# Patient Record
Sex: Male | Born: 1955 | Race: Black or African American | Hispanic: No | Marital: Single | Smoking: Former smoker
Health system: Southern US, Community
[De-identification: ages and names within clinical notes are randomized; demographics above are authoritative.]

## PROBLEM LIST (undated history)

## (undated) DIAGNOSIS — C801 Malignant (primary) neoplasm, unspecified: Secondary | ICD-10-CM

## (undated) DIAGNOSIS — J449 Chronic obstructive pulmonary disease, unspecified: Secondary | ICD-10-CM

## (undated) DIAGNOSIS — Z789 Other specified health status: Secondary | ICD-10-CM

## (undated) HISTORY — PX: NO PAST SURGERIES: SHX2092

---

## 1999-03-15 ENCOUNTER — Encounter: Payer: Self-pay | Admitting: Emergency Medicine

## 1999-03-15 ENCOUNTER — Emergency Department (HOSPITAL_COMMUNITY): Admission: EM | Admit: 1999-03-15 | Discharge: 1999-03-15 | Payer: Self-pay | Admitting: Emergency Medicine

## 2001-07-26 ENCOUNTER — Inpatient Hospital Stay (HOSPITAL_COMMUNITY): Admission: EM | Admit: 2001-07-26 | Discharge: 2001-08-05 | Payer: Self-pay

## 2008-10-28 ENCOUNTER — Emergency Department (HOSPITAL_COMMUNITY): Admission: EM | Admit: 2008-10-28 | Discharge: 2008-10-28 | Payer: Self-pay | Admitting: Emergency Medicine

## 2010-06-10 LAB — URINALYSIS, MICROSCOPIC ONLY
Bilirubin Urine: NEGATIVE
Glucose, UA: NEGATIVE mg/dL
Hgb urine dipstick: NEGATIVE
Ketones, ur: NEGATIVE mg/dL
Leukocytes, UA: NEGATIVE
Nitrite: NEGATIVE
Protein, ur: NEGATIVE mg/dL
Specific Gravity, Urine: 1.007 (ref 1.005–1.030)
Urobilinogen, UA: 1 mg/dL (ref 0.0–1.0)
pH: 5.5 (ref 5.0–8.0)

## 2010-06-10 LAB — RAPID URINE DRUG SCREEN, HOSP PERFORMED
Amphetamines: NOT DETECTED
Barbiturates: NOT DETECTED
Benzodiazepines: NOT DETECTED
Cocaine: POSITIVE — AB

## 2010-06-10 LAB — ETHANOL: Alcohol, Ethyl (B): 244 mg/dL — ABNORMAL HIGH (ref 0–10)
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
## 2019-04-17 ENCOUNTER — Other Ambulatory Visit: Payer: Self-pay

## 2019-04-17 ENCOUNTER — Emergency Department: Payer: Medicaid Other

## 2019-04-17 ENCOUNTER — Encounter: Payer: Self-pay | Admitting: Emergency Medicine

## 2019-04-17 ENCOUNTER — Inpatient Hospital Stay
Admission: EM | Admit: 2019-04-17 | Discharge: 2019-04-19 | DRG: 871 | Disposition: A | Discharge status: Home Health | Payer: Medicaid Other | Attending: Family Medicine | Admitting: Family Medicine

## 2019-04-17 DIAGNOSIS — A419 Sepsis, unspecified organism: Principal | ICD-10-CM | POA: Diagnosis present

## 2019-04-17 DIAGNOSIS — E43 Unspecified severe protein-calorie malnutrition: Secondary | ICD-10-CM | POA: Diagnosis present

## 2019-04-17 DIAGNOSIS — Z720 Tobacco use: Secondary | ICD-10-CM

## 2019-04-17 DIAGNOSIS — R0902 Hypoxemia: Secondary | ICD-10-CM

## 2019-04-17 DIAGNOSIS — J189 Pneumonia, unspecified organism: Secondary | ICD-10-CM | POA: Diagnosis present

## 2019-04-17 DIAGNOSIS — F1010 Alcohol abuse, uncomplicated: Secondary | ICD-10-CM | POA: Diagnosis present

## 2019-04-17 DIAGNOSIS — R6520 Severe sepsis without septic shock: Secondary | ICD-10-CM | POA: Diagnosis not present

## 2019-04-17 DIAGNOSIS — E872 Acidosis, unspecified: Secondary | ICD-10-CM | POA: Insufficient documentation

## 2019-04-17 DIAGNOSIS — J449 Chronic obstructive pulmonary disease, unspecified: Secondary | ICD-10-CM | POA: Insufficient documentation

## 2019-04-17 DIAGNOSIS — R05 Cough: Secondary | ICD-10-CM | POA: Diagnosis not present

## 2019-04-17 DIAGNOSIS — Z7289 Other problems related to lifestyle: Secondary | ICD-10-CM

## 2019-04-17 DIAGNOSIS — J441 Chronic obstructive pulmonary disease with (acute) exacerbation: Secondary | ICD-10-CM | POA: Diagnosis present

## 2019-04-17 DIAGNOSIS — Z23 Encounter for immunization: Secondary | ICD-10-CM

## 2019-04-17 DIAGNOSIS — F1410 Cocaine abuse, uncomplicated: Secondary | ICD-10-CM | POA: Diagnosis present

## 2019-04-17 DIAGNOSIS — J9601 Acute respiratory failure with hypoxia: Secondary | ICD-10-CM | POA: Diagnosis present

## 2019-04-17 DIAGNOSIS — R069 Unspecified abnormalities of breathing: Secondary | ICD-10-CM | POA: Diagnosis not present

## 2019-04-17 DIAGNOSIS — Z79899 Other long term (current) drug therapy: Secondary | ICD-10-CM

## 2019-04-17 DIAGNOSIS — Z87891 Personal history of nicotine dependence: Secondary | ICD-10-CM

## 2019-04-17 DIAGNOSIS — Z20822 Contact with and (suspected) exposure to covid-19: Secondary | ICD-10-CM | POA: Diagnosis present

## 2019-04-17 DIAGNOSIS — T68XXXA Hypothermia, initial encounter: Secondary | ICD-10-CM

## 2019-04-17 DIAGNOSIS — R0602 Shortness of breath: Secondary | ICD-10-CM | POA: Diagnosis not present

## 2019-04-17 HISTORY — DX: Other specified health status: Z78.9

## 2019-04-17 LAB — LACTIC ACID, PLASMA
Lactic Acid, Venous: 2 mmol/L (ref 0.5–1.9)
Lactic Acid, Venous: 1.4 mmol/L (ref 0.5–1.9)

## 2019-04-17 LAB — COMPREHENSIVE METABOLIC PANEL
ALT: 11 U/L (ref 0–44)
AST: 22 U/L (ref 15–41)
Albumin: 3.8 g/dL (ref 3.5–5.0)
Alkaline Phosphatase: 77 U/L (ref 38–126)
Anion gap: 16 — ABNORMAL HIGH (ref 5–15)
BUN: 10 mg/dL (ref 8–23)
CO2: 24 mmol/L (ref 22–32)
Calcium: 9.3 mg/dL (ref 8.9–10.3)
Chloride: 98 mmol/L (ref 98–111)
Creatinine, Ser: 0.85 mg/dL (ref 0.61–1.24)
GFR calc Af Amer: >60 mL/min (ref >60)
GFR calc non Af Amer: >60 mL/min (ref >60)
Glucose, Bld: 98 mg/dL (ref 70–99)
Potassium: 3.6 mmol/L (ref 3.5–5.1)
Sodium: 138 mmol/L (ref 135–145)
Total Bilirubin: 0.9 mg/dL (ref 0.3–1.2)
Total Protein: 8.1 g/dL (ref 6.5–8.1)

## 2019-04-17 LAB — CBC
HCT: 44.2 % (ref 39.0–52.0)
Hemoglobin: 14.8 g/dL (ref 13.0–17.0)
MCH: 28.9 pg (ref 26.0–34.0)
MCHC: 33.5 g/dL (ref 30.0–36.0)
MCV: 86.3 fL (ref 80.0–100.0)
Platelets: 312 10*3/uL (ref 150–400)
RBC: 5.12 MIL/uL (ref 4.22–5.81)
RDW: 12.7 % (ref 11.5–15.5)
WBC: 6.4 10*3/uL (ref 4.0–10.5)
nRBC: 0 % (ref 0.0–0.2)

## 2019-04-17 LAB — TROPONIN I (HIGH SENSITIVITY)
Troponin I (High Sensitivity): 3 ng/L (ref <18)
Troponin I (High Sensitivity): <2 ng/L (ref <18)

## 2019-04-17 LAB — POC SARS CORONAVIRUS 2 AG: SARS Coronavirus 2 Ag: NEGATIVE

## 2019-04-17 LAB — PHOSPHORUS: Phosphorus: 2.8 mg/dL (ref 2.5–4.6)

## 2019-04-17 LAB — TSH: TSH: 2.711 u[IU]/mL (ref 0.350–4.500)

## 2019-04-17 LAB — MAGNESIUM: Magnesium: 1.6 mg/dL — ABNORMAL LOW (ref 1.7–2.4)

## 2019-04-17 LAB — BRAIN NATRIURETIC PEPTIDE: B Natriuretic Peptide: 49 pg/mL (ref 0.0–100.0)

## 2019-04-17 MED ORDER — NICOTINE 14 MG/24HR TD PT24
14.0000 mg | MEDICATED_PATCH | Freq: Every day | TRANSDERMAL | Status: DC
  Filled 2019-04-17: qty 1

## 2019-04-17 MED ORDER — THIAMINE HCL 100 MG/ML IJ SOLN: 100.0000 mg | Freq: Every day | INTRAMUSCULAR | Status: DC

## 2019-04-17 MED ORDER — LORAZEPAM 1 MG PO TABS: 1.0000 mg | ORAL_TABLET | ORAL | Status: DC | PRN

## 2019-04-17 MED ORDER — ACETAMINOPHEN 650 MG RE SUPP: 650.0000 mg | Freq: Four times a day (QID) | RECTAL | Status: DC | PRN

## 2019-04-17 MED ORDER — AMMONIUM LACTATE 12 % EX LOTN
TOPICAL_LOTION | Freq: Two times a day (BID) | CUTANEOUS | Status: DC
  Administered 2019-04-19: 1 via TOPICAL
  Filled 2019-04-17: qty 400

## 2019-04-17 MED ORDER — ONDANSETRON HCL 4 MG/2ML IJ SOLN
4.0000 mg | Freq: Four times a day (QID) | INTRAMUSCULAR | Status: DC | PRN
  Administered 2019-04-17: 4 mg via INTRAVENOUS
  Filled 2019-04-17: qty 2

## 2019-04-17 MED ORDER — SODIUM CHLORIDE 0.9 % IV BOLUS
1000.0000 mL | Freq: Once | INTRAVENOUS | Status: AC
  Administered 2019-04-17: 1000 mL via INTRAVENOUS

## 2019-04-17 MED ORDER — THIAMINE HCL 100 MG PO TABS: 100.0000 mg | ORAL_TABLET | Freq: Every day | ORAL | Status: DC

## 2019-04-17 MED ORDER — IPRATROPIUM-ALBUTEROL 0.5-2.5 (3) MG/3ML IN SOLN
3.0000 mL | Freq: Four times a day (QID) | RESPIRATORY_TRACT | Status: DC
  Administered 2019-04-17 – 2019-04-19 (×5): 3 mL via RESPIRATORY_TRACT
  Filled 2019-04-17 (×7): qty 3

## 2019-04-17 MED ORDER — SODIUM CHLORIDE 0.9 % IV SOLN
2.0000 g | INTRAVENOUS | Status: DC
  Administered 2019-04-17: 2 g via INTRAVENOUS
  Filled 2019-04-17 (×2): qty 20

## 2019-04-17 MED ORDER — SODIUM CHLORIDE 0.9 % IV SOLN
2.0000 g | Freq: Once | INTRAVENOUS | Status: AC
  Administered 2019-04-17: 14:00:00 2 g via INTRAVENOUS
  Filled 2019-04-17: qty 2

## 2019-04-17 MED ORDER — AZITHROMYCIN 250 MG PO TABS
250.0000 mg | ORAL_TABLET | Freq: Every day | ORAL | Status: DC
  Administered 2019-04-18 – 2019-04-19 (×2): 250 mg via ORAL
  Filled 2019-04-17 (×2): qty 1

## 2019-04-17 MED ORDER — IPRATROPIUM-ALBUTEROL 0.5-2.5 (3) MG/3ML IN SOLN
3.0000 mL | Freq: Once | RESPIRATORY_TRACT | Status: AC
  Administered 2019-04-17: 3 mL via RESPIRATORY_TRACT
  Filled 2019-04-17: qty 3

## 2019-04-17 MED ORDER — VANCOMYCIN HCL 1750 MG/350ML IV SOLN
1750.0000 mg | Freq: Once | INTRAVENOUS | Status: AC
  Administered 2019-04-17: 1750 mg via INTRAVENOUS
  Filled 2019-04-17: qty 350

## 2019-04-17 MED ORDER — LORAZEPAM 2 MG/ML IJ SOLN: 1.0000 mg | INTRAMUSCULAR | Status: DC | PRN

## 2019-04-17 MED ORDER — ENOXAPARIN SODIUM 40 MG/0.4ML ~~LOC~~ SOLN
40.0000 mg | SUBCUTANEOUS | Status: DC
  Administered 2019-04-17 – 2019-04-18 (×2): 40 mg via SUBCUTANEOUS
  Filled 2019-04-17 (×2): qty 0.4

## 2019-04-17 MED ORDER — ADULT MULTIVITAMIN W/MINERALS CH: 1.0000 | ORAL_TABLET | Freq: Every day | ORAL | Status: DC

## 2019-04-17 MED ORDER — ONDANSETRON HCL 4 MG PO TABS: 4.0000 mg | ORAL_TABLET | Freq: Four times a day (QID) | ORAL | Status: DC | PRN

## 2019-04-17 MED ORDER — AZITHROMYCIN 500 MG PO TABS
500.0000 mg | ORAL_TABLET | Freq: Every day | ORAL | Status: AC
  Administered 2019-04-17: 500 mg via ORAL
  Filled 2019-04-17: qty 1

## 2019-04-17 MED ORDER — FOLIC ACID 1 MG PO TABS: 1.0000 mg | ORAL_TABLET | Freq: Every day | ORAL | Status: DC

## 2019-04-17 MED ORDER — SODIUM CHLORIDE 0.9 % IV SOLN: INTRAVENOUS | Status: DC

## 2019-04-17 MED ORDER — METHYLPREDNISOLONE SODIUM SUCC 125 MG IJ SOLR
125.0000 mg | Freq: Once | INTRAMUSCULAR | Status: AC
  Administered 2019-04-17: 125 mg via INTRAVENOUS
  Filled 2019-04-17: qty 2

## 2019-04-17 MED ORDER — ACETAMINOPHEN 325 MG PO TABS: 650.0000 mg | ORAL_TABLET | Freq: Four times a day (QID) | ORAL | Status: DC | PRN

## 2019-04-17 MED ORDER — METHYLPREDNISOLONE SODIUM SUCC 40 MG IJ SOLR
40.0000 mg | Freq: Two times a day (BID) | INTRAMUSCULAR | Status: DC
  Administered 2019-04-18 – 2019-04-19 (×3): 40 mg via INTRAVENOUS
  Filled 2019-04-17 (×3): qty 1

## 2019-04-17 NOTE — ED Notes (Signed)
Pt given meal tray.

## 2019-04-17 NOTE — ED Notes (Signed)
Bear warmer turned down to low. Pt temp 98.3 oral.

## 2019-04-17 NOTE — ED Triage Notes (Signed)
Pt presents to ED via [facility] from home c/o sob and productive of white sputum x1 month. Current smoker, pt denies any other medical hx. States has been using mucinex at home with no symptom relief.   Unable to obtain oral or axillary temp.

## 2019-04-17 NOTE — ED Notes (Signed)
This RN ambulated pt per MD order. Upon standing pt unsteady. This RN provided assistance. Pt stating he does not use an assistive device at home. While ambulating pt oxygen saturation dropped to 85-88%. Pt placed back in bed. Oxygen saturation increased to 95% once pt placed back in bed. MD made aware.

## 2019-04-17 NOTE — ED Notes (Signed)
Pt's mother, Baldo Ash: [phone].

## 2019-04-17 NOTE — ED Provider Notes (Signed)
[facility] Emergency Department Provider Note  ____________________________________________   First MD Initiated Contact with Patient 04/17/19 1217     (approximate)  I have reviewed the triage vital signs and the nursing notes.   HISTORY  Chief Complaint Shortness of Breath and Cough    HPI Devon Peterson is a 64 y.o. male here with shortness of breath and white sputum production.  The patient is a chronic smoker.  He reportedly has had cough and generalized weakness for the last month.  He has been trying over-the-counter medications such as Mucinex without relief.  He denies any known fevers.  He states that shortness of breath is worse with exertion.  He does not regularly use inhalers.  He states that he became more weak over the last 1 to 2 days.  No chest pain.  Unable to obtain oral temperature on arrival.        History reviewed. No pertinent past medical history.  There are no problems to display for this patient.   History reviewed. No pertinent surgical history.  Prior to Admission medications   Not on File    Allergies Patient has no known allergies.  History reviewed. No pertinent family history.  Social History Social History   Tobacco Use  . Smoking status: Current Every Day Smoker    Packs/day: 0.50    Types: Cigarettes  . Smokeless tobacco: Never Used  Substance Use Topics  . Alcohol use: Not on file  . Drug use: Not on file    Review of Systems  Review of Systems  Constitutional: Positive for fatigue. Negative for chills and fever.  HENT: Negative for sore throat.   Respiratory: Positive for cough, shortness of breath and wheezing.   Cardiovascular: Negative for chest pain.  Gastrointestinal: Positive for nausea. Negative for abdominal pain.  Genitourinary: Negative for flank pain.  Musculoskeletal: Negative for neck pain.  Skin: Negative for rash and wound.  Allergic/Immunologic: Negative for immunocompromised  state.  Neurological: Positive for weakness. Negative for numbness.  Hematological: Does not bruise/bleed easily.  All other systems reviewed and are negative.    ____________________________________________  PHYSICAL EXAM:      VITAL SIGNS: ED Triage Vitals  Enc Vitals Group     BP 04/17/19 1159 (!) 125/93     Pulse Rate 04/17/19 1159 (!) 126     Resp 04/17/19 1159 18     Temp 04/17/19 1212 (!) 94.5 F (34.7 C)     Temp Source 04/17/19 1212 Rectal     SpO2 04/17/19 1159 100 %     Weight 04/17/19 1149 156 lb (70.8 kg)     Height 04/17/19 1149 5\' 11"  (1.803 m)     Head Circumference --      Peak Flow --      Pain Score 04/17/19 1149 0     Pain Loc --      Pain Edu? --      Excl. in [place]? --      Physical Exam Vitals and nursing note reviewed.  Constitutional:      General: He is not in acute distress.    Appearance: He is well-developed.  HENT:     Head: Normocephalic and atraumatic.     Comments: Markedly dry mucous membranes Eyes:     Conjunctiva/sclera: Conjunctivae normal.  Cardiovascular:     Rate and Rhythm: Normal rate and regular rhythm.     Heart sounds: Normal heart sounds.  Pulmonary:  Effort: Tachypnea and respiratory distress present.     Breath sounds: Decreased air movement present. Examination of the right-upper field reveals wheezing. Examination of the left-upper field reveals wheezing. Examination of the right-middle field reveals wheezing. Examination of the left-middle field reveals wheezing. Examination of the right-lower field reveals wheezing. Examination of the left-lower field reveals wheezing. Wheezing present.  Abdominal:     General: There is no distension.  Musculoskeletal:     Cervical back: Neck supple.  Skin:    General: Skin is warm.     Capillary Refill: Capillary refill takes less than 2 seconds.     Findings: No rash.  Neurological:     Mental Status: He is alert and oriented to person, place, and time.     Motor: No  abnormal muscle tone.       ____________________________________________   LABS (all labs ordered are listed, but only abnormal results are displayed)  Labs Reviewed  COMPREHENSIVE METABOLIC PANEL - Abnormal; Notable for the following components:      Result Value   Anion gap 16 (*)    All other components within normal limits  LACTIC ACID, PLASMA - Abnormal; Notable for the following components:   Lactic Acid, Venous 2.0 (*)    All other components within normal limits  CULTURE, BLOOD (ROUTINE X 2)  CULTURE, BLOOD (ROUTINE X 2)  CBC  LACTIC ACID, PLASMA  TSH  BRAIN NATRIURETIC PEPTIDE  POC SARS CORONAVIRUS 2 AG -  ED  TROPONIN I (HIGH SENSITIVITY)  TROPONIN I (HIGH SENSITIVITY)    ____________________________________________  EKG: Sinus tachycardia, VR 123. PR 126, QRS 76, QTc 472. No acute St elevations or depressions.  ________________________________________  RADIOLOGY All imaging, including plain films, CT scans, and ultrasounds, independently reviewed by me, and interpretations confirmed via formal radiology reads.  ED MD interpretation:   CXR: COPD, emphysema, no acute abnormality  Official radiology report(s): DG Chest Portable 1 View  Result Date: 04/17/2019 CLINICAL DATA:  Shortness of breath. EXAM: PORTABLE CHEST 1 VIEW COMPARISON:  None. FINDINGS: No pneumothorax. The heart, hila, and mediastinum are normal. No nodules or masses are identified. No focal infiltrates. Hyper inflation of the lungs is noted. IMPRESSION: Hyperinflation of the lungs suggests COPD or emphysema. No acute abnormalities are seen. Electronically Signed   By: Dorise Bullion III M.D   On: 04/17/2019 12:34    ____________________________________________  PROCEDURES   Procedure(s) performed (including Critical Care):  Procedures  ____________________________________________  INITIAL IMPRESSION / MDM / [place] / ED COURSE  As part of my medical decision making, I  reviewed the following data within the [place] notes reviewed and incorporated, Old chart reviewed, Notes from prior ED visits, and [place] Controlled Substance Database       *Lakai Moree was evaluated in Emergency Department on 04/17/2019 for the symptoms described in the history of present illness. He was evaluated in the context of the global COVID-19 pandemic, which necessitated consideration that the patient might be at risk for infection with the SARS-CoV-2 virus that causes COVID-19. Institutional protocols and algorithms that pertain to the evaluation of patients at risk for COVID-19 are in a state of rapid change based on information released by regulatory bodies including the CDC and federal and state organizations. These policies and algorithms were followed during the patient's care in the ED.  Some ED evaluations and interventions may be delayed as a result of limited staffing during the pandemic.*     Medical  Decision Making:  64 yo M with h/o COPD here with cough, generalized weakness. Symptoms ongoing x 1 month but family was finally able to convince him to come today. Pt arrived hypothermic, hypoxic, and tachycardic - sepsis protocol started with broad-spectrum ABX. LA 2.0. IVF given. However, on further lab/assessment no apparent bacterial infection seen. CXR is clear. CMP unremarkable. He is diffusely wheezy and noted to have hypoxia to 85 when ambulating in ED.  Suspect COPD exacerbation with acute on chronic hypoxia, as well as failure to thrive. Steroids, breathing tx given.  ____________________________________________  FINAL CLINICAL IMPRESSION(S) / ED DIAGNOSES  Final diagnoses:  COPD exacerbation ([place])  Hypoxia     MEDICATIONS GIVEN DURING THIS VISIT:  Medications  vancomycin (VANCOREADY) IVPB 1750 mg/350 mL (1,750 mg Intravenous New Bag/Given 04/17/19 1433)  ipratropium-albuterol (DUONEB) 0.5-2.5 (3) MG/3ML nebulizer solution 3 mL (has no  administration in time range)  sodium chloride 0.9 % bolus 1,000 mL (0 mLs Intravenous Stopped 04/17/19 1548)  ceFEPIme (MAXIPIME) 2 g in sodium chloride 0.9 % 100 mL IVPB (0 g Intravenous Stopped 04/17/19 1433)  ipratropium-albuterol (DUONEB) 0.5-2.5 (3) MG/3ML nebulizer solution 3 mL (3 mLs Nebulization Given 04/17/19 1434)  methylPREDNISolone sodium succinate (SOLU-MEDROL) 125 mg/2 mL injection 125 mg (125 mg Intravenous Given 04/17/19 1434)     ED Discharge Orders    None       Note:  This document was prepared using Dragon voice recognition software and may include unintentional dictation errors.   Duffy Bruce, MD 04/17/19 [phone]

## 2019-04-17 NOTE — ED Notes (Signed)
Room and bed cleaned of trash, pt reminded to keep mask on - cough present  Belongings bagged - pt's watch placed into single red glove, pt admits "I only have one [glove]"

## 2019-04-17 NOTE — ED Notes (Signed)
Attempted to call report to the floor, was told they are unable to accept him at this time because they do not have a tele box.
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
## 2019-04-18 ENCOUNTER — Inpatient Hospital Stay: Payer: Medicaid Other

## 2019-04-18 LAB — BASIC METABOLIC PANEL
Anion gap: 12 (ref 5–15)
BUN: 15 mg/dL (ref 8–23)
CO2: 21 mmol/L — ABNORMAL LOW (ref 22–32)
Calcium: 8.4 mg/dL — ABNORMAL LOW (ref 8.9–10.3)
Chloride: 104 mmol/L (ref 98–111)
Creatinine, Ser: 0.89 mg/dL (ref 0.61–1.24)
GFR calc Af Amer: >60 mL/min (ref >60)
GFR calc non Af Amer: >60 mL/min (ref >60)
Glucose, Bld: 226 mg/dL — ABNORMAL HIGH (ref 70–99)
Potassium: 3.9 mmol/L (ref 3.5–5.1)
Sodium: 137 mmol/L (ref 135–145)

## 2019-04-18 LAB — CBC
HCT: 34 % — ABNORMAL LOW (ref 39.0–52.0)
Hemoglobin: 11.6 g/dL — ABNORMAL LOW (ref 13.0–17.0)
MCH: 29.3 pg (ref 26.0–34.0)
MCHC: 34.1 g/dL (ref 30.0–36.0)
MCV: 85.9 fL (ref 80.0–100.0)
Platelets: 254 10*3/uL (ref 150–400)
RBC: 3.96 MIL/uL — ABNORMAL LOW (ref 4.22–5.81)
RDW: 12.8 % (ref 11.5–15.5)
WBC: 7.3 10*3/uL (ref 4.0–10.5)
nRBC: 0 % (ref 0.0–0.2)

## 2019-04-18 LAB — URINE DRUG SCREEN, QUALITATIVE (ARMC ONLY)
Amphetamines, Ur Screen: NOT DETECTED
Barbiturates, Ur Screen: NOT DETECTED
Benzodiazepine, Ur Scrn: NOT DETECTED
Cannabinoid 50 Ng, Ur ~~LOC~~: NOT DETECTED
Cocaine Metabolite,Ur ~~LOC~~: POSITIVE — AB
MDMA (Ecstasy)Ur Screen: NOT DETECTED
Methadone Scn, Ur: NOT DETECTED
Opiate, Ur Screen: NOT DETECTED
Phencyclidine (PCP) Ur S: NOT DETECTED
Tricyclic, Ur Screen: NOT DETECTED

## 2019-04-18 LAB — HIV ANTIBODY (ROUTINE TESTING W REFLEX): HIV Screen 4th Generation wRfx: NONREACTIVE

## 2019-04-18 LAB — PROCALCITONIN: Procalcitonin: <0.1 ng/mL

## 2019-04-18 LAB — TSH: TSH: 2.015 u[IU]/mL (ref 0.350–4.500)

## 2019-04-18 LAB — SARS CORONAVIRUS 2 (TAT 6-24 HRS): SARS Coronavirus 2: NEGATIVE

## 2019-04-18 MED ORDER — SODIUM CHLORIDE 0.9 % IV SOLN
1.0000 g | INTRAVENOUS | Status: DC
  Administered 2019-04-18: 1 g via INTRAVENOUS
  Filled 2019-04-18 (×2): qty 10

## 2019-04-18 MED ORDER — MAGNESIUM OXIDE 400 (241.3 MG) MG PO TABS
400.0000 mg | ORAL_TABLET | Freq: Two times a day (BID) | ORAL | Status: DC
  Administered 2019-04-18 – 2019-04-19 (×3): 400 mg via ORAL
  Filled 2019-04-18 (×3): qty 1

## 2019-04-18 MED ORDER — PNEUMOCOCCAL VAC POLYVALENT 25 MCG/0.5ML IJ INJ
0.5000 mL | INJECTION | INTRAMUSCULAR | Status: AC
  Administered 2019-04-19: 0.5 mL via INTRAMUSCULAR
  Filled 2019-04-18: qty 0.5

## 2019-04-18 MED ORDER — INFLUENZA VAC SPLIT QUAD 0.5 ML IM SUSY
0.5000 mL | PREFILLED_SYRINGE | INTRAMUSCULAR | Status: DC
  Filled 2019-04-18 (×2): qty 0.5

## 2019-04-18 NOTE — ED Notes (Signed)
Meal tray and drinks provided per pt request.

## 2019-04-18 NOTE — Progress Notes (Signed)
PROGRESS NOTE    Devon Peterson  [id number] DOB: 1955-08-24 DOA: 04/17/2019 PCP: Patient, No Pcp Per      Brief Narrative:  Mr. Cleckler is a 64 y.o. M with hx smoking, otherwise no regular medical follow up who presented with URI symptoms for several days, now severe cough, fatigue, chills.  In the ER, COVID negative, CXR clear.  Wheezing on exam.  Admitted and started on empiric antibiotics and steroids.        Assessment & Plan:  Possible sepsis -Continue antibiotics -Check procal   COPD exacerbation -Continue IV steroids -Continue Duo-nebs -Continue azithromycin   Hypomagnesemia -Replete Mag  COVID ruled out PCR negative, no infiltrates, no hypoxia.  Polysubstance abuse Cocaine positive UDS, endorsed alcohol use.  No withdrawal symptoms overnight. -Okay to forego withdrawal protocol  Severe protein calorie malnutrition As evidenced by poor oral intake, reported weight loss, and severe loss of subcutaneous muscle mass and fat         Disposition: The patient was admitted with hypothermia, tachycardia and lactic acid 2 in the setting of suspected pneumonia.    I will discharge when he is mentating at baseline, weaned off oxygen, heart rate and respiratory rate are normal, and his antibiotics have been changed to oral regimen safely. PSI 108, risk class IV has 8-9% in hospital mortality.          MDM: The below labs and imaging reports were reviewed and summarized above.  Medication management as above.     DVT prophylaxis: Lovenox Code Status: FULL Family Communication: Sister's phone number in the chart is not operational    Consultants:     Procedures:     Antimicrobials:   Ceftriaxone  Azithromycin   Culture data:   2/12 blood cultures           Subjective: Patient is feeling somewhat better, still very weak, tired.  His appetite is poor.  He has had no fever overnight.  He is a little wheezy still, still  coughing.  Objective: Vitals:   04/18/19 0230 04/18/19 0300 04/18/19 0404 04/18/19 0746  BP: 121/86 (!) 135/95 140/90   Pulse: 93 (!) 108 93   Resp: (!) 21 (!) 22 20   Temp:   98.6 F (37 C)   TempSrc:   Oral   SpO2: 100% 97% 100% 100%  Weight:   44.5 kg   Height:   5\' 11"  (1.803 m)     Intake/Output Summary (Last 24 hours) at 04/18/2019 1338 Last data filed at 04/18/2019 0500 Gross per 24 hour  Intake 691.4 ml  Output 400 ml  Net 291.4 ml   Filed Weights   04/17/19 1149 04/18/19 0404  Weight: 70.8 kg 44.5 kg    Examination: General appearance: Cachectic adult male, alert and in no acute distress.  Lying in bed HEENT: Anicteric, conjunctiva pink, lids and lashes normal. No nasal deformity, discharge, epistaxis.  Lips dry, oropharynx tacky dry, no oral lesions, edentulous, hearing normal.   Skin: Warm and dry.   No suspicious rashes or lesions. Cardiac: Tachycardic, regular, nl S1-S2, no murmurs appreciated.  Capillary refill is brisk.  JVP not visible.  No LE edema.  Radial pulses 2+ and symmetric. Respiratory: Normal respiratory rate and rhythm.  Diminished bilaterally, some inspiratory rhonchi, no rales. Abdomen: Abdomen soft.  Scaphoid, no TTP or guarding. No ascites, distension, hepatosplenomegaly.   MSK: No large joint effusions.  Diffuse severe loss of subcutaneous muscle mass intact, thenar wasting, temporal wasting.  Left  hand has some old injury/deformity. Neuro: Awake and alert.  EOMI, moves all extremities. Speech fluent.    Psych: Sensorium intact and responding to questions, attention normal. Affect blunted.  Judgment and insight appear normal.    Data Reviewed: I have personally reviewed following labs and imaging studies:  CBC: Recent Labs  Lab 04/17/19 1211 04/18/19 0603  WBC 6.4 7.3  HGB 14.8 11.6*  HCT 44.2 34.0*  MCV 86.3 85.9  PLT 312 604   Basic Metabolic Panel: Recent Labs  Lab 04/17/19 1211 04/18/19 0603  NA 138 137  K 3.6 3.9  CL 98  104  CO2 24 21*  GLUCOSE 98 226*  BUN 10 15  CREATININE 0.85 0.89  CALCIUM 9.3 8.4*  MG 1.6*  --   PHOS 2.8  --    GFR: Estimated Creatinine Clearance: 53.5 mL/min (by C-G formula based on SCr of 0.89 mg/dL). Liver Function Tests: Recent Labs  Lab 04/17/19 1211  AST 22  ALT 11  ALKPHOS 77  BILITOT 0.9  PROT 8.1  ALBUMIN 3.8   No results for input(s): LIPASE, AMYLASE in the last 168 hours. No results for input(s): AMMONIA in the last 168 hours. Coagulation Profile: No results for input(s): INR, PROTIME in the last 168 hours. Cardiac Enzymes: No results for input(s): CKTOTAL, CKMB, CKMBINDEX, TROPONINI in the last 168 hours. BNP (last 3 results) No results for input(s): PROBNP in the last 8760 hours. HbA1C: No results for input(s): HGBA1C in the last 72 hours. CBG: No results for input(s): GLUCAP in the last 168 hours. Lipid Profile: No results for input(s): CHOL, HDL, LDLCALC, TRIG, CHOLHDL, LDLDIRECT in the last 72 hours. Thyroid Function Tests: Recent Labs    04/18/19 0603  TSH 2.015   Anemia Panel: No results for input(s): VITAMINB12, FOLATE, FERRITIN, TIBC, IRON, RETICCTPCT in the last 72 hours. Urine analysis:    Component Value Date/Time   COLORURINE YELLOW 10/28/2008 [place] 10/28/2008 0458   LABSPEC 1.007 10/28/2008 0458   PHURINE 5.5 10/28/2008 0458   GLUCOSEU NEGATIVE 10/28/2008 0458   HGBUR NEGATIVE 10/28/2008 0458   BILIRUBINUR NEGATIVE 10/28/2008 0458   KETONESUR NEGATIVE 10/28/2008 0458   PROTEINUR NEGATIVE 10/28/2008 0458   UROBILINOGEN 1.0 10/28/2008 0458   NITRITE NEGATIVE 10/28/2008 0458   LEUKOCYTESUR NEGATIVE 10/28/2008 0458   Sepsis Labs: @LABRCNTIP (procalcitonin:4,lacticacidven:4)  ) Recent Results (from the past 240 hour(s))  Blood culture (routine x 2)     Status: None (Preliminary result)   Collection Time: 04/17/19 12:11 PM   Specimen: BLOOD  Result Value Ref Range Status   Specimen Description BLOOD BLOOD  LEFT FOREARM  Final   Special Requests   Final    BOTTLES DRAWN AEROBIC AND ANAEROBIC Blood Culture results may not be optimal due to an inadequate volume of blood received in culture bottles   Culture   Final    NO GROWTH < 24 HOURS Performed at [facility], [address] [place], [place] [address]    Report Status PENDING  Incomplete  Blood culture (routine x 2)     Status: None (Preliminary result)   Collection Time: 04/17/19 12:11 PM   Specimen: BLOOD  Result Value Ref Range Status   Specimen Description BLOOD LEFT ANTECUBITAL  Final   Special Requests   Final    BOTTLES DRAWN AEROBIC AND ANAEROBIC Blood Culture adequate volume   Culture   Final    NO GROWTH < 24 HOURS Performed at [facility], [address] [place], [place] [address]    Report Status PENDING  Incomplete  SARS CORONAVIRUS 2 (TAT 6-24 HRS) Nasopharyngeal Nasopharyngeal Swab     Status: None   Collection Time: 04/17/19  6:28 PM   Specimen: Nasopharyngeal Swab  Result Value Ref Range Status   SARS Coronavirus 2 NEGATIVE NEGATIVE Final    Comment: (NOTE) SARS-CoV-2 target nucleic acids are NOT DETECTED. The SARS-CoV-2 RNA is generally detectable in upper and lower respiratory specimens during the acute phase of infection. Negative results do not preclude SARS-CoV-2 infection, do not rule out co-infections with other pathogens, and should not be used as the sole basis for treatment or other patient management decisions. Negative results must be combined with clinical observations, patient history, and epidemiological information. The expected result is Negative. Fact Sheet for Patients: SugarRoll.be Fact Sheet for Healthcare Providers: https://www.woods-mathews.com/ This test is not yet approved or cleared by the [place] FDA and  has been authorized for detection and/or diagnosis of SARS-CoV-2 by FDA under an Emergency Use Authorization  (EUA). This EUA will remain  in effect (meaning this test can be used) for the duration of the COVID-19 declaration under Section 56 4(b)(1) of the Act, 21 U.S.C. section 360bbb-3(b)(1), unless the authorization is terminated or revoked sooner. Performed at [facility], [place] [address], [place], [place] [address]          Radiology Studies: DG Chest Port 1 View  Result Date: 04/18/2019 CLINICAL DATA:  Hypothermia. Shortness of breath and cough. History of smoking. EXAM: PORTABLE CHEST 1 VIEW COMPARISON:  04/17/2019 FINDINGS: The cardiomediastinal silhouette is unchanged with normal heart size. The lungs remain hyperinflated with similar interstitial coarsening. Nipple shadows are noted bilaterally. No acute airspace consolidation, edema, pleural effusion, pneumothorax is identified. IMPRESSION: COPD without evidence of acute cardiopulmonary process. Electronically Signed   By: Logan Bores M.D.   On: 04/18/2019 09:47   DG Chest Portable 1 View  Result Date: 04/17/2019 CLINICAL DATA:  Shortness of breath. EXAM: PORTABLE CHEST 1 VIEW COMPARISON:  None. FINDINGS: No pneumothorax. The heart, hila, and mediastinum are normal. No nodules or masses are identified. No focal infiltrates. Hyper inflation of the lungs is noted. IMPRESSION: Hyperinflation of the lungs suggests COPD or emphysema. No acute abnormalities are seen. Electronically Signed   By: Dorise Bullion III M.D   On: 04/17/2019 12:34        Scheduled Meds: . ammonium lactate   Topical BID  . azithromycin  250 mg Oral Daily  . enoxaparin (LOVENOX) injection  40 mg Subcutaneous Q24H  . [START ON 04/19/2019] influenza vac split quadrivalent PF  0.5 mL Intramuscular Tomorrow-1000  . ipratropium-albuterol  3 mL Nebulization Q6H  . magnesium oxide  400 mg Oral BID  . methylPREDNISolone (SOLU-MEDROL) injection  40 mg Intravenous Q12H  . nicotine  14 mg Transdermal Daily  . [START ON 04/19/2019] pneumococcal 23 valent vaccine   0.5 mL Intramuscular Tomorrow-1000   Continuous Infusions:    LOS: 1 day    Time spent: 25 minutes    Edwin Dada, MD [facility] 04/18/2019, 1:38 PM     Please page though [place] or Epic secure chat:  For [employer], Adult nurse

## 2019-04-18 NOTE — Progress Notes (Deleted)
Patient refused bed alarm. Educated patient on importance of bed alarm for fall prevention but patient is still refusing bed alarm at this time.  04/18/2019 2:04 PM Marry Guan, RN

## 2019-04-19 LAB — CBC
HCT: 33.3 % — ABNORMAL LOW (ref 39.0–52.0)
Hemoglobin: 11.5 g/dL — ABNORMAL LOW (ref 13.0–17.0)
MCH: 29.3 pg (ref 26.0–34.0)
MCHC: 34.5 g/dL (ref 30.0–36.0)
MCV: 84.9 fL (ref 80.0–100.0)
Platelets: 232 10*3/uL (ref 150–400)
RBC: 3.92 MIL/uL — ABNORMAL LOW (ref 4.22–5.81)
RDW: 12.6 % (ref 11.5–15.5)
WBC: 10 10*3/uL (ref 4.0–10.5)
nRBC: 0 % (ref 0.0–0.2)

## 2019-04-19 LAB — BASIC METABOLIC PANEL
Anion gap: 8 (ref 5–15)
BUN: 9 mg/dL (ref 8–23)
CO2: 27 mmol/L (ref 22–32)
Calcium: 8.6 mg/dL — ABNORMAL LOW (ref 8.9–10.3)
Chloride: 103 mmol/L (ref 98–111)
Creatinine, Ser: 0.79 mg/dL (ref 0.61–1.24)
GFR calc Af Amer: >60 mL/min (ref >60)
GFR calc non Af Amer: >60 mL/min (ref >60)
Glucose, Bld: 146 mg/dL — ABNORMAL HIGH (ref 70–99)
Potassium: 3.6 mmol/L (ref 3.5–5.1)
Sodium: 138 mmol/L (ref 135–145)

## 2019-04-19 LAB — PROCALCITONIN: Procalcitonin: <0.1 ng/mL

## 2019-04-19 MED ORDER — CEFDINIR 300 MG PO CAPS: 300.0000 mg | ORAL_CAPSULE | Freq: Two times a day (BID) | ORAL | 0 refills | Status: DC

## 2019-04-19 MED ORDER — AZITHROMYCIN 250 MG PO TABS: 250.0000 mg | ORAL_TABLET | Freq: Every day | ORAL | 0 refills | Status: DC

## 2019-04-19 MED ORDER — SODIUM CHLORIDE 0.9 % IV SOLN
1.0000 g | INTRAVENOUS | Status: DC
  Administered 2019-04-19: 1 g via INTRAVENOUS
  Filled 2019-04-19: qty 1

## 2019-04-19 MED ORDER — SODIUM CHLORIDE 0.9 % IV SOLN
INTRAVENOUS | Status: DC | PRN
  Administered 2019-04-19: 20 mL via INTRAVENOUS

## 2019-04-19 MED ORDER — PREDNISONE 20 MG PO TABS: 40.0000 mg | ORAL_TABLET | Freq: Every day | ORAL | 0 refills | Status: AC

## 2019-04-19 NOTE — Progress Notes (Signed)
Physical Therapy Evaluation Patient Details Name: Devon Peterson MRN: [id number] DOB: [birth year] Today's Date: 04/19/2019   History of Present Illness  er MD note:Monty Deleo  is a 64 y.o. male coming in with cough and not being able to sleep.  He states he had some chills and some weight loss and feeling very fatigued.  Also has a runny nose.  Clinical Impression  Patient agrees to PT evaluation. He requires MI for bed mobiltiy, MI with RW for sit to stand transfer, MI with RW for gait 60 feet . He lives alone and reports not using a RW previously. He has no deficits with seated and standing balance and his BLE is WLF. He will benefit from skilled PT to improve mobility and strength.   Follow Up Recommendations Home health PT    Equipment Recommendations  Rolling walker with 5" wheels    Recommendations for Other Services       Precautions / Restrictions Precautions Precautions: None Restrictions Weight Bearing Restrictions: No      Mobility  Bed Mobility Overal bed mobility: Modified Independent                Transfers Overall transfer level: Modified independent Equipment used: Rolling walker (2 wheeled)             General transfer comment: VC for hand placement  Ambulation/Gait Ambulation/Gait assistance: Modified independent (Device/Increase time) Gait Distance (Feet): 60 Feet Assistive device: Rolling walker (2 wheeled) Gait Pattern/deviations: Step-to pattern        Stairs            Wheelchair Mobility    Modified Rankin (Stroke Patients Only)       Balance Overall balance assessment: Modified Independent                                           Pertinent Vitals/Pain Pain Assessment: No/denies pain    Home Living Family/patient expects to be discharged to:: Private residence Living Arrangements: Alone Available Help at Discharge: Family Type of Home: House Home Access: Stairs to enter Entrance Stairs-Rails:  Right Entrance Stairs-Number of Steps: 3 Home Layout: One level        Prior Function Level of Independence: Independent               Hand Dominance        Extremity/Trunk Assessment        Lower Extremity Assessment Lower Extremity Assessment: Overall WFL for tasks assessed(3/ 5 BLE hip flex, knee ext)       Communication   Communication: No difficulties  Cognition Arousal/Alertness: Awake/alert Behavior During Therapy: WFL for tasks assessed/performed;Flat affect Overall Cognitive Status: Within Functional Limits for tasks assessed                                        General Comments      Exercises     Assessment/Plan    PT Assessment Patient needs continued PT services  PT Problem List Decreased strength;Decreased mobility       PT Treatment Interventions Gait training;Therapeutic activities;Therapeutic exercise    PT Goals (Current goals can be found in the Care Plan section)  Acute Rehab PT Goals Patient Stated Goal: no goals stated PT Goal Formulation: Patient unable to participate in goal setting  Time For Goal Achievement: 05/03/19 Potential to Achieve Goals: Good    Frequency Min 2X/week   Barriers to discharge        Co-evaluation               AM-PAC PT "6 Clicks" Mobility  Outcome Measure Help needed turning from your back to your side while in a flat bed without using bedrails?: A Little Help needed moving from lying on your back to sitting on the side of a flat bed without using bedrails?: A Little Help needed moving to and from a bed to a chair (including a wheelchair)?: A Little Help needed standing up from a chair using your arms (e.g., wheelchair or bedside chair)?: A Little Help needed to walk in hospital room?: A Little Help needed climbing 3-5 steps with a railing? : A Little 6 Click Score: 18    End of Session Equipment Utilized During Treatment: Gait belt Activity Tolerance: Patient  tolerated treatment well Patient left: in bed;with bed alarm set Nurse Communication: Mobility status PT Visit Diagnosis: Muscle weakness (generalized) (M62.81);Difficulty in walking, not elsewhere classified (R26.2)    Time: 2563-8937 PT Time Calculation (min) (ACUTE ONLY): 20 min   Charges:   PT Evaluation $PT Eval Low Complexity: 1 Low PT Treatments $Gait Training: 8-22 mins          Alanson Puls, PT DPT 04/19/2019, 10:55 AM

## 2019-04-19 NOTE — TOC Initial Note (Addendum)
Transition of Care [facility]) - Initial/Assessment Note    Patient Details  Name: Devon Peterson MRN: [id number] Date of Birth: 14-May-1955  Transition of [facility]) CM/SW Contact:    Elliot Gurney [place], [place] Phone Number: 04/19/2019, 12:00 PM  Clinical Narrative:                 Patient is a 64 year old male who admitted to the hospital with a cough and not being able to sleep. Patient states that he lives in a single family home with his mother and brother. [facility] [place]. Patient's nephew Destin will be transporting patient home today. Patient will discharge home with [facility] PT and OT, This social worker currently waiting on a return call from a [facility] agency that will accept patient.  [facility], [facility], [facility], [facility] have declined patient. Phone call from [facility] pending regarding decision. Patient provided with a rolling walker, Brad from [facility] notified.    Patient and his nephew Destin [phone] informed that he will need to contact  his primary care provider to schedule an appointment as soon as possible for a follow up appointment and referral for [facility] services. This social worker made several attempts to call [facility], urgent care (patient's primary care provider confirmed through [facility]) however could not get through form the number provided.   Patient and patient's nephew agreed to schedule follow up with his primary care doctor.  Late Entry-received message that [facility] declined patient for [facility] services as well.  Murel Wigle, LCSW Clinical Social Work [phone]   Expected Discharge Plan: [place] Barriers to Discharge: No Barriers Identified   Patient Goals and CMS Choice Patient states their goals for this hospitalization and ongoing recovery are:: "I want to get sronger" CMS Medicare.gov Compare Post Acute Care list provided to:: Patient Choice offered to / list presented to : Patient  Expected Discharge  Plan and Services Expected Discharge Plan: [place] In-house Referral: Clinical Social Work     Living arrangements for the past 2 months: Single Family Home Expected Discharge Date: 04/19/19                         HH Arranged: PT, OT          Prior Living Arrangements/Services Living arrangements for the past 2 months: Single Family Home Lives with:: Parents, Siblings(per patient, he resides with mother and brother) Patient language and need for interpreter reviewed:: Yes Do you feel safe going back to the place where you live?: Yes      Need for Family Participation in Patient Care: Yes (Comment) Care giver support system in place?: Yes (comment)   Criminal Activity/Legal Involvement Pertinent to Current Situation/Hospitalization: No - Comment as needed  Activities of Daily Living Home Assistive Devices/Equipment: None ADL Screening (condition at time of admission) Patient's cognitive ability adequate to safely complete daily activities?: Yes Is the patient deaf or have difficulty hearing?: No Does the patient have difficulty seeing, even when wearing glasses/contacts?: No Does the patient have difficulty concentrating, remembering, or making decisions?: No Patient able to express need for assistance with ADLs?: Yes Does the patient have difficulty dressing or bathing?: No Independently performs ADLs?: Yes (appropriate for developmental age) Does the patient have difficulty walking or climbing stairs?: Yes Weakness of Legs: Both Weakness of Arms/Hands: Both  Permission Sought/Granted      Share Information with NAME: Destin  Permission granted to share info w Relationship: nephew  Permission granted to share info w Contact Information: [phone]  Emotional Assessment Appearance:: Appears older than stated age Attitude/Demeanor/Rapport: Engaged Affect (typically observed): Accepting Orientation: : Oriented to Self, Oriented to Place,  Oriented to Situation Alcohol / Substance Use: Not Applicable Psych Involvement: No (comment)  Admission diagnosis:  Hypothermia [T68.XXXA] Hypoxia [R09.02] COPD exacerbation ([place]) [J44.1] Sepsis ([place]) [A41.9] Patient Active Problem List   Diagnosis Date Noted  . Sepsis ([place]) 04/17/2019  . COPD exacerbation ([place])   . Lactic acidosis   . Tobacco abuse   . Alcohol abuse    PCP:  Patient, No Pcp Per Pharmacy:   [id number] [place], [address] [place] [address] Phone: [phone] Fax: [phone]     Social Determinants of Health (SDOH) Interventions    Readmission Risk Interventions No flowsheet data found.

## 2019-04-19 NOTE — Discharge Summary (Signed)
Physician Discharge Summary  Devon Peterson [id number] DOB: June 15, 1955 DOA: 04/17/2019  PCP: Patient, No Pcp Per  Admit date: 04/17/2019 Discharge date: 04/19/2019  Admitted From: Home  Disposition:  Home with [facility]   Recommendations for Outpatient Follow-up:  1. Follow up with PCP in 1-2 weeks 2. Please obtain BMP/CBC in one week     Home Health: PT/OT due to patient's fatigue and weakness making it unsafe to leave home alone  Equipment/Devices: Walker  Discharge Condition: Fair  CODE STATUS: FULL Diet recommendation: Regular  Brief/Interim Summary: Devon Peterson is a 64 y.o. M with hx smoking, otherwise no regular medical follow up who presented with URI symptoms for several days, now severe cough, fatigue, chills.  In the ER, COVID negative, CXR clear.  Wheezing on exam.  Admitted and started on empiric antibiotics and steroids.     PRINCIPAL HOSPITAL DIAGNOSIS: Pneumonia with sepsis    Discharge Diagnoses:  Suspected sepsis from pneumonia Patient was admitted and started on empiric antibiotics.  Patient now mentating at baseline, taking orals.  Temp < 100 F, heart rate < 100bpm, RR < 24, SpO2 at baseline.   Stable for discharge.    Discharged to complete 5 days antibiotics with azithromycin and cefdinir.    COPD exacerbation Admitted and treated with prednisone, azithromycin.  Discharged to complete 5 days of prednisone.  Hypomagnesemia Repleted  COVID ruled out  Polysubstance abuse Cocaine positive UDS, endorsed alcohol use.  No withdrawal symptoms overnight.  Severe protein calorie malnutrition As evidenced by poor oral intake, reported weight loss, and severe loss of subcutaneous muscle mass and fat              Discharge Instructions  Discharge Instructions    Discharge instructions   Complete by: As directed    From Dr. Loleta Books: You were admitted with pneumonia  YOu should continue and finish treatment with antibiotics and  prednisone: Take cefdinir/Omnicef 300 mg twice daily for 3 days starting Monday morning Take azithromycin 250 mg once daily in the morning for 3 days starting Monday  Take prednisone 40 mg (2 tabs) once daily in the morning for 2 days starting Monday  Use the green flutter valve a few times per day for the next week  Take Ensure or another over the counter nutrition shake 2 or 3 times daily.  Follow up with your primary care doctor AS SOON AS POSSIBLE, call them Monday morning   Increase activity slowly   Complete by: As directed      Allergies as of 04/19/2019   No Known Allergies     Medication List    TAKE these medications   acetaminophen 325 MG tablet Commonly known as: TYLENOL Take 650 mg by mouth every 6 (six) hours as needed.   azithromycin 250 MG tablet Commonly known as: ZITHROMAX Take 1 tablet (250 mg total) by mouth daily.   cefdinir 300 MG capsule Commonly known as: OMNICEF Take 1 capsule (300 mg total) by mouth 2 (two) times daily.   guaiFENesin 600 MG 12 hr tablet Commonly known as: MUCINEX Take 600 mg by mouth 2 (two) times daily.   predniSONE 20 MG tablet Commonly known as: Deltasone Take 2 tablets (40 mg total) by mouth daily for 2 days.            Durable Medical Equipment  (From admission, onward)         Start     Ordered   04/19/19 1124  DME Walker rolling  (Discharge  Planning)  Once    Question Answer Comment  Walker: With [place]   Patient needs a walker to treat with the following condition Pneumonia      04/19/19 1123         Follow-up Information    Your PCP Follow up.   Why: Use the phone number on the back of your Medicaid card to call your primary care doctor for a follow up asap Contact information: Call your primary care doctor         No Known Allergies  Consultations:     Procedures/Studies: DG Chest Port 1 View  Result Date: 04/18/2019 CLINICAL DATA:  Hypothermia. Shortness of breath and cough.  History of smoking. EXAM: PORTABLE CHEST 1 VIEW COMPARISON:  04/17/2019 FINDINGS: The cardiomediastinal silhouette is unchanged with normal heart size. The lungs remain hyperinflated with similar interstitial coarsening. Nipple shadows are noted bilaterally. No acute airspace consolidation, edema, pleural effusion, pneumothorax is identified. IMPRESSION: COPD without evidence of acute cardiopulmonary process. Electronically Signed   By: Logan Bores M.D.   On: 04/18/2019 09:47   DG Chest Portable 1 View  Result Date: 04/17/2019 CLINICAL DATA:  Shortness of breath. EXAM: PORTABLE CHEST 1 VIEW COMPARISON:  None. FINDINGS: No pneumothorax. The heart, hila, and mediastinum are normal. No nodules or masses are identified. No focal infiltrates. Hyper inflation of the lungs is noted. IMPRESSION: Hyperinflation of the lungs suggests COPD or emphysema. No acute abnormalities are seen. Electronically Signed   By: Dorise Bullion III M.D   On: 04/17/2019 12:34      Subjective: Patient still somewhat weak, but his breathing is better, he has had no confusion, fever, respiratory distress, chest pain.  Discharge Exam: Vitals:   04/19/19 0452 04/19/19 0806  BP: 126/87   Pulse: 82   Resp: 16   Temp: 98.1 F (36.7 C)   SpO2: 99% 99%   Vitals:   04/18/19 1917 04/18/19 2120 04/19/19 0452 04/19/19 0806  BP: (!) 139/98  126/87   Pulse: 88  82   Resp: 16  16   Temp: 98.1 F (36.7 C)  98.1 F (36.7 C)   TempSrc: Oral  Oral   SpO2: 99% 95% 99% 99%  Weight:      Height:        General: Pt is alert, awake, not in acute distress, appears cachectic Cardiovascular: RRR, nl S1-S2, no murmurs appreciated.   No LE edema.   Respiratory: Normal respiratory rate and rhythm.  CTAB without rales or wheezes. Abdominal: Abdomen soft and non-tender.  No distension or HSM.   Neuro/Psych: Strength symmetric in upper and lower extremities.  Judgment and insight appear normal.   The results of significant diagnostics  from this hospitalization (including imaging, microbiology, ancillary and laboratory) are listed below for reference.     Microbiology: Recent Results (from the past 240 hour(s))  Blood culture (routine x 2)     Status: None (Preliminary result)   Collection Time: 04/17/19 12:11 PM   Specimen: BLOOD  Result Value Ref Range Status   Specimen Description BLOOD BLOOD LEFT FOREARM  Final   Special Requests   Final    BOTTLES DRAWN AEROBIC AND ANAEROBIC Blood Culture results may not be optimal due to an inadequate volume of blood received in culture bottles   Culture   Final    NO GROWTH 2 DAYS Performed at [facility], [address], [place], [place] [address]    Report Status PENDING  Incomplete  Blood culture (routine x 2)     Status: None (Preliminary result)   Collection Time: 04/17/19 12:11 PM   Specimen: BLOOD  Result Value Ref Range Status   Specimen Description BLOOD LEFT ANTECUBITAL  Final   Special Requests   Final    BOTTLES DRAWN AEROBIC AND ANAEROBIC Blood Culture adequate volume   Culture   Final    NO GROWTH 2 DAYS Performed at [facility], [address] [place], [place] [address]    Report Status PENDING  Incomplete  SARS CORONAVIRUS 2 (TAT 6-24 HRS) Nasopharyngeal Nasopharyngeal Swab     Status: None   Collection Time: 04/17/19  6:28 PM   Specimen: Nasopharyngeal Swab  Result Value Ref Range Status   SARS Coronavirus 2 NEGATIVE NEGATIVE Final    Comment: (NOTE) SARS-CoV-2 target nucleic acids are NOT DETECTED. The SARS-CoV-2 RNA is generally detectable in upper and lower respiratory specimens during the acute phase of infection. Negative results do not preclude SARS-CoV-2 infection, do not rule out co-infections with other pathogens, and should not be used as the sole basis for treatment or other patient management decisions. Negative results must be combined with clinical observations, patient history, and epidemiological information.  The expected result is Negative. Fact Sheet for Patients: SugarRoll.be Fact Sheet for Healthcare Providers: https://www.woods-mathews.com/ This test is not yet approved or cleared by the [place] FDA and  has been authorized for detection and/or diagnosis of SARS-CoV-2 by FDA under an Emergency Use Authorization (EUA). This EUA will remain  in effect (meaning this test can be used) for the duration of the COVID-19 declaration under Section 56 4(b)(1) of the Act, 21 U.S.C. section 360bbb-3(b)(1), unless the authorization is terminated or revoked sooner. Performed at [facility], [place] [address] [place], [place] [address]      Labs: BNP (last 3 results) Recent Labs    04/17/19 1211  BNP 48.5   Basic Metabolic Panel: Recent Labs  Lab 04/17/19 1211 04/18/19 0603 04/19/19 0524  NA 138 137 138  K 3.6 3.9 3.6  CL 98 104 103  CO2 24 21* 27  GLUCOSE 98 226* 146*  BUN 10 15 9   CREATININE 0.85 0.89 0.79  CALCIUM 9.3 8.4* 8.6*  MG 1.6*  --   --   PHOS 2.8  --   --    Liver Function Tests: Recent Labs  Lab 04/17/19 1211  AST 22  ALT 11  ALKPHOS 77  BILITOT 0.9  PROT 8.1  ALBUMIN 3.8   No results for input(s): LIPASE, AMYLASE in the last 168 hours. No results for input(s): AMMONIA in the last 168 hours. CBC: Recent Labs  Lab 04/17/19 1211 04/18/19 0603 04/19/19 0524  WBC 6.4 7.3 10.0  HGB 14.8 11.6* 11.5*  HCT 44.2 34.0* 33.3*  MCV 86.3 85.9 84.9  PLT 312 254 232   Cardiac Enzymes: No results for input(s): CKTOTAL, CKMB, CKMBINDEX, TROPONINI in the last 168 hours. BNP: Invalid input(s): POCBNP CBG: No results for input(s): GLUCAP in the last 168 hours. D-Dimer No results for input(s): DDIMER in the last 72 hours. Hgb A1c No results for input(s): HGBA1C in the last 72 hours. Lipid Profile No results for input(s): CHOL, HDL, LDLCALC, TRIG, CHOLHDL, LDLDIRECT in the last 72 hours. Thyroid function  studies Recent Labs    04/18/19 0603  TSH 2.015   Anemia work up No results for input(s): VITAMINB12, FOLATE, FERRITIN, TIBC, IRON, RETICCTPCT in the last 72 hours. Urinalysis  Component Value Date/Time   COLORURINE YELLOW 10/28/2008 0458   APPEARANCEUR CLEAR 10/28/2008 0458   LABSPEC 1.007 10/28/2008 0458   PHURINE 5.5 10/28/2008 0458   GLUCOSEU NEGATIVE 10/28/2008 0458   HGBUR NEGATIVE 10/28/2008 0458   BILIRUBINUR NEGATIVE 10/28/2008 0458   KETONESUR NEGATIVE 10/28/2008 0458   PROTEINUR NEGATIVE 10/28/2008 0458   UROBILINOGEN 1.0 10/28/2008 0458   NITRITE NEGATIVE 10/28/2008 0458   LEUKOCYTESUR NEGATIVE 10/28/2008 0458   Sepsis Labs Invalid input(s): PROCALCITONIN,  WBC,  LACTICIDVEN Microbiology Recent Results (from the past 240 hour(s))  Blood culture (routine x 2)     Status: None (Preliminary result)   Collection Time: 04/17/19 12:11 PM   Specimen: BLOOD  Result Value Ref Range Status   Specimen Description BLOOD BLOOD LEFT FOREARM  Final   Special Requests   Final    BOTTLES DRAWN AEROBIC AND ANAEROBIC Blood Culture results may not be optimal due to an inadequate volume of blood received in culture bottles   Culture   Final    NO GROWTH 2 DAYS Performed at [facility], [address], [place], [place] [address]    Report Status PENDING  Incomplete  Blood culture (routine x 2)     Status: None (Preliminary result)   Collection Time: 04/17/19 12:11 PM   Specimen: BLOOD  Result Value Ref Range Status   Specimen Description BLOOD LEFT ANTECUBITAL  Final   Special Requests   Final    BOTTLES DRAWN AEROBIC AND ANAEROBIC Blood Culture adequate volume   Culture   Final    NO GROWTH 2 DAYS Performed at [facility], [address], [place], [place] [address]    Report Status PENDING  Incomplete  SARS CORONAVIRUS 2 (TAT 6-24 HRS) Nasopharyngeal Nasopharyngeal Swab     Status: None   Collection Time: 04/17/19  6:28 PM   Specimen:  Nasopharyngeal Swab  Result Value Ref Range Status   SARS Coronavirus 2 NEGATIVE NEGATIVE Final    Comment: (NOTE) SARS-CoV-2 target nucleic acids are NOT DETECTED. The SARS-CoV-2 RNA is generally detectable in upper and lower respiratory specimens during the acute phase of infection. Negative results do not preclude SARS-CoV-2 infection, do not rule out co-infections with other pathogens, and should not be used as the sole basis for treatment or other patient management decisions. Negative results must be combined with clinical observations, patient history, and epidemiological information. The expected result is Negative. Fact Sheet for Patients: SugarRoll.be Fact Sheet for Healthcare Providers: https://www.woods-mathews.com/ This test is not yet approved or cleared by the [place] FDA and  has been authorized for detection and/or diagnosis of SARS-CoV-2 by FDA under an Emergency Use Authorization (EUA). This EUA will remain  in effect (meaning this test can be used) for the duration of the COVID-19 declaration under Section 56 4(b)(1) of the Act, 21 U.S.C. section 360bbb-3(b)(1), unless the authorization is terminated or revoked sooner. Performed at [facility], [place] [address], [place], [place] [address]      Time coordinating discharge: 25 minutes      SIGNED:   Edwin Dada, MD  [facility] 04/19/2019, 11:24 AM

## 2019-04-19 NOTE — Progress Notes (Signed)
Devon Peterson to be D/C'd Home per MD order.  Discussed prescriptions and follow up appointments with the patient. Prescriptions given to patient, medication list explained in detail. Pt verbalized understanding.  Allergies as of 04/19/2019   No Known Allergies     Medication List    TAKE these medications   acetaminophen 325 MG tablet Commonly known as: TYLENOL Take 650 mg by mouth every 6 (six) hours as needed.   azithromycin 250 MG tablet Commonly known as: ZITHROMAX Take 1 tablet (250 mg total) by mouth daily.   cefdinir 300 MG capsule Commonly known as: OMNICEF Take 1 capsule (300 mg total) by mouth 2 (two) times daily.   guaiFENesin 600 MG 12 hr tablet Commonly known as: MUCINEX Take 600 mg by mouth 2 (two) times daily.   predniSONE 20 MG tablet Commonly known as: Deltasone Take 2 tablets (40 mg total) by mouth daily for 2 days.            Durable Medical Equipment  (From admission, onward)         Start     Ordered   04/19/19 1124  DME Walker rolling  (Discharge Planning)  Once    Question Answer Comment  Walker: With [place]   Patient needs a walker to treat with the following condition Pneumonia      04/19/19 1123          Vitals:   04/19/19 0452 04/19/19 0806  BP: 126/87   Pulse: 82   Resp: 16   Temp: 98.1 F (36.7 C)   SpO2: 99% 99%    Skin clean, dry and intact without evidence of skin break down, no evidence of skin tears noted. IV catheter discontinued intact. Site without signs and symptoms of complications. Dressing and pressure applied. Pt denies pain at this time. No complaints noted.  An After Visit Summary was printed and given to the patient. Patient escorted via [place], and D/C home via private auto.  Marry Guan 04/19/2019 2:41 PM

## 2019-04-22 LAB — CULTURE, BLOOD (ROUTINE X 2)
Culture: NO GROWTH
Culture: NO GROWTH
Special Requests: ADEQUATE

## 2019-07-07 ENCOUNTER — Emergency Department (HOSPITAL_COMMUNITY): Payer: Medicaid Other

## 2019-07-07 ENCOUNTER — Inpatient Hospital Stay (HOSPITAL_COMMUNITY)
Admission: EM | Admit: 2019-07-07 | Discharge: 2019-08-28 | DRG: 003 | Disposition: A | Discharge status: Skilled Nursing Facility | Payer: Medicaid Other | Attending: Family Medicine | Admitting: Family Medicine

## 2019-07-07 ENCOUNTER — Other Ambulatory Visit: Payer: Self-pay

## 2019-07-07 ENCOUNTER — Encounter (HOSPITAL_COMMUNITY): Payer: Self-pay | Admitting: Internal Medicine

## 2019-07-07 DIAGNOSIS — I959 Hypotension, unspecified: Secondary | ICD-10-CM | POA: Diagnosis not present

## 2019-07-07 DIAGNOSIS — R000 Tachycardia, unspecified: Secondary | ICD-10-CM | POA: Diagnosis not present

## 2019-07-07 DIAGNOSIS — D638 Anemia in other chronic diseases classified elsewhere: Secondary | ICD-10-CM | POA: Diagnosis present

## 2019-07-07 DIAGNOSIS — R1310 Dysphagia, unspecified: Secondary | ICD-10-CM

## 2019-07-07 DIAGNOSIS — Z8701 Personal history of pneumonia (recurrent): Secondary | ICD-10-CM

## 2019-07-07 DIAGNOSIS — R627 Adult failure to thrive: Secondary | ICD-10-CM | POA: Diagnosis not present

## 2019-07-07 DIAGNOSIS — C8590 Non-Hodgkin lymphoma, unspecified, unspecified site: Secondary | ICD-10-CM | POA: Diagnosis not present

## 2019-07-07 DIAGNOSIS — R579 Shock, unspecified: Secondary | ICD-10-CM | POA: Diagnosis not present

## 2019-07-07 DIAGNOSIS — R64 Cachexia: Secondary | ICD-10-CM | POA: Diagnosis present

## 2019-07-07 DIAGNOSIS — Z430 Encounter for attention to tracheostomy: Secondary | ICD-10-CM

## 2019-07-07 DIAGNOSIS — D6489 Other specified anemias: Secondary | ICD-10-CM | POA: Diagnosis present

## 2019-07-07 DIAGNOSIS — I4891 Unspecified atrial fibrillation: Secondary | ICD-10-CM | POA: Diagnosis present

## 2019-07-07 DIAGNOSIS — Z681 Body mass index (BMI) 19 or less, adult: Secondary | ICD-10-CM

## 2019-07-07 DIAGNOSIS — J984 Other disorders of lung: Secondary | ICD-10-CM | POA: Diagnosis not present

## 2019-07-07 DIAGNOSIS — J9690 Respiratory failure, unspecified, unspecified whether with hypoxia or hypercapnia: Secondary | ICD-10-CM

## 2019-07-07 DIAGNOSIS — R680 Hypothermia, not associated with low environmental temperature: Secondary | ICD-10-CM | POA: Diagnosis not present

## 2019-07-07 DIAGNOSIS — E43 Unspecified severe protein-calorie malnutrition: Secondary | ICD-10-CM

## 2019-07-07 DIAGNOSIS — C139 Malignant neoplasm of hypopharynx, unspecified: Secondary | ICD-10-CM | POA: Diagnosis present

## 2019-07-07 DIAGNOSIS — I4892 Unspecified atrial flutter: Secondary | ICD-10-CM | POA: Diagnosis present

## 2019-07-07 DIAGNOSIS — R0602 Shortness of breath: Secondary | ICD-10-CM

## 2019-07-07 DIAGNOSIS — E871 Hypo-osmolality and hyponatremia: Secondary | ICD-10-CM | POA: Diagnosis not present

## 2019-07-07 DIAGNOSIS — C131 Malignant neoplasm of aryepiglottic fold, hypopharyngeal aspect: Secondary | ICD-10-CM | POA: Diagnosis not present

## 2019-07-07 DIAGNOSIS — J690 Pneumonitis due to inhalation of food and vomit: Secondary | ICD-10-CM | POA: Diagnosis present

## 2019-07-07 DIAGNOSIS — F1010 Alcohol abuse, uncomplicated: Secondary | ICD-10-CM | POA: Diagnosis present

## 2019-07-07 DIAGNOSIS — R05 Cough: Secondary | ICD-10-CM | POA: Diagnosis not present

## 2019-07-07 DIAGNOSIS — J9601 Acute respiratory failure with hypoxia: Secondary | ICD-10-CM | POA: Diagnosis not present

## 2019-07-07 DIAGNOSIS — A419 Sepsis, unspecified organism: Secondary | ICD-10-CM | POA: Diagnosis not present

## 2019-07-07 DIAGNOSIS — J432 Centrilobular emphysema: Secondary | ICD-10-CM | POA: Diagnosis present

## 2019-07-07 DIAGNOSIS — J449 Chronic obstructive pulmonary disease, unspecified: Secondary | ICD-10-CM | POA: Diagnosis not present

## 2019-07-07 DIAGNOSIS — F17210 Nicotine dependence, cigarettes, uncomplicated: Secondary | ICD-10-CM | POA: Diagnosis present

## 2019-07-07 DIAGNOSIS — R6520 Severe sepsis without septic shock: Secondary | ICD-10-CM | POA: Diagnosis not present

## 2019-07-07 DIAGNOSIS — E876 Hypokalemia: Secondary | ICD-10-CM | POA: Diagnosis present

## 2019-07-07 DIAGNOSIS — Z7189 Other specified counseling: Secondary | ICD-10-CM | POA: Diagnosis not present

## 2019-07-07 DIAGNOSIS — J189 Pneumonia, unspecified organism: Secondary | ICD-10-CM | POA: Diagnosis not present

## 2019-07-07 DIAGNOSIS — R042 Hemoptysis: Secondary | ICD-10-CM | POA: Diagnosis not present

## 2019-07-07 DIAGNOSIS — Z7289 Other problems related to lifestyle: Secondary | ICD-10-CM

## 2019-07-07 DIAGNOSIS — R531 Weakness: Secondary | ICD-10-CM | POA: Diagnosis not present

## 2019-07-07 DIAGNOSIS — Z930 Tracheostomy status: Secondary | ICD-10-CM | POA: Diagnosis not present

## 2019-07-07 DIAGNOSIS — Z8249 Family history of ischemic heart disease and other diseases of the circulatory system: Secondary | ICD-10-CM

## 2019-07-07 DIAGNOSIS — I10 Essential (primary) hypertension: Secondary | ICD-10-CM | POA: Diagnosis not present

## 2019-07-07 DIAGNOSIS — Y842 Radiological procedure and radiotherapy as the cause of abnormal reaction of the patient, or of later complication, without mention of misadventure at the time of the procedure: Secondary | ICD-10-CM | POA: Diagnosis not present

## 2019-07-07 DIAGNOSIS — Z20822 Contact with and (suspected) exposure to covid-19: Secondary | ICD-10-CM | POA: Diagnosis present

## 2019-07-07 DIAGNOSIS — Z7401 Bed confinement status: Secondary | ICD-10-CM | POA: Diagnosis not present

## 2019-07-07 DIAGNOSIS — L89152 Pressure ulcer of sacral region, stage 2: Secondary | ICD-10-CM | POA: Diagnosis not present

## 2019-07-07 DIAGNOSIS — R58 Hemorrhage, not elsewhere classified: Secondary | ICD-10-CM | POA: Diagnosis not present

## 2019-07-07 DIAGNOSIS — D649 Anemia, unspecified: Secondary | ICD-10-CM | POA: Diagnosis not present

## 2019-07-07 DIAGNOSIS — Z8572 Personal history of non-Hodgkin lymphomas: Secondary | ICD-10-CM

## 2019-07-07 DIAGNOSIS — Z515 Encounter for palliative care: Secondary | ICD-10-CM | POA: Diagnosis not present

## 2019-07-07 DIAGNOSIS — J9501 Hemorrhage from tracheostomy stoma: Secondary | ICD-10-CM | POA: Diagnosis not present

## 2019-07-07 DIAGNOSIS — E162 Hypoglycemia, unspecified: Secondary | ICD-10-CM | POA: Diagnosis not present

## 2019-07-07 DIAGNOSIS — R0689 Other abnormalities of breathing: Secondary | ICD-10-CM | POA: Diagnosis not present

## 2019-07-07 DIAGNOSIS — E861 Hypovolemia: Secondary | ICD-10-CM | POA: Diagnosis present

## 2019-07-07 DIAGNOSIS — C4442 Squamous cell carcinoma of skin of scalp and neck: Secondary | ICD-10-CM

## 2019-07-07 DIAGNOSIS — Z789 Other specified health status: Secondary | ICD-10-CM

## 2019-07-07 DIAGNOSIS — D509 Iron deficiency anemia, unspecified: Secondary | ICD-10-CM | POA: Diagnosis present

## 2019-07-07 DIAGNOSIS — M2550 Pain in unspecified joint: Secondary | ICD-10-CM | POA: Diagnosis not present

## 2019-07-07 DIAGNOSIS — R54 Age-related physical debility: Secondary | ICD-10-CM | POA: Diagnosis present

## 2019-07-07 DIAGNOSIS — F1410 Cocaine abuse, uncomplicated: Secondary | ICD-10-CM | POA: Diagnosis present

## 2019-07-07 DIAGNOSIS — E872 Acidosis: Secondary | ICD-10-CM | POA: Diagnosis not present

## 2019-07-07 DIAGNOSIS — J441 Chronic obstructive pulmonary disease with (acute) exacerbation: Secondary | ICD-10-CM | POA: Diagnosis not present

## 2019-07-07 DIAGNOSIS — K5900 Constipation, unspecified: Secondary | ICD-10-CM | POA: Diagnosis not present

## 2019-07-07 DIAGNOSIS — I700 Atherosclerosis of aorta: Secondary | ICD-10-CM | POA: Diagnosis present

## 2019-07-07 DIAGNOSIS — R1013 Epigastric pain: Secondary | ICD-10-CM | POA: Diagnosis not present

## 2019-07-07 DIAGNOSIS — R918 Other nonspecific abnormal finding of lung field: Secondary | ICD-10-CM

## 2019-07-07 DIAGNOSIS — K1233 Oral mucositis (ulcerative) due to radiation: Secondary | ICD-10-CM | POA: Diagnosis not present

## 2019-07-07 DIAGNOSIS — Z9889 Other specified postprocedural states: Secondary | ICD-10-CM

## 2019-07-07 DIAGNOSIS — R5381 Other malaise: Secondary | ICD-10-CM | POA: Diagnosis not present

## 2019-07-07 DIAGNOSIS — J9621 Acute and chronic respiratory failure with hypoxia: Secondary | ICD-10-CM | POA: Diagnosis not present

## 2019-07-07 DIAGNOSIS — I9581 Postprocedural hypotension: Secondary | ICD-10-CM | POA: Diagnosis not present

## 2019-07-07 DIAGNOSIS — R1312 Dysphagia, oropharyngeal phase: Secondary | ICD-10-CM | POA: Diagnosis not present

## 2019-07-07 DIAGNOSIS — L8990 Pressure ulcer of unspecified site, unspecified stage: Secondary | ICD-10-CM | POA: Insufficient documentation

## 2019-07-07 DIAGNOSIS — R221 Localized swelling, mass and lump, neck: Secondary | ICD-10-CM | POA: Diagnosis not present

## 2019-07-07 DIAGNOSIS — J181 Lobar pneumonia, unspecified organism: Secondary | ICD-10-CM | POA: Diagnosis not present

## 2019-07-07 DIAGNOSIS — F329 Major depressive disorder, single episode, unspecified: Secondary | ICD-10-CM | POA: Diagnosis present

## 2019-07-07 DIAGNOSIS — D491 Neoplasm of unspecified behavior of respiratory system: Secondary | ICD-10-CM | POA: Diagnosis not present

## 2019-07-07 DIAGNOSIS — C329 Malignant neoplasm of larynx, unspecified: Secondary | ICD-10-CM | POA: Diagnosis not present

## 2019-07-07 DIAGNOSIS — J9509 Other tracheostomy complication: Secondary | ICD-10-CM | POA: Diagnosis not present

## 2019-07-07 DIAGNOSIS — J90 Pleural effusion, not elsewhere classified: Secondary | ICD-10-CM | POA: Diagnosis not present

## 2019-07-07 DIAGNOSIS — R6889 Other general symptoms and signs: Secondary | ICD-10-CM | POA: Diagnosis not present

## 2019-07-07 HISTORY — DX: Chronic obstructive pulmonary disease, unspecified: J44.9

## 2019-07-07 LAB — CBC WITH DIFFERENTIAL/PLATELET
Abs Immature Granulocytes: 0.03 10*3/uL (ref 0.00–0.07)
Basophils Absolute: 0 10*3/uL (ref 0.0–0.1)
Basophils Relative: 0 %
Eosinophils Absolute: 0 10*3/uL (ref 0.0–0.5)
Eosinophils Relative: 0 %
HCT: 38.8 % — ABNORMAL LOW (ref 39.0–52.0)
Hemoglobin: 12.7 g/dL — ABNORMAL LOW (ref 13.0–17.0)
Immature Granulocytes: 0 %
Lymphocytes Relative: 15 %
Lymphs Abs: 1.3 10*3/uL (ref 0.7–4.0)
MCH: 28.5 pg (ref 26.0–34.0)
MCHC: 32.7 g/dL (ref 30.0–36.0)
MCV: 87.2 fL (ref 80.0–100.0)
Monocytes Absolute: 0.8 10*3/uL (ref 0.1–1.0)
Monocytes Relative: 9 %
Neutro Abs: 6.3 10*3/uL (ref 1.7–7.7)
Neutrophils Relative %: 76 %
Platelets: 276 10*3/uL (ref 150–400)
RBC: 4.45 MIL/uL (ref 4.22–5.81)
RDW: 13.2 % (ref 11.5–15.5)
WBC: 8.4 10*3/uL (ref 4.0–10.5)
nRBC: 0 % (ref 0.0–0.2)

## 2019-07-07 LAB — COMPREHENSIVE METABOLIC PANEL
ALT: 11 U/L (ref 0–44)
AST: 18 U/L (ref 15–41)
Albumin: 3.3 g/dL — ABNORMAL LOW (ref 3.5–5.0)
Alkaline Phosphatase: 74 U/L (ref 38–126)
Anion gap: 13 (ref 5–15)
BUN: 27 mg/dL — ABNORMAL HIGH (ref 8–23)
CO2: 25 mmol/L (ref 22–32)
Calcium: 8.9 mg/dL (ref 8.9–10.3)
Chloride: 100 mmol/L (ref 98–111)
Creatinine, Ser: 0.86 mg/dL (ref 0.61–1.24)
GFR calc Af Amer: >60 mL/min (ref >60)
GFR calc non Af Amer: >60 mL/min (ref >60)
Glucose, Bld: 100 mg/dL — ABNORMAL HIGH (ref 70–99)
Potassium: 4.8 mmol/L (ref 3.5–5.1)
Sodium: 138 mmol/L (ref 135–145)
Total Bilirubin: 0.9 mg/dL (ref 0.3–1.2)
Total Protein: 7.8 g/dL (ref 6.5–8.1)

## 2019-07-07 LAB — RESPIRATORY PANEL BY RT PCR (FLU A&B, COVID)
Influenza A by PCR: NEGATIVE
Influenza B by PCR: NEGATIVE
SARS Coronavirus 2 by RT PCR: NEGATIVE

## 2019-07-07 LAB — URINALYSIS, ROUTINE W REFLEX MICROSCOPIC
Bacteria, UA: NONE SEEN
Bilirubin Urine: NEGATIVE
Glucose, UA: NEGATIVE mg/dL
Hgb urine dipstick: NEGATIVE
Ketones, ur: NEGATIVE mg/dL
Leukocytes,Ua: NEGATIVE
Nitrite: NEGATIVE
Protein, ur: 30 mg/dL — AB
Specific Gravity, Urine: 1.016 (ref 1.005–1.030)
pH: 5 (ref 5.0–8.0)

## 2019-07-07 LAB — LACTIC ACID, PLASMA
Lactic Acid, Venous: 2.5 mmol/L (ref 0.5–1.9)
Lactic Acid, Venous: 1.7 mmol/L (ref 0.5–1.9)

## 2019-07-07 LAB — APTT: aPTT: 27 seconds (ref 24–36)

## 2019-07-07 LAB — PROTIME-INR
INR: 1 (ref 0.8–1.2)
Prothrombin Time: 13 seconds (ref 11.4–15.2)

## 2019-07-07 MED ORDER — SODIUM CHLORIDE 0.9 % IV SOLN
1.0000 g | Freq: Once | INTRAVENOUS | Status: AC
  Administered 2019-07-07: 1 g via INTRAVENOUS
  Filled 2019-07-07: qty 10

## 2019-07-07 MED ORDER — SODIUM CHLORIDE 0.9 % IV SOLN
1.5000 g | Freq: Four times a day (QID) | INTRAVENOUS | Status: AC
  Administered 2019-07-07 – 2019-07-14 (×28): 1.5 g via INTRAVENOUS
  Filled 2019-07-07 (×2): qty 1.5
  Filled 2019-07-07: qty 4
  Filled 2019-07-07 (×4): qty 1.5
  Filled 2019-07-07 (×3): qty 4
  Filled 2019-07-07: qty 1.5
  Filled 2019-07-07 (×2): qty 4
  Filled 2019-07-07 (×2): qty 1.5
  Filled 2019-07-07 (×2): qty 4
  Filled 2019-07-07: qty 1.5
  Filled 2019-07-07 (×2): qty 4
  Filled 2019-07-07: qty 1.5
  Filled 2019-07-07: qty 4
  Filled 2019-07-07: qty 1.5
  Filled 2019-07-07 (×2): qty 4
  Filled 2019-07-07 (×2): qty 1.5
  Filled 2019-07-07: qty 4
  Filled 2019-07-07 (×2): qty 1.5

## 2019-07-07 MED ORDER — IOHEXOL 350 MG/ML SOLN
100.0000 mL | Freq: Once | INTRAVENOUS | Status: AC | PRN
  Administered 2019-07-07: 75 mL via INTRAVENOUS

## 2019-07-07 MED ORDER — IPRATROPIUM-ALBUTEROL 0.5-2.5 (3) MG/3ML IN SOLN
3.0000 mL | Freq: Four times a day (QID) | RESPIRATORY_TRACT | Status: DC | PRN
  Administered 2019-07-27: 3 mL via RESPIRATORY_TRACT
  Filled 2019-07-07 (×2): qty 3

## 2019-07-07 MED ORDER — ADULT MULTIVITAMIN W/MINERALS CH
1.0000 | ORAL_TABLET | Freq: Every day | ORAL | Status: DC
  Filled 2019-07-07: qty 1

## 2019-07-07 MED ORDER — NICOTINE 14 MG/24HR TD PT24
14.0000 mg | MEDICATED_PATCH | Freq: Every day | TRANSDERMAL | Status: DC
  Administered 2019-07-07 – 2019-07-08 (×2): 14 mg via TRANSDERMAL
  Filled 2019-07-07 (×2): qty 1

## 2019-07-07 MED ORDER — SODIUM CHLORIDE 0.9 % IV BOLUS (SEPSIS)
1000.0000 mL | Freq: Once | INTRAVENOUS | Status: AC
  Administered 2019-07-07: 1000 mL via INTRAVENOUS

## 2019-07-07 MED ORDER — LORAZEPAM 1 MG PO TABS: 1.0000 mg | ORAL_TABLET | ORAL | Status: DC | PRN

## 2019-07-07 MED ORDER — THIAMINE HCL 100 MG PO TABS
100.0000 mg | ORAL_TABLET | Freq: Every day | ORAL | Status: DC
  Filled 2019-07-07: qty 1

## 2019-07-07 MED ORDER — ENOXAPARIN SODIUM 40 MG/0.4ML ~~LOC~~ SOLN
40.0000 mg | SUBCUTANEOUS | Status: DC
  Administered 2019-07-09: 40 mg via SUBCUTANEOUS
  Filled 2019-07-07 (×2): qty 0.4

## 2019-07-07 MED ORDER — SODIUM CHLORIDE 0.9 % IV BOLUS
1000.0000 mL | Freq: Once | INTRAVENOUS | Status: AC
  Administered 2019-07-07: 1000 mL via INTRAVENOUS

## 2019-07-07 MED ORDER — LORAZEPAM 2 MG/ML IJ SOLN: 1.0000 mg | INTRAMUSCULAR | Status: DC | PRN

## 2019-07-07 MED ORDER — METRONIDAZOLE IN NACL 5-0.79 MG/ML-% IV SOLN: 500.0000 mg | Freq: Once | INTRAVENOUS | Status: DC

## 2019-07-07 MED ORDER — FOLIC ACID 1 MG PO TABS
1.0000 mg | ORAL_TABLET | Freq: Every day | ORAL | Status: DC
  Filled 2019-07-07: qty 1

## 2019-07-07 MED ORDER — THIAMINE HCL 100 MG/ML IJ SOLN
100.0000 mg | Freq: Every day | INTRAMUSCULAR | Status: DC
  Administered 2019-07-07 – 2019-07-20 (×12): 100 mg via INTRAVENOUS
  Filled 2019-07-07 (×12): qty 2

## 2019-07-07 MED ORDER — SODIUM CHLORIDE 0.9 % IV SOLN
500.0000 mg | Freq: Once | INTRAVENOUS | Status: AC
  Administered 2019-07-07: 500 mg via INTRAVENOUS
  Filled 2019-07-07: qty 500

## 2019-07-07 MED ORDER — SODIUM CHLORIDE 0.9 % IV SOLN: INTRAVENOUS | Status: AC

## 2019-07-07 NOTE — ED Provider Notes (Addendum)
Emergency Department Provider Note   I have reviewed the triage vital signs and the nursing notes.   HISTORY  Chief Complaint Failure To Thrive (Possible Sepsis )   HPI Devon Peterson is a 64 y.o. male with PMH of tobacco use but no other known history presents to the emergency department for evaluation of cough with fatigue and shortness of breath.  He lives at home with his son and reports some associated unintentional weight loss despite a good appetite.  He denies noticing fever, body aches, chills.  He is not experiencing chest pain.  He denies headache, vomiting, diarrhea.  No abdominal or back pain.  No pain with urination.  States that his cough is worsened along with his fatigue and so he called 911 for assistance.  He was admitted and February of this year for pneumonia and what seems to be a similar presentation. No radiation of symptoms or modifying factors.    Past Medical History:  Diagnosis Date  . Medical history non-contributory     Patient Active Problem List   Diagnosis Date Noted  . Aspiration pneumonia ([place]) 07/07/2019  . Dysphagia 07/07/2019  . Severe protein-calorie malnutrition ([place]) 07/07/2019  . Alcohol use 07/07/2019  . Sepsis ([place]) 04/17/2019  . COPD exacerbation ([place])   . Lactic acidosis   . Tobacco abuse   . Alcohol abuse     Past Surgical History:  Procedure Laterality Date  . NO PAST SURGERIES      Allergies Patient has no known allergies.  Family History  Problem Relation Age of Onset  . CAD Father     Social History Social History   Tobacco Use  . Smoking status: Current Every Day Smoker    Packs/day: 0.50    Types: Cigarettes  . Smokeless tobacco: Never Used  Substance Use Topics  . Alcohol use: Yes    Alcohol/week: 24.0 standard drinks    Types: 24 Cans of beer per week  . Drug use: Not Currently    Review of Systems  Constitutional: No fever/chills. Positive fatigue.  Eyes: No visual changes. ENT: No sore  throat. Cardiovascular: Denies chest pain. Respiratory: Positive shortness of breath and cough.  Gastrointestinal: No abdominal pain.  No nausea, no vomiting.  No diarrhea.  No constipation. Genitourinary: Negative for dysuria. Musculoskeletal: Negative for back pain. Skin: Negative for rash. Neurological: Negative for headaches, focal weakness or numbness.  10-point ROS otherwise negative.  ____________________________________________   PHYSICAL EXAM:  VITAL SIGNS: Vitals:   07/07/19 2000 07/07/19 2008  BP: (!) 122/101 (!) 136/100  Pulse: (!) 41 (!) 122  Resp: 19 20  Temp:    SpO2: (!) 77% 95%     Constitutional: Alert and oriented.  Patient is cachectic and smells of urine.  Eyes: Conjunctivae are normal.  Head: Atraumatic. Nose: No congestion/rhinnorhea. Mouth/Throat: Mucous membranes are dry.  Neck: No stridor.  Cardiovascular: Sinus tachycardia. Good peripheral circulation. Grossly normal heart sounds.   Respiratory: Normal respiratory effort.  No retractions. Lungs with rhonchi bilaterally with no wheezing. No rales.  Gastrointestinal: Soft and nontender. No distention.  Musculoskeletal: Very thin extremities. No bony tenderness or deformity.  Neurologic:  Normal speech and language. No gross focal neurologic deficits are appreciated.  Skin:  Skin is warm, dry and intact. No rash noted.   ____________________________________________   LABS (all labs ordered are listed, but only abnormal results are displayed)  Labs Reviewed  LACTIC ACID, PLASMA - Abnormal; Notable for the following components:  Result Value   Lactic Acid, Venous 2.5 (*)    All other components within normal limits  COMPREHENSIVE METABOLIC PANEL - Abnormal; Notable for the following components:   Glucose, Bld 100 (*)    BUN 27 (*)    Albumin 3.3 (*)    All other components within normal limits  CBC WITH DIFFERENTIAL/PLATELET - Abnormal; Notable for the following components:    Hemoglobin 12.7 (*)    HCT 38.8 (*)    All other components within normal limits  URINALYSIS, ROUTINE W REFLEX MICROSCOPIC - Abnormal; Notable for the following components:   APPearance HAZY (*)    Protein, ur 30 (*)    All other components within normal limits  RESPIRATORY PANEL BY RT PCR (FLU A&B, COVID)  CULTURE, BLOOD (ROUTINE X 2)  CULTURE, BLOOD (ROUTINE X 2)  URINE CULTURE  LACTIC ACID, PLASMA  APTT  PROTIME-INR  HIV ANTIBODY (ROUTINE TESTING W REFLEX)  MAGNESIUM  PHOSPHORUS  RAPID URINE DRUG SCREEN, HOSP PERFORMED   ____________________________________________  EKG   EKG Interpretation  Date/Time:  Tuesday Jul 07 2019 17:17:43 EDT Ventricular Rate:  137 PR Interval:    QRS Duration: 78 QT Interval:  301 QTC Calculation: 455 R Axis:   82 Text Interpretation: Sinus tachycardia Borderline right axis deviation Nonspecific T abnrm, anterolateral leads No STEMI Confirmed by Nanda Quinton [phone]) on 07/07/2019 5:22:20 PM       ____________________________________________  RADIOLOGY  CT Angio Chest PE W and/or Wo Contrast  Result Date: 07/07/2019 CLINICAL DATA:  Failure to thrive, history of pneumonia, cough EXAM: CT ANGIOGRAPHY CHEST WITH CONTRAST TECHNIQUE: Multidetector CT imaging of the chest was performed using the standard protocol during bolus administration of intravenous contrast. Multiplanar CT image reconstructions and MIPs were obtained to evaluate the vascular anatomy. CONTRAST:  42mL OMNIPAQUE IOHEXOL 350 MG/ML SOLN COMPARISON:  07/07/2019 FINDINGS: Cardiovascular: This is a technically adequate evaluation of the pulmonary vasculature. No filling defects or pulmonary emboli. The heart is unremarkable without pericardial effusion. Mild calcification of the mitral and aortic valves. Mild atherosclerosis of the coronary vasculature. Mediastinum/Nodes: No enlarged mediastinal, hilar, or axillary lymph nodes. Thyroid gland, trachea, and esophagus demonstrate no  significant findings. Because of marked thoracic kyphosis and positioning, a significant portion of the hypopharynx is included on this exam. There is asymmetric soft tissue in the supraglottic region, incompletely evaluated on this study. I am suspicious of a mass in the right hypopharynx, reference image 15. Follow-up CT neck with contrast or direct visual inspection is recommended. Lungs/Pleura: There is severe background emphysema. Bilateral lower lobe consolidation is seen, left greater than right. Given the above findings in the hypopharynx, aspiration should be considered in addition to community acquired pneumonia. Within the densely consolidated left lower lobe there is evidence of an enhancing 2.7 cm mass, which could reflect metastatic disease. This is best seen on image 126 of series 4. No effusion or pneumothorax. Upper Abdomen: Patient is markedly cachectic. No gross abnormalities are visualized. Musculoskeletal: No acute or destructive bony lesions. Reconstructed images demonstrate no additional findings. Review of the MIP images confirms the above findings. IMPRESSION: 1. No evidence of pulmonary embolus. 2. Abnormal soft tissue in the hypopharynx, suspicious for underlying mass. CT neck or direct visual inspection recommended for further evaluation. 3. Dense bilateral lower lobe consolidation, left greater than right. Aspiration is suspected given findings in the hypopharynx. 4. There is evidence of an enhancing mass within the densely consolidated left lower lobe, metastatic disease or primary neoplasm  not excluded. 5. Marked cachexia. Electronically Signed   By: Randa Ngo M.D.   On: 07/07/2019 19:12   DG Chest Port 1 View  Result Date: 07/07/2019 CLINICAL DATA:  Cough. EXAM: PORTABLE CHEST 1 VIEW COMPARISON:  04/18/2019 FINDINGS: There is a new left lower lobe airspace opacity concerning for pneumonia. The heart size is stable. Aortic calcifications are noted. The lungs are hyperexpanded.  There is no pneumothorax. There is no acute osseous abnormality. IMPRESSION: 1. New left lower lobe airspace opacity concerning for pneumonia. Follow-up to radiologic resolution is recommended. 2. COPD. Electronically Signed   By: Constance Holster M.D.   On: 07/07/2019 17:18    ____________________________________________   PROCEDURES  Procedure(s) performed:   .Critical Care Performed by: Margette Fast, MD Authorized by: Margette Fast, MD   Critical care provider statement:    Critical care time (minutes):  45   Critical care time was exclusive of:  Separately billable procedures and treating other patients and teaching time   Critical care was necessary to treat or prevent imminent or life-threatening deterioration of the following conditions:  Sepsis, respiratory failure and dehydration   Critical care was time spent personally by me on the following activities:  Discussions with consultants, evaluation of patient's response to treatment, examination of patient, ordering and performing treatments and interventions, ordering and review of laboratory studies, ordering and review of radiographic studies, pulse oximetry, re-evaluation of patient's condition, obtaining history from patient or surrogate, review of old charts, blood draw for specimens and development of treatment plan with patient or surrogate   I assumed direction of critical care for this patient from another provider in my specialty: no       ____________________________________________   INITIAL IMPRESSION / ASSESSMENT AND PLAN / ED COURSE  Pertinent labs & imaging results that were available during my care of the patient were reviewed by me and considered in my medical decision making (see chart for details).   Patient arrives to the emergency department with productive cough, shortness of breath, fatigue.  He is cachectic and smells of urine here.  No altered mental status.  He is bringing up sputum which is  white.  No gross hemoptysis.  His heart rate on arrival is in the 150s and appears to be sinus rhythm.  Plan for rectal temp along with IV fluids.  Hold antibiotics for now pending rectal temp and initial lactic acid.  Similar admission in February of this year which was initially thought to be sepsis but later seemed more consistent with COPD.   07:23 PM  Patient sent for CTA despite x-ray showing likely infiltrate in the left upper lobe.  Patient has persistent tachycardia and now elevated lactate.  This is similar to his prior admission from February.  Patient is very cachectic and I suspect at least the possibility of an underlying mass or possibly even PE.   CT angio shows likely aspiration with mass in the hypopharynx as well as a lesion in the left lobe of the lung.  Question primary neoplasm versus metastatic disease.  This correlates with the patient's presentation well. Broaden with Flagyl and will send for CT neck but no emergent airway concerns at this time. Will consult [facility]. COVID negative.   .Discussed patient's case with [facility], Dr. Marlowe Sax to request admission. Patient and family (if present) updated with plan. Care transferred to [facility] service.  I reviewed all nursing notes, vitals, pertinent old records, EKGs, labs, imaging (as available).  08:10 PM  Called by radiology after additional scans ordered with contrast.  They spoke with the radiologist and their preference would be that unless these are needed emergently to have them done tomorrow given that the patient was just administered IV contrast.  I do not have an emergent indication for the CT neck and feel this appropriate.  The hospitalist has ordered a CT abdomen pelvis with contrast as well.  They have reached out to the hospitalist to discuss deferring the scans until tomorrow.  ____________________________________________  FINAL CLINICAL IMPRESSION(S) / ED DIAGNOSES  Final diagnoses:  Aspiration pneumonia of both lungs,  unspecified aspiration pneumonia type, unspecified part of lung ([place])  Lung mass  Neck mass     MEDICATIONS GIVEN DURING THIS VISIT:  Medications  enoxaparin (LOVENOX) injection 40 mg (has no administration in time range)  LORazepam (ATIVAN) tablet 1-4 mg (has no administration in time range)    Or  LORazepam (ATIVAN) injection 1-4 mg (has no administration in time range)  thiamine tablet 100 mg (has no administration in time range)    Or  thiamine (B-1) injection 100 mg (has no administration in time range)  folic acid (FOLVITE) tablet 1 mg (has no administration in time range)  multivitamin with minerals tablet 1 tablet (has no administration in time range)  nicotine (NICODERM CQ - dosed in mg/24 hours) patch 14 mg (has no administration in time range)  ipratropium-albuterol (DUONEB) 0.5-2.5 (3) MG/3ML nebulizer solution 3 mL (has no administration in time range)  0.9 %  sodium chloride infusion (has no administration in time range)  sodium chloride 0.9 % bolus 1,000 mL (0 mLs Intravenous Stopped 07/07/19 1838)  cefTRIAXone (ROCEPHIN) 1 g in sodium chloride 0.9 % 100 mL IVPB (0 g Intravenous Stopped 07/07/19 1830)  azithromycin (ZITHROMAX) 500 mg in sodium chloride 0.9 % 250 mL IVPB (0 mg Intravenous Stopped 07/07/19 1855)  sodium chloride 0.9 % bolus 1,000 mL (1,000 mLs Intravenous New Bag/Given 07/07/19 1923)  iohexol (OMNIPAQUE) 350 MG/ML injection 100 mL (75 mLs Intravenous Contrast Given 07/07/19 1900)    Note:  This document was prepared using Dragon voice recognition software and may include unintentional dictation errors.  Nanda Quinton, MD, [facility]    Tashina Credit, [facility], MD 07/07/19 2017    Margette Fast, MD 07/07/19 2017

## 2019-07-07 NOTE — ED Triage Notes (Signed)
Pt arrived via [facility] from home CC failure to thrive X1 week. Per EMS A/OX4 ambulate to transfer with assistance.   22G left FA 1000 NS   Hx pneumonia in Feb, EMS has no further history to report at this time

## 2019-07-07 NOTE — Progress Notes (Signed)
Pt triggered MEWS Red for HR and Rectal temp of 95 @ 2115. Pt stated he was cold upon arriving to the room turned temp up in room at that time, gave PT a wash up and full skin assessment prior to this set of VS being taken.  game pt warm blankets and Initiated MEW Red protocol. Notified M. Sharlet Salina @ 2300 after requesting an IVF rate change.  Continuing to monitor.  Pt resting comfortably, on room air in no distress.

## 2019-07-07 NOTE — H&P (Addendum)
History and Physical    Devon Peterson [id number] DOB: 13-Apr-1955 DOA: 07/07/2019  PCP: Patient, No Pcp Per Patient coming from: Home  Chief Complaint: Cough, shortness of breath  HPI: Devon Peterson is a 64 y.o. male with medical history significant of COPD, polysubstance abuse (alcohol, tobacco, cocaine), hospital admission 04/17/2019-04/19/2019 for sepsis secondary to pneumonia and COPD exacerbation presenting to the ED via EMS for evaluation of cough, shortness of breath, fatigue, and unintentional weight loss.  History provided by patient and his nephew at bedside.  Nephew states patient was admitted to the hospital a month ago for pneumonia and since then has continued to do poorly.  He has difficulty swallowing food and has been spitting up saliva.  He is coughing a lot.  Patient denies fevers, chills, shortness of breath, chest pain, nausea, vomiting, abdominal pain, diarrhea, or dysuria.  Nephew states patient has been smoking a pack of cigarettes daily since a very young age.  He has lost a lot of weight recently. Addendum: 07/07/2019 8:40 PM.  Patient also denied history of hematemesis, hematochezia, or melena.  ED Course: Tachycardic on arrival with heart rate in the 150s, appeared to be sinus rhythm.  Afebrile.  Slightly tachypneic.  Labs showing no leukocytosis.  Initial lactic acid 2.5, repeat after IV fluid pending.  Hemoglobin 12.7, stable compared to labs done in February 2021.  Platelet count normal.  BUN 27, creatinine 0.8.  Albumin 3.3.  LFTs normal.  Blood culture x2 pending.  SARS-CoV-2 PCR test negative.  Influenza panel negative.  UA not suggestive of infection.  Urine culture pending. Chest x-ray showing new left lower lobe airspace opacity concerning for pneumonia. CT angiogram chest negative for PE.  Showing abnormal soft tissue in the hypopharynx suspicious for underlying mass.  Dense bilateral lower lobe consolidation, left greater than right.  Aspiration suspected given findings  in the hypopharynx.  Also showing evidence of an enhancing mass within the densely consolidated left lower lobe, metastatic disease or primary neoplasm not excluded.  Marked cachexia.  Patient received ceftriaxone, azithromycin, and 2 L normal saline boluses.  Review of Systems:  All systems reviewed and apart from history of presenting illness, are negative.  Past Medical History:  Diagnosis Date  . Medical history non-contributory     Past Surgical History:  Procedure Laterality Date  . NO PAST SURGERIES       reports that he has been smoking cigarettes. He has been smoking about 0.50 packs per day. He has never used smokeless tobacco. He reports current alcohol use of about 24.0 standard drinks of alcohol per week. He reports previous drug use.  No Known Allergies  Family History  Problem Relation Age of Onset  . CAD Father     Prior to Admission medications   Medication Sig Start Date End Date Taking? Authorizing Provider  azithromycin (ZITHROMAX) 250 MG tablet Take 1 tablet (250 mg total) by mouth daily. Patient not taking: Reported on 07/07/2019 04/19/19   Edwin Dada, MD  cefdinir (OMNICEF) 300 MG capsule Take 1 capsule (300 mg total) by mouth 2 (two) times daily. Patient not taking: Reported on 07/07/2019 04/19/19   Edwin Dada, MD    Physical Exam: Vitals:   07/07/19 1815 07/07/19 1830 07/07/19 1845 07/07/19 1917  BP: (!) 127/102 (!) 122/102 (!) 130/98 (!) 126/95  Pulse:    (!) 48  Resp: 20 18 (!) 22 19  Temp:      TempSrc:      SpO2:    Marland Kitchen)  86%    Physical Exam  Constitutional: He is oriented to person, place, and time.  Cachectic, very lethargic  HENT:  Dry mucous membranes  Eyes: Right eye exhibits no discharge. Left eye exhibits no discharge.  Cardiovascular: Normal rate, regular rhythm and intact distal pulses.  Pulmonary/Chest: He has no wheezes.  Slightly tachypneic with respiratory rate in the low 20s Oxygen saturation in the low  to mid 90s on room air  Abdominal: Soft. Bowel sounds are normal. He exhibits no distension. There is no abdominal tenderness. There is no guarding.  Musculoskeletal:        General: No edema.     Cervical back: Neck supple.  Neurological: He is alert and oriented to person, place, and time.  Skin: Skin is warm and dry.    Labs on Admission: I have personally reviewed following labs and imaging studies  CBC: Recent Labs  Lab 07/07/19 1650  WBC 8.4  NEUTROABS 6.3  HGB 12.7*  HCT 38.8*  MCV 87.2  PLT 811   Basic Metabolic Panel: Recent Labs  Lab 07/07/19 1650  NA 138  K 4.8  CL 100  CO2 25  GLUCOSE 100*  BUN 27*  CREATININE 0.86  CALCIUM 8.9   GFR: CrCl cannot be calculated (Unknown ideal weight.). Liver Function Tests: Recent Labs  Lab 07/07/19 1650  AST 18  ALT 11  ALKPHOS 74  BILITOT 0.9  PROT 7.8  ALBUMIN 3.3*   No results for input(s): LIPASE, AMYLASE in the last 168 hours. No results for input(s): AMMONIA in the last 168 hours. Coagulation Profile: Recent Labs  Lab 07/07/19 1650  INR 1.0   Cardiac Enzymes: No results for input(s): CKTOTAL, CKMB, CKMBINDEX, TROPONINI in the last 168 hours. BNP (last 3 results) No results for input(s): PROBNP in the last 8760 hours. HbA1C: No results for input(s): HGBA1C in the last 72 hours. CBG: No results for input(s): GLUCAP in the last 168 hours. Lipid Profile: No results for input(s): CHOL, HDL, LDLCALC, TRIG, CHOLHDL, LDLDIRECT in the last 72 hours. Thyroid Function Tests: No results for input(s): TSH, T4TOTAL, FREET4, T3FREE, THYROIDAB in the last 72 hours. Anemia Panel: No results for input(s): VITAMINB12, FOLATE, FERRITIN, TIBC, IRON, RETICCTPCT in the last 72 hours. Urine analysis:    Component Value Date/Time   COLORURINE YELLOW 07/07/2019 1729   APPEARANCEUR HAZY (A) 07/07/2019 1729   LABSPEC 1.016 07/07/2019 1729   PHURINE 5.0 07/07/2019 1729   GLUCOSEU NEGATIVE 07/07/2019 1729   HGBUR  NEGATIVE 07/07/2019 1729   BILIRUBINUR NEGATIVE 07/07/2019 1729   KETONESUR NEGATIVE 07/07/2019 1729   PROTEINUR 30 (A) 07/07/2019 1729   UROBILINOGEN 1.0 10/28/2008 0458   NITRITE NEGATIVE 07/07/2019 1729   LEUKOCYTESUR NEGATIVE 07/07/2019 1729    Radiological Exams on Admission: CT Angio Chest PE W and/or Wo Contrast  Result Date: 07/07/2019 CLINICAL DATA:  Failure to thrive, history of pneumonia, cough EXAM: CT ANGIOGRAPHY CHEST WITH CONTRAST TECHNIQUE: Multidetector CT imaging of the chest was performed using the standard protocol during bolus administration of intravenous contrast. Multiplanar CT image reconstructions and MIPs were obtained to evaluate the vascular anatomy. CONTRAST:  51mL OMNIPAQUE IOHEXOL 350 MG/ML SOLN COMPARISON:  07/07/2019 FINDINGS: Cardiovascular: This is a technically adequate evaluation of the pulmonary vasculature. No filling defects or pulmonary emboli. The heart is unremarkable without pericardial effusion. Mild calcification of the mitral and aortic valves. Mild atherosclerosis of the coronary vasculature. Mediastinum/Nodes: No enlarged mediastinal, hilar, or axillary lymph nodes. Thyroid gland, trachea, and esophagus  demonstrate no significant findings. Because of marked thoracic kyphosis and positioning, a significant portion of the hypopharynx is included on this exam. There is asymmetric soft tissue in the supraglottic region, incompletely evaluated on this study. I am suspicious of a mass in the right hypopharynx, reference image 15. Follow-up CT neck with contrast or direct visual inspection is recommended. Lungs/Pleura: There is severe background emphysema. Bilateral lower lobe consolidation is seen, left greater than right. Given the above findings in the hypopharynx, aspiration should be considered in addition to community acquired pneumonia. Within the densely consolidated left lower lobe there is evidence of an enhancing 2.7 cm mass, which could reflect  metastatic disease. This is best seen on image 126 of series 4. No effusion or pneumothorax. Upper Abdomen: Patient is markedly cachectic. No gross abnormalities are visualized. Musculoskeletal: No acute or destructive bony lesions. Reconstructed images demonstrate no additional findings. Review of the MIP images confirms the above findings. IMPRESSION: 1. No evidence of pulmonary embolus. 2. Abnormal soft tissue in the hypopharynx, suspicious for underlying mass. CT neck or direct visual inspection recommended for further evaluation. 3. Dense bilateral lower lobe consolidation, left greater than right. Aspiration is suspected given findings in the hypopharynx. 4. There is evidence of an enhancing mass within the densely consolidated left lower lobe, metastatic disease or primary neoplasm not excluded. 5. Marked cachexia. Electronically Signed   By: Randa Ngo M.D.   On: 07/07/2019 19:12   DG Chest Port 1 View  Result Date: 07/07/2019 CLINICAL DATA:  Cough. EXAM: PORTABLE CHEST 1 VIEW COMPARISON:  04/18/2019 FINDINGS: There is a new left lower lobe airspace opacity concerning for pneumonia. The heart size is stable. Aortic calcifications are noted. The lungs are hyperexpanded. There is no pneumothorax. There is no acute osseous abnormality. IMPRESSION: 1. New left lower lobe airspace opacity concerning for pneumonia. Follow-up to radiologic resolution is recommended. 2. COPD. Electronically Signed   By: Constance Holster M.D.   On: 07/07/2019 17:18    EKG: Independently reviewed.  Sinus tachycardia, heart rate 137.  Baseline wander.  Nonspecific T wave abnormalities.  Assessment/Plan Principal Problem:   Aspiration pneumonia (HCC) Active Problems:   Sepsis ([place])   Dysphagia   Severe protein-calorie malnutrition ([place])   Alcohol use   Sepsis secondary to aspiration pneumonia: Tachycardic and tachypneic on arrival.  No leukocytosis on labs.  Does have mild lactic acidosis.  CT showing dense  bilateral lower lobe consolidation, left greater than right suspicious for aspiration pneumonia given given findings suspicious for an abnormal soft tissue mass in the hypopharynx.  SARS-CoV-2 PCR test negative.  Influenza panel negative.  Currently satting in the low to mid 90s on room air. -Heart rate has improved after IV fluid boluses.  Continue IV fluid hydration and antibiotic coverage with Unasyn.  Trend lactate.  Blood culture x2 pending.  Continuous pulse ox, supplemental oxygen if needed.  Concern for underlying malignancy: CT angiogram showing abnormal soft tissue in the hypopharynx suspicious for an underlying mass. Also showing evidence of an enhancing mass within the densely consolidated left lower lobe, metastatic disease or primary neoplasm not excluded.  Patient is severely cachectic.  Endorsing dysphagia and unintentional weight loss.  Longstanding history of smoking cigarettes. -Order CT soft tissue neck with contrast for further evaluation.  Also order CT abdomen pelvis for further staging.  Consult oncology in a.m.  Dysphagia: Keep n.p.o. at this time, SLP eval in a.m.  Severe protein calorie malnutrition: Patient has poor oral intake, unintentional weight  loss, and severe cachexia.  Nutrition consult ordered.  Generalized weakness/physical deconditioning: PT and OT evaluation.  Alcohol use disorder: Tachycardic on arrival, heart rate improved after fluid boluses.  No other signs of withdrawal at this time.  Order CIWA protocol; Ativan as needed.  Thiamine, folate, and multivitamin.  Tobacco use: Patient has been counseled on smoking cessation.  NicoDerm patch ordered.  History of cocaine use: Check UDS  COPD: Stable.  No wheezing at this time.  DuoNebs as needed.  HIV screening: The patient falls between the ages of 13-64 and should be screened for HIV, therefore HIV testing ordered.  DVT prophylaxis: Lovenox Code Status: Patient wishes to be full code. Family  Communication: Nephew Destin was at bedside and has been updated.  He had the patient's Sister Helene Kelp on speaker phone who has also been updated. Disposition Plan: Status is: Inpatient  Remains inpatient appropriate because:Ongoing diagnostic testing needed not appropriate for outpatient work up and IV treatments appropriate due to intensity of illness or inability to take PO   Dispo: The patient is from: Home              Anticipated d/c is to: SNF              Anticipated d/c date is: > 3 days              Patient currently is not medically stable to d/c.  The medical decision making on this patient was of high complexity and the patient is at high risk for clinical deterioration, therefore this is a level 3 visit.  Shela Leff MD [facility]  If 7PM-7AM, please contact night-coverage [email or web address]  07/07/2019, 7:55 PM

## 2019-07-07 NOTE — ED Notes (Signed)
Pt pant soiled with feces. Pt states its "been a long time" since he has changed his pants or had a bath.   Writer of this note performed a bed bath.  No wounds noted during bed bath.

## 2019-07-07 NOTE — ED Notes (Signed)
Dr Long at bedside

## 2019-07-07 NOTE — Progress Notes (Signed)
Pharmacy Antibiotic Note  Devon Peterson is a 64 y.o. male admitted on 07/07/2019 with aspiration pneumonia. Pharmacy has been consulted for Unasyn dosing.  Plan: Unasyn 1.5g IV q6h Monitor renal function, cultures, clinical course.   Height: 5\' 11"  (180.3 cm) Weight: 42 kg (92 lb 9.5 oz) IBW/kg (Calculated) : 75.3  Temp (24hrs), Avg:98.2 F (36.8 C), Min:98.2 F (36.8 C), Max:98.2 F (36.8 C)  Recent Labs  Lab 07/07/19 1650 07/07/19 1850  WBC 8.4  --   CREATININE 0.86  --   LATICACIDVEN 2.5* 1.7    Estimated Creatinine Clearance: 51.6 mL/min (by C-G formula based on SCr of 0.86 mg/dL).    No Known Allergies  Antimicrobials this admission: 5/4 Ceftriaxone x 1 5/4 Azithromycin x 1 5/4 Unasyn >>  Dose adjustments this admission: --  Microbiology results: 5/4 BCx: sent 5/4 UCx: sent  5/4 COVID: negative  5/4 Influenza A/B: negative   Thank you for allowing pharmacy to be a part of this patient's care.  Luiz Ochoa 07/07/2019 8:52 PM

## 2019-07-07 NOTE — Progress Notes (Signed)
Called by RN to assess patient for BD needs. Auscultation reveals bilateral rhonchi with crackles throughout all fields. SpO2 100% on room air and patient has no SOB complaints. Remains tachycardic in the 130's. IVF's running greater than 100. No need for BD at this time, however might wish to consider the need to decrease fluids. RN made aware.

## 2019-07-07 NOTE — ED Notes (Signed)
Dr. Laverta Baltimore aware of HR

## 2019-07-07 NOTE — ED Notes (Signed)
Pt reports " drinking a 6 pack of beer today and usually drinks most days"

## 2019-07-07 NOTE — Plan of Care (Signed)
Initiated care plan 

## 2019-07-08 ENCOUNTER — Inpatient Hospital Stay (HOSPITAL_COMMUNITY): Payer: Medicaid Other

## 2019-07-08 DIAGNOSIS — R6520 Severe sepsis without septic shock: Secondary | ICD-10-CM

## 2019-07-08 DIAGNOSIS — Z7289 Other problems related to lifestyle: Secondary | ICD-10-CM

## 2019-07-08 DIAGNOSIS — A419 Sepsis, unspecified organism: Principal | ICD-10-CM

## 2019-07-08 DIAGNOSIS — L8990 Pressure ulcer of unspecified site, unspecified stage: Secondary | ICD-10-CM | POA: Insufficient documentation

## 2019-07-08 LAB — COMPREHENSIVE METABOLIC PANEL
ALT: 10 U/L (ref 0–44)
AST: 17 U/L (ref 15–41)
Albumin: 2.9 g/dL — ABNORMAL LOW (ref 3.5–5.0)
Alkaline Phosphatase: 65 U/L (ref 38–126)
Anion gap: 13 (ref 5–15)
BUN: 19 mg/dL (ref 8–23)
CO2: 24 mmol/L (ref 22–32)
Calcium: 8.8 mg/dL — ABNORMAL LOW (ref 8.9–10.3)
Chloride: 107 mmol/L (ref 98–111)
Creatinine, Ser: 0.6 mg/dL — ABNORMAL LOW (ref 0.61–1.24)
GFR calc Af Amer: >60 mL/min (ref >60)
GFR calc non Af Amer: >60 mL/min (ref >60)
Glucose, Bld: 64 mg/dL — ABNORMAL LOW (ref 70–99)
Potassium: 3.4 mmol/L — ABNORMAL LOW (ref 3.5–5.1)
Sodium: 144 mmol/L (ref 135–145)
Total Bilirubin: 1.1 mg/dL (ref 0.3–1.2)
Total Protein: 6.7 g/dL (ref 6.5–8.1)

## 2019-07-08 LAB — CBC WITH DIFFERENTIAL/PLATELET
Abs Immature Granulocytes: 0.02 10*3/uL (ref 0.00–0.07)
Basophils Absolute: 0 10*3/uL (ref 0.0–0.1)
Basophils Relative: 0 %
Eosinophils Absolute: 0 10*3/uL (ref 0.0–0.5)
Eosinophils Relative: 0 %
HCT: 32.8 % — ABNORMAL LOW (ref 39.0–52.0)
Hemoglobin: 11 g/dL — ABNORMAL LOW (ref 13.0–17.0)
Immature Granulocytes: 0 %
Lymphocytes Relative: 22 %
Lymphs Abs: 1.3 10*3/uL (ref 0.7–4.0)
MCH: 28.9 pg (ref 26.0–34.0)
MCHC: 33.5 g/dL (ref 30.0–36.0)
MCV: 86.1 fL (ref 80.0–100.0)
Monocytes Absolute: 0.6 10*3/uL (ref 0.1–1.0)
Monocytes Relative: 9 %
Neutro Abs: 4.3 10*3/uL (ref 1.7–7.7)
Neutrophils Relative %: 69 %
Platelets: 273 10*3/uL (ref 150–400)
RBC: 3.81 MIL/uL — ABNORMAL LOW (ref 4.22–5.81)
RDW: 13 % (ref 11.5–15.5)
WBC: 6.2 10*3/uL (ref 4.0–10.5)
nRBC: 0 % (ref 0.0–0.2)

## 2019-07-08 LAB — RAPID URINE DRUG SCREEN, HOSP PERFORMED
Amphetamines: NOT DETECTED
Barbiturates: NOT DETECTED
Benzodiazepines: NOT DETECTED
Cocaine: POSITIVE — AB
Opiates: NOT DETECTED
Tetrahydrocannabinol: NOT DETECTED

## 2019-07-08 LAB — URINE CULTURE

## 2019-07-08 LAB — PHOSPHORUS
Phosphorus: 3.2 mg/dL (ref 2.5–4.6)
Phosphorus: 3.1 mg/dL (ref 2.5–4.6)

## 2019-07-08 LAB — MAGNESIUM
Magnesium: 1.6 mg/dL — ABNORMAL LOW (ref 1.7–2.4)
Magnesium: 1.6 mg/dL — ABNORMAL LOW (ref 1.7–2.4)

## 2019-07-08 LAB — HIV ANTIBODY (ROUTINE TESTING W REFLEX): HIV Screen 4th Generation wRfx: NONREACTIVE

## 2019-07-08 MED ORDER — SODIUM CHLORIDE 0.9 % IV SOLN: INTRAVENOUS | Status: DC

## 2019-07-08 MED ORDER — METOPROLOL TARTRATE 5 MG/5ML IV SOLN
5.0000 mg | INTRAVENOUS | Status: AC | PRN
  Administered 2019-07-08: 5 mg via INTRAVENOUS
  Filled 2019-07-08: qty 5

## 2019-07-08 MED ORDER — POTASSIUM CHLORIDE 10 MEQ/100ML IV SOLN
10.0000 meq | INTRAVENOUS | Status: AC
  Administered 2019-07-08 (×4): 10 meq via INTRAVENOUS
  Filled 2019-07-08 (×4): qty 100

## 2019-07-08 MED ORDER — MAGNESIUM SULFATE 2 GM/50ML IV SOLN
2.0000 g | Freq: Once | INTRAVENOUS | Status: AC
  Administered 2019-07-08: 2 g via INTRAVENOUS
  Filled 2019-07-08: qty 50

## 2019-07-08 MED ORDER — SODIUM CHLORIDE (PF) 0.9 % IJ SOLN
INTRAMUSCULAR | Status: AC
  Filled 2019-07-08: qty 50

## 2019-07-08 MED ORDER — IOHEXOL 300 MG/ML  SOLN
100.0000 mL | Freq: Once | INTRAMUSCULAR | Status: AC | PRN
  Administered 2019-07-08: 80 mL via INTRAVENOUS

## 2019-07-08 NOTE — Progress Notes (Signed)
PT ST from 120's to 140's as high at 150's, pt has been alseep. Pt has been ST since arriving to the unit and before in ED.  Last BP 116/96 @ 0415, CIWA scores 0. Md on call paged will continue to monitor.  Appears that ED MD in H&P note was aware of PT HR upon admission.  Lopressor ordered  5mg  q5 min once for HR above 120  HR 134-143 prior to 5mg  , HR maintaining 120-134, admission EKG 07/07/2019 shows ST and well as EKG in Feb 2021 showed ST  0555 HR showing 88-91

## 2019-07-08 NOTE — Progress Notes (Signed)
PROGRESS NOTE    Devon Peterson  [id number] DOB: 1956/02/29 DOA: 07/07/2019 PCP: Patient, No Pcp Per  Brief Narrative:  HPI per Dr. Shela Leff on 07/07/19 Devon Peterson is a 64 y.o. male with medical history significant of COPD, polysubstance abuse (alcohol, tobacco, cocaine), hospital admission 04/17/2019-04/19/2019 for sepsis secondary to pneumonia and COPD exacerbation presenting to the ED via EMS for evaluation of cough, shortness of breath, fatigue, and unintentional weight loss.  History provided by patient and his nephew at bedside.  Nephew states patient was admitted to the hospital a month ago for pneumonia and since then has continued to do poorly.  He has difficulty swallowing food and has been spitting up saliva.  He is coughing a lot.  Patient denies fevers, chills, shortness of breath, chest pain, nausea, vomiting, abdominal pain, diarrhea, or dysuria.  Nephew states patient has been smoking a pack of cigarettes daily since a very young age.  He has lost a lot of weight recently. Addendum: 07/07/2019 8:40 PM.  Patient also denied history of hematemesis, hematochezia, or melena.  ED Course: Tachycardic on arrival with heart rate in the 150s, appeared to be sinus rhythm.  Afebrile.  Slightly tachypneic.  Labs showing no leukocytosis.  Initial lactic acid 2.5, repeat after IV fluid pending.  Hemoglobin 12.7, stable compared to labs done in February 2021.  Platelet count normal.  BUN 27, creatinine 0.8.  Albumin 3.3.  LFTs normal.  Blood culture x2 pending.  SARS-CoV-2 PCR test negative.  Influenza panel negative.  UA not suggestive of infection.  Urine culture pending. Chest x-ray showing new left lower lobe airspace opacity concerning for pneumonia. CT angiogram chest negative for PE.  Showing abnormal soft tissue in the hypopharynx suspicious for underlying mass.  Dense bilateral lower lobe consolidation, left greater than right.  Aspiration suspected given findings in the hypopharynx.   Also showing evidence of an enhancing mass within the densely consolidated left lower lobe, metastatic disease or primary neoplasm not excluded.  Marked cachexia.  Patient received ceftriaxone, azithromycin, and 2 L normal saline boluses.  **Interim History   Assessment & Plan:   Principal Problem:   Aspiration pneumonia (HCC) Active Problems:   Sepsis ([place])   Dysphagia   Severe protein-calorie malnutrition ([place])   Alcohol use   Pressure injury of skin  Sepsis 2/2 to Aspiration Pneumonia -Was Tachycardic and tachypneic on arrival.  No leukocytosis on labs.  -Does have mild lactic acidosis.   -CT showing dense bilateral lower lobe consolidation, left greater than right suspicious for aspiration pneumonia given given findings suspicious for an abnormal soft tissue mass in the hypopharynx.  -SARS-CoV-2 PCR test negative.  Influenza panel negative.   -Currently satting in the low to mid 90s on room air. -Heart rate has improved after IV fluid boluses.   -Continue IV fluid hydration and antibiotic coverage with Unasyn.   -Trend lactate.   -Blood culture x2 pending.   -CT Neck showed "Extension of pneumonia into the left upper lobe. Probable small left pleural effusion. Maxillary sinus air-fluid levels, a nonspecific finding that can reflect acute sinusitis in the appropriate setting." -CT Abd and Pelvis showed "Interval worsening of bibasilar airspace consolidation left base worse than right likely due to pneumonia. Stable rim enhancing low-density focus over the left base consolidation which may be due to necrosis, pulmonary abscess or underlying malignancy.   No evidence of metastatic disease within the abdomen. Bilateral streaky cortical low-attenuation over the kidneys which can be seen in pyelonephritis. Recommend  clinical correlation.  Aortic Atherosclerosis (ICD10-I70.0). Findings compatible patient's clinical cachexia with significant decreased subcutaneous and mesenteric  fat." -Continuous pulse ox, supplemental oxygen if needed. -Obtain SLP Evaluation and MBS; after MBS was done SLP recommending n.p.o. and ice chips as needed after oral care -Continue to Monitor and repeat CXR in AM -PT OT recommending 1230  Concern for underlying malignancy: -CT angiogram showing abnormal soft tissue in the hypopharynx suspicious for an underlying mass.  -Also showing evidence of an enhancing mass within the densely consolidated left lower lobe, metastatic disease or primary neoplasm not excluded.   -Patient is severely cachectic.  Endorsing dysphagia and unintentional weight loss.   -Longstanding history of smoking cigarettes. -Ordered CT soft tissue neck with contrast and showed "Suboptimal evaluation due to motion artifact and poor soft tissue resolution in the setting of significant cachexia and probable diffuse edema. Enhancing hypopharyngeal soft tissue in the post cricoid region extending superiorly with effacement of the piriform sinuses and thickening of the aryepiglottic folds. Malignancy is suspected and direct visual inspection is recommended. Extension of pneumonia into the left upper lobe. Probable small left pleural effusion. Maxillary sinus air-fluid levels, a nonspecific finding that can reflect acute sinusitis in the appropriate setting." -CT of the abdomen pelvis showed "Interval worsening of bibasilar airspace consolidation left base worse than right likely due to pneumonia. Stable rim enhancing low-density focus over the left base consolidation which may be due to necrosis, pulmonary abscess or underlying malignancy.   No evidence of metastatic disease within the abdomen. Bilateral streaky cortical low-attenuation over the kidneys which can be seen in pyelonephritis. Recommend clinical correlation.  Aortic Atherosclerosis (ICD10-I70.0). Findings compatible patient's clinical cachexia with significant decreased subcutaneous and mesenteric fat."  Dysphagia -Keep  n.p.o. at this time, SLP done and MBS and recommending NPO  Severe protein calorie malnutrition/Underweight -Patient has poor oral intake, unintentional weight loss, and severe cachexia.   -Nutrition consult ordered and recommending advancing diet as medically feasible and if appropriate order diet they are recommending Ensure alive p.o. twice daily, Magic cup twice daily with meals, Juven twice daily, and recommending continue to monitor magnesium, potassium and phosphorus for least 3 days as patient is at risk for refeeding syndrome given his alcohol abuse  Generalized weakness/physical deconditioning:  -PT and OT evaluation recommending Home Health PT  Alcohol use disorder -Tachycardic on arrival, heart rate improved after fluid boluses.  No other signs of withdrawal at this time. -Order CIWA protocol; Ativan as needed.  Thiamine, folate, and multivitamin.  Tobacco Use -Patient has been counseled on smoking cessation.   -NicoDerm patch ordered.  History of cocaine use -Check UDS and was POSITIVE -Will avoid further Beta Blockers   COPD -Stable.   -No wheezing at this time.   -DuoNebs as needed.  Hypokalemia  -Patient's potassium this morning was 3.4 -Replete with IV KCl 40 mEq -Continue to monitor and replete as necessary -Repeat CMP in a.m.  Hypomagnesemia -Patient's magnesium level is 1.6 -Replete with IV mag sulfate 2 g -Continue to monitor and replete as necessary -Repeat magnesium level in a.m.  Normocytic Anemia -Patient's hemoglobin/hematocrit is now 11.0/32.9 -Check anemia panel in the a.m.  -Continue to monitor for signs and symptoms of bleeding; currently no overt bleeding noted  -Repeat CBC in a.m.  DVT prophylaxis: Enoxaparin 40 mg sq q24h  Code Status: FULL CODE  Family Communication: No family present at bedside  Disposition Plan: Patient is from home now here with aspiration pneumonia and likely malignancy.  He  is unsafe to swallow.  Will need  ENT evaluation and likely biopsy and further oncology evaluation.  May require PEG tube placement   Status is: Inpatient  Remains inpatient appropriate because:Persistent severe electrolyte disturbances, IV treatments appropriate due to intensity of illness or inability to take PO and Inpatient level of care appropriate due to severity of illness   Dispo: The patient is from: Home              Anticipated d/c is to: Home              Anticipated d/c date is: 3 days              Patient currently is not medically stable to d/c.  Consultants:   ENT  Discussed Case with Oncology Dr. Alen Blew    Procedures:  CT Neck and CT Abd/Pelvis  Antimicrobials:  Anti-infectives (From admission, onward)   Start     Dose/Rate Route Frequency Ordered Stop   07/07/19 2200  ampicillin-sulbactam (UNASYN) 1.5 g in sodium chloride 0.9 % 100 mL IVPB     1.5 g 200 mL/hr over 30 Minutes Intravenous Every 6 hours 07/07/19 2051     07/07/19 1930  metroNIDAZOLE (FLAGYL) IVPB 500 mg  Status:  Discontinued     500 mg 100 mL/hr over 60 Minutes Intravenous  Once 07/07/19 1922 07/07/19 2003   07/07/19 1800  cefTRIAXone (ROCEPHIN) 1 g in sodium chloride 0.9 % 100 mL IVPB     1 g 200 mL/hr over 30 Minutes Intravenous  Once 07/07/19 1750 07/07/19 1830   07/07/19 1800  azithromycin (ZITHROMAX) 500 mg in sodium chloride 0.9 % 250 mL IVPB     500 mg 250 mL/hr over 60 Minutes Intravenous  Once 07/07/19 1750 07/07/19 1855     Subjective: Seen and examined at bedside and he was hungry.  No nausea or vomiting.  Denies any pain currently.  States that he is lost a lot of weight.  No other concerns or complaints at this time.  Objective: Vitals:   07/08/19 0819 07/08/19 1130 07/08/19 1140 07/08/19 1552  BP: 121/89 (!) 135/94  135/86  Pulse: 90 (!) 125  83  Resp: 20 (!) 22  20  Temp: 97.7 F (36.5 C)  (!) 97.3 F (36.3 C) (!) 97.2 F (36.2 C)  TempSrc: Oral  Oral Oral  SpO2: 90% 94%  99%  Weight:      Height:         Intake/Output Summary (Last 24 hours) at 07/08/2019 1805 Last data filed at 07/08/2019 1512 Gross per 24 hour  Intake 2317.86 ml  Output 1125 ml  Net 1192.86 ml   Filed Weights   07/07/19 2049  Weight: 42 kg   Examination: Physical Exam:  Constitutional: Thin cachectic African-American male who is chronically ill-appearing in no acute distress but appears somewhat uncomfortable and does appear anxious Eyes: Lids and conjunctivae normal, sclerae anicteric  ENMT: External Ears, Nose appear normal. Grossly normal hearing.  Neck: Appears normal, supple, no cervical masses, normal ROM, no appreciable thyromegaly neck: No JVD Respiratory: Diminished to auscultation bilaterally with coarse breath sounds and some rhonchi.  Patient is slightly tachypneic but not wearing any supplemental oxygen via nasal cannula.  Cardiovascular: RRR, no murmurs / rubs / gallops. S1 and S2 auscultated.  No appreciable lower extremity edema Abdomen: Soft, non-tender, non-distended.  Bowel sounds positive.  GU: Deferred. Musculoskeletal: No clubbing / cyanosis of digits/nails. No joint deformity upper and lower extremities.  Skin: No rashes, lesions, ulcers on limited skin evaluation. No induration; Warm and dry.  Neurologic: CN 2-12 grossly intact with no focal deficits. Romberg sign and cerebellar reflexes not assessed.  Psychiatric: Normal judgment and insight. Alert and oriented x 3.  Anxious mood and appropriate affect.   Data Reviewed: I have personally reviewed following labs and imaging studies  CBC: Recent Labs  Lab 07/07/19 1650 07/08/19 0924  WBC 8.4 6.2  NEUTROABS 6.3 4.3  HGB 12.7* 11.0*  HCT 38.8* 32.8*  MCV 87.2 86.1  PLT 276 409   Basic Metabolic Panel: Recent Labs  Lab 07/07/19 1650 07/08/19 0412 07/08/19 0924  NA 138  --  144  K 4.8  --  3.4*  CL 100  --  107  CO2 25  --  24  GLUCOSE 100*  --  64*  BUN 27*  --  19  CREATININE 0.86  --  0.60*  CALCIUM 8.9  --  8.8*  MG   --  1.6* 1.6*  PHOS  --  3.2 3.1   GFR: Estimated Creatinine Clearance: 55.4 mL/min (A) (by C-G formula based on SCr of 0.6 mg/dL (L)). Liver Function Tests: Recent Labs  Lab 07/07/19 1650 07/08/19 0924  AST 18 17  ALT 11 10  ALKPHOS 74 65  BILITOT 0.9 1.1  PROT 7.8 6.7  ALBUMIN 3.3* 2.9*   No results for input(s): LIPASE, AMYLASE in the last 168 hours. No results for input(s): AMMONIA in the last 168 hours. Coagulation Profile: Recent Labs  Lab 07/07/19 1650  INR 1.0   Cardiac Enzymes: No results for input(s): CKTOTAL, CKMB, CKMBINDEX, TROPONINI in the last 168 hours. BNP (last 3 results) No results for input(s): PROBNP in the last 8760 hours. HbA1C: No results for input(s): HGBA1C in the last 72 hours. CBG: No results for input(s): GLUCAP in the last 168 hours. Lipid Profile: No results for input(s): CHOL, HDL, LDLCALC, TRIG, CHOLHDL, LDLDIRECT in the last 72 hours. Thyroid Function Tests: No results for input(s): TSH, T4TOTAL, FREET4, T3FREE, THYROIDAB in the last 72 hours. Anemia Panel: No results for input(s): VITAMINB12, FOLATE, FERRITIN, TIBC, IRON, RETICCTPCT in the last 72 hours. Sepsis Labs: Recent Labs  Lab 07/07/19 1650 07/07/19 1850  LATICACIDVEN 2.5* 1.7    Recent Results (from the past 240 hour(s))  Blood Culture (routine x 2)     Status: None (Preliminary result)   Collection Time: 07/07/19  4:45 PM   Specimen: BLOOD LEFT HAND  Result Value Ref Range Status   Specimen Description   Final    BLOOD LEFT HAND Performed at [facility] [address], [place], [place] [address]    Special Requests   Final    BOTTLES DRAWN AEROBIC ONLY Blood Culture results may not be optimal due to an inadequate volume of blood received in culture bottles Performed at [facility] [address], [place], [place] [address]    Culture   Final    NO GROWTH < 24 HOURS Performed at [facility] [address],  [place], [place] [address]    Report Status PENDING  Incomplete  Blood Culture (routine x 2)     Status: None (Preliminary result)   Collection Time: 07/07/19  4:46 PM   Specimen: BLOOD  Result Value Ref Range Status   Specimen Description   Final    BLOOD RIGHT ANTECUBITAL Performed at [facility] [address] [place], [place] [address]  Special Requests   Final    BOTTLES DRAWN AEROBIC AND ANAEROBIC Blood Culture results may not be optimal due to an inadequate volume of blood received in culture bottles Performed at [facility] [address] [place], [place] [address]    Culture   Final    NO GROWTH < 24 HOURS Performed at [facility] [address] [place], [place] [address]    Report Status PENDING  Incomplete  Respiratory Panel by RT PCR (Flu A&B, Covid) - Nasopharyngeal Swab     Status: None   Collection Time: 07/07/19  5:14 PM   Specimen: Nasopharyngeal Swab  Result Value Ref Range Status   SARS Coronavirus 2 by RT PCR NEGATIVE NEGATIVE Final    Comment: (NOTE) SARS-CoV-2 target nucleic acids are NOT DETECTED. The SARS-CoV-2 RNA is generally detectable in upper respiratoy specimens during the acute phase of infection. The lowest concentration of SARS-CoV-2 viral copies this assay can detect is 131 copies/mL. A negative result does not preclude SARS-Cov-2 infection and should not be used as the sole basis for treatment or other patient management decisions. A negative result may occur with  improper specimen collection/handling, submission of specimen other than nasopharyngeal swab, presence of viral mutation(s) within the areas targeted by this assay, and inadequate number of viral copies (<131 copies/mL). A negative result must be combined with clinical observations, patient history, and epidemiological information. The expected result is Negative. Fact Sheet for Patients:   PinkCheek.be Fact Sheet for Healthcare Providers:  GravelBags.it This test is not yet ap proved or cleared by the [place] FDA and  has been authorized for detection and/or diagnosis of SARS-CoV-2 by FDA under an Emergency Use Authorization (EUA). This EUA will remain  in effect (meaning this test can be used) for the duration of the COVID-19 declaration under Section 564(b)(1) of the Act, 21 U.S.C. section 360bbb-3(b)(1), unless the authorization is terminated or revoked sooner.    Influenza A by PCR NEGATIVE NEGATIVE Final   Influenza B by PCR NEGATIVE NEGATIVE Final    Comment: (NOTE) The Xpert Xpress SARS-CoV-2/FLU/RSV assay is intended as an aid in  the diagnosis of influenza from Nasopharyngeal swab specimens and  should not be used as a sole basis for treatment. Nasal washings and  aspirates are unacceptable for Xpert Xpress SARS-CoV-2/FLU/RSV  testing. Fact Sheet for Patients: PinkCheek.be Fact Sheet for Healthcare Providers: GravelBags.it This test is not yet approved or cleared by the [place] FDA and  has been authorized for detection and/or diagnosis of SARS-CoV-2 by  FDA under an Emergency Use Authorization (EUA). This EUA will remain  in effect (meaning this test can be used) for the duration of the  Covid-19 declaration under Section 564(b)(1) of the Act, 21  U.S.C. section 360bbb-3(b)(1), unless the authorization is  terminated or revoked. Performed at [facility], [place] [address], [place], [place] [address]   Urine culture     Status: Abnormal   Collection Time: 07/07/19  5:29 PM   Specimen: In/Out Cath Urine  Result Value Ref Range Status   Specimen Description   Final    IN/OUT CATH URINE Performed at [facility] [address] [place], [place] [address]    Special Requests   Final     NONE Performed at [facility], [place] [address] [place],  [address]    Culture MULTIPLE SPECIES PRESENT, SUGGEST RECOLLECTION (A)  Final   Report Status 07/08/2019 FINAL  Final     RN Pressure Injury Documentation: Pressure Injury 07/07/19 Sacrum Mid;Upper Stage 2 -  Partial thickness loss of dermis presenting as a shallow open injury with a red, pink wound bed without slough. 3 each small separate open areas (Active)  07/07/19 2107  Location: Sacrum  Location Orientation: Mid;Upper  Staging: Stage 2 -  Partial thickness loss of dermis presenting as a shallow open injury with a red, pink wound bed without slough.  Wound Description (Comments): 3 each small separate open areas  Present on Admission: Yes     Estimated body mass index is 12.91 kg/m as calculated from the following:   Height as of this encounter: 5\' 11"  (1.803 m).   Weight as of this encounter: 42 kg.  Malnutrition Type:  Nutrition Problem: Severe Malnutrition Etiology: chronic illness(COPD)   Malnutrition Characteristics:  Signs/Symptoms: severe fat depletion, severe muscle depletion   Nutrition Interventions:  Interventions: Refer to RD note for recommendations   Radiology Studies: CT Soft Tissue Neck W Contrast  Result Date: 07/08/2019 CLINICAL DATA:  Neck mass EXAM: CT NECK WITH CONTRAST TECHNIQUE: Multidetector CT imaging of the neck was performed using the standard protocol following the bolus administration of intravenous contrast. CONTRAST:  36mL OMNIPAQUE IOHEXOL 300 MG/ML  SOLN COMPARISON:  None. FINDINGS: Evaluation is limited by poor soft tissue resolution in the setting significant cachexia and probable diffuse edema. Additionally, motion artifact is present. Pharynx and larynx: There is enhancing soft tissue in the post cricoid region extending superiorly with effacement of the piriform sinuses and thickening of the aryepiglottic folds. Salivary glands: Unremarkable. Thyroid:  Unremarkable. Lymph nodes: No definite enlarged lymph nodes. Vascular: Major neck vessels are patent. There is calcified plaque at the right greater than left ICA origins. There may be significant stenosis on the right. Limited intracranial: No abnormal enhancement. Visualized orbits: Unremarkable. Mastoids and visualized paranasal sinuses: Bilateral maxillary sinus air-fluid levels. Visualized mastoid air cells are clear. Skeleton: Multilevel degenerative changes of the cervical spine. Upper chest: Emphysema. New extension of centrilobular/tree-in-bud opacities within the left upper lobe. Probable small left pleural effusion Other: None. IMPRESSION: Suboptimal evaluation due to motion artifact and poor soft tissue resolution in the setting of significant cachexia and probable diffuse edema. Enhancing hypopharyngeal soft tissue in the post cricoid region extending superiorly with effacement of the piriform sinuses and thickening of the aryepiglottic folds. Malignancy is suspected and direct visual inspection is recommended. Extension of pneumonia into the left upper lobe. Probable small left pleural effusion. Maxillary sinus air-fluid levels, a nonspecific finding that can reflect acute sinusitis in the appropriate setting. Electronically Signed   By: Macy Mis M.D.   On: 07/08/2019 13:45   CT Angio Chest PE W and/or Wo Contrast  Result Date: 07/07/2019 CLINICAL DATA:  Failure to thrive, history of pneumonia, cough EXAM: CT ANGIOGRAPHY CHEST WITH CONTRAST TECHNIQUE: Multidetector CT imaging of the chest was performed using the standard protocol during bolus administration of intravenous contrast. Multiplanar CT image reconstructions and MIPs were obtained to evaluate the vascular anatomy. CONTRAST:  10mL OMNIPAQUE IOHEXOL 350 MG/ML SOLN COMPARISON:  07/07/2019 FINDINGS: Cardiovascular: This is a technically adequate evaluation of the pulmonary vasculature. No filling defects or pulmonary emboli. The heart is  unremarkable without pericardial effusion. Mild calcification of the mitral and aortic valves. Mild atherosclerosis of the coronary vasculature. Mediastinum/Nodes: No enlarged mediastinal, hilar, or axillary lymph nodes. Thyroid gland, trachea, and esophagus demonstrate no significant findings. Because of marked thoracic kyphosis and positioning, a significant portion  of the hypopharynx is included on this exam. There is asymmetric soft tissue in the supraglottic region, incompletely evaluated on this study. I am suspicious of a mass in the right hypopharynx, reference image 15. Follow-up CT neck with contrast or direct visual inspection is recommended. Lungs/Pleura: There is severe background emphysema. Bilateral lower lobe consolidation is seen, left greater than right. Given the above findings in the hypopharynx, aspiration should be considered in addition to community acquired pneumonia. Within the densely consolidated left lower lobe there is evidence of an enhancing 2.7 cm mass, which could reflect metastatic disease. This is best seen on image 126 of series 4. No effusion or pneumothorax. Upper Abdomen: Patient is markedly cachectic. No gross abnormalities are visualized. Musculoskeletal: No acute or destructive bony lesions. Reconstructed images demonstrate no additional findings. Review of the MIP images confirms the above findings. IMPRESSION: 1. No evidence of pulmonary embolus. 2. Abnormal soft tissue in the hypopharynx, suspicious for underlying mass. CT neck or direct visual inspection recommended for further evaluation. 3. Dense bilateral lower lobe consolidation, left greater than right. Aspiration is suspected given findings in the hypopharynx. 4. There is evidence of an enhancing mass within the densely consolidated left lower lobe, metastatic disease or primary neoplasm not excluded. 5. Marked cachexia. Electronically Signed   By: Randa Ngo M.D.   On: 07/07/2019 19:12   CT ABDOMEN PELVIS W  CONTRAST  Result Date: 07/08/2019 CLINICAL DATA:  Lymphoma staging. EXAM: CT ABDOMEN AND PELVIS WITH CONTRAST TECHNIQUE: Multidetector CT imaging of the abdomen and pelvis was performed using the standard protocol following bolus administration of intravenous contrast. CONTRAST:  22mL OMNIPAQUE IOHEXOL 300 MG/ML  SOLN COMPARISON:  CT chest 07/07/2019 FINDINGS: Lower chest: Lung bases demonstrate interval worsening bibasilar consolidation left much worse than right. Rim enhancing low-density structure within the dense consolidation in the left base without significant change which could represent underlying malignancy versus necrotic infection or pulmonary abscess. Calcification of the mitral valve annulus. Hepatobiliary: Liver, gallbladder and biliary tree are normal. Pancreas: Normal. Spleen: Normal. Adrenals/Urinary Tract: Adrenal glands are unremarkable. Kidneys are normal in size and demonstrate bilateral streaky cortical low-attenuation which can be seen in pyelonephritis. No evidence of hydronephrosis. No definite Peri nephric inflammation or fluid. Ureters are not well visualized. Contrast is present within the bladder from patient's recent chest CT. Stomach/Bowel: Stomach and small bowel are unremarkable. Appendix is not well visualized. Colon is unremarkable. Moderate fecal retention over the rectum. Vascular/Lymphatic: Mild calcified plaque over the abdominal aorta which is normal caliber. No definite adenopathy. Reproductive: Normal. Other: Evidence of cachexia with significant decrease mesenteric fat and subcutaneous fat present. Musculoskeletal: Degenerative change of the spine and hips. IMPRESSION: 1. Interval worsening of bibasilar airspace consolidation left base worse than right likely due to pneumonia. Stable rim enhancing low-density focus over the left base consolidation which may be due to necrosis, pulmonary abscess or underlying malignancy. 2.  No evidence of metastatic disease within the  abdomen. 3. Bilateral streaky cortical low-attenuation over the kidneys which can be seen in pyelonephritis. Recommend clinical correlation. 4.  Aortic Atherosclerosis (ICD10-I70.0). 5. Findings compatible patient's clinical cachexia with significant decreased subcutaneous and mesenteric fat. Electronically Signed   By: Marin Olp M.D.   On: 07/08/2019 13:03   DG Chest Port 1 View  Result Date: 07/07/2019 CLINICAL DATA:  Cough. EXAM: PORTABLE CHEST 1 VIEW COMPARISON:  04/18/2019 FINDINGS: There is a new left lower lobe airspace opacity concerning for pneumonia. The heart size is stable. Aortic calcifications  are noted. The lungs are hyperexpanded. There is no pneumothorax. There is no acute osseous abnormality. IMPRESSION: 1. New left lower lobe airspace opacity concerning for pneumonia. Follow-up to radiologic resolution is recommended. 2. COPD. Electronically Signed   By: Constance Holster M.D.   On: 07/07/2019 17:18   DG Swallowing Func-Speech Pathology  Result Date: 07/08/2019 Objective Swallowing Evaluation: Type of Study: MBS-Modified Barium Swallow Study  Patient Details Name: Burton Gahan MRN: [id number] Date of Birth: 04-Apr-1955 Today's Date: 07/08/2019 Time: SLP Start Time (ACUTE ONLY): 1610 -SLP Stop Time (ACUTE ONLY): 1320 SLP Time Calculation (min) (ACUTE ONLY): 25 min Past Medical History: Past Medical History: Diagnosis Date . Medical history non-contributory  Past Surgical History: Past Surgical History: Procedure Laterality Date . NO PAST SURGERIES   HPI: 64 yo with progressive dysphagia, appears cachetic, found to have likely aspiration pna per CT - enhancing mass LLL- suspected aspiration.  . Imaging studies also concerning for soft tissue mass in hypopharynx. Pt has been a consumer of ETOH - beer and smokes cigarettes.   CT neck today showed "Enhancing hypopharyngeal soft tissue in the post cricoid region extending superiorly with effacement of the piriform sinuses andthickening of the  aryepiglottic folds."  Pt desired to eat/drink and swallow evaluation had been ordered. SLP advised an MBS to view impact of mass on pharyngeal swallow.  Subjective: pt awake in chair Assessment / Plan / Recommendation CHL IP CLINICAL IMPRESSIONS 07/08/2019 Clinical Impression Pt presents with gross obstructive based dysphagia secondary to his "Enhancing hypopharyngeal soft tissue in the post cricoid region extending superiorly with effacement of the piriform sinuses and thickening of the aryepiglottic folds"per CT imaging.  Gross pharyngeal-cervical esophageal retention mixed with secretions was aspirated post=swallow as residuals spilled into an open airway. Pt consumption of small cup bolus resulted in improved muscular contraction and thus clearance into esophagus.  Recommend pt be npo except single ice chips after oral care.  Pt was informed to clinical reasoning for recommendations but immediately asked if he could eat - clearly demonstrating compromised understanding. SLP Visit Diagnosis Dysphagia, oropharyngeal phase (R13.12);Dysphagia, pharyngoesophageal phase (R13.14) Attention and concentration deficit following -- Frontal lobe and executive function deficit following -- Impact on safety and function Severe aspiration risk;Risk for inadequate nutrition/hydration   CHL IP TREATMENT RECOMMENDATION 07/08/2019 Treatment Recommendations Therapy as outlined in treatment plan below   Prognosis 07/08/2019 Prognosis for Safe Diet Advancement Guarded Barriers to Reach Goals -- Barriers/Prognosis Comment severity of dysphagia due to pt's mass CHL IP DIET RECOMMENDATION 07/08/2019 SLP Diet Recommendations NPO;Ice chips PRN after oral care Liquid Administration via -- Medication Administration Via alternative means Compensations (No Data) Postural Changes --   CHL IP OTHER RECOMMENDATIONS 07/08/2019 Recommended Consults -- Oral Care Recommendations Oral care QID Other Recommendations --   CHL IP FOLLOW UP RECOMMENDATIONS  07/08/2019 Follow up Recommendations (No Data)   CHL IP FREQUENCY AND DURATION 07/08/2019 Speech Therapy Frequency (ACUTE ONLY) min 2x/week Treatment Duration 2 weeks      CHL IP ORAL PHASE 07/08/2019 Oral Phase Impaired Oral - Pudding Teaspoon -- Oral - Pudding Cup -- Oral - Honey Teaspoon -- Oral - Honey Cup -- Oral - Nectar Teaspoon Reduced posterior propulsion;Weak lingual manipulation;Delayed oral transit;Other (Comment) Oral - Nectar Cup Reduced posterior propulsion;Weak lingual manipulation;Delayed oral transit;Other (Comment);Lingual/palatal residue Oral - Nectar Straw -- Oral - Thin Teaspoon Weak lingual manipulation;Delayed oral transit;Other (Comment);Premature spillage Oral - Thin Cup Delayed oral transit;Weak lingual manipulation;Reduced posterior propulsion;Other (Comment);Lingual/palatal residue Oral - Thin Straw -- Oral -  Puree -- Oral - Mech Soft -- Oral - Regular -- Oral - Multi-Consistency -- Oral - Pill -- Oral Phase - Comment lingual rocking observed across all boluses  CHL IP PHARYNGEAL PHASE 07/08/2019 Pharyngeal Phase Impaired Pharyngeal- Pudding Teaspoon -- Pharyngeal -- Pharyngeal- Pudding Cup -- Pharyngeal -- Pharyngeal- Honey Teaspoon -- Pharyngeal -- Pharyngeal- Honey Cup -- Pharyngeal -- Pharyngeal- Nectar Teaspoon Pharyngeal residue - pyriform;Pharyngeal residue - posterior pharnyx;Pharyngeal residue - cp segment;Penetration/Apiration after swallow;Moderate aspiration;Lateral channel residue;Inter-arytenoid space residue;Reduced airway/laryngeal closure;Reduced epiglottic inversion;Reduced anterior laryngeal mobility;Reduced laryngeal elevation Pharyngeal Material enters airway, passes BELOW cords and not ejected out despite cough attempt by patient;Material enters airway, passes BELOW cords without attempt by patient to eject out (silent aspiration);Material enters airway, passes BELOW cords then ejected out Pharyngeal- Nectar Cup Penetration/Apiration after swallow;Pharyngeal residue -  pyriform;Pharyngeal residue - posterior pharnyx;Pharyngeal residue - cp segment;Inter-arytenoid space residue;Lateral channel residue;Reduced airway/laryngeal closure;Reduced epiglottic inversion;Reduced laryngeal elevation;Reduced anterior laryngeal mobility Pharyngeal Material enters airway, passes BELOW cords without attempt by patient to eject out (silent aspiration) Pharyngeal- Nectar Straw -- Pharyngeal -- Pharyngeal- Thin Teaspoon Penetration/Apiration after swallow;Pharyngeal residue - pyriform;Pharyngeal residue - posterior pharnyx;Pharyngeal residue - cp segment;Inter-arytenoid space residue;Lateral channel residue;Reduced tongue base retraction;Reduced epiglottic inversion;Penetration/Aspiration before swallow;Penetration/Aspiration during swallow;Moderate aspiration;Reduced airway/laryngeal closure;Reduced laryngeal elevation;Reduced anterior laryngeal mobility Pharyngeal Material enters airway, passes BELOW cords without attempt by patient to eject out (silent aspiration);Material enters airway, passes BELOW cords and not ejected out despite cough attempt by patient Pharyngeal- Thin Cup Pharyngeal residue - pyriform;Pharyngeal residue - posterior pharnyx;Pharyngeal residue - valleculae;Pharyngeal residue - cp segment;Inter-arytenoid space residue;Lateral channel residue;Reduced airway/laryngeal closure;Reduced epiglottic inversion;Reduced laryngeal elevation;Reduced anterior laryngeal mobility Pharyngeal Material enters airway, passes BELOW cords without attempt by patient to eject out (silent aspiration);Material enters airway, passes BELOW cords and not ejected out despite cough attempt by patient Pharyngeal- Thin Straw -- Pharyngeal -- Pharyngeal- Puree -- Pharyngeal -- Pharyngeal- Mechanical Soft -- Pharyngeal -- Pharyngeal- Regular -- Pharyngeal -- Pharyngeal- Multi-consistency -- Pharyngeal -- Pharyngeal- Pill -- Pharyngeal -- Pharyngeal Comment --  CHL IP CERVICAL ESOPHAGEAL PHASE 07/08/2019  Cervical Esophageal Phase Impaired Pudding Teaspoon -- Pudding Cup -- Honey Teaspoon -- Honey Cup -- Nectar Teaspoon -- Nectar Cup -- Nectar Straw -- Thin Teaspoon -- Thin Cup -- Thin Straw -- Puree -- Mechanical Soft -- Regular -- Multi-consistency -- Pill -- Cervical Esophageal Comment Pt with minimal clearance of barium into esophagus due to his obstructive mass which results in gross retention of barium mixed with secretions with aspiration post=swallow. Kathleen Lime, MS [facility] SLP Acute Rehab Services Office [phone] [name] 07/08/2019, 2:47 PM              Scheduled Meds: . enoxaparin (LOVENOX) injection  40 mg Subcutaneous Q24H  . folic acid  1 mg Oral Daily  . multivitamin with minerals  1 tablet Oral Daily  . nicotine  14 mg Transdermal Daily  . sodium chloride (PF)      . thiamine  100 mg Oral Daily   Or  . thiamine  100 mg Intravenous Daily   Continuous Infusions: . sodium chloride 75 mL/hr at 07/08/19 1259  . ampicillin-sulbactam (UNASYN) IV 1.5 g (07/08/19 1705)    LOS: 1 day   Kerney Elbe, DO [facility] PAGER is on [place]  If 7PM-7AM, please contact night-coverage [email or web address]

## 2019-07-08 NOTE — TOC Progression Note (Addendum)
Transition of Care [facility]) - Progression Note    Patient Details  Name: Fumio Vandam MRN: [id number] Date of Birth: 04-07-1955  Transition of [facility]) CM/SW Contact  Purcell Mouton, RN Phone Number: 07/08/2019, 3:34 PM  Clinical Narrative:    TOC will continue to follow pt. PT recommend HHPT, however at this present time no [facility] agency is available to take Medicaid. Pt may benefit with OP PT. TOC will continue to follow.    Expected Discharge Plan: Home/Self Care Barriers to Discharge: No Barriers Identified  Expected Discharge Plan and Services Expected Discharge Plan: Home/Self Care                                               Social Determinants of Health (SDOH) Interventions    Readmission Risk Interventions No flowsheet data found.

## 2019-07-08 NOTE — Progress Notes (Signed)
   07/07/19 2115  Assess: MEWS Score  Temp (!) 95 F (35 C)  BP (!) 126/98  Pulse Rate (!) 143  Resp 20  SpO2 99 %  O2 Device Room Air  Assess: MEWS Score  MEWS Temp 1  MEWS Systolic 0  MEWS Pulse 3  MEWS RR 0  MEWS LOC 0  MEWS Score 4  MEWS Score Color Red  Assess: if the MEWS score is Yellow or Red  Were vital signs taken at a resting state? Yes (post admission bath)  MEWS guidelines implemented *See Row Information* Yes  Take Vital Signs  Increase Vital Sign Frequency  Red: Q 1hr X 4 then Q 4hr X 4, if remains red, continue Q 4hrs  Escalate  MEWS: Escalate Red: discuss with charge nurse/RN and provider, consider discussing with RRT  Notify: Provider  Provider Name/Title Ardith Dark   Date Provider Notified 07/07/19  Time Provider Notified 2300  Notification Type Page  Response No new orders    Pt temp was last taken in ED @ 1713 was 98.2 pt was then a MEWS yellow for HR only.  When Pt arrived to the unit he stated he was cold, RNs turned the temp up in the room, Pt needed to have a bath, and new gown and admission skin assessment.  It was after this the vitals were taken, attempted to obtain an oral temp x2 with out being able to get a read.  Preformed a rectal temp and it was 95 degrees.  Immediately initiated MEW Red vitals and provided the pt with warm blankets.  Pt  Rectal temp now 96.1 as of 0015.  Pt still remains ST on monitor up to as high at 150 bpm  02 sats mid 90's to 100% on room air

## 2019-07-08 NOTE — Progress Notes (Addendum)
Pt consented to procedure. Family aware, spoke with Jarome Matin, Alanson Puls [phone], MD updated with contact information for the family. SRP, RN

## 2019-07-08 NOTE — Evaluation (Signed)
Physical Therapy Evaluation Patient Details Name: Devon Peterson MRN: [id number] DOB: [birth year] Today's Date: 07/08/2019   History of Present Illness  64 yo male admitted with aspiration pneumonia, imaging (+) concern for malignancy. Hx of ETOH abuse, COPD, dysphagia, polysubstance abuse.   Clinical Impression  Bed level eval only on today. Pt declined OOB activity due to fatigue. He did agree to allow therapist to assess UE/LE strength and to perform UE/LE exercises. Pt presents with significant muscle wasting, general weakness, decreased activity tolerance. No family present during session. Discussed d/c plan-pt stated he is planning to return home at discharge. Will continue to follow and progress activity as able. May need to consider SNF placement if pt does not perform well on next session.    Follow Up Recommendations Home health PT;Supervision/Assistance - 24 hour(as long as family can continue to provide appropriate care. Pt stated he is planning to return home.)    Equipment Recommendations  (continuing to assess)    Recommendations for Other Services       Precautions / Restrictions Precautions Precautions: Fall Restrictions Weight Bearing Restrictions: No      Mobility  Bed Mobility               General bed mobility comments: NT-pt declined OOB activity on today. He did agree to allow therapist to assess UE/LE strength and perform a few exercises  Transfers                    Ambulation/Gait                Stairs            Wheelchair Mobility    Modified Rankin (Stroke Patients Only)       Balance                                             Pertinent Vitals/Pain Pain Assessment: No/denies pain    Home Living Family/patient expects to be discharged to:: Private residence Living Arrangements: Children;Other relatives Available Help at Discharge: Family Type of Home: House Home Access: Stairs to  enter Entrance Stairs-Rails: Right Entrance Stairs-Number of Steps: 3 Home Layout: One level Home Equipment: Lewis - 2 wheels;Cane - single point      Prior Function           Comments: per pt, getting around home "somewhat". family assisting as needed.     Hand Dominance        Extremity/Trunk Assessment   Upper Extremity Assessment Upper Extremity Assessment: Defer to OT evaluation    Lower Extremity Assessment Lower Extremity Assessment: Generalized weakness       Communication   Communication: (difficult to understand at times)  Cognition Arousal/Alertness: Awake/alert Behavior During Therapy: WFL for tasks assessed/performed Overall Cognitive Status: Within Functional Limits for tasks assessed                                        General Comments      Exercises General Exercises - Upper Extremity Shoulder Flexion: AROM;Both;5 reps;Supine Elbow Flexion: AROM;Both;5 reps;Supine General Exercises - Lower Extremity Ankle Circles/Pumps: AROM;5 reps;10 reps;Supine Quad Sets: AROM;Both;10 reps;Supine   Assessment/Plan    PT Assessment Patient needs continued PT services  PT Problem List Decreased strength;Decreased  mobility;Decreased activity tolerance;Decreased balance;Decreased knowledge of use of DME       PT Treatment Interventions DME instruction;Gait training;Therapeutic activities;Therapeutic exercise;Patient/family education;Balance training;Functional mobility training    PT Goals (Current goals can be found in the Care Plan section)  Acute Rehab PT Goals Patient Stated Goal: to return home once better PT Goal Formulation: With patient Time For Goal Achievement: 07/22/19 Potential to Achieve Goals: Fair    Frequency Min 3X/week   Barriers to discharge        Co-evaluation               AM-PAC PT "6 Clicks" Mobility  Outcome Measure Help needed turning from your back to your side while in a flat bed without  using bedrails?: A Lot Help needed moving from lying on your back to sitting on the side of a flat bed without using bedrails?: A Lot Help needed moving to and from a bed to a chair (including a wheelchair)?: A Lot Help needed standing up from a chair using your arms (e.g., wheelchair or bedside chair)?: A Lot Help needed to walk in hospital room?: A Lot Help needed climbing 3-5 steps with a railing? : A Lot 6 Click Score: 12    End of Session   Activity Tolerance: Patient tolerated treatment well Patient left: in bed;with call bell/phone within reach;with bed alarm set   PT Visit Diagnosis: Muscle weakness (generalized) (M62.81);Difficulty in walking, not elsewhere classified (R26.2)    Time: 3817-7116 PT Time Calculation (min) (ACUTE ONLY): 8 min   Charges:   PT Evaluation $PT Eval Low Complexity: 1 Low             Yazmine Sorey P, PT Acute Rehabilitation

## 2019-07-08 NOTE — Evaluation (Signed)
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

## 2019-07-08 NOTE — Progress Notes (Addendum)
Modified Barium Swallow Progress Note  Patient Details  Name: Devon Peterson MRN: [id number] Date of Birth: 01-22-1956  Today's Date: 07/08/2019  Modified Barium Swallow completed.  Full report located under Chart Review in the Imaging Section.  Brief recommendations include the following:  Clinical Impression  Pt presents with gross obstructive based dysphagia secondary to his "Enhancing hypopharyngeal soft tissue in the post cricoid region extending superiorly with effacement of the piriform sinuses and thickening of the aryepiglottic folds"per CT imaging.  Gross pharyngeal-cervical esophageal retention mixed with secretions was aspirated post=swallow as residuals spilled into an open airway. Pt consumption of small cup bolus resulted in improved muscular contraction and thus clearance into esophagus.    Pt did cough considerably with first aspiration event with thin liquid - secretion aspiration was not consistently sensed.    Recommend pt be npo except single ice chips after oral care .Pt was informed to clinical reasoning for recommendations but immediately asked if he could eat - clearly demonstrating compromised understanding.  Of note, pt is conducted multiple swallows per bolus which does decrease retention.  In addition, he can cough/expectorate orally secretions and barium but does not fully clear.  These compensation strategies have helped him to mitigate his aspiration and SLP advised he maintain these strategies.     SLP recommends consideration for a palliative referral given pt's level of cachexia, weight loss, dysphagia and deconditioning.  Will follow up.    Swallow Evaluation Recommendations       SLP Diet Recommendations: NPO;Ice chips PRN after oral care       Medication Administration: Via alternative means       Compensations: (single ice chips, multiple swallows, cough and clear, etc)       Oral Care Recommendations: Oral care QID      Kathleen Lime, MS [facility]  SLP Acute Rehab Services Office [phone]   [name] 07/08/2019,2:46 PM

## 2019-07-08 NOTE — Plan of Care (Signed)
  Problem: Education: Goal: Knowledge of General Education information will improve Description Including pain rating scale, medication(s)/side effects and non-pharmacologic comfort measures Outcome: Progressing   

## 2019-07-08 NOTE — Progress Notes (Addendum)
Dr. Wilburn Cornelia ENT consulted on patient. Contacted relative Claretta Fraise [phone] to discuss pt case and find contact . Tammy Swiney, relative returned called to update pt Emergency Contact information updated in chart.Bonna Gains, RN

## 2019-07-08 NOTE — Progress Notes (Signed)
Pt previously RED MEWS, has been in procedures throughout the morning, therefore at times VS were not in resting state. Pt is alert and talking. Denies pain, Bil rhonci noted, MD aware and interventions have been ordered and initiated. CN updated. CMT have called to update concerning heart rate. Will cont to monitor and complete treatment plan as ordered by MD. SRP, RN

## 2019-07-08 NOTE — Progress Notes (Signed)
OT Cancellation Note  Patient Details Name: Devon Peterson MRN: [id number] DOB: 12-May-1955   Cancelled Treatment:    Reason Eval/Treat Not Completed: Other (comment);Fatigue/lethargy limiting ability to participate(Patient declined therapy today. Reports too fatigued to participate.)  [name] L Khoi Hamberger 07/08/2019, 3:12 PM

## 2019-07-08 NOTE — Progress Notes (Signed)
Initial Nutrition Assessment  DOCUMENTATION CODES:   Severe malnutrition in context of chronic illness, Underweight  INTERVENTION:  Advance diet as medically feasible, when able and if appropriate to diet order -will provide Ensure Enlive po BID, each supplement provides 350 kcal and 20 grams of protein -will provide Magic cup BID with meals, each supplement provides 290 kcal and 9 grams of protein -will provide Juven BID, each packet provides 95 calories, 2.5 grams of protein (collagen), and 9.8 grams of carbohydrate (3 grams sugar); also contains 7 grams of L-arginine and L-glutamine, 300 mg vitamin C, 15 mg vitamin E, 1.2 mcg vitamin B-12, 9.5 mg zinc, 200 mg calcium, and 1.5 g  Calcium Beta-hydroxy-Beta-methylbutyrate to support wound healing  Continue to monitor magnesium, potassium, and phosphorus daily for at least 3 days, MD to replete as needed, as pt is at risk for refeeding syndrome given history of EtOH abuse,  hypokalemia, hypomagnesemia, and down trending of phosphours per labs.  NUTRITION DIAGNOSIS:   Severe Malnutrition related to chronic illness(COPD) as evidenced by severe fat depletion, severe muscle depletion.    GOAL:   Patient will meet greater than or equal to 90% of their needs    MONITOR:   Diet advancement, Labs, Weight trends, I & O's, Skin  REASON FOR ASSESSMENT:   Consult, Malnutrition Screening Tool Assessment of nutrition requirement/status  ASSESSMENT:  64 year old male with past medical history of COPD, polysubstance abuse, recent admission 2/12-2/14 for sepsis secondary to pneumonia and COPD exacerbation presented with SOB, fatigue, and unintentional wt loss. Family reports patient has done poorly s/p returning home from hospital, has difficulty swallowing food, spitting up saliva, and has chronic cough. CXR revealed new left lower lobe airspace opacity concerning for pneumonia, CT angiogram showed abnormal soft tissue in hypopharynx suspicious  for underlying mass, and dense bilateral lower lobe consolidation.  Patient just returned from CT at [place] visit and reports feeling nauseas. Patient speaks with a whisper and difficult to understand at times. He reports good appetite and intake at home, recalls 3 meals/day. Reports eggs and grits for breakfast, sandwiches for lunch and a good sized dinner. Patient reports liking vanilla flavor supplements and occasionally drinks them at home.  Patient edentulous, reports wearing dentures at home, denied  chewing/swallowing difficulties. He is pending SLP evaluation per RN report. Will continue to monitor for diet texture recommendations per SLP and provide supplements with diet advancement.  Current wt 92.4 lbs Patient endorses recent weight losses, unable to recall usual weight. Limited weight history for review, on 04/18/19 pt weighed 97.9 lbs. This indicates a 5.5 lb (5.6%) wt loss in 3 months which is insignificant although concerning given patient is underweight with severe fat/muscle depletions to entire body noted on exam.   Medications reviewed and include: folic acid, MVI, B1 IVF: NaCl IVPB: Unasyn, Magnesium sulfate, KCl Labs: K 3.4 (L), Mg 1.6 (L) P 3.1 (WNL) trending down  NUTRITION - FOCUSED PHYSICAL EXAM:    Most Recent Value  Orbital Region  Severe depletion  Upper Arm Region  Severe depletion  Thoracic and Lumbar Region  Severe depletion  Buccal Region  Unable to assess [pt is edentulous]  Temple Region  Severe depletion  Clavicle Bone Region  Severe depletion  Clavicle and Acromion Bone Region  Severe depletion  Scapular Bone Region  Severe depletion  Dorsal Hand  Severe depletion  Patellar Region  Severe depletion  Anterior Thigh Region  Severe depletion  Posterior Calf Region  Severe depletion  Edema (  RD Assessment)  None  Hair  Reviewed  Eyes  Reviewed  Mouth  Reviewed  Skin  Reviewed  Nails  Reviewed       Diet Order:   Diet Order            Diet NPO time  specified  Diet effective now              EDUCATION NEEDS:   Not appropriate for education at this time  Skin:  Skin Assessment: Skin Integrity Issues: Skin Integrity Issues:: Stage II Stage II: mid sacrum  Last BM:  PTA  Height:   Ht Readings from Last 1 Encounters:  07/07/19 5\' 11"  (1.803 m)    Weight:   Wt Readings from Last 1 Encounters:  07/07/19 42 kg    BMI:  Body mass index is 12.91 kg/m.  Estimated Nutritional Needs:   Kcal:  1700-1900  Protein:  80-100  Fluid:  >/= 1.7 L/day    Lajuan Lines, RD, LDN Clinical Nutrition After Hours/Weekend Pager # in [phone]

## 2019-07-08 NOTE — Consult Note (Signed)
ENT CONSULT:  Reason for Consult: Dysphagia Referring Physician: Hospital service  Devon Peterson is an 64 y.o. male.   HPI: Patient admitted to [facility] with a history of progressive cachexia and dysphagia.  Previously admitted for aspiration pneumonia.  Patient longtime smoking history.  Patient reports difficulty swallowing and frequent coughing.  No significant pain, airway issues or voice change by his report.  Past Medical History:  Diagnosis Date  . Medical history non-contributory     Past Surgical History:  Procedure Laterality Date  . NO PAST SURGERIES      Family History  Problem Relation Age of Onset  . CAD Father     Social History:  reports that he has been smoking cigarettes. He has been smoking about 0.50 packs per day. He has never used smokeless tobacco. He reports current alcohol use of about 24.0 standard drinks of alcohol per week. He reports previous drug use.  Allergies: No Known Allergies  Medications: I have reviewed the patient's current medications.  Results for orders placed or performed during the hospital encounter of 07/07/19 (from the past 48 hour(s))  Blood Culture (routine x 2)     Status: None (Preliminary result)   Collection Time: 07/07/19  4:45 PM   Specimen: BLOOD LEFT HAND  Result Value Ref Range   Specimen Description      BLOOD LEFT HAND Performed at [facility], [place] [address], [place], [place] [address]    Special Requests      BOTTLES DRAWN AEROBIC ONLY Blood Culture results may not be optimal due to an inadequate volume of blood received in culture bottles Performed at [place] [address], [place], [place] [address]    Culture      NO GROWTH < 24 HOURS Performed at [place] [address], [place], [place] [address]    Report Status PENDING   Blood Culture (routine x 2)     Status: None (Preliminary result)   Collection Time: 07/07/19  4:46 PM   Specimen:  BLOOD  Result Value Ref Range   Specimen Description      BLOOD RIGHT ANTECUBITAL Performed at [place] [address], [place], [place] [address]    Special Requests      BOTTLES DRAWN AEROBIC AND ANAEROBIC Blood Culture results may not be optimal due to an inadequate volume of blood received in culture bottles Performed at [facility], [place] [address] [place], [place] [address]    Culture      NO GROWTH < 24 HOURS Performed at [place] [address], [place], [place] [address]    Report Status PENDING   Lactic acid, plasma     Status: Abnormal   Collection Time: 07/07/19  4:50 PM  Result Value Ref Range   Lactic Acid, Venous 2.5 (HH) 0.5 - 1.9 mmol/L    Comment: CRITICAL RESULT CALLED TO, READ BACK BY AND VERIFIED WITH: K.ZULETA AT 1803 ON 07/07/19 BY N.THOMPSON Performed at [facility], [place] [address] [place], [place] [address]   Comprehensive metabolic panel     Status: Abnormal   Collection Time: 07/07/19  4:50 PM  Result Value Ref Range   Sodium 138 135 - 145 mmol/L   Potassium 4.8 3.5 - 5.1 mmol/L   Chloride 100 98 - 111 mmol/L   CO2 25 22 - 32 mmol/L   Glucose, Bld 100 (H) 70 - 99 mg/dL  Comment: Glucose reference range applies only to samples taken after fasting for at least 8 hours.   BUN 27 (H) 8 - 23 mg/dL   Creatinine, Ser 0.86 0.61 - 1.24 mg/dL   Calcium 8.9 8.9 - 10.3 mg/dL   Total Protein 7.8 6.5 - 8.1 g/dL   Albumin 3.3 (L) 3.5 - 5.0 g/dL   AST 18 15 - 41 U/L   ALT 11 0 - 44 U/L   Alkaline Phosphatase 74 38 - 126 U/L   Total Bilirubin 0.9 0.3 - 1.2 mg/dL   GFR calc non Af Amer >60 >60 mL/min   GFR calc Af Amer >60 >60 mL/min   Anion gap 13 5 - 15    Comment: Performed at [facility], [place] [address], [place], [place] [address]  CBC WITH DIFFERENTIAL     Status: Abnormal   Collection Time: 07/07/19  4:50 PM  Result Value Ref Range   WBC 8.4 4.0 - 10.5 K/uL    RBC 4.45 4.22 - 5.81 MIL/uL   Hemoglobin 12.7 (L) 13.0 - 17.0 g/dL   HCT 38.8 (L) 39.0 - 52.0 %   MCV 87.2 80.0 - 100.0 fL   MCH 28.5 26.0 - 34.0 pg   MCHC 32.7 30.0 - 36.0 g/dL   RDW 13.2 11.5 - 15.5 %   Platelets 276 150 - 400 K/uL   nRBC 0.0 0.0 - 0.2 %   Neutrophils Relative % 76 %   Neutro Abs 6.3 1.7 - 7.7 K/uL   Lymphocytes Relative 15 %   Lymphs Abs 1.3 0.7 - 4.0 K/uL   Monocytes Relative 9 %   Monocytes Absolute 0.8 0.1 - 1.0 K/uL   Eosinophils Relative 0 %   Eosinophils Absolute 0.0 0.0 - 0.5 K/uL   Basophils Relative 0 %   Basophils Absolute 0.0 0.0 - 0.1 K/uL   Immature Granulocytes 0 %   Abs Immature Granulocytes 0.03 0.00 - 0.07 K/uL    Comment: Performed at [facility], [place] [address], [place], [place] [address]  APTT     Status: None   Collection Time: 07/07/19  4:50 PM  Result Value Ref Range   aPTT 27 24 - 36 seconds    Comment: Performed at [facility], [place] [address], [place], [place] [address]  Protime-INR     Status: None   Collection Time: 07/07/19  4:50 PM  Result Value Ref Range   Prothrombin Time 13.0 11.4 - 15.2 seconds   INR 1.0 0.8 - 1.2    Comment: (NOTE) INR goal varies based on device and disease states. Performed at [facility], [place] [address] [place],  [address]   Respiratory Panel by RT PCR (Flu A&B, Covid) - Nasopharyngeal Swab     Status: None   Collection Time: 07/07/19  5:14 PM   Specimen: Nasopharyngeal Swab  Result Value Ref Range   SARS Coronavirus 2 by RT PCR NEGATIVE NEGATIVE    Comment: (NOTE) SARS-CoV-2 target nucleic acids are NOT DETECTED. The SARS-CoV-2 RNA is generally detectable in upper respiratoy specimens during the acute phase of infection. The lowest concentration of SARS-CoV-2 viral copies this assay can detect is 131 copies/mL. A negative result does not preclude SARS-Cov-2 infection and should not be used as the sole basis for treatment  or other patient management decisions. A negative result may occur with  improper specimen collection/handling, submission of specimen other than nasopharyngeal swab, presence of viral mutation(s) within the  areas targeted by this assay, and inadequate number of viral copies (<131 copies/mL). A negative result must be combined with clinical observations, patient history, and epidemiological information. The expected result is Negative. Fact Sheet for Patients:  PinkCheek.be Fact Sheet for Healthcare Providers:  GravelBags.it This test is not yet ap proved or cleared by the [place] FDA and  has been authorized for detection and/or diagnosis of SARS-CoV-2 by FDA under an Emergency Use Authorization (EUA). This EUA will remain  in effect (meaning this test can be used) for the duration of the COVID-19 declaration under Section 564(b)(1) of the Act, 21 U.S.C. section 360bbb-3(b)(1), unless the authorization is terminated or revoked sooner.    Influenza A by PCR NEGATIVE NEGATIVE   Influenza B by PCR NEGATIVE NEGATIVE    Comment: (NOTE) The Xpert Xpress SARS-CoV-2/FLU/RSV assay is intended as an aid in  the diagnosis of influenza from Nasopharyngeal swab specimens and  should not be used as a sole basis for treatment. Nasal washings and  aspirates are unacceptable for Xpert Xpress SARS-CoV-2/FLU/RSV  testing. Fact Sheet for Patients: PinkCheek.be Fact Sheet for Healthcare Providers: GravelBags.it This test is not yet approved or cleared by the [place] FDA and  has been authorized for detection and/or diagnosis of SARS-CoV-2 by  FDA under an Emergency Use Authorization (EUA). This EUA will remain  in effect (meaning this test can be used) for the duration of the  Covid-19 declaration under Section 564(b)(1) of the Act, 21  U.S.C. section 360bbb-3(b)(1), unless  the authorization is  terminated or revoked. Performed at [facility], [place] [address] [place], [place] [address]   Urinalysis, Routine w reflex microscopic     Status: Abnormal   Collection Time: 07/07/19  5:29 PM  Result Value Ref Range   Color, Urine YELLOW YELLOW   APPearance HAZY (A) CLEAR   Specific Gravity, Urine 1.016 1.005 - 1.030   pH 5.0 5.0 - 8.0   Glucose, UA NEGATIVE NEGATIVE mg/dL   Hgb urine dipstick NEGATIVE NEGATIVE   Bilirubin Urine NEGATIVE NEGATIVE   Ketones, ur NEGATIVE NEGATIVE mg/dL   Protein, ur 30 (A) NEGATIVE mg/dL   Nitrite NEGATIVE NEGATIVE   Leukocytes,Ua NEGATIVE NEGATIVE   RBC / HPF 0-5 0 - 5 RBC/hpf   WBC, UA 0-5 0 - 5 WBC/hpf   Bacteria, UA NONE SEEN NONE SEEN   Squamous Epithelial / LPF 0-5 0 - 5   Mucus PRESENT    Hyaline Casts, UA PRESENT     Comment: Performed at [facility], [place] [address] [place], [place] [address]  Urine culture     Status: Abnormal   Collection Time: 07/07/19  5:29 PM   Specimen: In/Out Cath Urine  Result Value Ref Range   Specimen Description      IN/OUT CATH URINE Performed at [facility], [place] [address], [place], [place] [address]    Special Requests      NONE Performed at [facility], [place] [address] [place], [place] [address]    Culture MULTIPLE SPECIES PRESENT, SUGGEST RECOLLECTION (A)    Report Status 07/08/2019 FINAL   Lactic acid, plasma     Status: None   Collection Time: 07/07/19  6:50 PM  Result Value Ref Range   Lactic Acid, Venous 1.7 0.5 - 1.9 mmol/L    Comment: Performed at [facility], [place] [address] [place],  [address]  Magnesium     Status: Abnormal  Collection Time: 07/08/19  4:12 AM  Result Value Ref Range   Magnesium 1.6 (L) 1.7 - 2.4 mg/dL    Comment: Performed at [facility], [place] [address], [place], [place] [address]  Phosphorus     Status: None    Collection Time: 07/08/19  4:12 AM  Result Value Ref Range   Phosphorus 3.2 2.5 - 4.6 mg/dL    Comment: Performed at [facility], [place] [address], [place], [place] [address]  HIV Antibody (routine testing w rflx)     Status: None   Collection Time: 07/08/19  4:12 AM  Result Value Ref Range   HIV Screen 4th Generation wRfx Non Reactive Non Reactive    Comment: Performed at [facility], [place] [address], [place], [place] [address]  CBC with Differential/Platelet     Status: Abnormal   Collection Time: 07/08/19  9:24 AM  Result Value Ref Range   WBC 6.2 4.0 - 10.5 K/uL   RBC 3.81 (L) 4.22 - 5.81 MIL/uL   Hemoglobin 11.0 (L) 13.0 - 17.0 g/dL   HCT 32.8 (L) 39.0 - 52.0 %   MCV 86.1 80.0 - 100.0 fL   MCH 28.9 26.0 - 34.0 pg   MCHC 33.5 30.0 - 36.0 g/dL   RDW 13.0 11.5 - 15.5 %   Platelets 273 150 - 400 K/uL   nRBC 0.0 0.0 - 0.2 %   Neutrophils Relative % 69 %   Neutro Abs 4.3 1.7 - 7.7 K/uL   Lymphocytes Relative 22 %   Lymphs Abs 1.3 0.7 - 4.0 K/uL   Monocytes Relative 9 %   Monocytes Absolute 0.6 0.1 - 1.0 K/uL   Eosinophils Relative 0 %   Eosinophils Absolute 0.0 0.0 - 0.5 K/uL   Basophils Relative 0 %   Basophils Absolute 0.0 0.0 - 0.1 K/uL   Immature Granulocytes 0 %   Abs Immature Granulocytes 0.02 0.00 - 0.07 K/uL    Comment: Performed at [facility], [place] [address], [place], [place] [address]  Comprehensive metabolic panel     Status: Abnormal   Collection Time: 07/08/19  9:24 AM  Result Value Ref Range   Sodium 144 135 - 145 mmol/L   Potassium 3.4 (L) 3.5 - 5.1 mmol/L    Comment: DELTA CHECK NOTED   Chloride 107 98 - 111 mmol/L   CO2 24 22 - 32 mmol/L   Glucose, Bld 64 (L) 70 - 99 mg/dL    Comment: Glucose reference range applies only to samples taken after fasting for at least 8 hours.   BUN 19 8 - 23 mg/dL   Creatinine, Ser 0.60 (L) 0.61 - 1.24 mg/dL   Calcium 8.8 (L) 8.9 - 10.3 mg/dL   Total Protein 6.7 6.5 - 8.1 g/dL    Albumin 2.9 (L) 3.5 - 5.0 g/dL   AST 17 15 - 41 U/L   ALT 10 0 - 44 U/L   Alkaline Phosphatase 65 38 - 126 U/L   Total Bilirubin 1.1 0.3 - 1.2 mg/dL   GFR calc non Af Amer >60 >60 mL/min   GFR calc Af Amer >60 >60 mL/min   Anion gap 13 5 - 15    Comment: Performed at [facility], [place] [address], [place], [place] [address]  Magnesium     Status: Abnormal   Collection Time: 07/08/19  9:24 AM  Result Value Ref Range   Magnesium 1.6 (L) 1.7 - 2.4 mg/dL  Comment: Performed at [facility], [place] [address], [place], [place] [address]  Phosphorus     Status: None   Collection Time: 07/08/19  9:24 AM  Result Value Ref Range   Phosphorus 3.1 2.5 - 4.6 mg/dL    Comment: Performed at [facility], [place] [address] [place], [place] [address]  Urine rapid drug screen (hosp performed)     Status: Abnormal   Collection Time: 07/08/19  1:02 PM  Result Value Ref Range   Opiates NONE DETECTED NONE DETECTED   Cocaine POSITIVE (A) NONE DETECTED   Benzodiazepines NONE DETECTED NONE DETECTED   Amphetamines NONE DETECTED NONE DETECTED   Tetrahydrocannabinol NONE DETECTED NONE DETECTED   Barbiturates NONE DETECTED NONE DETECTED    Comment: (NOTE) DRUG SCREEN FOR MEDICAL PURPOSES ONLY.  IF CONFIRMATION IS NEEDED FOR ANY PURPOSE, NOTIFY LAB WITHIN 5 DAYS. LOWEST DETECTABLE LIMITS FOR URINE DRUG SCREEN Drug Class                     Cutoff (ng/mL) Amphetamine and metabolites    1000 Barbiturate and metabolites    200 Benzodiazepine                 660 Tricyclics and metabolites     300 Opiates and metabolites        300 Cocaine and metabolites        300 THC                            50 Performed at [facility], [place] [address], [place], [place] [address]     CT Soft Tissue Neck W Contrast  Result Date: 07/08/2019 CLINICAL DATA:  Neck mass EXAM: CT NECK WITH CONTRAST TECHNIQUE: Multidetector CT imaging of the neck  was performed using the standard protocol following the bolus administration of intravenous contrast. CONTRAST:  57mL OMNIPAQUE IOHEXOL 300 MG/ML  SOLN COMPARISON:  None. FINDINGS: Evaluation is limited by poor soft tissue resolution in the setting significant cachexia and probable diffuse edema. Additionally, motion artifact is present. Pharynx and larynx: There is enhancing soft tissue in the post cricoid region extending superiorly with effacement of the piriform sinuses and thickening of the aryepiglottic folds. Salivary glands: Unremarkable. Thyroid: Unremarkable. Lymph nodes: No definite enlarged lymph nodes. Vascular: Major neck vessels are patent. There is calcified plaque at the right greater than left ICA origins. There may be significant stenosis on the right. Limited intracranial: No abnormal enhancement. Visualized orbits: Unremarkable. Mastoids and visualized paranasal sinuses: Bilateral maxillary sinus air-fluid levels. Visualized mastoid air cells are clear. Skeleton: Multilevel degenerative changes of the cervical spine. Upper chest: Emphysema. New extension of centrilobular/tree-in-bud opacities within the left upper lobe. Probable small left pleural effusion Other: None. IMPRESSION: Suboptimal evaluation due to motion artifact and poor soft tissue resolution in the setting of significant cachexia and probable diffuse edema. Enhancing hypopharyngeal soft tissue in the post cricoid region extending superiorly with effacement of the piriform sinuses and thickening of the aryepiglottic folds. Malignancy is suspected and direct visual inspection is recommended. Extension of pneumonia into the left upper lobe. Probable small left pleural effusion. Maxillary sinus air-fluid levels, a nonspecific finding that can reflect acute sinusitis in the appropriate setting. Electronically Signed   By: Macy Mis M.D.   On: 07/08/2019 13:45   CT Angio Chest PE W and/or Wo Contrast  Result Date:  07/07/2019 CLINICAL DATA:  Failure  to thrive, history of pneumonia, cough EXAM: CT ANGIOGRAPHY CHEST WITH CONTRAST TECHNIQUE: Multidetector CT imaging of the chest was performed using the standard protocol during bolus administration of intravenous contrast. Multiplanar CT image reconstructions and MIPs were obtained to evaluate the vascular anatomy. CONTRAST:  24mL OMNIPAQUE IOHEXOL 350 MG/ML SOLN COMPARISON:  07/07/2019 FINDINGS: Cardiovascular: This is a technically adequate evaluation of the pulmonary vasculature. No filling defects or pulmonary emboli. The heart is unremarkable without pericardial effusion. Mild calcification of the mitral and aortic valves. Mild atherosclerosis of the coronary vasculature. Mediastinum/Nodes: No enlarged mediastinal, hilar, or axillary lymph nodes. Thyroid gland, trachea, and esophagus demonstrate no significant findings. Because of marked thoracic kyphosis and positioning, a significant portion of the hypopharynx is included on this exam. There is asymmetric soft tissue in the supraglottic region, incompletely evaluated on this study. I am suspicious of a mass in the right hypopharynx, reference image 15. Follow-up CT neck with contrast or direct visual inspection is recommended. Lungs/Pleura: There is severe background emphysema. Bilateral lower lobe consolidation is seen, left greater than right. Given the above findings in the hypopharynx, aspiration should be considered in addition to community acquired pneumonia. Within the densely consolidated left lower lobe there is evidence of an enhancing 2.7 cm mass, which could reflect metastatic disease. This is best seen on image 126 of series 4. No effusion or pneumothorax. Upper Abdomen: Patient is markedly cachectic. No gross abnormalities are visualized. Musculoskeletal: No acute or destructive bony lesions. Reconstructed images demonstrate no additional findings. Review of the MIP images confirms the above findings.  IMPRESSION: 1. No evidence of pulmonary embolus. 2. Abnormal soft tissue in the hypopharynx, suspicious for underlying mass. CT neck or direct visual inspection recommended for further evaluation. 3. Dense bilateral lower lobe consolidation, left greater than right. Aspiration is suspected given findings in the hypopharynx. 4. There is evidence of an enhancing mass within the densely consolidated left lower lobe, metastatic disease or primary neoplasm not excluded. 5. Marked cachexia. Electronically Signed   By: Randa Ngo M.D.   On: 07/07/2019 19:12   CT ABDOMEN PELVIS W CONTRAST  Result Date: 07/08/2019 CLINICAL DATA:  Lymphoma staging. EXAM: CT ABDOMEN AND PELVIS WITH CONTRAST TECHNIQUE: Multidetector CT imaging of the abdomen and pelvis was performed using the standard protocol following bolus administration of intravenous contrast. CONTRAST:  50mL OMNIPAQUE IOHEXOL 300 MG/ML  SOLN COMPARISON:  CT chest 07/07/2019 FINDINGS: Lower chest: Lung bases demonstrate interval worsening bibasilar consolidation left much worse than right. Rim enhancing low-density structure within the dense consolidation in the left base without significant change which could represent underlying malignancy versus necrotic infection or pulmonary abscess. Calcification of the mitral valve annulus. Hepatobiliary: Liver, gallbladder and biliary tree are normal. Pancreas: Normal. Spleen: Normal. Adrenals/Urinary Tract: Adrenal glands are unremarkable. Kidneys are normal in size and demonstrate bilateral streaky cortical low-attenuation which can be seen in pyelonephritis. No evidence of hydronephrosis. No definite Peri nephric inflammation or fluid. Ureters are not well visualized. Contrast is present within the bladder from patient's recent chest CT. Stomach/Bowel: Stomach and small bowel are unremarkable. Appendix is not well visualized. Colon is unremarkable. Moderate fecal retention over the rectum. Vascular/Lymphatic: Mild  calcified plaque over the abdominal aorta which is normal caliber. No definite adenopathy. Reproductive: Normal. Other: Evidence of cachexia with significant decrease mesenteric fat and subcutaneous fat present. Musculoskeletal: Degenerative change of the spine and hips. IMPRESSION: 1. Interval worsening of bibasilar airspace consolidation left base worse than right likely due to pneumonia. Stable  rim enhancing low-density focus over the left base consolidation which may be due to necrosis, pulmonary abscess or underlying malignancy. 2.  No evidence of metastatic disease within the abdomen. 3. Bilateral streaky cortical low-attenuation over the kidneys which can be seen in pyelonephritis. Recommend clinical correlation. 4.  Aortic Atherosclerosis (ICD10-I70.0). 5. Findings compatible patient's clinical cachexia with significant decreased subcutaneous and mesenteric fat. Electronically Signed   By: Marin Olp M.D.   On: 07/08/2019 13:03   DG Chest Port 1 View  Result Date: 07/07/2019 CLINICAL DATA:  Cough. EXAM: PORTABLE CHEST 1 VIEW COMPARISON:  04/18/2019 FINDINGS: There is a new left lower lobe airspace opacity concerning for pneumonia. The heart size is stable. Aortic calcifications are noted. The lungs are hyperexpanded. There is no pneumothorax. There is no acute osseous abnormality. IMPRESSION: 1. New left lower lobe airspace opacity concerning for pneumonia. Follow-up to radiologic resolution is recommended. 2. COPD. Electronically Signed   By: Constance Holster M.D.   On: 07/07/2019 17:18   DG Swallowing Func-Speech Pathology  Result Date: 07/08/2019 Objective Swallowing Evaluation: Type of Study: MBS-Modified Barium Swallow Study  Patient Details Name: Shail Urbas MRN: [id number] Date of Birth: Sep 18, 1955 Today's Date: 07/08/2019 Time: SLP Start Time (ACUTE ONLY): 4431 -SLP Stop Time (ACUTE ONLY): 1320 SLP Time Calculation (min) (ACUTE ONLY): 25 min Past Medical History: Past Medical History:  Diagnosis Date . Medical history non-contributory  Past Surgical History: Past Surgical History: Procedure Laterality Date . NO PAST SURGERIES   HPI: 64 yo with progressive dysphagia, appears cachetic, found to have likely aspiration pna per CT - enhancing mass LLL- suspected aspiration.  . Imaging studies also concerning for soft tissue mass in hypopharynx. Pt has been a consumer of ETOH - beer and smokes cigarettes.   CT neck today showed "Enhancing hypopharyngeal soft tissue in the post cricoid region extending superiorly with effacement of the piriform sinuses andthickening of the aryepiglottic folds."  Pt desired to eat/drink and swallow evaluation had been ordered. SLP advised an MBS to view impact of mass on pharyngeal swallow.  Subjective: pt awake in chair Assessment / Plan / Recommendation CHL IP CLINICAL IMPRESSIONS 07/08/2019 Clinical Impression Pt presents with gross obstructive based dysphagia secondary to his "Enhancing hypopharyngeal soft tissue in the post cricoid region extending superiorly with effacement of the piriform sinuses and thickening of the aryepiglottic folds"per CT imaging.  Gross pharyngeal-cervical esophageal retention mixed with secretions was aspirated post=swallow as residuals spilled into an open airway. Pt consumption of small cup bolus resulted in improved muscular contraction and thus clearance into esophagus.  Recommend pt be npo except single ice chips after oral care.  Pt was informed to clinical reasoning for recommendations but immediately asked if he could eat - clearly demonstrating compromised understanding. SLP Visit Diagnosis Dysphagia, oropharyngeal phase (R13.12);Dysphagia, pharyngoesophageal phase (R13.14) Attention and concentration deficit following -- Frontal lobe and executive function deficit following -- Impact on safety and function Severe aspiration risk;Risk for inadequate nutrition/hydration   CHL IP TREATMENT RECOMMENDATION 07/08/2019 Treatment  Recommendations Therapy as outlined in treatment plan below   Prognosis 07/08/2019 Prognosis for Safe Diet Advancement Guarded Barriers to Reach Goals -- Barriers/Prognosis Comment severity of dysphagia due to pt's mass CHL IP DIET RECOMMENDATION 07/08/2019 SLP Diet Recommendations NPO;Ice chips PRN after oral care Liquid Administration via -- Medication Administration Via alternative means Compensations (No Data) Postural Changes --   CHL IP OTHER RECOMMENDATIONS 07/08/2019 Recommended Consults -- Oral Care Recommendations Oral care QID Other Recommendations --   CHL  IP FOLLOW UP RECOMMENDATIONS 07/08/2019 Follow up Recommendations (No Data)   CHL IP FREQUENCY AND DURATION 07/08/2019 Speech Therapy Frequency (ACUTE ONLY) min 2x/week Treatment Duration 2 weeks      CHL IP ORAL PHASE 07/08/2019 Oral Phase Impaired Oral - Pudding Teaspoon -- Oral - Pudding Cup -- Oral - Honey Teaspoon -- Oral - Honey Cup -- Oral - Nectar Teaspoon Reduced posterior propulsion;Weak lingual manipulation;Delayed oral transit;Other (Comment) Oral - Nectar Cup Reduced posterior propulsion;Weak lingual manipulation;Delayed oral transit;Other (Comment);Lingual/palatal residue Oral - Nectar Straw -- Oral - Thin Teaspoon Weak lingual manipulation;Delayed oral transit;Other (Comment);Premature spillage Oral - Thin Cup Delayed oral transit;Weak lingual manipulation;Reduced posterior propulsion;Other (Comment);Lingual/palatal residue Oral - Thin Straw -- Oral - Puree -- Oral - Mech Soft -- Oral - Regular -- Oral - Multi-Consistency -- Oral - Pill -- Oral Phase - Comment lingual rocking observed across all boluses  [facility] IP PHARYNGEAL PHASE 07/08/2019 Pharyngeal Phase Impaired Pharyngeal- Pudding Teaspoon -- Pharyngeal -- Pharyngeal- Pudding Cup -- Pharyngeal -- Pharyngeal- Honey Teaspoon -- Pharyngeal -- Pharyngeal- Honey Cup -- Pharyngeal -- Pharyngeal- Nectar Teaspoon Pharyngeal residue - pyriform;Pharyngeal residue - posterior pharnyx;Pharyngeal residue - cp  segment;Penetration/Apiration after swallow;Moderate aspiration;Lateral channel residue;Inter-arytenoid space residue;Reduced airway/laryngeal closure;Reduced epiglottic inversion;Reduced anterior laryngeal mobility;Reduced laryngeal elevation Pharyngeal Material enters airway, passes BELOW cords and not ejected out despite cough attempt by patient;Material enters airway, passes BELOW cords without attempt by patient to eject out (silent aspiration);Material enters airway, passes BELOW cords then ejected out Pharyngeal- Nectar Cup Penetration/Apiration after swallow;Pharyngeal residue - pyriform;Pharyngeal residue - posterior pharnyx;Pharyngeal residue - cp segment;Inter-arytenoid space residue;Lateral channel residue;Reduced airway/laryngeal closure;Reduced epiglottic inversion;Reduced laryngeal elevation;Reduced anterior laryngeal mobility Pharyngeal Material enters airway, passes BELOW cords without attempt by patient to eject out (silent aspiration) Pharyngeal- Nectar Straw -- Pharyngeal -- Pharyngeal- Thin Teaspoon Penetration/Apiration after swallow;Pharyngeal residue - pyriform;Pharyngeal residue - posterior pharnyx;Pharyngeal residue - cp segment;Inter-arytenoid space residue;Lateral channel residue;Reduced tongue base retraction;Reduced epiglottic inversion;Penetration/Aspiration before swallow;Penetration/Aspiration during swallow;Moderate aspiration;Reduced airway/laryngeal closure;Reduced laryngeal elevation;Reduced anterior laryngeal mobility Pharyngeal Material enters airway, passes BELOW cords without attempt by patient to eject out (silent aspiration);Material enters airway, passes BELOW cords and not ejected out despite cough attempt by patient Pharyngeal- Thin Cup Pharyngeal residue - pyriform;Pharyngeal residue - posterior pharnyx;Pharyngeal residue - valleculae;Pharyngeal residue - cp segment;Inter-arytenoid space residue;Lateral channel residue;Reduced airway/laryngeal closure;Reduced epiglottic  inversion;Reduced laryngeal elevation;Reduced anterior laryngeal mobility Pharyngeal Material enters airway, passes BELOW cords without attempt by patient to eject out (silent aspiration);Material enters airway, passes BELOW cords and not ejected out despite cough attempt by patient Pharyngeal- Thin Straw -- Pharyngeal -- Pharyngeal- Puree -- Pharyngeal -- Pharyngeal- Mechanical Soft -- Pharyngeal -- Pharyngeal- Regular -- Pharyngeal -- Pharyngeal- Multi-consistency -- Pharyngeal -- Pharyngeal- Pill -- Pharyngeal -- Pharyngeal Comment --  CHL IP CERVICAL ESOPHAGEAL PHASE 07/08/2019 Cervical Esophageal Phase Impaired Pudding Teaspoon -- Pudding Cup -- Honey Teaspoon -- Honey Cup -- Nectar Teaspoon -- Nectar Cup -- Nectar Straw -- Thin Teaspoon -- Thin Cup -- Thin Straw -- Puree -- Mechanical Soft -- Regular -- Multi-consistency -- Pill -- Cervical Esophageal Comment Pt with minimal clearance of barium into esophagus due to his obstructive mass which results in gross retention of barium mixed with secretions with aspiration post=swallow. Kathleen Lime, MS [facility] SLP Acute Rehab Services Office [phone] [name] 07/08/2019, 2:47 PM               ROS:ROS  Blood pressure 135/86, pulse 83, temperature (!) 97.2 F (36.2 C), temperature source Oral, resp.  rate 20, height 5\' 11"  (1.803 m), weight 42 kg, SpO2 99 %.  PHYSICAL EXAM: General appearance -patient in no distress Mental status - alert, oriented to person, place, and time Nose - normal and patent, no erythema, discharge or polyps Mouth - mucous membranes moist, pharynx normal without lesions and Edentulous, no mucosal mass or lesion.  Normal tongue mobility. Neck - supple, no significant adenopathy  Procedure Note: Flexible Laryngoscopy  Risks/benefits and possible complications were discussed in detail. Patient understands and agrees to proceed with procedure.   Procedure: 4 mm flexible laryngoscope inserted through the nasal passageway with  minimal discomfort. Nasal cavity and nasopharynx patent without discharge, mass or polyp. 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  Airway intact with some partial obstruction.  Pooling of secretions in the hypopharynx.   Patient tolerated procedure without complication or difficulty.  Early Chars. Wilburn Cornelia, M.D.    Studies Reviewed: CT scan neck shows enhancing mass involving the posterior glottis and hypopharynx consistent with patient's clinical findings.  Assessment/Plan: Patient admitted to Hospital Service with progressive cachexia and dysphagia.  Incidental finding on CT imaging showed lesion in the hypopharynx.  Dedicated CT scan of the neck showed enhancing mass involving the hypopharynx and posterior glottis.  Patient airway stable but flexible laryngoscopy shows large exophytic tumor with partial obstruction.  Given history and findings I recommended tracheostomy, direct laryngoscopy with biopsy.  Risks and benefits were discussed with the patient and his family by telephone and they understand and agree.  Jerrell Belfast 07/08/2019, 6:07 PM

## 2019-07-09 ENCOUNTER — Inpatient Hospital Stay (HOSPITAL_COMMUNITY): Payer: Medicaid Other | Admitting: Certified Registered Nurse Anesthetist

## 2019-07-09 ENCOUNTER — Encounter (HOSPITAL_COMMUNITY)
Admission: EM | Disposition: A | Discharge status: Skilled Nursing Facility | Payer: Self-pay | Source: Home / Self Care | Attending: Internal Medicine

## 2019-07-09 ENCOUNTER — Ambulatory Visit
Admit: 2019-07-09 | Discharge: 2019-07-09 | Disposition: A | Discharge status: Home / Self Care | Payer: Medicaid Other | Attending: Radiation Oncology | Admitting: Radiation Oncology

## 2019-07-09 ENCOUNTER — Encounter (HOSPITAL_COMMUNITY): Payer: Self-pay | Admitting: Internal Medicine

## 2019-07-09 ENCOUNTER — Inpatient Hospital Stay (HOSPITAL_COMMUNITY): Payer: Medicaid Other

## 2019-07-09 DIAGNOSIS — R221 Localized swelling, mass and lump, neck: Secondary | ICD-10-CM

## 2019-07-09 DIAGNOSIS — R918 Other nonspecific abnormal finding of lung field: Secondary | ICD-10-CM

## 2019-07-09 DIAGNOSIS — D649 Anemia, unspecified: Secondary | ICD-10-CM

## 2019-07-09 DIAGNOSIS — Z930 Tracheostomy status: Secondary | ICD-10-CM

## 2019-07-09 DIAGNOSIS — C139 Malignant neoplasm of hypopharynx, unspecified: Secondary | ICD-10-CM

## 2019-07-09 DIAGNOSIS — J690 Pneumonitis due to inhalation of food and vomit: Secondary | ICD-10-CM | POA: Diagnosis not present

## 2019-07-09 DIAGNOSIS — R64 Cachexia: Secondary | ICD-10-CM

## 2019-07-09 DIAGNOSIS — I4891 Unspecified atrial fibrillation: Secondary | ICD-10-CM

## 2019-07-09 DIAGNOSIS — J181 Lobar pneumonia, unspecified organism: Secondary | ICD-10-CM

## 2019-07-09 DIAGNOSIS — J9601 Acute respiratory failure with hypoxia: Secondary | ICD-10-CM

## 2019-07-09 DIAGNOSIS — R579 Shock, unspecified: Secondary | ICD-10-CM | POA: Diagnosis not present

## 2019-07-09 DIAGNOSIS — E43 Unspecified severe protein-calorie malnutrition: Secondary | ICD-10-CM

## 2019-07-09 DIAGNOSIS — E162 Hypoglycemia, unspecified: Secondary | ICD-10-CM | POA: Diagnosis not present

## 2019-07-09 DIAGNOSIS — I959 Hypotension, unspecified: Secondary | ICD-10-CM

## 2019-07-09 HISTORY — PX: DIRECT LARYNGOSCOPY: SHX5326

## 2019-07-09 HISTORY — PX: TRACHEOSTOMY TUBE PLACEMENT: SHX814

## 2019-07-09 LAB — GLUCOSE, CAPILLARY
Glucose-Capillary: 30 mg/dL — CL (ref 70–99)
Glucose-Capillary: 75 mg/dL (ref 70–99)
Glucose-Capillary: 109 mg/dL — ABNORMAL HIGH (ref 70–99)
Glucose-Capillary: 106 mg/dL — ABNORMAL HIGH (ref 70–99)
Glucose-Capillary: 79 mg/dL (ref 70–99)
Glucose-Capillary: 108 mg/dL — ABNORMAL HIGH (ref 70–99)
Glucose-Capillary: 101 mg/dL — ABNORMAL HIGH (ref 70–99)
Glucose-Capillary: 89 mg/dL (ref 70–99)

## 2019-07-09 LAB — PHOSPHORUS: Phosphorus: 3.7 mg/dL (ref 2.5–4.6)

## 2019-07-09 LAB — IRON AND TIBC
Iron: 30 ug/dL — ABNORMAL LOW (ref 45–182)
Saturation Ratios: 19 % (ref 17.9–39.5)
TIBC: 160 ug/dL — ABNORMAL LOW (ref 250–450)
UIBC: 130 ug/dL

## 2019-07-09 LAB — RETICULOCYTES
Immature Retic Fract: 14 % (ref 2.3–15.9)
RBC.: 3.87 MIL/uL — ABNORMAL LOW (ref 4.22–5.81)
Retic Count, Absolute: 34.8 10*3/uL (ref 19.0–186.0)
Retic Ct Pct: 0.9 % (ref 0.4–3.1)

## 2019-07-09 LAB — COMPREHENSIVE METABOLIC PANEL
ALT: 11 U/L (ref 0–44)
AST: 18 U/L (ref 15–41)
Albumin: 2.9 g/dL — ABNORMAL LOW (ref 3.5–5.0)
Alkaline Phosphatase: 63 U/L (ref 38–126)
Anion gap: 17 — ABNORMAL HIGH (ref 5–15)
BUN: 18 mg/dL (ref 8–23)
CO2: 22 mmol/L (ref 22–32)
Calcium: 8.8 mg/dL — ABNORMAL LOW (ref 8.9–10.3)
Chloride: 106 mmol/L (ref 98–111)
Creatinine, Ser: 0.59 mg/dL — ABNORMAL LOW (ref 0.61–1.24)
GFR calc Af Amer: >60 mL/min (ref >60)
GFR calc non Af Amer: >60 mL/min (ref >60)
Glucose, Bld: 47 mg/dL — ABNORMAL LOW (ref 70–99)
Potassium: 3.8 mmol/L (ref 3.5–5.1)
Sodium: 145 mmol/L (ref 135–145)
Total Bilirubin: 1.1 mg/dL (ref 0.3–1.2)
Total Protein: 6.6 g/dL (ref 6.5–8.1)

## 2019-07-09 LAB — CBC WITH DIFFERENTIAL/PLATELET
Abs Immature Granulocytes: 0.03 10*3/uL (ref 0.00–0.07)
Basophils Absolute: 0 10*3/uL (ref 0.0–0.1)
Basophils Relative: 0 %
Eosinophils Absolute: 0 10*3/uL (ref 0.0–0.5)
Eosinophils Relative: 0 %
HCT: 34.8 % — ABNORMAL LOW (ref 39.0–52.0)
Hemoglobin: 11.1 g/dL — ABNORMAL LOW (ref 13.0–17.0)
Immature Granulocytes: 0 %
Lymphocytes Relative: 13 %
Lymphs Abs: 1.2 10*3/uL (ref 0.7–4.0)
MCH: 28.3 pg (ref 26.0–34.0)
MCHC: 31.9 g/dL (ref 30.0–36.0)
MCV: 88.8 fL (ref 80.0–100.0)
Monocytes Absolute: 0.6 10*3/uL (ref 0.1–1.0)
Monocytes Relative: 7 %
Neutro Abs: 6.9 10*3/uL (ref 1.7–7.7)
Neutrophils Relative %: 80 %
Platelets: 297 10*3/uL (ref 150–400)
RBC: 3.92 MIL/uL — ABNORMAL LOW (ref 4.22–5.81)
RDW: 13.2 % (ref 11.5–15.5)
WBC: 8.8 10*3/uL (ref 4.0–10.5)
nRBC: 0 % (ref 0.0–0.2)

## 2019-07-09 LAB — MAGNESIUM: Magnesium: 1.9 mg/dL (ref 1.7–2.4)

## 2019-07-09 LAB — FERRITIN: Ferritin: 421 ng/mL — ABNORMAL HIGH (ref 24–336)

## 2019-07-09 LAB — FOLATE: Folate: 11.5 ng/mL (ref >5.9)

## 2019-07-09 LAB — VITAMIN B12: Vitamin B-12: 281 pg/mL (ref 180–914)

## 2019-07-09 LAB — CORTISOL: Cortisol, Plasma: 16.8 ug/dL

## 2019-07-09 LAB — MRSA PCR SCREENING: MRSA by PCR: NEGATIVE

## 2019-07-09 SURGERY — LARYNGOSCOPY, DIRECT
Anesthesia: General | Site: Neck

## 2019-07-09 MED ORDER — FENTANYL CITRATE (PF) 100 MCG/2ML IJ SOLN
INTRAMUSCULAR | Status: DC | PRN
  Administered 2019-07-09 (×2): 50 ug via INTRAVENOUS

## 2019-07-09 MED ORDER — DEXAMETHASONE SODIUM PHOSPHATE 10 MG/ML IJ SOLN
INTRAMUSCULAR | Status: DC | PRN
  Administered 2019-07-09: 4 mg via INTRAVENOUS

## 2019-07-09 MED ORDER — PROPOFOL 10 MG/ML IV BOLUS
INTRAVENOUS | Status: DC | PRN
  Administered 2019-07-09 (×5): 20 mg via INTRAVENOUS

## 2019-07-09 MED ORDER — MIDAZOLAM HCL 5 MG/5ML IJ SOLN
INTRAMUSCULAR | Status: DC | PRN
  Administered 2019-07-09: 1 mg via INTRAVENOUS

## 2019-07-09 MED ORDER — ONDANSETRON HCL 4 MG/2ML IJ SOLN
INTRAMUSCULAR | Status: DC | PRN
  Administered 2019-07-09: 4 mg via INTRAVENOUS

## 2019-07-09 MED ORDER — FENTANYL CITRATE (PF) 100 MCG/2ML IJ SOLN: 25.0000 ug | INTRAMUSCULAR | Status: DC | PRN

## 2019-07-09 MED ORDER — CHLORHEXIDINE GLUCONATE 0.12 % MT SOLN
15.0000 mL | Freq: Two times a day (BID) | OROMUCOSAL | Status: DC
  Administered 2019-07-10 – 2019-07-27 (×29): 15 mL via OROMUCOSAL
  Filled 2019-07-09 (×25): qty 15

## 2019-07-09 MED ORDER — OXYCODONE HCL 5 MG/5ML PO SOLN: 5.0000 mg | Freq: Once | ORAL | Status: DC | PRN

## 2019-07-09 MED ORDER — FENTANYL CITRATE (PF) 100 MCG/2ML IJ SOLN
INTRAMUSCULAR | Status: AC
  Filled 2019-07-09: qty 2

## 2019-07-09 MED ORDER — ONDANSETRON HCL 4 MG/2ML IJ SOLN: 4.0000 mg | Freq: Once | INTRAMUSCULAR | Status: DC | PRN

## 2019-07-09 MED ORDER — SUCCINYLCHOLINE CHLORIDE 200 MG/10ML IV SOSY
PREFILLED_SYRINGE | INTRAVENOUS | Status: AC
  Filled 2019-07-09: qty 10

## 2019-07-09 MED ORDER — KETAMINE HCL 10 MG/ML IJ SOLN
INTRAMUSCULAR | Status: AC
  Filled 2019-07-09: qty 1

## 2019-07-09 MED ORDER — DEXMEDETOMIDINE HCL 200 MCG/2ML IV SOLN
INTRAVENOUS | Status: DC | PRN
  Administered 2019-07-09 (×2): 4 ug via INTRAVENOUS

## 2019-07-09 MED ORDER — DEXTROSE 50 % IV SOLN
25.0000 g | INTRAVENOUS | Status: AC
  Administered 2019-07-09: 25 g via INTRAVENOUS

## 2019-07-09 MED ORDER — METOPROLOL TARTRATE 5 MG/5ML IV SOLN
5.0000 mg | Freq: Once | INTRAVENOUS | Status: AC
  Administered 2019-07-09: 5 mg via INTRAVENOUS

## 2019-07-09 MED ORDER — LIDOCAINE 2% (20 MG/ML) 5 ML SYRINGE
INTRAMUSCULAR | Status: AC
  Filled 2019-07-09: qty 5

## 2019-07-09 MED ORDER — VASOPRESSIN 20 UNIT/ML IV SOLN
INTRAVENOUS | Status: AC
  Filled 2019-07-09: qty 1

## 2019-07-09 MED ORDER — EPINEPHRINE PF 1 MG/ML IJ SOLN
INTRAMUSCULAR | Status: AC
  Filled 2019-07-09: qty 1

## 2019-07-09 MED ORDER — MIDAZOLAM HCL 2 MG/2ML IJ SOLN
INTRAMUSCULAR | Status: AC
  Filled 2019-07-09: qty 2

## 2019-07-09 MED ORDER — LORAZEPAM 1 MG PO TABS: 1.0000 mg | ORAL_TABLET | ORAL | Status: AC | PRN

## 2019-07-09 MED ORDER — PHENYLEPHRINE 40 MCG/ML (10ML) SYRINGE FOR IV PUSH (FOR BLOOD PRESSURE SUPPORT)
PREFILLED_SYRINGE | INTRAVENOUS | Status: AC
  Filled 2019-07-09: qty 10

## 2019-07-09 MED ORDER — PHENYLEPHRINE HCL-NACL 20-0.9 MG/250ML-% IV SOLN
INTRAVENOUS | Status: DC | PRN
  Administered 2019-07-09: 40 ug/min via INTRAVENOUS

## 2019-07-09 MED ORDER — GLYCOPYRROLATE 0.2 MG/ML IJ SOLN
INTRAMUSCULAR | Status: DC | PRN
  Administered 2019-07-09 (×2): .1 mg via INTRAVENOUS

## 2019-07-09 MED ORDER — LIDOCAINE-EPINEPHRINE (PF) 1 %-1:200000 IJ SOLN
INTRAMUSCULAR | Status: AC
  Filled 2019-07-09: qty 30

## 2019-07-09 MED ORDER — LACTATED RINGERS IV SOLN: INTRAVENOUS | Status: DC

## 2019-07-09 MED ORDER — ORAL CARE MOUTH RINSE
15.0000 mL | Freq: Two times a day (BID) | OROMUCOSAL | Status: DC
  Administered 2019-07-09 – 2019-07-18 (×10): 15 mL via OROMUCOSAL

## 2019-07-09 MED ORDER — PROPOFOL 10 MG/ML IV BOLUS
INTRAVENOUS | Status: AC
  Filled 2019-07-09: qty 40

## 2019-07-09 MED ORDER — PHENYLEPHRINE HCL (PRESSORS) 10 MG/ML IV SOLN
INTRAVENOUS | Status: DC | PRN
  Administered 2019-07-09 (×2): 120 ug via INTRAVENOUS
  Administered 2019-07-09: 80 ug via INTRAVENOUS
  Administered 2019-07-09 (×2): 120 ug via INTRAVENOUS
  Administered 2019-07-09 (×2): 80 ug via INTRAVENOUS

## 2019-07-09 MED ORDER — ROCURONIUM BROMIDE 10 MG/ML (PF) SYRINGE
PREFILLED_SYRINGE | INTRAVENOUS | Status: AC
  Filled 2019-07-09: qty 20

## 2019-07-09 MED ORDER — VASOPRESSIN 20 UNIT/ML IV SOLN
INTRAVENOUS | Status: DC | PRN
  Administered 2019-07-09 (×2): 1 [IU] via INTRAVENOUS

## 2019-07-09 MED ORDER — ONDANSETRON HCL 4 MG/2ML IJ SOLN
INTRAMUSCULAR | Status: AC
  Filled 2019-07-09: qty 2

## 2019-07-09 MED ORDER — CHLORHEXIDINE GLUCONATE CLOTH 2 % EX PADS
6.0000 | MEDICATED_PAD | Freq: Every day | CUTANEOUS | Status: DC
  Administered 2019-07-09 – 2019-08-28 (×46): 6 via TOPICAL

## 2019-07-09 MED ORDER — DEXTROSE 50 % IV SOLN
INTRAVENOUS | Status: AC
  Filled 2019-07-09: qty 50

## 2019-07-09 MED ORDER — ORAL CARE MOUTH RINSE
15.0000 mL | Freq: Two times a day (BID) | OROMUCOSAL | Status: DC
  Administered 2019-07-10 – 2019-07-26 (×27): 15 mL via OROMUCOSAL

## 2019-07-09 MED ORDER — EPHEDRINE 5 MG/ML INJ
INTRAVENOUS | Status: AC
  Filled 2019-07-09: qty 10

## 2019-07-09 MED ORDER — DEXTROSE 50 % IV SOLN: 12.5000 g | INTRAVENOUS | Status: AC

## 2019-07-09 MED ORDER — METOPROLOL TARTRATE 5 MG/5ML IV SOLN
INTRAVENOUS | Status: AC
  Filled 2019-07-09: qty 5

## 2019-07-09 MED ORDER — LORAZEPAM 2 MG/ML IJ SOLN: 1.0000 mg | INTRAMUSCULAR | Status: AC | PRN

## 2019-07-09 MED ORDER — MEPERIDINE HCL 50 MG/ML IJ SOLN: 6.2500 mg | INTRAMUSCULAR | Status: DC | PRN

## 2019-07-09 MED ORDER — LIDOCAINE HCL (PF) 4 % IJ SOLN
5.0000 mL | Freq: Once | INTRAMUSCULAR | Status: DC
  Filled 2019-07-09: qty 5

## 2019-07-09 MED ORDER — LABETALOL HCL 5 MG/ML IV SOLN
10.0000 mg | Freq: Once | INTRAVENOUS | Status: AC
  Administered 2019-07-09: 10 mg via INTRAVENOUS
  Filled 2019-07-09: qty 4

## 2019-07-09 MED ORDER — ACETAMINOPHEN 160 MG/5ML PO SOLN: 325.0000 mg | ORAL | Status: DC | PRN

## 2019-07-09 MED ORDER — LIDOCAINE-EPINEPHRINE 1 %-1:100000 IJ SOLN
INTRAMUSCULAR | Status: DC | PRN
  Administered 2019-07-09: 3 mL

## 2019-07-09 MED ORDER — ROCURONIUM BROMIDE 100 MG/10ML IV SOLN
INTRAVENOUS | Status: DC | PRN
  Administered 2019-07-09: 20 mg via INTRAVENOUS
  Administered 2019-07-09: 30 mg via INTRAVENOUS

## 2019-07-09 MED ORDER — DEXTROSE IN LACTATED RINGERS 5 % IV SOLN: INTRAVENOUS | Status: DC

## 2019-07-09 MED ORDER — PROPOFOL 10 MG/ML IV BOLUS
INTRAVENOUS | Status: AC
  Filled 2019-07-09: qty 20

## 2019-07-09 MED ORDER — LACTATED RINGERS IV BOLUS
1000.0000 mL | Freq: Once | INTRAVENOUS | Status: AC
  Administered 2019-07-09: 1000 mL via INTRAVENOUS

## 2019-07-09 MED ORDER — ROCURONIUM BROMIDE 10 MG/ML (PF) SYRINGE
PREFILLED_SYRINGE | INTRAVENOUS | Status: AC
  Filled 2019-07-09: qty 10

## 2019-07-09 MED ORDER — ACETAMINOPHEN 325 MG PO TABS: 325.0000 mg | ORAL_TABLET | ORAL | Status: DC | PRN

## 2019-07-09 MED ORDER — DEXAMETHASONE SODIUM PHOSPHATE 10 MG/ML IJ SOLN
INTRAMUSCULAR | Status: AC
  Filled 2019-07-09: qty 1

## 2019-07-09 MED ORDER — SUGAMMADEX SODIUM 200 MG/2ML IV SOLN
INTRAVENOUS | Status: DC | PRN
  Administered 2019-07-09: 200 mg via INTRAVENOUS

## 2019-07-09 MED ORDER — OXYCODONE HCL 5 MG PO TABS: 5.0000 mg | ORAL_TABLET | Freq: Once | ORAL | Status: DC | PRN

## 2019-07-09 SURGICAL SUPPLY — 38 items
ATTRACTOMAT 16X20 MAGNETIC DRP (DRAPES) ×4 IMPLANT
BAG ZIPLOCK 12X15 (MISCELLANEOUS) ×4 IMPLANT
BLADE SURG 15 STRL LF DISP TIS (BLADE) ×2 IMPLANT
BLADE SURG 15 STRL SS (BLADE) ×4
BLADE SURG SZ11 CARB STEEL (BLADE) ×4 IMPLANT
CLEANER TIP ELECTROSURG 2X2 (MISCELLANEOUS) ×4 IMPLANT
COVER BACK TABLE 60X90IN (DRAPES) ×4 IMPLANT
COVER SURGICAL LIGHT HANDLE (MISCELLANEOUS) ×4 IMPLANT
COVER WAND RF STERILE (DRAPES) IMPLANT
DISSECTOR ROUND CHERRY 3/8 STR (MISCELLANEOUS) ×4 IMPLANT
DRAPE SHEET LG 3/4 BI-LAMINATE (DRAPES) ×4 IMPLANT
ELECT COATED BLADE 2.86 ST (ELECTRODE) ×4 IMPLANT
ELECT REM PT RETURN 15FT ADLT (MISCELLANEOUS) ×4 IMPLANT
GAUZE 4X4 16PLY RFD (DISPOSABLE) ×4 IMPLANT
GAUZE SPONGE 4X4 12PLY STRL (GAUZE/BANDAGES/DRESSINGS) ×4 IMPLANT
GAUZE XEROFORM 5X9 LF (GAUZE/BANDAGES/DRESSINGS) IMPLANT
HOLDER TRACH TUBE VELCRO 19.5 (MISCELLANEOUS) ×4 IMPLANT
KIT BASIN (CUSTOM PROCEDURE TRAY) ×4 IMPLANT
KIT TURNOVER KIT A (KITS) IMPLANT
NEEDLE HYPO 25X1 1.5 SAFETY (NEEDLE) ×4 IMPLANT
NS IRRIG 1000ML POUR BTL (IV SOLUTION) ×4 IMPLANT
PACK EENT SPLIT (PACKS) ×4 IMPLANT
PATTIES SURGICAL .5 X.5 (GAUZE/BANDAGES/DRESSINGS) ×4 IMPLANT
PENCIL SMOKE EVACUATOR (MISCELLANEOUS) IMPLANT
SPONGE DRAIN TRACH 4X4 STRL 2S (GAUZE/BANDAGES/DRESSINGS) ×4 IMPLANT
SURGILUBE 2OZ TUBE FLIPTOP (MISCELLANEOUS) ×12 IMPLANT
SUT CHROMIC 2 0 SH (SUTURE) ×4 IMPLANT
SUT ETHILON 2 0 PS N (SUTURE) ×8 IMPLANT
SUT SILK 2 0 30  PSL (SUTURE) ×8
SUT SILK 2 0 30 PSL (SUTURE) ×4 IMPLANT
SUT SILK 3 0 (SUTURE) ×4
SUT SILK 3-0 18XBRD TIE 12 (SUTURE) ×2 IMPLANT
SYR 10ML LL (SYRINGE) ×4 IMPLANT
SYR 20ML LL LF (SYRINGE) ×4 IMPLANT
SYR CONTROL 10ML LL (SYRINGE) ×4 IMPLANT
TUBE TRACH SHILEY 4 DIST CUF (TUBING) ×4 IMPLANT
WATER STERILE IRR 1000ML POUR (IV SOLUTION) ×4 IMPLANT
YANKAUER SUCT BULB TIP 10FT TU (MISCELLANEOUS) ×4 IMPLANT

## 2019-07-09 NOTE — Progress Notes (Signed)
OT Cancellation Note  Patient Details Name: Devon Peterson MRN: [id number] DOB: February 26, 1956   Cancelled Treatment:    Reason Eval/Treat Not Completed: Patient not medically ready;Medical issues which prohibited therapy(Patient had surgery today and transferred to higher level of care.)  Leatha Rohner L Marius Betts 07/09/2019, 3:38 PM

## 2019-07-09 NOTE — Progress Notes (Signed)
Hypoglycemia Event   CBG: 24    Treatment: Dextrose 50% 25 g     Symptoms: None    Follow-Up CBG time: 0615   CBG Results:  75    Possible Reasons for Event: NPO   Comments/MD notified:   TRH 1 ordered D5% and LR at 48mL/hr.

## 2019-07-09 NOTE — Op Note (Signed)
Operative Note: DIRECT LARYNGOSCOPY/BIOPSY    TRACHEOSTOMY   Patient: Devon Peterson  Medical record number: [id number]  Date:07/09/2019  Pre-operative Indications: Hypopharyngeal tumor  Postoperative Indications: Same  Surgical Procedure: 1.  Direct Laryngoscopy and Biopsy of hypopharyngeal tumor    2.  Tracheostomy  Anesthesia: GET  Surgeon: Delsa Bern, M.D.  Assist: None  Complications: None  EBL: None   Brief History: The patient is a 64 y.o. male with a history of progressive dysphagia and severe weight loss.  The patient is a chronic smoker.  Patient admitted to [facility] long hospital with progressive cachexia and difficulty swallowing.  Work-up including chest CT scan showed possible mass in the hypopharynx.  Dedicated neck CT scan showed enhancing lesion involving the posterior glottis and esophageal introitus.  Flexible laryngoscopy performed showed a exophytic mass involving the posterior aspect of the glottis with obstruction of the hypopharynx. Given the patient's history and findings I recommended direct laryngoscopy with biopsy of the hypopharyngeal tumor and tracheostomy for airway control under general anesthesia, risks and benefits were discussed in detail with the patient and her family. They understand and agree with our plan for surgery which is scheduled at [facility] on an elective basis.  Surgical Procedure: The patient is brought to the operating room on 07/09/2019 and placed in supine position on the operating table. General endotracheal anesthesia was established without difficulty under direct visualization using the glide laryngoscope.  No bleeding or airway concerns. When the patient was adequately anesthetized, surgical timeout was performed and correct identification of the patient and the surgical procedure. The patient was positioned and prepped and draped in sterile fashion.  A laryngoscope was used to examine the patient's oral cavity,  oropharynx and larynx.  Findings include large exophytic tumor involving the posterior glottis and hypopharynx.  Multiple cup biopsies were taken.  Tissue sent to pathology for gross microscopic evaluation.  There was no significant bleeding and the patient's airway was stable.  The patient was injected with 2 cc of 1% lidocaine 1:100,000 dilution epinephrine, which was injected in the anterior neck skin at the proposed incision.  The patient was positioned and prepped and draped in sterile fashion.  A horizontal skin incision was created in a pre-existing skin crease, this was carried through the skin underlying subcutaneous tissue with a #15 scalpel.  Bovie electrocautery was then used to dissect the subcutaneous tissue and a small amount of subcutaneous fat was removed.  The strap muscles were identified and lateralized.  The anterior compartment of the neck was visualized in the patient's anterior trachea was palpated.  The thyroid isthmus was identified and divided with Bovie electrocautery.  This allowed direct access to the anterior trachea.  A horizontally oriented tracheotomy incision was created at approximately the second tracheal interspace.  The trachea was suctioned, endotracheal tube withdrawn under direct visualization.  A #4 cuffed Shiley tracheostomy tube was inserted without difficulty and the patient's airway was stabilized.  The patient had good gas exchange, no active bleeding.  The tracheostomy tube was sutured into position with 3-0 Ethilon sutures, a Velcro trach tie was then placed.  An orogastric tube was passed and stomach contents were aspirated. Patient was awakened from anesthetic and transferred from the operating room to the recovery room in stable condition. There were no complications and blood loss was minimal.   Delsa Bern, M.D. [facility] ENT 07/09/2019

## 2019-07-09 NOTE — Consult Note (Addendum)
Devon Peterson  [phone] Fax:([phone]) [phone]   MEDICAL ONCOLOGY - INITIAL CONSULTATION  Referral MD: Dr. 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  He was recently in the hospital from 04/17/2019-04/19/2019 for sepsis secondary to pneumonia and COPD exacerbation.  He has done poorly since hospital discharge.  He has had difficulty swallowing food and has been spitting up saliva.  He has a lot of coughing.  He presented to the emergency room with cough and shortness of breath.  In the ER, he was tachycardic with a heart rate in the 150s and was also tachypneic.  Lactic acid was elevated on admission at 2.5.  Chest x-ray showed left lower lobe airspace opacity concerning for pneumonia.  CT angiogram of the chest showed no PE, abnormal soft tissue in the hypopharynx suspicious for underlying mass, dense bilateral lower lobe consolidation left greater than right concerning for aspiration, enhancing mass within the densely consolidated left lower lobe and metastatic disease or primary neoplasm is not excluded.  CT of the abdomen pelvis performed on 07/08/2019 showed no evidence of metastatic disease within the abdomen.  A CT soft tissue neck performed on 07/08/2019 which showed enhancing hypopharyngeal soft tissue in the post cricoid region extending superiorly with effacement of the piriform sinuses and thickening of the aryepiglottic folds, malignancy suspected.  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  Tracheostomy recommended.  They have indicated that he is not a surgical candidate.  The patient underwent biopsy of  his tumor and tracheostomy earlier today.  PCCM was consulted in the PACU due to postop hypotension.  Developed A. fib with RVR postop.  The patient was seen in the ICU from the PACU.  He awakens to voice and shakes head yes/no and can squeeze fingers. Indicates that he has pain at the trach site and that he had shortness of breath. He has some blood oozing from his tracheostomy.  Medical oncology was asked see the patient to make recommendations regarding his hypopharyngeal and left lower lobe lung mass.   Past Medical History:  Diagnosis Date  . Medical history non-contributory   :  Past Surgical History:  Procedure Laterality Date  . NO PAST SURGERIES    :  Current Facility-Administered Medications  Medication Dose Route Frequency Provider Last Rate Last Admin  . [MAR Hold] ampicillin-sulbactam (UNASYN) 1.5 g in sodium chloride 0.9 % 100 mL IVPB  1.5 g Intravenous Q6H Luiz Ochoa, RPH 200 mL/hr at 07/09/19 0330 1.5 g at 07/09/19 0330  . dextrose 5 % in lactated ringers infusion   Intravenous Continuous Lang Snow, FNP 75 mL/hr at 07/09/19 4627 New Bag at 07/09/19 0350  . [MAR Hold] enoxaparin (LOVENOX) injection 40 mg  40 mg Subcutaneous Q24H Shela Leff, MD      . Doug Sou Hold] folic acid (FOLVITE) tablet 1 mg  1 mg Oral Daily Shela Leff, MD   Stopped at 07/08/19 1027  . [MAR Hold] ipratropium-albuterol (DUONEB) 0.5-2.5 (3) MG/3ML nebulizer solution 3 mL  3 mL Nebulization Q6H PRN Shela Leff, MD      . lidocaine (XYLOCAINE) 4 % (PF) injection 5 mL  5 mL Other Once  Janeece Riggers, MD      . Doug Sou Hold] LORazepam (ATIVAN) tablet 1-4 mg  1-4 mg Oral Q1H PRN Shela Leff, MD       Or  . Doug Sou Hold] LORazepam (ATIVAN) injection 1-4 mg  1-4 mg Intravenous Q1H PRN Shela Leff, MD      . Doug Sou Hold] multivitamin with minerals tablet 1 tablet  1 tablet Oral Daily Shela Leff, MD   Stopped at 07/08/19 1027  . [MAR Hold] nicotine (NICODERM CQ - dosed  in mg/24 hours) patch 14 mg  14 mg Transdermal Daily Shela Leff, MD   14 mg at 07/08/19 1028  . [MAR Hold] thiamine tablet 100 mg  100 mg Oral Daily Shela Leff, MD   Stopped at 07/08/19 1027   Or  . Doug Sou Hold] thiamine (B-1) injection 100 mg  100 mg Intravenous Daily Shela Leff, MD   100 mg at 07/07/19 2200     No Known Allergies:  Family History  Problem Relation Age of Onset  . CAD Father   :  Social History   Socioeconomic History  . Marital status: Single    Spouse name: Not on file  . Number of children: Not on file  . Years of education: Not on file  . Highest education level: Not on file  Occupational History  . Not on file  Tobacco Use  . Smoking status: Current Every Day Smoker    Packs/day: 0.50    Types: Cigarettes  . Smokeless tobacco: Never Used  Substance and Sexual Activity  . Alcohol use: Yes    Alcohol/week: 24.0 standard drinks    Types: 24 Cans of beer per week  . Drug use: Not Currently  . Sexual activity: Not on file  Other Topics Concern  . Not on file  Social History Narrative  . Not on file   Social Determinants of Health   Financial Resource Strain:   . Difficulty of Paying Living Expenses:   Food Insecurity:   . Worried About Charity fundraiser in the Last Year:   . Arboriculturist in the Last Year:   Transportation Needs:   . Film/video editor (Medical):   Marland Kitchen Lack of Transportation (Non-Medical):   Physical Activity:   . Days of Exercise per Week:   . Minutes of Exercise per Session:   Stress:   . Feeling of Stress :   Social Connections:   . Frequency of Communication with Friends and Family:   . Frequency of Social Gatherings with Friends and Family:   . Attends Religious Services:   . Active Member of Clubs or Organizations:   . Attends Archivist Meetings:   Marland Kitchen Marital Status:   Intimate Partner Violence:   . Fear of Current or Ex-Partner:   . Emotionally Abused:   Marland Kitchen Physically  Abused:   . Sexually Abused:   :  Review of Systems: Unable to obtain a comprehensive ROS except as noted in the HPI secondary to patient condition.  Exam: Patient Vitals for the past 24 hrs:  BP Temp Temp src Pulse Resp SpO2 Height Weight  07/09/19 0949 (!) 139/91 97.7 F (36.5 C) Oral 80 (!) 24 100 % 5\' 11"  (1.803 m) 42 kg  07/09/19 0546 131/83 97.8 F (36.6 C) -- 84 (!) 23 -- -- --  07/09/19 0325 (!) 135/101 -- -- -- -- -- -- --  07/09/19 0237 (!) 137/102 -- -- -- -- -- -- --  07/08/19 2112  125/83 97.7 F (36.5 C) Oral 86 18 95 % -- --  07/08/19 2033 133/84 98.9 F (37.2 C) -- 91 (!) 22 98 % -- --  07/08/19 1552 135/86 (!) 97.2 F (36.2 C) Oral 83 20 99 % -- --  07/08/19 1140 -- (!) 97.3 F (36.3 C) Oral -- -- -- -- --  07/08/19 1130 (!) 135/94 -- -- (!) 125 (!) 22 94 % -- --    General:  Severe cachexia, temporal muscle wasting noted.   Eyes:  no scleral icterus.     Neck: tracheostomy midline with thick secretions and blood oozing from his tracheostomy site Lymphatics:  Negative cervical, supraclavicular or axillary adenopathy.   Respiratory: Diminished breath sounds.   Cardiovascular: Tachycardic, irregular, no lower extremity edema GI:  abdomen was soft, flat, nontender, nondistended, without organomegaly.   Musculoskeletal:  no spinal tenderness of palpation of vertebral spine.   Skin exam was without echymosis, petichae.   Neuro: Awakens to voice.     Lab Results  Component Value Date   WBC 8.8 07/09/2019   HGB 11.1 (L) 07/09/2019   HCT 34.8 (L) 07/09/2019   PLT 297 07/09/2019   GLUCOSE 47 (L) 07/09/2019   ALT 11 07/09/2019   AST 18 07/09/2019   NA 145 07/09/2019   K 3.8 07/09/2019   CL 106 07/09/2019   CREATININE 0.59 (L) 07/09/2019   BUN 18 07/09/2019   CO2 22 07/09/2019    CT Soft Tissue Neck W Contrast  Result Date: 07/08/2019 CLINICAL DATA:  Neck mass EXAM: CT NECK WITH CONTRAST TECHNIQUE: Multidetector CT imaging of the neck was performed using  the standard protocol following the bolus administration of intravenous contrast. CONTRAST:  74mL OMNIPAQUE IOHEXOL 300 MG/ML  SOLN COMPARISON:  None. FINDINGS: Evaluation is limited by poor soft tissue resolution in the setting significant cachexia and probable diffuse edema. Additionally, motion artifact is present. Pharynx and larynx: There is enhancing soft tissue in the post cricoid region extending superiorly with effacement of the piriform sinuses and thickening of the aryepiglottic folds. Salivary glands: Unremarkable. Thyroid: Unremarkable. Lymph nodes: No definite enlarged lymph nodes. Vascular: Major neck vessels are patent. There is calcified plaque at the right greater than left ICA origins. There may be significant stenosis on the right. Limited intracranial: No abnormal enhancement. Visualized orbits: Unremarkable. Mastoids and visualized paranasal sinuses: Bilateral maxillary sinus air-fluid levels. Visualized mastoid air cells are clear. Skeleton: Multilevel degenerative changes of the cervical spine. Upper chest: Emphysema. New extension of centrilobular/tree-in-bud opacities within the left upper lobe. Probable small left pleural effusion Other: None. IMPRESSION: Suboptimal evaluation due to motion artifact and poor soft tissue resolution in the setting of significant cachexia and probable diffuse edema. Enhancing hypopharyngeal soft tissue in the post cricoid region extending superiorly with effacement of the piriform sinuses and thickening of the aryepiglottic folds. Malignancy is suspected and direct visual inspection is recommended. Extension of pneumonia into the left upper lobe. Probable small left pleural effusion. Maxillary sinus air-fluid levels, a nonspecific finding that can reflect acute sinusitis in the appropriate setting. Electronically Signed   By: Macy Mis M.D.   On: 07/08/2019 13:45   CT Angio Chest PE W and/or Wo Contrast  Result Date: 07/07/2019 CLINICAL DATA:  Failure  to thrive, history of pneumonia, cough EXAM: CT ANGIOGRAPHY CHEST WITH CONTRAST TECHNIQUE: Multidetector CT imaging of the chest was performed using the standard protocol during bolus administration of intravenous contrast. Multiplanar CT image reconstructions and MIPs were obtained to evaluate  the vascular anatomy. CONTRAST:  39mL OMNIPAQUE IOHEXOL 350 MG/ML SOLN COMPARISON:  07/07/2019 FINDINGS: Cardiovascular: This is a technically adequate evaluation of the pulmonary vasculature. No filling defects or pulmonary emboli. The heart is unremarkable without pericardial effusion. Mild calcification of the mitral and aortic valves. Mild atherosclerosis of the coronary vasculature. Mediastinum/Nodes: No enlarged mediastinal, hilar, or axillary lymph nodes. Thyroid gland, trachea, and esophagus demonstrate no significant findings. Because of marked thoracic kyphosis and positioning, a significant portion of the hypopharynx is included on this exam. There is asymmetric soft tissue in the supraglottic region, incompletely evaluated on this study. I am suspicious of a mass in the right hypopharynx, reference image 15. Follow-up CT neck with contrast or direct visual inspection is recommended. Lungs/Pleura: There is severe background emphysema. Bilateral lower lobe consolidation is seen, left greater than right. Given the above findings in the hypopharynx, aspiration should be considered in addition to community acquired pneumonia. Within the densely consolidated left lower lobe there is evidence of an enhancing 2.7 cm mass, which could reflect metastatic disease. This is best seen on image 126 of series 4. No effusion or pneumothorax. Upper Abdomen: Patient is markedly cachectic. No gross abnormalities are visualized. Musculoskeletal: No acute or destructive bony lesions. Reconstructed images demonstrate no additional findings. Review of the MIP images confirms the above findings. IMPRESSION: 1. No evidence of pulmonary  embolus. 2. Abnormal soft tissue in the hypopharynx, suspicious for underlying mass. CT neck or direct visual inspection recommended for further evaluation. 3. Dense bilateral lower lobe consolidation, left greater than right. Aspiration is suspected given findings in the hypopharynx. 4. There is evidence of an enhancing mass within the densely consolidated left lower lobe, metastatic disease or primary neoplasm not excluded. 5. Marked cachexia. Electronically Signed   By: Randa Ngo M.D.   On: 07/07/2019 19:12   CT ABDOMEN PELVIS W CONTRAST  Result Date: 07/08/2019 CLINICAL DATA:  Lymphoma staging. EXAM: CT ABDOMEN AND PELVIS WITH CONTRAST TECHNIQUE: Multidetector CT imaging of the abdomen and pelvis was performed using the standard protocol following bolus administration of intravenous contrast. CONTRAST:  101mL OMNIPAQUE IOHEXOL 300 MG/ML  SOLN COMPARISON:  CT chest 07/07/2019 FINDINGS: Lower chest: Lung bases demonstrate interval worsening bibasilar consolidation left much worse than right. Rim enhancing low-density structure within the dense consolidation in the left base without significant change which could represent underlying malignancy versus necrotic infection or pulmonary abscess. Calcification of the mitral valve annulus. Hepatobiliary: Liver, gallbladder and biliary tree are normal. Pancreas: Normal. Spleen: Normal. Adrenals/Urinary Tract: Adrenal glands are unremarkable. Kidneys are normal in size and demonstrate bilateral streaky cortical low-attenuation which can be seen in pyelonephritis. No evidence of hydronephrosis. No definite Peri nephric inflammation or fluid. Ureters are not well visualized. Contrast is present within the bladder from patient's recent chest CT. Stomach/Bowel: Stomach and small bowel are unremarkable. Appendix is not well visualized. Colon is unremarkable. Moderate fecal retention over the rectum. Vascular/Lymphatic: Mild calcified plaque over the abdominal aorta which  is normal caliber. No definite adenopathy. Reproductive: Normal. Other: Evidence of cachexia with significant decrease mesenteric fat and subcutaneous fat present. Musculoskeletal: Degenerative change of the spine and hips. IMPRESSION: 1. Interval worsening of bibasilar airspace consolidation left base worse than right likely due to pneumonia. Stable rim enhancing low-density focus over the left base consolidation which may be due to necrosis, pulmonary abscess or underlying malignancy. 2.  No evidence of metastatic disease within the abdomen. 3. Bilateral streaky cortical low-attenuation over the kidneys which can be  seen in pyelonephritis. Recommend clinical correlation. 4.  Aortic Atherosclerosis (ICD10-I70.0). 5. Findings compatible patient's clinical cachexia with significant decreased subcutaneous and mesenteric fat. Electronically Signed   By: Marin Olp M.D.   On: 07/08/2019 13:03   DG CHEST PORT 1 VIEW  Result Date: 07/09/2019 CLINICAL DATA:  Short of breath. EXAM: PORTABLE CHEST 1 VIEW COMPARISON:  07/07/2019 FINDINGS: Normal heart size. Interval complete atelectasis of the left lower lobe with hyperexpansion of the left upper lobe. Progressive airspace disease within the left upper lobe. Right lung is clear. IMPRESSION: 1. Interval complete atelectasis of the left lower lobe which is concerning for either postobstructive changes due to left hilar adenopathy versus mucous plugging. 2. Progressive airspace disease within the hyperexpanded left lung. Electronically Signed   By: Kerby Moors M.D.   On: 07/09/2019 08:26   DG Chest Port 1 View  Result Date: 07/07/2019 CLINICAL DATA:  Cough. EXAM: PORTABLE CHEST 1 VIEW COMPARISON:  04/18/2019 FINDINGS: There is a new left lower lobe airspace opacity concerning for pneumonia. The heart size is stable. Aortic calcifications are noted. The lungs are hyperexpanded. There is no pneumothorax. There is no acute osseous abnormality. IMPRESSION: 1. New left  lower lobe airspace opacity concerning for pneumonia. Follow-up to radiologic resolution is recommended. 2. COPD. Electronically Signed   By: Constance Holster M.D.   On: 07/07/2019 17:18   DG Swallowing Func-Speech Pathology  Result Date: 07/08/2019 Objective Swallowing Evaluation: Type of Study: MBS-Modified Barium Swallow Study  Patient Details Name: Devon Peterson MRN: [id number] Date of Birth: 09/11/1955 Today's Date: 07/08/2019 Time: SLP Start Time (ACUTE ONLY): 3419 -SLP Stop Time (ACUTE ONLY): 1320 SLP Time Calculation (min) (ACUTE ONLY): 25 min Past Medical History: Past Medical History: Diagnosis Date . Medical history non-contributory  Past Surgical History: Past Surgical History: Procedure Laterality Date . NO PAST SURGERIES   HPI: 64 yo with progressive dysphagia, appears cachetic, found to have likely aspiration pna per CT - enhancing mass LLL- suspected aspiration.  . Imaging studies also concerning for soft tissue mass in hypopharynx. Pt has been a consumer of ETOH - beer and smokes cigarettes.   CT neck today showed "Enhancing hypopharyngeal soft tissue in the post cricoid region extending superiorly with effacement of the piriform sinuses andthickening of the aryepiglottic folds."  Pt desired to eat/drink and swallow evaluation had been ordered. SLP advised an MBS to view impact of mass on pharyngeal swallow.  Subjective: pt awake in chair Assessment / Plan / Recommendation CHL IP CLINICAL IMPRESSIONS 07/08/2019 Clinical Impression Pt presents with gross obstructive based dysphagia secondary to his "Enhancing hypopharyngeal soft tissue in the post cricoid region extending superiorly with effacement of the piriform sinuses and thickening of the aryepiglottic folds"per CT imaging.  Gross pharyngeal-cervical esophageal retention mixed with secretions was aspirated post=swallow as residuals spilled into an open airway. Pt consumption of small cup bolus resulted in improved muscular contraction and thus  clearance into esophagus.  Recommend pt be npo except single ice chips after oral care.  Pt was informed to clinical reasoning for recommendations but immediately asked if he could eat - clearly demonstrating compromised understanding. SLP Visit Diagnosis Dysphagia, oropharyngeal phase (R13.12);Dysphagia, pharyngoesophageal phase (R13.14) Attention and concentration deficit following -- Frontal lobe and executive function deficit following -- Impact on safety and function Severe aspiration risk;Risk for inadequate nutrition/hydration   CHL IP TREATMENT RECOMMENDATION 07/08/2019 Treatment Recommendations Therapy as outlined in treatment plan below   Prognosis 07/08/2019 Prognosis for Safe Diet Advancement Guarded Barriers to  Reach Goals -- Barriers/Prognosis Comment severity of dysphagia due to pt's mass CHL IP DIET RECOMMENDATION 07/08/2019 SLP Diet Recommendations NPO;Ice chips PRN after oral care Liquid Administration via -- Medication Administration Via alternative means Compensations (No Data) Postural Changes --   CHL IP OTHER RECOMMENDATIONS 07/08/2019 Recommended Consults -- Oral Care Recommendations Oral care QID Other Recommendations --   CHL IP FOLLOW UP RECOMMENDATIONS 07/08/2019 Follow up Recommendations (No Data)   CHL IP FREQUENCY AND DURATION 07/08/2019 Speech Therapy Frequency (ACUTE ONLY) min 2x/week Treatment Duration 2 weeks      CHL IP ORAL PHASE 07/08/2019 Oral Phase Impaired Oral - Pudding Teaspoon -- Oral - Pudding Cup -- Oral - Honey Teaspoon -- Oral - Honey Cup -- Oral - Nectar Teaspoon Reduced posterior propulsion;Weak lingual manipulation;Delayed oral transit;Other (Comment) Oral - Nectar Cup Reduced posterior propulsion;Weak lingual manipulation;Delayed oral transit;Other (Comment);Lingual/palatal residue Oral - Nectar Straw -- Oral - Thin Teaspoon Weak lingual manipulation;Delayed oral transit;Other (Comment);Premature spillage Oral - Thin Cup Delayed oral transit;Weak lingual manipulation;Reduced  posterior propulsion;Other (Comment);Lingual/palatal residue Oral - Thin Straw -- Oral - Puree -- Oral - Mech Soft -- Oral - Regular -- Oral - Multi-Consistency -- Oral - Pill -- Oral Phase - Comment lingual rocking observed across all boluses  CHL IP PHARYNGEAL PHASE 07/08/2019 Pharyngeal Phase Impaired Pharyngeal- Pudding Teaspoon -- Pharyngeal -- Pharyngeal- Pudding Cup -- Pharyngeal -- Pharyngeal- Honey Teaspoon -- Pharyngeal -- Pharyngeal- Honey Cup -- Pharyngeal -- Pharyngeal- Nectar Teaspoon Pharyngeal residue - pyriform;Pharyngeal residue - posterior pharnyx;Pharyngeal residue - cp segment;Penetration/Apiration after swallow;Moderate aspiration;Lateral channel residue;Inter-arytenoid space residue;Reduced airway/laryngeal closure;Reduced epiglottic inversion;Reduced anterior laryngeal mobility;Reduced laryngeal elevation Pharyngeal Material enters airway, passes BELOW cords and not ejected out despite cough attempt by patient;Material enters airway, passes BELOW cords without attempt by patient to eject out (silent aspiration);Material enters airway, passes BELOW cords then ejected out Pharyngeal- Nectar Cup Penetration/Apiration after swallow;Pharyngeal residue - pyriform;Pharyngeal residue - posterior pharnyx;Pharyngeal residue - cp segment;Inter-arytenoid space residue;Lateral channel residue;Reduced airway/laryngeal closure;Reduced epiglottic inversion;Reduced laryngeal elevation;Reduced anterior laryngeal mobility Pharyngeal Material enters airway, passes BELOW cords without attempt by patient to eject out (silent aspiration) Pharyngeal- Nectar Straw -- Pharyngeal -- Pharyngeal- Thin Teaspoon Penetration/Apiration after swallow;Pharyngeal residue - pyriform;Pharyngeal residue - posterior pharnyx;Pharyngeal residue - cp segment;Inter-arytenoid space residue;Lateral channel residue;Reduced tongue base retraction;Reduced epiglottic inversion;Penetration/Aspiration before swallow;Penetration/Aspiration during  swallow;Moderate aspiration;Reduced airway/laryngeal closure;Reduced laryngeal elevation;Reduced anterior laryngeal mobility Pharyngeal Material enters airway, passes BELOW cords without attempt by patient to eject out (silent aspiration);Material enters airway, passes BELOW cords and not ejected out despite cough attempt by patient Pharyngeal- Thin Cup Pharyngeal residue - pyriform;Pharyngeal residue - posterior pharnyx;Pharyngeal residue - valleculae;Pharyngeal residue - cp segment;Inter-arytenoid space residue;Lateral channel residue;Reduced airway/laryngeal closure;Reduced epiglottic inversion;Reduced laryngeal elevation;Reduced anterior laryngeal mobility Pharyngeal Material enters airway, passes BELOW cords without attempt by patient to eject out (silent aspiration);Material enters airway, passes BELOW cords and not ejected out despite cough attempt by patient Pharyngeal- Thin Straw -- Pharyngeal -- Pharyngeal- Puree -- Pharyngeal -- Pharyngeal- Mechanical Soft -- Pharyngeal -- Pharyngeal- Regular -- Pharyngeal -- Pharyngeal- Multi-consistency -- Pharyngeal -- Pharyngeal- Pill -- Pharyngeal -- Pharyngeal Comment --  CHL IP CERVICAL ESOPHAGEAL PHASE 07/08/2019 Cervical Esophageal Phase Impaired Pudding Teaspoon -- Pudding Cup -- Honey Teaspoon -- Honey Cup -- Nectar Teaspoon -- Nectar Cup -- Nectar Straw -- Thin Teaspoon -- Thin Cup -- Thin Straw -- Puree -- Mechanical Soft -- Regular -- Multi-consistency -- Pill -- Cervical Esophageal Comment Pt with minimal clearance of barium into esophagus due to  his obstructive mass which results in gross retention of barium mixed with secretions with aspiration post=swallow. Kathleen Lime, MS [facility] SLP Acute Rehab Services Office [phone] [name] 07/08/2019, 2:47 PM                CT Soft Tissue Neck W Contrast  Result Date: 07/08/2019 CLINICAL DATA:  Neck mass EXAM: CT NECK WITH CONTRAST TECHNIQUE: Multidetector CT imaging of the neck was performed using the  standard protocol following the bolus administration of intravenous contrast. CONTRAST:  76mL OMNIPAQUE IOHEXOL 300 MG/ML  SOLN COMPARISON:  None. FINDINGS: Evaluation is limited by poor soft tissue resolution in the setting significant cachexia and probable diffuse edema. Additionally, motion artifact is present. Pharynx and larynx: There is enhancing soft tissue in the post cricoid region extending superiorly with effacement of the piriform sinuses and thickening of the aryepiglottic folds. Salivary glands: Unremarkable. Thyroid: Unremarkable. Lymph nodes: No definite enlarged lymph nodes. Vascular: Major neck vessels are patent. There is calcified plaque at the right greater than left ICA origins. There may be significant stenosis on the right. Limited intracranial: No abnormal enhancement. Visualized orbits: Unremarkable. Mastoids and visualized paranasal sinuses: Bilateral maxillary sinus air-fluid levels. Visualized mastoid air cells are clear. Skeleton: Multilevel degenerative changes of the cervical spine. Upper chest: Emphysema. New extension of centrilobular/tree-in-bud opacities within the left upper lobe. Probable small left pleural effusion Other: None. IMPRESSION: Suboptimal evaluation due to motion artifact and poor soft tissue resolution in the setting of significant cachexia and probable diffuse edema. Enhancing hypopharyngeal soft tissue in the post cricoid region extending superiorly with effacement of the piriform sinuses and thickening of the aryepiglottic folds. Malignancy is suspected and direct visual inspection is recommended. Extension of pneumonia into the left upper lobe. Probable small left pleural effusion. Maxillary sinus air-fluid levels, a nonspecific finding that can reflect acute sinusitis in the appropriate setting. Electronically Signed   By: Macy Mis M.D.   On: 07/08/2019 13:45   CT Angio Chest PE W and/or Wo Contrast  Result Date: 07/07/2019 CLINICAL DATA:  Failure to  thrive, history of pneumonia, cough EXAM: CT ANGIOGRAPHY CHEST WITH CONTRAST TECHNIQUE: Multidetector CT imaging of the chest was performed using the standard protocol during bolus administration of intravenous contrast. Multiplanar CT image reconstructions and MIPs were obtained to evaluate the vascular anatomy. CONTRAST:  47mL OMNIPAQUE IOHEXOL 350 MG/ML SOLN COMPARISON:  07/07/2019 FINDINGS: Cardiovascular: This is a technically adequate evaluation of the pulmonary vasculature. No filling defects or pulmonary emboli. The heart is unremarkable without pericardial effusion. Mild calcification of the mitral and aortic valves. Mild atherosclerosis of the coronary vasculature. Mediastinum/Nodes: No enlarged mediastinal, hilar, or axillary lymph nodes. Thyroid gland, trachea, and esophagus demonstrate no significant findings. Because of marked thoracic kyphosis and positioning, a significant portion of the hypopharynx is included on this exam. There is asymmetric soft tissue in the supraglottic region, incompletely evaluated on this study. I am suspicious of a mass in the right hypopharynx, reference image 15. Follow-up CT neck with contrast or direct visual inspection is recommended. Lungs/Pleura: There is severe background emphysema. Bilateral lower lobe consolidation is seen, left greater than right. Given the above findings in the hypopharynx, aspiration should be considered in addition to community acquired pneumonia. Within the densely consolidated left lower lobe there is evidence of an enhancing 2.7 cm mass, which could reflect metastatic disease. This is best seen on image 126 of series 4. No effusion or pneumothorax. Upper Abdomen: Patient is markedly cachectic. No gross  abnormalities are visualized. Musculoskeletal: No acute or destructive bony lesions. Reconstructed images demonstrate no additional findings. Review of the MIP images confirms the above findings. IMPRESSION: 1. No evidence of pulmonary  embolus. 2. Abnormal soft tissue in the hypopharynx, suspicious for underlying mass. CT neck or direct visual inspection recommended for further evaluation. 3. Dense bilateral lower lobe consolidation, left greater than right. Aspiration is suspected given findings in the hypopharynx. 4. There is evidence of an enhancing mass within the densely consolidated left lower lobe, metastatic disease or primary neoplasm not excluded. 5. Marked cachexia. Electronically Signed   By: Randa Ngo M.D.   On: 07/07/2019 19:12   CT ABDOMEN PELVIS W CONTRAST  Result Date: 07/08/2019 CLINICAL DATA:  Lymphoma staging. EXAM: CT ABDOMEN AND PELVIS WITH CONTRAST TECHNIQUE: Multidetector CT imaging of the abdomen and pelvis was performed using the standard protocol following bolus administration of intravenous contrast. CONTRAST:  25mL OMNIPAQUE IOHEXOL 300 MG/ML  SOLN COMPARISON:  CT chest 07/07/2019 FINDINGS: Lower chest: Lung bases demonstrate interval worsening bibasilar consolidation left much worse than right. Rim enhancing low-density structure within the dense consolidation in the left base without significant change which could represent underlying malignancy versus necrotic infection or pulmonary abscess. Calcification of the mitral valve annulus. Hepatobiliary: Liver, gallbladder and biliary tree are normal. Pancreas: Normal. Spleen: Normal. Adrenals/Urinary Tract: Adrenal glands are unremarkable. Kidneys are normal in size and demonstrate bilateral streaky cortical low-attenuation which can be seen in pyelonephritis. No evidence of hydronephrosis. No definite Peri nephric inflammation or fluid. Ureters are not well visualized. Contrast is present within the bladder from patient's recent chest CT. Stomach/Bowel: Stomach and small bowel are unremarkable. Appendix is not well visualized. Colon is unremarkable. Moderate fecal retention over the rectum. Vascular/Lymphatic: Mild calcified plaque over the abdominal aorta which  is normal caliber. No definite adenopathy. Reproductive: Normal. Other: Evidence of cachexia with significant decrease mesenteric fat and subcutaneous fat present. Musculoskeletal: Degenerative change of the spine and hips. IMPRESSION: 1. Interval worsening of bibasilar airspace consolidation left base worse than right likely due to pneumonia. Stable rim enhancing low-density focus over the left base consolidation which may be due to necrosis, pulmonary abscess or underlying malignancy. 2.  No evidence of metastatic disease within the abdomen. 3. Bilateral streaky cortical low-attenuation over the kidneys which can be seen in pyelonephritis. Recommend clinical correlation. 4.  Aortic Atherosclerosis (ICD10-I70.0). 5. Findings compatible patient's clinical cachexia with significant decreased subcutaneous and mesenteric fat. Electronically Signed   By: Marin Olp M.D.   On: 07/08/2019 13:03   DG CHEST PORT 1 VIEW  Result Date: 07/09/2019 CLINICAL DATA:  Short of breath. EXAM: PORTABLE CHEST 1 VIEW COMPARISON:  07/07/2019 FINDINGS: Normal heart size. Interval complete atelectasis of the left lower lobe with hyperexpansion of the left upper lobe. Progressive airspace disease within the left upper lobe. Right lung is clear. IMPRESSION: 1. Interval complete atelectasis of the left lower lobe which is concerning for either postobstructive changes due to left hilar adenopathy versus mucous plugging. 2. Progressive airspace disease within the hyperexpanded left lung. Electronically Signed   By: Kerby Moors M.D.   On: 07/09/2019 08:26   DG Chest Port 1 View  Result Date: 07/07/2019 CLINICAL DATA:  Cough. EXAM: PORTABLE CHEST 1 VIEW COMPARISON:  04/18/2019 FINDINGS: There is a new left lower lobe airspace opacity concerning for pneumonia. The heart size is stable. Aortic calcifications are noted. The lungs are hyperexpanded. There is no pneumothorax. There is no acute osseous abnormality. IMPRESSION: 1.  New left  lower lobe airspace opacity concerning for pneumonia. Follow-up to radiologic resolution is recommended. 2. COPD. Electronically Signed   By: Constance Holster M.D.   On: 07/07/2019 17:18   DG Swallowing Func-Speech Pathology  Result Date: 07/08/2019 Objective Swallowing Evaluation: Type of Study: MBS-Modified Barium Swallow Study  Patient Details Name: Devon Peterson MRN: [id number] Date of Birth: 01-19-1956 Today's Date: 07/08/2019 Time: SLP Start Time (ACUTE ONLY): 2952 -SLP Stop Time (ACUTE ONLY): 1320 SLP Time Calculation (min) (ACUTE ONLY): 25 min Past Medical History: Past Medical History: Diagnosis Date . Medical history non-contributory  Past Surgical History: Past Surgical History: Procedure Laterality Date . NO PAST SURGERIES   HPI: 64 yo with progressive dysphagia, appears cachetic, found to have likely aspiration pna per CT - enhancing mass LLL- suspected aspiration.  . Imaging studies also concerning for soft tissue mass in hypopharynx. Pt has been a consumer of ETOH - beer and smokes cigarettes.   CT neck today showed "Enhancing hypopharyngeal soft tissue in the post cricoid region extending superiorly with effacement of the piriform sinuses andthickening of the aryepiglottic folds."  Pt desired to eat/drink and swallow evaluation had been ordered. SLP advised an MBS to view impact of mass on pharyngeal swallow.  Subjective: pt awake in chair Assessment / Plan / Recommendation CHL IP CLINICAL IMPRESSIONS 07/08/2019 Clinical Impression Pt presents with gross obstructive based dysphagia secondary to his "Enhancing hypopharyngeal soft tissue in the post cricoid region extending superiorly with effacement of the piriform sinuses and thickening of the aryepiglottic folds"per CT imaging.  Gross pharyngeal-cervical esophageal retention mixed with secretions was aspirated post=swallow as residuals spilled into an open airway. Pt consumption of small cup bolus resulted in improved muscular contraction and thus  clearance into esophagus.  Recommend pt be npo except single ice chips after oral care.  Pt was informed to clinical reasoning for recommendations but immediately asked if he could eat - clearly demonstrating compromised understanding. SLP Visit Diagnosis Dysphagia, oropharyngeal phase (R13.12);Dysphagia, pharyngoesophageal phase (R13.14) Attention and concentration deficit following -- Frontal lobe and executive function deficit following -- Impact on safety and function Severe aspiration risk;Risk for inadequate nutrition/hydration   CHL IP TREATMENT RECOMMENDATION 07/08/2019 Treatment Recommendations Therapy as outlined in treatment plan below   Prognosis 07/08/2019 Prognosis for Safe Diet Advancement Guarded Barriers to Reach Goals -- Barriers/Prognosis Comment severity of dysphagia due to pt's mass CHL IP DIET RECOMMENDATION 07/08/2019 SLP Diet Recommendations NPO;Ice chips PRN after oral care Liquid Administration via -- Medication Administration Via alternative means Compensations (No Data) Postural Changes --   CHL IP OTHER RECOMMENDATIONS 07/08/2019 Recommended Consults -- Oral Care Recommendations Oral care QID Other Recommendations --   CHL IP FOLLOW UP RECOMMENDATIONS 07/08/2019 Follow up Recommendations (No Data)   CHL IP FREQUENCY AND DURATION 07/08/2019 Speech Therapy Frequency (ACUTE ONLY) min 2x/week Treatment Duration 2 weeks      CHL IP ORAL PHASE 07/08/2019 Oral Phase Impaired Oral - Pudding Teaspoon -- Oral - Pudding Cup -- Oral - Honey Teaspoon -- Oral - Honey Cup -- Oral - Nectar Teaspoon Reduced posterior propulsion;Weak lingual manipulation;Delayed oral transit;Other (Comment) Oral - Nectar Cup Reduced posterior propulsion;Weak lingual manipulation;Delayed oral transit;Other (Comment);Lingual/palatal residue Oral - Nectar Straw -- Oral - Thin Teaspoon Weak lingual manipulation;Delayed oral transit;Other (Comment);Premature spillage Oral - Thin Cup Delayed oral transit;Weak lingual manipulation;Reduced  posterior propulsion;Other (Comment);Lingual/palatal residue Oral - Thin Straw -- Oral - Puree -- Oral - Mech Soft -- Oral - Regular -- Oral - Multi-Consistency -- Oral -  Pill -- Oral Phase - Comment lingual rocking observed across all boluses  CHL IP PHARYNGEAL PHASE 07/08/2019 Pharyngeal Phase Impaired Pharyngeal- Pudding Teaspoon -- Pharyngeal -- Pharyngeal- Pudding Cup -- Pharyngeal -- Pharyngeal- Honey Teaspoon -- Pharyngeal -- Pharyngeal- Honey Cup -- Pharyngeal -- Pharyngeal- Nectar Teaspoon Pharyngeal residue - pyriform;Pharyngeal residue - posterior pharnyx;Pharyngeal residue - cp segment;Penetration/Apiration after swallow;Moderate aspiration;Lateral channel residue;Inter-arytenoid space residue;Reduced airway/laryngeal closure;Reduced epiglottic inversion;Reduced anterior laryngeal mobility;Reduced laryngeal elevation Pharyngeal Material enters airway, passes BELOW cords and not ejected out despite cough attempt by patient;Material enters airway, passes BELOW cords without attempt by patient to eject out (silent aspiration);Material enters airway, passes BELOW cords then ejected out Pharyngeal- Nectar Cup Penetration/Apiration after swallow;Pharyngeal residue - pyriform;Pharyngeal residue - posterior pharnyx;Pharyngeal residue - cp segment;Inter-arytenoid space residue;Lateral channel residue;Reduced airway/laryngeal closure;Reduced epiglottic inversion;Reduced laryngeal elevation;Reduced anterior laryngeal mobility Pharyngeal Material enters airway, passes BELOW cords without attempt by patient to eject out (silent aspiration) Pharyngeal- Nectar Straw -- Pharyngeal -- Pharyngeal- Thin Teaspoon Penetration/Apiration after swallow;Pharyngeal residue - pyriform;Pharyngeal residue - posterior pharnyx;Pharyngeal residue - cp segment;Inter-arytenoid space residue;Lateral channel residue;Reduced tongue base retraction;Reduced epiglottic inversion;Penetration/Aspiration before swallow;Penetration/Aspiration during  swallow;Moderate aspiration;Reduced airway/laryngeal closure;Reduced laryngeal elevation;Reduced anterior laryngeal mobility Pharyngeal Material enters airway, passes BELOW cords without attempt by patient to eject out (silent aspiration);Material enters airway, passes BELOW cords and not ejected out despite cough attempt by patient Pharyngeal- Thin Cup Pharyngeal residue - pyriform;Pharyngeal residue - posterior pharnyx;Pharyngeal residue - valleculae;Pharyngeal residue - cp segment;Inter-arytenoid space residue;Lateral channel residue;Reduced airway/laryngeal closure;Reduced epiglottic inversion;Reduced laryngeal elevation;Reduced anterior laryngeal mobility Pharyngeal Material enters airway, passes BELOW cords without attempt by patient to eject out (silent aspiration);Material enters airway, passes BELOW cords and not ejected out despite cough attempt by patient Pharyngeal- Thin Straw -- Pharyngeal -- Pharyngeal- Puree -- Pharyngeal -- Pharyngeal- Mechanical Soft -- Pharyngeal -- Pharyngeal- Regular -- Pharyngeal -- Pharyngeal- Multi-consistency -- Pharyngeal -- Pharyngeal- Pill -- Pharyngeal -- Pharyngeal Comment --  CHL IP CERVICAL ESOPHAGEAL PHASE 07/08/2019 Cervical Esophageal Phase Impaired Pudding Teaspoon -- Pudding Cup -- Honey Teaspoon -- Honey Cup -- Nectar Teaspoon -- Nectar Cup -- Nectar Straw -- Thin Teaspoon -- Thin Cup -- Thin Straw -- Puree -- Mechanical Soft -- Regular -- Multi-consistency -- Pill -- Cervical Esophageal Comment Pt with minimal clearance of barium into esophagus due to his obstructive mass which results in gross retention of barium mixed with secretions with aspiration post=swallow. Kathleen Lime, MS [facility] SLP Acute Rehab Services Office [phone] [name] 07/08/2019, 2:47 PM              Assessment and Plan:  1.  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  Left lower lobe of the lung mass concerning for primary neoplasm versus metastatic disease  -07/07/2019 CT angiogram of the chest 2.7 cm mass in the left lower lobe of the lung 3.  Cachexia 4.  Aspiration pneumonia 5.  Mild anemia 6.  Atrial fibrillation with RVR 6.  COPD 8.  Alcohol abuse 9.  Tobacco dependence 10.  Cocaine abuse  Devon Peterson is a 64 year old male with a past medical history significant for COPD and polysubstance abuse including alcohol, tobacco, and cocaine.  He presented with increased cough and shortness of breath and was found to have a mass in the hypopharyngeal region as well as a left lower lobe lung mass - ? Mass vs. Infection vs, inflammation.  Status post tracheostomy placement on 07/09/2019.  Biopsies were taken and are pending.  He has significant cachexia.  Recommendations: 1. Will await recovery from respiratory failure.  2. Will await pathology. 3. Consult radiation oncology. 4. Will consider PET scan as outpatient.  5.  Recommend PEG tube for severe cachexia/protein calorie malnutrition. 6.  Antibiotics per primary team for aspiration pneumonia.  Thank you for this referral.   Mikey Bussing, DNP, AGPCNP-BC, AOCNP  Devon Peterson was examined.  I reviewed the CT images and operative note.  He presents with an obstructing hypopharyngeal mass and underwent a palliative tracheostomy today.  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  There is consolidation in the left lower lung with suspicion of a mass.  If there is a mass this could represent a primary lung cancer or a metastasis.  The mass can be followed on serial imaging and we can consider a percutaneous biopsy.  We will follow up on the pathology from the biopsy today.  I will discuss the case with radiation oncology.  He may be a candidate for palliative or definitive radiation.  I doubt he will be a candidate for systemic therapy.  He will need a  feeding tube if he undergoes treatment.  I will check on him tomorrow.  Outpatient follow-up will be scheduled at the Cancer center.

## 2019-07-09 NOTE — Anesthesia Procedure Notes (Signed)
Procedure Name: Intubation Date/Time: 07/09/2019 10:30 AM Performed by: Silas [place], CRNA Pre-anesthesia Checklist: Patient identified, Emergency Drugs available, Suction available and Patient being monitored Patient Re-evaluated:Patient Re-evaluated prior to induction Oxygen Delivery Method: Circle system utilized Preoxygenation: Pre-oxygenation with 100% oxygen Induction Type: IV induction and Rapid sequence Laryngoscope Size: Glidescope and 3 Grade View: Grade III Tube type: Oral Number of attempts: 1 Airway Equipment and Method: Video-laryngoscopy and Rigid stylet Placement Confirmation: ETT inserted through vocal cords under direct vision,  positive ETCO2 and breath sounds checked- equal and bilateral Secured at: 22 cm Tube secured with: Tape Dental Injury: Teeth and Oropharynx as per pre-operative assessment  Comments: Patient with large known hypoglottic mass. VL achieved view of epiglottis and tube was passed with + ETCO2, BBBS.

## 2019-07-09 NOTE — Progress Notes (Signed)
Initial Hypoglycemia order set placed wrong. MR updated to reflect dose given. CBG taken as necessary. Fluids started as ordered. Pt remains asymptomatic. Resting in bed.   Will continue to assess and monitor.

## 2019-07-09 NOTE — Consult Note (Addendum)
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    ICD-10-CM   1. Aspiration pneumonia of both lungs, unspecified aspiration pneumonia type, unspecified part of lung ([place])  J69.0   2. Lung mass  R91.8   3. Neck mass  R22.1   4. SOB (shortness of breath)  R06.02 DG CHEST PORT 1 VIEW    DG CHEST PORT 1 VIEW    HISTORY OF PRESENT ILLNESS: Devon Peterson is a 64 y.o. male seen at the request of Dr. Wilburn Peterson.  He has a past medical history significant for COPD, polysubstance abuse (alcohol, tobacco, cocaine) and was recently admitted to the hospital from 04/17/2019 through 04/19/2019 for sepsis secondary to pneumonia and COPD exacerbation.  Per family report, he has done poorly since hospital discharge and has continued having difficulty swallowing food, excessive coughing, spitting up saliva and progressive weight loss.  He presented back to the emergency department on 07/07/2019 with cough and shortness of breath.  A chest x-ray on admission showed a left lower lobe airspace opacity concerning for pneumonia.  CT angiogram chest confirmed no pulmonary embolism but did show abnormal soft tissue in the hypopharynx suspicious for underlying mass as well as dense bilateral lower lobe consolidation, left greater than right, concerning for aspiration and a 2.7 cm enhancing mass within the densely consolidated left lower lobe-metastatic disease versus primary malignancy could not be excluded.  A CT soft tissue neck was performed on 07/08/2019 which showed an enhancing hypopharyngeal soft tissue in the  post cricoid region extending superiorly with effacement of the piriform sinuses and thickening of the area epiglottic folds suspicious for malignancy.  CT A/P was also performed on 07/08/2019 and showed no evidence of metastatic disease within the abdomen or pelvis.  He was evaluated by ENT with Dr. Wilburn Peterson, who performed flexible laryngoscopy confirming a large exophytic tumor in the posterior hypopharynx extending into the glottis with some partial airway obstruction.  He recommended direct laryngoscopy with biopsy and tracheostomy for tissue diagnosis and airway protection.  He has also recommended PEG tube placement for nutritional improvement as well as further evaluation of the newly identified lung mass.  Given the location of the hypopharyngeal tumor, he is not felt to be a surgical candidate.  Therefore, we have been asked to consult the patient to discuss the potential role of radiotherapy in the management of his disease.  PREVIOUS RADIATION THERAPY: No  PAST MEDICAL HISTORY:  Past Medical History:  Diagnosis Date  . Medical history non-contributory       PAST SURGICAL HISTORY: Past Surgical History:  Procedure Laterality Date  . NO PAST SURGERIES      FAMILY HISTORY:  Family History  Problem Relation Age of Onset  . CAD Father     SOCIAL HISTORY:  Social History   Socioeconomic History  . Marital status: Single    Spouse name: Not on file  . Number of children: Not on file  . Years of education: Not on file  . Highest education level: Not on file  Occupational History  . Not on file  Tobacco Use  . Smoking  status: Current Every Day Smoker    Packs/day: 0.50    Types: Cigarettes  . Smokeless tobacco: Never Used  Substance and Sexual Activity  . Alcohol use: Yes    Alcohol/week: 24.0 standard drinks    Types: 24 Cans of beer per week  . Drug use: Not Currently  . Sexual activity: Not on file  Other Topics Concern  . Not on file  Social History Narrative  .  Not on file   Social Determinants of Health   Financial Resource Strain:   . Difficulty of Paying Living Expenses:   Food Insecurity:   . Worried About Charity fundraiser in the Last Year:   . Arboriculturist in the Last Year:   Transportation Needs:   . Film/video editor (Medical):   Marland Kitchen Lack of Transportation (Non-Medical):   Physical Activity:   . Days of Exercise per Week:   . Minutes of Exercise per Session:   Stress:   . Feeling of Stress :   Social Connections:   . Frequency of Communication with Friends and Family:   . Frequency of Social Gatherings with Friends and Family:   . Attends Religious Services:   . Active Member of Clubs or Organizations:   . Attends Archivist Meetings:   Marland Kitchen Marital Status:   Intimate Partner Violence:   . Fear of Current or Ex-Partner:   . Emotionally Abused:   Marland Kitchen Physically Abused:   . Sexually Abused:     ALLERGIES: Patient has no known allergies.  MEDICATIONS:  Current Facility-Administered Medications  Medication Dose Route Frequency Provider Last Rate Last Admin  . [MAR Hold] ampicillin-sulbactam (UNASYN) 1.5 g in sodium chloride 0.9 % 100 mL IVPB  1.5 g Intravenous Q6H Devon Peterson, RPH 200 mL/hr at 07/09/19 0330 1.5 g at 07/09/19 1036  . dextrose 5 % in lactated ringers infusion   Intravenous Continuous Devon Snow, FNP 75 mL/hr at 07/09/19 8850 Restarted at 07/09/19 1012  . [MAR Hold] enoxaparin (LOVENOX) injection 40 mg  40 mg Subcutaneous Q24H Devon Leff, Devon Peterson      . Devon Sou Hold] folic acid (FOLVITE) tablet 1 mg  1 mg Oral Daily Devon Leff, Devon Peterson   Stopped at 07/08/19 1027  . [MAR Hold] ipratropium-albuterol (DUONEB) 0.5-2.5 (3) MG/3ML nebulizer solution 3 mL  3 mL Nebulization Q6H PRN Devon Leff, Devon Peterson      . lactated ringers infusion   Intravenous Continuous Devon Riggers, Devon Peterson 50 mL/hr at 07/09/19 1008 Restarted at 07/09/19 1012  . lidocaine (XYLOCAINE) 4 % (PF) injection 5 mL  5 mL Other Once  Devon Riggers, Devon Peterson      . Devon Sou Hold] LORazepam (ATIVAN) tablet 1-4 mg  1-4 mg Oral Q1H PRN Devon Leff, Devon Peterson       Or  . Devon Sou Hold] LORazepam (ATIVAN) injection 1-4 mg  1-4 mg Intravenous Q1H PRN Devon Leff, Devon Peterson      . Devon Sou Hold] multivitamin with minerals tablet 1 tablet  1 tablet Oral Daily Devon Leff, Devon Peterson   Stopped at 07/08/19 1027  . [MAR Hold] nicotine (NICODERM CQ - dosed in mg/24 hours) patch 14 mg  14 mg Transdermal Daily Devon Leff, Devon Peterson   14 mg at 07/08/19 1028  . [MAR Hold] thiamine tablet 100 mg  100 mg Oral Daily Devon Leff, Devon Peterson   Stopped at 07/08/19 1027   Or  . Devon Sou Hold] thiamine (B-1) injection 100 mg  100 mg Intravenous Daily Devon Leff, Devon Peterson  100 mg at 07/07/19 2200   Facility-Administered Medications Ordered in Other Encounters  Medication Dose Route Frequency Provider Last Rate Last Admin  . dexmedetomidine (PRECEDEX) injection   Intravenous Anesthesia Intra-op Devon Sacramento, CRNA   4 mcg at 07/09/19 1028  . fentaNYL (SUBLIMAZE) injection   Intravenous Anesthesia Intra-op Devon Sacramento, CRNA   50 mcg at 07/09/19 1027  . midazolam (VERSED) 5 MG/5ML injection   Intravenous Anesthesia Intra-op Devon Sacramento, CRNA   1 mg at 07/09/19 1013  . phenylephrine (NEO-SYNEPHRINE) injection   Intravenous Anesthesia Intra-op Devon Sacramento, CRNA   120 mcg at 07/09/19 1046  . phenylephrine (NEOSYNEPHRINE) 20-0.9 MG/250ML-% infusion   Intravenous Continuous PRN Devon Sacramento, CRNA 75 mL/hr at 07/09/19 1046 100 mcg/min at 07/09/19 1046    REVIEW OF SYSTEMS:  On review of systems,  the patient has been doing poorly since his hospital discharge in 04/2019 with progressive weight loss, generalized weakness, coughing and difficulty swallowing. He denies chest pain, fevers, chills, or night sweats but has had increased cough with production of clear-whitish sputum and shortness of breath.  He is a longtime smoker and has continued smoking 0.5 ppd of  cigarettes. His urine drug screen on admission was also positive for Cocaine. He has not had any hemoptysis or hematemesis and denies any bowel or bladder disturbances, abdominal pain, nausea or vomiting. He denies any new musculoskeletal or joint aches or pains. A complete review of systems is obtained and is otherwise negative.  PHYSICAL EXAM:  Wt Readings from Last 3 Encounters:  07/09/19 92 lb 9.5 oz (42 kg)  04/18/19 98 lb 1.7 oz (44.5 kg)   Temp Readings from Last 3 Encounters:  07/09/19 97.7 F (36.5 C) (Oral)  04/19/19 98.1 F (36.7 C) (Oral)   BP Readings from Last 3 Encounters:  07/09/19 (!) 139/91  04/19/19 126/87   Pulse Readings from Last 3 Encounters:  07/09/19 80  04/19/19 82   Pain Assessment Pain Score: 0-No pain/10  In general this is a chronically ill, cachectic appearing African American male in no acute distress. He's alert and oriented and appropriate throughout the examination. He has a tracheostomy in place with minimal blood at site.  He is able to shake his head yes and no and responds appropriately to questions and follows commands appropriately.  He is able to whisper occasionally but difficult to understand.  Cardiopulmonary assessment is negative for acute distress and he exhibits normal effort.   KPS = 50  100 - Normal; no complaints; no evidence of disease. 90   - Able to carry on normal activity; minor signs or symptoms of disease. 80   - Normal activity with effort; some signs or symptoms of disease. 34   - Cares for self; unable to carry on normal activity or to do active work. 60   - Requires occasional assistance, but is able to care for most of his personal needs. 50   - Requires considerable assistance and frequent medical care. 20   - Disabled; requires special care and assistance. 73   - Severely disabled; hospital admission is indicated although death not imminent. 18   - Very sick; hospital admission necessary; active supportive treatment  necessary. 10   - Moribund; fatal processes progressing rapidly. 0     - Dead  Karnofsky DA, Abelmann WH, Craver LS and Burchenal [facility] [phone]) The use of the nitrogen mustards in the palliative treatment of carcinoma: with particular reference to bronchogenic carcinoma  Cancer 1 [id number]  LABORATORY DATA:  Lab Results  Component Value Date   WBC 8.8 07/09/2019   HGB 11.1 (L) 07/09/2019   HCT 34.8 (L) 07/09/2019   MCV 88.8 07/09/2019   PLT 297 07/09/2019   Lab Results  Component Value Date   NA 145 07/09/2019   K 3.8 07/09/2019   CL 106 07/09/2019   CO2 22 07/09/2019   Lab Results  Component Value Date   ALT 11 07/09/2019   AST 18 07/09/2019   ALKPHOS 63 07/09/2019   BILITOT 1.1 07/09/2019     RADIOGRAPHY: CT Soft Tissue Neck W Contrast  Result Date: 07/08/2019 CLINICAL DATA:  Neck mass EXAM: CT NECK WITH CONTRAST TECHNIQUE: Multidetector CT imaging of the neck was performed using the standard protocol following the bolus administration of intravenous contrast. CONTRAST:  10mL OMNIPAQUE IOHEXOL 300 MG/ML  SOLN COMPARISON:  None. FINDINGS: Evaluation is limited by poor soft tissue resolution in the setting significant cachexia and probable diffuse edema. Additionally, motion artifact is present. Pharynx and larynx: There is enhancing soft tissue in the post cricoid region extending superiorly with effacement of the piriform sinuses and thickening of the aryepiglottic folds. Salivary glands: Unremarkable. Thyroid: Unremarkable. Lymph nodes: No definite enlarged lymph nodes. Vascular: Major neck vessels are patent. There is calcified plaque at the right greater than left ICA origins. There may be significant stenosis on the right. Limited intracranial: No abnormal enhancement. Visualized orbits: Unremarkable. Mastoids and visualized paranasal sinuses: Bilateral maxillary sinus air-fluid levels. Visualized mastoid air cells are clear. Skeleton: Multilevel degenerative changes of the cervical  spine. Upper chest: Emphysema. New extension of centrilobular/tree-in-bud opacities within the left upper lobe. Probable small left pleural effusion Other: None. IMPRESSION: Suboptimal evaluation due to motion artifact and poor soft tissue resolution in the setting of significant cachexia and probable diffuse edema. Enhancing hypopharyngeal soft tissue in the post cricoid region extending superiorly with effacement of the piriform sinuses and thickening of the aryepiglottic folds. Malignancy is suspected and direct visual inspection is recommended. Extension of pneumonia into the left upper lobe. Probable small left pleural effusion. Maxillary sinus air-fluid levels, a nonspecific finding that can reflect acute sinusitis in the appropriate setting. Electronically Signed   By: Macy Mis M.D.   On: 07/08/2019 13:45   CT Angio Chest PE W and/or Wo Contrast  Result Date: 07/07/2019 CLINICAL DATA:  Failure to thrive, history of pneumonia, cough EXAM: CT ANGIOGRAPHY CHEST WITH CONTRAST TECHNIQUE: Multidetector CT imaging of the chest was performed using the standard protocol during bolus administration of intravenous contrast. Multiplanar CT image reconstructions and MIPs were obtained to evaluate the vascular anatomy. CONTRAST:  44mL OMNIPAQUE IOHEXOL 350 MG/ML SOLN COMPARISON:  07/07/2019 FINDINGS: Cardiovascular: This is a technically adequate evaluation of the pulmonary vasculature. No filling defects or pulmonary emboli. The heart is unremarkable without pericardial effusion. Mild calcification of the mitral and aortic valves. Mild atherosclerosis of the coronary vasculature. Mediastinum/Nodes: No enlarged mediastinal, hilar, or axillary lymph nodes. Thyroid gland, trachea, and esophagus demonstrate no significant findings. Because of marked thoracic kyphosis and positioning, a significant portion of the hypopharynx is included on this exam. There is asymmetric soft tissue in the supraglottic region,  incompletely evaluated on this study. I am suspicious of a mass in the right hypopharynx, reference image 15. Follow-up CT neck with contrast or direct visual inspection is recommended. Lungs/Pleura: There is severe background emphysema. Bilateral lower lobe consolidation is seen, left greater than right. Given the above findings in the  hypopharynx, aspiration should be considered in addition to community acquired pneumonia. Within the densely consolidated left lower lobe there is evidence of an enhancing 2.7 cm mass, which could reflect metastatic disease. This is best seen on image 126 of series 4. No effusion or pneumothorax. Upper Abdomen: Patient is markedly cachectic. No gross abnormalities are visualized. Musculoskeletal: No acute or destructive bony lesions. Reconstructed images demonstrate no additional findings. Review of the MIP images confirms the above findings. IMPRESSION: 1. No evidence of pulmonary embolus. 2. Abnormal soft tissue in the hypopharynx, suspicious for underlying mass. CT neck or direct visual inspection recommended for further evaluation. 3. Dense bilateral lower lobe consolidation, left greater than right. Aspiration is suspected given findings in the hypopharynx. 4. There is evidence of an enhancing mass within the densely consolidated left lower lobe, metastatic disease or primary neoplasm not excluded. 5. Marked cachexia. Electronically Signed   By: Randa Ngo M.D.   On: 07/07/2019 19:12   CT ABDOMEN PELVIS W CONTRAST  Result Date: 07/08/2019 CLINICAL DATA:  Lymphoma staging. EXAM: CT ABDOMEN AND PELVIS WITH CONTRAST TECHNIQUE: Multidetector CT imaging of the abdomen and pelvis was performed using the standard protocol following bolus administration of intravenous contrast. CONTRAST:  4mL OMNIPAQUE IOHEXOL 300 MG/ML  SOLN COMPARISON:  CT chest 07/07/2019 FINDINGS: Lower chest: Lung bases demonstrate interval worsening bibasilar consolidation left much worse than right. Rim  enhancing low-density structure within the dense consolidation in the left base without significant change which could represent underlying malignancy versus necrotic infection or pulmonary abscess. Calcification of the mitral valve annulus. Hepatobiliary: Liver, gallbladder and biliary tree are normal. Pancreas: Normal. Spleen: Normal. Adrenals/Urinary Tract: Adrenal glands are unremarkable. Kidneys are normal in size and demonstrate bilateral streaky cortical low-attenuation which can be seen in pyelonephritis. No evidence of hydronephrosis. No definite Peri nephric inflammation or fluid. Ureters are not well visualized. Contrast is present within the bladder from patient's recent chest CT. Stomach/Bowel: Stomach and small bowel are unremarkable. Appendix is not well visualized. Colon is unremarkable. Moderate fecal retention over the rectum. Vascular/Lymphatic: Mild calcified plaque over the abdominal aorta which is normal caliber. No definite adenopathy. Reproductive: Normal. Other: Evidence of cachexia with significant decrease mesenteric fat and subcutaneous fat present. Musculoskeletal: Degenerative change of the spine and hips. IMPRESSION: 1. Interval worsening of bibasilar airspace consolidation left base worse than right likely due to pneumonia. Stable rim enhancing low-density focus over the left base consolidation which may be due to necrosis, pulmonary abscess or underlying malignancy. 2.  No evidence of metastatic disease within the abdomen. 3. Bilateral streaky cortical low-attenuation over the kidneys which can be seen in pyelonephritis. Recommend clinical correlation. 4.  Aortic Atherosclerosis (ICD10-I70.0). 5. Findings compatible patient's clinical cachexia with significant decreased subcutaneous and mesenteric fat. Electronically Signed   By: Marin Olp M.D.   On: 07/08/2019 13:03   DG CHEST PORT 1 VIEW  Result Date: 07/09/2019 CLINICAL DATA:  Short of breath. EXAM: PORTABLE CHEST 1 VIEW  COMPARISON:  07/07/2019 FINDINGS: Normal heart size. Interval complete atelectasis of the left lower lobe with hyperexpansion of the left upper lobe. Progressive airspace disease within the left upper lobe. Right lung is clear. IMPRESSION: 1. Interval complete atelectasis of the left lower lobe which is concerning for either postobstructive changes due to left hilar adenopathy versus mucous plugging. 2. Progressive airspace disease within the hyperexpanded left lung. Electronically Signed   By: Kerby Moors M.D.   On: 07/09/2019 08:26   DG Chest [facility] [address]  Result Date: 07/07/2019 CLINICAL DATA:  Cough. EXAM: PORTABLE CHEST 1 VIEW COMPARISON:  04/18/2019 FINDINGS: There is a new left lower lobe airspace opacity concerning for pneumonia. The heart size is stable. Aortic calcifications are noted. The lungs are hyperexpanded. There is no pneumothorax. There is no acute osseous abnormality. IMPRESSION: 1. New left lower lobe airspace opacity concerning for pneumonia. Follow-up to radiologic resolution is recommended. 2. COPD. Electronically Signed   By: Constance Holster M.D.   On: 07/07/2019 17:18   DG Swallowing Func-Speech Pathology  Result Date: 07/08/2019 Objective Swallowing Evaluation: Type of Study: MBS-Modified Barium Swallow Study  Patient Details Name: Jammie Clink MRN: [id number] Date of Birth: [birth year] Today's Date: 07/08/2019 Time: SLP Start Time (ACUTE ONLY): 8366 -SLP Stop Time (ACUTE ONLY): 1320 SLP Time Calculation (min) (ACUTE ONLY): 25 min Past Medical History: Past Medical History: Diagnosis Date . Medical history non-contributory  Past Surgical History: Past Surgical History: Procedure Laterality Date . NO PAST SURGERIES   HPI: 64 yo with progressive dysphagia, appears cachetic, found to have likely aspiration pna per CT - enhancing mass LLL- suspected aspiration.  . Imaging studies also concerning for soft tissue mass in hypopharynx. Pt has been a consumer of ETOH - beer and smokes  cigarettes.   CT neck today showed "Enhancing hypopharyngeal soft tissue in the post cricoid region extending superiorly with effacement of the piriform sinuses andthickening of the aryepiglottic folds."  Pt desired to eat/drink and swallow evaluation had been ordered. SLP advised an MBS to view impact of mass on pharyngeal swallow.  Subjective: pt awake in chair Assessment / Plan / Recommendation CHL IP CLINICAL IMPRESSIONS 07/08/2019 Clinical Impression Pt presents with gross obstructive based dysphagia secondary to his "Enhancing hypopharyngeal soft tissue in the post cricoid region extending superiorly with effacement of the piriform sinuses and thickening of the aryepiglottic folds"per CT imaging.  Gross pharyngeal-cervical esophageal retention mixed with secretions was aspirated post=swallow as residuals spilled into an open airway. Pt consumption of small cup bolus resulted in improved muscular contraction and thus clearance into esophagus.  Recommend pt be npo except single ice chips after oral care.  Pt was informed to clinical reasoning for recommendations but immediately asked if he could eat - clearly demonstrating compromised understanding. SLP Visit Diagnosis Dysphagia, oropharyngeal phase (R13.12);Dysphagia, pharyngoesophageal phase (R13.14) Attention and concentration deficit following -- Frontal lobe and executive function deficit following -- Impact on safety and function Severe aspiration risk;Risk for inadequate nutrition/hydration   CHL IP TREATMENT RECOMMENDATION 07/08/2019 Treatment Recommendations Therapy as outlined in treatment plan below   Prognosis 07/08/2019 Prognosis for Safe Diet Advancement Guarded Barriers to Reach Goals -- Barriers/Prognosis Comment severity of dysphagia due to pt's mass CHL IP DIET RECOMMENDATION 07/08/2019 SLP Diet Recommendations NPO;Ice chips PRN after oral care Liquid Administration via -- Medication Administration Via alternative means Compensations (No Data) Postural  Changes --   CHL IP OTHER RECOMMENDATIONS 07/08/2019 Recommended Consults -- Oral Care Recommendations Oral care QID Other Recommendations --   CHL IP FOLLOW UP RECOMMENDATIONS 07/08/2019 Follow up Recommendations (No Data)   CHL IP FREQUENCY AND DURATION 07/08/2019 Speech Therapy Frequency (ACUTE ONLY) min 2x/week Treatment Duration 2 weeks      CHL IP ORAL PHASE 07/08/2019 Oral Phase Impaired Oral - Pudding Teaspoon -- Oral - Pudding Cup -- Oral - Honey Teaspoon -- Oral - Honey Cup -- Oral - Nectar Teaspoon Reduced posterior propulsion;Weak lingual manipulation;Delayed oral transit;Other (Comment) Oral - Nectar Cup Reduced posterior propulsion;Weak lingual manipulation;Delayed oral transit;Other (Comment);Lingual/palatal residue  Oral - Nectar Straw -- Oral - Thin Teaspoon Weak lingual manipulation;Delayed oral transit;Other (Comment);Premature spillage Oral - Thin Cup Delayed oral transit;Weak lingual manipulation;Reduced posterior propulsion;Other (Comment);Lingual/palatal residue Oral - Thin Straw -- Oral - Puree -- Oral - Mech Soft -- Oral - Regular -- Oral - Multi-Consistency -- Oral - Pill -- Oral Phase - Comment lingual rocking observed across all boluses  CHL IP PHARYNGEAL PHASE 07/08/2019 Pharyngeal Phase Impaired Pharyngeal- Pudding Teaspoon -- Pharyngeal -- Pharyngeal- Pudding Cup -- Pharyngeal -- Pharyngeal- Honey Teaspoon -- Pharyngeal -- Pharyngeal- Honey Cup -- Pharyngeal -- Pharyngeal- Nectar Teaspoon Pharyngeal residue - pyriform;Pharyngeal residue - posterior pharnyx;Pharyngeal residue - cp segment;Penetration/Apiration after swallow;Moderate aspiration;Lateral channel residue;Inter-arytenoid space residue;Reduced airway/laryngeal closure;Reduced epiglottic inversion;Reduced anterior laryngeal mobility;Reduced laryngeal elevation Pharyngeal Material enters airway, passes BELOW cords and not ejected out despite cough attempt by patient;Material enters airway, passes BELOW cords without attempt by patient to  eject out (silent aspiration);Material enters airway, passes BELOW cords then ejected out Pharyngeal- Nectar Cup Penetration/Apiration after swallow;Pharyngeal residue - pyriform;Pharyngeal residue - posterior pharnyx;Pharyngeal residue - cp segment;Inter-arytenoid space residue;Lateral channel residue;Reduced airway/laryngeal closure;Reduced epiglottic inversion;Reduced laryngeal elevation;Reduced anterior laryngeal mobility Pharyngeal Material enters airway, passes BELOW cords without attempt by patient to eject out (silent aspiration) Pharyngeal- Nectar Straw -- Pharyngeal -- Pharyngeal- Thin Teaspoon Penetration/Apiration after swallow;Pharyngeal residue - pyriform;Pharyngeal residue - posterior pharnyx;Pharyngeal residue - cp segment;Inter-arytenoid space residue;Lateral channel residue;Reduced tongue base retraction;Reduced epiglottic inversion;Penetration/Aspiration before swallow;Penetration/Aspiration during swallow;Moderate aspiration;Reduced airway/laryngeal closure;Reduced laryngeal elevation;Reduced anterior laryngeal mobility Pharyngeal Material enters airway, passes BELOW cords without attempt by patient to eject out (silent aspiration);Material enters airway, passes BELOW cords and not ejected out despite cough attempt by patient Pharyngeal- Thin Cup Pharyngeal residue - pyriform;Pharyngeal residue - posterior pharnyx;Pharyngeal residue - valleculae;Pharyngeal residue - cp segment;Inter-arytenoid space residue;Lateral channel residue;Reduced airway/laryngeal closure;Reduced epiglottic inversion;Reduced laryngeal elevation;Reduced anterior laryngeal mobility Pharyngeal Material enters airway, passes BELOW cords without attempt by patient to eject out (silent aspiration);Material enters airway, passes BELOW cords and not ejected out despite cough attempt by patient Pharyngeal- Thin Straw -- Pharyngeal -- Pharyngeal- Puree -- Pharyngeal -- Pharyngeal- Mechanical Soft -- Pharyngeal -- Pharyngeal- Regular  -- Pharyngeal -- Pharyngeal- Multi-consistency -- Pharyngeal -- Pharyngeal- Pill -- Pharyngeal -- Pharyngeal Comment --  CHL IP CERVICAL ESOPHAGEAL PHASE 07/08/2019 Cervical Esophageal Phase Impaired Pudding Teaspoon -- Pudding Cup -- Honey Teaspoon -- Honey Cup -- Nectar Teaspoon -- Nectar Cup -- Nectar Straw -- Thin Teaspoon -- Thin Cup -- Thin Straw -- Puree -- Mechanical Soft -- Regular -- Multi-consistency -- Pill -- Cervical Esophageal Comment Pt with minimal clearance of barium into esophagus due to his obstructive mass which results in gross retention of barium mixed with secretions with aspiration post=swallow. Kathleen Lime, MS [facility] SLP Acute Rehab Services Office [phone] [name] 07/08/2019, 2:47 PM                 IMPRESSION/PLAN:   1. 64 y.o. 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  His case and imaging have been personally reviewed by Dr. Tammi Klippel who formulated the current recommendations. His case has also been reviewed with our head and neck cancer specialist, Dr. Isidore Moos and she has agreed to take over his care and treatment planning going forward. Our nurse navigator, [facility], will also be working closely with the patient to help coordinate his care.  At present, his airway  is protected with tracheostomy and an order has been placed for PEG tube placement to improve his nutritional status.  Pending improvement in the patient's performance status and nutritional status as well as further evaluation of the lung mass, likely with PET imaging as an outpatient and potential need for biopsy of the LLL lesion to determine if this is metastatic disease versus a second primary, we will determine if treatment will be of curative intent or more of a palliative approach if metastatic. We discussed the available radiation techniques, and focused on the details  and logistics of delivery. We discussed that daily radiation can be anywhere from 2-7 weeks in duration pending curative versus palliative approach and reviewed the anticipated acute and late sequelae associated with radiation in this setting. We also discussed the potential role for concurrent chemoradiation but this recommendation will also depend on his improvement in nutritional status and performance status as to whether he could tolerate this more aggressive approach to treatment. PEG tube placement will be incredibly important in helping the patient regain strength and tolerate his treatments so we are hopeful that this procedure will take place in the very near future.  The patient was encouraged to ask questions that were answered to his satisfaction.  We will continue to follow him via close correspondence with his medical team and Dr. Isidore Moos will collaborate with Dr. Benay Spice to determine a more formal treatment plan going forward.  Since his airway is protected with tracheostomy, the current focus is on improving nutritional and performance status so that he will better tolerate treatment  He appears to have a good understanding of his disease and our recommendations and is comfortable and in agreement with the stated plan.  The time spent during this encounter was 70 minutes with greater than 50% of that time spent in floor time, discussing his case and coordinating his care.   Nicholos Johns, PA-C    Tyler Pita, Devon Peterson  [facility] Direct Dial: [phone]  Fax: [phone] [email or web address]  Skype  LinkedIn

## 2019-07-09 NOTE — Consult Note (Signed)
NAME:  Devon Peterson, MRN:  [id number], DOB:  08/25/1955, LOS: 2 ADMISSION DATE:  07/07/2019, CONSULTATION DATE:  07/09/19  REFERRING MD:  Alfredia Ferguson, CHIEF COMPLAINT:  Post-operative hypotension   Brief History   64 year old male patient admitted 5/4 in the setting of aspiration pneumonia with newly identified mass located in the hypopharynx.  Went to the operating room on 5/6 for direct laryngoscopy, biopsy, and tracheostomy.  Critical care asked to see for post-hypotension   History of present illness   This is a 64 year old male admitted 5/4 w/ chief complaint of cough, shortness of breath, fatigue, decreased appetite and unintentional weight loss.  On presentation found to have difficulty swallowing with poor p.o. intake and his own saliva.  Admitted with working diagnosis of sepsis secondary to aspiration.  Further diagnostics by CT imaging showed soft tissue in the hypopharynx suspicious for underlying mass a follow-up CT showed hypopharyngeal soft tissue mass, ENT was consulted on 5/5.  A flexible laryngoscopy was performed at bedside.  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  He went to the operating room on 5/6 for direct laryngoscopy, biopsy, and tracheostomy.  Critical care was consulted on 5/6 for postoperative ventilator management  Past Medical History  Chronic tobacco abuse,COPD, Progressive weight loss and cachexia, dysphagia  Significant Hospital Events   5/4 admitted 5/5 seen by ENT as initial evaluation underwent flexible laryngoscopy 5/6 to operating room for direct laryngoscopy and biopsy, and tracheostomy placement pulmonary asked to evaluate postop for hypotension  Consults:  ENT Pulmonary Procedures:  Tracheostomy 5/6  Significant Diagnostic Tests:    Micro Data:  resp panel 5/4: neg  UC 5/4: mult orgs BCx 2 5/6: >>>  Antimicrobials:  Unasyn 5/4>>>  Interim  history/subjective:  Now s/p trach still   Objective   Blood pressure (Abnormal) 139/91, pulse 80, temperature 97.7 F (36.5 C), temperature source Oral, resp. rate (Abnormal) 24, height 5\' 11"  (1.803 m), weight 42 kg, SpO2 100 %.        Intake/Output Summary (Last 24 hours) at 07/09/2019 1255 Last data filed at 07/09/2019 1136 Gross per 24 hour  Intake 3639.38 ml  Output 905 ml  Net 2734.38 ml   Filed Weights   07/07/19 2049 07/09/19 0949  Weight: 42 kg 42 kg    Examination: General: this is a cachectic 64 year old black male now s/p trach  HENT: tempora wasting. MM dry. # 6 cuffed trach w/ bloody tracheal secretions  Lungs: decreased t/o no accessory use  Cardiovascular: irreg irreg afib w/ runs of RVR Abdomen: soft not tender  Extremities: dry no edema pulses palp  Neuro: awake cooperative GU: voids  Resolved Hospital Problem list     Assessment & Plan:   S/p trach for new diagnosis of Head and neck cancer  -bx obtained intraoperatively 5/6 Plan Cont routine trach care  Would keep cuff inflated today; can work towards cuff deflation 5/7 atc w/ supplemental oxygen as needed Can try PMV when/if secretions improve.  Suspect he will be trach dependent Will need initial post-op visit w/ ENT but could be released to trach clinic at discretion of ENT onc has been consulted   Post-operative hypotension.  More inclined to think that this is mix of residual anesthesia and  Hypovolemia. No longer meetings SIRS criteria Plan Cont IV hydration (bolusing now) Cont to titrate neo synephrine for SBP > 100 Admit post op  to ICU Will check cortisol  abx per below   Copd w/ h/o tobacco abuse Plan Smoking cessation  Cont BDs  afib w/ RVR -suspect exacerbated by surg Plan Cont IVFs Cont neo  Tele  Aspiration PNA Plan sxn PRN  Day 3 unasyn    H/o ETOH abuse Plan CIWA protocol   Dysphagia, Severe protein calorie malnutrition in setting of malignancy w/associated  severe deconditioning and muscle mass loss  Plan Awaiting PEG tube   Anemia of chronic illness Plan Trend cbc   Best practice:  Diet: NPO Pain/Anxiety/Delirium protocol (if indicated): na VAP protocol (if indicated): na DVT prophylaxis: scd GI prophylaxis: ppi Glucose control:  Mobility: BR Code Status: full code  Family Communication: per primary  Disposition: icu post-op for bp management   Labs   CBC: Recent Labs  Lab 07/07/19 1650 07/08/19 0924 07/09/19 0438  WBC 8.4 6.2 8.8  NEUTROABS 6.3 4.3 6.9  HGB 12.7* 11.0* 11.1*  HCT 38.8* 32.8* 34.8*  MCV 87.2 86.1 88.8  PLT 276 273 852    Basic Metabolic Panel: Recent Labs  Lab 07/07/19 1650 07/08/19 0412 07/08/19 0924 07/09/19 0438  NA 138  --  144 145  K 4.8  --  3.4* 3.8  CL 100  --  107 106  CO2 25  --  24 22  GLUCOSE 100*  --  64* 47*  BUN 27*  --  19 18  CREATININE 0.86  --  0.60* 0.59*  CALCIUM 8.9  --  8.8* 8.8*  MG  --  1.6* 1.6* 1.9  PHOS  --  3.2 3.1 3.7   GFR: Estimated Creatinine Clearance: 55.4 mL/min (A) (by C-G formula based on SCr of 0.59 mg/dL (L)). Recent Labs  Lab 07/07/19 1650 07/07/19 1850 07/08/19 0924 07/09/19 0438  WBC 8.4  --  6.2 8.8  LATICACIDVEN 2.5* 1.7  --   --     Liver Function Tests: Recent Labs  Lab 07/07/19 1650 07/08/19 0924 07/09/19 0438  AST 18 17 18   ALT 11 10 11   ALKPHOS 74 65 63  BILITOT 0.9 1.1 1.1  PROT 7.8 6.7 6.6  ALBUMIN 3.3* 2.9* 2.9*   No results for input(s): LIPASE, AMYLASE in the last 168 hours. No results for input(s): AMMONIA in the last 168 hours.  ABG No results found for: PHART, PCO2ART, PO2ART, HCO3, TCO2, ACIDBASEDEF, O2SAT   Coagulation Profile: Recent Labs  Lab 07/07/19 1650  INR 1.0    Cardiac Enzymes: No results for input(s): CKTOTAL, CKMB, CKMBINDEX, TROPONINI in the last 168 hours.  HbA1C: No results found for: HGBA1C  CBG: Recent Labs  Lab 07/09/19 0549 07/09/19 0615 07/09/19 0842 07/09/19 0947  07/09/19 1140  GLUCAP 30* 75 109* 106* 79    Review of Systems:   Not able   Past Medical History  He,  has a past medical history of Medical history non-contributory.   Surgical History    Past Surgical History:  Procedure Laterality Date  . NO PAST SURGERIES       Social History   reports that he has been smoking cigarettes. He has been smoking about 0.50 packs per day. He has never used smokeless tobacco. He reports current alcohol use of about 24.0 standard drinks of alcohol per week. He reports previous drug use.   Family History   His family history includes CAD in his father.   Allergies No Known Allergies   Home Medications  Prior to Admission medications   Medication Sig  Start Date End Date Taking? Authorizing Provider  azithromycin (ZITHROMAX) 250 MG tablet Take 1 tablet (250 mg total) by mouth daily. Patient not taking: Reported on 07/07/2019 04/19/19   Edwin Dada, MD  cefdinir (OMNICEF) 300 MG capsule Take 1 capsule (300 mg total) by mouth 2 (two) times daily. Patient not taking: Reported on 07/07/2019 04/19/19   Edwin Dada, MD     Critical care time: 33 minutes.      Erick Colace ACNP-BC [place] Pager # [phone] OR # [phone] if no answer

## 2019-07-09 NOTE — Progress Notes (Signed)
ENT Progress Note:  Procedure(s): DIRECT LARYNGOSCOPY WITH BIOPSY TRACHEOSTOMY   Subjective: Stable O/N  Objective: Vital signs in last 24 hours: Temp:  [97.2 F (36.2 C)-98.9 F (37.2 C)] 97.8 F (36.6 C) (05/06 0546) Pulse Rate:  [83-125] 84 (05/06 0546) Resp:  [18-23] 23 (05/06 0546) BP: (121-137)/(83-102) 131/83 (05/06 0546) SpO2:  [90 %-99 %] 95 % (05/05 2112) Weight change:  Last BM Date: (Pt unsure, but stool in underware in ED)  Intake/Output from previous day: 05/05 0701 - 05/06 0700 In: 1719.4 [I.V.:769.3; IV Piggyback:950.1] Out: 1275 [Urine:1275] Intake/Output this shift: No intake/output data recorded.  Labs: Recent Labs    07/08/19 0924 07/09/19 0438  WBC 6.2 8.8  HGB 11.0* 11.1*  HCT 32.8* 34.8*  PLT 273 297   Recent Labs    07/08/19 0924 07/09/19 0438  NA 144 145  K 3.4* 3.8  CL 107 106  CO2 24 22  GLUCOSE 64* 47*  BUN 19 18  CALCIUM 8.8* 8.8*    Studies/Results: CT Soft Tissue Neck W Contrast  Result Date: 07/08/2019 CLINICAL DATA:  Neck mass EXAM: CT NECK WITH CONTRAST TECHNIQUE: Multidetector CT imaging of the neck was performed using the standard protocol following the bolus administration of intravenous contrast. CONTRAST:  82mL OMNIPAQUE IOHEXOL 300 MG/ML  SOLN COMPARISON:  None. FINDINGS: Evaluation is limited by poor soft tissue resolution in the setting significant cachexia and probable diffuse edema. Additionally, motion artifact is present. Pharynx and larynx: There is enhancing soft tissue in the post cricoid region extending superiorly with effacement of the piriform sinuses and thickening of the aryepiglottic folds. Salivary glands: Unremarkable. Thyroid: Unremarkable. Lymph nodes: No definite enlarged lymph nodes. Vascular: Major neck vessels are patent. There is calcified plaque at the right greater than left ICA origins. There may be significant stenosis on the right. Limited intracranial: No abnormal enhancement. Visualized  orbits: Unremarkable. Mastoids and visualized paranasal sinuses: Bilateral maxillary sinus air-fluid levels. Visualized mastoid air cells are clear. Skeleton: Multilevel degenerative changes of the cervical spine. Upper chest: Emphysema. New extension of centrilobular/tree-in-bud opacities within the left upper lobe. Probable small left pleural effusion Other: None. IMPRESSION: Suboptimal evaluation due to motion artifact and poor soft tissue resolution in the setting of significant cachexia and probable diffuse edema. Enhancing hypopharyngeal soft tissue in the post cricoid region extending superiorly with effacement of the piriform sinuses and thickening of the aryepiglottic folds. Malignancy is suspected and direct visual inspection is recommended. Extension of pneumonia into the left upper lobe. Probable small left pleural effusion. Maxillary sinus air-fluid levels, a nonspecific finding that can reflect acute sinusitis in the appropriate setting. Electronically Signed   By: Macy Mis M.D.   On: 07/08/2019 13:45   CT Angio Chest PE W and/or Wo Contrast  Result Date: 07/07/2019 CLINICAL DATA:  Failure to thrive, history of pneumonia, cough EXAM: CT ANGIOGRAPHY CHEST WITH CONTRAST TECHNIQUE: Multidetector CT imaging of the chest was performed using the standard protocol during bolus administration of intravenous contrast. Multiplanar CT image reconstructions and MIPs were obtained to evaluate the vascular anatomy. CONTRAST:  1mL OMNIPAQUE IOHEXOL 350 MG/ML SOLN COMPARISON:  07/07/2019 FINDINGS: Cardiovascular: This is a technically adequate evaluation of the pulmonary vasculature. No filling defects or pulmonary emboli. The heart is unremarkable without pericardial effusion. Mild calcification of the mitral and aortic valves. Mild atherosclerosis of the coronary vasculature. Mediastinum/Nodes: No enlarged mediastinal, hilar, or axillary lymph nodes. Thyroid gland, trachea, and esophagus demonstrate no  significant findings. Because of marked thoracic  kyphosis and positioning, a significant portion of the hypopharynx is included on this exam. There is asymmetric soft tissue in the supraglottic region, incompletely evaluated on this study. I am suspicious of a mass in the right hypopharynx, reference image 15. Follow-up CT neck with contrast or direct visual inspection is recommended. Lungs/Pleura: There is severe background emphysema. Bilateral lower lobe consolidation is seen, left greater than right. Given the above findings in the hypopharynx, aspiration should be considered in addition to community acquired pneumonia. Within the densely consolidated left lower lobe there is evidence of an enhancing 2.7 cm mass, which could reflect metastatic disease. This is best seen on image 126 of series 4. No effusion or pneumothorax. Upper Abdomen: Patient is markedly cachectic. No gross abnormalities are visualized. Musculoskeletal: No acute or destructive bony lesions. Reconstructed images demonstrate no additional findings. Review of the MIP images confirms the above findings. IMPRESSION: 1. No evidence of pulmonary embolus. 2. Abnormal soft tissue in the hypopharynx, suspicious for underlying mass. CT neck or direct visual inspection recommended for further evaluation. 3. Dense bilateral lower lobe consolidation, left greater than right. Aspiration is suspected given findings in the hypopharynx. 4. There is evidence of an enhancing mass within the densely consolidated left lower lobe, metastatic disease or primary neoplasm not excluded. 5. Marked cachexia. Electronically Signed   By: Randa Ngo M.D.   On: 07/07/2019 19:12   CT ABDOMEN PELVIS W CONTRAST  Result Date: 07/08/2019 CLINICAL DATA:  Lymphoma staging. EXAM: CT ABDOMEN AND PELVIS WITH CONTRAST TECHNIQUE: Multidetector CT imaging of the abdomen and pelvis was performed using the standard protocol following bolus administration of intravenous contrast.  CONTRAST:  61mL OMNIPAQUE IOHEXOL 300 MG/ML  SOLN COMPARISON:  CT chest 07/07/2019 FINDINGS: Lower chest: Lung bases demonstrate interval worsening bibasilar consolidation left much worse than right. Rim enhancing low-density structure within the dense consolidation in the left base without significant change which could represent underlying malignancy versus necrotic infection or pulmonary abscess. Calcification of the mitral valve annulus. Hepatobiliary: Liver, gallbladder and biliary tree are normal. Pancreas: Normal. Spleen: Normal. Adrenals/Urinary Tract: Adrenal glands are unremarkable. Kidneys are normal in size and demonstrate bilateral streaky cortical low-attenuation which can be seen in pyelonephritis. No evidence of hydronephrosis. No definite Peri nephric inflammation or fluid. Ureters are not well visualized. Contrast is present within the bladder from patient's recent chest CT. Stomach/Bowel: Stomach and small bowel are unremarkable. Appendix is not well visualized. Colon is unremarkable. Moderate fecal retention over the rectum. Vascular/Lymphatic: Mild calcified plaque over the abdominal aorta which is normal caliber. No definite adenopathy. Reproductive: Normal. Other: Evidence of cachexia with significant decrease mesenteric fat and subcutaneous fat present. Musculoskeletal: Degenerative change of the spine and hips. IMPRESSION: 1. Interval worsening of bibasilar airspace consolidation left base worse than right likely due to pneumonia. Stable rim enhancing low-density focus over the left base consolidation which may be due to necrosis, pulmonary abscess or underlying malignancy. 2.  No evidence of metastatic disease within the abdomen. 3. Bilateral streaky cortical low-attenuation over the kidneys which can be seen in pyelonephritis. Recommend clinical correlation. 4.  Aortic Atherosclerosis (ICD10-I70.0). 5. Findings compatible patient's clinical cachexia with significant decreased subcutaneous  and mesenteric fat. Electronically Signed   By: Marin Olp M.D.   On: 07/08/2019 13:03   DG Chest Port 1 View  Result Date: 07/07/2019 CLINICAL DATA:  Cough. EXAM: PORTABLE CHEST 1 VIEW COMPARISON:  04/18/2019 FINDINGS: There is a new left lower lobe airspace opacity concerning for pneumonia. The  heart size is stable. Aortic calcifications are noted. The lungs are hyperexpanded. There is no pneumothorax. There is no acute osseous abnormality. IMPRESSION: 1. New left lower lobe airspace opacity concerning for pneumonia. Follow-up to radiologic resolution is recommended. 2. COPD. Electronically Signed   By: Constance Holster M.D.   On: 07/07/2019 17:18   DG Swallowing Func-Speech Pathology  Result Date: 07/08/2019 Objective Swallowing Evaluation: Type of Study: MBS-Modified Barium Swallow Study  Patient Details Name: Keylen Eckenrode MRN: [id number] Date of Birth: 10-Oct-1955 Today's Date: 07/08/2019 Time: SLP Start Time (ACUTE ONLY): 9373 -SLP Stop Time (ACUTE ONLY): 1320 SLP Time Calculation (min) (ACUTE ONLY): 25 min Past Medical History: Past Medical History: Diagnosis Date . Medical history non-contributory  Past Surgical History: Past Surgical History: Procedure Laterality Date . NO PAST SURGERIES   HPI: 64 yo with progressive dysphagia, appears cachetic, found to have likely aspiration pna per CT - enhancing mass LLL- suspected aspiration.  . Imaging studies also concerning for soft tissue mass in hypopharynx. Pt has been a consumer of ETOH - beer and smokes cigarettes.   CT neck today showed "Enhancing hypopharyngeal soft tissue in the post cricoid region extending superiorly with effacement of the piriform sinuses andthickening of the aryepiglottic folds."  Pt desired to eat/drink and swallow evaluation had been ordered. SLP advised an MBS to view impact of mass on pharyngeal swallow.  Subjective: pt awake in chair Assessment / Plan / Recommendation CHL IP CLINICAL IMPRESSIONS 07/08/2019 Clinical Impression Pt  presents with gross obstructive based dysphagia secondary to his "Enhancing hypopharyngeal soft tissue in the post cricoid region extending superiorly with effacement of the piriform sinuses and thickening of the aryepiglottic folds"per CT imaging.  Gross pharyngeal-cervical esophageal retention mixed with secretions was aspirated post=swallow as residuals spilled into an open airway. Pt consumption of small cup bolus resulted in improved muscular contraction and thus clearance into esophagus.  Recommend pt be npo except single ice chips after oral care.  Pt was informed to clinical reasoning for recommendations but immediately asked if he could eat - clearly demonstrating compromised understanding. SLP Visit Diagnosis Dysphagia, oropharyngeal phase (R13.12);Dysphagia, pharyngoesophageal phase (R13.14) Attention and concentration deficit following -- Frontal lobe and executive function deficit following -- Impact on safety and function Severe aspiration risk;Risk for inadequate nutrition/hydration   CHL IP TREATMENT RECOMMENDATION 07/08/2019 Treatment Recommendations Therapy as outlined in treatment plan below   Prognosis 07/08/2019 Prognosis for Safe Diet Advancement Guarded Barriers to Reach Goals -- Barriers/Prognosis Comment severity of dysphagia due to pt's mass CHL IP DIET RECOMMENDATION 07/08/2019 SLP Diet Recommendations NPO;Ice chips PRN after oral care Liquid Administration via -- Medication Administration Via alternative means Compensations (No Data) Postural Changes --   CHL IP OTHER RECOMMENDATIONS 07/08/2019 Recommended Consults -- Oral Care Recommendations Oral care QID Other Recommendations --   CHL IP FOLLOW UP RECOMMENDATIONS 07/08/2019 Follow up Recommendations (No Data)   CHL IP FREQUENCY AND DURATION 07/08/2019 Speech Therapy Frequency (ACUTE ONLY) min 2x/week Treatment Duration 2 weeks      CHL IP ORAL PHASE 07/08/2019 Oral Phase Impaired Oral - Pudding Teaspoon -- Oral - Pudding Cup -- Oral - Honey Teaspoon  -- Oral - Honey Cup -- Oral - Nectar Teaspoon Reduced posterior propulsion;Weak lingual manipulation;Delayed oral transit;Other (Comment) Oral - Nectar Cup Reduced posterior propulsion;Weak lingual manipulation;Delayed oral transit;Other (Comment);Lingual/palatal residue Oral - Nectar Straw -- Oral - Thin Teaspoon Weak lingual manipulation;Delayed oral transit;Other (Comment);Premature spillage Oral - Thin Cup Delayed oral transit;Weak lingual manipulation;Reduced posterior propulsion;Other (Comment);Lingual/palatal residue  Oral - Thin Straw -- Oral - Puree -- Oral - Mech Soft -- Oral - Regular -- Oral - Multi-Consistency -- Oral - Pill -- Oral Phase - Comment lingual rocking observed across all boluses  CHL IP PHARYNGEAL PHASE 07/08/2019 Pharyngeal Phase Impaired Pharyngeal- Pudding Teaspoon -- Pharyngeal -- Pharyngeal- Pudding Cup -- Pharyngeal -- Pharyngeal- Honey Teaspoon -- Pharyngeal -- Pharyngeal- Honey Cup -- Pharyngeal -- Pharyngeal- Nectar Teaspoon Pharyngeal residue - pyriform;Pharyngeal residue - posterior pharnyx;Pharyngeal residue - cp segment;Penetration/Apiration after swallow;Moderate aspiration;Lateral channel residue;Inter-arytenoid space residue;Reduced airway/laryngeal closure;Reduced epiglottic inversion;Reduced anterior laryngeal mobility;Reduced laryngeal elevation Pharyngeal Material enters airway, passes BELOW cords and not ejected out despite cough attempt by patient;Material enters airway, passes BELOW cords without attempt by patient to eject out (silent aspiration);Material enters airway, passes BELOW cords then ejected out Pharyngeal- Nectar Cup Penetration/Apiration after swallow;Pharyngeal residue - pyriform;Pharyngeal residue - posterior pharnyx;Pharyngeal residue - cp segment;Inter-arytenoid space residue;Lateral channel residue;Reduced airway/laryngeal closure;Reduced epiglottic inversion;Reduced laryngeal elevation;Reduced anterior laryngeal mobility Pharyngeal Material enters  airway, passes BELOW cords without attempt by patient to eject out (silent aspiration) Pharyngeal- Nectar Straw -- Pharyngeal -- Pharyngeal- Thin Teaspoon Penetration/Apiration after swallow;Pharyngeal residue - pyriform;Pharyngeal residue - posterior pharnyx;Pharyngeal residue - cp segment;Inter-arytenoid space residue;Lateral channel residue;Reduced tongue base retraction;Reduced epiglottic inversion;Penetration/Aspiration before swallow;Penetration/Aspiration during swallow;Moderate aspiration;Reduced airway/laryngeal closure;Reduced laryngeal elevation;Reduced anterior laryngeal mobility Pharyngeal Material enters airway, passes BELOW cords without attempt by patient to eject out (silent aspiration);Material enters airway, passes BELOW cords and not ejected out despite cough attempt by patient Pharyngeal- Thin Cup Pharyngeal residue - pyriform;Pharyngeal residue - posterior pharnyx;Pharyngeal residue - valleculae;Pharyngeal residue - cp segment;Inter-arytenoid space residue;Lateral channel residue;Reduced airway/laryngeal closure;Reduced epiglottic inversion;Reduced laryngeal elevation;Reduced anterior laryngeal mobility Pharyngeal Material enters airway, passes BELOW cords without attempt by patient to eject out (silent aspiration);Material enters airway, passes BELOW cords and not ejected out despite cough attempt by patient Pharyngeal- Thin Straw -- Pharyngeal -- Pharyngeal- Puree -- Pharyngeal -- Pharyngeal- Mechanical Soft -- Pharyngeal -- Pharyngeal- Regular -- Pharyngeal -- Pharyngeal- Multi-consistency -- Pharyngeal -- Pharyngeal- Pill -- Pharyngeal -- Pharyngeal Comment --  CHL IP CERVICAL ESOPHAGEAL PHASE 07/08/2019 Cervical Esophageal Phase Impaired Pudding Teaspoon -- Pudding Cup -- Honey Teaspoon -- Honey Cup -- Nectar Teaspoon -- Nectar Cup -- Nectar Straw -- Thin Teaspoon -- Thin Cup -- Thin Straw -- Puree -- Mechanical Soft -- Regular -- Multi-consistency -- Pill -- Cervical Esophageal Comment Pt  with minimal clearance of barium into esophagus due to his obstructive mass which results in gross retention of barium mixed with secretions with aspiration post=swallow. Kathleen Lime, MS [facility] SLP Acute Rehab Services Office [phone] [name] 07/08/2019, 2:47 PM                PHYSICAL EXAM: No Change   Assessment/Plan: Patient with progressive weight loss and dysphagia. Based on CT and Flex Laryngoscopy, rec Direct Laryngoscopy with Bx and Tracheostomy for airway safety and tissue diagnosis. Scheduled this afternoon, with ICU obs x 24 hrs postop. Given history and findings Rec: PEG tube placement for nutritional improvement, further evaluation of pulmonary issues for possible lung CA, consult medical oncology and rad onc for eval. 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    Jerrell Belfast 07/09/2019, 8:17 AM

## 2019-07-09 NOTE — Progress Notes (Signed)
Patient's CBG rechecked due to earlier episode of hypoglycemia. CBG follow-up is 109. Patient is for the most part alert and oriented x4, although thinks it's 2020. Voicing no c/o pain this am. Does have very weak productive cough of thick tan/white sputum. Slightly tachypneic at 24, but denies feeling SOB and remains on 1 liter O2 with sat of 100%. Eulas Post, RN

## 2019-07-09 NOTE — Transfer of Care (Signed)
Immediate Anesthesia Transfer of Care Note  Patient: Devon Peterson  Procedure(s) Performed: DIRECT LARYNGOSCOPY WITH BIOPSY (N/A Neck) TRACHEOSTOMY (N/A )  Patient Location: PACU  Anesthesia Type:General  Level of Consciousness: drowsy, patient cooperative and responds to stimulation  Airway & Oxygen Therapy: Patient Spontanous Breathing and Patient connected to tracheostomy mask oxygen  Post-op Assessment: Report given to RN and Post -op Vital signs reviewed and unstable, Anesthesiologist notified  Post vital signs: Reviewed and stable  Last Vitals:  Vitals Value Taken Time  BP 129/81 07/09/19 1132  Temp    Pulse    Resp 14 07/09/19 1137  SpO2    Vitals shown include unvalidated device data.  Last Pain:  Vitals:   07/09/19 0949  TempSrc: Oral  PainSc: 0-No pain      Patients Stated Pain Goal: 4 (47/99/87 2158)  Complications: No apparent anesthesia complications

## 2019-07-09 NOTE — Anesthesia Postprocedure Evaluation (Signed)
Anesthesia Post Note  Patient: Devon Peterson  Procedure(s) Performed: DIRECT LARYNGOSCOPY WITH BIOPSY (N/A Neck) TRACHEOSTOMY (N/A )     Patient location during evaluation: PACU Anesthesia Type: General Level of consciousness: awake and alert Pain management: pain level controlled Vital Signs Assessment: post-procedure vital signs reviewed and stable Respiratory status: spontaneous breathing, nonlabored ventilation and respiratory function stable Cardiovascular status: blood pressure returned to baseline and stable Postop Assessment: no apparent nausea or vomiting Anesthetic complications: no Comments:   Pt developed A fib with RVR in PACU (HR 140s). BP remained stable. Gave 5 mg IV metoprolol with improvement in HR to 100s-110s. Pt is to go to ICU postop and therefore will have adequate monitoring and followup.     Last Vitals:  Vitals:   07/09/19 0546 07/09/19 0949  BP: 131/83 (!) 139/91  Pulse: 84 80  Resp: (!) 23 (!) 24  Temp: 36.6 C 36.5 C  SpO2:  100%    Last Pain:  Vitals:   07/09/19 0949  TempSrc: Oral  PainSc: 0-No pain                 Lidia Collum

## 2019-07-09 NOTE — Progress Notes (Signed)
PROGRESS NOTE    Devon Peterson  [id number] DOB: 02-03-1956 DOA: 07/07/2019 PCP: Patient, No Pcp Per  Brief Narrative:  HPI per Dr. Shela Leff on 07/07/19 Devon Peterson is a 64 y.o. male with medical history significant of COPD, polysubstance abuse (alcohol, tobacco, cocaine), hospital admission 04/17/2019-04/19/2019 for sepsis secondary to pneumonia and COPD exacerbation presenting to the ED via EMS for evaluation of cough, shortness of breath, fatigue, and unintentional weight loss.  History provided by patient and his nephew at bedside.  Nephew states patient was admitted to the hospital a month ago for pneumonia and since then has continued to do poorly.  He has difficulty swallowing food and has been spitting up saliva.  He is coughing a lot.  Patient denies fevers, chills, shortness of breath, chest pain, nausea, vomiting, abdominal pain, diarrhea, or dysuria.  Nephew states patient has been smoking a pack of cigarettes daily since a very young age.  He has lost a lot of weight recently. Addendum: 07/07/2019 8:40 PM.  Patient also denied history of hematemesis, hematochezia, or melena.  ED Course: Tachycardic on arrival with heart rate in the 150s, appeared to be sinus rhythm.  Afebrile.  Slightly tachypneic.  Labs showing no leukocytosis.  Initial lactic acid 2.5, repeat after IV fluid pending.  Hemoglobin 12.7, stable compared to labs done in February 2021.  Platelet count normal.  BUN 27, creatinine 0.8.  Albumin 3.3.  LFTs normal.  Blood culture x2 pending.  SARS-CoV-2 PCR test negative.  Influenza panel negative.  UA not suggestive of infection.  Urine culture pending. Chest x-ray showing new left lower lobe airspace opacity concerning for pneumonia. CT angiogram chest negative for PE.  Showing abnormal soft tissue in the hypopharynx suspicious for underlying mass.  Dense bilateral lower lobe consolidation, left greater than right.  Aspiration suspected given findings in the hypopharynx.   Also showing evidence of an enhancing mass within the densely consolidated left lower lobe, metastatic disease or primary neoplasm not excluded.  Marked cachexia.  Patient received ceftriaxone, azithromycin, and 2 L normal saline boluses.  **Interim History He was evaluated by ENT who recommended a tracheostomy.  Tracheostomy was done today.  He is also getting a tissue biopsy and medical oncology as well as radiation oncology were consulted for further evaluation.  Postoperatively he had hypotension and went in A. fib with RVR and started on Neo-Synephrine drip.  Critical care was consulted for further evaluation  Assessment & Plan:   Principal Problem:   Aspiration pneumonia ([place]) Active Problems:   Sepsis ([place])   Dysphagia   Severe protein-calorie malnutrition ([place])   Alcohol use   Pressure injury of skin  Sepsis 2/2 to Aspiration Pneumonia -Was Tachycardic and tachypneic on arrival.  No leukocytosis on labs.  -Does have mild lactic acidosis.   -CT showing dense bilateral lower lobe consolidation, left greater than right suspicious for aspiration pneumonia given given findings suspicious for an abnormal soft tissue mass in the hypopharynx.  -SARS-CoV-2 PCR test negative.  Influenza panel negative.   -Sepsis physiology has improved but he is now hypotensive and see below -Currently satting in the low to mid 90s on room air. -Heart rate has improved after IV fluid boluses by 1 and A. fib with RVR today.   -Continue IV fluid hydration and antibiotic coverage with Unasyn.   -Trend lactate.   -Blood culture x2 showed no growth today at 2 days -CT Neck showed "Extension of pneumonia into the left upper lobe. Probable small left  pleural effusion. Maxillary sinus air-fluid levels, a nonspecific finding that can reflect acute sinusitis in the appropriate setting." -CT Abd and Pelvis showed "Interval worsening of bibasilar airspace consolidation left base worse than right likely due to  pneumonia. Stable rim enhancing low-density focus over the left base consolidation which may be due to necrosis, pulmonary abscess or underlying malignancy.   No evidence of metastatic disease within the abdomen. Bilateral streaky cortical low-attenuation over the kidneys which can be seen in pyelonephritis. Recommend clinical correlation.  Aortic Atherosclerosis (ICD10-I70.0). Findings compatible patient's clinical cachexia with significant decreased subcutaneous and mesenteric fat." -Continuous pulse ox, supplemental oxygen if needed. -Patient now has a tracheostomy and will be admitted to the ICU for postoperative care -Obtain SLP Evaluation and MBS; after MBS was done SLP recommending n.p.o. and ice chips as needed after oral care -Continue to Monitor and repeat CXR in AM -PT OT recommending home health PT  Concern for underlying malignancy: -CT angiogram showing abnormal soft tissue in the hypopharynx suspicious for an underlying mass.  -Also showing evidence of an enhancing mass within the densely consolidated left lower lobe, metastatic disease or primary neoplasm not excluded.   -Patient is severely cachectic.  Endorsing dysphagia and unintentional weight loss.   -Longstanding history of smoking cigarettes. -Ordered CT soft tissue neck with contrast and showed "Suboptimal evaluation due to motion artifact and poor soft tissue resolution in the setting of significant cachexia and probable diffuse edema. Enhancing hypopharyngeal soft tissue in the post cricoid region extending superiorly with effacement of the piriform sinuses and thickening of the aryepiglottic folds. Malignancy is suspected and direct visual inspection is recommended. Extension of pneumonia into the left upper lobe. Probable small left pleural effusion. Maxillary sinus air-fluid levels, a nonspecific finding that can reflect acute sinusitis in the appropriate setting." -CT of the abdomen pelvis showed "Interval worsening of  bibasilar airspace consolidation left base worse than right likely due to pneumonia. Stable rim enhancing low-density focus over the left base consolidation which may be due to necrosis, pulmonary abscess or underlying malignancy.   No evidence of metastatic disease within the abdomen. Bilateral streaky cortical low-attenuation over the kidneys which can be seen in pyelonephritis. Recommend clinical correlation.  Aortic Atherosclerosis (ICD10-I70.0). Findings compatible patient's clinical cachexia with significant decreased subcutaneous and mesenteric fat." -ENT evaluated and recommending a tracheostomy and he is to undergo biopsy -Have notified medical oncology as well as radiation oncology for further evaluation  Dysphagia -Keep n.p.o. at this time, SLP done and MBS and recommending NPO -He is now having a tracheostomy and likely will need a PEG tube placement  A. fib with RVR -Went into A. fib with RVR in the PACU and was given IV metoprolol -Heart rates have improved some but he is hypotensive and had to be placed on a Neo-Synephrine drip -We will not anticoagulate at this time but may need anticoagulation soon -Continue monitor closely and he will be admitted to the ICU for postoperative care  Hypotension -Postoperative hypotension and likely in the setting of his anesthesia and hypovolemia -He is getting bolused and was placed on a Neo-Synephrine drip  -Critical care to manage for now and I spoke with Dr. Carlis Abbott -Patient will undergo to the ICU and will be transferred to the ICU service for now and then once he is medically stable to be transferred back to the hospitalist service  Severe protein calorie malnutrition/Underweight -Patient has poor oral intake, unintentional weight loss, and severe cachexia.   -Nutrition consult ordered  and recommending advancing diet as medically feasible and if appropriate order diet they are recommending Ensure alive p.o. twice daily, Magic cup twice  daily with meals, Juven twice daily, and recommending continue to monitor magnesium, potassium and phosphorus for least 3 days as patient is at risk for refeeding syndrome given his alcohol abuse  Generalized weakness/physical deconditioning:  -PT and OT evaluation recommending Home Health PT  Alcohol use disorder -Tachycardic on arrival, heart rate improved after fluid boluses.  No other signs of withdrawal at this time. -Order CIWA protocol; Ativan as needed.  Thiamine, folate, and multivitamin.  Tobacco Use -Patient has been counseled on smoking cessation.   -NicoDerm patch ordered.  History of Cocaine use -Check UDS and was POSITIVE -Will avoid further Beta Blockers   COPD -Stable.   -No wheezing at this time.   -DuoNebs as needed.  Hypoglycemia -In the setting of poor p.o. intake The patient blood sugar dropped to 24 this AM -He was given D50 and then started on D5 drip -We will continue D5 drip for now -Continue monitor as his BS have been ranging from 24-109  Hypokalemia  -Patient's potassium this morning was 3.8 -Replete with IV KCl 40 mEq yesterday -Continue to monitor and replete as necessary -Repeat CMP in a.m.  Hypomagnesemia -Patient's magnesium level was 1.6 and is now 1.9 -Continue to monitor and replete as necessary -Repeat magnesium level in a.m.  Normocytic Anemia -Patient's hemoglobin/hematocrit is now stable at 11.1/34.8 -Check anemia panel and showed an iron level of 30, U IBC 130, TIBC 150, saturation ratios of 19%, ferritin level 421, folate level 11.5, and vitamin B12 of 281 -Continue to monitor for signs and symptoms of bleeding; currently no overt bleeding noted  -Repeat CBC in a.m.  DVT prophylaxis: Enoxaparin 40 mg sq q24h  Code Status: FULL CODE  Family Communication: No family present at bedside  Disposition Plan: Patient is from home now here with aspiration pneumonia and likely malignancy.  He is unsafe to swallow.  Will need ENT  evaluation and likely biopsy and further oncology evaluation.  May require PEG tube placement this visit. He is being transferred to the ICU for Postoperative Hypotenstion  Status is: Inpatient  Remains inpatient appropriate because:Persistent severe electrolyte disturbances, IV treatments appropriate due to intensity of illness or inability to take PO and Inpatient level of care appropriate due to severity of illness   Dispo: The patient is from: Home              Anticipated d/c is to: Home              Anticipated d/c date is: 3 days              Patient currently is not medically stable to d/c.  Consultants:   ENT  Discussed Case with Oncology Dr. Alen Blew   PCCM   Procedures:  CT Neck and CT Abd/Pelvis  Procedure done by Dr. Wilburn Cornelia  1.  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  Tracheostomy  Antimicrobials:  Anti-infectives (From admission, onward)   Start     Dose/Rate Route Frequency Ordered Stop   07/07/19 2200  [MAR Hold]  ampicillin-sulbactam (UNASYN) 1.5 g in sodium chloride 0.9 % 100 mL IVPB     (MAR Hold  since Thu 07/09/2019 at 0914.Hold Reason: Transfer to a Procedural area.)   1.5 g 200 mL/hr over 30 Minutes Intravenous Every 6 hours 07/07/19 2051     07/07/19 1930  metroNIDAZOLE (FLAGYL) IVPB 500 mg  Status:  Discontinued     500 mg 100 mL/hr over 60 Minutes Intravenous  Once 07/07/19 1922 07/07/19 2003   07/07/19 1800  cefTRIAXone (ROCEPHIN) 1 g in sodium chloride 0.9 % 100 mL IVPB     1 g 200 mL/hr over 30 Minutes Intravenous  Once 07/07/19 1750 07/07/19 1830   07/07/19 1800  azithromycin (ZITHROMAX) 500 mg in sodium chloride 0.9 % 250 mL IVPB     500 mg 250 mL/hr over 60 Minutes Intravenous  Once 07/07/19 1750 07/07/19 1855     Subjective: Seen and examined at bedside prior to his tracheostomy and he states that he is doing okay. No nausea or vomiting. Denies lightheadedness or dizziness. Was  hypoglycemic this morning but was asymptomatic. No other concerns or complaints at this time.   Objective: Vitals:   07/09/19 1245 07/09/19 1300 07/09/19 1315 07/09/19 1330  BP: (!) 141/101 132/85 117/79 (!) 124/110  Pulse:  (!) 103 94   Resp: 14 (!) 22 15 18   Temp:      TempSrc:      SpO2: 96% 100% 100% 100%  Weight:      Height:        Intake/Output Summary (Last 24 hours) at 07/09/2019 1432 Last data filed at 07/09/2019 1136 Gross per 24 hour  Intake 3639.38 ml  Output 905 ml  Net 2734.38 ml   Filed Weights   07/07/19 2049 07/09/19 0949  Weight: 42 kg 42 kg   Examination: Physical Exam:  Constitutional: Is a thin cachectic African-American male who is chronically ill-appearing in no acute distress evaluated in the short stay department prior to his tracheostomy.  Eyes: Lids and conjunctivae normal, sclerae anicteric  ENMT: External Ears, Nose appear normal. Grossly normal hearing.   Neck: Appears normal, supple, no cervical masses, normal ROM, no appreciable thyromegaly; no JVD Respiratory: Diminished to auscultation bilaterally with coarse breath sounds and some rhonchi. Patient is not really tachypneic but is wearing some supplemental oxygen via nasal cannula. Cardiovascular: RRR, no murmurs / rubs / gallops. S1 and S2 auscultated. No extremity edema.  Abdomen: Soft, non-tender, non-distended. Bowel sounds positive.  GU: Deferred. Musculoskeletal: No clubbing / cyanosis of digits/nails. No joint deformity upper and lower extremities.  Skin: No rashes, lesions, ulcers on limited skin evaluation today. No induration; Warm and dry.  Neurologic: CN 2-12 grossly intact with no focal deficits. Romberg sign cerebellar reflexes not assessed.  Psychiatric: Normal judgment and insight. Alert and oriented x 3. Normal mood and appropriate affect.   Data Reviewed: I have personally reviewed following labs and imaging studies  CBC: Recent Labs  Lab 07/07/19 1650 07/08/19 0924  07/09/19 0438  WBC 8.4 6.2 8.8  NEUTROABS 6.3 4.3 6.9  HGB 12.7* 11.0* 11.1*  HCT 38.8* 32.8* 34.8*  MCV 87.2 86.1 88.8  PLT 276 273 063   Basic Metabolic Panel: Recent Labs  Lab 07/07/19 1650 07/08/19 0412 07/08/19 0924 07/09/19 0438  NA 138  --  144 145  K 4.8  --  3.4* 3.8  CL 100  --  107 106  CO2 25  --  24 22  GLUCOSE 100*  --  64* 47*  BUN 27*  --  19 18  CREATININE 0.86  --  0.60* 0.59*  CALCIUM 8.9  --  8.8* 8.8*  MG  --  1.6* 1.6* 1.9  PHOS  --  3.2 3.1 3.7   GFR: Estimated Creatinine Clearance: 55.4 mL/min (A) (by C-G formula based on SCr of 0.59 mg/dL (L)). Liver Function Tests: Recent Labs  Lab 07/07/19 1650 07/08/19 0924 07/09/19 0438  AST 18 17 18   ALT 11 10 11   ALKPHOS 74 65 63  BILITOT 0.9 1.1 1.1  PROT 7.8 6.7 6.6  ALBUMIN 3.3* 2.9* 2.9*   No results for input(s): LIPASE, AMYLASE in the last 168 hours. No results for input(s): AMMONIA in the last 168 hours. Coagulation Profile: Recent Labs  Lab 07/07/19 1650  INR 1.0   Cardiac Enzymes: No results for input(s): CKTOTAL, CKMB, CKMBINDEX, TROPONINI in the last 168 hours. BNP (last 3 results) No results for input(s): PROBNP in the last 8760 hours. HbA1C: No results for input(s): HGBA1C in the last 72 hours. CBG: Recent Labs  Lab 07/09/19 0549 07/09/19 0615 07/09/19 0842 07/09/19 0947 07/09/19 1140  GLUCAP 30* 75 109* 106* 79   Lipid Profile: No results for input(s): CHOL, HDL, LDLCALC, TRIG, CHOLHDL, LDLDIRECT in the last 72 hours. Thyroid Function Tests: No results for input(s): TSH, T4TOTAL, FREET4, T3FREE, THYROIDAB in the last 72 hours. Anemia Panel: Recent Labs    07/09/19 0438  VITAMINB12 281  FOLATE 11.5  FERRITIN 421*  TIBC 160*  IRON 30*  RETICCTPCT 0.9   Sepsis Labs: Recent Labs  Lab 07/07/19 1650 07/07/19 1850  LATICACIDVEN 2.5* 1.7    Recent Results (from the past 240 hour(s))  Blood Culture (routine x 2)     Status: None (Preliminary result)    Collection Time: 07/07/19  4:45 PM   Specimen: BLOOD LEFT HAND  Result Value Ref Range Status   Specimen Description   Final    BLOOD LEFT HAND Performed at [facility] [address], [place], [place] [address]    Special Requests   Final    BOTTLES DRAWN AEROBIC ONLY Blood Culture results may not be optimal due to an inadequate volume of blood received in culture bottles Performed at [facility] [address], [place], [place] [address]    Culture   Final    NO GROWTH 2 DAYS Performed at [facility] [address] [place], [place] [address]    Report Status PENDING  Incomplete  Blood Culture (routine x 2)     Status: None (Preliminary result)   Collection Time: 07/07/19  4:46 PM   Specimen: BLOOD  Result Value Ref Range Status   Specimen Description   Final    BLOOD RIGHT ANTECUBITAL Performed at [facility] [address], [place], [place] [address]    Special Requests   Final    BOTTLES DRAWN AEROBIC AND ANAEROBIC Blood Culture results may not be optimal due to an inadequate volume of blood received in culture bottles Performed at [facility] [address], [place], [place] [address]    Culture   Final    NO GROWTH 2 DAYS Performed at [facility] [address] [place], [place] [address]    Report Status PENDING  Incomplete  Respiratory Panel by RT PCR (Flu A&B, Covid) - Nasopharyngeal Swab     Status: None   Collection Time: 07/07/19  5:14 PM   Specimen: Nasopharyngeal Swab  Result Value Ref Range Status   SARS Coronavirus 2 by RT PCR NEGATIVE  NEGATIVE Final    Comment: (NOTE) SARS-CoV-2 target nucleic acids are NOT DETECTED. The SARS-CoV-2 RNA is generally detectable in upper respiratoy specimens during the acute phase of infection. The lowest concentration of SARS-CoV-2 viral copies this assay can detect is 131 copies/mL. A negative result does not preclude  SARS-Cov-2 infection and should not be used as the sole basis for treatment or other patient management decisions. A negative result may occur with  improper specimen collection/handling, submission of specimen other than nasopharyngeal swab, presence of viral mutation(s) within the areas targeted by this assay, and inadequate number of viral copies (<131 copies/mL). A negative result must be combined with clinical observations, patient history, and epidemiological information. The expected result is Negative. Fact Sheet for Patients:  PinkCheek.be Fact Sheet for Healthcare Providers:  GravelBags.it This test is not yet ap proved or cleared by the [place] FDA and  has been authorized for detection and/or diagnosis of SARS-CoV-2 by FDA under an Emergency Use Authorization (EUA). This EUA will remain  in effect (meaning this test can be used) for the duration of the COVID-19 declaration under Section 564(b)(1) of the Act, 21 U.S.C. section 360bbb-3(b)(1), unless the authorization is terminated or revoked sooner.    Influenza A by PCR NEGATIVE NEGATIVE Final   Influenza B by PCR NEGATIVE NEGATIVE Final    Comment: (NOTE) The Xpert Xpress SARS-CoV-2/FLU/RSV assay is intended as an aid in  the diagnosis of influenza from Nasopharyngeal swab specimens and  should not be used as a sole basis for treatment. Nasal washings and  aspirates are unacceptable for Xpert Xpress SARS-CoV-2/FLU/RSV  testing. Fact Sheet for Patients: PinkCheek.be Fact Sheet for Healthcare Providers: GravelBags.it This test is not yet approved or cleared by the [place] FDA and  has been authorized for detection and/or diagnosis of SARS-CoV-2 by  FDA under an Emergency Use Authorization (EUA). This EUA will remain  in effect (meaning this test can be used) for the duration of the  Covid-19  declaration under Section 564(b)(1) of the Act, 21  U.S.C. section 360bbb-3(b)(1), unless the authorization is  terminated or revoked. Performed at [facility], [place] [address], [place], [place] [address]   Urine culture     Status: Abnormal   Collection Time: 07/07/19  5:29 PM   Specimen: In/Out Cath Urine  Result Value Ref Range Status   Specimen Description   Final    IN/OUT CATH URINE Performed at [facility] [address], [place], [place] [address]    Special Requests   Final    NONE Performed at [facility], [place] [address] [place], [place] [address]    Culture MULTIPLE SPECIES PRESENT, SUGGEST RECOLLECTION (A)  Final   Report Status 07/08/2019 FINAL  Final     RN Pressure Injury Documentation: Pressure Injury 07/07/19 Sacrum Mid;Upper Stage 2 -  Partial thickness loss of dermis presenting as a shallow open injury with a red, pink wound bed without slough. 3 each small separate open areas (Active)  07/07/19 2107  Location: Sacrum  Location Orientation: Mid;Upper  Staging: Stage 2 -  Partial thickness loss of dermis presenting as a shallow open injury with a red, pink wound bed without slough.  Wound Description (Comments): 3 each small separate open areas  Present on Admission: Yes     Estimated body mass index is 12.91 kg/m as calculated from the following:   Height as of this encounter: 5\' 11"  (1.803 m).   Weight as  of this encounter: 42 kg.  Malnutrition Type:  Nutrition Problem: Severe Malnutrition Etiology: chronic illness(COPD)   Malnutrition Characteristics:  Signs/Symptoms: severe fat depletion, severe muscle depletion   Nutrition Interventions:  Interventions: Refer to RD note for recommendations   Radiology Studies: CT Soft Tissue Neck W Contrast  Result Date: 07/08/2019 CLINICAL DATA:  Neck mass EXAM: CT NECK WITH CONTRAST TECHNIQUE: Multidetector CT imaging of the neck was performed  using the standard protocol following the bolus administration of intravenous contrast. CONTRAST:  62mL OMNIPAQUE IOHEXOL 300 MG/ML  SOLN COMPARISON:  None. FINDINGS: Evaluation is limited by poor soft tissue resolution in the setting significant cachexia and probable diffuse edema. Additionally, motion artifact is present. Pharynx and larynx: There is enhancing soft tissue in the post cricoid region extending superiorly with effacement of the piriform sinuses and thickening of the aryepiglottic folds. Salivary glands: Unremarkable. Thyroid: Unremarkable. Lymph nodes: No definite enlarged lymph nodes. Vascular: Major neck vessels are patent. There is calcified plaque at the right greater than left ICA origins. There may be significant stenosis on the right. Limited intracranial: No abnormal enhancement. Visualized orbits: Unremarkable. Mastoids and visualized paranasal sinuses: Bilateral maxillary sinus air-fluid levels. Visualized mastoid air cells are clear. Skeleton: Multilevel degenerative changes of the cervical spine. Upper chest: Emphysema. New extension of centrilobular/tree-in-bud opacities within the left upper lobe. Probable small left pleural effusion Other: None. IMPRESSION: Suboptimal evaluation due to motion artifact and poor soft tissue resolution in the setting of significant cachexia and probable diffuse edema. Enhancing hypopharyngeal soft tissue in the post cricoid region extending superiorly with effacement of the piriform sinuses and thickening of the aryepiglottic folds. Malignancy is suspected and direct visual inspection is recommended. Extension of pneumonia into the left upper lobe. Probable small left pleural effusion. Maxillary sinus air-fluid levels, a nonspecific finding that can reflect acute sinusitis in the appropriate setting. Electronically Signed   By: Macy Mis M.D.   On: 07/08/2019 13:45   CT Angio Chest PE W and/or Wo Contrast  Result Date: 07/07/2019 CLINICAL DATA:   Failure to thrive, history of pneumonia, cough EXAM: CT ANGIOGRAPHY CHEST WITH CONTRAST TECHNIQUE: Multidetector CT imaging of the chest was performed using the standard protocol during bolus administration of intravenous contrast. Multiplanar CT image reconstructions and MIPs were obtained to evaluate the vascular anatomy. CONTRAST:  106mL OMNIPAQUE IOHEXOL 350 MG/ML SOLN COMPARISON:  07/07/2019 FINDINGS: Cardiovascular: This is a technically adequate evaluation of the pulmonary vasculature. No filling defects or pulmonary emboli. The heart is unremarkable without pericardial effusion. Mild calcification of the mitral and aortic valves. Mild atherosclerosis of the coronary vasculature. Mediastinum/Nodes: No enlarged mediastinal, hilar, or axillary lymph nodes. Thyroid gland, trachea, and esophagus demonstrate no significant findings. Because of marked thoracic kyphosis and positioning, a significant portion of the hypopharynx is included on this exam. There is asymmetric soft tissue in the supraglottic region, incompletely evaluated on this study. I am suspicious of a mass in the right hypopharynx, reference image 15. Follow-up CT neck with contrast or direct visual inspection is recommended. Lungs/Pleura: There is severe background emphysema. Bilateral lower lobe consolidation is seen, left greater than right. Given the above findings in the hypopharynx, aspiration should be considered in addition to community acquired pneumonia. Within the densely consolidated left lower lobe there is evidence of an enhancing 2.7 cm mass, which could reflect metastatic disease. This is best seen on image 126 of series 4. No effusion or pneumothorax. Upper Abdomen: Patient is markedly cachectic. No gross abnormalities are visualized.  Musculoskeletal: No acute or destructive bony lesions. Reconstructed images demonstrate no additional findings. Review of the MIP images confirms the above findings. IMPRESSION: 1. No evidence of  pulmonary embolus. 2. Abnormal soft tissue in the hypopharynx, suspicious for underlying mass. CT neck or direct visual inspection recommended for further evaluation. 3. Dense bilateral lower lobe consolidation, left greater than right. Aspiration is suspected given findings in the hypopharynx. 4. There is evidence of an enhancing mass within the densely consolidated left lower lobe, metastatic disease or primary neoplasm not excluded. 5. Marked cachexia. Electronically Signed   By: Randa Ngo M.D.   On: 07/07/2019 19:12   CT ABDOMEN PELVIS W CONTRAST  Result Date: 07/08/2019 CLINICAL DATA:  Lymphoma staging. EXAM: CT ABDOMEN AND PELVIS WITH CONTRAST TECHNIQUE: Multidetector CT imaging of the abdomen and pelvis was performed using the standard protocol following bolus administration of intravenous contrast. CONTRAST:  53mL OMNIPAQUE IOHEXOL 300 MG/ML  SOLN COMPARISON:  CT chest 07/07/2019 FINDINGS: Lower chest: Lung bases demonstrate interval worsening bibasilar consolidation left much worse than right. Rim enhancing low-density structure within the dense consolidation in the left base without significant change which could represent underlying malignancy versus necrotic infection or pulmonary abscess. Calcification of the mitral valve annulus. Hepatobiliary: Liver, gallbladder and biliary tree are normal. Pancreas: Normal. Spleen: Normal. Adrenals/Urinary Tract: Adrenal glands are unremarkable. Kidneys are normal in size and demonstrate bilateral streaky cortical low-attenuation which can be seen in pyelonephritis. No evidence of hydronephrosis. No definite Peri nephric inflammation or fluid. Ureters are not well visualized. Contrast is present within the bladder from patient's recent chest CT. Stomach/Bowel: Stomach and small bowel are unremarkable. Appendix is not well visualized. Colon is unremarkable. Moderate fecal retention over the rectum. Vascular/Lymphatic: Mild calcified plaque over the abdominal  aorta which is normal caliber. No definite adenopathy. Reproductive: Normal. Other: Evidence of cachexia with significant decrease mesenteric fat and subcutaneous fat present. Musculoskeletal: Degenerative change of the spine and hips. IMPRESSION: 1. Interval worsening of bibasilar airspace consolidation left base worse than right likely due to pneumonia. Stable rim enhancing low-density focus over the left base consolidation which may be due to necrosis, pulmonary abscess or underlying malignancy. 2.  No evidence of metastatic disease within the abdomen. 3. Bilateral streaky cortical low-attenuation over the kidneys which can be seen in pyelonephritis. Recommend clinical correlation. 4.  Aortic Atherosclerosis (ICD10-I70.0). 5. Findings compatible patient's clinical cachexia with significant decreased subcutaneous and mesenteric fat. Electronically Signed   By: Marin Olp M.D.   On: 07/08/2019 13:03   DG CHEST PORT 1 VIEW  Result Date: 07/09/2019 CLINICAL DATA:  Short of breath. EXAM: PORTABLE CHEST 1 VIEW COMPARISON:  07/07/2019 FINDINGS: Normal heart size. Interval complete atelectasis of the left lower lobe with hyperexpansion of the left upper lobe. Progressive airspace disease within the left upper lobe. Right lung is clear. IMPRESSION: 1. Interval complete atelectasis of the left lower lobe which is concerning for either postobstructive changes due to left hilar adenopathy versus mucous plugging. 2. Progressive airspace disease within the hyperexpanded left lung. Electronically Signed   By: Kerby Moors M.D.   On: 07/09/2019 08:26   DG Chest Port 1 View  Result Date: 07/07/2019 CLINICAL DATA:  Cough. EXAM: PORTABLE CHEST 1 VIEW COMPARISON:  04/18/2019 FINDINGS: There is a new left lower lobe airspace opacity concerning for pneumonia. The heart size is stable. Aortic calcifications are noted. The lungs are hyperexpanded. There is no pneumothorax. There is no acute osseous abnormality. IMPRESSION: 1.  New left  lower lobe airspace opacity concerning for pneumonia. Follow-up to radiologic resolution is recommended. 2. COPD. Electronically Signed   By: Constance Holster M.D.   On: 07/07/2019 17:18   DG Swallowing Func-Speech Pathology  Result Date: 07/08/2019 Objective Swallowing Evaluation: Type of Study: MBS-Modified Barium Swallow Study  Patient Details Name: Georgi Navarrete MRN: [id number] Date of Birth: 04-Apr-1955 Today's Date: 07/08/2019 Time: SLP Start Time (ACUTE ONLY): 6834 -SLP Stop Time (ACUTE ONLY): 1320 SLP Time Calculation (min) (ACUTE ONLY): 25 min Past Medical History: Past Medical History: Diagnosis Date . Medical history non-contributory  Past Surgical History: Past Surgical History: Procedure Laterality Date . NO PAST SURGERIES   HPI: 64 yo with progressive dysphagia, appears cachetic, found to have likely aspiration pna per CT - enhancing mass LLL- suspected aspiration.  . Imaging studies also concerning for soft tissue mass in hypopharynx. Pt has been a consumer of ETOH - beer and smokes cigarettes.   CT neck today showed "Enhancing hypopharyngeal soft tissue in the post cricoid region extending superiorly with effacement of the piriform sinuses andthickening of the aryepiglottic folds."  Pt desired to eat/drink and swallow evaluation had been ordered. SLP advised an MBS to view impact of mass on pharyngeal swallow.  Subjective: pt awake in chair Assessment / Plan / Recommendation CHL IP CLINICAL IMPRESSIONS 07/08/2019 Clinical Impression Pt presents with gross obstructive based dysphagia secondary to his "Enhancing hypopharyngeal soft tissue in the post cricoid region extending superiorly with effacement of the piriform sinuses and thickening of the aryepiglottic folds"per CT imaging.  Gross pharyngeal-cervical esophageal retention mixed with secretions was aspirated post=swallow as residuals spilled into an open airway. Pt consumption of small cup bolus resulted in improved muscular contraction and  thus clearance into esophagus.  Recommend pt be npo except single ice chips after oral care.  Pt was informed to clinical reasoning for recommendations but immediately asked if he could eat - clearly demonstrating compromised understanding. SLP Visit Diagnosis Dysphagia, oropharyngeal phase (R13.12);Dysphagia, pharyngoesophageal phase (R13.14) Attention and concentration deficit following -- Frontal lobe and executive function deficit following -- Impact on safety and function Severe aspiration risk;Risk for inadequate nutrition/hydration   CHL IP TREATMENT RECOMMENDATION 07/08/2019 Treatment Recommendations Therapy as outlined in treatment plan below   Prognosis 07/08/2019 Prognosis for Safe Diet Advancement Guarded Barriers to Reach Goals -- Barriers/Prognosis Comment severity of dysphagia due to pt's mass CHL IP DIET RECOMMENDATION 07/08/2019 SLP Diet Recommendations NPO;Ice chips PRN after oral care Liquid Administration via -- Medication Administration Via alternative means Compensations (No Data) Postural Changes --   CHL IP OTHER RECOMMENDATIONS 07/08/2019 Recommended Consults -- Oral Care Recommendations Oral care QID Other Recommendations --   CHL IP FOLLOW UP RECOMMENDATIONS 07/08/2019 Follow up Recommendations (No Data)   CHL IP FREQUENCY AND DURATION 07/08/2019 Speech Therapy Frequency (ACUTE ONLY) min 2x/week Treatment Duration 2 weeks      CHL IP ORAL PHASE 07/08/2019 Oral Phase Impaired Oral - Pudding Teaspoon -- Oral - Pudding Cup -- Oral - Honey Teaspoon -- Oral - Honey Cup -- Oral - Nectar Teaspoon Reduced posterior propulsion;Weak lingual manipulation;Delayed oral transit;Other (Comment) Oral - Nectar Cup Reduced posterior propulsion;Weak lingual manipulation;Delayed oral transit;Other (Comment);Lingual/palatal residue Oral - Nectar Straw -- Oral - Thin Teaspoon Weak lingual manipulation;Delayed oral transit;Other (Comment);Premature spillage Oral - Thin Cup Delayed oral transit;Weak lingual  manipulation;Reduced posterior propulsion;Other (Comment);Lingual/palatal residue Oral - Thin Straw -- Oral - Puree -- Oral - Mech Soft -- Oral - Regular -- Oral - Multi-Consistency -- Oral - Pill --  Oral Phase - Comment lingual rocking observed across all boluses  CHL IP PHARYNGEAL PHASE 07/08/2019 Pharyngeal Phase Impaired Pharyngeal- Pudding Teaspoon -- Pharyngeal -- Pharyngeal- Pudding Cup -- Pharyngeal -- Pharyngeal- Honey Teaspoon -- Pharyngeal -- Pharyngeal- Honey Cup -- Pharyngeal -- Pharyngeal- Nectar Teaspoon Pharyngeal residue - pyriform;Pharyngeal residue - posterior pharnyx;Pharyngeal residue - cp segment;Penetration/Apiration after swallow;Moderate aspiration;Lateral channel residue;Inter-arytenoid space residue;Reduced airway/laryngeal closure;Reduced epiglottic inversion;Reduced anterior laryngeal mobility;Reduced laryngeal elevation Pharyngeal Material enters airway, passes BELOW cords and not ejected out despite cough attempt by patient;Material enters airway, passes BELOW cords without attempt by patient to eject out (silent aspiration);Material enters airway, passes BELOW cords then ejected out Pharyngeal- Nectar Cup Penetration/Apiration after swallow;Pharyngeal residue - pyriform;Pharyngeal residue - posterior pharnyx;Pharyngeal residue - cp segment;Inter-arytenoid space residue;Lateral channel residue;Reduced airway/laryngeal closure;Reduced epiglottic inversion;Reduced laryngeal elevation;Reduced anterior laryngeal mobility Pharyngeal Material enters airway, passes BELOW cords without attempt by patient to eject out (silent aspiration) Pharyngeal- Nectar Straw -- Pharyngeal -- Pharyngeal- Thin Teaspoon Penetration/Apiration after swallow;Pharyngeal residue - pyriform;Pharyngeal residue - posterior pharnyx;Pharyngeal residue - cp segment;Inter-arytenoid space residue;Lateral channel residue;Reduced tongue base retraction;Reduced epiglottic inversion;Penetration/Aspiration before  swallow;Penetration/Aspiration during swallow;Moderate aspiration;Reduced airway/laryngeal closure;Reduced laryngeal elevation;Reduced anterior laryngeal mobility Pharyngeal Material enters airway, passes BELOW cords without attempt by patient to eject out (silent aspiration);Material enters airway, passes BELOW cords and not ejected out despite cough attempt by patient Pharyngeal- Thin Cup Pharyngeal residue - pyriform;Pharyngeal residue - posterior pharnyx;Pharyngeal residue - valleculae;Pharyngeal residue - cp segment;Inter-arytenoid space residue;Lateral channel residue;Reduced airway/laryngeal closure;Reduced epiglottic inversion;Reduced laryngeal elevation;Reduced anterior laryngeal mobility Pharyngeal Material enters airway, passes BELOW cords without attempt by patient to eject out (silent aspiration);Material enters airway, passes BELOW cords and not ejected out despite cough attempt by patient Pharyngeal- Thin Straw -- Pharyngeal -- Pharyngeal- Puree -- Pharyngeal -- Pharyngeal- Mechanical Soft -- Pharyngeal -- Pharyngeal- Regular -- Pharyngeal -- Pharyngeal- Multi-consistency -- Pharyngeal -- Pharyngeal- Pill -- Pharyngeal -- Pharyngeal Comment --  CHL IP CERVICAL ESOPHAGEAL PHASE 07/08/2019 Cervical Esophageal Phase Impaired Pudding Teaspoon -- Pudding Cup -- Honey Teaspoon -- Honey Cup -- Nectar Teaspoon -- Nectar Cup -- Nectar Straw -- Thin Teaspoon -- Thin Cup -- Thin Straw -- Puree -- Mechanical Soft -- Regular -- Multi-consistency -- Pill -- Cervical Esophageal Comment Pt with minimal clearance of barium into esophagus due to his obstructive mass which results in gross retention of barium mixed with secretions with aspiration post=swallow. Kathleen Lime, MS [facility] SLP Acute Rehab Services Office [phone] [name] 07/08/2019, 2:47 PM              Scheduled Meds: . [MAR Hold] enoxaparin (LOVENOX) injection  40 mg Subcutaneous Q24H  . [MAR Hold] folic acid  1 mg Oral Daily  . lidocaine  5 mL  Other Once  . metoprolol tartrate      . [MAR Hold] thiamine  100 mg Intravenous Daily   Continuous Infusions: . [MAR Hold] ampicillin-sulbactam (UNASYN) IV 1.5 g (07/09/19 0330)  . dextrose 5% lactated ringers 75 mL/hr at 07/09/19 0624  . lactated ringers 50 mL/hr at 07/09/19 1008    LOS: 2 days   Kerney Elbe, DO [facility] PAGER is on [place]  If 7PM-7AM, please contact night-coverage [email or web address]

## 2019-07-09 NOTE — Anesthesia Preprocedure Evaluation (Addendum)
Anesthesia Evaluation  Patient identified by MRN, date of birth, ID band Patient awake    Reviewed: Allergy & Precautions, H&P , NPO status , Patient's Chart, lab work & pertinent test results, reviewed documented beta blocker date and time   Airway Mallampati: I  TM Distance: >3 FB Neck ROM: full  Mouth opening: Limited Mouth Opening  Dental no notable dental hx. (+) Edentulous Upper, Edentulous Lower   Pulmonary pneumonia, COPD, Current Smoker and Patient abstained from smoking.,    Pulmonary exam normal breath sounds clear to auscultation       Cardiovascular Exercise Tolerance: Good + CAD   Rhythm:regular Rate:Normal     Neuro/Psych PSYCHIATRIC DISORDERS negative neurological ROS     GI/Hepatic negative GI ROS, Neg liver ROS,   Endo/Other  negative endocrine ROS  Renal/GU negative Renal ROS  negative genitourinary   Musculoskeletal   Abdominal   Peds  Hematology negative hematology ROS (+)   Anesthesia Other Findings   Reproductive/Obstetrics negative OB ROS                            Anesthesia Physical Anesthesia Plan  ASA: IV and emergent  Anesthesia Plan: General   Post-op Pain Management:    Induction: Intravenous  PONV Risk Score and Plan: 2 and Ondansetron and Treatment may vary due to age or medical condition  Airway Management Planned: Oral ETT, Video Laryngoscope Planned, Awake Intubation Planned and Fiberoptic Intubation Planned  Additional Equipment:   Intra-op Plan:   Post-operative Plan: Extubation in OR  Informed Consent: I have reviewed the patients History and Physical, chart, labs and discussed the procedure including the risks, benefits and alternatives for the proposed anesthesia with the patient or authorized representative who has indicated his/her understanding and acceptance.     Dental Advisory Given  Plan Discussed with: CRNA and  Anesthesiologist  Anesthesia Plan Comments:        Anesthesia Quick Evaluation

## 2019-07-10 DIAGNOSIS — R221 Localized swelling, mass and lump, neck: Secondary | ICD-10-CM | POA: Diagnosis not present

## 2019-07-10 DIAGNOSIS — R58 Hemorrhage, not elsewhere classified: Secondary | ICD-10-CM | POA: Diagnosis not present

## 2019-07-10 DIAGNOSIS — J690 Pneumonitis due to inhalation of food and vomit: Secondary | ICD-10-CM | POA: Diagnosis not present

## 2019-07-10 DIAGNOSIS — R1310 Dysphagia, unspecified: Secondary | ICD-10-CM

## 2019-07-10 DIAGNOSIS — Z930 Tracheostomy status: Secondary | ICD-10-CM | POA: Diagnosis not present

## 2019-07-10 DIAGNOSIS — J9601 Acute respiratory failure with hypoxia: Secondary | ICD-10-CM | POA: Diagnosis not present

## 2019-07-10 LAB — GLUCOSE, CAPILLARY
Glucose-Capillary: 105 mg/dL — ABNORMAL HIGH (ref 70–99)
Glucose-Capillary: 71 mg/dL (ref 70–99)
Glucose-Capillary: 78 mg/dL (ref 70–99)
Glucose-Capillary: 79 mg/dL (ref 70–99)
Glucose-Capillary: 83 mg/dL (ref 70–99)
Glucose-Capillary: 87 mg/dL (ref 70–99)

## 2019-07-10 LAB — CBC WITH DIFFERENTIAL/PLATELET
Abs Immature Granulocytes: 0.05 10*3/uL (ref 0.00–0.07)
Basophils Absolute: 0 10*3/uL (ref 0.0–0.1)
Basophils Relative: 0 %
Eosinophils Absolute: 0 10*3/uL (ref 0.0–0.5)
Eosinophils Relative: 0 %
HCT: 34.7 % — ABNORMAL LOW (ref 39.0–52.0)
Hemoglobin: 11.5 g/dL — ABNORMAL LOW (ref 13.0–17.0)
Immature Granulocytes: 1 %
Lymphocytes Relative: 14 %
Lymphs Abs: 1.4 10*3/uL (ref 0.7–4.0)
MCH: 28.2 pg (ref 26.0–34.0)
MCHC: 33.1 g/dL (ref 30.0–36.0)
MCV: 85 fL (ref 80.0–100.0)
Monocytes Absolute: 0.9 10*3/uL (ref 0.1–1.0)
Monocytes Relative: 9 %
Neutro Abs: 7.6 10*3/uL (ref 1.7–7.7)
Neutrophils Relative %: 76 %
Platelets: 249 10*3/uL (ref 150–400)
RBC: 4.08 MIL/uL — ABNORMAL LOW (ref 4.22–5.81)
RDW: 12.8 % (ref 11.5–15.5)
WBC: 10 10*3/uL (ref 4.0–10.5)
nRBC: 0 % (ref 0.0–0.2)

## 2019-07-10 LAB — COMPREHENSIVE METABOLIC PANEL
ALT: 10 U/L (ref 0–44)
AST: 19 U/L (ref 15–41)
Albumin: 2.4 g/dL — ABNORMAL LOW (ref 3.5–5.0)
Alkaline Phosphatase: 55 U/L (ref 38–126)
Anion gap: 12 (ref 5–15)
BUN: 10 mg/dL (ref 8–23)
CO2: 27 mmol/L (ref 22–32)
Calcium: 8.6 mg/dL — ABNORMAL LOW (ref 8.9–10.3)
Chloride: 104 mmol/L (ref 98–111)
Creatinine, Ser: 0.51 mg/dL — ABNORMAL LOW (ref 0.61–1.24)
GFR calc Af Amer: >60 mL/min (ref >60)
GFR calc non Af Amer: >60 mL/min (ref >60)
Glucose, Bld: 115 mg/dL — ABNORMAL HIGH (ref 70–99)
Potassium: 3.3 mmol/L — ABNORMAL LOW (ref 3.5–5.1)
Sodium: 143 mmol/L (ref 135–145)
Total Bilirubin: 0.6 mg/dL (ref 0.3–1.2)
Total Protein: 5.8 g/dL — ABNORMAL LOW (ref 6.5–8.1)

## 2019-07-10 LAB — SURGICAL PATHOLOGY

## 2019-07-10 LAB — MAGNESIUM
Magnesium: 1.3 mg/dL — ABNORMAL LOW (ref 1.7–2.4)
Magnesium: 2.3 mg/dL (ref 1.7–2.4)

## 2019-07-10 LAB — PHOSPHORUS: Phosphorus: 2.9 mg/dL (ref 2.5–4.6)

## 2019-07-10 MED ORDER — LIDOCAINE-EPINEPHRINE (PF) 2 %-1:200000 IJ SOLN
20.0000 mL | Freq: Once | INTRAMUSCULAR | Status: AC
  Administered 2019-07-10: 20 mL via INTRADERMAL
  Filled 2019-07-10: qty 20

## 2019-07-10 MED ORDER — LIDOCAINE HCL 2 % IJ SOLN
INTRAMUSCULAR | Status: AC
  Filled 2019-07-10: qty 20

## 2019-07-10 MED ORDER — EPINEPHRINE 1 MG/10ML IJ SOSY
PREFILLED_SYRINGE | INTRAMUSCULAR | Status: AC
  Filled 2019-07-10: qty 20

## 2019-07-10 MED ORDER — MAGNESIUM SULFATE 2 GM/50ML IV SOLN
2.0000 g | Freq: Once | INTRAVENOUS | Status: AC
  Administered 2019-07-10: 2 g via INTRAVENOUS
  Filled 2019-07-10: qty 50

## 2019-07-10 MED ORDER — POTASSIUM CHLORIDE 10 MEQ/100ML IV SOLN
10.0000 meq | INTRAVENOUS | Status: AC
  Administered 2019-07-10 (×6): 10 meq via INTRAVENOUS
  Filled 2019-07-10 (×6): qty 100

## 2019-07-10 MED ORDER — MAGNESIUM SULFATE 4 GM/100ML IV SOLN
4.0000 g | Freq: Once | INTRAVENOUS | Status: AC
  Administered 2019-07-10: 4 g via INTRAVENOUS
  Filled 2019-07-10: qty 100

## 2019-07-10 NOTE — Evaluation (Signed)
Occupational Therapy Evaluation Patient Details Name: Devon Peterson MRN: [id number] DOB: 08-22-1955 Today's Date: 07/10/2019    History of Present Illness 64 yo male admitted with aspiration pneumonia, with newly identified mass located in the hypopharynx.  Went to the operating room on 5/6 for direct laryngoscopy, biopsy, and tracheostomy.  Marland Kitchen Hx of ETOH abuse, COPD, dysphagia, polysubstance abuse, malnutrition,   Clinical Impression   Patient with functional deficits listed below impacting safety and independence with self care. Patient with decreased strength, cachectic appearance. Patient require mod A x2 hand held assist to transfer to bedside chair, total A for LB dressing. Currently recommending SNF due to increased level of assistance, will continue to follow.    Follow Up Recommendations  SNF;Supervision/Assistance - 24 hour    Equipment Recommendations  Other (comment)(defer to next venue)       Precautions / Restrictions Precautions Precautions: Fall Precaution Comments: trach on 28% TC Restrictions Weight Bearing Restrictions: No      Mobility Bed Mobility Overal bed mobility: Needs Assistance Bed Mobility: Supine to Sit     Supine to sit: Min guard     General bed mobility comments: extra time  Transfers Overall transfer level: Needs assistance Equipment used: 2 person hand held assist Transfers: Sit to/from [facility] Sit to Stand: Mod assist;+2 physical assistance;+2 safety/equipment Stand pivot transfers: +2 physical assistance;Mod assist       General transfer comment: small shuffle steps  from bed to recliner, forward flexed trunk    Balance Overall balance assessment: Needs assistance Sitting-balance support: Bilateral upper extremity supported;Feet supported Sitting balance-Leahy Scale: Fair     Standing balance support: Bilateral upper extremity supported;During functional activity Standing balance-Leahy Scale: Poor Standing  balance comment: reliant on support                           ADL either performed or assessed with clinical judgement   ADL Overall ADL's : Needs assistance/impaired Eating/Feeding: NPO   Grooming: Set up;Sitting   Upper Body Bathing: Min guard;Minimal assistance;Sitting   Lower Body Bathing: Maximal assistance;Sit to/from stand;Sitting/lateral leans   Upper Body Dressing : Min guard;Minimal assistance;Sitting   Lower Body Dressing: Maximal assistance;Total assistance;Sitting/lateral leans Lower Body Dressing Details (indicate cue type and reason): total A to don socks Toilet Transfer: Moderate assistance;+2 for physical assistance;+2 for safety/equipment;BSC;Stand-pivot Toilet Transfer Details (indicate cue type and reason): simulated with transfer to [place] and Hygiene: Total assistance;Sitting/lateral lean       Functional mobility during ADLs: Moderate assistance;+2 for physical assistance;+2 for safety/equipment                    Pertinent Vitals/Pain Pain Assessment: No/denies pain     Hand Dominance Right   Extremity/Trunk Assessment Upper Extremity Assessment Upper Extremity Assessment: Generalized weakness   Lower Extremity Assessment Lower Extremity Assessment: Defer to PT evaluation       Communication Communication Communication: Tracheostomy   Cognition Arousal/Alertness: Awake/alert Behavior During Therapy: Flat affect Overall Cognitive Status: Difficult to assess                                 General Comments: indicates lives with family, was independent,   General Comments  VSS            Home Living Family/patient expects to be discharged to:: Private residence Living Arrangements: Children;Other relatives Available Help  at Discharge: Family Type of Home: House Home Access: Stairs to enter [employer] of Steps: 3 Entrance Stairs-Rails: Right Home Layout:  One level               Home Equipment: [place] - 2 wheels;Cane - single point   Additional Comments: info obtained from PT eval; pt now with trach      Prior Functioning/Environment Level of Independence: Independent        Comments: per pt, getting around home "somewhat". family assisting as needed.        OT Problem List: Decreased strength;Decreased activity tolerance;Impaired balance (sitting and/or standing);Decreased safety awareness      OT Treatment/Interventions: Self-care/ADL training;Therapeutic exercise;Energy conservation;DME and/or AE instruction;Therapeutic activities;Patient/family education;Balance training    OT Goals(Current goals can be found in the care plan section) Acute Rehab OT Goals Patient Stated Goal: to return home once better OT Goal Formulation: With patient Time For Goal Achievement: 07/24/19 Potential to Achieve Goals: Good  OT Frequency: Min 2X/week           Co-evaluation PT/OT/SLP Co-Evaluation/Treatment: Yes Reason for Co-Treatment: Complexity of the patient's impairments (multi-system involvement);For patient/therapist safety;To address functional/ADL transfers   OT goals addressed during session: ADL's and self-care      AM-PAC OT "6 Clicks" Daily Activity     Outcome Measure Help from another person eating meals?: Total(NPO) Help from another person taking care of personal grooming?: A Little Help from another person toileting, which includes using toliet, bedpan, or urinal?: A Lot Help from another person bathing (including washing, rinsing, drying)?: A Lot Help from another person to put on and taking off regular upper body clothing?: A Little Help from another person to put on and taking off regular lower body clothing?: Total 6 Click Score: 12   End of Session Equipment Utilized During Treatment: Gait belt Nurse Communication: Mobility status  Activity Tolerance: Patient tolerated treatment well Patient left: in  chair;with call bell/phone within reach;with chair alarm set  OT Visit Diagnosis: Unsteadiness on feet (R26.81);Other abnormalities of gait and mobility (R26.89);Muscle weakness (generalized) (M62.81)                Time: 2878-6767 OT Time Calculation (min): 24 min Charges:  OT General Charges $OT Visit: 1 Visit OT Evaluation $OT Eval Moderate Complexity: [place] OT Pager: [place] 07/10/2019, 2:40 PM

## 2019-07-10 NOTE — Progress Notes (Signed)
[name], male   DOB: 04-17-1955, 64 y.o.   MRN: [id number] Request received for possible gastrostomy tube placement in patient.  Latest images were reviewed by Dr. Pascal Lux and anatomy appears amenable to G-tube placement, however there is a [place] shortage of gastrostomy tubes at this time and only pull-through size we have available is 43 [place]. Will need to discuss with IR MD on 5/10 to determine if ok to proceed with this size.

## 2019-07-10 NOTE — Progress Notes (Signed)
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  Went to the operating room on 5/6 for direct laryngoscopy, biopsy, and tracheostomy.  Critical care asked to see for post-hypotension   History of present illness   This is a 64 year old male admitted 5/4 w/ chief complaint of cough, shortness of breath, fatigue, decreased appetite and unintentional weight loss.  On presentation found to have difficulty swallowing with poor p.o. intake and his own saliva.  Admitted with working diagnosis of sepsis secondary to aspiration.  Further diagnostics by CT imaging showed soft tissue in the hypopharynx suspicious for underlying mass a follow-up CT showed hypopharyngeal soft tissue mass, ENT was consulted on 5/5.  A flexible laryngoscopy was performed at bedside.  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  He went to the operating room on 5/6 for direct laryngoscopy, biopsy, and tracheostomy.  Critical care was consulted on 5/6 for postoperative ventilator management  Past Medical History  Chronic tobacco abuse, COPD, Progressive weight loss and cachexia, dysphagia  Significant Hospital Events   5/4 admitted 5/5 seen by ENT as initial evaluation underwent flexible laryngoscopy 5/6 to operating room for direct laryngoscopy and biopsy, and tracheostomy placement pulmonary asked to evaluate postop for hypotension  Consults:  ENT Pulmonary Procedures:  Tracheostomy 5/6  Significant Diagnostic Tests:    Micro Data:  resp panel 5/4: neg  UC 5/4: mult orgs BCx 2 5/6: >>>  Antimicrobials:  Unasyn 5/4>>>  Interim  history/subjective:  Significant bleeding around trach this morning. Minimal bleeding within trach.  Objective   Blood pressure 122/80, pulse 74, temperature (!) 97.3 F (36.3 C), temperature source Oral, resp. rate 16, height 5\' 11"  (1.803 m), weight 42 kg, SpO2 100 %.    FiO2 (%):  [28 %-40 %] 28 %   Intake/Output Summary (Last 24 hours) at 07/10/2019 1300 Last data filed at 07/10/2019 1246 Gross per 24 hour  Intake 3010.7 ml  Output 2150 ml  Net 860.7 ml   Filed Weights   07/07/19 2049 07/09/19 0949  Weight: 42 kg 42 kg    Examination: General: cachectic elderly man in NAD, laying in bed  HENT: [place]/AT, eyes anicteric Lungs: faint rhales, breathing comfortably on TC Cardiovascular: regular rate and rhythm, no murmurs Abdomen: soft, scaphoid, NT Extremities: no c/c/e Neuro: awake and alert, nodding to answer questions with questionable accuracy. Globally weak. GU: clear yellow urine  Resolved Hospital Problem list     Assessment & Plan:   S/p trach for new diagnosis of head and neck cancer  -bx obtained intraoperatively 5/6 Plan -lidocaine with epinephrine injected around trach stoma this morning to limit bleeding -con't to monitor bleeding -holding DVT prophylaxis today due to bleeding; hopefully can restart tomorrow -keep cuff inflated today due to bleeding and blood coughed out of mouth today -likely will be trach dependent; agree with consult by radiation oncology -con't trach collar -appreciate Oncology's input -appreciate ENT's assistance  Post-operative hypotension; now hypertensive More inclined to think that this is mix of residual anesthesia and hypovolemia. No longer meetings SIRS criteria Plan -con't IVF; will need PEG for long-term nutrition -con't to monitor  Copd w/ h/o tobacco abuse Plan -Smoking cessation  -Cont BDs via TC. Will not be able to go home on oral inhalers; needs nebs via TC.  afib w/ RVR -suspect exacerbated by surg Plan -con't  to monitor on tele -avoid AC for now given bleeding  Aspiration PNA Plan -con't unasyn to complete 7 days  H/o ETOH abuse Plan -CIWA -con't to monitor  Dysphagia, Severe protein calorie malnutrition in setting of malignancy w/associated severe deconditioning and loss of muscle mass  Plan -needs PEG tube   Anemia of chronic illness; stable since admission Plan -con't to monitor  Hypokalemia -repleted -con't to monitor   Best practice:  Diet: NPO Pain/Anxiety/Delirium protocol (if indicated): n/a VAP protocol (if indicated): na DVT prophylaxis: scd GI prophylaxis: ppi Glucose control:  Mobility: BR Code Status: full code  Family Communication: per primary  Disposition: ICU for aggressive respiratory care  Labs   CBC: Recent Labs  Lab 07/07/19 1650 07/08/19 0924 07/09/19 0438 07/10/19 0237  WBC 8.4 6.2 8.8 10.0  NEUTROABS 6.3 4.3 6.9 7.6  HGB 12.7* 11.0* 11.1* 11.5*  HCT 38.8* 32.8* 34.8* 34.7*  MCV 87.2 86.1 88.8 85.0  PLT 276 273 297 751    Basic Metabolic Panel: Recent Labs  Lab 07/07/19 1650 07/08/19 0412 07/08/19 0924 07/09/19 0438 07/10/19 0237  NA 138  --  144 145 143  K 4.8  --  3.4* 3.8 3.3*  CL 100  --  107 106 104  CO2 25  --  24 22 27   GLUCOSE 100*  --  64* 47* 115*  BUN 27*  --  19 18 10   CREATININE 0.86  --  0.60* 0.59* 0.51*  CALCIUM 8.9  --  8.8* 8.8* 8.6*  MG  --  1.6* 1.6* 1.9 1.3*  PHOS  --  3.2 3.1 3.7 2.9   GFR: Estimated Creatinine Clearance: 55.4 mL/min (A) (by C-G formula based on SCr of 0.51 mg/dL (L)). Recent Labs  Lab 07/07/19 1650 07/07/19 1850 07/08/19 0924 07/09/19 0438 07/10/19 0237  WBC 8.4  --  6.2 8.8 10.0  LATICACIDVEN 2.5* 1.7  --   --   --     Liver Function Tests: Recent Labs  Lab 07/07/19 1650 07/08/19 0924 07/09/19 0438 07/10/19 0237  AST 18 17 18 19   ALT 11 10 11 10   ALKPHOS 74 65 63 55  BILITOT 0.9 1.1 1.1 0.6  PROT 7.8 6.7 6.6 5.8*  ALBUMIN 3.3* 2.9* 2.9* 2.4*   No results for  input(s): LIPASE, AMYLASE in the last 168 hours. No results for input(s): AMMONIA in the last 168 hours.  ABG No results found for: PHART, PCO2ART, PO2ART, HCO3, TCO2, ACIDBASEDEF, O2SAT   Coagulation Profile: Recent Labs  Lab 07/07/19 1650  INR 1.0    Cardiac Enzymes: No results for input(s): CKTOTAL, CKMB, CKMBINDEX, TROPONINI in the last 168 hours.  HbA1C: No results found for: HGBA1C  CBG: Recent Labs  Lab 07/09/19 1935 07/09/19 2325 07/10/19 0331 07/10/19 0732 07/10/19 1129  GLUCAP 101* 89 83 71 79     This patient is critically ill with multiple organ system failure which requires frequent high complexity decision making, assessment, support, evaluation, and titration of therapies. This was completed through the application of advanced monitoring technologies and extensive interpretation of multiple databases. During this encounter critical care time was devoted to patient care services described in this note for 46 minutes.  Julian Hy, DO 07/10/19 1:17 PM [facility]

## 2019-07-10 NOTE — Progress Notes (Signed)
PROGRESS NOTE    Devon Peterson  [id number] DOB: Sep 26, 1955 DOA: 07/07/2019 PCP: Patient, No Pcp Per  Brief Narrative:  HPI per Dr. Shela Leff on 07/07/19 Devon Peterson is a 64 y.o. male with medical history significant of COPD, polysubstance abuse (alcohol, tobacco, cocaine), hospital admission 04/17/2019-04/19/2019 for sepsis secondary to pneumonia and COPD exacerbation presenting to the ED via EMS for evaluation of cough, shortness of breath, fatigue, and unintentional weight loss.  History provided by patient and his nephew at bedside.  Nephew states patient was admitted to the hospital a month ago for pneumonia and since then has continued to do poorly.  He has difficulty swallowing food and has been spitting up saliva.  He is coughing a lot.  Patient denies fevers, chills, shortness of breath, chest pain, nausea, vomiting, abdominal pain, diarrhea, or dysuria.  Nephew states patient has been smoking a pack of cigarettes daily since a very young age.  He has lost a lot of weight recently. Addendum: 07/07/2019 8:40 PM.  Patient also denied history of hematemesis, hematochezia, or melena.  ED Course: Tachycardic on arrival with heart rate in the 150s, appeared to be sinus rhythm.  Afebrile.  Slightly tachypneic.  Labs showing no leukocytosis.  Initial lactic acid 2.5, repeat after IV fluid pending.  Hemoglobin 12.7, stable compared to labs done in February 2021.  Platelet count normal.  BUN 27, creatinine 0.8.  Albumin 3.3.  LFTs normal.  Blood culture x2 pending.  SARS-CoV-2 PCR test negative.  Influenza panel negative.  UA not suggestive of infection.  Urine culture pending. Chest x-ray showing new left lower lobe airspace opacity concerning for pneumonia. CT angiogram chest negative for PE.  Showing abnormal soft tissue in the hypopharynx suspicious for underlying mass.  Dense bilateral lower lobe consolidation, left greater than right.  Aspiration suspected given findings in the hypopharynx.   Also showing evidence of an enhancing mass within the densely consolidated left lower lobe, metastatic disease or primary neoplasm not excluded.  Marked cachexia.  Patient received ceftriaxone, azithromycin, and 2 L normal saline boluses.  **Interim History He was evaluated by ENT who recommended a tracheostomy.  Tracheostomy was done today and postoperatively he ended up on pressors which were weaned off.  He is also getting a tissue biopsy and medical oncology as well as radiation oncology were consulted for further evaluation.  Postoperatively he had hypotension and went in A. fib with RVR and started on Neo-Synephrine drip has been stopped.  Critical care was consulted for further evaluation and his hypotension is improved.  There will continue IV fluid but patient needs long-term nutrition needs and needs a PEG tube and have called IR but they will reevaluate Monday for a PEG tube given the national shortage of PEG tube.  Assessment & Plan:   Principal Problem:   Aspiration pneumonia ([place]) Active Problems:   Sepsis ([place])   Dysphagia   Severe protein-calorie malnutrition ([place])   Alcohol use   Pressure injury of skin   Lung mass   Neck mass  Sepsis 2/2 to Aspiration Pneumonia -Was Tachycardic and tachypneic on arrival.  No leukocytosis on labs.  -Does have mild lactic acidosis.   -CT showing dense bilateral lower lobe consolidation, left greater than right suspicious for aspiration pneumonia given given findings suspicious for an abnormal soft tissue mass in the hypopharynx.  -SARS-CoV-2 PCR test negative.  Influenza panel negative.   -Sepsis physiology has improved but he is now hypotensive and see below -Currently satting in the  low to mid 90s on room air. -Heart rate has improved after IV fluid boluses by 1 and A. fib with RVR today.   -Continue IV fluid hydration and antibiotic coverage with Unasyn will continue for total 7 days.   -Trend lactate.   -Blood culture x2 showed no  growth today at 3 days -CT Neck showed "Extension of pneumonia into the left upper lobe. Probable small left pleural effusion. Maxillary sinus air-fluid levels, a nonspecific finding that can reflect acute sinusitis in the appropriate setting." -CT Abd and Pelvis showed "Interval worsening of bibasilar airspace consolidation left base worse than right likely due to pneumonia. Stable rim enhancing low-density focus over the left base consolidation which may be due to necrosis, pulmonary abscess or underlying malignancy.   No evidence of metastatic disease within the abdomen. Bilateral streaky cortical low-attenuation over the kidneys which can be seen in pyelonephritis. Recommend clinical correlation.  Aortic Atherosclerosis (ICD10-I70.0). Findings compatible patient's clinical cachexia with significant decreased subcutaneous and mesenteric fat." -Continuous pulse ox, supplemental oxygen if needed. -Patient now has a tracheostomy and will be admitted to the ICU for postoperative care -Obtain SLP Evaluation and MBS; after MBS was done SLP recommending n.p.o. and ice chips as needed after oral care -Continue to Monitor and repeat CXR in AM -PT OT recommending home health PT  Concern for underlying malignancy status post tracheostomy -CT angiogram showing abnormal soft tissue in the hypopharynx suspicious for an underlying mass.  -Also showing evidence of an enhancing mass within the densely consolidated left lower lobe, metastatic disease or primary neoplasm not excluded.   -Patient is severely cachectic.  Endorsing dysphagia and unintentional weight loss.   -Longstanding history of smoking cigarettes. -Ordered CT soft tissue neck with contrast and showed "Suboptimal evaluation due to motion artifact and poor soft tissue resolution in the setting of significant cachexia and probable diffuse edema. Enhancing hypopharyngeal soft tissue in the post cricoid region extending superiorly with effacement of the  piriform sinuses and thickening of the aryepiglottic folds. Malignancy is suspected and direct visual inspection is recommended. Extension of pneumonia into the left upper lobe. Probable small left pleural effusion. Maxillary sinus air-fluid levels, a nonspecific finding that can reflect acute sinusitis in the appropriate setting." -CT of the abdomen pelvis showed "Interval worsening of bibasilar airspace consolidation left base worse than right likely due to pneumonia. Stable rim enhancing low-density focus over the left base consolidation which may be due to necrosis, pulmonary abscess or underlying malignancy.   No evidence of metastatic disease within the abdomen. Bilateral streaky cortical low-attenuation over the kidneys which can be seen in pyelonephritis. Recommend clinical correlation.  Aortic Atherosclerosis (ICD10-I70.0). Findings compatible patient's clinical cachexia with significant decreased subcutaneous and mesenteric fat." -ENT evaluated and recommending a tracheostomy and he is to undergo biopsy -Have notified medical oncology as well as radiation oncology for further evaluation -Medical oncology evaluated and they will discuss the case with radiation oncology to consider neck radiation.  They will arrange follow-up for the left lung lesion and arrange for biopsy of the masslike component persists and they are going to discuss the diagnosis of laryngeal cancer treatment options and they will reevaluate 510 -Radiation oncology also followed up today and they want his nutritional status a little bit better before they discussed radiation treatments -PEG tube to be done on Monday likely -SLP consulted to do PMSV trials  Dysphagia -Keep n.p.o. at this time, SLP done and MBS and recommending NPO -He is now having a tracheostomy  and likely will need a PEG tube placement -IR consulted and he is a good candidate for a PEG tube but likely will be done on Monday  A. fib with RVR, now improved  -Went into A. fib with RVR in the PACU and was given IV metoprolol -Heart rates have improved some but he is hypotensive and had to be placed on a Neo-Synephrine drip -We will not anticoagulate at this time but may need anticoagulation soon -Continue monitor closely and he will be admitted to the ICU for postoperative care -He had some bleeding this morning and will avoid anticoagulation at this time  Hypotension -Postoperative hypotension and likely in the setting of his anesthesia and hypovolemia -He is getting bolused and was placed on a Neo-Synephrine drip  -Critical care to manage for now and I spoke with Dr. Carlis Abbott -Patient was off of pressors by time he arrived to the ICU and will be transferred back to the [facility] service  Severe protein calorie malnutrition/Underweight -Patient has poor oral intake, unintentional weight loss, and severe cachexia.   -Nutrition consult ordered and recommending advancing diet as medically feasible and if appropriate order diet they are recommending Ensure alive p.o. twice daily, Magic cup twice daily with meals, Juven twice daily, and recommending continue to monitor magnesium, potassium and phosphorus for least 3 days as patient is at risk for refeeding syndrome given his alcohol abuse  Generalized weakness/physical deconditioning:  -PT and OT evaluation recommending Home Health PT  Alcohol use disorder -Tachycardic on arrival, heart rate improved after fluid boluses.  No other signs of withdrawal at this time. -Order CIWA protocol; Ativan as needed.  Thiamine, folate, and multivitamin.  Tobacco Use -Patient has been counseled on smoking cessation.   -NicoDerm patch ordered.  History of Cocaine use -Check UDS and was POSITIVE -Will avoid further Beta Blockers   COPD -Stable.   -No wheezing at this time.   -DuoNebs as needed.  Hypoglycemia -In the setting of poor p.o. intake The patient blood sugar dropped to 24 yesterday AM -He was  given D50 and then started on D5 drip and will continue -We will continue D5 drip for now -Continue monitor as his BS have been ranging from 24-109  Hypokalemia  -Patient's potassium this morning was 3.3 -Replete with IV KCl 40 mEq  -Continue to monitor and replete as necessary -Repeat CMP in a.m.  Hypomagnesemia -Patient's magnesium level now 1.3 -Continue to monitor and replete as necessary and replete per ICU protocol -Repeat magnesium level in a.m.  Normocytic Anemia -Patient's hemoglobin/hematocrit is now stable at 11.5/34.7 -Check anemia panel and showed an iron level of 30, U IBC 130, TIBC 150, saturation ratios of 19%, ferritin level 421, folate level 11.5, and vitamin B12 of 281 -Continue to monitor for signs and symptoms of bleeding; currently had a lot of bleeding from his trach site to be injected with lidocaine -Repeat CBC in a.m.  DVT prophylaxis: Enoxaparin 40 mg sq q24h  Code Status: FULL CODE  Family Communication: No family present at bedside  Disposition Plan: Patient is from home now here with aspiration pneumonia and likely malignancy.  He is unsafe to swallow.  Will need ENT evaluation and likely biopsy and further oncology evaluation.  May require PEG tube placement this visit. He is being transferred to the ICU for Postoperative Hypotenstion  Status is: Inpatient  Remains inpatient appropriate because:Persistent severe electrolyte disturbances, IV treatments appropriate due to intensity of illness or inability to take PO and Inpatient level  of care appropriate due to severity of illness   Dispo: The patient is from: Home              Anticipated d/c is to: Home              Anticipated d/c date is: 3 days              Patient currently is not medically stable to d/c.  Consultants:   ENT  PCCM  Medical oncology  Patient oncology  Interventional radiology   Procedures:  CT Neck and CT Abd/Pelvis  Procedure done by Dr. Wilburn Cornelia  1.  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  Tracheostomy  Antimicrobials:  Anti-infectives (From admission, onward)   Start     Dose/Rate Route Frequency Ordered Stop   07/07/19 2200  ampicillin-sulbactam (UNASYN) 1.5 g in sodium chloride 0.9 % 100 mL IVPB     1.5 g 200 mL/hr over 30 Minutes Intravenous Every 6 hours 07/07/19 2051     07/07/19 1930  metroNIDAZOLE (FLAGYL) IVPB 500 mg  Status:  Discontinued     500 mg 100 mL/hr over 60 Minutes Intravenous  Once 07/07/19 1922 07/07/19 2003   07/07/19 1800  cefTRIAXone (ROCEPHIN) 1 g in sodium chloride 0.9 % 100 mL IVPB     1 g 200 mL/hr over 30 Minutes Intravenous  Once 07/07/19 1750 07/07/19 1830   07/07/19 1800  azithromycin (ZITHROMAX) 500 mg in sodium chloride 0.9 % 250 mL IVPB     500 mg 250 mL/hr over 60 Minutes Intravenous  Once 07/07/19 1750 07/07/19 1855     Subjective: Seen and examined at bedside and had his tracheostomy and had some bleeding from it this morning.  States that he is doing okay and states that his pain is okay.  No nausea or vomiting.  Not really able to talk.  No other concerns or points this time.  Objective: Vitals:   07/10/19 1600 07/10/19 1700 07/10/19 1800 07/10/19 1941  BP: (!) 169/82 (!) 131/91 100/68   Pulse:      Resp: 18 15 20    Temp:  (!) 97.5 F (36.4 C)  97.6 F (36.4 C)  TempSrc:  Oral  Oral  SpO2:  100%    Weight:      Height:        Intake/Output Summary (Last 24 hours) at 07/10/2019 2019 Last data filed at 07/10/2019 1805 Gross per 24 hour  Intake 1928.34 ml  Output 1750 ml  Net 178.34 ml   Filed Weights   07/07/19 2049 07/09/19 0949  Weight: 42 kg 42 kg   Examination: Physical Exam:  Constitutional: Patient is extremely thin and cachectic African chronically ill-appearing male who is no acute distress but is having some bleeding from his tracheostomy, NAD and appears calm and comfortable Eyes: Lids and conjunctivae normal, sclerae  anicteric  ENMT: External Ears, Nose appear normal. Grossly normal hearing. Neck: Has a fresh tracheostomy and is bleeding from it somewhat Respiratory: Continues to have diminished to auscultation bilaterally with some rhonchi and coarse breath sounds, no wheezing, rales, rhonchi or crackles. Normal respiratory effort and patient is not tachypenic. No accessory muscle use.  Connected to the ATC Cardiovascular: RRR, no murmurs / rubs / gallops. S1 and S2  auscultated.  Abdomen: Soft, non-tender, non-distended. Bowel sounds positive.  GU: Deferred. Musculoskeletal: No clubbing / cyanosis of digits/nails. No joint deformity upper and lower extremities.  Skin: No rashes, lesions, ulcers very limited skin evaluation does have some bleeding from his trach. No induration; Warm and dry.  Neurologic: CN 2-12 grossly intact but is not able to speak.  Romberg sign cerebellar reflexes not assessed.  Psychiatric: Normal judgment and insight. Alert and oriented x 3. Normal mood and appropriate affect.   Data Reviewed: I have personally reviewed following labs and imaging studies  CBC: Recent Labs  Lab 07/07/19 1650 07/08/19 0924 07/09/19 0438 07/10/19 0237  WBC 8.4 6.2 8.8 10.0  NEUTROABS 6.3 4.3 6.9 7.6  HGB 12.7* 11.0* 11.1* 11.5*  HCT 38.8* 32.8* 34.8* 34.7*  MCV 87.2 86.1 88.8 85.0  PLT 276 273 297 248   Basic Metabolic Panel: Recent Labs  Lab 07/07/19 1650 07/08/19 0412 07/08/19 0924 07/09/19 0438 07/10/19 0237  NA 138  --  144 145 143  K 4.8  --  3.4* 3.8 3.3*  CL 100  --  107 106 104  CO2 25  --  24 22 27   GLUCOSE 100*  --  64* 47* 115*  BUN 27*  --  19 18 10   CREATININE 0.86  --  0.60* 0.59* 0.51*  CALCIUM 8.9  --  8.8* 8.8* 8.6*  MG  --  1.6* 1.6* 1.9 1.3*  PHOS  --  3.2 3.1 3.7 2.9   GFR: Estimated Creatinine Clearance: 55.4 mL/min (A) (by C-G formula based on SCr of 0.51 mg/dL (L)). Liver Function Tests: Recent Labs  Lab 07/07/19 1650 07/08/19 0924 07/09/19 0438  07/10/19 0237  AST 18 17 18 19   ALT 11 10 11 10   ALKPHOS 74 65 63 55  BILITOT 0.9 1.1 1.1 0.6  PROT 7.8 6.7 6.6 5.8*  ALBUMIN 3.3* 2.9* 2.9* 2.4*   No results for input(s): LIPASE, AMYLASE in the last 168 hours. No results for input(s): AMMONIA in the last 168 hours. Coagulation Profile: Recent Labs  Lab 07/07/19 1650  INR 1.0   Cardiac Enzymes: No results for input(s): CKTOTAL, CKMB, CKMBINDEX, TROPONINI in the last 168 hours. BNP (last 3 results) No results for input(s): PROBNP in the last 8760 hours. HbA1C: No results for input(s): HGBA1C in the last 72 hours. CBG: Recent Labs  Lab 07/10/19 0331 07/10/19 0732 07/10/19 1129 07/10/19 1641 07/10/19 1935  GLUCAP 83 71 79 78 105*   Lipid Profile: No results for input(s): CHOL, HDL, LDLCALC, TRIG, CHOLHDL, LDLDIRECT in the last 72 hours. Thyroid Function Tests: No results for input(s): TSH, T4TOTAL, FREET4, T3FREE, THYROIDAB in the last 72 hours. Anemia Panel: Recent Labs    07/09/19 0438  VITAMINB12 281  FOLATE 11.5  FERRITIN 421*  TIBC 160*  IRON 30*  RETICCTPCT 0.9   Sepsis Labs: Recent Labs  Lab 07/07/19 1650 07/07/19 1850  LATICACIDVEN 2.5* 1.7    Recent Results (from the past 240 hour(s))  Blood Culture (routine x 2)     Status: None (Preliminary result)   Collection Time: 07/07/19  4:45 PM   Specimen: BLOOD LEFT HAND  Result Value Ref Range Status   Specimen Description   Final    BLOOD LEFT HAND Performed at [facility] [address], [place], [place] [address]    Special Requests   Final    BOTTLES DRAWN AEROBIC ONLY Blood Culture results may not be optimal due to an inadequate volume  of blood received in culture bottles Performed at [facility], [place] [address], [place], [place] [address]    Culture   Final    NO GROWTH 3 DAYS Performed at [facility], [place] [address], [place], [name] [address]    Report Status PENDING  Incomplete  Blood  Culture (routine x 2)     Status: None (Preliminary result)   Collection Time: 07/07/19  4:46 PM   Specimen: BLOOD  Result Value Ref Range Status   Specimen Description   Final    BLOOD RIGHT ANTECUBITAL Performed at [facility] [address] [place], [place] [address]    Special Requests   Final    BOTTLES DRAWN AEROBIC AND ANAEROBIC Blood Culture results may not be optimal due to an inadequate volume of blood received in culture bottles Performed at [facility] [address], [place], [place] [address]    Culture   Final    NO GROWTH 3 DAYS Performed at [facility], [place] [address], [place], [place] [address]    Report Status PENDING  Incomplete  Respiratory Panel by RT PCR (Flu A&B, Covid) - Nasopharyngeal Swab     Status: None   Collection Time: 07/07/19  5:14 PM   Specimen: Nasopharyngeal Swab  Result Value Ref Range Status   SARS Coronavirus 2 by RT PCR NEGATIVE NEGATIVE Final    Comment: (NOTE) SARS-CoV-2 target nucleic acids are NOT DETECTED. The SARS-CoV-2 RNA is generally detectable in upper respiratoy specimens during the acute phase of infection. The lowest concentration of SARS-CoV-2 viral copies this assay can detect is 131 copies/mL. A negative result does not preclude SARS-Cov-2 infection and should not be used as the sole basis for treatment or other patient management decisions. A negative result may occur with  improper specimen collection/handling, submission of specimen other than nasopharyngeal swab, presence of viral mutation(s) within the areas targeted by this assay, and inadequate number of viral copies (<131 copies/mL). A negative result must be combined with clinical observations, patient history, and epidemiological information. The expected result is Negative. Fact Sheet for Patients:  PinkCheek.be Fact Sheet for Healthcare Providers:   GravelBags.it This test is not yet ap proved or cleared by the [place] FDA and  has been authorized for detection and/or diagnosis of SARS-CoV-2 by FDA under an Emergency Use Authorization (EUA). This EUA will remain  in effect (meaning this test can be used) for the duration of the COVID-19 declaration under Section 564(b)(1) of the Act, 21 U.S.C. section 360bbb-3(b)(1), unless the authorization is terminated or revoked sooner.    Influenza A by PCR NEGATIVE NEGATIVE Final   Influenza B by PCR NEGATIVE NEGATIVE Final    Comment: (NOTE) The Xpert Xpress SARS-CoV-2/FLU/RSV assay is intended as an aid in  the diagnosis of influenza from Nasopharyngeal swab specimens and  should not be used as a sole basis for treatment. Nasal washings and  aspirates are unacceptable for Xpert Xpress SARS-CoV-2/FLU/RSV  testing. Fact Sheet for Patients: PinkCheek.be Fact Sheet for Healthcare Providers: GravelBags.it This test is not yet approved or cleared by the [place] FDA and  has been authorized for detection and/or diagnosis of SARS-CoV-2 by  FDA under an Emergency Use Authorization (EUA). This EUA will remain  in effect (meaning this test can be used) for the duration of the  Covid-19 declaration under Section 564(b)(1) of the Act, 21  U.S.C. section 360bbb-3(b)(1), unless the authorization is  terminated  or revoked. Performed at [facility], [place] [address] [place], [place] [address]   Urine culture     Status: Abnormal   Collection Time: 07/07/19  5:29 PM   Specimen: In/Out Cath Urine  Result Value Ref Range Status   Specimen Description   Final    IN/OUT CATH URINE Performed at [facility] [address], [place], [place] [address]    Special Requests   Final    NONE Performed at [facility], [place] [address] [place], [place]  [address]    Culture MULTIPLE SPECIES PRESENT, SUGGEST RECOLLECTION (A)  Final   Report Status 07/08/2019 FINAL  Final  MRSA PCR Screening     Status: None   Collection Time: 07/09/19  4:35 PM   Specimen: Nasopharyngeal  Result Value Ref Range Status   MRSA by PCR NEGATIVE NEGATIVE Final    Comment:        The GeneXpert MRSA Assay (FDA approved for NASAL specimens only), is one component of a comprehensive MRSA colonization surveillance program. It is not intended to diagnose MRSA infection nor to guide or monitor treatment for MRSA infections. Performed at [facility], [place] [address], [place], [place] [address]      RN Pressure Injury Documentation: Pressure Injury 07/07/19 Sacrum Mid;Upper Stage 2 -  Partial thickness loss of dermis presenting as a shallow open injury with a red, pink wound bed without slough. 3 each small separate open areas (Active)  07/07/19 2107  Location: Sacrum  Location Orientation: Mid;Upper  Staging: Stage 2 -  Partial thickness loss of dermis presenting as a shallow open injury with a red, pink wound bed without slough.  Wound Description (Comments): 3 each small separate open areas  Present on Admission: Yes     Estimated body mass index is 12.91 kg/m as calculated from the following:   Height as of this encounter: 5\' 11"  (1.803 m).   Weight as of this encounter: 42 kg.  Malnutrition Type:  Nutrition Problem: Severe Malnutrition Etiology: chronic illness(COPD)   Malnutrition Characteristics:  Signs/Symptoms: severe fat depletion, severe muscle depletion   Nutrition Interventions:  Interventions: Refer to RD note for recommendations   Radiology Studies: DG CHEST PORT 1 VIEW  Result Date: 07/09/2019 CLINICAL DATA:  Short of breath. EXAM: PORTABLE CHEST 1 VIEW COMPARISON:  07/07/2019 FINDINGS: Normal heart size. Interval complete atelectasis of the left lower lobe with hyperexpansion of the left upper lobe. Progressive  airspace disease within the left upper lobe. Right lung is clear. IMPRESSION: 1. Interval complete atelectasis of the left lower lobe which is concerning for either postobstructive changes due to left hilar adenopathy versus mucous plugging. 2. Progressive airspace disease within the hyperexpanded left lung. Electronically Signed   By: Kerby Moors M.D.   On: 07/09/2019 08:26   Scheduled Meds: . chlorhexidine  15 mL Mouth Rinse BID  . Chlorhexidine Gluconate Cloth  6 each Topical Daily  . lidocaine  5 mL Other Once  . mouth rinse  15 mL Mouth Rinse BID  . mouth rinse  15 mL Mouth Rinse q12n4p  . thiamine  100 mg Intravenous Daily   Continuous Infusions: . ampicillin-sulbactam (UNASYN) IV Stopped (07/10/19 1656)  . dextrose 5% lactated ringers Stopped (07/10/19 1120)    LOS: 3 days   Kerney Elbe, DO [facility] PAGER is on [place]  If 7PM-7AM, please contact night-coverage [email or web address]

## 2019-07-10 NOTE — Progress Notes (Addendum)
HEMATOLOGY-ONCOLOGY PROGRESS NOTE  SUBJECTIVE: More awake and interactive this morning.  Indicates ongoing pain to his tracheostomy site.  He is nonverbal and difficult to obtain review of systems.  PHYSICAL EXAMINATION:  Vitals:   07/10/19 0900 07/10/19 1141  BP: (!) 141/95   Pulse: 74   Resp: 19 16  Temp:    SpO2: 100% 100%   Filed Weights   07/07/19 2049 07/09/19 0949  Weight: 42 kg 42 kg    Intake/Output from previous day: 05/06 0701 - 05/07 0700 In: 3806.6 [I.V.:2641.6; IV Piggyback:1165] Out: 5929 [Urine:1600; Blood:5]  General:  Severe cachexia, temporal muscle wasting noted.      Neck: tracheostomy midline with thick secretions and blood oozing from his tracheostomy site Respiratory: Diminished breath sounds.   Cardiovascular: Regular rate and rhythm, no lower extremity edema GI:  abdomen was soft, flat, nontender, nondistended, without organomegaly.     Skin exam was without echymosis, petichae.   Neuro:  Awake and alert.    LABORATORY DATA:  I have reviewed the data as listed CMP Latest Ref Rng & Units 07/10/2019 07/09/2019 07/08/2019  Glucose 70 - 99 mg/dL 115(H) 47(L) 64(L)  BUN 8 - 23 mg/dL 10 18 19   Creatinine 0.61 - 1.24 mg/dL 0.51(L) 0.59(L) 0.60(L)  Sodium 135 - 145 mmol/L 143 145 144  Potassium 3.5 - 5.1 mmol/L 3.3(L) 3.8 3.4(L)  Chloride 98 - 111 mmol/L 104 106 107  CO2 22 - 32 mmol/L 27 22 24   Calcium 8.9 - 10.3 mg/dL 8.6(L) 8.8(L) 8.8(L)  Total Protein 6.5 - 8.1 g/dL 5.8(L) 6.6 6.7  Total Bilirubin 0.3 - 1.2 mg/dL 0.6 1.1 1.1  Alkaline Phos 38 - 126 U/L 55 63 65  AST 15 - 41 U/L 19 18 17   ALT 0 - 44 U/L 10 11 10     Lab Results  Component Value Date   WBC 10.0 07/10/2019   HGB 11.5 (L) 07/10/2019   HCT 34.7 (L) 07/10/2019   MCV 85.0 07/10/2019   PLT 249 07/10/2019   NEUTROABS 7.6 07/10/2019    CT Soft Tissue Neck W Contrast  Result Date: 07/08/2019 CLINICAL DATA:  Neck mass EXAM: CT NECK WITH CONTRAST TECHNIQUE: Multidetector CT imaging of  the neck was performed using the standard protocol following the bolus administration of intravenous contrast. CONTRAST:  32mL OMNIPAQUE IOHEXOL 300 MG/ML  SOLN COMPARISON:  None. FINDINGS: Evaluation is limited by poor soft tissue resolution in the setting significant cachexia and probable diffuse edema. Additionally, motion artifact is present. Pharynx and larynx: There is enhancing soft tissue in the post cricoid region extending superiorly with effacement of the piriform sinuses and thickening of the aryepiglottic folds. Salivary glands: Unremarkable. Thyroid: Unremarkable. Lymph nodes: No definite enlarged lymph nodes. Vascular: Major neck vessels are patent. There is calcified plaque at the right greater than left ICA origins. There may be significant stenosis on the right. Limited intracranial: No abnormal enhancement. Visualized orbits: Unremarkable. Mastoids and visualized paranasal sinuses: Bilateral maxillary sinus air-fluid levels. Visualized mastoid air cells are clear. Skeleton: Multilevel degenerative changes of the cervical spine. Upper chest: Emphysema. New extension of centrilobular/tree-in-bud opacities within the left upper lobe. Probable small left pleural effusion Other: None. IMPRESSION: Suboptimal evaluation due to motion artifact and poor soft tissue resolution in the setting of significant cachexia and probable diffuse edema. Enhancing hypopharyngeal soft tissue in the post cricoid region extending superiorly with effacement of the piriform sinuses and thickening of the aryepiglottic folds. Malignancy is suspected and direct visual inspection is  recommended. Extension of pneumonia into the left upper lobe. Probable small left pleural effusion. Maxillary sinus air-fluid levels, a nonspecific finding that can reflect acute sinusitis in the appropriate setting. Electronically Signed   By: Macy Mis M.D.   On: 07/08/2019 13:45   CT Angio Chest PE W and/or Wo Contrast  Result Date:  07/07/2019 CLINICAL DATA:  Failure to thrive, history of pneumonia, cough EXAM: CT ANGIOGRAPHY CHEST WITH CONTRAST TECHNIQUE: Multidetector CT imaging of the chest was performed using the standard protocol during bolus administration of intravenous contrast. Multiplanar CT image reconstructions and MIPs were obtained to evaluate the vascular anatomy. CONTRAST:  89mL OMNIPAQUE IOHEXOL 350 MG/ML SOLN COMPARISON:  07/07/2019 FINDINGS: Cardiovascular: This is a technically adequate evaluation of the pulmonary vasculature. No filling defects or pulmonary emboli. The heart is unremarkable without pericardial effusion. Mild calcification of the mitral and aortic valves. Mild atherosclerosis of the coronary vasculature. Mediastinum/Nodes: No enlarged mediastinal, hilar, or axillary lymph nodes. Thyroid gland, trachea, and esophagus demonstrate no significant findings. Because of marked thoracic kyphosis and positioning, a significant portion of the hypopharynx is included on this exam. There is asymmetric soft tissue in the supraglottic region, incompletely evaluated on this study. I am suspicious of a mass in the right hypopharynx, reference image 15. Follow-up CT neck with contrast or direct visual inspection is recommended. Lungs/Pleura: There is severe background emphysema. Bilateral lower lobe consolidation is seen, left greater than right. Given the above findings in the hypopharynx, aspiration should be considered in addition to community acquired pneumonia. Within the densely consolidated left lower lobe there is evidence of an enhancing 2.7 cm mass, which could reflect metastatic disease. This is best seen on image 126 of series 4. No effusion or pneumothorax. Upper Abdomen: Patient is markedly cachectic. No gross abnormalities are visualized. Musculoskeletal: No acute or destructive bony lesions. Reconstructed images demonstrate no additional findings. Review of the MIP images confirms the above findings.  IMPRESSION: 1. No evidence of pulmonary embolus. 2. Abnormal soft tissue in the hypopharynx, suspicious for underlying mass. CT neck or direct visual inspection recommended for further evaluation. 3. Dense bilateral lower lobe consolidation, left greater than right. Aspiration is suspected given findings in the hypopharynx. 4. There is evidence of an enhancing mass within the densely consolidated left lower lobe, metastatic disease or primary neoplasm not excluded. 5. Marked cachexia. Electronically Signed   By: Randa Ngo M.D.   On: 07/07/2019 19:12   CT ABDOMEN PELVIS W CONTRAST  Result Date: 07/08/2019 CLINICAL DATA:  Lymphoma staging. EXAM: CT ABDOMEN AND PELVIS WITH CONTRAST TECHNIQUE: Multidetector CT imaging of the abdomen and pelvis was performed using the standard protocol following bolus administration of intravenous contrast. CONTRAST:  91mL OMNIPAQUE IOHEXOL 300 MG/ML  SOLN COMPARISON:  CT chest 07/07/2019 FINDINGS: Lower chest: Lung bases demonstrate interval worsening bibasilar consolidation left much worse than right. Rim enhancing low-density structure within the dense consolidation in the left base without significant change which could represent underlying malignancy versus necrotic infection or pulmonary abscess. Calcification of the mitral valve annulus. Hepatobiliary: Liver, gallbladder and biliary tree are normal. Pancreas: Normal. Spleen: Normal. Adrenals/Urinary Tract: Adrenal glands are unremarkable. Kidneys are normal in size and demonstrate bilateral streaky cortical low-attenuation which can be seen in pyelonephritis. No evidence of hydronephrosis. No definite Peri nephric inflammation or fluid. Ureters are not well visualized. Contrast is present within the bladder from patient's recent chest CT. Stomach/Bowel: Stomach and small bowel are unremarkable. Appendix is not well visualized. Colon is unremarkable.  Moderate fecal retention over the rectum. Vascular/Lymphatic: Mild  calcified plaque over the abdominal aorta which is normal caliber. No definite adenopathy. Reproductive: Normal. Other: Evidence of cachexia with significant decrease mesenteric fat and subcutaneous fat present. Musculoskeletal: Degenerative change of the spine and hips. IMPRESSION: 1. Interval worsening of bibasilar airspace consolidation left base worse than right likely due to pneumonia. Stable rim enhancing low-density focus over the left base consolidation which may be due to necrosis, pulmonary abscess or underlying malignancy. 2.  No evidence of metastatic disease within the abdomen. 3. Bilateral streaky cortical low-attenuation over the kidneys which can be seen in pyelonephritis. Recommend clinical correlation. 4.  Aortic Atherosclerosis (ICD10-I70.0). 5. Findings compatible patient's clinical cachexia with significant decreased subcutaneous and mesenteric fat. Electronically Signed   By: Marin Olp M.D.   On: 07/08/2019 13:03   DG CHEST PORT 1 VIEW  Result Date: 07/09/2019 CLINICAL DATA:  Short of breath. EXAM: PORTABLE CHEST 1 VIEW COMPARISON:  07/07/2019 FINDINGS: Normal heart size. Interval complete atelectasis of the left lower lobe with hyperexpansion of the left upper lobe. Progressive airspace disease within the left upper lobe. Right lung is clear. IMPRESSION: 1. Interval complete atelectasis of the left lower lobe which is concerning for either postobstructive changes due to left hilar adenopathy versus mucous plugging. 2. Progressive airspace disease within the hyperexpanded left lung. Electronically Signed   By: Kerby Moors M.D.   On: 07/09/2019 08:26   DG Chest Port 1 View  Result Date: 07/07/2019 CLINICAL DATA:  Cough. EXAM: PORTABLE CHEST 1 VIEW COMPARISON:  04/18/2019 FINDINGS: There is a new left lower lobe airspace opacity concerning for pneumonia. The heart size is stable. Aortic calcifications are noted. The lungs are hyperexpanded. There is no pneumothorax. There is no acute  osseous abnormality. IMPRESSION: 1. New left lower lobe airspace opacity concerning for pneumonia. Follow-up to radiologic resolution is recommended. 2. COPD. Electronically Signed   By: Constance Holster M.D.   On: 07/07/2019 17:18   DG Swallowing Func-Speech Pathology  Result Date: 07/08/2019 Objective Swallowing Evaluation: Type of Study: MBS-Modified Barium Swallow Study  Patient Details Name: Tullio Chausse MRN: [id number] Date of Birth: 02/28/1956 Today's Date: 07/08/2019 Time: SLP Start Time (ACUTE ONLY): 0630 -SLP Stop Time (ACUTE ONLY): 1320 SLP Time Calculation (min) (ACUTE ONLY): 25 min Past Medical History: Past Medical History: Diagnosis Date . Medical history non-contributory  Past Surgical History: Past Surgical History: Procedure Laterality Date . NO PAST SURGERIES   HPI: 64 yo with progressive dysphagia, appears cachetic, found to have likely aspiration pna per CT - enhancing mass LLL- suspected aspiration.  . Imaging studies also concerning for soft tissue mass in hypopharynx. Pt has been a consumer of ETOH - beer and smokes cigarettes.   CT neck today showed "Enhancing hypopharyngeal soft tissue in the post cricoid region extending superiorly with effacement of the piriform sinuses andthickening of the aryepiglottic folds."  Pt desired to eat/drink and swallow evaluation had been ordered. SLP advised an MBS to view impact of mass on pharyngeal swallow.  Subjective: pt awake in chair Assessment / Plan / Recommendation CHL IP CLINICAL IMPRESSIONS 07/08/2019 Clinical Impression Pt presents with gross obstructive based dysphagia secondary to his "Enhancing hypopharyngeal soft tissue in the post cricoid region extending superiorly with effacement of the piriform sinuses and thickening of the aryepiglottic folds"per CT imaging.  Gross pharyngeal-cervical esophageal retention mixed with secretions was aspirated post=swallow as residuals spilled into an open airway. Pt consumption of small cup bolus resulted  in  improved muscular contraction and thus clearance into esophagus.  Recommend pt be npo except single ice chips after oral care.  Pt was informed to clinical reasoning for recommendations but immediately asked if he could eat - clearly demonstrating compromised understanding. SLP Visit Diagnosis Dysphagia, oropharyngeal phase (R13.12);Dysphagia, pharyngoesophageal phase (R13.14) Attention and concentration deficit following -- Frontal lobe and executive function deficit following -- Impact on safety and function Severe aspiration risk;Risk for inadequate nutrition/hydration   CHL IP TREATMENT RECOMMENDATION 07/08/2019 Treatment Recommendations Therapy as outlined in treatment plan below   Prognosis 07/08/2019 Prognosis for Safe Diet Advancement Guarded Barriers to Reach Goals -- Barriers/Prognosis Comment severity of dysphagia due to pt's mass CHL IP DIET RECOMMENDATION 07/08/2019 SLP Diet Recommendations NPO;Ice chips PRN after oral care Liquid Administration via -- Medication Administration Via alternative means Compensations (No Data) Postural Changes --   CHL IP OTHER RECOMMENDATIONS 07/08/2019 Recommended Consults -- Oral Care Recommendations Oral care QID Other Recommendations --   CHL IP FOLLOW UP RECOMMENDATIONS 07/08/2019 Follow up Recommendations (No Data)   CHL IP FREQUENCY AND DURATION 07/08/2019 Speech Therapy Frequency (ACUTE ONLY) min 2x/week Treatment Duration 2 weeks      CHL IP ORAL PHASE 07/08/2019 Oral Phase Impaired Oral - Pudding Teaspoon -- Oral - Pudding Cup -- Oral - Honey Teaspoon -- Oral - Honey Cup -- Oral - Nectar Teaspoon Reduced posterior propulsion;Weak lingual manipulation;Delayed oral transit;Other (Comment) Oral - Nectar Cup Reduced posterior propulsion;Weak lingual manipulation;Delayed oral transit;Other (Comment);Lingual/palatal residue Oral - Nectar Straw -- Oral - Thin Teaspoon Weak lingual manipulation;Delayed oral transit;Other (Comment);Premature spillage Oral - Thin Cup Delayed oral  transit;Weak lingual manipulation;Reduced posterior propulsion;Other (Comment);Lingual/palatal residue Oral - Thin Straw -- Oral - Puree -- Oral - Mech Soft -- Oral - Regular -- Oral - Multi-Consistency -- Oral - Pill -- Oral Phase - Comment lingual rocking observed across all boluses  CHL IP PHARYNGEAL PHASE 07/08/2019 Pharyngeal Phase Impaired Pharyngeal- Pudding Teaspoon -- Pharyngeal -- Pharyngeal- Pudding Cup -- Pharyngeal -- Pharyngeal- Honey Teaspoon -- Pharyngeal -- Pharyngeal- Honey Cup -- Pharyngeal -- Pharyngeal- Nectar Teaspoon Pharyngeal residue - pyriform;Pharyngeal residue - posterior pharnyx;Pharyngeal residue - cp segment;Penetration/Apiration after swallow;Moderate aspiration;Lateral channel residue;Inter-arytenoid space residue;Reduced airway/laryngeal closure;Reduced epiglottic inversion;Reduced anterior laryngeal mobility;Reduced laryngeal elevation Pharyngeal Material enters airway, passes BELOW cords and not ejected out despite cough attempt by patient;Material enters airway, passes BELOW cords without attempt by patient to eject out (silent aspiration);Material enters airway, passes BELOW cords then ejected out Pharyngeal- Nectar Cup Penetration/Apiration after swallow;Pharyngeal residue - pyriform;Pharyngeal residue - posterior pharnyx;Pharyngeal residue - cp segment;Inter-arytenoid space residue;Lateral channel residue;Reduced airway/laryngeal closure;Reduced epiglottic inversion;Reduced laryngeal elevation;Reduced anterior laryngeal mobility Pharyngeal Material enters airway, passes BELOW cords without attempt by patient to eject out (silent aspiration) Pharyngeal- Nectar Straw -- Pharyngeal -- Pharyngeal- Thin Teaspoon Penetration/Apiration after swallow;Pharyngeal residue - pyriform;Pharyngeal residue - posterior pharnyx;Pharyngeal residue - cp segment;Inter-arytenoid space residue;Lateral channel residue;Reduced tongue base retraction;Reduced epiglottic inversion;Penetration/Aspiration  before swallow;Penetration/Aspiration during swallow;Moderate aspiration;Reduced airway/laryngeal closure;Reduced laryngeal elevation;Reduced anterior laryngeal mobility Pharyngeal Material enters airway, passes BELOW cords without attempt by patient to eject out (silent aspiration);Material enters airway, passes BELOW cords and not ejected out despite cough attempt by patient Pharyngeal- Thin Cup Pharyngeal residue - pyriform;Pharyngeal residue - posterior pharnyx;Pharyngeal residue - valleculae;Pharyngeal residue - cp segment;Inter-arytenoid space residue;Lateral channel residue;Reduced airway/laryngeal closure;Reduced epiglottic inversion;Reduced laryngeal elevation;Reduced anterior laryngeal mobility Pharyngeal Material enters airway, passes BELOW cords without attempt by patient to eject out (silent aspiration);Material enters airway, passes BELOW cords and not ejected out despite  cough attempt by patient Pharyngeal- Thin Straw -- Pharyngeal -- Pharyngeal- Puree -- Pharyngeal -- Pharyngeal- Mechanical Soft -- Pharyngeal -- Pharyngeal- Regular -- Pharyngeal -- Pharyngeal- Multi-consistency -- Pharyngeal -- Pharyngeal- Pill -- Pharyngeal -- Pharyngeal Comment --  CHL IP CERVICAL ESOPHAGEAL PHASE 07/08/2019 Cervical Esophageal Phase Impaired Pudding Teaspoon -- Pudding Cup -- Honey Teaspoon -- Honey Cup -- Nectar Teaspoon -- Nectar Cup -- Nectar Straw -- Thin Teaspoon -- Thin Cup -- Thin Straw -- Puree -- Mechanical Soft -- Regular -- Multi-consistency -- Pill -- Cervical Esophageal Comment Pt with minimal clearance of barium into esophagus due to his obstructive mass which results in gross retention of barium mixed with secretions with aspiration post=swallow. Kathleen Lime, MS [facility] SLP Acute Rehab Services Office [phone] [name] 07/08/2019, 2:47 PM               ASSESSMENT AND PLAN: 1.  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  Left lower lobe of the lung mass concerning for primary neoplasm versus metastatic disease             -07/07/2019 CT angiogram of the chest 2.7 cm mass in the left lower lobe of the lung 3.  Cachexia 4.  Aspiration pneumonia 5.  Mild anemia 6.  Atrial fibrillation with RVR 6.  COPD 8.  Alcohol abuse 9.  Tobacco dependence 10.  Cocaine abuse  Mr. Mccarthy is more alert this morning.  He appears stable.  Still having discomfort from his tracheostomy and oozing some blood.  Pathology is pending.  Recommendations: 1.  Await pathology results. 2.  Recommend radiation oncology consult for consideration of definitive radiation. 3.  Will follow up on the lung mass and determine further work-up at a future date. 4.  Recommend PEG tube placement.   LOS: 3 days   Devon Bussing, DNP, AGPCNP-BC, AOCNP 07/10/19 Mr. Brull is alert and communicative this morning.  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  We also discussed the possibility of a primary left lung cancer versus a metastasis.  He continues to recover from surgery and is undergoing evaluation by speech pathology.  He will be seen by radiation oncology to consider neck radiation.  We will follow the left lung lesion and arrange for a biopsy if the masslike component persists.  I will check on him 07/13/2019.  Please call oncology over the weekend as needed.

## 2019-07-10 NOTE — Progress Notes (Signed)
  Speech Language Pathology Treatment: Dysphagia  Patient Details Name: Devon Peterson MRN: [id number] DOB: 1955/03/17 Today's Date: 07/10/2019 Time: 0263-7858 SLP Time Calculation (min) (ACUTE ONLY): 30 min  Assessment / Plan / Recommendation Clinical Impression  Order obtained for PMSV trial, however per RT, pt with copious blood tinged secretions and she advised against trying valve today.  Noted bloody secretions around trach site.  Pt has air in his cuff at the time of the visit.  Reviewed MBS with pt showing him the flouro loops of a normal swallow study compared to his.  Informed pt of importance to continue oral care QID.  Advised pt be able to consume a few single ice chips after mouth care.  Minimal ice chip consumption may aid in decreasing disuse muscle atrophy and aid pt's QOL.  Given great articulation skills also recommend pt be allowed to swish and expectorate with water.  Lingual press to hard palate initiated to improve submental muscle contraction implementing exercise prior to radiation treatment for generalization when treatment/dysphagia worsens.  Pt able to perform exercise with mod I and obvious submental muscular contraction.  Swallow and exercise signs posted in room above pt's bed and within his view.  SLP will follow up next week for PMSV trials.  Pt is making progress in understanding his diagnosis and swallowing precautions.  Thank you.    HPI HPI: 64 yo with progressive dysphagia, appears cachetic, found to have likely aspiration pna per CT - enhancing mass LLL- suspected aspiration.  . Imaging studies also concerning for soft tissue mass in hypopharynx. Pt has been a consumer of ETOH - beer and smokes cigarettes.   CT neck today showed "Enhancing hypopharyngeal soft tissue in the post cricoid region extending superiorly with effacement of the piriform sinuses andthickening of the aryepiglottic folds."  Pt desired to eat/drink and swallow evaluation had been ordered. SLP advised  an MBS to view impact of mass on pharyngeal swallow.              [name] 07/10/2019, 6:42 PM  Kathleen Lime, [place] Office [phone]

## 2019-07-10 NOTE — Progress Notes (Signed)
K 3.3, Mg 1.3 Electrolytes replaced per [facility] electrolyte replacement protocol

## 2019-07-10 NOTE — Progress Notes (Signed)
Pharmacy Antibiotic Note  Devon Peterson is a 64 y.o. male admitted on 07/07/2019 with aspiration pneumonia. Pharmacy has been consulted for Unasyn dosing.  Day #4 Unasyn - Afebrile - WBC 10 - SCr 0.51, CrCl ~55 ml/min - Cultures neg to date  Plan: Continue Unasyn 1.5g IV q6h Monitor renal function, cultures, clinical course.   Height: 5\' 11"  (180.3 cm) Weight: 42 kg (92 lb 9.5 oz) IBW/kg (Calculated) : 75.3  Temp (24hrs), Avg:97.2 F (36.2 C), Min:96.7 F (35.9 C), Max:97.8 F (36.6 C)  Recent Labs  Lab 07/07/19 1650 07/07/19 1850 07/08/19 0924 07/09/19 0438 07/10/19 0237  WBC 8.4  --  6.2 8.8 10.0  CREATININE 0.86  --  0.60* 0.59* 0.51*  LATICACIDVEN 2.5* 1.7  --   --   --     Estimated Creatinine Clearance: 55.4 mL/min (A) (by C-G formula based on SCr of 0.51 mg/dL (L)).    No Known Allergies  Antimicrobials this admission: 5/4 Ceftriaxone x 1 5/4 Azithromycin x 1 5/4 Unasyn >>  Dose adjustments this admission: --  Microbiology results: 5/4 BCx: ngtd 5/4 UCx: multiple species (F) 5/4 COVID: negative  5/4 Influenza A/B: negative   Thank you for allowing pharmacy to be a part of this patient's care.  Peggyann Juba, PharmD, BCPS Pharmacy: [phone] 07/10/2019 8:12 AM

## 2019-07-10 NOTE — TOC Progression Note (Signed)
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  Not a surgical candidate.  Trach collar at 28-40%fi02 desats to 87% at 28%. Iv unasyn, iv d5lr at 75cc/hrs, mgso4 x1 bolus, iv kcl runs x6-K+=3.3.   Expected Discharge Plan: Home/Self Care Barriers to Discharge: No Barriers Identified  Expected Discharge Plan and Services Expected Discharge Plan: Home/Self Care                                               Social Determinants of Health (SDOH) Interventions    Readmission Risk Interventions No flowsheet data found.

## 2019-07-11 ENCOUNTER — Inpatient Hospital Stay (HOSPITAL_COMMUNITY): Payer: Medicaid Other

## 2019-07-11 DIAGNOSIS — J690 Pneumonitis due to inhalation of food and vomit: Secondary | ICD-10-CM | POA: Diagnosis not present

## 2019-07-11 DIAGNOSIS — C139 Malignant neoplasm of hypopharynx, unspecified: Secondary | ICD-10-CM | POA: Insufficient documentation

## 2019-07-11 DIAGNOSIS — R918 Other nonspecific abnormal finding of lung field: Secondary | ICD-10-CM | POA: Diagnosis not present

## 2019-07-11 DIAGNOSIS — E43 Unspecified severe protein-calorie malnutrition: Secondary | ICD-10-CM | POA: Diagnosis not present

## 2019-07-11 LAB — CBC WITH DIFFERENTIAL/PLATELET
Abs Immature Granulocytes: 0.02 10*3/uL (ref 0.00–0.07)
Basophils Absolute: 0 10*3/uL (ref 0.0–0.1)
Basophils Relative: 0 %
Eosinophils Absolute: 0 10*3/uL (ref 0.0–0.5)
Eosinophils Relative: 0 %
HCT: 34.3 % — ABNORMAL LOW (ref 39.0–52.0)
Hemoglobin: 11.3 g/dL — ABNORMAL LOW (ref 13.0–17.0)
Immature Granulocytes: 0 %
Lymphocytes Relative: 30 %
Lymphs Abs: 1.7 10*3/uL (ref 0.7–4.0)
MCH: 28.5 pg (ref 26.0–34.0)
MCHC: 32.9 g/dL (ref 30.0–36.0)
MCV: 86.4 fL (ref 80.0–100.0)
Monocytes Absolute: 0.6 10*3/uL (ref 0.1–1.0)
Monocytes Relative: 10 %
Neutro Abs: 3.5 10*3/uL (ref 1.7–7.7)
Neutrophils Relative %: 60 %
Platelets: 212 10*3/uL (ref 150–400)
RBC: 3.97 MIL/uL — ABNORMAL LOW (ref 4.22–5.81)
RDW: 12.7 % (ref 11.5–15.5)
WBC: 5.9 10*3/uL (ref 4.0–10.5)
nRBC: 0 % (ref 0.0–0.2)

## 2019-07-11 LAB — COMPREHENSIVE METABOLIC PANEL
ALT: 9 U/L (ref 0–44)
AST: 17 U/L (ref 15–41)
Albumin: 2.4 g/dL — ABNORMAL LOW (ref 3.5–5.0)
Alkaline Phosphatase: 51 U/L (ref 38–126)
Anion gap: 10 (ref 5–15)
BUN: 7 mg/dL — ABNORMAL LOW (ref 8–23)
CO2: 27 mmol/L (ref 22–32)
Calcium: 8.4 mg/dL — ABNORMAL LOW (ref 8.9–10.3)
Chloride: 102 mmol/L (ref 98–111)
Creatinine, Ser: 0.4 mg/dL — ABNORMAL LOW (ref 0.61–1.24)
GFR calc Af Amer: >60 mL/min (ref >60)
GFR calc non Af Amer: >60 mL/min (ref >60)
Glucose, Bld: 85 mg/dL (ref 70–99)
Potassium: 3.3 mmol/L — ABNORMAL LOW (ref 3.5–5.1)
Sodium: 139 mmol/L (ref 135–145)
Total Bilirubin: 0.7 mg/dL (ref 0.3–1.2)
Total Protein: 5.5 g/dL — ABNORMAL LOW (ref 6.5–8.1)

## 2019-07-11 LAB — GLUCOSE, CAPILLARY
Glucose-Capillary: 102 mg/dL — ABNORMAL HIGH (ref 70–99)
Glucose-Capillary: 107 mg/dL — ABNORMAL HIGH (ref 70–99)
Glucose-Capillary: 108 mg/dL — ABNORMAL HIGH (ref 70–99)
Glucose-Capillary: 55 mg/dL — ABNORMAL LOW (ref 70–99)
Glucose-Capillary: 67 mg/dL — ABNORMAL LOW (ref 70–99)
Glucose-Capillary: 85 mg/dL (ref 70–99)
Glucose-Capillary: 87 mg/dL (ref 70–99)
Glucose-Capillary: 91 mg/dL (ref 70–99)

## 2019-07-11 LAB — BASIC METABOLIC PANEL
Anion gap: 13 (ref 5–15)
BUN: 5 mg/dL — ABNORMAL LOW (ref 8–23)
CO2: 27 mmol/L (ref 22–32)
Calcium: 8.5 mg/dL — ABNORMAL LOW (ref 8.9–10.3)
Chloride: 93 mmol/L — ABNORMAL LOW (ref 98–111)
Creatinine, Ser: 0.51 mg/dL — ABNORMAL LOW (ref 0.61–1.24)
GFR calc Af Amer: >60 mL/min (ref >60)
GFR calc non Af Amer: >60 mL/min (ref >60)
Glucose, Bld: 234 mg/dL — ABNORMAL HIGH (ref 70–99)
Potassium: 4.2 mmol/L (ref 3.5–5.1)
Sodium: 133 mmol/L — ABNORMAL LOW (ref 135–145)

## 2019-07-11 LAB — MAGNESIUM: Magnesium: 2.1 mg/dL (ref 1.7–2.4)

## 2019-07-11 LAB — POTASSIUM: Potassium: 3.2 mmol/L — ABNORMAL LOW (ref 3.5–5.1)

## 2019-07-11 LAB — PHOSPHORUS: Phosphorus: 2.4 mg/dL — ABNORMAL LOW (ref 2.5–4.6)

## 2019-07-11 MED ORDER — POTASSIUM CHLORIDE 10 MEQ/100ML IV SOLN: 10.0000 meq | INTRAVENOUS | Status: AC

## 2019-07-11 MED ORDER — DEXTROSE 50 % IV SOLN
INTRAVENOUS | Status: AC
  Administered 2019-07-11: 50 mL
  Filled 2019-07-11: qty 50

## 2019-07-11 MED ORDER — DEXTROSE 50 % IV SOLN
INTRAVENOUS | Status: AC
  Administered 2019-07-11: 12.5 g via INTRAVENOUS
  Filled 2019-07-11: qty 50

## 2019-07-11 MED ORDER — POTASSIUM PHOSPHATES 15 MMOLE/5ML IV SOLN
10.0000 mmol | Freq: Once | INTRAVENOUS | Status: AC
  Administered 2019-07-11: 10 mmol via INTRAVENOUS
  Filled 2019-07-11: qty 3.33

## 2019-07-11 MED ORDER — POTASSIUM CHLORIDE 10 MEQ/100ML IV SOLN
10.0000 meq | INTRAVENOUS | Status: AC
  Administered 2019-07-11 (×2): 10 meq via INTRAVENOUS
  Filled 2019-07-11 (×2): qty 100

## 2019-07-11 MED ORDER — DEXTROSE 10 % IV SOLN: INTRAVENOUS | Status: DC

## 2019-07-11 MED ORDER — SODIUM CHLORIDE 0.9% FLUSH
10.0000 mL | INTRAVENOUS | Status: DC | PRN
  Administered 2019-07-11 – 2019-08-03 (×4): 10 mL

## 2019-07-11 MED ORDER — DEXTROSE 50 % IV SOLN: 12.5000 g | Freq: Once | INTRAVENOUS | Status: AC

## 2019-07-11 MED ORDER — POTASSIUM CHLORIDE CRYS ER 20 MEQ PO TBCR: 40.0000 meq | EXTENDED_RELEASE_TABLET | Freq: Once | ORAL | Status: DC

## 2019-07-11 MED ORDER — POTASSIUM CHLORIDE 10 MEQ/100ML IV SOLN
10.0000 meq | INTRAVENOUS | Status: AC
  Administered 2019-07-11 (×4): 10 meq via INTRAVENOUS
  Filled 2019-07-11 (×4): qty 100

## 2019-07-11 NOTE — Progress Notes (Signed)
CRITICAL VALUE ALERT  Critical Value:  CBG 55  Date & Time Notied:  5/8 0750  Provider Notified: Dr Alfredia Ferguson  Orders Received/Actions taken:4ml D50 given , will recheck

## 2019-07-11 NOTE — Progress Notes (Signed)
PROGRESS NOTE    Devon Peterson  [id number] DOB: [birth year] DOA: 07/07/2019 PCP: Patient, No Pcp Per  Brief Narrative:  HPI per Dr. Shela Leff on 07/07/19 Kenaz Olafson is a 64 y.o. male with medical history significant of COPD, polysubstance abuse (alcohol, tobacco, cocaine), hospital admission 04/17/2019-04/19/2019 for sepsis secondary to pneumonia and COPD exacerbation presenting to the ED via EMS for evaluation of cough, shortness of breath, fatigue, and unintentional weight loss.  History provided by patient and his nephew at bedside.  Nephew states patient was admitted to the hospital a month ago for pneumonia and since then has continued to do poorly.  He has difficulty swallowing food and has been spitting up saliva.  He is coughing a lot.  Patient denies fevers, chills, shortness of breath, chest pain, nausea, vomiting, abdominal pain, diarrhea, or dysuria.  Nephew states patient has been smoking a pack of cigarettes daily since a very young age.  He has lost a lot of weight recently. Addendum: 07/07/2019 8:40 PM.  Patient also denied history of hematemesis, hematochezia, or melena.  ED Course: Tachycardic on arrival with heart rate in the 150s, appeared to be sinus rhythm.  Afebrile.  Slightly tachypneic.  Labs showing no leukocytosis.  Initial lactic acid 2.5, repeat after IV fluid pending.  Hemoglobin 12.7, stable compared to labs done in February 2021.  Platelet count normal.  BUN 27, creatinine 0.8.  Albumin 3.3.  LFTs normal.  Blood culture x2 pending.  SARS-CoV-2 PCR test negative.  Influenza panel negative.  UA not suggestive of infection.  Urine culture pending. Chest x-ray showing new left lower lobe airspace opacity concerning for pneumonia. CT angiogram chest negative for PE.  Showing abnormal soft tissue in the hypopharynx suspicious for underlying mass.  Dense bilateral lower lobe consolidation, left greater than right.  Aspiration suspected given findings in the hypopharynx.   Also showing evidence of an enhancing mass within the densely consolidated left lower lobe, metastatic disease or primary neoplasm not excluded.  Marked cachexia.  Patient received ceftriaxone, azithromycin, and 2 L normal saline boluses.  **Interim History He was evaluated by ENT who recommended a tracheostomy.  Tracheostomy was done today and postoperatively he ended up on pressors which were weaned off.  He is also getting a tissue biopsy and medical oncology as well as radiation oncology were consulted for further evaluation.  Postoperatively he had hypotension and went in A. fib with RVR and started on Neo-Synephrine drip has been stopped.  Critical care was consulted for further evaluation and his hypotension is improved.  There will continue IV fluid but patient needs long-term nutrition needs and needs a PEG tube and have called IR but they will reevaluate Monday for a PEG tube given the national shortage of PEG tube.  Patient's tracheostomy is doing okay and he is having a lot of secretions but not as much blood.  Again radiation oncology wants to have improvement in the patient's performance status as well as nutritional status as well as further evaluation of the lung mass with likely pet imaging as an outpatient and potential biopsy of lung lesion to determine if this is metastatic versus a second primary.  This will help determine radiation oncology if treatment will be of curative or more of a palliative approach.  They will continue to collaborate with medical oncology  Assessment & Plan:   Principal Problem:   Aspiration pneumonia ([facility]) Active Problems:   Sepsis ([place])   Dysphagia   Severe protein-calorie malnutrition ([place])  Alcohol use   Pressure injury of skin   Lung mass   Neck mass  Sepsis 2/2 to Aspiration Pneumonia -Was Tachycardic and tachypneic on arrival.  No leukocytosis on labs.  -Does have mild lactic acidosis.   -CT showing dense bilateral lower lobe  consolidation, left greater than right suspicious for aspiration pneumonia given given findings suspicious for an abnormal soft tissue mass in the hypopharynx.  -SARS-CoV-2 PCR test negative.  Influenza panel negative.   -Sepsis physiology has improved but he is now hypotensive and see below -Currently satting in the low to mid 90s on room air. -Heart rate has improved after IV fluid boluses but post operatively he went into A Fib with RVR and is now improved  -Continue IV fluid hydration and antibiotic coverage with Unasyn will continue for total 7 days.   -Trend lactate.   -Blood culture x2 showed no growth today at 4 days -CT Neck showed "Extension of pneumonia into the left upper lobe. Probable small left pleural effusion. Maxillary sinus air-fluid levels, a nonspecific finding that can reflect acute sinusitis in the appropriate setting." -CT Abd and Pelvis showed "Interval worsening of bibasilar airspace consolidation left base worse than right likely due to pneumonia. Stable rim enhancing low-density focus over the left base consolidation which may be due to necrosis, pulmonary abscess or underlying malignancy.   No evidence of metastatic disease within the abdomen. Bilateral streaky cortical low-attenuation over the kidneys which can be seen in pyelonephritis. Recommend clinical correlation.  Aortic Atherosclerosis (ICD10-I70.0). Findings compatible patient's clinical cachexia with significant decreased subcutaneous and mesenteric fat." -Continuous pulse ox, supplemental oxygen if needed. -Patient now has a tracheostomy and will be admitted to the ICU for postoperative care -Obtain SLP Evaluation and MBS; after MBS was done SLP recommending n.p.o. 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  -Also showing evidence of an enhancing mass within the densely consolidated left lower lobe, metastatic disease or primary neoplasm not excluded.   -Patient is severely cachectic.  Endorsing dysphagia and unintentional weight loss.   -Longstanding history of smoking cigarettes. -Ordered CT soft tissue neck with contrast and showed "Suboptimal evaluation due to motion artifact and poor soft tissue resolution in the setting of significant cachexia and probable diffuse edema. Enhancing hypopharyngeal soft tissue in the post cricoid region extending superiorly with effacement of the piriform sinuses and thickening of the aryepiglottic folds. Malignancy is suspected and direct visual inspection is recommended. Extension of pneumonia into the left upper lobe. Probable small left pleural effusion. Maxillary sinus air-fluid levels, a nonspecific finding that can reflect acute sinusitis in the appropriate setting." -CT of the abdomen pelvis showed "Interval worsening of bibasilar airspace consolidation left base worse than right likely due to pneumonia. Stable rim enhancing low-density focus over the left base consolidation which may be due to necrosis, pulmonary abscess or underlying malignancy.   No evidence of metastatic disease within the abdomen. Bilateral streaky cortical low-attenuation over the kidneys which can be seen in pyelonephritis. Recommend clinical correlation.  Aortic Atherosclerosis (ICD10-I70.0). Findings compatible patient's clinical cachexia with significant decreased subcutaneous and mesenteric fat." -ENT evaluated and recommending a tracheostomy  and he is to undergo biopsy; biopsy results have come back now and it shows poorly differentiated squamous cell carcinoma with basaloid features -Have notified medical  oncology as well as radiation oncology for further evaluation; patient will need a PEG tube placement and radiation oncology was planning on determining if they will need palliative versus curative radiation therapy pending on further work-up of the left lower lung mass as well as PET scans and they want the patient to have a better nutritional status and performance status before this is arranged -Medical oncology evaluated and they will discuss the case with radiation oncology to consider neck radiation.  They will arrange follow-up for the left lung lesion and arrange for biopsy of the masslike component persists and they are going to discuss the diagnosis of laryngeal cancer treatment options and they will reevaluate 510 -Radiation oncology also followed up today and they want his nutritional status a little bit better before they discussed radiation treatments as I mentioned above -PEG tube to be done on Monday likely -Pulmonary feels that the patient will likely be trach dependent and they recommend continuing trach collar: They recommend keeping the cuff inflated related to his bleeding and blood coughed out of the mouth yesterday and today -SLP consulted to do PMSV trials -Dr. Wilburn Cornelia is on board and appreciate assistance  Dysphagia -Keep n.p.o. at this time, SLP done and MBS and recommending NPO -He is now having a tracheostomy and likely will need a PEG tube placement -IR consulted and he is a good candidate for a PEG tube but likely will be done on Monday potentially  A. fib with RVR, now improved -Went into A. fib with RVR in the PACU and was given IV metoprolol -Heart rates have improved some but he is hypotensive and had to be placed on a Neo-Synephrine drip -We will not anticoagulate at this time but may need anticoagulation soon -Continue monitor closely and he will be admitted to the ICU for postoperative care -He had some bleeding this morning and will avoid anticoagulation at  this time  Hypotension -Postoperative hypotension and likely in the setting of his anesthesia and hypovolemia -He is getting bolused and was placed on a Neo-Synephrine drip  -Critical care to manage for now and I spoke with Dr. Carlis Abbott -Patient was off of pressors by time he arrived to the ICU and will be transferred back to the [facility] service  Severe protein calorie malnutrition/Underweight -Patient has poor oral intake, unintentional weight loss, and severe cachexia.   -Nutrition consult ordered and recommending advancing diet as medically feasible and if appropriate order diet they are recommending Ensure alive p.o. twice daily, Magic cup twice daily with meals, Juven twice daily, and recommending continue to monitor magnesium, potassium and phosphorus for least 3 days as patient is at risk for refeeding syndrome given his alcohol abuse  Generalized weakness/physical deconditioning:  -PT and OT evaluation recommending Home Health PT  Alcohol use disorder -Tachycardic on arrival, heart rate improved after fluid boluses.  No other signs of withdrawal at this time. -Order CIWA protocol; Ativan as needed.  Thiamine, folate, and multivitamin.  Tobacco Use -Patient has been counseled on smoking cessation.   -NicoDerm patch ordered.  History of Cocaine use -Check UDS and was POSITIVE -Will avoid further Beta Blockers   COPD -Stable.   -No wheezing at this time.   -DuoNebs as needed via the trach collar; will not be able to go home on oral inhalers and needs nebs via trach  collar at discharge  Hypoglycemia -In the setting of poor p.o. intake The patient blood sugar dropped to 24 the day before yesterday -He was given D50 and then started on D5 drip and will continue however continue to be a little low so we will stop the D5 and start the patient on D10 drip at 75 mL's per hour -We will continue D5 drip for now -Continue monitor as his BS have been ranging from 55-108  Hypokalemia    -Patient's potassium this morning was 3.3 -Replete with IV KCl 40 mEq x3 and IV K Phos 10 mmol -Continue to monitor and replete as necessary -Repeat CMP in a.m.  Hypomagnesemia -Patient's magnesium level now 1.3 and is now 2.1 -Continue to monitor and replete as necessary and replete per ICU protocol -Repeat magnesium level in a.m.  Hypophosphatemia -Patient's Phos Level was 2.4 -Replete with IV K Phos 10 mmol -Continue to Monitor and Replete as Necessary -Repeat Phos Level in the AM   Normocytic Anemia -Patient's hemoglobin/hematocrit is now stable and slightly dropped from yesterday today and is 11.3/34.3 -Check anemia panel and showed an iron level of 30, U IBC 130, TIBC 150, saturation ratios of 19%, ferritin level 421, folate level 11.5, and vitamin B12 of 281 -Continue to monitor for signs and symptoms of bleeding; currently had a lot of bleeding from his trach site to be injected with lidocaine -Repeat CBC in a.m.  Stage II sacral pressure ulcer -Present on admission -Continue with Mepilex pads and frequent turning  DVT prophylaxis: Enoxaparin 40 mg sq q24h  Code Status: FULL CODE  Family Communication: No family present at bedside  Disposition Plan: Patient is from home now here with aspiration pneumonia and likely malignancy.  He is unsafe to swallow.  Will need ENT evaluation and likely biopsy and further oncology evaluation.  May require PEG tube placement this visit. He is being transferred to the ICU for Postoperative Hypotenstion continues to have significant oral secretions so will remain in ICU today  Status is: Inpatient  Remains inpatient appropriate because:Persistent severe electrolyte disturbances, IV treatments appropriate due to intensity of illness or inability to take PO and Inpatient level of care appropriate due to severity of illness   Dispo: The patient is from: Home              Anticipated d/c is to: Home              Anticipated d/c date is: 3  days              Patient currently is not medically stable to d/c.  Consultants:   ENT  PCCM  Medical oncology  Patient oncology  Interventional radiology   Procedures:  CT Neck and CT Abd/Pelvis  Procedure done by Dr. Wilburn Cornelia  1.  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  Tracheostomy  Antimicrobials:  Anti-infectives (From admission, onward)   Start     Dose/Rate Route Frequency Ordered Stop   07/07/19 2200  ampicillin-sulbactam (UNASYN) 1.5 g in sodium chloride 0.9 % 100 mL IVPB     1.5 g 200 mL/hr over 30 Minutes Intravenous Every 6 hours 07/07/19 2051 07/14/19 2159   07/07/19 1930  metroNIDAZOLE (FLAGYL) IVPB 500 mg  Status:  Discontinued  500 mg 100 mL/hr over 60 Minutes Intravenous  Once 07/07/19 1922 07/07/19 2003   07/07/19 1800  cefTRIAXone (ROCEPHIN) 1 g in sodium chloride 0.9 % 100 mL IVPB     1 g 200 mL/hr over 30 Minutes Intravenous  Once 07/07/19 1750 07/07/19 1830   07/07/19 1800  azithromycin (ZITHROMAX) 500 mg in sodium chloride 0.9 % 250 mL IVPB     500 mg 250 mL/hr over 60 Minutes Intravenous  Once 07/07/19 1750 07/07/19 1855     Subjective: Seen and examined at bedside and he is nonverbal due to tracheostomy status but continues to have significant amount of secretions from his tracheostomy.  No nausea or vomiting.  Having pink-tinged secretions.  No active blood like yesterday.  Feels okay denies any pain.  Wanting to eat and a little disappointed but understands that he needs a PEG tube.  No other concerns or points this time.  Objective: Vitals:   07/11/19 1400 07/11/19 1500 07/11/19 1516 07/11/19 1600  BP: (!) 154/95 (!) 147/94  (!) 153/91  Pulse:      Resp: 20 (!) 21 (!) 23 20  Temp:      TempSrc:      SpO2:   98% 100%  Weight:      Height:        Intake/Output Summary (Last 24 hours) at 07/11/2019 1631 Last data filed at 07/11/2019 1600 Gross per 24 hour  Intake 2105.68 ml    Output 1400 ml  Net 705.68 ml   Filed Weights   07/07/19 2049 07/09/19 0949  Weight: 42 kg 42 kg   Examination: Physical Exam:  Constitutional: The patient is extremely thin and cachectic chronically ill-appearing African-American male who is currently in no acute distress and the bleeding stopped from tracheostomy but he still having a lot of secretions.  He appears calm laying there., NAD and appears calm and comfortable Eyes: Lids and conjunctivae normal, sclerae anicteric  ENMT: External Ears, Nose appear normal. Grossly normal hearing.  Neck: Appears normal, supple, no cervical masses, normal ROM, no appreciable thyromegaly; no JVD but he does have a tracheostomy in place connected to ATC and he does have some pinkish secretions around it Respiratory: Diminished to auscultation bilaterally with coarse breath sounds and some rhonchi more so on the left compared to the right but no appreciable wheezing, crackles.,  He has unlabored breathing and is connected to the ATC Cardiovascular: RRR, no murmurs / rubs / gallops. S1 and S2 auscultated. No extremity edema.  Abdomen: Soft, non-tender, non-distended. Bowel sounds positive.  GU: Deferred. Musculoskeletal: No clubbing / cyanosis of digits/nails. No joint deformity upper and lower extremities.  Skin: No rashes, lesions, ulcers on limited skin evaluation but he does have a stage II sacral ulcer that I did not view.  The bleeding from his tracheostomy is improved and he has some minimal pinkish secretions.. No induration; Warm and dry.  Neurologic: CN 2-12 grossly intact with no focal deficits but is not able to speak secondary is new trach. Romberg sign and cerebellar reflexes not assessed.  Psychiatric: Normal judgment and insight. Alert and oriented x 3. Normal mood and appropriate affect.   Data Reviewed: I have personally reviewed following labs and imaging studies  CBC: Recent Labs  Lab 07/07/19 1650 07/08/19 0924 07/09/19 0438  07/10/19 0237 07/11/19 0238  WBC 8.4 6.2 8.8 10.0 5.9  NEUTROABS 6.3 4.3 6.9 7.6 3.5  HGB 12.7* 11.0* 11.1* 11.5* 11.3*  HCT 38.8* 32.8* 34.8* 34.7* 34.3*  MCV 87.2 86.1 88.8 85.0 86.4  PLT 276 273 297 249 979   Basic Metabolic Panel: Recent Labs  Lab  0000 07/07/19 1650 07/08/19 0412 07/08/19 0412 07/08/19 0924 07/09/19 0438 07/10/19 0237 07/10/19 1958 07/11/19 0238 07/11/19 0841  NA  --  138  --   --  144 145 143  --  139  --   K   < > 4.8  --   --  3.4* 3.8 3.3*  --  3.3* 3.2*  CL  --  100  --   --  107 106 104  --  102  --   CO2  --  25  --   --  24 22 27   --  27  --   GLUCOSE  --  100*  --   --  64* 47* 115*  --  85  --   BUN  --  27*  --   --  19 18 10   --  7*  --   CREATININE  --  0.86  --   --  0.60* 0.59* 0.51*  --  0.40*  --   CALCIUM  --  8.9  --   --  8.8* 8.8* 8.6*  --  8.4*  --   MG  --   --  1.6*   < > 1.6* 1.9 1.3* 2.3 2.1  --   PHOS  --   --  3.2  --  3.1 3.7 2.9  --  2.4*  --    < > = values in this interval not displayed.   GFR: Estimated Creatinine Clearance: 55.4 mL/min (A) (by C-G formula based on SCr of 0.4 mg/dL (L)). Liver Function Tests: Recent Labs  Lab 07/07/19 1650 07/08/19 0924 07/09/19 0438 07/10/19 0237 07/11/19 0238  AST 18 17 18 19 17   ALT 11 10 11 10 9   ALKPHOS 74 65 63 55 51  BILITOT 0.9 1.1 1.1 0.6 0.7  PROT 7.8 6.7 6.6 5.8* 5.5*  ALBUMIN 3.3* 2.9* 2.9* 2.4* 2.4*   No results for input(s): LIPASE, AMYLASE in the last 168 hours. No results for input(s): AMMONIA in the last 168 hours. Coagulation Profile: Recent Labs  Lab 07/07/19 1650  INR 1.0   Cardiac Enzymes: No results for input(s): CKTOTAL, CKMB, CKMBINDEX, TROPONINI in the last 168 hours. BNP (last 3 results) No results for input(s): PROBNP in the last 8760 hours. HbA1C: No results for input(s): HGBA1C in the last 72 hours. CBG: Recent Labs  Lab 07/11/19 0456 07/11/19 0739 07/11/19 0831 07/11/19 1137 07/11/19 1546  GLUCAP 107* 55* 85 87 108*   Lipid  Profile: No results for input(s): CHOL, HDL, LDLCALC, TRIG, CHOLHDL, LDLDIRECT in the last 72 hours. Thyroid Function Tests: No results for input(s): TSH, T4TOTAL, FREET4, T3FREE, THYROIDAB in the last 72 hours. Anemia Panel: Recent Labs    07/09/19 0438  VITAMINB12 281  FOLATE 11.5  FERRITIN 421*  TIBC 160*  IRON 30*  RETICCTPCT 0.9   Sepsis Labs: Recent Labs  Lab 07/07/19 1650 07/07/19 1850  LATICACIDVEN 2.5* 1.7    Recent Results (from the past 240 hour(s))  Blood Culture (routine x 2)     Status: None (Preliminary result)   Collection Time: 07/07/19  4:45 PM   Specimen: BLOOD LEFT HAND  Result Value Ref Range Status   Specimen Description   Final    BLOOD LEFT HAND Performed at [facility] [address], [place], [place] [address]  Special Requests   Final    BOTTLES DRAWN AEROBIC ONLY Blood Culture results may not be optimal due to an inadequate volume of blood received in culture bottles Performed at [facility] [address], [place], [place] [address]    Culture   Final    NO GROWTH 4 DAYS Performed at [facility], [place] [address] [place], [place] [address]    Report Status PENDING  Incomplete  Blood Culture (routine x 2)     Status: None (Preliminary result)   Collection Time: 07/07/19  4:46 PM   Specimen: BLOOD  Result Value Ref Range Status   Specimen Description   Final    BLOOD RIGHT ANTECUBITAL Performed at [facility] [address], [place], [place] [address]    Special Requests   Final    BOTTLES DRAWN AEROBIC AND ANAEROBIC Blood Culture results may not be optimal due to an inadequate volume of blood received in culture bottles Performed at [facility] [address] [place], [place] [address]    Culture   Final    NO GROWTH 4 DAYS Performed at [facility], [place] [address] [place], [place] [address]    Report Status PENDING  Incomplete    Respiratory Panel by RT PCR (Flu A&B, Covid) - Nasopharyngeal Swab     Status: None   Collection Time: 07/07/19  5:14 PM   Specimen: Nasopharyngeal Swab  Result Value Ref Range Status   SARS Coronavirus 2 by RT PCR NEGATIVE NEGATIVE Final    Comment: (NOTE) SARS-CoV-2 target nucleic acids are NOT DETECTED. The SARS-CoV-2 RNA is generally detectable in upper respiratoy specimens during the acute phase of infection. The lowest concentration of SARS-CoV-2 viral copies this assay can detect is 131 copies/mL. A negative result does not preclude SARS-Cov-2 infection and should not be used as the sole basis for treatment or other patient management decisions. A negative result may occur with  improper specimen collection/handling, submission of specimen other than nasopharyngeal swab, presence of viral mutation(s) within the areas targeted by this assay, and inadequate number of viral copies (<131 copies/mL). A negative result must be combined with clinical observations, patient history, and epidemiological information. The expected result is Negative. Fact Sheet for Patients:  PinkCheek.be Fact Sheet for Healthcare Providers:  GravelBags.it This test is not yet ap proved or cleared by the [place] FDA and  has been authorized for detection and/or diagnosis of SARS-CoV-2 by FDA under an Emergency Use Authorization (EUA). This EUA will remain  in effect (meaning this test can be used) for the duration of the COVID-19 declaration under Section 564(b)(1) of the Act, 21 U.S.C. section 360bbb-3(b)(1), unless the authorization is terminated or revoked sooner.    Influenza A by PCR NEGATIVE NEGATIVE Final   Influenza B by PCR NEGATIVE NEGATIVE Final    Comment: (NOTE) The Xpert Xpress SARS-CoV-2/FLU/RSV assay is intended as an aid in  the diagnosis of influenza from Nasopharyngeal swab specimens and  should not be used as a sole  basis for treatment. Nasal washings and  aspirates are unacceptable for Xpert Xpress SARS-CoV-2/FLU/RSV  testing. Fact Sheet for Patients: PinkCheek.be Fact Sheet for Healthcare Providers: GravelBags.it This test is not yet approved or cleared by the [place] FDA and  has been authorized for detection and/or diagnosis of SARS-CoV-2 by  FDA under an Emergency Use Authorization (EUA). This EUA will remain  in effect (meaning this test can be used)  for the duration of the  Covid-19 declaration under Section 564(b)(1) of the Act, 21  U.S.C. section 360bbb-3(b)(1), unless the authorization is  terminated or revoked. Performed at [facility], [place] [address], [place], [place] [address]   Urine culture     Status: Abnormal   Collection Time: 07/07/19  5:29 PM   Specimen: In/Out Cath Urine  Result Value Ref Range Status   Specimen Description   Final    IN/OUT CATH URINE Performed at [facility] [address], [place], [place] [address]    Special Requests   Final    NONE Performed at [facility], [place] [address], [place], [place] [address]    Culture MULTIPLE SPECIES PRESENT, SUGGEST RECOLLECTION (A)  Final   Report Status 07/08/2019 FINAL  Final  MRSA PCR Screening     Status: None   Collection Time: 07/09/19  4:35 PM   Specimen: Nasopharyngeal  Result Value Ref Range Status   MRSA by PCR NEGATIVE NEGATIVE Final    Comment:        The GeneXpert MRSA Assay (FDA approved for NASAL specimens only), is one component of a comprehensive MRSA colonization surveillance program. It is not intended to diagnose MRSA infection nor to guide or monitor treatment for MRSA infections. Performed at [facility], [place] [address] [place], [place] [address]      RN Pressure Injury Documentation: Pressure Injury 07/07/19 Sacrum Mid;Upper Stage 2 -   Partial thickness loss of dermis presenting as a shallow open injury with a red, pink wound bed without slough. 3 each small separate open areas (Active)  07/07/19 2107  Location: Sacrum  Location Orientation: Mid;Upper  Staging: Stage 2 -  Partial thickness loss of dermis presenting as a shallow open injury with a red, pink wound bed without slough.  Wound Description (Comments): 3 each small separate open areas  Present on Admission: Yes     Estimated body mass index is 12.91 kg/m as calculated from the following:   Height as of this encounter: 5\' 11"  (1.803 m).   Weight as of this encounter: 42 kg.  Malnutrition Type:  Nutrition Problem: Severe Malnutrition Etiology: chronic illness(COPD)   Malnutrition Characteristics:  Signs/Symptoms: severe fat depletion, severe muscle depletion   Nutrition Interventions:  Interventions: Refer to RD note for recommendations   Radiology Studies: DG CHEST PORT 1 VIEW  Result Date: 07/11/2019 CLINICAL DATA:  Short of breath EXAM: PORTABLE CHEST 1 VIEW COMPARISON:  CT 07/07/2019, radiograph 07/09/2019 FINDINGS: Tracheostomy tube unchanged.  Normal cardiac silhouette. There is bibasilar airspace disease. Airspace disease is dense at the LEFT lung base. Findings correspond to a dense lower lobe pneumonia seen on comparison CT. Upper lobes are clear. IMPRESSION: No change in dense LEFT lower lobe pneumonia and mild RIGHT lower lobe pneumonia. Electronically Signed   By: Suzy Bouchard M.D.   On: 07/11/2019 08:01   Scheduled Meds: . chlorhexidine  15 mL Mouth Rinse BID  . Chlorhexidine Gluconate Cloth  6 each Topical Daily  . mouth rinse  15 mL Mouth Rinse BID  . mouth rinse  15 mL Mouth Rinse q12n4p  . thiamine  100 mg Intravenous Daily   Continuous Infusions: . ampicillin-sulbactam (UNASYN) IV Stopped (07/11/19 1231)  . dextrose 75 mL/hr at 07/11/19 1600  . potassium chloride    . potassium PHOSPHATE IVPB (in mmol) 42 mL/hr at 07/11/19  1600    LOS: 4 days   [facility],  DO [facility] PAGER is on AMION  If 7PM-7AM, please contact night-coverage [email or web address]

## 2019-07-11 NOTE — Progress Notes (Signed)
PROGRESS NOTE    Devon Peterson  [id number] DOB: 02/19/1956 DOA: 07/07/2019 PCP: Patient, No Pcp Per  Brief Narrative:  HPI per Dr. Shela Leff on 07/07/19 Devon Peterson is a 64 y.o. male with medical history significant of COPD, polysubstance abuse (alcohol, tobacco, cocaine), hospital admission 04/17/2019-04/19/2019 for sepsis secondary to pneumonia and COPD exacerbation presenting to the ED via EMS for evaluation of cough, shortness of breath, fatigue, and unintentional weight loss.  History provided by patient and his nephew at bedside.  Nephew states patient was admitted to the hospital a month ago for pneumonia and since then has continued to do poorly.  He has difficulty swallowing food and has been spitting up saliva.  He is coughing a lot.  Patient denies fevers, chills, shortness of breath, chest pain, nausea, vomiting, abdominal pain, diarrhea, or dysuria.  Nephew states patient has been smoking a pack of cigarettes daily since a very young age.  He has lost a lot of weight recently. Addendum: 07/07/2019 8:40 PM.  Patient also denied history of hematemesis, hematochezia, or melena.  ED Course: Tachycardic on arrival with heart rate in the 150s, appeared to be sinus rhythm.  Afebrile.  Slightly tachypneic.  Labs showing no leukocytosis.  Initial lactic acid 2.5, repeat after IV fluid pending.  Hemoglobin 12.7, stable compared to labs done in February 2021.  Platelet count normal.  BUN 27, creatinine 0.8.  Albumin 3.3.  LFTs normal.  Blood culture x2 pending.  SARS-CoV-2 PCR test negative.  Influenza panel negative.  UA not suggestive of infection.  Urine culture pending. Chest x-ray showing new left lower lobe airspace opacity concerning for pneumonia. CT angiogram chest negative for PE.  Showing abnormal soft tissue in the hypopharynx suspicious for underlying mass.  Dense bilateral lower lobe consolidation, left greater than right.  Aspiration suspected given findings in the hypopharynx.   Also showing evidence of an enhancing mass within the densely consolidated left lower lobe, metastatic disease or primary neoplasm not excluded.  Marked cachexia.  Patient received ceftriaxone, azithromycin, and 2 L normal saline boluses.  **Interim History He was evaluated by ENT who recommended a tracheostomy.  Tracheostomy was done today and postoperatively he ended up on pressors which were weaned off.  He is also getting a tissue biopsy and medical oncology as well as radiation oncology were consulted for further evaluation.  Postoperatively he had hypotension and went in A. fib with RVR and started on Neo-Synephrine drip has been stopped.  Critical care was consulted for further evaluation and his hypotension is improved.  There will continue IV fluid but patient needs long-term nutrition needs and needs a PEG tube and have called IR but they will reevaluate Monday for a PEG tube given the national shortage of PEG tube.  Patient's tracheostomy is doing okay and he is having a lot of secretions but not as much blood.  Again radiation oncology wants to have improvement in the patient's performance status as well as nutritional status as well as further evaluation of the lung mass with likely pet imaging as an outpatient and potential biopsy of lung lesion to determine if this is metastatic versus a second primary.  This will help determine radiation oncology if treatment will be of curative or more of a palliative approach.  They will continue to collaborate with medical oncology  Assessment & Plan:   Principal Problem:   Aspiration pneumonia ([place]) Active Problems:   Sepsis ([place])   Dysphagia   Severe protein-calorie malnutrition ([place])  Alcohol use   Pressure injury of skin   Lung mass   Neck mass  Sepsis 2/2 to Aspiration Pneumonia -Was Tachycardic and tachypneic on arrival.  No leukocytosis on labs.  -Does have mild lactic acidosis.   -CT showing dense bilateral lower lobe  consolidation, left greater than right suspicious for aspiration pneumonia given given findings suspicious for an abnormal soft tissue mass in the hypopharynx.  -SARS-CoV-2 PCR test negative.  Influenza panel negative.   -Sepsis physiology has improved but he is now hypotensive and see below -Currently satting in the low to mid 90s on room air. -Heart rate has improved after IV fluid boluses but post operatively he went into A Fib with RVR and is now improved  -Continue IV fluid hydration and antibiotic coverage with Unasyn will continue for total 7 days.   -Trend lactate.   -Blood culture x2 showed no growth today at 4 days -CT Neck showed "Extension of pneumonia into the left upper lobe. Probable small left pleural effusion. Maxillary sinus air-fluid levels, a nonspecific finding that can reflect acute sinusitis in the appropriate setting." -CT Abd and Pelvis showed "Interval worsening of bibasilar airspace consolidation left base worse than right likely due to pneumonia. Stable rim enhancing low-density focus over the left base consolidation which may be due to necrosis, pulmonary abscess or underlying malignancy.   No evidence of metastatic disease within the abdomen. Bilateral streaky cortical low-attenuation over the kidneys which can be seen in pyelonephritis. Recommend clinical correlation.  Aortic Atherosclerosis (ICD10-I70.0). Findings compatible patient's clinical cachexia with significant decreased subcutaneous and mesenteric fat." -Continuous pulse ox, supplemental oxygen if needed. -Patient now has a tracheostomy and will be admitted to the ICU for postoperative care -Obtain SLP Evaluation and MBS; after MBS was done SLP recommending n.p.o. 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  -Also showing evidence of an enhancing mass within the densely consolidated left lower lobe, metastatic disease or primary neoplasm not excluded.   -Patient is severely cachectic.  Endorsing dysphagia and unintentional weight loss.   -Longstanding history of smoking cigarettes. -Ordered CT soft tissue neck with contrast and showed "Suboptimal evaluation due to motion artifact and poor soft tissue resolution in the setting of significant cachexia and probable diffuse edema. Enhancing hypopharyngeal soft tissue in the post cricoid region extending superiorly with effacement of the piriform sinuses and thickening of the aryepiglottic folds. Malignancy is suspected and direct visual inspection is recommended. Extension of pneumonia into the left upper lobe. Probable small left pleural effusion. Maxillary sinus air-fluid levels, a nonspecific finding that can reflect acute sinusitis in the appropriate setting." -CT of the abdomen pelvis showed "Interval worsening of bibasilar airspace consolidation left base worse than right likely due to pneumonia. Stable rim enhancing low-density focus over the left base consolidation which may be due to necrosis, pulmonary abscess or underlying malignancy.   No evidence of metastatic disease within the abdomen. Bilateral streaky cortical low-attenuation over the kidneys which can be seen in pyelonephritis. Recommend clinical correlation.  Aortic Atherosclerosis (ICD10-I70.0). Findings compatible patient's clinical cachexia with significant decreased subcutaneous and mesenteric fat." -ENT evaluated and recommending a tracheostomy and he is to undergo biopsy; biopsy results have come back now and it shows poorly differentiated  squamous cell carcinoma with basaloid features -Have notified medical oncology as well as radiation oncology for further evaluation; patient will need a PEG tube placement and  radiation oncology was planning on determining if they will need palliative versus curative radiation therapy pending on further work-up of the left lower lung mass as well as PET scans and they want the patient to have a better nutritional status and performance status before this is arranged -Medical oncology evaluated and they will discuss the case with radiation oncology to consider neck radiation.  They will arrange follow-up for the left lung lesion and arrange for biopsy of the masslike component persists and they are going to discuss the diagnosis of laryngeal cancer treatment options and they will reevaluate 510 -Radiation oncology also followed up today and they want his nutritional status a little bit better before they discussed radiation treatments as I mentioned above -PEG tube to be done on Monday likely -Pulmonary feels that the patient will likely be trach dependent and they recommend continuing trach collar: They recommend keeping the cuff inflated related to his bleeding and blood coughed out of the mouth yesterday and today -SLP consulted to do PMSV trials -Dr. Wilburn Cornelia is on board and appreciate assistance  Dysphagia -Keep n.p.o. at this time, SLP done and MBS and recommending NPO -He is now having a tracheostomy and likely will need a PEG tube placement -IR consulted and he is a good candidate for a PEG tube but likely will be done on Monday potentially  A. fib with RVR, now improved -Went into A. fib with RVR in the PACU and was given IV metoprolol -Heart rates have improved some but he is hypotensive and had to be placed on a Neo-Synephrine drip -We will not anticoagulate at this time but may need anticoagulation soon -Continue monitor closely and he will be admitted to the ICU for postoperative care -He had some bleeding this morning and will avoid anticoagulation at this time  Hypotension -Postoperative hypotension and likely in the setting of his anesthesia and  hypovolemia -He is getting bolused and was placed on a Neo-Synephrine drip  -Critical care to manage for now and I spoke with Dr. Carlis Abbott -Patient was off of pressors by time he arrived to the ICU and will be transferred back to the [facility] service  Severe protein calorie malnutrition/Underweight -Patient has poor oral intake, unintentional weight loss, and severe cachexia.   -Nutrition consult ordered and recommending advancing diet as medically feasible and if appropriate order diet they are recommending Ensure alive p.o. twice daily, Magic cup twice daily with meals, Juven twice daily, and recommending continue to monitor magnesium, potassium and phosphorus for least 3 days as patient is at risk for refeeding syndrome given his alcohol abuse  Generalized weakness/physical deconditioning:  -PT and OT evaluation recommending Home Health PT  Alcohol use disorder -Tachycardic on arrival, heart rate improved after fluid boluses.  No other signs of withdrawal at this time. -Order CIWA protocol; Ativan as needed.  Thiamine, folate, and multivitamin.  Tobacco Use -Patient has been counseled on smoking cessation.   -NicoDerm patch ordered.  History of Cocaine use -Check UDS and was POSITIVE -Will avoid further Beta Blockers   COPD -Stable.   -No wheezing at this time.   -DuoNebs as needed via the trach collar; will not be able to go home on oral inhalers and needs nebs via trach collar at discharge  Hypoglycemia -In the setting of poor p.o. intake The patient blood sugar dropped  to 24 the day before yesterday -He was given D50 and then started on D5 drip and will continue however continue to be a little low so we will stop the D5 and start the patient on D10 drip at 75 mL's per hour -We will continue D5 drip for now -Continue monitor as his BS have been ranging from 55-108  Hypokalemia  -Patient's potassium this morning was 3.3 -Replete with IV KCl 40 mEq x3 and IV K Phos 10 mmol  -Continue to monitor and replete as necessary -Repeat CMP in a.m.  Hypomagnesemia -Patient's magnesium level now 1.3 and is now 2.1 -Continue to monitor and replete as necessary and replete per ICU protocol -Repeat magnesium level in a.m.  Hypophosphatemia -Patient's Phos Level was 2.4 -Replete with IV K Phos 10 mmol -Continue to Monitor and Replete as Necessary -Repeat Phos Level in the AM   Normocytic Anemia -Patient's hemoglobin/hematocrit is now stable and slightly dropped from yesterday today and is 11.3/34.3 -Check anemia panel and showed an iron level of 30, U IBC 130, TIBC 150, saturation ratios of 19%, ferritin level 421, folate level 11.5, and vitamin B12 of 281 -Continue to monitor for signs and symptoms of bleeding; currently had a lot of bleeding from his trach site to be injected with lidocaine -Repeat CBC in a.m.  Stage II sacral pressure ulcer -Present on admission -Continue with Mepilex pads and frequent turning  DVT prophylaxis: Enoxaparin 40 mg sq q24h  Code Status: FULL CODE  Family Communication: No family present at bedside  Disposition Plan: Patient is from home now here with aspiration pneumonia and likely malignancy.  He is unsafe to swallow.  Will need ENT evaluation and likely biopsy and further oncology evaluation.  May require PEG tube placement this visit. He is being transferred to the ICU for Postoperative Hypotenstion continues to have significant oral secretions so will remain in ICU today  Status is: Inpatient  Remains inpatient appropriate because:Persistent severe electrolyte disturbances, IV treatments appropriate due to intensity of illness or inability to take PO and Inpatient level of care appropriate due to severity of illness   Dispo: The patient is from: Home              Anticipated d/c is to: Home              Anticipated d/c date is: 3 days              Patient currently is not medically stable to d/c.  Consultants:   ENT   PCCM  Medical oncology  Patient oncology  Interventional radiology   Procedures:  CT Neck and CT Abd/Pelvis  Procedure done by Dr. Wilburn Cornelia  1.  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  Tracheostomy  Antimicrobials:  Anti-infectives (From admission, onward)   Start     Dose/Rate Route Frequency Ordered Stop   07/07/19 2200  ampicillin-sulbactam (UNASYN) 1.5 g in sodium chloride 0.9 % 100 mL IVPB     1.5 g 200 mL/hr over 30 Minutes Intravenous Every 6 hours 07/07/19 2051 07/14/19 2159   07/07/19 1930  metroNIDAZOLE (FLAGYL) IVPB 500 mg  Status:  Discontinued     500 mg 100 mL/hr over 60 Minutes Intravenous  Once 07/07/19 1922 07/07/19 2003   07/07/19 1800  cefTRIAXone (ROCEPHIN) 1 g in sodium chloride 0.9 % 100 mL IVPB     1 g 200 mL/hr over 30 Minutes Intravenous  Once 07/07/19 1750 07/07/19 1830   07/07/19 1800  azithromycin (ZITHROMAX) 500 mg in sodium chloride 0.9 % 250 mL IVPB     500 mg 250 mL/hr over 60 Minutes Intravenous  Once 07/07/19 1750 07/07/19 1855     Subjective: Seen and examined at bedside and he is nonverbal due to tracheostomy status but continues to have significant amount of secretions from his tracheostomy.  No nausea or vomiting.  Having pink-tinged secretions.  No active blood like yesterday.  Feels okay denies any pain.  Wanting to eat and a little disappointed but understands that he needs a PEG tube.  No other concerns or points this time.  Objective: Vitals:   07/11/19 1300 07/11/19 1400 07/11/19 1500 07/11/19 1516  BP: (!) 150/91 (!) 154/95 (!) 147/94   Pulse:      Resp: 16 20 (!) 21 (!) 23  Temp:      TempSrc:      SpO2:    98%  Weight:      Height:        Intake/Output Summary (Last 24 hours) at 07/11/2019 1559 Last data filed at 07/11/2019 1500 Gross per 24 hour  Intake 1988.52 ml  Output 1400 ml  Net 588.52 ml   Filed Weights   07/07/19 2049 07/09/19 0949  Weight: 42 kg 42 kg    Examination: Physical Exam:  Constitutional: The patient is extremely thin and cachectic chronically ill-appearing African-American male who is currently in no acute distress and the bleeding stopped from tracheostomy but he still having a lot of secretions.  He appears calm laying there., NAD and appears calm and comfortable Eyes: Lids and conjunctivae normal, sclerae anicteric  ENMT: External Ears, Nose appear normal. Grossly normal hearing.  Neck: Appears normal, supple, no cervical masses, normal ROM, no appreciable thyromegaly; no JVD but he does have a tracheostomy in place connected to ATC and he does have some pinkish secretions around it Respiratory: Diminished to auscultation bilaterally with coarse breath sounds and some rhonchi more so on the left compared to the right but no appreciable wheezing, crackles.,  He has unlabored breathing and is connected to the ATC Cardiovascular: RRR, no murmurs / rubs / gallops. S1 and S2 auscultated. No extremity edema.  Abdomen: Soft, non-tender, non-distended. Bowel sounds positive.  GU: Deferred. Musculoskeletal: No clubbing / cyanosis of digits/nails. No joint deformity upper and lower extremities.  Skin: No rashes, lesions, ulcers on limited skin evaluation but he does have a stage II sacral ulcer that I did not view.  The bleeding from his tracheostomy is improved and he has some minimal pinkish secretions.. No induration; Warm and dry.  Neurologic: CN 2-12 grossly intact with no focal deficits but is not able to speak secondary is new trach. Romberg sign and cerebellar reflexes not assessed.  Psychiatric: Normal judgment and insight. Alert and oriented x 3. Normal mood and appropriate affect.   Data Reviewed: I have personally reviewed following labs and imaging studies  CBC: Recent Labs  Lab 07/07/19 1650 07/08/19 0924 07/09/19 0438 07/10/19 0237 07/11/19 0238  WBC 8.4 6.2 8.8 10.0 5.9  NEUTROABS 6.3 4.3 6.9 7.6 3.5  HGB 12.7*  11.0* 11.1* 11.5* 11.3*  HCT 38.8* 32.8* 34.8* 34.7* 34.3*  MCV 87.2 86.1 88.8 85.0 86.4  PLT 276 273 297 249 924   Basic Metabolic Panel: Recent  Labs  Lab  0000 07/07/19 1650 07/08/19 0412 07/08/19 0412 07/08/19 0924 07/09/19 0438 07/10/19 0237 07/10/19 1958 07/11/19 0238 07/11/19 0841  NA  --  138  --   --  144 145 143  --  139  --   K   < > 4.8  --   --  3.4* 3.8 3.3*  --  3.3* 3.2*  CL  --  100  --   --  107 106 104  --  102  --   CO2  --  25  --   --  24 22 27   --  27  --   GLUCOSE  --  100*  --   --  64* 47* 115*  --  85  --   BUN  --  27*  --   --  19 18 10   --  7*  --   CREATININE  --  0.86  --   --  0.60* 0.59* 0.51*  --  0.40*  --   CALCIUM  --  8.9  --   --  8.8* 8.8* 8.6*  --  8.4*  --   MG  --   --  1.6*   < > 1.6* 1.9 1.3* 2.3 2.1  --   PHOS  --   --  3.2  --  3.1 3.7 2.9  --  2.4*  --    < > = values in this interval not displayed.   GFR: Estimated Creatinine Clearance: 55.4 mL/min (A) (by C-G formula based on SCr of 0.4 mg/dL (L)). Liver Function Tests: Recent Labs  Lab 07/07/19 1650 07/08/19 0924 07/09/19 0438 07/10/19 0237 07/11/19 0238  AST 18 17 18 19 17   ALT 11 10 11 10 9   ALKPHOS 74 65 63 55 51  BILITOT 0.9 1.1 1.1 0.6 0.7  PROT 7.8 6.7 6.6 5.8* 5.5*  ALBUMIN 3.3* 2.9* 2.9* 2.4* 2.4*   No results for input(s): LIPASE, AMYLASE in the last 168 hours. No results for input(s): AMMONIA in the last 168 hours. Coagulation Profile: Recent Labs  Lab 07/07/19 1650  INR 1.0   Cardiac Enzymes: No results for input(s): CKTOTAL, CKMB, CKMBINDEX, TROPONINI in the last 168 hours. BNP (last 3 results) No results for input(s): PROBNP in the last 8760 hours. HbA1C: No results for input(s): HGBA1C in the last 72 hours. CBG: Recent Labs  Lab 07/11/19 0456 07/11/19 0739 07/11/19 0831 07/11/19 1137 07/11/19 1546  GLUCAP 107* 55* 85 87 108*   Lipid Profile: No results for input(s): CHOL, HDL, LDLCALC, TRIG, CHOLHDL, LDLDIRECT in the last 72 hours.  Thyroid Function Tests: No results for input(s): TSH, T4TOTAL, FREET4, T3FREE, THYROIDAB in the last 72 hours. Anemia Panel: Recent Labs    07/09/19 0438  VITAMINB12 281  FOLATE 11.5  FERRITIN 421*  TIBC 160*  IRON 30*  RETICCTPCT 0.9   Sepsis Labs: Recent Labs  Lab 07/07/19 1650 07/07/19 1850  LATICACIDVEN 2.5* 1.7    Recent Results (from the past 240 hour(s))  Blood Culture (routine x 2)     Status: None (Preliminary result)   Collection Time: 07/07/19  4:45 PM   Specimen: BLOOD LEFT HAND  Result Value Ref Range Status   Specimen Description   Final    BLOOD LEFT HAND Performed at [facility] [address], [place], [place] [address]    Special Requests   Final    BOTTLES DRAWN AEROBIC ONLY Blood Culture results may not be optimal  due to an inadequate volume of blood received in culture bottles Performed at [facility] [address] [place], [place] [address]    Culture   Final    NO GROWTH 4 DAYS Performed at [facility], [place] [address] [place], [place] [address]    Report Status PENDING  Incomplete  Blood Culture (routine x 2)     Status: None (Preliminary result)   Collection Time: 07/07/19  4:46 PM   Specimen: BLOOD  Result Value Ref Range Status   Specimen Description   Final    BLOOD RIGHT ANTECUBITAL Performed at [facility] [address], [place], [place] [address]    Special Requests   Final    BOTTLES DRAWN AEROBIC AND ANAEROBIC Blood Culture results may not be optimal due to an inadequate volume of blood received in culture bottles Performed at [facility] [address], [place], [place] [address]    Culture   Final    NO GROWTH 4 DAYS Performed at [facility], [place] [address] [place], [place] [address]    Report Status PENDING  Incomplete  Respiratory Panel by RT PCR (Flu A&B, Covid) - Nasopharyngeal Swab     Status: None   Collection Time:  07/07/19  5:14 PM   Specimen: Nasopharyngeal Swab  Result Value Ref Range Status   SARS Coronavirus 2 by RT PCR NEGATIVE NEGATIVE Final    Comment: (NOTE) SARS-CoV-2 target nucleic acids are NOT DETECTED. The SARS-CoV-2 RNA is generally detectable in upper respiratoy specimens during the acute phase of infection. The lowest concentration of SARS-CoV-2 viral copies this assay can detect is 131 copies/mL. A negative result does not preclude SARS-Cov-2 infection and should not be used as the sole basis for treatment or other patient management decisions. A negative result may occur with  improper specimen collection/handling, submission of specimen other than nasopharyngeal swab, presence of viral mutation(s) within the areas targeted by this assay, and inadequate number of viral copies (<131 copies/mL). A negative result must be combined with clinical observations, patient history, and epidemiological information. The expected result is Negative. Fact Sheet for Patients:  PinkCheek.be Fact Sheet for Healthcare Providers:  GravelBags.it This test is not yet ap proved or cleared by the [place] FDA and  has been authorized for detection and/or diagnosis of SARS-CoV-2 by FDA under an Emergency Use Authorization (EUA). This EUA will remain  in effect (meaning this test can be used) for the duration of the COVID-19 declaration under Section 564(b)(1) of the Act, 21 U.S.C. section 360bbb-3(b)(1), unless the authorization is terminated or revoked sooner.    Influenza A by PCR NEGATIVE NEGATIVE Final   Influenza B by PCR NEGATIVE NEGATIVE Final    Comment: (NOTE) The Xpert Xpress SARS-CoV-2/FLU/RSV assay is intended as an aid in  the diagnosis of influenza from Nasopharyngeal swab specimens and  should not be used as a sole basis for treatment. Nasal washings and  aspirates are unacceptable for Xpert Xpress SARS-CoV-2/FLU/RSV   testing. Fact Sheet for Patients: PinkCheek.be Fact Sheet for Healthcare Providers: GravelBags.it This test is not yet approved or cleared by the [place] FDA and  has been authorized for detection and/or diagnosis of SARS-CoV-2 by  FDA under an Emergency Use Authorization (EUA). This EUA will remain  in effect (meaning this test can be used) for the duration of the  Covid-19 declaration under Section 564(b)(1) of the Act, 21  U.S.C. section 360bbb-3(b)(1), unless  the authorization is  terminated or revoked. Performed at [facility], [place] [address] [place], [place] [address]   Urine culture     Status: Abnormal   Collection Time: 07/07/19  5:29 PM   Specimen: In/Out Cath Urine  Result Value Ref Range Status   Specimen Description   Final    IN/OUT CATH URINE Performed at [facility] [address] [place], [place] [address]    Special Requests   Final    NONE Performed at [facility], [place] [address], [place], [place] [address]    Culture MULTIPLE SPECIES PRESENT, SUGGEST RECOLLECTION (A)  Final   Report Status 07/08/2019 FINAL  Final  MRSA PCR Screening     Status: None   Collection Time: 07/09/19  4:35 PM   Specimen: Nasopharyngeal  Result Value Ref Range Status   MRSA by PCR NEGATIVE NEGATIVE Final    Comment:        The GeneXpert MRSA Assay (FDA approved for NASAL specimens only), is one component of a comprehensive MRSA colonization surveillance program. It is not intended to diagnose MRSA infection nor to guide or monitor treatment for MRSA infections. Performed at [facility], [place] [address] [place],  [address]      RN Pressure Injury Documentation: Pressure Injury 07/07/19 Sacrum Mid;Upper Stage 2 -  Partial thickness loss of dermis presenting as a shallow open injury with a red, pink wound bed without slough.  3 each small separate open areas (Active)  07/07/19 2107  Location: Sacrum  Location Orientation: Mid;Upper  Staging: Stage 2 -  Partial thickness loss of dermis presenting as a shallow open injury with a red, pink wound bed without slough.  Wound Description (Comments): 3 each small separate open areas  Present on Admission: Yes     Estimated body mass index is 12.91 kg/m as calculated from the following:   Height as of this encounter: 5\' 11"  (1.803 m).   Weight as of this encounter: 42 kg.  Malnutrition Type:  Nutrition Problem: Severe Malnutrition Etiology: chronic illness(COPD)   Malnutrition Characteristics:  Signs/Symptoms: severe fat depletion, severe muscle depletion   Nutrition Interventions:  Interventions: Refer to RD note for recommendations   Radiology Studies: DG CHEST PORT 1 VIEW  Result Date: 07/11/2019 CLINICAL DATA:  Short of breath EXAM: PORTABLE CHEST 1 VIEW COMPARISON:  CT 07/07/2019, radiograph 07/09/2019 FINDINGS: Tracheostomy tube unchanged.  Normal cardiac silhouette. There is bibasilar airspace disease. Airspace disease is dense at the LEFT lung base. Findings correspond to a dense lower lobe pneumonia seen on comparison CT. Upper lobes are clear. IMPRESSION: No change in dense LEFT lower lobe pneumonia and mild RIGHT lower lobe pneumonia. Electronically Signed   By: Suzy Bouchard M.D.   On: 07/11/2019 08:01   Scheduled Meds: . chlorhexidine  15 mL Mouth Rinse BID  . Chlorhexidine Gluconate Cloth  6 each Topical Daily  . mouth rinse  15 mL Mouth Rinse BID  . mouth rinse  15 mL Mouth Rinse q12n4p  . thiamine  100 mg Intravenous Daily   Continuous Infusions: . ampicillin-sulbactam (UNASYN) IV Stopped (07/11/19 1231)  . dextrose 75 mL/hr at 07/11/19 1500  . potassium PHOSPHATE IVPB (in mmol) 42 mL/hr at 07/11/19 1500    LOS: 4 days   Kerney Elbe, DO [facility] PAGER is on [place]  If 7PM-7AM, please contact night-coverage  [email or web address]

## 2019-07-11 NOTE — Progress Notes (Signed)
NAME:  Devon Peterson, MRN:  [id number], DOB:  Dec 03, 1955, LOS: 4 ADMISSION DATE:  07/07/2019, CONSULTATION DATE:  07/09/19  REFERRING MD:  Alfredia Ferguson, CHIEF COMPLAINT:  Post-operative hypotension   Brief History   64 year old male patient admitted 5/4 in the setting of aspiration pneumonia with newly identified mass located in the hypopharynx.  Went to the operating room on 5/6 for direct laryngoscopy, biopsy, and tracheostomy.  Critical care asked to see for post-hypotension   History of present illness   This is a 64 year old male admitted 5/4 w/ chief complaint of cough, shortness of breath, fatigue, decreased appetite and unintentional weight loss.  On presentation found to have difficulty swallowing with poor p.o. intake and his own saliva.  Admitted with working diagnosis of sepsis secondary to aspiration.  Further diagnostics by CT imaging showed soft tissue in the hypopharynx suspicious for underlying mass a follow-up CT showed hypopharyngeal soft tissue mass, ENT was consulted on 5/5.  A flexible laryngoscopy was performed at bedside.  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  He went to the operating room on 5/6 for direct laryngoscopy, biopsy, and tracheostomy.  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 rate 20, height 5\' 11"  (1.803 m), weight 42 kg, SpO2 99 %.    FiO2 (%):  [28 %] 28 %   Intake/Output Summary (Last 24 hours) at 07/11/2019 1441 Last data filed at 07/11/2019 1408 Gross per 24 hour  Intake 2069.59 ml  Output 1400 ml  Net 669.59 ml   Filed Weights   07/07/19 2049 07/09/19 0949  Weight: 42 kg 42 kg    Examination: Tmax 97.6 General: cachetic bm >> stated age Pt alert, approp nad @ 30 degrees hob Oropharynx clear, no teech Neck supple trach in place, min crusty blood at trac site  Lungs with a few scattered exp > insp rhonchi bilaterally RRR no s3 or or sign murmur Abd soft/ scapphoid with min  excursion  Extr warm with no edema or clubbing noted Neuro  Sensorium appears intact     I personally reviewed images and agree with radiology impression as follows:  pCXR:   5/8 No change in dense LEFT lower lobe pneumonia and mild RIGHT lower lobe pneumonia.   Resolved Hospital Problem list     Assessment & Plan:   S/p trach for new diagnosis of head and neck cancer  -bx obtained intraoperatively 5/6 = poorly diff sq cell ca Plan -holding DVT prophylaxis today due to bleeding; -keep cuff inflated today due to bleeding and blood coughed out of mouth today -likely will be trach  dependent; agree with consult by radiation oncology -con't trach collar -appreciate Oncology's input -appreciate ENT's assistance  Post-operative hypotension; now hypertensive Likely  mix of residual anesthesia and hypovolemia. No longer meetings SIRS criteria Plan -con't IVF; will need PEG for long-term nutrition -con't to monitor  Copd w/ h/o cig use  Plan -Smoking cessation  -Cont BDs via TC. Will not be able to go home on oral inhalers; needs  nebs via TC.  afib w/ RVR -suspect exacerbated by surg Plan -con't to monitor on tele -avoid AC for now given bleeding  Aspiration PNA Plan - see updated flowheet above on unasyn with no change in cxr today  H/o ETOH abuse Plan -CIWA -con't to monitor  Dysphagia, Severe protein calorie malnutrition in setting of malignancy w/associated severe deconditioning and loss of muscle mass  Plan -needs PEG tube this coming week   Anemia of chronic illness; stable since admission Plan -con't to monitor   Lab Results  Component Value Date   HGB 11.3 (L) 07/11/2019   HGB 11.5 (L) 07/10/2019   HGB 11.1 (L) 07/09/2019     Hypokalemia -replete as needed  -con't to monitor   Best practice:  Diet: NPO Pain/Anxiety/Delirium protocol (if indicated): n/a VAP protocol (if indicated): na DVT prophylaxis: scd GI prophylaxis: ppi Glucose control:  Mobility: BR Code Status: full code  Family Communication: per primary  Disposition: ICU for aggressive respiratory care  Labs   CBC: Recent Labs  Lab 07/07/19 1650 07/08/19 0924 07/09/19 0438 07/10/19 0237 07/11/19 0238  WBC 8.4 6.2 8.8 10.0 5.9  NEUTROABS 6.3 4.3 6.9 7.6 3.5  HGB 12.7* 11.0* 11.1* 11.5* 11.3*  HCT 38.8* 32.8* 34.8* 34.7* 34.3*  MCV 87.2 86.1 88.8 85.0 86.4  PLT 276 273 297 249 962    Basic Metabolic Panel: Recent Labs  Lab  0000 07/07/19 1650 07/08/19 0412 07/08/19 0412 07/08/19 0924 07/09/19 0438 07/10/19 0237 07/10/19 1958 07/11/19 0238 07/11/19 0841  NA  --  138  --   --  144 145 143  --  139  --   K   < > 4.8  --   --  3.4* 3.8 3.3*  --  3.3* 3.2*  CL  --  100  --   --  107 106 104  --  102  --   CO2  --  25  --   --  24 22 27   --  27  --   GLUCOSE  --  100*  --   --  64* 47* 115*  --  85  --   BUN  --  27*  --   --  19 18 10   --  7*  --   CREATININE  --  0.86  --   --  0.60* 0.59* 0.51*  --  0.40*  --   CALCIUM  --  8.9  --   --  8.8* 8.8* 8.6*  --  8.4*  --   MG  --   --  1.6*   < >  1.6* 1.9 1.3* 2.3 2.1  --   PHOS  --   --  3.2  --  3.1 3.7 2.9  --  2.4*  --    < > = values in this interval not displayed.   GFR: Estimated Creatinine Clearance: 55.4 mL/min (A) (by C-G formula based on SCr of 0.4 mg/dL (L)). Recent Labs  Lab 07/07/19 1650 07/07/19 1650 07/07/19 1850 07/08/19 8366 07/09/19 0438 07/10/19 0237 07/11/19  0238  WBC 8.4   < >  --  6.2 8.8 10.0 5.9  LATICACIDVEN 2.5*  --  1.7  --   --   --   --    < > = values in this interval not displayed.    Liver Function Tests: Recent Labs  Lab 07/07/19 1650 07/08/19 0924 07/09/19 0438 07/10/19 0237 07/11/19 0238  AST 18 17 18 19 17   ALT 11 10 11 10 9   ALKPHOS 74 65 63 55 51  BILITOT 0.9 1.1 1.1 0.6 0.7  PROT 7.8 6.7 6.6 5.8* 5.5*  ALBUMIN 3.3* 2.9* 2.9* 2.4* 2.4*   No results for input(s): LIPASE, AMYLASE in the last 168 hours. No results for input(s): AMMONIA in the last 168 hours.  ABG No results found for: PHART, PCO2ART, PO2ART, HCO3, TCO2, ACIDBASEDEF, O2SAT   Coagulation Profile: Recent Labs  Lab 07/07/19 1650  INR 1.0    Cardiac Enzymes: No results for input(s): CKTOTAL, CKMB, CKMBINDEX, TROPONINI in the last 168 hours.  HbA1C: No results found for: HGBA1C  CBG: Recent Labs  Lab 07/11/19 0355 07/11/19 0456 07/11/19 0739 07/11/19 0831 07/11/19 1137  GLUCAP 67* 107* 55* 85 87      Christinia Gully, MD Pulmonary and [place] [phone] After 6:00 PM or weekends, use Beeper [phone]  After 7:00 pm call Elink  [phone]

## 2019-07-12 DIAGNOSIS — R6889 Other general symptoms and signs: Secondary | ICD-10-CM

## 2019-07-12 LAB — GLUCOSE, CAPILLARY
Glucose-Capillary: 100 mg/dL — ABNORMAL HIGH (ref 70–99)
Glucose-Capillary: 103 mg/dL — ABNORMAL HIGH (ref 70–99)
Glucose-Capillary: 87 mg/dL (ref 70–99)
Glucose-Capillary: 93 mg/dL (ref 70–99)
Glucose-Capillary: 99 mg/dL (ref 70–99)
Glucose-Capillary: 99 mg/dL (ref 70–99)

## 2019-07-12 LAB — COMPREHENSIVE METABOLIC PANEL
ALT: 8 U/L (ref 0–44)
AST: 16 U/L (ref 15–41)
Albumin: 2.2 g/dL — ABNORMAL LOW (ref 3.5–5.0)
Alkaline Phosphatase: 50 U/L (ref 38–126)
Anion gap: 9 (ref 5–15)
BUN: <5 mg/dL — ABNORMAL LOW (ref 8–23)
CO2: 26 mmol/L (ref 22–32)
Calcium: 8.1 mg/dL — ABNORMAL LOW (ref 8.9–10.3)
Chloride: 95 mmol/L — ABNORMAL LOW (ref 98–111)
Creatinine, Ser: 0.4 mg/dL — ABNORMAL LOW (ref 0.61–1.24)
GFR calc Af Amer: >60 mL/min (ref >60)
GFR calc non Af Amer: >60 mL/min (ref >60)
Glucose, Bld: 98 mg/dL (ref 70–99)
Potassium: 3.1 mmol/L — ABNORMAL LOW (ref 3.5–5.1)
Sodium: 130 mmol/L — ABNORMAL LOW (ref 135–145)
Total Bilirubin: 0.7 mg/dL (ref 0.3–1.2)
Total Protein: 5.3 g/dL — ABNORMAL LOW (ref 6.5–8.1)

## 2019-07-12 LAB — CBC WITH DIFFERENTIAL/PLATELET
Abs Immature Granulocytes: 0.02 10*3/uL (ref 0.00–0.07)
Basophils Absolute: 0 10*3/uL (ref 0.0–0.1)
Basophils Relative: 0 %
Eosinophils Absolute: 0 10*3/uL (ref 0.0–0.5)
Eosinophils Relative: 0 %
HCT: 29.5 % — ABNORMAL LOW (ref 39.0–52.0)
Hemoglobin: 10 g/dL — ABNORMAL LOW (ref 13.0–17.0)
Immature Granulocytes: 0 %
Lymphocytes Relative: 23 %
Lymphs Abs: 1.3 10*3/uL (ref 0.7–4.0)
MCH: 27.8 pg (ref 26.0–34.0)
MCHC: 33.9 g/dL (ref 30.0–36.0)
MCV: 81.9 fL (ref 80.0–100.0)
Monocytes Absolute: 0.5 10*3/uL (ref 0.1–1.0)
Monocytes Relative: 8 %
Neutro Abs: 4.1 10*3/uL (ref 1.7–7.7)
Neutrophils Relative %: 69 %
Platelets: 208 10*3/uL (ref 150–400)
RBC: 3.6 MIL/uL — ABNORMAL LOW (ref 4.22–5.81)
RDW: 12.2 % (ref 11.5–15.5)
WBC: 5.9 10*3/uL (ref 4.0–10.5)
nRBC: 0 % (ref 0.0–0.2)

## 2019-07-12 LAB — CULTURE, BLOOD (ROUTINE X 2)
Culture: NO GROWTH
Culture: NO GROWTH

## 2019-07-12 LAB — PHOSPHORUS: Phosphorus: 2.4 mg/dL — ABNORMAL LOW (ref 2.5–4.6)

## 2019-07-12 LAB — MAGNESIUM: Magnesium: 1.2 mg/dL — ABNORMAL LOW (ref 1.7–2.4)

## 2019-07-12 MED ORDER — ACETYLCYSTEINE 20 % IN SOLN
2.0000 mL | Freq: Three times a day (TID) | RESPIRATORY_TRACT | Status: DC
  Administered 2019-07-12 – 2019-07-14 (×6): 2 mL via RESPIRATORY_TRACT
  Filled 2019-07-12 (×5): qty 4
  Filled 2019-07-12: qty 2
  Filled 2019-07-12: qty 4
  Filled 2019-07-12: qty 2

## 2019-07-12 MED ORDER — ALBUTEROL SULFATE (2.5 MG/3ML) 0.083% IN NEBU
2.5000 mg | INHALATION_SOLUTION | Freq: Three times a day (TID) | RESPIRATORY_TRACT | Status: DC
  Administered 2019-07-12 – 2019-07-17 (×15): 2.5 mg via RESPIRATORY_TRACT
  Filled 2019-07-12 (×15): qty 3

## 2019-07-12 MED ORDER — POTASSIUM PHOSPHATES 15 MMOLE/5ML IV SOLN
20.0000 mmol | Freq: Once | INTRAVENOUS | Status: AC
  Administered 2019-07-12: 20 mmol via INTRAVENOUS
  Filled 2019-07-12: qty 6.67

## 2019-07-12 MED ORDER — POTASSIUM CHLORIDE 10 MEQ/100ML IV SOLN
10.0000 meq | INTRAVENOUS | Status: AC
  Administered 2019-07-12 (×6): 10 meq via INTRAVENOUS
  Filled 2019-07-12 (×6): qty 100

## 2019-07-12 MED ORDER — SODIUM CHLORIDE 0.9 % IV SOLN: INTRAVENOUS | Status: DC

## 2019-07-12 MED ORDER — GLYCOPYRROLATE 0.2 MG/ML IJ SOLN
0.1000 mg | Freq: Two times a day (BID) | INTRAMUSCULAR | Status: DC
  Administered 2019-07-12 – 2019-07-20 (×17): 0.1 mg via INTRAVENOUS
  Filled 2019-07-12 (×17): qty 1

## 2019-07-12 MED ORDER — MAGNESIUM SULFATE 4 GM/100ML IV SOLN
4.0000 g | Freq: Once | INTRAVENOUS | Status: AC
  Administered 2019-07-12: 4 g via INTRAVENOUS
  Filled 2019-07-12: qty 100

## 2019-07-12 NOTE — Plan of Care (Signed)
Potassium level last night at approximately 2000 had improved to 4.2. This morning has decreased to 3.1. Patient has not had bowel movement or vomiting. Unsure where potassium losses occurring unless related to overall condition. On call provider informed. Patient continues to deny pain or distress beyond when he coughs out mucus from trach. Patient has declined repositioning all night.    Problem: Education: Goal: Knowledge of General Education information will improve Description: Including pain rating scale, medication(s)/side effects and non-pharmacologic comfort measures Outcome: Progressing   Problem: Health Behavior/Discharge Planning: Goal: Ability to manage health-related needs will improve Outcome: Progressing   Problem: Clinical Measurements: Goal: Ability to maintain clinical measurements within normal limits will improve Outcome: Progressing Goal: Will remain free from infection Outcome: Progressing Goal: Diagnostic test results will improve Outcome: Progressing Goal: Respiratory complications will improve Outcome: Progressing Goal: Cardiovascular complication will be avoided Outcome: Progressing   Problem: Activity: Goal: Risk for activity intolerance will decrease Outcome: Progressing   Problem: Nutrition: Goal: Adequate nutrition will be maintained Outcome: Progressing   Problem: Coping: Goal: Level of anxiety will decrease Outcome: Progressing   Problem: Elimination: Goal: Will not experience complications related to bowel motility Outcome: Progressing Goal: Will not experience complications related to urinary retention Outcome: Progressing   Problem: Pain Managment: Goal: General experience of comfort will improve Outcome: Progressing   Problem: Safety: Goal: Ability to remain free from injury will improve Outcome: Progressing   Problem: Skin Integrity: Goal: Risk for impaired skin integrity will decrease Outcome: Progressing

## 2019-07-12 NOTE — Progress Notes (Signed)
PROGRESS NOTE    Devon Peterson  [id number] DOB: [birth year] DOA: 07/07/2019 PCP: Patient, No Pcp Per  Brief Narrative:  HPI per Dr. Shela Leff on 07/07/19 Devon Peterson is a 64 y.o. male with medical history significant of COPD, polysubstance abuse (alcohol, tobacco, cocaine), hospital admission 04/17/2019-04/19/2019 for sepsis secondary to pneumonia and COPD exacerbation presenting to the ED via EMS for evaluation of cough, shortness of breath, fatigue, and unintentional weight loss.  History provided by patient and his nephew at bedside.  Nephew states patient was admitted to the hospital a month ago for pneumonia and since then has continued to do poorly.  He has difficulty swallowing food and has been spitting up saliva.  He is coughing a lot.  Patient denies fevers, chills, shortness of breath, chest pain, nausea, vomiting, abdominal pain, diarrhea, or dysuria.  Nephew states patient has been smoking a pack of cigarettes daily since a very young age.  He has lost a lot of weight recently. Addendum: 07/07/2019 8:40 PM.  Patient also denied history of hematemesis, hematochezia, or melena.  ED Course: Tachycardic on arrival with heart rate in the 150s, appeared to be sinus rhythm.  Afebrile.  Slightly tachypneic.  Labs showing no leukocytosis.  Initial lactic acid 2.5, repeat after IV fluid pending.  Hemoglobin 12.7, stable compared to labs done in February 2021.  Platelet count normal.  BUN 27, creatinine 0.8.  Albumin 3.3.  LFTs normal.  Blood culture x2 pending.  SARS-CoV-2 PCR test negative.  Influenza panel negative.  UA not suggestive of infection.  Urine culture pending. Chest x-ray showing new left lower lobe airspace opacity concerning for pneumonia. CT angiogram chest negative for PE.  Showing abnormal soft tissue in the hypopharynx suspicious for underlying mass.  Dense bilateral lower lobe consolidation, left greater than right.  Aspiration suspected given findings in the hypopharynx.   Also showing evidence of an enhancing mass within the densely consolidated left lower lobe, metastatic disease or primary neoplasm not excluded.  Marked cachexia.  Patient received ceftriaxone, azithromycin, and 2 L normal saline boluses.  **Interim History He was evaluated by ENT who recommended a tracheostomy.  Tracheostomy was done on 07/10/19 and postoperatively he ended up on pressors which were weaned off.  He is also getting a tissue biopsy and medical oncology as well as radiation oncology were consulted for further evaluation.  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 Postoperatively he had hypotension and went in A. fib with RVR and started on Neo-Synephrine drip has been stopped.  Critical care was consulted for further evaluation and his hypotension is improved.  We will continue IV fluid but patient needs long-term nutrition needs and needs a PEG tube and have called IR but they will reevaluate Monday for a PEG tube given the national shortage of PEG tube.  Yesterday Patient's tracheostomy was doing okay but he was having a lot of secretions but not as much.  Again radiation oncology wants to have improvement in the patient's performance status as well as nutritional status as well as further evaluation of the lung mass with likely pet imaging as an outpatient and potential biopsy of lung lesion to determine if this is metastatic versus a second primary.  This will help determine radiation oncology if treatment will be of curative or more of a palliative approach.  They will continue to collaborate with medical oncology  He continues to have significant amount of secretions however they are becoming thicker we will try Mucomyst as well as  Robinul to thin the secretions.  He denies any current pain at this time but was very weak and is severely deconditioned so PT OT was reconsulted again.  Assessment & Plan:   Principal Problem:   Aspiration pneumonia ([place]) Active  Problems:   Sepsis ([place])   Dysphagia   Severe protein-calorie malnutrition ([place])   Alcohol use   Pressure injury of skin   Lung mass   Neck mass  Sepsis 2/2 to Aspiration Pneumonia -Was Tachycardic and tachypneic on arrival.  No leukocytosis on labs.  -Does have mild lactic acidosis.   -CT showing dense bilateral lower lobe consolidation, left greater than right suspicious for aspiration pneumonia given given findings suspicious for an abnormal soft tissue mass in the hypopharynx.  -SARS-CoV-2 PCR test negative.  Influenza panel negative.   -Sepsis physiology has improved but he is now hypotensive and see below -Currently satting in the low to mid 90s on room air. -Heart rate has improved after IV fluid boluses but post operatively he went into A Fib with RVR and is now improved  -Continue IV fluid hydration and antibiotic coverage with Unasyn will continue for total 7 days. Today is Day 5/7 -Trend lactate.   -Blood culture x2 showed no growth today at 4 days -CT Neck showed "Extension of pneumonia into the left upper lobe. Probable small left pleural effusion. Maxillary sinus air-fluid levels, a nonspecific finding that can reflect acute sinusitis in the appropriate setting." -CT Abd and Pelvis showed "Interval worsening of bibasilar airspace consolidation left base worse than right likely due to pneumonia. Stable rim enhancing low-density focus over the left base consolidation which may be due to necrosis, pulmonary abscess or underlying malignancy.   No evidence of metastatic disease within the abdomen. Bilateral streaky cortical low-attenuation over the kidneys which can be seen in pyelonephritis. Recommend clinical correlation.  Aortic Atherosclerosis (ICD10-I70.0). Findings compatible patient's clinical cachexia with significant decreased subcutaneous and mesenteric fat." -Continuous pulse ox, supplemental oxygen if needed. -Patient now has a tracheostomy and will be admitted to the ICU  for postoperative care -Obtain SLP Evaluation and MBS; after MBS was done SLP recommending n.p.o. 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  -Also showing evidence of an enhancing mass within the densely consolidated left lower lobe, metastatic disease or primary neoplasm not excluded.   -Patient is severely cachectic.  Endorsing dysphagia and unintentional weight loss.   -Longstanding history of smoking cigarettes. -Ordered CT soft tissue neck with contrast and showed "Suboptimal evaluation due to motion artifact and poor soft tissue resolution in the setting of significant cachexia and probable diffuse edema. Enhancing hypopharyngeal soft tissue in the post cricoid region extending superiorly with effacement of the piriform sinuses and thickening of the aryepiglottic folds. Malignancy is suspected and direct visual inspection is recommended. Extension of pneumonia into the left upper lobe. Probable small left pleural effusion. Maxillary sinus air-fluid levels, a nonspecific finding that can reflect acute sinusitis in the appropriate setting." -CT of the  abdomen pelvis showed "Interval worsening of bibasilar airspace consolidation left base worse than right likely due to pneumonia. Stable rim enhancing low-density focus over the left base consolidation which may be due to necrosis, pulmonary abscess or underlying malignancy.   No evidence of metastatic disease  within the abdomen. Bilateral streaky cortical low-attenuation over the kidneys which can be seen in pyelonephritis. Recommend clinical correlation.  Aortic Atherosclerosis (ICD10-I70.0). 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  They will arrange follow-up for the left lung lesion and arrange for biopsy of the masslike component persists and they are going to discuss the diagnosis of laryngeal cancer treatment options and they will reevaluate 510 -Radiation oncology also followed up today and they want his nutritional status a little bit better before they discussed radiation treatments as I mentioned above -PEG tube to be done on Monday likely -Pulmonary feels that the patient will likely be trach dependent and they recommend continuing trach collar: They recommend keeping the cuff inflated related to his bleeding and blood coughed out of the mouth yesterday and today -SLP consulted to do PMSV trials -Dr. Wilburn Cornelia of ENT is on board and appreciate assistance -Having a lot of Secretions so will add Acetylcysteine 2 mL TID and will add Glycopyrrolate 0.1 mg IV BID -Continue to Frequently Suction  Dysphagia -Keep n.p.o. at  this time, SLP done and MBS and recommending NPO -He is now having a tracheostomy and likely will need a PEG tube placement -IR consulted and he is a good candidate for a PEG tube but likely will be done on Monday potentially  A. fib with RVR, now improved -Went into A. fib with RVR in the PACU and was given IV metoprolol -Heart rates have improved some but he is hypotensive and had to be placed on a Neo-Synephrine drip -We will not anticoagulate at this time but may need anticoagulation soon -Continue monitor closely and he will be admitted to the ICU for postoperative care -He had some bleeding this morning and will avoid anticoagulation at this time  Hypotension, improving  -Postoperative hypotension and likely in the setting of his anesthesia and hypovolemia -He is getting bolused and was placed on a Neo-Synephrine drip and now off of it -Patient was off of pressors by time he arrived to the ICU and will be transferred back to the [facility] service  Severe protein calorie malnutrition/Underweight -Patient has poor oral intake, unintentional weight loss, and severe cachexia.   -Nutrition consult ordered and recommending advancing diet as medically feasible and if appropriate order diet they are recommending Ensure alive p.o. twice daily, Magic cup twice daily with meals, Juven twice daily, and recommending continue to monitor magnesium, potassium and phosphorus for least 3 days as patient is at risk for refeeding syndrome given his alcohol abuse  Generalized weakness/physical deconditioning:  -  PT and OT evaluation recommending Home Health PT  Alcohol use disorder -Tachycardic on arrival, heart rate improved after fluid boluses.  No other signs of withdrawal at this time. -Order CIWA protocol; Ativan as needed.  Thiamine, folate, and multivitamin.  Tobacco Use -Patient has been counseled on smoking cessation.   -NicoDerm patch ordered.  History of Cocaine use -Check UDS and was  POSITIVE -Will avoid further Beta Blockers   COPD -Stable.   -No wheezing at this time.   -DuoNebs as needed via the trach collar; will not be able to go home on oral inhalers and needs nebs via trach collar at discharge  Hypoglycemia -In the setting of poor p.o. intake -Has been low as he is not taking in more po Intake -He was given D50 and then started on D5 drip and will continue however continue to be a little low so we will stop the D5 and start the patient on D10 drip at 75 mL's per hour -D10 gtt has been reduced to 50 mL/hr given that we will add NS at 50 mL/hr -We will continue D5 drip for now -Continue monitor as his BS have been ranging from 87-108  Hypokalemia  -Patient's potassium this morning was 3.1 -Replete with IV KCl 40 mEq x6 and IV K Phos 20 mmol -Continue to monitor and replete as necessary -Repeat CMP in a.m.  Hypomagnesemia -Patient's magnesium level now 1.2 -Replete with IV Mag Sulfate 40 mg  -Continue to monitor and replete as necessary  -Repeat magnesium level in a.m.  Hypophosphatemia -Patient's Phos Level was 2.4 -Replete with IV K Phos 20 mmol -Continue to Monitor and Replete as Necessary -Repeat Phos Level in the AM   Hyponatremia -Patient's Na+ has dropped to 130 in the setting of D10 gtt -Added NS at 50 mL/hr  -Continue to Monitor and Trend -Repeat CMP in AM  Normocytic Anemia -Patient's hemoglobin/hematocrit is now stable and slightly dropped from yesterday today and is 11.3/34.3 -Check anemia panel and showed an iron level of 30, U IBC 130, TIBC 150, saturation ratios of 19%, ferritin level 421, folate level 11.5, and vitamin B12 of 281 -Continue to monitor for signs and symptoms of bleeding; currently had a lot of bleeding from his trach site to be injected with lidocaine -Repeat CBC in a.m.  Stage II sacral pressure ulcer -Present on admission -Continue with Mepilex pads and frequent turning  DVT prophylaxis: Enoxaparin 40 mg sq  q24h  Code Status: FULL CODE  Family Communication: No family present at bedside  Disposition Plan: Patient is from home now here with aspiration pneumonia and likely malignancy.  He is unsafe to swallow.  Will need ENT evaluation and likely biopsy and further oncology evaluation.  May require PEG tube placement this visit. He is being transferred to the ICU for Postoperative Hypotenstion continues to have significant oral secretions so will remain in ICU today  Status is: Inpatient  Remains inpatient appropriate because:Persistent severe electrolyte disturbances, IV treatments appropriate due to intensity of illness or inability to take PO and Inpatient level of care appropriate due to severity of illness   Dispo: The patient is from: Home              Anticipated d/c is to: Home              Anticipated d/c date is: 3 days              Patient currently is not medically stable to d/c.  Consultants:   ENT  PCCM  Medical oncology  Patient oncology  Interventional radiology   Procedures:  CT Neck and CT Abd/Pelvis  Procedure done by Dr. Wilburn Cornelia  1.  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  Tracheostomy  Antimicrobials:  Anti-infectives (From admission, onward)   Start     Dose/Rate Route Frequency Ordered Stop   07/07/19 2200  ampicillin-sulbactam (UNASYN) 1.5 g in sodium chloride 0.9 % 100 mL IVPB     1.5 g 200 mL/hr over 30 Minutes Intravenous Every 6 hours 07/07/19 2051 07/14/19 2159   07/07/19 1930  metroNIDAZOLE (FLAGYL) IVPB 500 mg  Status:  Discontinued     500 mg 100 mL/hr over 60 Minutes Intravenous  Once 07/07/19 1922 07/07/19 2003   07/07/19 1800  cefTRIAXone (ROCEPHIN) 1 g in sodium chloride 0.9 % 100 mL IVPB     1 g 200 mL/hr over 30 Minutes Intravenous  Once 07/07/19 1750 07/07/19 1830   07/07/19 1800  azithromycin (ZITHROMAX) 500 mg in sodium chloride 0.9 % 250 mL IVPB     500 mg 250 mL/hr over 60  Minutes Intravenous  Once 07/07/19 1750 07/07/19 1855     Subjective: Seen and examined at bedside and again remains nonverbal due to his tracheostomy status but nursing states that he continues to have significant amount of secretions and this secretions are thicker now.  He is extremely weak and having significant debility especially when he ambulated to the restroom.  We will need physical therapy to further evaluate  Objective: Vitals:   07/12/19 1032 07/12/19 1100 07/12/19 1127 07/12/19 1200  BP: 128/85 110/79  (!) 135/96  Pulse:   84   Resp: (!) 25 (!) 21 (!) 26 20  Temp:   (!) 96.8 F (36 C)   TempSrc:   Axillary   SpO2: 96%  97% 99%  Weight:      Height:        Intake/Output Summary (Last 24 hours) at 07/12/2019 1221 Last data filed at 07/12/2019 1200 Gross per 24 hour  Intake 3066.77 ml  Output 1650 ml  Net 1416.77 ml   Filed Weights   07/07/19 2049 07/09/19 0949  Weight: 42 kg 42 kg   Examination: Physical Exam:  Constitutional: The patient is an extremely thin and cachectic chronically ill-appearing African-American male who is slightly disheveled currently in no acute distress.  He continues to have significant amount of secretions and they are thicker.  He appears calm laying in bed and nursing reports that he is extremely weak. Eyes:  Lids and conjunctivae normal, sclerae anicteric  ENMT: External Ears, Nose appear normal. Grossly normal hearing. Mucous membranes are moist. Neck: Appears normal, supple, no cervical masses, normal ROM, no appreciable thyromegaly; no JVD but he does have tracheostomy in place connected to the ATC in he does have some thick secretions Respiratory: Diminished to auscultation bilaterally bilateral rhonchi and coarse breath sounds.  More so on the left compared to the right.  No appreciable wheezing, or crackles.  He has unlabored breathing but he is connected to the ATC Cardiovascular: RRR, no murmurs / rubs / gallops. S1 and S2  auscultated. No extremity edema.  Abdomen: Soft, non-tender, non-distended. Bowel sounds positive.  GU: Deferred. Musculoskeletal: No clubbing / cyanosis of digits/nails.  No joint deformity upper and lower extremities.  Skin: No rashes, lesions, ulcers on limited skin evaluation but he does have a sacral decubitus ulcer that I did not view.  Tracheostomy site is still a little raw but not as bloody. No induration; Warm and dry.  Neurologic: CN 2-12 grossly intact is nonverbal due to his tracheostomy status. Romberg sign and cerebellar reflexes not assessed.  Psychiatric: Normal judgment and insight. Alert and oriented x 3. Normal mood and appropriate affect.   Data Reviewed: I have personally reviewed following labs and imaging studies  CBC: Recent Labs  Lab 07/08/19 0924 07/09/19 0438 07/10/19 0237 07/11/19 0238 07/12/19 0330  WBC 6.2 8.8 10.0 5.9 5.9  NEUTROABS 4.3 6.9 7.6 3.5 4.1  HGB 11.0* 11.1* 11.5* 11.3* 10.0*  HCT 32.8* 34.8* 34.7* 34.3* 29.5*  MCV 86.1 88.8 85.0 86.4 81.9  PLT 273 297 249 212 161   Basic Metabolic Panel: Recent Labs  Lab 07/08/19 0924 07/08/19 0924 07/09/19 0438 07/09/19 0438 07/10/19 0237 07/10/19 1958 07/11/19 0238 07/11/19 0841 07/11/19 1958 07/12/19 0330  NA 144   < > 145  --  143  --  139  --  133* 130*  K 3.4*   < > 3.8   < > 3.3*  --  3.3* 3.2* 4.2 3.1*  CL 107   < > 106  --  104  --  102  --  93* 95*  CO2 24   < > 22  --  27  --  27  --  27 26  GLUCOSE 64*   < > 47*  --  115*  --  85  --  234* 98  BUN 19   < > 18  --  10  --  7*  --  5* <5*  CREATININE 0.60*   < > 0.59*  --  0.51*  --  0.40*  --  0.51* 0.40*  CALCIUM 8.8*   < > 8.8*  --  8.6*  --  8.4*  --  8.5* 8.1*  MG 1.6*   < > 1.9  --  1.3* 2.3 2.1  --   --  1.2*  PHOS 3.1  --  3.7  --  2.9  --  2.4*  --   --  2.4*   < > = values in this interval not displayed.   GFR: Estimated Creatinine Clearance: 55.4 mL/min (A) (by C-G formula based on SCr of 0.4 mg/dL (L)). Liver Function  Tests: Recent Labs  Lab 07/08/19 0924 07/09/19 0438 07/10/19 0237 07/11/19 0238 07/12/19 0330  AST 17 18 19 17 16   ALT 10 11 10 9 8   ALKPHOS 65 63 55 51 50  BILITOT 1.1 1.1 0.6 0.7 0.7  PROT 6.7 6.6 5.8* 5.5* 5.3*  ALBUMIN 2.9* 2.9* 2.4* 2.4* 2.2*   No results for input(s): LIPASE, AMYLASE in the last 168 hours. No results for input(s): AMMONIA in the last 168 hours. Coagulation Profile: Recent Labs  Lab 07/07/19 1650  INR 1.0   Cardiac Enzymes: No results for input(s): CKTOTAL, CKMB, CKMBINDEX, TROPONINI in the last 168 hours. BNP (last 3 results) No results for input(s): PROBNP in the last 8760 hours. HbA1C: No results for input(s): HGBA1C in the last 72 hours. CBG: Recent Labs  Lab 07/11/19 1941 07/11/19 2355 07/12/19 0330 07/12/19 0758 07/12/19 1151  GLUCAP 102* 91 93 103* 99   Lipid Profile: No results for input(s): CHOL, HDL, LDLCALC, TRIG, CHOLHDL, LDLDIRECT in the last 72 hours.  Thyroid Function Tests: No results for input(s): TSH, T4TOTAL, FREET4, T3FREE, THYROIDAB in the last 72 hours. Anemia Panel: No results for input(s): VITAMINB12, FOLATE, FERRITIN, TIBC, IRON, RETICCTPCT in the last 72 hours. Sepsis Labs: Recent Labs  Lab 07/07/19 1650 07/07/19 1850  LATICACIDVEN 2.5* 1.7    Recent Results (from the past 240 hour(s))  Blood Culture (routine x 2)     Status: None   Collection Time: 07/07/19  4:45 PM   Specimen: BLOOD LEFT HAND  Result Value Ref Range Status   Specimen Description   Final    BLOOD LEFT HAND Performed at [facility], [place] [address], [place], [place] [address]    Special Requests   Final    BOTTLES DRAWN AEROBIC ONLY Blood Culture results may not be optimal due to an inadequate volume of blood received in culture bottles Performed at [facility] [address], [place], [place] [address]    Culture   Final    NO GROWTH 5 DAYS Performed at [facility], [place] [address]  [place], [place] [address]    Report Status 07/12/2019 FINAL  Final  Blood Culture (routine x 2)     Status: None   Collection Time: 07/07/19  4:46 PM   Specimen: BLOOD  Result Value Ref Range Status   Specimen Description   Final    BLOOD RIGHT ANTECUBITAL Performed at [facility] [address] [place], [place] [address]    Special Requests   Final    BOTTLES DRAWN AEROBIC AND ANAEROBIC Blood Culture results may not be optimal due to an inadequate volume of blood received in culture bottles Performed at [facility] [address] [place], [place] [address]    Culture   Final    NO GROWTH 5 DAYS Performed at [facility], [place] [address], [place], [place] [address]    Report Status 07/12/2019 FINAL  Final  Respiratory Panel by RT PCR (Flu A&B, Covid) - Nasopharyngeal Swab     Status: None   Collection Time: 07/07/19  5:14 PM   Specimen: Nasopharyngeal Swab  Result Value Ref Range Status   SARS Coronavirus 2 by RT PCR NEGATIVE NEGATIVE Final    Comment: (NOTE) SARS-CoV-2 target nucleic acids are NOT DETECTED. The SARS-CoV-2 RNA is generally detectable in upper respiratoy specimens during the acute phase of infection. The lowest concentration of SARS-CoV-2 viral copies this assay can detect is 131 copies/mL. A negative result does not preclude SARS-Cov-2 infection and should not be used as the sole basis for treatment or other patient management decisions. A negative result may occur with  improper specimen collection/handling, submission of specimen other than nasopharyngeal swab, presence of viral mutation(s) within the areas targeted by this assay, and inadequate number of viral copies (<131 copies/mL). A negative result must be combined with clinical observations, patient history, and epidemiological information. The expected result is Negative. Fact Sheet for Patients:  PinkCheek.be Fact Sheet for  Healthcare Providers:  GravelBags.it This test is not yet ap proved or cleared by the [place] FDA and  has been authorized for detection and/or diagnosis of SARS-CoV-2 by FDA under an Emergency Use Authorization (EUA). This EUA will remain  in effect (meaning this test can be used) for the duration of the COVID-19 declaration under Section 564(b)(1) of the Act, 21 U.S.C. section 360bbb-3(b)(1), unless the authorization is terminated or revoked sooner.    Influenza A by PCR NEGATIVE  NEGATIVE Final   Influenza B by PCR NEGATIVE NEGATIVE Final    Comment: (NOTE) The Xpert Xpress SARS-CoV-2/FLU/RSV assay is intended as an aid in  the diagnosis of influenza from Nasopharyngeal swab specimens and  should not be used as a sole basis for treatment. Nasal washings and  aspirates are unacceptable for Xpert Xpress SARS-CoV-2/FLU/RSV  testing. Fact Sheet for Patients: PinkCheek.be Fact Sheet for Healthcare Providers: GravelBags.it This test is not yet approved or cleared by the [place] FDA and  has been authorized for detection and/or diagnosis of SARS-CoV-2 by  FDA under an Emergency Use Authorization (EUA). This EUA will remain  in effect (meaning this test can be used) for the duration of the  Covid-19 declaration under Section 564(b)(1) of the Act, 21  U.S.C. section 360bbb-3(b)(1), unless the authorization is  terminated or revoked. Performed at [facility], [place] [address] [place], [place] [address]   Urine culture     Status: Abnormal   Collection Time: 07/07/19  5:29 PM   Specimen: In/Out Cath Urine  Result Value Ref Range Status   Specimen Description   Final    IN/OUT CATH URINE Performed at [place] [facility] [address] [place], [place] [address]    Special Requests   Final    NONE Performed at [facility], [place] [address], [place], [place] [address]    Culture MULTIPLE SPECIES PRESENT, SUGGEST RECOLLECTION (A)  Final   Report Status 07/08/2019 FINAL  Final  MRSA PCR Screening     Status: None   Collection Time: 07/09/19  4:35 PM   Specimen: Nasopharyngeal  Result Value Ref Range Status   MRSA by PCR NEGATIVE NEGATIVE Final    Comment:        The GeneXpert MRSA Assay (FDA approved for NASAL specimens only), is one component of a comprehensive MRSA colonization surveillance program. It is not intended to diagnose MRSA infection nor to guide or monitor treatment for MRSA infections. Performed at [facility], [place] [address], [place], [place] [address]      RN Pressure Injury Documentation: Pressure Injury 07/07/19 Sacrum Mid;Upper Stage 2 -  Partial thickness loss of dermis presenting as a shallow open injury with a red, pink wound bed without slough. 3 each small separate open areas (Active)  07/07/19 2107  Location: Sacrum  Location Orientation: Mid;Upper  Staging: Stage 2 -  Partial thickness loss of dermis presenting as a shallow open injury with a red, pink wound bed without slough.  Wound Description (Comments): 3 each small separate open areas  Present on Admission: Yes     Estimated body mass index is 12.91 kg/m as calculated from the following:   Height as of this encounter: 5\' 11"  (1.803 m).   Weight as of this encounter: 42 kg.  Malnutrition Type:  Nutrition Problem: Severe Malnutrition Etiology: chronic illness(COPD)   Malnutrition Characteristics:  Signs/Symptoms: severe fat depletion, severe muscle depletion   Nutrition Interventions:  Interventions: Refer to RD note for recommendations   Radiology Studies: DG CHEST PORT 1 VIEW  Result Date: 07/11/2019 CLINICAL DATA:  Short of breath EXAM: PORTABLE CHEST 1 VIEW COMPARISON:  CT 07/07/2019, radiograph 07/09/2019 FINDINGS: Tracheostomy tube unchanged.  Normal cardiac silhouette. There is bibasilar  airspace disease. Airspace disease is dense at the LEFT lung base. Findings correspond to a dense lower lobe pneumonia seen on comparison CT. Upper lobes are clear. IMPRESSION: No change in dense LEFT lower lobe pneumonia  and mild RIGHT lower lobe pneumonia. Electronically Signed   By: Suzy Bouchard M.D.   On: 07/11/2019 08:01   Scheduled Meds: . acetylcysteine  2 mL Nebulization TID  . albuterol  2.5 mg Nebulization TID  . chlorhexidine  15 mL Mouth Rinse BID  . Chlorhexidine Gluconate Cloth  6 each Topical Daily  . glycopyrrolate  0.1 mg Intravenous BID  . mouth rinse  15 mL Mouth Rinse BID  . mouth rinse  15 mL Mouth Rinse q12n4p  . thiamine  100 mg Intravenous Daily   Continuous Infusions: . sodium chloride 50 mL/hr at 07/12/19 1200  . ampicillin-sulbactam (UNASYN) IV Stopped (07/12/19 1150)  . dextrose 50 mL/hr at 07/12/19 1200  . potassium chloride 10 mEq (07/12/19 1117)  . potassium PHOSPHATE IVPB (in mmol) 20 mmol (07/12/19 1204)    LOS: 5 days   Kerney Elbe, DO [facility] PAGER is on [place]  If 7PM-7AM, please contact night-coverage [email or web address]

## 2019-07-12 NOTE — Progress Notes (Signed)
CRITICAL VALUE ALERT  Critical Value:  K 3.1  Date & Time Notied:  07-12-19 @ 0630  Provider Notified: Dr. Humphrey Rolls  Orders Received/Actions taken: Paged Dr. Humphrey Rolls to inform.

## 2019-07-13 ENCOUNTER — Inpatient Hospital Stay (HOSPITAL_COMMUNITY): Payer: Medicaid Other

## 2019-07-13 ENCOUNTER — Telehealth: Payer: Self-pay | Admitting: Internal Medicine

## 2019-07-13 DIAGNOSIS — J690 Pneumonitis due to inhalation of food and vomit: Secondary | ICD-10-CM

## 2019-07-13 DIAGNOSIS — Z430 Encounter for attention to tracheostomy: Secondary | ICD-10-CM

## 2019-07-13 HISTORY — PX: IR FLUORO RM 30-60 MIN: IMG2384

## 2019-07-13 LAB — MAGNESIUM: Magnesium: 1.7 mg/dL (ref 1.7–2.4)

## 2019-07-13 LAB — CBC WITH DIFFERENTIAL/PLATELET
Abs Immature Granulocytes: 0.02 10*3/uL (ref 0.00–0.07)
Basophils Absolute: 0 10*3/uL (ref 0.0–0.1)
Basophils Relative: 0 %
Eosinophils Absolute: 0 10*3/uL (ref 0.0–0.5)
Eosinophils Relative: 0 %
HCT: 29.5 % — ABNORMAL LOW (ref 39.0–52.0)
Hemoglobin: 10 g/dL — ABNORMAL LOW (ref 13.0–17.0)
Immature Granulocytes: 0 %
Lymphocytes Relative: 22 %
Lymphs Abs: 1.2 10*3/uL (ref 0.7–4.0)
MCH: 28.2 pg (ref 26.0–34.0)
MCHC: 33.9 g/dL (ref 30.0–36.0)
MCV: 83.3 fL (ref 80.0–100.0)
Monocytes Absolute: 0.3 10*3/uL (ref 0.1–1.0)
Monocytes Relative: 6 %
Neutro Abs: 4.1 10*3/uL (ref 1.7–7.7)
Neutrophils Relative %: 72 %
Platelets: 212 10*3/uL (ref 150–400)
RBC: 3.54 MIL/uL — ABNORMAL LOW (ref 4.22–5.81)
RDW: 12.2 % (ref 11.5–15.5)
WBC: 5.7 10*3/uL (ref 4.0–10.5)
nRBC: 0 % (ref 0.0–0.2)

## 2019-07-13 LAB — PROTIME-INR
INR: 1.1 (ref 0.8–1.2)
Prothrombin Time: 13.5 seconds (ref 11.4–15.2)

## 2019-07-13 LAB — COMPREHENSIVE METABOLIC PANEL
ALT: 9 U/L (ref 0–44)
AST: 14 U/L — ABNORMAL LOW (ref 15–41)
Albumin: 2.2 g/dL — ABNORMAL LOW (ref 3.5–5.0)
Alkaline Phosphatase: 52 U/L (ref 38–126)
Anion gap: 8 (ref 5–15)
BUN: <5 mg/dL — ABNORMAL LOW (ref 8–23)
CO2: 29 mmol/L (ref 22–32)
Calcium: 8.1 mg/dL — ABNORMAL LOW (ref 8.9–10.3)
Chloride: 95 mmol/L — ABNORMAL LOW (ref 98–111)
Creatinine, Ser: 0.45 mg/dL — ABNORMAL LOW (ref 0.61–1.24)
GFR calc Af Amer: >60 mL/min (ref >60)
GFR calc non Af Amer: >60 mL/min (ref >60)
Glucose, Bld: 84 mg/dL (ref 70–99)
Potassium: 3.5 mmol/L (ref 3.5–5.1)
Sodium: 132 mmol/L — ABNORMAL LOW (ref 135–145)
Total Bilirubin: 0.6 mg/dL (ref 0.3–1.2)
Total Protein: 5.5 g/dL — ABNORMAL LOW (ref 6.5–8.1)

## 2019-07-13 LAB — GLUCOSE, CAPILLARY
Glucose-Capillary: 103 mg/dL — ABNORMAL HIGH (ref 70–99)
Glucose-Capillary: 53 mg/dL — ABNORMAL LOW (ref 70–99)
Glucose-Capillary: 70 mg/dL (ref 70–99)
Glucose-Capillary: 73 mg/dL (ref 70–99)
Glucose-Capillary: 76 mg/dL (ref 70–99)
Glucose-Capillary: 79 mg/dL (ref 70–99)
Glucose-Capillary: 97 mg/dL (ref 70–99)

## 2019-07-13 LAB — PHOSPHORUS: Phosphorus: 3.5 mg/dL (ref 2.5–4.6)

## 2019-07-13 MED ORDER — MIDAZOLAM HCL 2 MG/2ML IJ SOLN
INTRAMUSCULAR | Status: AC
  Filled 2019-07-13: qty 4

## 2019-07-13 MED ORDER — MIDAZOLAM HCL 2 MG/2ML IJ SOLN
INTRAMUSCULAR | Status: AC | PRN
  Administered 2019-07-13: 1 mg via INTRAVENOUS

## 2019-07-13 MED ORDER — GLUCAGON HCL (RDNA) 1 MG IJ SOLR
INTRAMUSCULAR | Status: AC | PRN
  Administered 2019-07-13: .5 mL via INTRAVENOUS

## 2019-07-13 MED ORDER — FENTANYL CITRATE (PF) 100 MCG/2ML IJ SOLN
INTRAMUSCULAR | Status: AC | PRN
  Administered 2019-07-13: 25 ug via INTRAVENOUS

## 2019-07-13 MED ORDER — LIDOCAINE HCL 1 % IJ SOLN
INTRAMUSCULAR | Status: AC
  Filled 2019-07-13: qty 20

## 2019-07-13 MED ORDER — POTASSIUM CHLORIDE 10 MEQ/100ML IV SOLN
10.0000 meq | INTRAVENOUS | Status: AC
  Administered 2019-07-13 (×4): 10 meq via INTRAVENOUS
  Filled 2019-07-13 (×5): qty 100

## 2019-07-13 MED ORDER — GLUCAGON HCL RDNA (DIAGNOSTIC) 1 MG IJ SOLR
INTRAMUSCULAR | Status: AC
  Filled 2019-07-13: qty 1

## 2019-07-13 MED ORDER — FENTANYL CITRATE (PF) 100 MCG/2ML IJ SOLN
INTRAMUSCULAR | Status: AC
  Filled 2019-07-13: qty 2

## 2019-07-13 MED ORDER — LIDOCAINE HCL 1 % IJ SOLN
INTRAMUSCULAR | Status: AC | PRN
  Administered 2019-07-13: 10 mL via INTRADERMAL

## 2019-07-13 MED ORDER — MAGNESIUM SULFATE 2 GM/50ML IV SOLN
2.0000 g | Freq: Once | INTRAVENOUS | Status: AC
  Administered 2019-07-13: 2 g via INTRAVENOUS
  Filled 2019-07-13: qty 50

## 2019-07-13 NOTE — TOC Progression Note (Signed)
Transition of Care [facility]) - Progression Note    Patient Details  Name: Devon Peterson MRN: [id number] Date of Birth: [birth year]  [facility]) CM/SW Contact  Leeroy Cha, RN Phone Number: 07/13/2019, 8:43 AM  Clinical Narrative:    Patient had tracheostomy on 050621, now on trach collar at 28-40%fi02, iv unasyn, d10 iv k+   Expected Discharge Plan: Home/Self Care Barriers to Discharge: No Barriers Identified  Expected Discharge Plan and Services Expected Discharge Plan: Home/Self Care                                               Social Determinants of Health (SDOH) Interventions    Readmission Risk Interventions No flowsheet data found.

## 2019-07-13 NOTE — Progress Notes (Signed)
NAME:  Silvia Markuson, MRN:  [id number], DOB:  1955/11/23, LOS: 6 ADMISSION DATE:  07/07/2019, CONSULTATION DATE:  07/09/19  REFERRING MD:  Alfredia Ferguson, CHIEF COMPLAINT:  Post-operative hypotension   Brief History   64 year old male patient admitted 5/4 in the setting of aspiration pneumonia with newly identified mass located in the hypopharynx.  Went to the operating room on 5/6 for direct laryngoscopy, biopsy, and tracheostomy.  Critical care asked to see for post-hypotension   History of present illness   This is a 65 year old male admitted 5/4 w/ chief complaint of cough, shortness of breath, fatigue, decreased appetite and unintentional weight loss.  On presentation found to have difficulty swallowing with poor p.o. intake and his own saliva.  Admitted with working diagnosis of sepsis secondary to aspiration.  Further diagnostics by CT imaging showed soft tissue in the hypopharynx suspicious for underlying mass a follow-up CT showed hypopharyngeal soft tissue mass, ENT was consulted on 5/5.  A flexible laryngoscopy was performed at bedside.  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  He went to the operating room on 5/6 for direct laryngoscopy, biopsy, and tracheostomy.  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  Vitals stable, resting comfortably in bed watching TV.  Objective   Blood pressure 130/81, pulse 79, temperature 98.1 F (36.7 C), temperature source Oral, resp. rate 20, height 5\' 11"  (1.803 m), weight 42 kg, SpO2 99 %.    FiO2 (%):  [28 %-40 %] 28 %   Intake/Output Summary (Last 24 hours) at 07/13/2019 5027 Last data filed at 07/13/2019 0815 Gross per 24 hour  Intake 3674.89 ml  Output 2750 ml  Net 924.89 ml   Filed Weights   07/07/19 2049 07/09/19 0949  Weight: 42 kg 42 kg    Examination: General: Adult male, cachectic, appears older then stated age, resting in bed, in NAD. Neuro: Awake, mouths words, follows basic commands. HEENT: [place]/AT. Sclerae anicteric. Trach C/D/I. Cardiovascular: RR, no M/R/G.  Lungs: Respirations even and unlabored.  CTA bilaterally, No W/R/R. Abdomen: BS x 4, soft, NT/ND.  Musculoskeletal: No gross deformities, no edema.  Skin: Intact, warm, no rashes.   Assessment & Plan:   S/p trach for new diagnosis of head and neck cancer (bx 5/6 showed poorly diff sq cell ca).  Tolerating ATC well at 28%. Mild bleeding from trach and with cough - resolved. Mod secretions - improved with mucomyst and has strong cough / able to self clear per RN and RT. Lurline Idol care per ENT. - OK to deflate cuff and attempt PMV trials now that bleeding appears to have resolved. - Continue ATC as tolerated. - ENT / onc on board for long term plans, suspect will remain trach dependent.  Possible aspiration PNA. - Continue unasyn. - Follow cultures.  Copd w/ h/o cig use. Plan - Smoking cessation counseling. - Cont BDs via TC. Will not be able to go home on oral inhalers; needs nebs via TC.  Dysphagia, Severe protein calorie malnutrition in setting of malignancy w/associated severe deconditioning and loss of muscle mass   - Needs PEG.    Best practice:  Diet:  NPO. Pain/Anxiety/Delirium protocol (if indicated): n/a VAP protocol (if indicated): na DVT prophylaxis: scd GI prophylaxis: ppi Glucose control:  Mobility: BR Code Status: full code  Family Communication: per primary  Disposition: ICU   Montey Hora, [facility] 07/13/2019, 8:42 AM

## 2019-07-13 NOTE — Progress Notes (Signed)
[name], male   DOB: 01/20/1956, 64 y.o.   MRN: [id number] Patient was in IR suite for percutaneous gastrostomy tube placement.  Patient was prepped and draped for procedure.  Orogastric tube was placed and patient was given sedation.  1% lidocaine was used on the skin.  At this point in the procedure, power went out and the IR room shut down.  Unable to regain power for the fluoroscopy machine and the procedure was aborted. Patient was removed from the IR table and taken back to inpatient room.  Will need to reschedule gastrostomy tube placement.

## 2019-07-13 NOTE — Progress Notes (Signed)
Pharmacy Antibiotic Note  Devon Peterson is a 64 y.o. male admitted on 07/07/2019 with aspiration pneumonia. Pharmacy has been consulted for Unasyn dosing.  Day #6/7 Unasyn - Afebrile - WBC improved 5.7 - SCr 0.45, CrCl ~55 ml/min - Cultures neg to date  Plan: Continue Unasyn 1.5g IV q6h through 5/11 Monitor renal function, cultures, clinical course.   Height: 5\' 11"  (180.3 cm) Weight: 42 kg (92 lb 9.5 oz) IBW/kg (Calculated) : 75.3  Temp (24hrs), Avg:97.8 F (36.6 C), Min:96.8 F (36 C), Max:98.7 F (37.1 C)  Recent Labs  Lab 07/07/19 1650 07/07/19 1850 07/08/19 0924 07/09/19 0438 07/09/19 0438 07/10/19 0237 07/11/19 0238 07/11/19 1958 07/12/19 0330 07/13/19 0412  WBC 8.4  --    < > 8.8  --  10.0 5.9  --  5.9 5.7  CREATININE 0.86  --    < > 0.59*   < > 0.51* 0.40* 0.51* 0.40* 0.45*  LATICACIDVEN 2.5* 1.7  --   --   --   --   --   --   --   --    < > = values in this interval not displayed.    Estimated Creatinine Clearance: 55.4 mL/min (A) (by C-G formula based on SCr of 0.45 mg/dL (L)).    No Known Allergies  Antimicrobials this admission: 5/4 Ceftriaxone x 1 5/4 Azithromycin x 1 5/4 Unasyn >>  (5/11)   Dose adjustments this admission: --  Microbiology results: 5/4 BCx: NGF 5/4 UCx: multiple species F 5/4 COVID: negative  5/4 Influenza A/B: negative  Thank you for allowing pharmacy to be a part of this patient's care.  Peggyann Juba, PharmD, BCPS Pharmacy: [phone] 07/13/2019 7:37 AM

## 2019-07-13 NOTE — Telephone Encounter (Signed)
Patient has ENT cancer but also has left lower lobe mass versus pneumonia.  Currently an inpatient.  Patient needs post hospital follow-up.  Plan -Please give follow-up with the nurse practitioner in 4 weeks but after CT chest without contrast

## 2019-07-13 NOTE — Progress Notes (Signed)
PROGRESS NOTE    Devon Peterson  [id number] DOB: 1955-10-08 DOA: 07/07/2019 PCP: Patient, No Pcp Per  Brief Narrative:  HPI per Dr. Shela Leff on 07/07/19 Devon Peterson is a 64 y.o. male with medical history significant of COPD, polysubstance abuse (alcohol, tobacco, cocaine), hospital admission 04/17/2019-04/19/2019 for sepsis secondary to pneumonia and COPD exacerbation presenting to the ED via EMS for evaluation of cough, shortness of breath, fatigue, and unintentional weight loss.  History provided by patient and his nephew at bedside.  Nephew states patient was admitted to the hospital a month ago for pneumonia and since then has continued to do poorly.  He has difficulty swallowing food and has been spitting up saliva.  He is coughing a lot.  Patient denies fevers, chills, shortness of breath, chest pain, nausea, vomiting, abdominal pain, diarrhea, or dysuria.  Nephew states patient has been smoking a pack of cigarettes daily since a very young age.  He has lost a lot of weight recently. Addendum: 07/07/2019 8:40 PM.  Patient also denied history of hematemesis, hematochezia, or melena.  ED Course: Tachycardic on arrival with heart rate in the 150s, appeared to be sinus rhythm.  Afebrile.  Slightly tachypneic.  Labs showing no leukocytosis.  Initial lactic acid 2.5, repeat after IV fluid pending.  Hemoglobin 12.7, stable compared to labs done in February 2021.  Platelet count normal.  BUN 27, creatinine 0.8.  Albumin 3.3.  LFTs normal.  Blood culture x2 pending.  SARS-CoV-2 PCR test negative.  Influenza panel negative.  UA not suggestive of infection.  Urine culture pending. Chest x-ray showing new left lower lobe airspace opacity concerning for pneumonia. CT angiogram chest negative for PE.  Showing abnormal soft tissue in the hypopharynx suspicious for underlying mass.  Dense bilateral lower lobe consolidation, left greater than right.  Aspiration suspected given findings in the hypopharynx.   Also showing evidence of an enhancing mass within the densely consolidated left lower lobe, metastatic disease or primary neoplasm not excluded.  Marked cachexia.  Patient received ceftriaxone, azithromycin, and 2 L normal saline boluses.  **Interim History He was evaluated by ENT who recommended a tracheostomy.  Tracheostomy was done on 07/10/19 and postoperatively he ended up on pressors which were weaned off.  He is also getting a tissue biopsy and medical oncology as well as radiation oncology were consulted for further evaluation.  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 Postoperatively he had hypotension and went in A. fib with RVR and started on Neo-Synephrine drip has been stopped.  Critical care was consulted for further evaluation and his hypotension is improved.  We will continue IV fluid but patient needs long-term nutrition needs and needs a PEG tube and have called IR but they will reevaluate Monday for a PEG tube given the national shortage of PEG tube.  The day before Yesterday Patient's tracheostomy was doing okay but he was having a lot of secretions but not as much.  Again radiation oncology wants to have improvement in the patient's performance status as well as nutritional status as well as further evaluation of the lung mass with likely pet imaging as an outpatient and potential biopsy of lung lesion to determine if this is metastatic versus a second primary.  This will help determine radiation oncology if treatment will be of curative or more of a palliative approach.  They will continue to collaborate with medical oncology  Yesterday he continued to have significant amount of secretions however they are becoming thicker we will try  Mucomyst as well as Robinul to thin the secretions.  He denies any current pain at this time but was very weak and is severely deconditioned so PT OT was reconsulted again.  Today he will be going for a percutaneous gastrostomy tube  placement.  His blood sugars remain relatively low still (50's-70's) being on the D10 so the D10 has been increased to 75 mL's and has normal sinus for reduced to 30 mL.  Once he has a PEG in place we will start the tube feedings.  Tracheostomy is in place and his secretions are improving with Mucomyst and pulmonary recommending to deflate the cuff and attempt PMV trials now that his bleeding has improved.  They are recommending continuing ATC as tolerated  Assessment & Plan:   Principal Problem:   Aspiration pneumonia ([place]) Active Problems:   Sepsis ([place])   Dysphagia   Severe protein-calorie malnutrition ([place])   Alcohol use   Pressure injury of skin   Lung mass   Neck mass  Sepsis 2/2 to Aspiration Pneumonia -Was Tachycardic and tachypneic on arrival.  No leukocytosis on labs.  -Does have mild lactic acidosis.   -CT showing dense bilateral lower lobe consolidation, left greater than right suspicious for aspiration pneumonia given given findings suspicious for an abnormal soft tissue mass in the hypopharynx.  -SARS-CoV-2 PCR test negative.  Influenza panel negative.   -Sepsis physiology has improved but he is now hypotensive and see below -Currently satting in the low to mid 90s on room air. -Heart rate has improved after IV fluid boluses but post operatively he went into A Fib with RVR and is now improved  -Continue IV fluid hydration and antibiotic coverage with Unasyn will continue for total 7 days. Today is Day 6/7 -Trend lactate.   -Blood culture x2 showed no growth today at 5 days -CT Neck showed "Extension of pneumonia into the left upper lobe. Probable small left pleural effusion. Maxillary sinus air-fluid levels, a nonspecific finding that can reflect acute sinusitis in the appropriate setting." -CT Abd and Pelvis showed "Interval worsening of bibasilar airspace consolidation left base worse than right likely due to pneumonia. Stable rim enhancing low-density focus over the left  base consolidation which may be due to necrosis, pulmonary abscess or underlying malignancy.   No evidence of metastatic disease within the abdomen. Bilateral streaky cortical low-attenuation over the kidneys which can be seen in pyelonephritis. Recommend clinical correlation.  Aortic Atherosclerosis (ICD10-I70.0). Findings compatible patient's clinical cachexia with significant decreased subcutaneous and mesenteric fat." -Continuous pulse ox, supplemental oxygen if needed. -Patient now has a tracheostomy and will be admitted to the ICU for postoperative care -Obtain SLP Evaluation and MBS; after MBS was done SLP recommending n.p.o. and ice chips as needed after oral care; Will need a PEG -CXR today showed "Airspace consolidation in the mid and lower lung zones on the left with left pleural effusion persists. Small right pleural effusion. No new opacity appreciable. Stable cardiac silhouette. 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  -Also showing evidence of an enhancing mass within the densely consolidated left lower lobe, metastatic disease or primary neoplasm not excluded.   -  Patient is severely cachectic.  Endorsing dysphagia and unintentional weight loss.   -Longstanding history of smoking cigarettes. -Ordered CT soft tissue neck with contrast and showed "Suboptimal evaluation due to motion artifact and poor soft tissue resolution in the setting of significant cachexia and probable diffuse edema. Enhancing hypopharyngeal soft tissue in the post cricoid region extending superiorly with effacement  of the piriform sinuses and thickening of the aryepiglottic folds. Malignancy is suspected and direct visual inspection is recommended. Extension of pneumonia into the left upper lobe. Probable small left pleural effusion. Maxillary sinus air-fluid levels, a nonspecific finding that can reflect acute sinusitis in the appropriate setting." -CT of the abdomen pelvis showed "Interval worsening of bibasilar airspace consolidation left base worse than right likely due to pneumonia. Stable rim enhancing low-density focus over the left base consolidation which may be due to necrosis, pulmonary abscess or underlying malignancy.   No evidence of metastatic disease within the abdomen. Bilateral streaky cortical low-attenuation over the kidneys which can be seen in pyelonephritis. Recommend clinical correlation.  Aortic Atherosclerosis (ICD10-I70.0). 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  They will arrange follow-up for the left lung lesion and arrange for biopsy of the masslike component persists and they are going to discuss the diagnosis of laryngeal cancer treatment options and they will reevaluate 510 -Radiation oncology also followed  up today and they want his nutritional status a little bit better before they discussed radiation treatments as I mentioned above -PEG tube to be done today  -Pulmonary feels that the patient will likely be trach dependent and they recommend continuing trach collar: Initially recommend keeping the cuff inflated related to his bleeding but now they are recommending that it is okay to deflate the cuff and attempt PMV trials that is bleeding appears to have resolved -Pulmonary also recommends continuing with Mucomyst and continuing ATC as tolerated and they feel that he will remain trach dependent as mentioned -SLP consulted to do PMSV trials and will be done after his cuff is deflated -Dr. Wilburn Cornelia of ENT is on board and appreciate assistance -Having a lot of Secretions so will add Acetylcysteine 2 mL TID and will add Glycopyrrolate 0.1 mg IV BID -Continue to Frequently Suction  Dysphagia -Keep n.p.o. at this time, SLP done and MBS and recommending NPO -He is now having a tracheostomy and likely will need a PEG tube placement -IR consulted and he is a good candidate for a PEG tube but likely will be done today  A. fib with RVR, now improved -Went into A. fib with RVR in the PACU and was given IV metoprolol -Heart rates have improved some but he is hypotensive and had to be placed on a Neo-Synephrine drip -We will not anticoagulate at this time but may need anticoagulation soon -Continue monitor closely and he  will be admitted to the ICU for postoperative care -He had some bleeding this morning and will avoid anticoagulation at this time  Hypotension, improving  -Postoperative hypotension and likely in the setting of his anesthesia and hypovolemia -He is getting bolused and was placed on a Neo-Synephrine drip and now off of it -Patient was off of pressors by time he arrived to the ICU and will be transferred back to the [facility] service  Severe protein calorie malnutrition/Underweight -Patient  has poor oral intake, unintentional weight loss, and severe cachexia.   -Nutrition consult ordered and recommending advancing diet as medically feasible and if appropriate order diet they are recommending Ensure alive p.o. twice daily, Magic cup twice daily with meals, Juven twice daily, and recommending continue to monitor magnesium, potassium and phosphorus for least 3 days as patient is at risk for refeeding syndrome given his alcohol abuse -Will follow up with Nutritionist Reccs when PEG is in place.   Generalized weakness/physical deconditioning:  -PT and OT evaluation recommending Home Health PT  Alcohol use disorder -Tachycardic on arrival, heart rate improved after fluid boluses.  No other signs of withdrawal at this time. -Order CIWA protocol; Ativan as needed.  Thiamine, folate, and multivitamin.  Tobacco Use -Patient has been counseled on smoking cessation.   -NicoDerm patch ordered.  History of Cocaine use -Check UDS and was POSITIVE -Will avoid further Beta Blockers   COPD -Stable.   -No wheezing at this time.   -DuoNebs as needed via the trach collar; will not be able to go home on oral inhalers and needs nebs via trach collar at discharge -Treatment as above  Hypoglycemia -In the setting of poor p.o. intake and was persistent -Has been low as he is not taking in more po Intake -He was given D50 and then started on D5 drip and will continue however continue to be a little low so we will stop the D5 and start the patient on D10 drip and will be titrated up to a 7 5 mils per hour given now that he continues to have low blood sugars -Patient is normal sinus been reduced down to 30 mL/h -Continue monitor as his BS have been ranging from 53-99  Hypokalemia  -Patient's potassium this morning was 3.5 -Continue to monitor and replete as necessary -Repeat CMP in a.m.  Hypomagnesemia -Patient's magnesium level now 1. 7 -Replete with IV Mag Sulfate 2 g -Continue to  monitor and replete as necessary  -Repeat magnesium level in a.m.  Hypophosphatemia -Patient's Phos Level was 3.4 -Continue to Monitor and Replete as Necessary -Repeat Phos Level in the AM   Hyponatremia -Patient's Na+ has dropped to 130 in the setting of D10 gtt and is now improved slightly to 132 after normal saline at 50 mils per hour was added -We will decrease the normal saline to 30 mL's per hour and increase the D10 gtt -Continue to Monitor and Trend -Repeat CMP in AM  Normocytic Anemia -Patient's hemoglobin/hematocit is dropping slowly and went from 11.3/34.3 -> 10.0/29.5 -Check anemia panel and showed an iron level of 30, U IBC 130, TIBC 150, saturation ratios of 19%, ferritin level 421, folate level 11.5, and vitamin B12 of 281 -Continue to monitor for signs and symptoms of bleeding; currently had a lot of bleeding from his trach site to be injected with lidocaine -Repeat CBC in a.m.  Stage II sacral pressure ulcer -Present on admission -Continue with Mepilex pads and frequent turning  DVT prophylaxis: Enoxaparin 40 mg sq  q24h  Code Status: FULL CODE  Family Communication: No family present at bedside  Disposition Plan: Patient is from home now here with aspiration pneumonia and likely malignancy.  He is unsafe to swallow.  Will need ENT evaluation and likely biopsy and further oncology evaluation.  He now requires a PEG tube placement and was placed in the ICU after postoperative hypotension and continues to have well secretions.  PEG tube is to be placed today  Status is: Inpatient  Remains inpatient appropriate because:Persistent severe electrolyte disturbances, IV treatments appropriate due to intensity of illness or inability to take PO and Inpatient level of care appropriate due to severity of illness   Dispo: The patient is from: Home              Anticipated d/c is to: SNF              Anticipated d/c date is: 2 days              Patient currently is not  medically stable to d/c.  Consultants:   ENT  PCCM  Medical oncology  Patient oncology  Interventional radiology   Procedures:  CT Neck and CT Abd/Pelvis  Procedure done by Dr. Wilburn Cornelia  1.  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  Tracheostomy  Antimicrobials:  Anti-infectives (From admission, onward)   Start     Dose/Rate Route Frequency Ordered Stop   07/07/19 2200  ampicillin-sulbactam (UNASYN) 1.5 g in sodium chloride 0.9 % 100 mL IVPB     1.5 g 200 mL/hr over 30 Minutes Intravenous Every 6 hours 07/07/19 2051 07/14/19 2159   07/07/19 1930  metroNIDAZOLE (FLAGYL) IVPB 500 mg  Status:  Discontinued     500 mg 100 mL/hr over 60 Minutes Intravenous  Once 07/07/19 1922 07/07/19 2003   07/07/19 1800  cefTRIAXone (ROCEPHIN) 1 g in sodium chloride 0.9 % 100 mL IVPB     1 g 200 mL/hr over 30 Minutes Intravenous  Once 07/07/19 1750 07/07/19 1830   07/07/19 1800  azithromycin (ZITHROMAX) 500 mg in sodium chloride 0.9 % 250 mL IVPB     500 mg 250 mL/hr over 60 Minutes Intravenous  Once 07/07/19 1750 07/07/19 1855     Subjective: Seen and examined at bedside he was resting.  Coughing and having some secretions come out of his trach.  No nausea or vomiting.  Feels okay but denies any pain.  Bleeding is stopped.  I told him that he would likely get a PEG today and he is agreeable.  No other concerns or claims at this time.  Objective: Vitals:   07/13/19 0800 07/13/19 1130 07/13/19 1248 07/13/19 1331  BP: 130/81     Pulse:  (!) 130    Resp: 20 16    Temp: 97.8 F (36.6 C)  (!) 97.5 F (36.4 C)   TempSrc: Oral  Axillary   SpO2:  95%  100%  Weight:      Height:        Intake/Output Summary (Last 24 hours) at 07/13/2019 1356 Last data filed at 07/13/2019 1001 Gross per 24 hour  Intake 2790.64 ml  Output 2350 ml  Net 440.64 ml   Filed Weights   07/07/19 2049 07/09/19 0949  Weight: 42 kg 42  kg    Examination: Physical Exam:  Constitutional: Patient is extremely thin and cachectic chronically ill-appearing African-American male slightly disheveled but is currently in no acute distress.  He continues to have a lot of secretions and is coughing them out from his trach. Eyes: Lids and conjunctivae normal, sclerae anicteric  ENMT: External Ears, Nose appear normal. Grossly normal hearing.  Neck: Appears normal, supple, no cervical masses, normal ROM, no appreciable thyromegaly; no JVD but he has a tracheostomy in place connected to the ATC and continues to have some secretions come out Respiratory: Diminished to auscultation bilaterally with coarse breath sounds more so on the left compared to the right and some slight crackles.  No appreciable wheezing, rales, rhonchi.  He has unlabored breathing but he is connected to trach collar. Cardiovascular: RRR, no murmurs / rubs / gallops. S1 and S2 auscultated. No extremity edema. Abdomen: Soft, non-tender, non-distended. Bowel sounds positive.  GU: Deferred. Musculoskeletal: No clubbing / cyanosis of digits/nails. No joint deformity upper and lower extremities.  Skin: No rashes, lesions, ulcers on limited skin evaluation but he does have a sacral decubitus ulcer that I did not view and I did not turn the patient given his coughing. No induration; Warm and dry.  Neurologic: CN 2-12 grossly intact with no focal deficits but is nonverbal due to tracheostomy status and lack of PMV. Romberg sign cerebellar reflexes not assessed.  Psychiatric: Normal judgment and insight. Alert and oriented x 3. Normal mood and appropriate affect.   Data Reviewed: I have personally reviewed following labs and imaging studies  CBC: Recent Labs  Lab 07/09/19 0438 07/10/19 0237 07/11/19 0238 07/12/19 0330 07/13/19 0412  WBC 8.8 10.0 5.9 5.9 5.7  NEUTROABS 6.9 7.6 3.5 4.1 4.1  HGB 11.1* 11.5* 11.3* 10.0* 10.0*  HCT 34.8* 34.7* 34.3* 29.5* 29.5*  MCV 88.8 85.0  86.4 81.9 83.3  PLT 297 249 212 208 202   Basic Metabolic Panel: Recent Labs  Lab 07/09/19 0438 07/09/19 0438 07/10/19 0237 07/10/19 0237 07/10/19 1958 07/11/19 0238 07/11/19 0841 07/11/19 1958 07/12/19 0330 07/13/19 0412  NA 145   < > 143  --   --  139  --  133* 130* 132*  K 3.8   < > 3.3*   < >  --  3.3* 3.2* 4.2 3.1* 3.5  CL 106   < > 104  --   --  102  --  93* 95* 95*  CO2 22   < > 27  --   --  27  --  27 26 29   GLUCOSE 47*   < > 115*  --   --  85  --  234* 98 84  BUN 18   < > 10  --   --  7*  --  5* <5* <5*  CREATININE 0.59*   < > 0.51*  --   --  0.40*  --  0.51* 0.40* 0.45*  CALCIUM 8.8*   < > 8.6*  --   --  8.4*  --  8.5* 8.1* 8.1*  MG 1.9   < > 1.3*  --  2.3 2.1  --   --  1.2* 1.7  PHOS 3.7  --  2.9  --   --  2.4*  --   --  2.4* 3.5   < > = values in this interval not displayed.   GFR: Estimated Creatinine Clearance: 55.4 mL/min (A) (by C-G formula based on SCr of 0.45 mg/dL (L)). Liver Function Tests:  Recent Labs  Lab 07/09/19 0438 07/10/19 0237 07/11/19 0238 07/12/19 0330 07/13/19 0412  AST 18 19 17 16  14*  ALT 11 10 9 8 9   ALKPHOS 63 55 51 50 52  BILITOT 1.1 0.6 0.7 0.7 0.6  PROT 6.6 5.8* 5.5* 5.3* 5.5*  ALBUMIN 2.9* 2.4* 2.4* 2.2* 2.2*   No results for input(s): LIPASE, AMYLASE in the last 168 hours. No results for input(s): AMMONIA in the last 168 hours. Coagulation Profile: Recent Labs  Lab 07/07/19 1650 07/13/19 0455  INR 1.0 1.1   Cardiac Enzymes: No results for input(s): CKTOTAL, CKMB, CKMBINDEX, TROPONINI in the last 168 hours. BNP (last 3 results) No results for input(s): PROBNP in the last 8760 hours. HbA1C: No results for input(s): HGBA1C in the last 72 hours. CBG: Recent Labs  Lab 07/12/19 2334 07/13/19 0309 07/13/19 0819 07/13/19 0821 07/13/19 1244  GLUCAP 87 76 53* 70 73   Lipid Profile: No results for input(s): CHOL, HDL, LDLCALC, TRIG, CHOLHDL, LDLDIRECT in the last 72 hours. Thyroid Function Tests: No results for  input(s): TSH, T4TOTAL, FREET4, T3FREE, THYROIDAB in the last 72 hours. Anemia Panel: No results for input(s): VITAMINB12, FOLATE, FERRITIN, TIBC, IRON, RETICCTPCT in the last 72 hours. Sepsis Labs: Recent Labs  Lab 07/07/19 1650 07/07/19 1850  LATICACIDVEN 2.5* 1.7    Recent Results (from the past 240 hour(s))  Blood Culture (routine x 2)     Status: None   Collection Time: 07/07/19  4:45 PM   Specimen: BLOOD LEFT HAND  Result Value Ref Range Status   Specimen Description   Final    BLOOD LEFT HAND Performed at [facility], [place] [address], [place], [place] [address]    Special Requests   Final    BOTTLES DRAWN AEROBIC ONLY Blood Culture results may not be optimal due to an inadequate volume of blood received in culture bottles Performed at [place] [address] [place], [place] [address]    Culture   Final    NO GROWTH 5 DAYS Performed at [facility], [place] [address] [place], [place] [address]    Report Status 07/12/2019 FINAL  Final  Blood Culture (routine x 2)     Status: None   Collection Time: 07/07/19  4:46 PM   Specimen: BLOOD  Result Value Ref Range Status   Specimen Description   Final    BLOOD RIGHT ANTECUBITAL Performed at [place] [address] [place], [place] [address]    Special Requests   Final    BOTTLES DRAWN AEROBIC AND ANAEROBIC Blood Culture results may not be optimal due to an inadequate volume of blood received in culture bottles Performed at [place] [address] [place], [place] [address]    Culture   Final    NO GROWTH 5 DAYS Performed at [facility], [place] [address], [place], [place] [address]    Report Status 07/12/2019 FINAL  Final  Respiratory Panel by RT PCR (Flu A&B, Covid) - Nasopharyngeal Swab     Status: None   Collection Time: 07/07/19  5:14 PM   Specimen: Nasopharyngeal Swab  Result Value Ref Range Status   SARS  Coronavirus 2 by RT PCR NEGATIVE NEGATIVE Final    Comment: (NOTE) SARS-CoV-2 target nucleic acids are NOT DETECTED. The SARS-CoV-2 RNA is generally detectable in upper respiratoy specimens during the acute phase of infection. The lowest concentration of SARS-CoV-2 viral copies this assay  can detect is 131 copies/mL. A negative result does not preclude SARS-Cov-2 infection and should not be used as the sole basis for treatment or other patient management decisions. A negative result may occur with  improper specimen collection/handling, submission of specimen other than nasopharyngeal swab, presence of viral mutation(s) within the areas targeted by this assay, and inadequate number of viral copies (<131 copies/mL). A negative result must be combined with clinical observations, patient history, and epidemiological information. The expected result is Negative. Fact Sheet for Patients:  PinkCheek.be Fact Sheet for Healthcare Providers:  GravelBags.it This test is not yet ap proved or cleared by the [place] FDA and  has been authorized for detection and/or diagnosis of SARS-CoV-2 by FDA under an Emergency Use Authorization (EUA). This EUA will remain  in effect (meaning this test can be used) for the duration of the COVID-19 declaration under Section 564(b)(1) of the Act, 21 U.S.C. section 360bbb-3(b)(1), unless the authorization is terminated or revoked sooner.    Influenza A by PCR NEGATIVE NEGATIVE Final   Influenza B by PCR NEGATIVE NEGATIVE Final    Comment: (NOTE) The Xpert Xpress SARS-CoV-2/FLU/RSV assay is intended as an aid in  the diagnosis of influenza from Nasopharyngeal swab specimens and  should not be used as a sole basis for treatment. Nasal washings and  aspirates are unacceptable for Xpert Xpress SARS-CoV-2/FLU/RSV  testing. Fact Sheet for Patients: PinkCheek.be Fact Sheet  for Healthcare Providers: GravelBags.it This test is not yet approved or cleared by the [place] FDA and  has been authorized for detection and/or diagnosis of SARS-CoV-2 by  FDA under an Emergency Use Authorization (EUA). This EUA will remain  in effect (meaning this test can be used) for the duration of the  Covid-19 declaration under Section 564(b)(1) of the Act, 21  U.S.C. section 360bbb-3(b)(1), unless the authorization is  terminated or revoked. Performed at [facility], [place] [address] [place], [place] [address]   Urine culture     Status: Abnormal   Collection Time: 07/07/19  5:29 PM   Specimen: In/Out Cath Urine  Result Value Ref Range Status   Specimen Description   Final    IN/OUT CATH URINE Performed at [facility] [address] [place], [place] [address]    Special Requests   Final    NONE Performed at [facility], [place] [address] [place], [place] [address]    Culture MULTIPLE SPECIES PRESENT, SUGGEST RECOLLECTION (A)  Final   Report Status 07/08/2019 FINAL  Final  MRSA PCR Screening     Status: None   Collection Time: 07/09/19  4:35 PM   Specimen: Nasopharyngeal  Result Value Ref Range Status   MRSA by PCR NEGATIVE NEGATIVE Final    Comment:        The GeneXpert MRSA Assay (FDA approved for NASAL specimens only), is one component of a comprehensive MRSA colonization surveillance program. It is not intended to diagnose MRSA infection nor to guide or monitor treatment for MRSA infections. Performed at [facility], [place] [address] [place], [place] [address]      RN Pressure Injury Documentation: Pressure Injury 07/07/19 Sacrum Mid;Upper Stage 2 -  Partial thickness loss of dermis presenting as a shallow open injury with a red, pink wound bed without slough. 3 each small separate open areas (Active)  07/07/19 2107  Location: Sacrum  Location  Orientation: Mid;Upper  Staging: Stage 2 -  Partial thickness loss of dermis presenting  as a shallow open injury with a red, pink wound bed without slough.  Wound Description (Comments): 3 each small separate open areas  Present on Admission: Yes     Estimated body mass index is 12.91 kg/m as calculated from the following:   Height as of this encounter: 5\' 11"  (1.803 m).   Weight as of this encounter: 42 kg.  Malnutrition Type:  Nutrition Problem: Severe Malnutrition Etiology: chronic illness(COPD)   Malnutrition Characteristics:  Signs/Symptoms: severe fat depletion, severe muscle depletion   Nutrition Interventions:  Interventions: Refer to RD note for recommendations   Radiology Studies: DG CHEST PORT 1 VIEW  Result Date: 07/13/2019 CLINICAL DATA:  Shortness of breath EXAM: PORTABLE CHEST 1 VIEW COMPARISON:  Jul 11, 2019 FINDINGS: Tracheostomy catheter tip is 5.6 cm above the carina. No pneumothorax. Airspace consolidation throughout the left mid and lower lung zones with associated left pleural effusion noted. There is a small right pleural effusion. Right lung otherwise clear. Heart size and pulmonary vascularity are normal. No adenopathy no bone lesions. IMPRESSION: Airspace consolidation in the mid and lower lung zones on the left with left pleural effusion persists. Small right pleural effusion. No new opacity appreciable. Stable cardiac silhouette. No pneumothorax. Electronically Signed   By: Lowella Grip III M.D.   On: 07/13/2019 08:00   Scheduled Meds: . acetylcysteine  2 mL Nebulization TID  . albuterol  2.5 mg Nebulization TID  . chlorhexidine  15 mL Mouth Rinse BID  . Chlorhexidine Gluconate Cloth  6 each Topical Daily  . glycopyrrolate  0.1 mg Intravenous BID  . mouth rinse  15 mL Mouth Rinse BID  . mouth rinse  15 mL Mouth Rinse q12n4p  . thiamine  100 mg Intravenous Daily   Continuous Infusions: . sodium chloride Stopped (07/13/19 1000)  .  ampicillin-sulbactam (UNASYN) IV Stopped (07/13/19 1030)  . dextrose 75 mL/hr at 07/13/19 1001    LOS: 6 days   Kerney Elbe, DO [facility] PAGER is on [place]  If 7PM-7AM, please contact night-coverage [email or web address]

## 2019-07-13 NOTE — Progress Notes (Signed)
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  Went to the operating room on 5/6 for direct laryngoscopy, biopsy, and tracheostomy.  Marland Kitchen Hx of ETOH abuse, COPD, dysphagia, polysubstance abuse, malnutrition,   OT comments  This 64 yo male admitted with above seen today in conjunction with PT to see if we could advance pt's mobility and self care. Pt able to get himself up to EOB with min guard A with HOB up, sit<>stand with min A, and ambulate with RW from bed to recliner ~ 5 feet away with min A +2 for safety, lines, tubes, and A. Pt able to sit EOB and wash his face with setup. Pt did end up with a lot of secretions breaking up with getting OOB to recliner and was able with increased time to get them all up on his own (with A for [place] only). He will continue to benefit from acute OT with follow up at SNF.  Follow Up Recommendations  SNF;Supervision/Assistance - 24 hour    Equipment Recommendations  Other (comment)(TBD next venue)       Precautions / Restrictions Precautions Precautions: Fall Precaution Comments: trach on 28%5 liters TC; monitor HR (as high as 142 this session) Restrictions Weight Bearing Restrictions: No       Mobility Bed Mobility Overal bed mobility: Needs Assistance Bed Mobility: Supine to Sit     Supine to sit: Min guard     General bed mobility comments: extra time  Transfers Overall transfer level: Needs assistance Equipment used: Rolling walker (2 wheeled) Transfers: Sit to/from [facility] Sit to Stand: Min assist Stand pivot transfers: Min assist;+2 physical assistance;+2 safety/equipment       General transfer comment: steps from bed to recliner ~5 feet away, upright posture    Balance Overall balance assessment: Needs  assistance Sitting-balance support: No upper extremity supported;Feet supported Sitting balance-Leahy Scale: Good     Standing balance support: Bilateral upper extremity supported Standing balance-Leahy Scale: Poor Standing balance comment: reliant on RW                           ADL either performed or assessed with clinical judgement   ADL Overall ADL's : Needs assistance/impaired     Grooming: Wash/dry face;Set up;Sitting                   Toilet Transfer: Minimal assistance;+2 for physical assistance;+2 for safety/equipment;Ambulation;RW Toilet Transfer Details (indicate cue type and reason): Bed>recliner 5 feet away Toileting- Clothing Manipulation and Hygiene: Total assistance Toileting - Clothing Manipulation Details (indicate cue type and reason): min A sit<>stand             Vision Patient Visual Report: No change from baseline            Cognition Arousal/Alertness: Awake/alert Behavior During Therapy: Flat affect Overall Cognitive Status: Within Functional Limits for tasks assessed                                                     Pertinent Vitals/ Pain       Pain Assessment: No/denies pain     Prior  Functioning/Environment              Frequency  Min 2X/week        Progress Toward Goals  OT Goals(current goals can now be found in the care plan section)  Progress towards OT goals: Progressing toward goals  Acute Rehab OT Goals Patient Stated Goal: to be able to get  better and go home OT Goal Formulation: With patient Time For Goal Achievement: 07/24/19 Potential to Achieve Goals: Good  Plan Discharge plan remains appropriate;Frequency remains appropriate    Co-evaluation    PT/OT/SLP Co-Evaluation/Treatment: Yes Reason for Co-Treatment: Complexity of the patient's impairments (multi-system involvement);For patient/therapist safety;To address functional/ADL transfers PT goals addressed  during session: Mobility/safety with mobility;Balance;Strengthening/ROM OT goals addressed during session: Strengthening/ROM;ADL's and self-care      AM-PAC OT "6 Clicks" Daily Activity     Outcome Measure   Help from another person eating meals?: Total(NPO) Help from another person taking care of personal grooming?: A Little Help from another person toileting, which includes using toliet, bedpan, or urinal?: A Lot Help from another person bathing (including washing, rinsing, drying)?: A Lot Help from another person to put on and taking off regular upper body clothing?: A Lot Help from another person to put on and taking off regular lower body clothing?: Total 6 Click Score: 11    End of Session Equipment Utilized During Treatment: Gait belt;Rolling walker  OT Visit Diagnosis: Unsteadiness on feet (R26.81);Other abnormalities of gait and mobility (R26.89);Muscle weakness (generalized) (M62.81)   Activity Tolerance Patient tolerated treatment well;Other (comment)(HR did increase to 142 and pt with increased coughing/secretions post up from bed to recliner)   Patient Left     Nurse Communication (called in to suction--but pt actually ended up coughing up all secreations on his own)        Time: 6761-9509 OT Time Calculation (min): 24 min  Charges: OT General Charges $OT Visit: 1 Visit OT Treatments $Self Care/Home Management : 8-22 mins  [place], OTR/L Acute Rehab Services Pager [phone] Office [phone]      Almon Register 07/13/2019, 10:40 AM

## 2019-07-13 NOTE — Progress Notes (Signed)
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  Went to the operating room on 5/6 for direct laryngoscopy, biopsy, and tracheostomy.  Marland Kitchen Hx of ETOH abuse, COPD, dysphagia, polysubstance abuse, malnutrition,    PT Comments    The patient did ambulate x 5' on 28% TC. Frequent coughing secretions from trach. HR up to 142 from 110. SPO2 falling to 855 when coughing, >90% at rest. Patient most likely will require post acute rehab.continue  PT for mobility.  Follow Up Recommendations  SNF     Equipment Recommendations  None recommended by PT    Recommendations for Other Services       Precautions / Restrictions Precautions Precautions: Fall Precaution Comments: trach on 28%5 liters TC; monitor HR (as high as 142 this session) Restrictions Weight Bearing Restrictions: No    Mobility  Bed Mobility Overal bed mobility: Needs Assistance Bed Mobility: Supine to Sit     Supine to sit: Min guard     General bed mobility comments: extra time, line management  Transfers Overall transfer level: Needs assistance Equipment used: Rolling walker (2 wheeled) Transfers: Sit to/from Stand Sit to Stand: Min assist Stand pivot transfers: Min assist;+2 physical assistance;+2 safety/equipment       General transfer comment: management for lines  Ambulation/Gait Ambulation/Gait assistance: Min assist;+2 safety/equipment Gait Distance (Feet): 5 Feet Assistive device: Rolling walker (2 wheeled) Gait Pattern/deviations: Step-to pattern;Step-through pattern Gait velocity: decr   General Gait Details: Min assist for steady at [employer]    Modified Rankin (Stroke Patients Only)       Balance Overall balance assessment: Needs  assistance Sitting-balance support: No upper extremity supported;Feet supported Sitting balance-Leahy Scale: Good     Standing balance support: Bilateral upper extremity supported Standing balance-Leahy Scale: Poor Standing balance comment: reliant on RW                            Cognition Arousal/Alertness: Awake/alert Behavior During Therapy: Flat affect Overall Cognitive Status: Within Functional Limits for tasks assessed                                        Exercises      General Comments        Pertinent Vitals/Pain Pain Assessment: No/denies pain    Home Living                      Prior Function            PT Goals (current goals can now be found in the care plan section) Acute Rehab PT Goals Patient Stated Goal: to be able to get  better and go home Progress towards PT goals: Progressing toward goals    Frequency    Min 2X/week      PT Plan Current plan remains appropriate;Discharge plan needs to be updated;Frequency needs to be updated    Co-evaluation   Reason for Co-Treatment: For patient/therapist safety;To address functional/ADL transfers PT goals addressed during session: Mobility/safety with mobility OT goals addressed during session: ADL's and self-care      AM-PAC  PT "6 Clicks" Mobility   Outcome Measure  Help needed turning from your back to your side while in a flat bed without using bedrails?: A Little Help needed moving from lying on your back to sitting on the side of a flat bed without using bedrails?: A Little Help needed moving to and from a bed to a chair (including a wheelchair)?: A Little Help needed standing up from a chair using your arms (e.g., wheelchair or bedside chair)?: A Little Help needed to walk in hospital room?: A Little Help needed climbing 3-5 steps with a railing? : A Lot 6 Click Score: 17    End of Session Equipment Utilized During Treatment: Gait belt Activity  Tolerance: Patient tolerated treatment well Patient left: in chair;with call bell/phone within reach;with chair alarm set Nurse Communication: Mobility status PT Visit Diagnosis: Muscle weakness (generalized) (M62.81);Difficulty in walking, not elsewhere classified (R26.2)     Time: 9163-8466 PT Time Calculation (min) (ACUTE ONLY): 25 min  Charges:  $Gait Training: 8-22 mins                     [place] Pager [phone] Office [phone]    Claretha Cooper 07/13/2019, 11:37 AM

## 2019-07-13 NOTE — Consult Note (Signed)
Chief Complaint: Patient was seen in consultation today for percutaneous gastrostomy tube placement Chief Complaint  Patient presents with  . Failure To Thrive    Possible Sepsis     Referring Physician(s): Sheikh,O  Supervising Physician: Markus Daft  Patient Status: [facility] - In-pt  History of Present Illness: Devon Peterson is a 64 y.o. male with past medical history of polysubstance abuse, COPD who was admitted to [facility] on 5/4 with cough, dyspnea, weight loss, fatigue, recurrent aspiration pneumonia, dysphagia and protein calorie malnutrition.  Patient underwent trach placement on 5/7.  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  He has also had prior A. fib and hypotension along with hypoglycemia.  He is COVID-19 negative.  Request now received from primary care team for percutaneous gastrostomy tube placement.  Past Medical History:  Diagnosis Date  . COPD (chronic obstructive pulmonary disease) ([place])   . Medical history non-contributory     Past Surgical History:  Procedure Laterality Date  . DIRECT LARYNGOSCOPY N/A 07/09/2019   Procedure: DIRECT LARYNGOSCOPY WITH BIOPSY;  Surgeon: Jerrell Belfast, MD;  Location: [facility] ORS;  Service: ENT;  Laterality: N/A;  . NO PAST SURGERIES    . TRACHEOSTOMY TUBE PLACEMENT N/A 07/09/2019   Procedure: TRACHEOSTOMY;  Surgeon: Jerrell Belfast, MD;  Location: [facility] ORS;  Service: ENT;  Laterality: N/A;    Allergies: Patient has no known allergies.  Medications: Prior to Admission medications   Medication Sig Start Date End Date Taking? Authorizing Provider  azithromycin (ZITHROMAX) 250 MG tablet Take 1 tablet (250 mg total) by mouth daily. Patient not taking: Reported on 07/07/2019 04/19/19   Edwin Dada, MD  cefdinir (OMNICEF) 300 MG capsule Take 1 capsule (300 mg total) by mouth 2 (two) times daily. Patient not taking: Reported on 07/07/2019 04/19/19    Edwin Dada, MD     Family History  Problem Relation Age of Onset  . CAD Father     Social History   Socioeconomic History  . Marital status: Single    Spouse name: Not on file  . Number of children: Not on file  . Years of education: Not on file  . Highest education level: Not on file  Occupational History  . Not on file  Tobacco Use  . Smoking status: Current Every Day Smoker    Packs/day: 0.50    Types: Cigarettes  . Smokeless tobacco: Never Used  Substance and Sexual Activity  . Alcohol use: Yes    Alcohol/week: 24.0 standard drinks    Types: 24 Cans of beer per week  . Drug use: Not Currently  . Sexual activity: Not on file  Other Topics Concern  . Not on file  Social History Narrative  . Not on file   Social Determinants of Health   Financial Resource Strain:   . Difficulty of Paying Living Expenses:   Food Insecurity:   . Worried About Charity fundraiser in the Last Year:   . Arboriculturist in the Last Year:   Transportation Needs:   . Film/video editor (Medical):   Marland Kitchen Lack of Transportation (Non-Medical):   Physical Activity:   . Days of Exercise per Week:   . Minutes of Exercise per Session:   Stress:   . Feeling of Stress :   Social Connections:   . Frequency of Communication with Friends and Family:   . Frequency of Social  Gatherings with Friends and Family:   . Attends Religious Services:   . Active Member of Clubs or Organizations:   . Attends Archivist Meetings:   Marland Kitchen Marital Status:       Review of Systems currently without fever headache, worsening chest pain, abdominal/back pain, nausea, vomiting or bleeding.  He does have dyspnea, occasional cough, weight loss, weakness/fatigue and dysphagia.  Vital Signs: BP 130/81   Pulse 79   Temp 98.1 F (36.7 C) (Oral)   Resp 20   Ht 5\' 11"  (1.803 m)   Wt 92 lb 9.5 oz (42 kg)   SpO2 99%   BMI 12.91 kg/m   Physical Exam patient awake, nonverbal, answers simple  questions.  Trach in place.  Chest with scattered rhonchi.  Heart with regular rate and rhythm.  Abdomen soft, positive bowel sounds, nontender.  No lower extremity edema.  Imaging: CT Soft Tissue Neck W Contrast  Result Date: 07/08/2019 CLINICAL DATA:  Neck mass EXAM: CT NECK WITH CONTRAST TECHNIQUE: Multidetector CT imaging of the neck was performed using the standard protocol following the bolus administration of intravenous contrast. CONTRAST:  57mL OMNIPAQUE IOHEXOL 300 MG/ML  SOLN COMPARISON:  None. FINDINGS: Evaluation is limited by poor soft tissue resolution in the setting significant cachexia and probable diffuse edema. Additionally, motion artifact is present. Pharynx and larynx: There is enhancing soft tissue in the post cricoid region extending superiorly with effacement of the piriform sinuses and thickening of the aryepiglottic folds. Salivary glands: Unremarkable. Thyroid: Unremarkable. Lymph nodes: No definite enlarged lymph nodes. Vascular: Major neck vessels are patent. There is calcified plaque at the right greater than left ICA origins. There may be significant stenosis on the right. Limited intracranial: No abnormal enhancement. Visualized orbits: Unremarkable. Mastoids and visualized paranasal sinuses: Bilateral maxillary sinus air-fluid levels. Visualized mastoid air cells are clear. Skeleton: Multilevel degenerative changes of the cervical spine. Upper chest: Emphysema. New extension of centrilobular/tree-in-bud opacities within the left upper lobe. Probable small left pleural effusion Other: None. IMPRESSION: Suboptimal evaluation due to motion artifact and poor soft tissue resolution in the setting of significant cachexia and probable diffuse edema. Enhancing hypopharyngeal soft tissue in the post cricoid region extending superiorly with effacement of the piriform sinuses and thickening of the aryepiglottic folds. Malignancy is suspected and direct visual inspection is recommended.  Extension of pneumonia into the left upper lobe. Probable small left pleural effusion. Maxillary sinus air-fluid levels, a nonspecific finding that can reflect acute sinusitis in the appropriate setting. Electronically Signed   By: Macy Mis M.D.   On: 07/08/2019 13:45   CT Angio Chest PE W and/or Wo Contrast  Result Date: 07/07/2019 CLINICAL DATA:  Failure to thrive, history of pneumonia, cough EXAM: CT ANGIOGRAPHY CHEST WITH CONTRAST TECHNIQUE: Multidetector CT imaging of the chest was performed using the standard protocol during bolus administration of intravenous contrast. Multiplanar CT image reconstructions and MIPs were obtained to evaluate the vascular anatomy. CONTRAST:  55mL OMNIPAQUE IOHEXOL 350 MG/ML SOLN COMPARISON:  07/07/2019 FINDINGS: Cardiovascular: This is a technically adequate evaluation of the pulmonary vasculature. No filling defects or pulmonary emboli. The heart is unremarkable without pericardial effusion. Mild calcification of the mitral and aortic valves. Mild atherosclerosis of the coronary vasculature. Mediastinum/Nodes: No enlarged mediastinal, hilar, or axillary lymph nodes. Thyroid gland, trachea, and esophagus demonstrate no significant findings. Because of marked thoracic kyphosis and positioning, a significant portion of the hypopharynx is included on this exam. There is asymmetric soft tissue in the supraglottic  region, incompletely evaluated on this study. I am suspicious of a mass in the right hypopharynx, reference image 15. Follow-up CT neck with contrast or direct visual inspection is recommended. Lungs/Pleura: There is severe background emphysema. Bilateral lower lobe consolidation is seen, left greater than right. Given the above findings in the hypopharynx, aspiration should be considered in addition to community acquired pneumonia. Within the densely consolidated left lower lobe there is evidence of an enhancing 2.7 cm mass, which could reflect metastatic disease.  This is best seen on image 126 of series 4. No effusion or pneumothorax. Upper Abdomen: Patient is markedly cachectic. No gross abnormalities are visualized. Musculoskeletal: No acute or destructive bony lesions. Reconstructed images demonstrate no additional findings. Review of the MIP images confirms the above findings. IMPRESSION: 1. No evidence of pulmonary embolus. 2. Abnormal soft tissue in the hypopharynx, suspicious for underlying mass. CT neck or direct visual inspection recommended for further evaluation. 3. Dense bilateral lower lobe consolidation, left greater than right. Aspiration is suspected given findings in the hypopharynx. 4. There is evidence of an enhancing mass within the densely consolidated left lower lobe, metastatic disease or primary neoplasm not excluded. 5. Marked cachexia. Electronically Signed   By: Randa Ngo M.D.   On: 07/07/2019 19:12   CT ABDOMEN PELVIS W CONTRAST  Result Date: 07/08/2019 CLINICAL DATA:  Lymphoma staging. EXAM: CT ABDOMEN AND PELVIS WITH CONTRAST TECHNIQUE: Multidetector CT imaging of the abdomen and pelvis was performed using the standard protocol following bolus administration of intravenous contrast. CONTRAST:  43mL OMNIPAQUE IOHEXOL 300 MG/ML  SOLN COMPARISON:  CT chest 07/07/2019 FINDINGS: Lower chest: Lung bases demonstrate interval worsening bibasilar consolidation left much worse than right. Rim enhancing low-density structure within the dense consolidation in the left base without significant change which could represent underlying malignancy versus necrotic infection or pulmonary abscess. Calcification of the mitral valve annulus. Hepatobiliary: Liver, gallbladder and biliary tree are normal. Pancreas: Normal. Spleen: Normal. Adrenals/Urinary Tract: Adrenal glands are unremarkable. Kidneys are normal in size and demonstrate bilateral streaky cortical low-attenuation which can be seen in pyelonephritis. No evidence of hydronephrosis. No definite  Peri nephric inflammation or fluid. Ureters are not well visualized. Contrast is present within the bladder from patient's recent chest CT. Stomach/Bowel: Stomach and small bowel are unremarkable. Appendix is not well visualized. Colon is unremarkable. Moderate fecal retention over the rectum. Vascular/Lymphatic: Mild calcified plaque over the abdominal aorta which is normal caliber. No definite adenopathy. Reproductive: Normal. Other: Evidence of cachexia with significant decrease mesenteric fat and subcutaneous fat present. Musculoskeletal: Degenerative change of the spine and hips. IMPRESSION: 1. Interval worsening of bibasilar airspace consolidation left base worse than right likely due to pneumonia. Stable rim enhancing low-density focus over the left base consolidation which may be due to necrosis, pulmonary abscess or underlying malignancy. 2.  No evidence of metastatic disease within the abdomen. 3. Bilateral streaky cortical low-attenuation over the kidneys which can be seen in pyelonephritis. Recommend clinical correlation. 4.  Aortic Atherosclerosis (ICD10-I70.0). 5. Findings compatible patient's clinical cachexia with significant decreased subcutaneous and mesenteric fat. Electronically Signed   By: Marin Olp M.D.   On: 07/08/2019 13:03   DG CHEST PORT 1 VIEW  Result Date: 07/13/2019 CLINICAL DATA:  Shortness of breath EXAM: PORTABLE CHEST 1 VIEW COMPARISON:  Jul 11, 2019 FINDINGS: Tracheostomy catheter tip is 5.6 cm above the carina. No pneumothorax. Airspace consolidation throughout the left mid and lower lung zones with associated left pleural effusion noted. There is a small  right pleural effusion. Right lung otherwise clear. Heart size and pulmonary vascularity are normal. No adenopathy no bone lesions. IMPRESSION: Airspace consolidation in the mid and lower lung zones on the left with left pleural effusion persists. Small right pleural effusion. No new opacity appreciable. Stable cardiac  silhouette. No pneumothorax. Electronically Signed   By: Lowella Grip III M.D.   On: 07/13/2019 08:00   DG CHEST PORT 1 VIEW  Result Date: 07/11/2019 CLINICAL DATA:  Short of breath EXAM: PORTABLE CHEST 1 VIEW COMPARISON:  CT 07/07/2019, radiograph 07/09/2019 FINDINGS: Tracheostomy tube unchanged.  Normal cardiac silhouette. There is bibasilar airspace disease. Airspace disease is dense at the LEFT lung base. Findings correspond to a dense lower lobe pneumonia seen on comparison CT. Upper lobes are clear. IMPRESSION: No change in dense LEFT lower lobe pneumonia and mild RIGHT lower lobe pneumonia. Electronically Signed   By: Suzy Bouchard M.D.   On: 07/11/2019 08:01   DG CHEST PORT 1 VIEW  Result Date: 07/09/2019 CLINICAL DATA:  Short of breath. EXAM: PORTABLE CHEST 1 VIEW COMPARISON:  07/07/2019 FINDINGS: Normal heart size. Interval complete atelectasis of the left lower lobe with hyperexpansion of the left upper lobe. Progressive airspace disease within the left upper lobe. Right lung is clear. IMPRESSION: 1. Interval complete atelectasis of the left lower lobe which is concerning for either postobstructive changes due to left hilar adenopathy versus mucous plugging. 2. Progressive airspace disease within the hyperexpanded left lung. Electronically Signed   By: Kerby Moors M.D.   On: 07/09/2019 08:26   DG Chest Port 1 View  Result Date: 07/07/2019 CLINICAL DATA:  Cough. EXAM: PORTABLE CHEST 1 VIEW COMPARISON:  04/18/2019 FINDINGS: There is a new left lower lobe airspace opacity concerning for pneumonia. The heart size is stable. Aortic calcifications are noted. The lungs are hyperexpanded. There is no pneumothorax. There is no acute osseous abnormality. IMPRESSION: 1. New left lower lobe airspace opacity concerning for pneumonia. Follow-up to radiologic resolution is recommended. 2. COPD. Electronically Signed   By: Constance Holster M.D.   On: 07/07/2019 17:18   DG Swallowing Func-Speech  Pathology  Result Date: 07/08/2019 Objective Swallowing Evaluation: Type of Study: MBS-Modified Barium Swallow Study  Patient Details Name: Devon Peterson MRN: [id number] Date of Birth: December 30, 1955 Today's Date: 07/08/2019 Time: SLP Start Time (ACUTE ONLY): 6789 -SLP Stop Time (ACUTE ONLY): 1320 SLP Time Calculation (min) (ACUTE ONLY): 25 min Past Medical History: Past Medical History: Diagnosis Date . Medical history non-contributory  Past Surgical History: Past Surgical History: Procedure Laterality Date . NO PAST SURGERIES   HPI: 64 yo with progressive dysphagia, appears cachetic, found to have likely aspiration pna per CT - enhancing mass LLL- suspected aspiration.  . Imaging studies also concerning for soft tissue mass in hypopharynx. Pt has been a consumer of ETOH - beer and smokes cigarettes.   CT neck today showed "Enhancing hypopharyngeal soft tissue in the post cricoid region extending superiorly with effacement of the piriform sinuses andthickening of the aryepiglottic folds."  Pt desired to eat/drink and swallow evaluation had been ordered. SLP advised an MBS to view impact of mass on pharyngeal swallow.  Subjective: pt awake in chair Assessment / Plan / Recommendation CHL IP CLINICAL IMPRESSIONS 07/08/2019 Clinical Impression Pt presents with gross obstructive based dysphagia secondary to his "Enhancing hypopharyngeal soft tissue in the post cricoid region extending superiorly with effacement of the piriform sinuses and thickening of the aryepiglottic folds"per CT imaging.  Gross pharyngeal-cervical esophageal retention mixed with secretions  was aspirated post=swallow as residuals spilled into an open airway. Pt consumption of small cup bolus resulted in improved muscular contraction and thus clearance into esophagus.  Recommend pt be npo except single ice chips after oral care.  Pt was informed to clinical reasoning for recommendations but immediately asked if he could eat - clearly demonstrating compromised  understanding. SLP Visit Diagnosis Dysphagia, oropharyngeal phase (R13.12);Dysphagia, pharyngoesophageal phase (R13.14) Attention and concentration deficit following -- Frontal lobe and executive function deficit following -- Impact on safety and function Severe aspiration risk;Risk for inadequate nutrition/hydration   CHL IP TREATMENT RECOMMENDATION 07/08/2019 Treatment Recommendations Therapy as outlined in treatment plan below   Prognosis 07/08/2019 Prognosis for Safe Diet Advancement Guarded Barriers to Reach Goals -- Barriers/Prognosis Comment severity of dysphagia due to pt's mass CHL IP DIET RECOMMENDATION 07/08/2019 SLP Diet Recommendations NPO;Ice chips PRN after oral care Liquid Administration via -- Medication Administration Via alternative means Compensations (No Data) Postural Changes --   CHL IP OTHER RECOMMENDATIONS 07/08/2019 Recommended Consults -- Oral Care Recommendations Oral care QID Other Recommendations --   CHL IP FOLLOW UP RECOMMENDATIONS 07/08/2019 Follow up Recommendations (No Data)   CHL IP FREQUENCY AND DURATION 07/08/2019 Speech Therapy Frequency (ACUTE ONLY) min 2x/week Treatment Duration 2 weeks      CHL IP ORAL PHASE 07/08/2019 Oral Phase Impaired Oral - Pudding Teaspoon -- Oral - Pudding Cup -- Oral - Honey Teaspoon -- Oral - Honey Cup -- Oral - Nectar Teaspoon Reduced posterior propulsion;Weak lingual manipulation;Delayed oral transit;Other (Comment) Oral - Nectar Cup Reduced posterior propulsion;Weak lingual manipulation;Delayed oral transit;Other (Comment);Lingual/palatal residue Oral - Nectar Straw -- Oral - Thin Teaspoon Weak lingual manipulation;Delayed oral transit;Other (Comment);Premature spillage Oral - Thin Cup Delayed oral transit;Weak lingual manipulation;Reduced posterior propulsion;Other (Comment);Lingual/palatal residue Oral - Thin Straw -- Oral - Puree -- Oral - Mech Soft -- Oral - Regular -- Oral - Multi-Consistency -- Oral - Pill -- Oral Phase - Comment lingual rocking observed  across all boluses  CHL IP PHARYNGEAL PHASE 07/08/2019 Pharyngeal Phase Impaired Pharyngeal- Pudding Teaspoon -- Pharyngeal -- Pharyngeal- Pudding Cup -- Pharyngeal -- Pharyngeal- Honey Teaspoon -- Pharyngeal -- Pharyngeal- Honey Cup -- Pharyngeal -- Pharyngeal- Nectar Teaspoon Pharyngeal residue - pyriform;Pharyngeal residue - posterior pharnyx;Pharyngeal residue - cp segment;Penetration/Apiration after swallow;Moderate aspiration;Lateral channel residue;Inter-arytenoid space residue;Reduced airway/laryngeal closure;Reduced epiglottic inversion;Reduced anterior laryngeal mobility;Reduced laryngeal elevation Pharyngeal Material enters airway, passes BELOW cords and not ejected out despite cough attempt by patient;Material enters airway, passes BELOW cords without attempt by patient to eject out (silent aspiration);Material enters airway, passes BELOW cords then ejected out Pharyngeal- Nectar Cup Penetration/Apiration after swallow;Pharyngeal residue - pyriform;Pharyngeal residue - posterior pharnyx;Pharyngeal residue - cp segment;Inter-arytenoid space residue;Lateral channel residue;Reduced airway/laryngeal closure;Reduced epiglottic inversion;Reduced laryngeal elevation;Reduced anterior laryngeal mobility Pharyngeal Material enters airway, passes BELOW cords without attempt by patient to eject out (silent aspiration) Pharyngeal- Nectar Straw -- Pharyngeal -- Pharyngeal- Thin Teaspoon Penetration/Apiration after swallow;Pharyngeal residue - pyriform;Pharyngeal residue - posterior pharnyx;Pharyngeal residue - cp segment;Inter-arytenoid space residue;Lateral channel residue;Reduced tongue base retraction;Reduced epiglottic inversion;Penetration/Aspiration before swallow;Penetration/Aspiration during swallow;Moderate aspiration;Reduced airway/laryngeal closure;Reduced laryngeal elevation;Reduced anterior laryngeal mobility Pharyngeal Material enters airway, passes BELOW cords without attempt by patient to eject out  (silent aspiration);Material enters airway, passes BELOW cords and not ejected out despite cough attempt by patient Pharyngeal- Thin Cup Pharyngeal residue - pyriform;Pharyngeal residue - posterior pharnyx;Pharyngeal residue - valleculae;Pharyngeal residue - cp segment;Inter-arytenoid space residue;Lateral channel residue;Reduced airway/laryngeal closure;Reduced epiglottic inversion;Reduced laryngeal elevation;Reduced anterior laryngeal mobility Pharyngeal Material enters airway, passes BELOW cords  without attempt by patient to eject out (silent aspiration);Material enters airway, passes BELOW cords and not ejected out despite cough attempt by patient Pharyngeal- Thin Straw -- Pharyngeal -- Pharyngeal- Puree -- Pharyngeal -- Pharyngeal- Mechanical Soft -- Pharyngeal -- Pharyngeal- Regular -- Pharyngeal -- Pharyngeal- Multi-consistency -- Pharyngeal -- Pharyngeal- Pill -- Pharyngeal -- Pharyngeal Comment --  CHL IP CERVICAL ESOPHAGEAL PHASE 07/08/2019 Cervical Esophageal Phase Impaired Pudding Teaspoon -- Pudding Cup -- Honey Teaspoon -- Honey Cup -- Nectar Teaspoon -- Nectar Cup -- Nectar Straw -- Thin Teaspoon -- Thin Cup -- Thin Straw -- Puree -- Mechanical Soft -- Regular -- Multi-consistency -- Pill -- Cervical Esophageal Comment Pt with minimal clearance of barium into esophagus due to his obstructive mass which results in gross retention of barium mixed with secretions with aspiration post=swallow. Kathleen Lime, MS [facility] SLP Acute Rehab Services Office [phone] [name] 07/08/2019, 2:47 PM               Labs:  CBC: Recent Labs    07/10/19 0237 07/11/19 0238 07/12/19 0330 07/13/19 0412  WBC 10.0 5.9 5.9 5.7  HGB 11.5* 11.3* 10.0* 10.0*  HCT 34.7* 34.3* 29.5* 29.5*  PLT 249 212 208 212    COAGS: Recent Labs    07/07/19 1650 07/13/19 0455  INR 1.0 1.1  APTT 27  --     BMP: Recent Labs    07/11/19 0238 07/11/19 0238 07/11/19 0841 07/11/19 1958 07/12/19 0330 07/13/19 0412  NA  139  --   --  133* 130* 132*  K 3.3*   < > 3.2* 4.2 3.1* 3.5  CL 102  --   --  93* 95* 95*  CO2 27  --   --  27 26 29   GLUCOSE 85  --   --  234* 98 84  BUN 7*  --   --  5* <5* <5*  CALCIUM 8.4*  --   --  8.5* 8.1* 8.1*  CREATININE 0.40*  --   --  0.51* 0.40* 0.45*  GFRNONAA >60  --   --  >60 >60 >60  GFRAA >60  --   --  >60 >60 >60   < > = values in this interval not displayed.    LIVER FUNCTION TESTS: Recent Labs    07/10/19 0237 07/11/19 0238 07/12/19 0330 07/13/19 0412  BILITOT 0.6 0.7 0.7 0.6  AST 19 17 16  14*  ALT 10 9 8 9   ALKPHOS 55 51 50 52  PROT 5.8* 5.5* 5.3* 5.5*  ALBUMIN 2.4* 2.4* 2.2* 2.2*    TUMOR MARKERS: No results for input(s): AFPTM, CEA, CA199, CHROMGRNA in the last 8760 hours.  Assessment and Plan: 64 y.o. male with past medical history of polysubstance abuse, COPD who was admitted to [facility] on 5/4 with cough, dyspnea, weight loss, fatigue, recurrent aspiration pneumonia, dysphagia and protein calorie malnutrition.  Patient underwent trach placement on 5/7.  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  He has also had prior A. fib and hypotension along with hypoglycemia.  He is COVID-19 negative.  Request now received from primary care team for percutaneous gastrostomy tube placement.  Imaging studies have been reviewed by Dr. Anselm Pancoast.Risks and benefits image guided gastrostomy tube placement was discussed with the patient/sister including, but not limited to the need for a barium enema during the procedure, bleeding, infection, peritonitis and/or damage to adjacent structures.  All of  the patient's questions were answered, patient is agreeable to proceed.  Consent signed and in chart.  Procedure tentatively planned for this afternoon.  Patient is afebrile, PT/INR normal, creatinine 0.45, WBC 5.7, hemoglobin 10, platelets 212k   Thank you for this interesting consult.  I greatly  enjoyed meeting Devon Peterson and look forward to participating in their care.  A copy of this report was sent to the requesting provider on this date.  Electronically Signed: D. Rowe Robert, PA-C 07/13/2019, 9:33 AM   I spent a total of  30 minutes   in face to face in clinical consultation, greater than 50% of which was counseling/coordinating care for percutaneous gastrostomy tube placement

## 2019-07-13 NOTE — Progress Notes (Signed)
Trach check not done due to pt not on the floor.

## 2019-07-14 ENCOUNTER — Inpatient Hospital Stay (HOSPITAL_COMMUNITY): Payer: Medicaid Other

## 2019-07-14 HISTORY — PX: IR GASTROSTOMY TUBE MOD SED: IMG625

## 2019-07-14 LAB — COMPREHENSIVE METABOLIC PANEL
ALT: 9 U/L (ref 0–44)
AST: 12 U/L — ABNORMAL LOW (ref 15–41)
Albumin: 2 g/dL — ABNORMAL LOW (ref 3.5–5.0)
Alkaline Phosphatase: 51 U/L (ref 38–126)
Anion gap: 9 (ref 5–15)
BUN: <5 mg/dL — ABNORMAL LOW (ref 8–23)
CO2: 26 mmol/L (ref 22–32)
Calcium: 7.9 mg/dL — ABNORMAL LOW (ref 8.9–10.3)
Chloride: 98 mmol/L (ref 98–111)
Creatinine, Ser: 0.46 mg/dL — ABNORMAL LOW (ref 0.61–1.24)
GFR calc Af Amer: >60 mL/min (ref >60)
GFR calc non Af Amer: >60 mL/min (ref >60)
Glucose, Bld: 106 mg/dL — ABNORMAL HIGH (ref 70–99)
Potassium: 3.4 mmol/L — ABNORMAL LOW (ref 3.5–5.1)
Sodium: 133 mmol/L — ABNORMAL LOW (ref 135–145)
Total Bilirubin: 0.4 mg/dL (ref 0.3–1.2)
Total Protein: 5.2 g/dL — ABNORMAL LOW (ref 6.5–8.1)

## 2019-07-14 LAB — MAGNESIUM: Magnesium: 1.9 mg/dL (ref 1.7–2.4)

## 2019-07-14 LAB — GLUCOSE, CAPILLARY
Glucose-Capillary: 73 mg/dL (ref 70–99)
Glucose-Capillary: 90 mg/dL (ref 70–99)
Glucose-Capillary: 116 mg/dL — ABNORMAL HIGH (ref 70–99)
Glucose-Capillary: 86 mg/dL (ref 70–99)
Glucose-Capillary: 129 mg/dL — ABNORMAL HIGH (ref 70–99)

## 2019-07-14 LAB — CBC WITH DIFFERENTIAL/PLATELET
Abs Immature Granulocytes: 0.01 10*3/uL (ref 0.00–0.07)
Basophils Absolute: 0 10*3/uL (ref 0.0–0.1)
Basophils Relative: 0 %
Eosinophils Absolute: 0 10*3/uL (ref 0.0–0.5)
Eosinophils Relative: 1 %
HCT: 27 % — ABNORMAL LOW (ref 39.0–52.0)
Hemoglobin: 9.2 g/dL — ABNORMAL LOW (ref 13.0–17.0)
Immature Granulocytes: 0 %
Lymphocytes Relative: 31 %
Lymphs Abs: 1.4 10*3/uL (ref 0.7–4.0)
MCH: 28.4 pg (ref 26.0–34.0)
MCHC: 34.1 g/dL (ref 30.0–36.0)
MCV: 83.3 fL (ref 80.0–100.0)
Monocytes Absolute: 0.3 10*3/uL (ref 0.1–1.0)
Monocytes Relative: 7 %
Neutro Abs: 2.6 10*3/uL (ref 1.7–7.7)
Neutrophils Relative %: 61 %
Platelets: 227 10*3/uL (ref 150–400)
RBC: 3.24 MIL/uL — ABNORMAL LOW (ref 4.22–5.81)
RDW: 12.6 % (ref 11.5–15.5)
WBC: 4.3 10*3/uL (ref 4.0–10.5)
nRBC: 0 % (ref 0.0–0.2)

## 2019-07-14 LAB — PHOSPHORUS: Phosphorus: 3 mg/dL (ref 2.5–4.6)

## 2019-07-14 MED ORDER — LIDOCAINE-EPINEPHRINE 2 %-1:100000 IJ SOLN
INTRAMUSCULAR | Status: AC | PRN
  Administered 2019-07-14: 10 mL via INTRADERMAL

## 2019-07-14 MED ORDER — FENTANYL CITRATE (PF) 100 MCG/2ML IJ SOLN
INTRAMUSCULAR | Status: AC
  Filled 2019-07-14: qty 2

## 2019-07-14 MED ORDER — GLUCAGON HCL RDNA (DIAGNOSTIC) 1 MG IJ SOLR
INTRAMUSCULAR | Status: AC
  Filled 2019-07-14: qty 1

## 2019-07-14 MED ORDER — MIDAZOLAM HCL 2 MG/2ML IJ SOLN
INTRAMUSCULAR | Status: AC
  Filled 2019-07-14: qty 2

## 2019-07-14 MED ORDER — MIDAZOLAM HCL 2 MG/2ML IJ SOLN
INTRAMUSCULAR | Status: AC | PRN
  Administered 2019-07-14: 1 mg via INTRAVENOUS

## 2019-07-14 MED ORDER — GLUCAGON HCL (RDNA) 1 MG IJ SOLR
INTRAMUSCULAR | Status: AC | PRN
  Administered 2019-07-14: .5 mg via INTRAVENOUS

## 2019-07-14 MED ORDER — FENTANYL CITRATE (PF) 100 MCG/2ML IJ SOLN
INTRAMUSCULAR | Status: AC | PRN
  Administered 2019-07-14: 25 ug via INTRAVENOUS

## 2019-07-14 MED ORDER — LIDOCAINE HCL 1 % IJ SOLN
INTRAMUSCULAR | Status: AC
  Filled 2019-07-14: qty 20

## 2019-07-14 NOTE — TOC Progression Note (Signed)
Transition of Care [facility]) - Progression Note    Patient Details  Name: Devon Peterson MRN: [id number] Date of Birth: 12/21/1955  Transition of Care [facility]) CM/SW Contact  Leeroy Cha, RN Phone Number: 07/14/2019, 9:03 AM  Clinical Narrative:    Plan for patient to go to snf post hospitalization. Passar number=[phone] A ATF=[phone] Faxed out to area snf's Peg tube inserted on 051021, trach done on 050621 on 28% o2 via collar, Iv unasyn, D10 due to   Expected Discharge Plan: [place] Barriers to Discharge: Continued Medical Work up  Expected Discharge Plan and Services Expected Discharge Plan: [place] Acute Care Choice: [place]                                         Social Determinants of Health (SDOH) Interventions    Readmission Risk Interventions No flowsheet data found.

## 2019-07-14 NOTE — Progress Notes (Signed)
SLP Cancellation Note  Patient Details Name: Devon Peterson MRN: [id number] DOB: 04-13-1955   Cancelled treatment:       Reason Eval/Treat Not Completed: Other (comment)(per notes from MD and ICU assessment, pt continues with copious secretions - will continue efforts for PMSV)  Kathleen Lime, MS [place] Office [phone]  [name] 07/14/2019, 2:00 PM

## 2019-07-14 NOTE — Progress Notes (Signed)
PROGRESS NOTE    Devon Peterson  [id number] DOB: 08/29/1955 DOA: 07/07/2019 PCP: Patient, No Pcp Per  Brief Narrative:  HPI per Dr. Shela Leff on 07/07/19 Devon Peterson is a 64 y.o. male with medical history significant of COPD, polysubstance abuse (alcohol, tobacco, cocaine), hospital admission 04/17/2019-04/19/2019 for sepsis secondary to pneumonia and COPD exacerbation presenting to the ED via EMS for evaluation of cough, shortness of breath, fatigue, and unintentional weight loss.  History provided by patient and his nephew at bedside.  Nephew states patient was admitted to the hospital a month ago for pneumonia and since then has continued to do poorly.  He has difficulty swallowing food and has been spitting up saliva.  He is coughing a lot.  Patient denies fevers, chills, shortness of breath, chest pain, nausea, vomiting, abdominal pain, diarrhea, or dysuria.  Nephew states patient has been smoking a pack of cigarettes daily since a very young age.  He has lost a lot of weight recently. Addendum: 07/07/2019 8:40 PM.  Patient also denied history of hematemesis, hematochezia, or melena.  ED Course: Tachycardic on arrival with heart rate in the 150s, appeared to be sinus rhythm.  Afebrile.  Slightly tachypneic.  Labs showing no leukocytosis.  Initial lactic acid 2.5, repeat after IV fluid pending.  Hemoglobin 12.7, stable compared to labs done in February 2021.  Platelet count normal.  BUN 27, creatinine 0.8.  Albumin 3.3.  LFTs normal.  Blood culture x2 pending.  SARS-CoV-2 PCR test negative.  Influenza panel negative.  UA not suggestive of infection.  Urine culture pending. Chest x-ray showing new left lower lobe airspace opacity concerning for pneumonia. CT angiogram chest negative for PE.  Showing abnormal soft tissue in the hypopharynx suspicious for underlying mass.  Dense bilateral lower lobe consolidation, left greater than right.  Aspiration suspected given findings in the hypopharynx.   Also showing evidence of an enhancing mass within the densely consolidated left lower lobe, metastatic disease or primary neoplasm not excluded.  Marked cachexia.  Patient received ceftriaxone, azithromycin, and 2 L normal saline boluses.  **Interim History He was evaluated by ENT who recommended a tracheostomy.  Tracheostomy was done on 07/10/19 and postoperatively he ended up on pressors which were weaned off.  He is also getting a tissue biopsy and medical oncology as well as radiation oncology were consulted for further evaluation.  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 Postoperatively he had hypotension and went in A. fib with RVR and started on Neo-Synephrine drip has been stopped.  Critical care was consulted for further evaluation and his hypotension is improved.  We will continue IV fluid but patient needs long-term nutrition needs and needs a PEG tube and have called IR but they will reevaluate Monday for a PEG tube given the national shortage of PEG tube.  On 07/11/19 Patient's tracheostomy was doing okay but he was having a lot of secretions but not as much.  Again radiation oncology wants to have improvement in the patient's performance status as well as nutritional status as well as further evaluation of the lung mass with likely pet imaging as an outpatient and potential biopsy of lung lesion to determine if this is metastatic versus a second primary.  This will help determine radiation oncology if treatment will be of curative or more of a palliative approach.  They will continue to collaborate with medical oncology  On 07/12/19 he continued to have significant amount of secretions however they are becoming thicker we will try Mucomyst  as well as Robinul to thin the secretions.  He denies any current pain at this time but was very weak and is severely deconditioned so PT OT was reconsulted again.  Yesterday he went for a percutaneous gastrostomy tube placement procedure had  to be canceled due to the power going out in the high room setting down.  The fluoroscopy machine was unable to regain power and the procedure was aborted.Marland Kitchen  His blood sugars remain relatively low still (50's-70's) being on the D10 so the D10 has been increased to 75 mL's and has normal sinus for reduced to 30 mL.  Once he has a PEG in place we will start the tube feedings.  Tracheostomy is in place and his secretions are improving with Mucomyst and pulmonary recommending to deflate the cuff and attempt PMV trials now that his bleeding has improved.  They are recommending continuing ATC as tolerated  Today he will be going for his PEG tube placement and nutrition is recommending that once the PEG is placed starting the patient on Osmolite 1.520 mL/h to advance by 10 mL every 12 hours to reach a goal rate of 50 mL/h with Protostat 30 mL once daily.   Assessment & Plan:   Principal Problem:   Aspiration pneumonia ([place]) Active Problems:   Sepsis ([place])   Dysphagia   Severe protein-calorie malnutrition ([place])   Alcohol use   Pressure injury of skin   Lung mass   Neck mass   Tracheostomy care ([place])  Sepsis 2/2 to Aspiration Pneumonia -Was Tachycardic and tachypneic on arrival.  No leukocytosis on labs.  -Does have mild lactic acidosis.   -CT showing dense bilateral lower lobe consolidation, left greater than right suspicious for aspiration pneumonia given given findings suspicious for an abnormal soft tissue mass in the hypopharynx.  -SARS-CoV-2 PCR test negative.  Influenza panel negative.   -Sepsis physiology has improved but he is now hypotensive and see below -Currently satting in the low to mid 90s on room air. -Heart rate has improved after IV fluid boluses but post operatively he went into A Fib with RVR and is now improved  -Continue IV fluid hydration and antibiotic coverage with Unasyn will continue for total 7 days. Today is Day 7/7 and will Stop Abx  -Trend lactate.   -Blood culture  x2 showed no growth today at 5 days -CT Neck showed "Extension of pneumonia into the left upper lobe. Probable small left pleural effusion. Maxillary sinus air-fluid levels, a nonspecific finding that can reflect acute sinusitis in the appropriate setting." -CT Abd and Pelvis showed "Interval worsening of bibasilar airspace consolidation left base worse than right likely due to pneumonia. Stable rim enhancing low-density focus over the left base consolidation which may be due to necrosis, pulmonary abscess or underlying malignancy.   No evidence of metastatic disease within the abdomen. Bilateral streaky cortical low-attenuation over the kidneys which can be seen in pyelonephritis. Recommend clinical correlation.  Aortic Atherosclerosis (ICD10-I70.0). Findings compatible patient's clinical cachexia with significant decreased subcutaneous and mesenteric fat." -Continuous pulse ox, supplemental oxygen if needed. -Patient now has a tracheostomy and will be admitted to the ICU for postoperative care -Obtain SLP Evaluation and MBS; after MBS was done SLP recommending n.p.o. and ice chips as needed after oral care; Will need a PEG and see Below  -CXR today showed "Unchanged tracheostomy tube. The heart size and mediastinal contours are within normal limits. Normal pulmonary vascularity. Slightly improved consolidation in the left lower lobe. Unchanged mild  airspace disease at the right lung base. Unchanged small left pleural effusion. The lungs remain hyperinflated with emphysematous changes. No pneumothorax. 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  -Also showing evidence of an enhancing mass within the densely consolidated left lower lobe, metastatic disease or primary neoplasm not excluded.   -Patient is severely cachectic.  Endorsing dysphagia and unintentional weight loss.   -Longstanding history of smoking cigarettes. -Ordered CT soft tissue neck with contrast and showed "Suboptimal evaluation due to motion artifact and poor soft tissue resolution in the setting of significant cachexia and probable diffuse edema. Enhancing hypopharyngeal soft tissue in the post cricoid region extending superiorly with effacement of the piriform sinuses and thickening of the aryepiglottic folds. Malignancy is suspected and direct visual inspection is recommended. Extension of pneumonia into the left upper lobe. Probable small left pleural effusion. Maxillary sinus air-fluid levels, a nonspecific finding that can reflect acute sinusitis in the appropriate setting." -CT of the abdomen pelvis showed "Interval worsening of bibasilar airspace consolidation left base worse than right likely due to pneumonia. Stable rim enhancing low-density focus over the left base consolidation which may be due to necrosis, pulmonary abscess or underlying malignancy.   No evidence of metastatic disease within the abdomen. Bilateral streaky cortical low-attenuation over the kidneys which can be seen in pyelonephritis. Recommend clinical correlation.  Aortic Atherosclerosis (ICD10-I70.0). 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  They will arrange follow-up for the left lung lesion and arrange for biopsy of the masslike component persists and they are going to discuss the diagnosis of laryngeal cancer treatment options and they will reevaluate 510 -Radiation oncology also followed up today and they want his nutritional status a little bit better before they discussed radiation treatments as I mentioned above -PEG tube to be done today  -Pulmonary feels that the patient will likely be trach dependent and they recommend continuing trach collar: Initially recommend keeping the cuff inflated related to his bleeding but now they are recommending that it is okay to deflate the cuff and attempt PMV trials that is bleeding appears to have resolved -Pulmonary also recommends continuing with Mucomyst and continuing ATC as tolerated and they feel that he will remain trach dependent as mentioned -SLP consulted to do PMSV trials and will be done after his cuff is deflated -Dr. Wilburn Cornelia of ENT is on board and appreciate assistance -Having a lot of Secretions so will add Acetylcysteine 2  mL TID and will add Glycopyrrolate 0.1 mg IV BID -Continue to Frequently Suction  Dysphagia -Keep n.p.o. at this time, SLP done and MBS and recommending NPO -He is now having a tracheostomy and likely will need a PEG tube placement -IR consulted and he is a good candidate for a PEG tube but likely will be done today -PEG to be done today and once the PEG is placed starting the patient on Osmolite 1.520 mL/h to advance by 10 mL every 12 hours to reach a goal rate of 50 mL/h with  Protostat 30 mL once daily.   A. fib with RVR, now improved -Went into A. fib with RVR in the PACU and was given IV metoprolol -Heart rates have improved some but he is hypotensive and had to be placed on a Neo-Synephrine drip -We will not anticoagulate at this time but may need anticoagulation soon -Continue monitor closely and he will be admitted to the ICU for postoperative care -He had some bleeding this morning and will avoid anticoagulation at this time  Hypotension, improving  -Postoperative hypotension and likely in the setting of his anesthesia and hypovolemia -He is getting bolused and was placed on a Neo-Synephrine drip and now off of it -Patient was off of pressors by time he arrived to the ICU and will be transferred back to the [facility] service  Severe protein calorie malnutrition/Underweight -Patient has poor oral intake, unintentional weight loss, and severe cachexia.   -Nutrition consult ordered and recommending advancing diet as medically feasible and if appropriate order diet they are recommending Ensure alive p.o. twice daily, Magic cup twice daily with meals, Juven twice daily, and recommending continue to monitor magnesium, potassium and phosphorus for least 3 days as patient is at risk for refeeding syndrome given his alcohol abuse -Will follow up with Nutritionist Reccs; once the PEG is placed starting the patient on Osmolite 1.520 mL/h to advance by 10 mL every 12 hours to reach a goal rate of 50 mL/h with Protostat 30 mL once daily.   Generalized weakness/physical deconditioning:  -PT and OT initial evaluation recommending Home Health PT but re-evaluation recommending SNF now given his condition  Alcohol use disorder -Tachycardic on arrival, heart rate improved after fluid boluses.  No other signs of withdrawal at this time. -Order CIWA protocol; Ativan as needed.  Thiamine, folate, and multivitamin.  Tobacco Use -Patient has been counseled on smoking cessation.    -NicoDerm patch ordered.  History of Cocaine use -Check UDS and was POSITIVE -Will avoid further Beta Blockers   COPD -Stable.   -No wheezing at this time.   -DuoNebs as needed via the trach collar; will not be able to go home on oral inhalers and needs nebs via trach collar at discharge -Treatment as above; See above   Hypoglycemia -In the setting of poor p.o. intake and was persistent -Has been low as he is not taking in more po Intake -He was given D50 and then started on D5 drip and will continue however continue to be a little low so we will stop the D5 and start the patient on D10 drip and will be titrated up to a 7 5 mils per hour given now that he continues to have low blood sugars -Patient is normal sinus been reduced down to 30 mL/h -Continue monitor as his BS have been ranging from 73-103  Hypokalemia  -Patient's potassium this morning was 3.4 -Pleat with IV potassium chloride 40 mEq -Continue to monitor and replete as  necessary -Repeat CMP in a.m.  Hypomagnesemia -Patient's magnesium level now 1.9 -Replete with IV Mag Sulfate 2 g yesterday -Continue to monitor and replete as necessary  -Repeat magnesium level in a.m.  Hypophosphatemia -Patient's Phos Level was 3.0 -Continue to Monitor and Replete as Necessary -Repeat Phos Level in the AM   Hyponatremia -Patient's Na+ has dropped to 130 in the setting of D10 gtt and is now improved slightly to 133 after normal saline was added -Decreased the normal saline to 30 mL's per hour and increase the D10 gtt -Continue to Monitor and Trend -Repeat CMP in AM  Normocytic Anemia -Patient's hemoglobin/hematocit is dropping slowly and went from 11.3/34.3 -> 10.0/29.5 -> 9.2/27.0 -Check anemia panel and showed an iron level of 30, U IBC 130, TIBC 150, saturation ratios of 19%, ferritin level 421, folate level 11.5, and vitamin B12 of 281 -Continue to monitor for signs and symptoms of bleeding; currently had a lot of  bleeding from his trach site to be injected with lidocaine -Repeat CBC in a.m.  Stage II sacral pressure ulcer -Present on admission -Continue with Mepilex pads and frequent turning  DVT prophylaxis: Enoxaparin 40 mg sq q24h  Code Status: FULL CODE  Family Communication: No family present at bedside  Disposition Plan: Patient is from home now here with aspiration pneumonia and likely malignancy.  He is unsafe to swallow.  Will need ENT evaluation and likely biopsy and further oncology evaluation.  He now requires a PEG tube placement and was placed in the ICU after postoperative hypotension and continues to have well secretions.  PEG tube is to be placed today as it was unfortunately not done yesterday   Status is: Inpatient  Remains inpatient appropriate because:Persistent severe electrolyte disturbances, IV treatments appropriate due to intensity of illness or inability to take PO and Inpatient level of care appropriate due to severity of illness   Dispo: The patient is from: Home              Anticipated d/c is to: SNF              Anticipated d/c date is: 2 days              Patient currently is not medically stable to d/c.  Consultants:   ENT  PCCM  Medical oncology  Patient oncology  Interventional radiology   Procedures:  CT Neck and CT Abd/Pelvis  Procedure done by Dr. Wilburn Cornelia  1.  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  Tracheostomy  Antimicrobials:  Anti-infectives (From admission, onward)   Start     Dose/Rate Route Frequency Ordered Stop   07/07/19 2200  ampicillin-sulbactam (UNASYN) 1.5 g in sodium chloride 0.9 % 100 mL IVPB     1.5 g 200 mL/hr over 30 Minutes Intravenous Every 6 hours 07/07/19 2051 07/14/19 2159   07/07/19 1930  metroNIDAZOLE (FLAGYL) IVPB 500 mg  Status:  Discontinued     500 mg 100 mL/hr over 60 Minutes Intravenous  Once 07/07/19 1922 07/07/19 2003   07/07/19 1800  cefTRIAXone  (ROCEPHIN) 1 g in sodium chloride 0.9 % 100 mL IVPB     1 g 200 mL/hr over 30 Minutes Intravenous  Once 07/07/19 1750 07/07/19 1830   07/07/19 1800  azithromycin (ZITHROMAX) 500  mg in sodium chloride 0.9 % 250 mL IVPB     500 mg 250 mL/hr over 60 Minutes Intravenous  Once 07/07/19 1750 07/07/19 1855     Subjective: Seen and examined at bedside he was resting.  He is not coughing as much today.  Reports a PEG was not able to be placed yesterday due to power outage.  Will be done today.  Patient denies any pain.  No other concerns are placed at this time.  Objective: Vitals:   07/14/19 1000 07/14/19 1100 07/14/19 1112 07/14/19 1300  BP: 99/71 107/82    Pulse:      Resp: (!) 22 19    Temp:    (!) 97.4 F (36.3 C)  TempSrc:    Oral  SpO2:   100%   Weight:      Height:        Intake/Output Summary (Last 24 hours) at 07/14/2019 1409 Last data filed at 07/14/2019 6073 Gross per 24 hour  Intake 2694.45 ml  Output 3100 ml  Net -405.55 ml   Filed Weights   07/07/19 2049 07/09/19 0949  Weight: 42 kg 42 kg   Examination: Physical Exam:  Constitutional: Patient is extremely thin and cachectic chronically ill-appearing African-American male who is slightly disheveled but currently no acute distress.  Secretions seem to have reduced and he is resting awaiting his PEG tube. Eyes: Lids and conjunctivae normal, sclerae anicteric  ENMT: External Ears, Nose appear normal. Grossly normal hearing.  Neck: Appears normal, supple, no cervical masses, normal ROM, no appreciable thyromegaly; no JVD but has a tracheostomy in place and is connected to ATC.  Not having as much secretions today Respiratory: Diminished to auscultation bilaterally with coarse breath sounds more so on the left compared to the right and does have some slight crackles.  Unlabored breathing connected to ATC. Cardiovascular: RRR, no murmurs / rubs / gallops. S1 and S2 auscultated. No extremity edema.  Abdomen: Soft, non-tender,  non-distended. Bowel sounds positive.  GU: Deferred. Musculoskeletal: No clubbing / cyanosis of digits/nails. No joint deformity upper and lower extremities.  Skin: No rashes, lesions, ulcers on limited skin evaluation I did not turn him to view his sacral decubitus. No induration; Warm and dry.  Neurologic: CN 2-12 grossly intact with no focal deficits but he is unable to speak secondary to his tracheostomy status.  Romberg sign and cerebellar reflexes not assessed.  Psychiatric: Normal judgment and insight. Alert and oriented x 3. Normal mood and appropriate affect.   Data Reviewed: I have personally reviewed following labs and imaging studies  CBC: Recent Labs  Lab 07/10/19 0237 07/11/19 0238 07/12/19 0330 07/13/19 0412 07/14/19 0240  WBC 10.0 5.9 5.9 5.7 4.3  NEUTROABS 7.6 3.5 4.1 4.1 2.6  HGB 11.5* 11.3* 10.0* 10.0* 9.2*  HCT 34.7* 34.3* 29.5* 29.5* 27.0*  MCV 85.0 86.4 81.9 83.3 83.3  PLT 249 212 208 212 710   Basic Metabolic Panel: Recent Labs  Lab 07/10/19 0237 07/10/19 0237 07/10/19 1958 07/11/19 0238 07/11/19 0238 07/11/19 0841 07/11/19 1958 07/12/19 0330 07/13/19 0412 07/14/19 0240  NA 143   < >  --  139  --   --  133* 130* 132* 133*  K 3.3*   < >  --  3.3*   < > 3.2* 4.2 3.1* 3.5 3.4*  CL 104   < >  --  102  --   --  93* 95* 95* 98  CO2 27   < >  --  27  --   --  27 26 29 26   GLUCOSE 115*   < >  --  85  --   --  234* 98 84 106*  BUN 10   < >  --  7*  --   --  5* <5* <5* <5*  CREATININE 0.51*   < >  --  0.40*  --   --  0.51* 0.40* 0.45* 0.46*  CALCIUM 8.6*   < >  --  8.4*  --   --  8.5* 8.1* 8.1* 7.9*  MG 1.3*   < > 2.3 2.1  --   --   --  1.2* 1.7 1.9  PHOS 2.9  --   --  2.4*  --   --   --  2.4* 3.5 3.0   < > = values in this interval not displayed.   GFR: Estimated Creatinine Clearance: 55.4 mL/min (A) (by C-G formula based on SCr of 0.46 mg/dL (L)). Liver Function Tests: Recent Labs  Lab 07/10/19 0237 07/11/19 0238 07/12/19 0330 07/13/19 0412  07/14/19 0240  AST 19 17 16  14* 12*  ALT 10 9 8 9 9   ALKPHOS 55 51 50 52 51  BILITOT 0.6 0.7 0.7 0.6 0.4  PROT 5.8* 5.5* 5.3* 5.5* 5.2*  ALBUMIN 2.4* 2.4* 2.2* 2.2* 2.0*   No results for input(s): LIPASE, AMYLASE in the last 168 hours. No results for input(s): AMMONIA in the last 168 hours. Coagulation Profile: Recent Labs  Lab 07/07/19 1650 07/13/19 0455  INR 1.0 1.1   Cardiac Enzymes: No results for input(s): CKTOTAL, CKMB, CKMBINDEX, TROPONINI in the last 168 hours. BNP (last 3 results) No results for input(s): PROBNP in the last 8760 hours. HbA1C: No results for input(s): HGBA1C in the last 72 hours. CBG: Recent Labs  Lab 07/13/19 1716 07/13/19 2033 07/13/19 2332 07/14/19 0831 07/14/19 1353  GLUCAP 79 103* 97 73 90   Lipid Profile: No results for input(s): CHOL, HDL, LDLCALC, TRIG, CHOLHDL, LDLDIRECT in the last 72 hours. Thyroid Function Tests: No results for input(s): TSH, T4TOTAL, FREET4, T3FREE, THYROIDAB in the last 72 hours. Anemia Panel: No results for input(s): VITAMINB12, FOLATE, FERRITIN, TIBC, IRON, RETICCTPCT in the last 72 hours. Sepsis Labs: Recent Labs  Lab 07/07/19 1650 07/07/19 1850  LATICACIDVEN 2.5* 1.7    Recent Results (from the past 240 hour(s))  Blood Culture (routine x 2)     Status: None   Collection Time: 07/07/19  4:45 PM   Specimen: BLOOD LEFT HAND  Result Value Ref Range Status   Specimen Description   Final    BLOOD LEFT HAND Performed at [facility], [place] [address], [place], [place] [address]    Special Requests   Final    BOTTLES DRAWN AEROBIC ONLY Blood Culture results may not be optimal due to an inadequate volume of blood received in culture bottles Performed at [place] [address], [place], [place] [address]    Culture   Final    NO GROWTH 5 DAYS Performed at [facility], [place] [address] [place], [place] [address]    Report Status 07/12/2019 FINAL  Final   Blood Culture (routine x 2)     Status: None   Collection Time: 07/07/19  4:46 PM   Specimen: BLOOD  Result Value Ref Range Status   Specimen Description   Final    BLOOD RIGHT ANTECUBITAL Performed at [facility] [address]  [place], [place] [address]    Special Requests   Final    BOTTLES DRAWN AEROBIC AND ANAEROBIC Blood Culture results may not be optimal due to an inadequate volume of blood received in culture bottles Performed at [place] [address], [place], [place] [address]    Culture   Final    NO GROWTH 5 DAYS Performed at [facility], [place] [address], [place], [place] [address]    Report Status 07/12/2019 FINAL  Final  Respiratory Panel by RT PCR (Flu A&B, Covid) - Nasopharyngeal Swab     Status: None   Collection Time: 07/07/19  5:14 PM   Specimen: Nasopharyngeal Swab  Result Value Ref Range Status   SARS Coronavirus 2 by RT PCR NEGATIVE NEGATIVE Final    Comment: (NOTE) SARS-CoV-2 target nucleic acids are NOT DETECTED. The SARS-CoV-2 RNA is generally detectable in upper respiratoy specimens during the acute phase of infection. The lowest concentration of SARS-CoV-2 viral copies this assay can detect is 131 copies/mL. A negative result does not preclude SARS-Cov-2 infection and should not be used as the sole basis for treatment or other patient management decisions. A negative result may occur with  improper specimen collection/handling, submission of specimen other than nasopharyngeal swab, presence of viral mutation(s) within the areas targeted by this assay, and inadequate number of viral copies (<131 copies/mL). A negative result must be combined with clinical observations, patient history, and epidemiological information. The expected result is Negative. Fact Sheet for Patients:  PinkCheek.be Fact Sheet for Healthcare Providers:  GravelBags.it  This test is not yet ap proved or cleared by the [place] FDA and  has been authorized for detection and/or diagnosis of SARS-CoV-2 by FDA under an Emergency Use Authorization (EUA). This EUA will remain  in effect (meaning this test can be used) for the duration of the COVID-19 declaration under Section 564(b)(1) of the Act, 21 U.S.C. section 360bbb-3(b)(1), unless the authorization is terminated or revoked sooner.    Influenza A by PCR NEGATIVE NEGATIVE Final   Influenza B by PCR NEGATIVE NEGATIVE Final    Comment: (NOTE) The Xpert Xpress SARS-CoV-2/FLU/RSV assay is intended as an aid in  the diagnosis of influenza from Nasopharyngeal swab specimens and  should not be used as a sole basis for treatment. Nasal washings and  aspirates are unacceptable for Xpert Xpress SARS-CoV-2/FLU/RSV  testing. Fact Sheet for Patients: PinkCheek.be Fact Sheet for Healthcare Providers: GravelBags.it This test is not yet approved or cleared by the [place] FDA and  has been authorized for detection and/or diagnosis of SARS-CoV-2 by  FDA under an Emergency Use Authorization (EUA). This EUA will remain  in effect (meaning this test can be used) for the duration of the  Covid-19 declaration under Section 564(b)(1) of the Act, 21  U.S.C. section 360bbb-3(b)(1), unless the authorization is  terminated or revoked. Performed at [facility], [place] [address], [place], [place] [address]   Urine culture     Status: Abnormal   Collection Time: 07/07/19  5:29 PM   Specimen: In/Out Cath Urine  Result Value Ref Range Status   Specimen Description   Final    IN/OUT CATH URINE Performed at [facility] [address], [place], [place] [address]    Special Requests   Final    NONE Performed at [facility], [place] [address] [place],  [address]    Culture MULTIPLE SPECIES PRESENT,  SUGGEST RECOLLECTION (A)  Final  Report Status 07/08/2019 FINAL  Final  MRSA PCR Screening     Status: None   Collection Time: 07/09/19  4:35 PM   Specimen: Nasopharyngeal  Result Value Ref Range Status   MRSA by PCR NEGATIVE NEGATIVE Final    Comment:        The GeneXpert MRSA Assay (FDA approved for NASAL specimens only), is one component of a comprehensive MRSA colonization surveillance program. It is not intended to diagnose MRSA infection nor to guide or monitor treatment for MRSA infections. Performed at [facility], [place] [address], [place], [place] [address]      RN Pressure Injury Documentation: Pressure Injury 07/07/19 Sacrum Mid;Upper Stage 2 -  Partial thickness loss of dermis presenting as a shallow open injury with a red, pink wound bed without slough. 3 each small separate open areas (Active)  07/07/19 2107  Location: Sacrum  Location Orientation: Mid;Upper  Staging: Stage 2 -  Partial thickness loss of dermis presenting as a shallow open injury with a red, pink wound bed without slough.  Wound Description (Comments): 3 each small separate open areas  Present on Admission: Yes     Estimated body mass index is 12.91 kg/m as calculated from the following:   Height as of this encounter: 5\' 11"  (1.803 m).   Weight as of this encounter: 42 kg.  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 Scheduled for percutaneous gastrostomy tube placement. EXAM: IR FLUORO RM 0-60 MIN ANESTHESIA/SEDATION: Versed 1.0 mg, fentanyl 25 mcg. A radiology nurse monitored the patient for moderate  sedation. MEDICATIONS: Glucagon 0.5 mg CONTRAST:  None PROCEDURE: Informed consent was obtained for percutaneous gastrostomy tube. The patient was placed supine on the interventional table. A 5 French orogastric tube was placed with fluoroscopy. Catheter was advanced into the stomach. Anterior abdomen was prepped and draped in sterile fashion. Maximal barrier sterile technique was utilized including caps, mask, sterile gowns, sterile gloves, sterile drape, hand hygiene and skin antiseptic. Stomach was insufflated with air. Skin was anesthetized using 1% lidocaine. At this point in the procedure, there was a power outage and we were unable to regain power with fluoroscopic table and machine. As a result, the gastrostomy tube was aborted. COMPLICATIONS: None immediate FINDINGS: Orogastric tube placed in the stomach. Adequate percutaneous window was identified with fluoroscopy. IMPRESSION: Aborted gastrostomy tube placement due to loss of power within the interventional radiology suite. Patient will be rescheduled for gastrostomy tube placement. Electronically Signed   By: Markus Daft M.D.   On: 07/14/2019 10:15   DG CHEST PORT 1 VIEW  Result Date: 07/14/2019 CLINICAL DATA:  Shortness of breath. EXAM: PORTABLE CHEST 1 VIEW COMPARISON:  Chest x-ray from yesterday. FINDINGS: Unchanged tracheostomy tube. The heart size and mediastinal contours are within normal limits. Normal pulmonary vascularity. Slightly improved consolidation in the left lower lobe. Unchanged mild airspace disease at the right lung base. Unchanged small left pleural effusion. The lungs remain hyperinflated with emphysematous changes. No pneumothorax. No acute osseous abnormality. IMPRESSION: 1. Multifocal pneumonia, slightly improved in the left lower lobe. 2. Unchanged small left pleural effusion. 3. COPD. Electronically Signed   By: Titus Dubin M.D.   On: 07/14/2019 07:36  DG CHEST PORT 1 VIEW  Result Date: 07/13/2019 CLINICAL DATA:   Shortness of breath EXAM: PORTABLE CHEST 1 VIEW COMPARISON:  Jul 11, 2019 FINDINGS: Tracheostomy catheter tip is 5.6 cm above the carina. No pneumothorax. Airspace consolidation throughout the left mid and lower lung zones with associated left pleural effusion noted. There is a small right pleural effusion. Right lung otherwise clear. Heart size and pulmonary vascularity are normal. No adenopathy no bone lesions. IMPRESSION: Airspace consolidation in the mid and lower lung zones on the left with left pleural effusion persists. Small right pleural effusion. No new opacity appreciable. Stable cardiac silhouette. No pneumothorax. Electronically Signed   By: Lowella Grip III M.D.   On: 07/13/2019 08:00   Scheduled Meds: . albuterol  2.5 mg Nebulization TID  . chlorhexidine  15 mL Mouth Rinse BID  . Chlorhexidine Gluconate Cloth  6 each Topical Daily  . glycopyrrolate  0.1 mg Intravenous BID  . mouth rinse  15 mL Mouth Rinse BID  . mouth rinse  15 mL Mouth Rinse q12n4p  . thiamine  100 mg Intravenous Daily   Continuous Infusions: . sodium chloride 30 mL/hr at 07/14/19 0823  . ampicillin-sulbactam (UNASYN) IV Stopped (07/14/19 1041)  . dextrose 75 mL/hr at 07/14/19 1141    LOS: 7 days   Kerney Elbe, DO [facility] PAGER is on AMION  If 7PM-7AM, please contact night-coverage [email or web address]

## 2019-07-14 NOTE — NC FL2 (Signed)
[place] LEVEL OF CARE SCREENING TOOL     IDENTIFICATION  Patient Name: Devon Peterson Birthdate: Mar 22, 1955 Sex: male Admission Date (Current Location): 07/07/2019  [facility] and [place] Number:  Herbalist and Address:  [facility],  [place], [place]      Provider Number: [id number]  Attending Physician Name and Address:  Kerney Elbe, DO  Relative Name and Phone Number:       Current Level of Care: Hospital Recommended Level of Care: [place] Prior Approval Number:    Date Approved/Denied:   PASRR Number: [id number]  Discharge Plan: SNF    Current Diagnoses: Patient Active Problem List   Diagnosis Date Noted  . Tracheostomy care ([place])   . 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 Lung mass   . Neck mass   . Pressure injury of skin 07/08/2019  . Aspiration pneumonia ([place]) 07/07/2019  . Dysphagia 07/07/2019  . Severe protein-calorie malnutrition ([place]) 07/07/2019  . Alcohol use 07/07/2019  . Sepsis ([place]) 04/17/2019  . COPD exacerbation ([place])   . Lactic acidosis   . Tobacco abuse   . Alcohol abuse     Orientation RESPIRATION BLADDER Height & Weight     Self, Time, Situation, Place  Normal Continent Weight: 42 kg Height:  5\' 11"  (180.3 cm)  BEHAVIORAL SYMPTOMS/MOOD NEUROLOGICAL BOWEL NUTRITION STATUS      Continent Diet(regular)  AMBULATORY STATUS COMMUNICATION OF NEEDS Skin   Extensive Assist Verbally Normal                       Personal Care Assistance Level of Assistance  Bathing, Feeding, Dressing Bathing Assistance: Limited assistance Feeding assistance: Limited assistance Dressing Assistance: Limited assistance     Functional Limitations Info  Sight, Hearing, Speech Sight Info: Adequate Hearing Info: Adequate Speech Info: Adequate    SPECIAL CARE FACTORS FREQUENCY  PT (By licensed PT)     PT Frequency: 5 x weekly              Contractures Contractures  Info: Not present    Additional Factors Info  Code Status Code Status Info: full             Current Medications (07/14/2019):  This is the current hospital active medication list Current Facility-Administered Medications  Medication Dose Route Frequency Provider Last Rate Last Admin  . 0.9 %  sodium chloride infusion   Intravenous Continuous Raiford Noble [place], [place] 30 mL/hr at 07/14/19 9381 Rate Verify at 07/14/19 0823  . acetylcysteine (MUCOMYST) 20 % nebulizer / oral solution 2 mL  2 mL Nebulization TID Raiford Noble [place], DO   2 mL at 07/14/19 0850  . albuterol (PROVENTIL) (2.5 MG/3ML) 0.083% nebulizer solution 2.5 mg  2.5 mg Nebulization TID Raiford Noble Latif, DO   2.5 mg at 07/14/19 0850  . ampicillin-sulbactam (UNASYN) 1.5 g in sodium chloride 0.9 % 100 mL IVPB  1.5 g Intravenous Q6H Eudelia Bunch, RPH   Stopped at 07/14/19 0456  . chlorhexidine (PERIDEX) 0.12 % solution 15 mL  15 mL Mouth Rinse BID Noemi Chapel P, DO   15 mL at 07/13/19 2221  . Chlorhexidine Gluconate Cloth 2 % PADS 6 each  6 each Topical Daily Julian Hy, DO   6 each at 07/13/19 1124  . dextrose 10 % infusion   Intravenous Continuous Raiford Noble [place], [place] 75 mL/hr at 07/14/19 0175 Rate Verify at 07/14/19 0823  .  glycopyrrolate (ROBINUL) injection 0.1 mg  0.1 mg Intravenous BID Raiford Noble Latif, DO   0.1 mg at 07/13/19 2221  . ipratropium-albuterol (DUONEB) 0.5-2.5 (3) MG/3ML nebulizer solution 3 mL  3 mL Nebulization Q6H PRN Jerrell [place], MD      . MEDLINE mouth rinse  15 mL Mouth Rinse BID Noemi Chapel P, DO   15 mL at 07/11/19 2129  . MEDLINE mouth rinse  15 mL Mouth Rinse q12n4p Noemi Chapel P, DO   15 mL at 07/13/19 1226  . sodium chloride flush (NS) 0.9 % injection 10-40 mL  10-40 mL Intracatheter PRN Raiford Noble [place], DO   10 mL at 07/11/19 2127  . thiamine (B-1) injection 100 mg  100 mg Intravenous Daily Jerrell Belfast, MD   100 mg at 07/13/19 9470     Discharge  Medications: Please see discharge summary for a list of discharge medications.  Relevant Imaging Results:  Relevant Lab Results:   Additional Information [id number]  Leeroy Cha, RN

## 2019-07-14 NOTE — Procedures (Signed)
Pre procedure Dx: Dysphagia Post Procedure Dx: Same  Successful fluoroscopic guided insertion of gastrostomy tube.   The gastrostomy tube may be used immediately for medications.   Tube feeds may be initiated in 24 hours as per the primary team.    EBL: Trace  Complications: None immediate  Jay Celestina Gironda, MD Pager #: [phone]     

## 2019-07-14 NOTE — Progress Notes (Signed)
Nutrition Follow-up  DOCUMENTATION CODES:   Severe malnutrition in context of chronic illness, Underweight  INTERVENTION:  - once PEG placed, recommend Osmolite 1.5 @ 20 ml/hr to advance by 10 ml every 12 hours to reach goal rate of 50 ml/hr with 30 ml prostat once/day. - at goal rate, this regimen will provide 1900 kcal, 90 grams protein, and 914 ml free water. * weigh patient today.   Monitor magnesium, potassium, and phosphorus daily for at least 3 days, MD to replete as needed, as pt is at risk for refeeding syndrome given severe malnutrition, no nutrition for at least 1 week.   NUTRITION DIAGNOSIS:   Severe Malnutrition related to chronic illness(COPD) as evidenced by severe fat depletion, severe muscle depletion. -ongoing  GOAL:   Patient will meet greater than or equal to 90% of their needs -unable to meet  MONITOR:   Labs, Weight trends, Skin, Other (Comment)(PEG placement and initiation of TF)  ASSESSMENT:   64 year old male with past medical history of COPD, polysubstance abuse, recent admission 2/12-2/14 for sepsis secondary to pneumonia and COPD exacerbation presented with SOB, fatigue, and unintentional wt loss. Family reports patient has done poorly s/p returning home from hospital, has difficulty swallowing food, spitting up saliva, and has chronic cough. CXR revealed new left lower lobe airspace opacity concerning for pneumonia, CT angiogram showed abnormal soft tissue in hypopharynx suspicious for underlying mass, and dense bilateral lower lobe consolidation.  Patient remains NPO since admission on 5/4. Plan was for PEG by IR yesterday (5/10), but power to fluoro machine was lost yesterday so procedure had to be postponed. Able to talk with RN who reports she has not received communication for when PEG will be re-scheduled for.   Patient laying in bed and able to nod/shake head to communicate. He denies any discomfort at this time. He has not been weighed since 5/6.  Flow sheet documentation indicates deep pitting edema to BLE.   Per notes:  - sepsis 2/2 aspiration PNA - s/p trach on 5/6 - s/p MBS with recommendation for NPO--pending PEG placement - poorly differentiated squamous cell carcinoma  - afib with RVR--improved - hypotension--improving - generalized weakness and physical deconditioning - mild hypokalemia and mild hyponatremia    Labs reviewed; CBG: 73 mg/dl, Na: 133 mmol/l, K: 3.4 mmol/l, BUN: <5 mg/dl, creatinine: 0.46 mg/dl, Ca: 7.9 mg/dl. Medications reviewed; 2 g IV Mg sulfate x1 run 5/10, 10 mEq IV KCl x4 runs 5/10, 100 mg IV thiamine/day.  IVF; NS @ 30 mmol/l; D10 @ 75 ml/hr (612 kcal).   Diet Order:   Diet Order            Diet NPO time specified  Diet effective now              EDUCATION NEEDS:   Not appropriate for education at this time  Skin:  Skin Assessment: Skin Integrity Issues: Skin Integrity Issues:: Stage II, Incisions Stage II: mid sacrum Incisions: neck for trach (5/6)  Last BM:  5/11  Height:   Ht Readings from Last 1 Encounters:  07/09/19 5\' 11"  (1.803 m)    Weight:   Wt Readings from Last 1 Encounters:  07/09/19 42 kg     Estimated Nutritional Needs:   Kcal:  1700-1900  Protein:  80-100  Fluid:  >/= 1.7 L/day     Jarome Matin, MS, RD, LDN, CNSC Inpatient Clinical Dietitian RD pager # available in [place]  After hours/weekend pager # available in [facility]

## 2019-07-14 NOTE — TOC Progression Note (Signed)
Transition of Care [facility]) - Progression Note    Patient Details  Name: Devon Peterson MRN: [id number] Date of Birth: [birth year]  [facility] CM/SW Contact  Leeroy Cha, RN Phone Number: 07/14/2019, 8:49 AM  Clinical Narrative:    195093/OIZ tracheostomy on [id number] due to hypopharyngeal tumor and obstruction of airway. Nebs, Iv unasyn, Iv d10 Bun <5,creat0.46.  Expected Discharge Plan: Home/Self Care Barriers to Discharge: No Barriers Identified  Expected Discharge Plan and Services Expected Discharge Plan: Home/Self Care                                               Social Determinants of Health (SDOH) Interventions    Readmission Risk Interventions No flowsheet data found.

## 2019-07-15 DIAGNOSIS — L89152 Pressure ulcer of sacral region, stage 2: Secondary | ICD-10-CM

## 2019-07-15 DIAGNOSIS — R1312 Dysphagia, oropharyngeal phase: Secondary | ICD-10-CM

## 2019-07-15 LAB — CBC WITH DIFFERENTIAL/PLATELET
Abs Immature Granulocytes: 0.01 10*3/uL (ref 0.00–0.07)
Basophils Absolute: 0 10*3/uL (ref 0.0–0.1)
Basophils Relative: 0 %
Eosinophils Absolute: 0 10*3/uL (ref 0.0–0.5)
Eosinophils Relative: 1 %
HCT: 28.4 % — ABNORMAL LOW (ref 39.0–52.0)
Hemoglobin: 9.5 g/dL — ABNORMAL LOW (ref 13.0–17.0)
Immature Granulocytes: 0 %
Lymphocytes Relative: 28 %
Lymphs Abs: 1.3 10*3/uL (ref 0.7–4.0)
MCH: 28.3 pg (ref 26.0–34.0)
MCHC: 33.5 g/dL (ref 30.0–36.0)
MCV: 84.5 fL (ref 80.0–100.0)
Monocytes Absolute: 0.3 10*3/uL (ref 0.1–1.0)
Monocytes Relative: 6 %
Neutro Abs: 3.1 10*3/uL (ref 1.7–7.7)
Neutrophils Relative %: 65 %
Platelets: 281 10*3/uL (ref 150–400)
RBC: 3.36 MIL/uL — ABNORMAL LOW (ref 4.22–5.81)
RDW: 12.6 % (ref 11.5–15.5)
WBC: 4.7 10*3/uL (ref 4.0–10.5)
nRBC: 0 % (ref 0.0–0.2)

## 2019-07-15 LAB — COMPREHENSIVE METABOLIC PANEL
ALT: 9 U/L (ref 0–44)
AST: 15 U/L (ref 15–41)
Albumin: 2.2 g/dL — ABNORMAL LOW (ref 3.5–5.0)
Alkaline Phosphatase: 47 U/L (ref 38–126)
Anion gap: 7 (ref 5–15)
BUN: <5 mg/dL — ABNORMAL LOW (ref 8–23)
CO2: 27 mmol/L (ref 22–32)
Calcium: 8.2 mg/dL — ABNORMAL LOW (ref 8.9–10.3)
Chloride: 98 mmol/L (ref 98–111)
Creatinine, Ser: 0.47 mg/dL — ABNORMAL LOW (ref 0.61–1.24)
GFR calc Af Amer: >60 mL/min (ref >60)
GFR calc non Af Amer: >60 mL/min (ref >60)
Glucose, Bld: 128 mg/dL — ABNORMAL HIGH (ref 70–99)
Potassium: 3.2 mmol/L — ABNORMAL LOW (ref 3.5–5.1)
Sodium: 132 mmol/L — ABNORMAL LOW (ref 135–145)
Total Bilirubin: 0.3 mg/dL (ref 0.3–1.2)
Total Protein: 5.5 g/dL — ABNORMAL LOW (ref 6.5–8.1)

## 2019-07-15 LAB — GLUCOSE, CAPILLARY
Glucose-Capillary: 123 mg/dL — ABNORMAL HIGH (ref 70–99)
Glucose-Capillary: 124 mg/dL — ABNORMAL HIGH (ref 70–99)
Glucose-Capillary: 146 mg/dL — ABNORMAL HIGH (ref 70–99)
Glucose-Capillary: 125 mg/dL — ABNORMAL HIGH (ref 70–99)
Glucose-Capillary: 123 mg/dL — ABNORMAL HIGH (ref 70–99)

## 2019-07-15 LAB — MAGNESIUM: Magnesium: 1.6 mg/dL — ABNORMAL LOW (ref 1.7–2.4)

## 2019-07-15 LAB — PHOSPHORUS: Phosphorus: 2.3 mg/dL — ABNORMAL LOW (ref 2.5–4.6)

## 2019-07-15 MED ORDER — OSMOLITE 1.5 CAL PO LIQD
1000.0000 mL | ORAL | Status: DC
  Administered 2019-07-15 – 2019-07-22 (×7): 1000 mL
  Filled 2019-07-15 (×8): qty 1000

## 2019-07-15 MED ORDER — MAGNESIUM SULFATE 2 GM/50ML IV SOLN
2.0000 g | Freq: Once | INTRAVENOUS | Status: DC
  Filled 2019-07-15: qty 50

## 2019-07-15 MED ORDER — POTASSIUM PHOSPHATES 15 MMOLE/5ML IV SOLN
30.0000 mmol | Freq: Once | INTRAVENOUS | Status: AC
  Administered 2019-07-15: 30 mmol via INTRAVENOUS
  Filled 2019-07-15: qty 10

## 2019-07-15 MED ORDER — LABETALOL HCL 5 MG/ML IV SOLN
10.0000 mg | INTRAVENOUS | Status: DC | PRN
  Administered 2019-07-15: 10 mg via INTRAVENOUS
  Filled 2019-07-15: qty 4

## 2019-07-15 MED ORDER — PRO-STAT SUGAR FREE PO LIQD
30.0000 mL | Freq: Every day | ORAL | Status: DC
  Administered 2019-07-15 – 2019-07-29 (×15): 30 mL
  Filled 2019-07-15 (×15): qty 30

## 2019-07-15 MED ORDER — POTASSIUM CHLORIDE 10 MEQ/100ML IV SOLN
10.0000 meq | INTRAVENOUS | Status: AC
  Administered 2019-07-15 (×3): 10 meq via INTRAVENOUS
  Filled 2019-07-15 (×3): qty 100

## 2019-07-15 MED ORDER — SODIUM CHLORIDE 0.9 % IV SOLN
1.0000 mg | Freq: Once | INTRAVENOUS | Status: AC
  Administered 2019-07-15: 1 mg via INTRAVENOUS
  Filled 2019-07-15: qty 0.2

## 2019-07-15 MED ORDER — POTASSIUM CHLORIDE 10 MEQ/100ML IV SOLN
INTRAVENOUS | Status: AC
  Filled 2019-07-15: qty 100

## 2019-07-15 MED ORDER — MAGNESIUM SULFATE IN D5W 1-5 GM/100ML-% IV SOLN
1.0000 g | Freq: Once | INTRAVENOUS | Status: AC
  Administered 2019-07-15: 1 g via INTRAVENOUS
  Filled 2019-07-15: qty 100

## 2019-07-15 NOTE — Telephone Encounter (Signed)
MR, is pt going to be having a CT done while still in hospital or is this supposed to be scheduled once pt is discharged?

## 2019-07-15 NOTE — Progress Notes (Signed)
PROGRESS NOTE    Devon Peterson  [id number] DOB: [birth year] DOA: 07/07/2019 PCP: Patient, No Pcp Per   Brief Narrative:  Devon Peterson a 64 y.o.BM PMHx COPD,Polysubstance abuse (EtOH, tobacco, Cocaine), hospital admission 04/17/2019-04/19/2019 for sepsis secondary to pneumonia and COPD exacerbation  Presenting to the ED via EMS for evaluation of cough, shortness of breath, fatigue, and unintentional weight loss.History provided by patient and his nephew at bedside. Nephew states patient was admitted to the hospital a month ago for pneumonia and since then has continued to do poorly. He has difficulty swallowing food and has been spitting up saliva. He is coughing a lot. Patient denies fevers, chills, shortness of breath, chest pain, nausea, vomiting, abdominal pain, diarrhea, or dysuria. Nephew states patient has been smoking a pack of cigarettes daily since a very young age. He has lost a lot of weight recently. Addendum: 07/07/2019 8:40 PM. Patient also denied history of hematemesis, hematochezia, or melena.  ED Course:Tachycardic on arrival with heart rate in the 150s, appeared to be sinus rhythm. Afebrile. Slightly tachypneic. Labs showing no leukocytosis. Initial lactic acid 2.5, repeat after IV fluid pending. Hemoglobin 12.7, stable compared to labs done in February 2021. Platelet count normal. BUN 27, creatinine 0.8. Albumin 3.3. LFTs normal. Blood culture x2 pending. SARS-CoV-2 PCR test negative. Influenza panel negative. UA not suggestive of infection. Urine culture pending. Chest x-ray showing new left lower lobe airspace opacity concerning for pneumonia. CT angiogram chest negative for PE. Showing abnormal soft tissue in the hypopharynx suspicious for underlying mass. Dense bilateral lower lobe consolidation, left greater than right. Aspiration suspected given findings in the hypopharynx. Also showing evidence of an enhancing mass within the densely consolidated  left lower lobe, metastatic disease or primary neoplasm not excluded. Marked cachexia.  Patient received ceftriaxone, azithromycin, and 2 L normal saline boluses.  **Interim History He was evaluated by ENT who recommended a tracheostomy.  Tracheostomy was done on 07/10/19 and postoperatively he ended up on pressors which were weaned off.  He is also getting a tissue biopsy and medical oncology as well as radiation oncology were consulted for further evaluation.  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 Postoperatively he had hypotension and went in A. fib with RVR and started on Neo-Synephrine drip has been stopped.  Critical care was consulted for further evaluation and his hypotension is improved.  We will continue IV fluid but patient needs long-term nutrition needs and needs a PEG tube and have called IR but they will reevaluate Monday for a PEG tube given the national shortage of PEG tube.  On 07/11/19 Patient's tracheostomy was doing okay but he was having a lot of secretions but not as much.  Again radiation oncology wants to have improvement in the patient's performance status as well as nutritional status as well as further evaluation of the lung mass with likely pet imaging as an outpatient and potential biopsy of lung lesion to determine if this is metastatic versus a second primary.  This will help determine radiation oncology if treatment will be of curative or more of a palliative approach.  They will continue to collaborate with medical oncology  On 07/12/19 he continued to have significant amount of secretions however they are becoming thicker we will try Mucomyst as well as Robinul to thin the secretions.  He denies any current pain at this time but was very weak and is severely deconditioned so PT OT was reconsulted again.   Subjective: Alert, nods yes and no to questions,  do thumbs up and thumbs down to questions.  Negative CP, negative abdominal pain, negative S  OB.   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  -Also showing evidence of an enhancing mass within the densely consolidated left lower lobe, metastatic disease or primary neoplasm not excluded.  -Patient is severely cachectic. Endorsing dysphagia and unintentional weight loss.  -Longstanding history of smoking cigarettes. -Ordered CT soft tissue neck with contrast and showed "Suboptimal evaluation due to motion artifact and poor soft tissue resolution in the setting of significant cachexia and probable diffuse edema. Enhancing hypopharyngeal soft tissue in the post cricoid region extending superiorly with effacement of the piriform sinuses and thickening of the aryepiglottic folds. Malignancy is suspected and direct visual inspection is recommended. Extension of pneumonia into the left upper lobe. Probable small left pleural effusion. Maxillary sinus air-fluid levels, a nonspecific finding that can reflect acute sinusitis in the appropriate setting." -CT of the abdomen pelvis showed "Interval worsening of bibasilar airspace consolidation left base worse than right likely due to pneumonia. Stable rim enhancing low-density focus over the left base consolidation which may be due to necrosis, pulmonary abscess or underlying malignancy.  No evidence of metastatic disease within the abdomen. Bilateral streaky cortical low-attenuation over the kidneys which can be seen in  pyelonephritis. Recommend clinical correlation. Aortic Atherosclerosis (ICD10-I70.0). 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  They will arrange follow-up for the left lung lesion and arrange for biopsy of the masslike component persists and they are going to discuss the diagnosis of laryngeal cancer treatment options and they will reevaluate 510 -Radiation oncology also followed up today and they want his nutritional status a little bit better before they discussed radiation treatments as I mentioned above  -Pulmonary feels that the patient will likely be trach dependent and they recommend continuing trach collar: Initially recommend keeping the cuff inflated related to his bleeding but now they are recommending that it is okay to deflate the cuff and attempt PMV trials that is bleeding appears to have resolved -Pulmonary also recommends continuing with Mucomyst and continuing ATC as tolerated and they feel that he will remain trach dependent as mentioned -SLP consulted to do PMSV trials and will be done after his cuff is deflated -Dr. Wilburn Cornelia of ENT is on board  and appreciate assistance -Having a lot of Secretions so  will add Acetylcysteine 2 mL TID and will add Glycopyrrolate 0.1 mg IV BID -Continue to Frequently Suction  COPD -See aspiration pneumonia  Dysphagia -5/11 PEG tube placed -Osmolite 1.5 @ 20 ml/hr to advance by 10 ml every 12 hours to reach goal rate of 50 ml/hr -30 ml prostat once/day.  A. fib with RVR -Went into A. fib with RVR in the PACU and was given IV metoprolol -Heart rates have improved some but he is hypotensive and had to be placed on a Neo-Synephrine drip -We will not anticoagulate at this time but may need anticoagulation soon -5/12 currently NSR  Hypotension -Resolved  Severe protein calorie malnutrition/Underweight -See dysphagia   Generalized weakness/physical deconditioning: -PT and OT initial evaluation recommending Home Health PT but re-evaluation recommending SNF now given his condition  EtOH abuse -Folic acid 1 mg daily -Thiamine 100 mg daily -Currently no signs or symptoms of withdrawal.  Cocaine abuse -5/5 positive cocaine -Avoid beta-blockers, although if absolutely required for control of HTN this far out would be safe  Tobacco abuse -NicoDerm patch -Counseled on need for absolute cessation  Hypoglycemia -In the setting of poor p.o. intake and was persistent -An outpatient on tube feeds should resolve -D10 53ml/hr, will expect to be able to discontinue my 5/13 as his tube feeds increase.  Hypokalemia -Potassium goal> 4 -K-Phos 30 mmol -Potassium IV 30 mEq -Repeat BMP,Mg PO4@ 1600    Hypomagnesmia -Magnesium goal> 2 -Magnesium IV 3 g  Hypophosphatemia -See hypokalemia  Hyponatremia -Mild, iatrogenic.  Improved with addition of normal saline 24ml/hr  Normocytic Anemia -Patient's hemoglobin/hematocit is dropping slowly and went from 11.3/34.3 -> 10.0/29.5 -> 9.2/27.0 -Check anemia panel and showed an iron level of 30, U IBC 130, TIBC 150, saturation ratios of 19%, ferritin level 421, folate level 11.5, and vitamin B12 of  281 -Continue to monitor for signs and symptoms of bleeding; currently had a lot of bleeding from his trach site to be injected with lidocaine   Stage II sacral pressure ulcer (POA Pressure Injury 07/07/19 Sacrum Mid;Upper Stage 2 -  Partial thickness loss of dermis presenting as a shallow open injury with a red, pink wound bed without slough. 3 each small separate open areas (Active)  07/07/19 2107  Location: Sacrum  Location Orientation: Mid;Upper  Staging: Stage 2 -  Partial thickness loss of dermis presenting as a shallow open injury with a red, pink wound bed without slough.  Wound Description (Comments): 3 each small separate open areas  Present on Admission: Yes  -Continue with Mepilex pads and frequent turning    Goals of care 5/12 palliative care consult; patient with poorly differentiated squamous carcinoma, extremely cachectic now trach and PEG dependent.  Evaluate for change of CODE STATUS, consider hospice --PT OT recommending SNF.  Will await palliative care recommendations   DVT prophylaxis: SCD Code Status: Full Family Communication:  Disposition Plan:  Status is: Inpatient  Dispo: The patient is from: Home              Anticipated d/c is to: SNF?  Hospice?              Anticipated d/c date is: 07/22/2019              Patient currently unstable      Consultants:  PCCM   Procedures/Significant Events:  -CT showing dense bilateral lower lobe consolidation, left greater than right suspicious for aspiration pneumonia given given findings  suspicious for an abnormal soft tissue mass in the hypopharynx. -SARS-CoV-2 PCR test negative. Influenza panel negative.  -Sepsis physiology has improved but he is now hypotensive and see below -Currently satting in the low to mid 90s on room air. -Heart rate has improved after IV fluid boluses but post operatively he went into A Fib with RVR and is now improved  -Continue IV fluid hydration and antibiotic coverage with  Unasyn will continue for total 7 days. Today is Day 7/7 and will Stop Abx  -Trend lactate.  -Blood culture x2 showed no growth today at 5 days -CT Neck showed "Extension of pneumonia into the left upper lobe. Probable small left pleural effusion. Maxillary sinus air-fluid levels, a nonspecific finding that can reflect acute sinusitis in the appropriate setting." -CT Abd and Pelvis showed "Interval worsening of bibasilar airspace consolidation left base worse than right likely due to pneumonia. Stable rim enhancing low-density focus over the left base consolidation which may be due to necrosis, pulmonary abscess or underlying malignancy.  No evidence of metastatic disease within the abdomen. Bilateral streaky cortical low-attenuation over the kidneys which can be seen in pyelonephritis. Recommend clinical correlation. Aortic Atherosclerosis (ICD10-I70.0). Findings compatible patient's clinical cachexia with significant decreased subcutaneous and mesenteric fat."    I have personally reviewed and interpreted all radiology studies and my findings are as above.  VENTILATOR SETTINGS:    Cultures   Antimicrobials: Anti-infectives (From admission, onward)   Start     Dose/Rate Stop   07/07/19 2200  ampicillin-sulbactam (UNASYN) 1.5 g in sodium chloride 0.9 % 100 mL IVPB     1.5 g 200 mL/hr over 30 Minutes 07/14/19 1606   07/07/19 1930  metroNIDAZOLE (FLAGYL) IVPB 500 mg  Status:  Discontinued     500 mg 100 mL/hr over 60 Minutes 07/07/19 2003   07/07/19 1800  cefTRIAXone (ROCEPHIN) 1 g in sodium chloride 0.9 % 100 mL IVPB     1 g 200 mL/hr over 30 Minutes 07/07/19 1830   07/07/19 1800  azithromycin (ZITHROMAX) 500 mg in sodium chloride 0.9 % 250 mL IVPB     500 mg 250 mL/hr over 60 Minutes 07/07/19 1855       Devices    LINES / TUBES:      Continuous Infusions: . sodium chloride 30 mL/hr at 07/15/19 0500  . dextrose 75 mL/hr at 07/15/19 0500  . magnesium sulfate bolus IVPB     . potassium chloride       Objective: Vitals:   07/15/19 0313 07/15/19 0400 07/15/19 0807 07/15/19 0812  BP: 139/87 125/81    Pulse: 64 65    Resp: 14 17    Temp: (!) 97.5 F (36.4 C)     TempSrc: Oral     SpO2: 100% 100% 100% 100%  Weight:      Height:        Intake/Output Summary (Last 24 hours) at 07/15/2019 0843 Last data filed at 07/15/2019 0500 Gross per 24 hour  Intake 2155.01 ml  Output 1650 ml  Net 505.01 ml   Filed Weights   07/07/19 2049 07/09/19 0949  Weight: 42 kg 42 kg    Examination:  General: Alert, nods head yes and no to questions appropriately, positive acute respiratory distress, cachectic Eyes: negative scleral hemorrhage, negative anisocoria, negative icterus ENT: Negative Runny nose, negative gingival bleeding, Neck:  Negative scars, masses, torticollis, lymphadenopathy, JVD #4 cuffed trach in place negative sign of infection, Lungs: Clear to auscultation bilaterally without wheezes  or crackles Cardiovascular: Regular rate and rhythm without murmur gallop or rub normal S1 and S2 Abdomen: negative abdominal pain, nondistended, positive soft, bowel sounds, no rebound, no ascites, no appreciable mass Extremities: No significant cyanosis, clubbing, or edema bilateral lower extremities Skin: Negative rashes, lesions, ulcers Psychiatric:  Negative depression, negative anxiety, negative fatigue, negative mania  Central nervous system:  Cranial nerves II through XII intact, tongue/uvula midline, all extremities muscle strength 5/5, sensation intact throughout,  .     Data Reviewed: Care during the described time interval was provided by me .  I have reviewed this patient's available data, including medical history, events of note, physical examination, and all test results as part of my evaluation.   CBC: Recent Labs  Lab 07/11/19 0238 07/12/19 0330 07/13/19 0412 07/14/19 0240 07/15/19 0352  WBC 5.9 5.9 5.7 4.3 4.7  NEUTROABS 3.5 4.1 4.1 2.6  3.1  HGB 11.3* 10.0* 10.0* 9.2* 9.5*  HCT 34.3* 29.5* 29.5* 27.0* 28.4*  MCV 86.4 81.9 83.3 83.3 84.5  PLT 212 208 212 227 481   Basic Metabolic Panel: Recent Labs  Lab 07/11/19 0238 07/11/19 0841 07/11/19 1958 07/12/19 0330 07/13/19 0412 07/14/19 0240 07/15/19 0352  NA 139  --  133* 130* 132* 133* 132*  K 3.3*   < > 4.2 3.1* 3.5 3.4* 3.2*  CL 102  --  93* 95* 95* 98 98  CO2 27  --  27 26 29 26 27   GLUCOSE 85  --  234* 98 84 106* 128*  BUN 7*  --  5* <5* <5* <5* <5*  CREATININE 0.40*  --  0.51* 0.40* 0.45* 0.46* 0.47*  CALCIUM 8.4*  --  8.5* 8.1* 8.1* 7.9* 8.2*  MG 2.1  --   --  1.2* 1.7 1.9 1.6*  PHOS 2.4*  --   --  2.4* 3.5 3.0 2.3*   < > = values in this interval not displayed.   GFR: Estimated Creatinine Clearance: 55.4 mL/min (A) (by C-G formula based on SCr of 0.47 mg/dL (L)). Liver Function Tests: Recent Labs  Lab 07/11/19 0238 07/12/19 0330 07/13/19 0412 07/14/19 0240 07/15/19 0352  AST 17 16 14* 12* 15  ALT 9 8 9 9 9   ALKPHOS 51 50 52 51 47  BILITOT 0.7 0.7 0.6 0.4 0.3  PROT 5.5* 5.3* 5.5* 5.2* 5.5*  ALBUMIN 2.4* 2.2* 2.2* 2.0* 2.2*   No results for input(s): LIPASE, AMYLASE in the last 168 hours. No results for input(s): AMMONIA in the last 168 hours. Coagulation Profile: Recent Labs  Lab 07/13/19 0455  INR 1.1   Cardiac Enzymes: No results for input(s): CKTOTAL, CKMB, CKMBINDEX, TROPONINI in the last 168 hours. BNP (last 3 results) No results for input(s): PROBNP in the last 8760 hours. HbA1C: No results for input(s): HGBA1C in the last 72 hours. CBG: Recent Labs  Lab 07/14/19 1734 07/14/19 1939 07/14/19 2315 07/15/19 0421 07/15/19 0821  GLUCAP 116* 86 129* 123* 124*   Lipid Profile: No results for input(s): CHOL, HDL, LDLCALC, TRIG, CHOLHDL, LDLDIRECT in the last 72 hours. Thyroid Function Tests: No results for input(s): TSH, T4TOTAL, FREET4, T3FREE, THYROIDAB in the last 72 hours. Anemia Panel: No results for input(s): VITAMINB12,  FOLATE, FERRITIN, TIBC, IRON, RETICCTPCT in the last 72 hours. Urine analysis:    Component Value Date/Time   COLORURINE YELLOW 07/07/2019 1729   APPEARANCEUR HAZY (A) 07/07/2019 1729   LABSPEC 1.016 07/07/2019 1729   PHURINE 5.0 07/07/2019 1729   GLUCOSEU NEGATIVE 07/07/2019 1729  HGBUR NEGATIVE 07/07/2019 1729   BILIRUBINUR NEGATIVE 07/07/2019 1729   KETONESUR NEGATIVE 07/07/2019 1729   PROTEINUR 30 (A) 07/07/2019 1729   UROBILINOGEN 1.0 10/28/2008 0458   NITRITE NEGATIVE 07/07/2019 1729   LEUKOCYTESUR NEGATIVE 07/07/2019 1729   Sepsis Labs: @LABRCNTIP (procalcitonin:4,lacticidven:4)  ) Recent Results (from the past 240 hour(s))  Blood Culture (routine x 2)     Status: None   Collection Time: 07/07/19  4:45 PM   Specimen: BLOOD LEFT HAND  Result Value Ref Range Status   Specimen Description   Final    BLOOD LEFT HAND Performed at [facility], [place] [address] [place], [place] [address]    Special Requests   Final    BOTTLES DRAWN AEROBIC ONLY Blood Culture results may not be optimal due to an inadequate volume of blood received in culture bottles Performed at [place] [address], [place], [place] [address]    Culture   Final    NO GROWTH 5 DAYS Performed at [facility], [place] [address] [place], [place] [address]    Report Status 07/12/2019 FINAL  Final  Blood Culture (routine x 2)     Status: None   Collection Time: 07/07/19  4:46 PM   Specimen: BLOOD  Result Value Ref Range Status   Specimen Description   Final    BLOOD RIGHT ANTECUBITAL Performed at [facility] [address] [place], [place] [address]    Special Requests   Final    BOTTLES DRAWN AEROBIC AND ANAEROBIC Blood Culture results may not be optimal due to an inadequate volume of blood received in culture bottles Performed at [facility] [place] [address], [place], [place] [address]    Culture   Final    NO  GROWTH 5 DAYS Performed at [facility], [place] [address] [place], [place] [address]    Report Status 07/12/2019 FINAL  Final  Respiratory Panel by RT PCR (Flu A&B, Covid) - Nasopharyngeal Swab     Status: None   Collection Time: 07/07/19  5:14 PM   Specimen: Nasopharyngeal Swab  Result Value Ref Range Status   SARS Coronavirus 2 by RT PCR NEGATIVE NEGATIVE Final    Comment: (NOTE) SARS-CoV-2 target nucleic acids are NOT DETECTED. The SARS-CoV-2 RNA is generally detectable in upper respiratoy specimens during the acute phase of infection. The lowest concentration of SARS-CoV-2 viral copies this assay can detect is 131 copies/mL. A negative result does not preclude SARS-Cov-2 infection and should not be used as the sole basis for treatment or other patient management decisions. A negative result may occur with  improper specimen collection/handling, submission of specimen other than nasopharyngeal swab, presence of viral mutation(s) within the areas targeted by this assay, and inadequate number of viral copies (<131 copies/mL). A negative result must be combined with clinical observations, patient history, and epidemiological information. The expected result is Negative. Fact Sheet for Patients:  PinkCheek.be Fact Sheet for Healthcare Providers:  GravelBags.it This test is not yet ap proved or cleared by the [place] FDA and  has been authorized for detection and/or diagnosis of SARS-CoV-2 by FDA under an Emergency Use Authorization (EUA). This EUA will remain  in effect (meaning this test can be used) for the duration of the COVID-19 declaration under Section 564(b)(1) of the Act, 21 U.S.C. section 360bbb-3(b)(1), unless the authorization is terminated or revoked sooner.    Influenza A by PCR NEGATIVE NEGATIVE Final  Influenza B by PCR NEGATIVE NEGATIVE Final    Comment: (NOTE) The Xpert Xpress  SARS-CoV-2/FLU/RSV assay is intended as an aid in  the diagnosis of influenza from Nasopharyngeal swab specimens and  should not be used as a sole basis for treatment. Nasal washings and  aspirates are unacceptable for Xpert Xpress SARS-CoV-2/FLU/RSV  testing. Fact Sheet for Patients: PinkCheek.be Fact Sheet for Healthcare Providers: GravelBags.it This test is not yet approved or cleared by the [place] FDA and  has been authorized for detection and/or diagnosis of SARS-CoV-2 by  FDA under an Emergency Use Authorization (EUA). This EUA will remain  in effect (meaning this test can be used) for the duration of the  Covid-19 declaration under Section 564(b)(1) of the Act, 21  U.S.C. section 360bbb-3(b)(1), unless the authorization is  terminated or revoked. Performed at [facility], [place] [address], [place], [place] [address]   Urine culture     Status: Abnormal   Collection Time: 07/07/19  5:29 PM   Specimen: In/Out Cath Urine  Result Value Ref Range Status   Specimen Description   Final    IN/OUT CATH URINE Performed at [facility] [address], [place], [place] [address]    Special Requests   Final    NONE Performed at [facility], [place] [address] [place], [place] [address]    Culture MULTIPLE SPECIES PRESENT, SUGGEST RECOLLECTION (A)  Final   Report Status 07/08/2019 FINAL  Final  MRSA PCR Screening     Status: None   Collection Time: 07/09/19  4:35 PM   Specimen: Nasopharyngeal  Result Value Ref Range Status   MRSA by PCR NEGATIVE NEGATIVE Final    Comment:        The GeneXpert MRSA Assay (FDA approved for NASAL specimens only), is one component of a comprehensive MRSA colonization surveillance program. It is not intended to diagnose MRSA infection nor to guide or monitor treatment for MRSA infections. Performed at [facility], [place] [address] [place], [place] [address]          Radiology Studies: IR GASTROSTOMY TUBE MOD SED  Result Date: 07/14/2019 INDICATION: History of lymphoma and neck mass with tracheostomy. Please perform gastrostomy tube placement for enteric nutrition supplementation purposes. EXAM: PUSH GASTROSTOMY TUBE PLACEMENT COMPARISON:  CT abdomen pelvis-07/08/2019 MEDICATIONS: Patient is currently admitted to the hospital receiving intravenous antibiotics; Antibiotics were administered within 1 hour of the procedure. CONTRAST:  15 mL of Isovue 300 administered into the gastric lumen. ANESTHESIA/SEDATION: Moderate (conscious) sedation was employed during this procedure. A total of Versed 1 mg and Fentanyl 25 mcg was administered intravenously. Moderate Sedation Time: 12 minutes. The patient's level of consciousness and vital signs were monitored continuously by radiology nursing throughout the procedure under my direct supervision. FLUOROSCOPY TIME:  1 minute, 12 seconds (6 mGy) COMPLICATIONS: None immediate. PROCEDURE: Informed written consent was obtained from the patient following explanation of the procedure, risks, benefits and alternatives. A time out was performed prior to the initiation of the procedure. Ultrasound scanning was performed to demarcate the edge of the left lobe of the liver. Maximal barrier sterile technique utilized including caps, mask, sterile gowns, sterile gloves, large sterile drape, hand hygiene and Betadine prep. The left upper quadrant was sterilely prepped and draped. A oral gastric catheter was inserted into the stomach under fluoroscopy. The existing nasogastric feeding tube was removed. The left costal margin was marked. Air was injected into the stomach for  insufflation and visualization under fluoroscopy. Under sterile conditions and local anesthesia, 3 T tacks were utilized to pexy the anterior aspect of the stomach against the ventral abdominal wall. Contrast  injection confirmed appropriate positioning of each of the T tacks. An incision was made between the T tacks and a 17 gauge trocar needle was utilized to access the stomach. Needle position was confirmed within the stomach with aspiration of air and injection of a small amount of contrast. Next, over an Amplatz wire, track was serially dilated ultimately allowing placement a peel-away sheath and an 18-French balloon retention gastrostomy tube. The retention balloon was insufflated with a mixture of dilute saline and contrast and pulled taut against the anterior wall of the stomach. The external disc was cinched. Contrast injection confirms positioning within the stomach. Several spot radiographic images were obtained in various obliquities for documentation. The patient tolerated procedure well without immediate post procedural complication. FINDINGS: After successful fluoroscopic guided placement, the gastrostomy tube is appropriately positioned with internal retention balloon against the ventral aspect of the gastric lumen. IMPRESSION: Successful fluoroscopic insertion of an 36 French balloon retention gastrostomy tube. The gastrostomy may be used immediately for medication administration and in 24 hrs for the initiation of feeds. Electronically Signed   By: Sandi Mariscal M.D.   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 Scheduled for percutaneous gastrostomy tube placement. EXAM: IR FLUORO RM 0-60 MIN ANESTHESIA/SEDATION: Versed 1.0 mg, fentanyl 25 mcg. A radiology nurse monitored the patient for moderate sedation. MEDICATIONS: Glucagon 0.5 mg CONTRAST:  None PROCEDURE: Informed consent was obtained for percutaneous gastrostomy tube. The patient was placed supine on the interventional table. A 5 French orogastric tube was placed with fluoroscopy. Catheter was advanced into the stomach. Anterior abdomen was  prepped and draped in sterile fashion. Maximal barrier sterile technique was utilized including caps, mask, sterile gowns, sterile gloves, sterile drape, hand hygiene and skin antiseptic. Stomach was insufflated with air. Skin was anesthetized using 1% lidocaine. At this point in the procedure, there was a power outage and we were unable to regain power with fluoroscopic table and machine. As a result, the gastrostomy tube was aborted. COMPLICATIONS: None immediate FINDINGS: Orogastric tube placed in the stomach. Adequate percutaneous window was identified with fluoroscopy. IMPRESSION: Aborted gastrostomy tube placement due to loss of power within the interventional radiology suite. Patient will be rescheduled for gastrostomy tube placement. Electronically Signed   By: Markus Daft M.D.   On: 07/14/2019 10:15   DG CHEST PORT 1 VIEW  Result Date: 07/14/2019 CLINICAL DATA:  Shortness of breath. EXAM: PORTABLE CHEST 1 VIEW COMPARISON:  Chest x-ray from yesterday. FINDINGS: Unchanged tracheostomy tube. The heart size and mediastinal contours are within normal limits. Normal pulmonary vascularity. Slightly improved consolidation in the left lower lobe. Unchanged mild airspace disease at the right lung base. Unchanged small left pleural effusion. The lungs remain hyperinflated with emphysematous changes. No pneumothorax. No acute osseous abnormality. IMPRESSION: 1. Multifocal pneumonia, slightly improved in the left lower lobe. 2. Unchanged small left pleural effusion. 3. COPD. Electronically Signed   By: Titus Dubin M.D.   On: 07/14/2019 07:36        Scheduled Meds: . albuterol  2.5 mg Nebulization TID  . chlorhexidine  15 mL Mouth Rinse BID  . Chlorhexidine Gluconate Cloth  6 each Topical Daily  . glycopyrrolate  0.1 mg Intravenous BID  . mouth  rinse  15 mL Mouth Rinse BID  . mouth rinse  15 mL Mouth Rinse q12n4p  . thiamine  100 mg Intravenous Daily   Continuous Infusions: . sodium chloride 30  mL/hr at 07/15/19 0500  . dextrose 75 mL/hr at 07/15/19 0500  . magnesium sulfate bolus IVPB    . potassium chloride       LOS: 8 days   The patient is critically ill with multiple organ systems failure and requires high complexity decision making for assessment and support, frequent evaluation and titration of therapies, application of advanced monitoring technologies and extensive interpretation of multiple databases. Critical Care Time devoted to patient care services described in this note  Time spent: 40 minutes     Keyli Duross, Geraldo Docker, MD [facility] Pager [phone]  If 7PM-7AM, please contact night-coverage [email or web address] Password [facility] 07/15/2019, 8:43 AM

## 2019-07-15 NOTE — Telephone Encounter (Signed)
Scheduled patient with MR on 6/11 in [place] since the patient has a [place] address.   Order for CT has been placed.   Will close encounter.

## 2019-07-15 NOTE — Telephone Encounter (Signed)
CT chest without contrast to be done just prior to followup - to be done as an outpatient

## 2019-07-15 NOTE — Progress Notes (Signed)
  Speech Language Pathology Treatment: Dysphagia  Patient Details Name: Devon Peterson MRN: [id number] DOB: Jan 09, 1956 Today's Date: 07/15/2019 Time: 1771-1657 SLP Time Calculation (min) (ACUTE ONLY): 15 min  Assessment / Plan / Recommendation Clinical Impression  Pt seen at bedside for dysphagia treatment. Oral care was completed with suction. Oral cavity noted to be moist, pink, and healthy. Following oral care, pt accepted trials of individual ice chips. Audible swallow elicited, as well as congested productive cough after the swallow. Pt fatigues quickly, and declined additional presentations of ice chips after 3 were given. Strong cough productive of thin bloody secretions. SLP reviewed pharyngeal strengthening exercises with pt, and encouraged him to practice them several times throughout the day. This information is posted in pt room where he can see it.   SLP will continue to follow for dysphagia treatment, and to assess appropriateness for PMSV trials.    HPI HPI: 64 yo with progressive dysphagia, appears cachetic, found to have likely aspiration pna per CT - enhancing mass LLL- suspected aspiration.  Imaging studies also concerning for soft tissue mass in hypopharynx. Pt has been a consumer of ETOH - beer and smokes cigarettes. CT neck showed "Enhancing hypopharyngeal soft tissue in the post cricoid region extending superiorly with effacement of the pyriform sinuses and thickening of the aryepiglottic folds."  Pt desired to eat/drink and swallow evaluation had been ordered. SLP advised an MBS to view impact of mass on pharyngeal swallow. This was completed 07/08/10, prior to trach placement (06/08/19), with recommendation for NPO except ice chips after oral care.      SLP Plan  Continue with current plan of care  Patient needs continued Speech Language Pathology Services    Recommendations  Diet recommendations: NPO(ice chips after oral care)                Oral Care Recommendations:  Oral care QID Follow up Recommendations: Other (comment)(TBD) SLP Visit Diagnosis: Dysphagia, oropharyngeal phase (R13.12);Dysphagia, pharyngoesophageal phase (R13.14) Plan: Continue with current plan of care       [place]. Quentin Ore, [facility], [place] Speech Language Pathologist Office: [phone] Pager: [phone]  Shonna Chock 07/15/2019, 2:08 PM

## 2019-07-15 NOTE — Evaluation (Signed)
[facility] Speaking Valve - Evaluation Patient Details  Name: Devon Peterson MRN: [id number] Date of Birth: Jul 21, 1955  Today's Date: 07/15/2019 Time: 1300-1330 SLP Time Calculation (min) (ACUTE ONLY): 30 min  Past Medical History:  Past Medical History:  Diagnosis Date  . COPD (chronic obstructive pulmonary disease) ([place])   . Medical history non-contributory    Past Surgical History:  Past Surgical History:  Procedure Laterality Date  . DIRECT LARYNGOSCOPY N/A 07/09/2019   Procedure: DIRECT LARYNGOSCOPY WITH BIOPSY;  Surgeon: Jerrell Belfast, MD;  Location: [facility];  Service: ENT;  Laterality: N/A;  . IR FLUORO RM 30-60 MIN  07/13/2019  . IR GASTROSTOMY TUBE MOD SED  07/14/2019  . NO PAST SURGERIES    . TRACHEOSTOMY TUBE PLACEMENT N/A 07/09/2019   Procedure: TRACHEOSTOMY;  Surgeon: Jerrell Belfast, MD;  Location: [facility];  Service: ENT;  Laterality: N/A;   HPI:  64 yo with progressive dysphagia, appears cachetic, found to have likely aspiration pna per CT - enhancing mass LLL- suspected aspiration.  . Imaging studies also concerning for soft tissue mass in hypopharynx. Pt has been a consumer of ETOH - beer and smokes cigarettes.   CT neck showed "Enhancing hypopharyngeal soft tissue in the post cricoid region extending superiorly with effacement of the piriform sinuses and thickening of the aryepiglottic folds."  Pt desired to eat/drink and swallow evaluation had been ordered. SLP advised an MBS to view impact of mass on pharyngeal swallow.   Assessment / Plan / Recommendation Clinical Impression  Pt was awake and willing to participate in evaluation for PMSV readiness. Cuff was mostly deflated upon arrival of SLP. RN present as SLP insured cuff was completely deflated. RN suctioned pt, which resulted in removal of thin bloody secretions via suction and with strong cough. Pt was able to achieve gravelly voice quality with finger occlusion, but continued to exhibit productive cough of signicant  secretions. PMSV was not placed at this time for this reason. SLP will continue to follow pt for readiness to attempt speaking valve placement. Cuff was left deflated when SLP left the room.   SLP Visit Diagnosis: Aphonia (R49.1)    SLP Assessment  Patient needs continued Speech Language Pathology Services    Follow Up Recommendations  Other (comment)(TBD)    Frequency and Duration min 2x/week  2 weeks    PMSV Trial Able to redirect subglottic air through upper airway: Yes Able to Attain Phonation: Yes Voice Quality: (gravelly)   Tracheostomy Tube       Vent Dependency  FiO2 (%): 28 %    Cuff Deflation Trial  GO   Saleha Kalp B. Quentin Ore, MSP, CCC-SLP Speech Language Pathologist Office: [phone] Pager: [phone]  Tolerated Cuff Deflation: Yes Length of Time for Cuff Deflation Trial: cuff mostly deflated upon arrival of SLP Behavior: Alert;Expresses self well;Controlled        Shonna Chock 07/15/2019, 1:51 PM

## 2019-07-15 NOTE — Progress Notes (Signed)
[name], male   DOB: [birth year], 64 y.o.   MRN: [id number]    Referring Physician(s): Sheikh,O  Supervising Physician: Markus Daft  Patient Status:  [name] - In-pt  Chief Complaint:  Dysphagia, malnutrition  Subjective: Pt resting quietly; no acute changes; has some soreness at G tube site as expected   Allergies: Patient has no known allergies.  Medications: Prior to Admission medications   Medication Sig Start Date End Date Taking? Authorizing Provider  azithromycin (ZITHROMAX) 250 MG tablet Take 1 tablet (250 mg total) by mouth daily. Patient not taking: Reported on 07/07/2019 04/19/19   Edwin Dada, MD  cefdinir (OMNICEF) 300 MG capsule Take 1 capsule (300 mg total) by mouth 2 (two) times daily. Patient not taking: Reported on 07/07/2019 04/19/19   Edwin Dada, MD     Vital Signs: BP (!) 143/83 (BP Location: Left Arm)   Pulse 69   Temp 97.6 F (36.4 C) (Oral)   Resp 18   Ht 5\' 11"  (1.803 m)   Wt 92 lb 9.5 oz (42 kg)   SpO2 99%   BMI 12.91 kg/m   Physical Exam awake, answers questions okay, G-tube intact, insertion site mildly tender,  some old blood around insertion site.  Imaging: IR GASTROSTOMY TUBE MOD SED  Result Date: 07/14/2019 INDICATION: History of lymphoma and neck mass with tracheostomy. Please perform gastrostomy tube placement for enteric nutrition supplementation purposes. EXAM: PUSH GASTROSTOMY TUBE PLACEMENT COMPARISON:  CT abdomen pelvis-07/08/2019 MEDICATIONS: Patient is currently admitted to the hospital receiving intravenous antibiotics; Antibiotics were administered within 1 hour of the procedure. CONTRAST:  15 mL of Isovue 300 administered into the gastric lumen. ANESTHESIA/SEDATION: Moderate (conscious) sedation was employed during this procedure. A total of Versed 1 mg and Fentanyl 25 mcg was administered intravenously. Moderate Sedation Time: 12 minutes. The patient's level of consciousness and vital signs were  monitored continuously by radiology nursing throughout the procedure under my direct supervision. FLUOROSCOPY TIME:  1 minute, 12 seconds (6 mGy) COMPLICATIONS: None immediate. PROCEDURE: Informed written consent was obtained from the patient following explanation of the procedure, risks, benefits and alternatives. A time out was performed prior to the initiation of the procedure. Ultrasound scanning was performed to demarcate the edge of the left lobe of the liver. Maximal barrier sterile technique utilized including caps, mask, sterile gowns, sterile gloves, large sterile drape, hand hygiene and Betadine prep. The left upper quadrant was sterilely prepped and draped. A oral gastric catheter was inserted into the stomach under fluoroscopy. The existing nasogastric feeding tube was removed. The left costal margin was marked. Air was injected into the stomach for insufflation and visualization under fluoroscopy. Under sterile conditions and local anesthesia, 3 T tacks were utilized to pexy the anterior aspect of the stomach against the ventral abdominal wall. Contrast injection confirmed appropriate positioning of each of the T tacks. An incision was made between the T tacks and a 17 gauge trocar needle was utilized to access the stomach. Needle position was confirmed within the stomach with aspiration of air and injection of a small amount of contrast. Next, over an Amplatz wire, track was serially dilated ultimately allowing placement a peel-away sheath and an 18-French balloon retention gastrostomy tube. The retention balloon was insufflated with a mixture of dilute saline and contrast and pulled taut against the anterior wall of the stomach. The external disc was cinched. Contrast injection confirms positioning within the stomach. Several spot radiographic images were obtained in various obliquities for documentation.  The patient tolerated procedure well without immediate post procedural complication. FINDINGS:  After successful fluoroscopic guided placement, the gastrostomy tube is appropriately positioned with internal retention balloon against the ventral aspect of the gastric lumen. IMPRESSION: Successful fluoroscopic insertion of an 23 French balloon retention gastrostomy tube. The gastrostomy may be used immediately for medication administration and in 24 hrs for the initiation of feeds. Electronically Signed   By: Sandi Mariscal M.D.   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 Scheduled for percutaneous gastrostomy tube placement. EXAM: IR FLUORO RM 0-60 MIN ANESTHESIA/SEDATION: Versed 1.0 mg, fentanyl 25 mcg. A radiology nurse monitored the patient for moderate sedation. MEDICATIONS: Glucagon 0.5 mg CONTRAST:  None PROCEDURE: Informed consent was obtained for percutaneous gastrostomy tube. The patient was placed supine on the interventional table. A 5 French orogastric tube was placed with fluoroscopy. Catheter was advanced into the stomach. Anterior abdomen was prepped and draped in sterile fashion. Maximal barrier sterile technique was utilized including caps, mask, sterile gowns, sterile gloves, sterile drape, hand hygiene and skin antiseptic. Stomach was insufflated with air. Skin was anesthetized using 1% lidocaine. At this point in the procedure, there was a power outage and we were unable to regain power with fluoroscopic table and machine. As a result, the gastrostomy tube was aborted. COMPLICATIONS: None immediate FINDINGS: Orogastric tube placed in the stomach. Adequate percutaneous window was identified with fluoroscopy. IMPRESSION: Aborted gastrostomy tube placement due to loss of power within the interventional radiology suite. Patient will be rescheduled for gastrostomy tube placement. Electronically Signed   By: Markus Daft M.D.   On: 07/14/2019 10:15   DG CHEST PORT 1 VIEW  Result Date:  07/14/2019 CLINICAL DATA:  Shortness of breath. EXAM: PORTABLE CHEST 1 VIEW COMPARISON:  Chest x-ray from yesterday. FINDINGS: Unchanged tracheostomy tube. The heart size and mediastinal contours are within normal limits. Normal pulmonary vascularity. Slightly improved consolidation in the left lower lobe. Unchanged mild airspace disease at the right lung base. Unchanged small left pleural effusion. The lungs remain hyperinflated with emphysematous changes. No pneumothorax. No acute osseous abnormality. IMPRESSION: 1. Multifocal pneumonia, slightly improved in the left lower lobe. 2. Unchanged small left pleural effusion. 3. COPD. Electronically Signed   By: Titus Dubin M.D.   On: 07/14/2019 07:36   DG CHEST PORT 1 VIEW  Result Date: 07/13/2019 CLINICAL DATA:  Shortness of breath EXAM: PORTABLE CHEST 1 VIEW COMPARISON:  Jul 11, 2019 FINDINGS: Tracheostomy catheter tip is 5.6 cm above the carina. No pneumothorax. Airspace consolidation throughout the left mid and lower lung zones with associated left pleural effusion noted. There is a small right pleural effusion. Right lung otherwise clear. Heart size and pulmonary vascularity are normal. No adenopathy no bone lesions. IMPRESSION: Airspace consolidation in the mid and lower lung zones on the left with left pleural effusion persists. Small right pleural effusion. No new opacity appreciable. Stable cardiac silhouette. No pneumothorax. Electronically Signed   By: Lowella Grip III M.D.   On: 07/13/2019 08:00    Labs:  CBC: Recent Labs    07/12/19 0330 07/13/19 0412 07/14/19 0240 07/15/19 0352  WBC 5.9 5.7 4.3 4.7  HGB 10.0* 10.0* 9.2* 9.5*  HCT 29.5* 29.5* 27.0* 28.4*  PLT 208 212 227 281    COAGS: Recent Labs    07/07/19 1650 07/13/19 0455  INR 1.0 1.1  APTT 27  --  BMP: Recent Labs    07/12/19 0330 07/13/19 0412 07/14/19 0240 07/15/19 0352  NA 130* 132* 133* 132*  K 3.1* 3.5 3.4* 3.2*  CL 95* 95* 98 98  CO2 26 29 26  27   GLUCOSE 98 84 106* 128*  BUN <5* <5* <5* <5*  CALCIUM 8.1* 8.1* 7.9* 8.2*  CREATININE 0.40* 0.45* 0.46* 0.47*  GFRNONAA >60 >60 >60 >60  GFRAA >60 >60 >60 >60    LIVER FUNCTION TESTS: Recent Labs    07/12/19 0330 07/13/19 0412 07/14/19 0240 07/15/19 0352  BILITOT 0.7 0.6 0.4 0.3  AST 16 14* 12* 15  ALT 8 9 9 9   ALKPHOS 50 52 51 47  PROT 5.3* 5.5* 5.2* 5.5*  ALBUMIN 2.2* 2.2* 2.0* 2.2*    Assessment and Plan: Patient with history of polysubstance abuse, COPD, recurrent aspiration pneumonia, dysphagia/protein calorie malnutrition, laryngeal carcinoma, left lung mass; status post placement of 18 French balloon retention gastrostomy tube on 5/11; afebrile, WBC normal, hemoglobin stable, creatinine 0.47; okay to use tube as needed for meds/feeds.   Electronically Signed: D. Rowe Robert, PA-C 07/15/2019, 10:14 AM   I spent a total of 15 minutes at the the patient's bedside AND on the patient's hospital floor or unit, greater than 50% of which was counseling/coordinating care for percutaneous gastrostomy tube

## 2019-07-15 NOTE — TOC Progression Note (Signed)
Transition of Care [facility]) - Progression Note    Patient Details  Name: Devon Peterson MRN: [id number] Date of Birth: August 16, 1955  [facility] CM/SW Contact  Leeroy Cha, RN Phone Number: 07/15/2019, 8:38 AM  Clinical Narrative:    snf bed search boardened to [place],remains on trach collar at 28%, D10, Mags04, Kcl 53meq. Peg tube inserted on [id number].   Expected Discharge Plan: [place] Barriers to Discharge: Continued Medical Work up  Expected Discharge Plan and Services Expected Discharge Plan: [place] Acute Care Choice: [place]                                         Social Determinants of Health (SDOH) Interventions    Readmission Risk Interventions No flowsheet data found.

## 2019-07-16 ENCOUNTER — Telehealth: Payer: Self-pay | Admitting: Internal Medicine

## 2019-07-16 LAB — CBC WITH DIFFERENTIAL/PLATELET
Abs Immature Granulocytes: 0.02 10*3/uL (ref 0.00–0.07)
Basophils Absolute: 0 10*3/uL (ref 0.0–0.1)
Basophils Relative: 0 %
Eosinophils Absolute: 0 10*3/uL (ref 0.0–0.5)
Eosinophils Relative: 1 %
HCT: 27.7 % — ABNORMAL LOW (ref 39.0–52.0)
Hemoglobin: 9.7 g/dL — ABNORMAL LOW (ref 13.0–17.0)
Immature Granulocytes: 0 %
Lymphocytes Relative: 31 %
Lymphs Abs: 1.5 10*3/uL (ref 0.7–4.0)
MCH: 28.8 pg (ref 26.0–34.0)
MCHC: 35 g/dL (ref 30.0–36.0)
MCV: 82.2 fL (ref 80.0–100.0)
Monocytes Absolute: 0.4 10*3/uL (ref 0.1–1.0)
Monocytes Relative: 8 %
Neutro Abs: 2.8 10*3/uL (ref 1.7–7.7)
Neutrophils Relative %: 60 %
Platelets: 297 10*3/uL (ref 150–400)
RBC: 3.37 MIL/uL — ABNORMAL LOW (ref 4.22–5.81)
RDW: 12.2 % (ref 11.5–15.5)
WBC: 4.7 10*3/uL (ref 4.0–10.5)
nRBC: 0.4 % — ABNORMAL HIGH (ref 0.0–0.2)

## 2019-07-16 LAB — COMPREHENSIVE METABOLIC PANEL
ALT: 9 U/L (ref 0–44)
AST: 16 U/L (ref 15–41)
Albumin: 2.1 g/dL — ABNORMAL LOW (ref 3.5–5.0)
Alkaline Phosphatase: 54 U/L (ref 38–126)
Anion gap: 7 (ref 5–15)
BUN: <5 mg/dL — ABNORMAL LOW (ref 8–23)
CO2: 23 mmol/L (ref 22–32)
Calcium: 7.8 mg/dL — ABNORMAL LOW (ref 8.9–10.3)
Chloride: 91 mmol/L — ABNORMAL LOW (ref 98–111)
Creatinine, Ser: 0.45 mg/dL — ABNORMAL LOW (ref 0.61–1.24)
GFR calc Af Amer: >60 mL/min (ref >60)
GFR calc non Af Amer: >60 mL/min (ref >60)
Glucose, Bld: 134 mg/dL — ABNORMAL HIGH (ref 70–99)
Potassium: 3.7 mmol/L (ref 3.5–5.1)
Sodium: 121 mmol/L — ABNORMAL LOW (ref 135–145)
Total Bilirubin: 0.6 mg/dL (ref 0.3–1.2)
Total Protein: 5.5 g/dL — ABNORMAL LOW (ref 6.5–8.1)

## 2019-07-16 LAB — GLUCOSE, CAPILLARY
Glucose-Capillary: 105 mg/dL — ABNORMAL HIGH (ref 70–99)
Glucose-Capillary: 114 mg/dL — ABNORMAL HIGH (ref 70–99)
Glucose-Capillary: 137 mg/dL — ABNORMAL HIGH (ref 70–99)
Glucose-Capillary: 143 mg/dL — ABNORMAL HIGH (ref 70–99)
Glucose-Capillary: 145 mg/dL — ABNORMAL HIGH (ref 70–99)
Glucose-Capillary: 156 mg/dL — ABNORMAL HIGH (ref 70–99)
Glucose-Capillary: 98 mg/dL (ref 70–99)

## 2019-07-16 LAB — MAGNESIUM: Magnesium: 1.5 mg/dL — ABNORMAL LOW (ref 1.7–2.4)

## 2019-07-16 LAB — PHOSPHORUS: Phosphorus: 2.3 mg/dL — ABNORMAL LOW (ref 2.5–4.6)

## 2019-07-16 MED ORDER — MAGNESIUM SULFATE 4 GM/100ML IV SOLN
4.0000 g | Freq: Once | INTRAVENOUS | Status: AC
  Administered 2019-07-16: 4 g via INTRAVENOUS
  Filled 2019-07-16: qty 100

## 2019-07-16 MED ORDER — SODIUM CHLORIDE 3 % IN NEBU
4.0000 mL | INHALATION_SOLUTION | Freq: Every day | RESPIRATORY_TRACT | Status: AC
  Administered 2019-07-16 – 2019-07-18 (×3): 4 mL via RESPIRATORY_TRACT
  Filled 2019-07-16 (×3): qty 4

## 2019-07-16 MED ORDER — SODIUM PHOSPHATES 45 MMOLE/15ML IV SOLN
20.0000 mmol | Freq: Once | INTRAVENOUS | Status: AC
  Administered 2019-07-16: 20 mmol via INTRAVENOUS
  Filled 2019-07-16: qty 6.67

## 2019-07-16 NOTE — Telephone Encounter (Signed)
Patient was seen by oncologist Dr Benay Spice today at bed 1230 at [facility] long. Dr Benay Spice and I discussed   - plan   - cancel all followup with pulmonary   - Dr Benay Spice will folowup the lung lesion with CT timing etc., -> if he needs bx of lung Dr Benay Spice wll contact Dr Candida Peeling

## 2019-07-16 NOTE — Progress Notes (Signed)
Oncology Nurse Navigator Documentation  Received request from Devon Dilling NP via Dr. Benay Peterson to request that PD-L1 be added to patients biopsy on 07/09/19.  I called and spoke to Devon Peterson in pathology and she requested that I email her the request with patient name, Accession number, and doctor requesting add on, which I have done.   Harlow Asa RN, BSN, OCN Head & Neck Oncology Nurse [place] at [facility] Phone # [phone]  Fax # [phone]

## 2019-07-16 NOTE — Progress Notes (Addendum)
HEMATOLOGY-ONCOLOGY PROGRESS NOTE  SUBJECTIVE: The patient is awake and alert.  Watching television.  Denies pain at his tracheostomy site.  Has some mild discomfort at the PEG tube.  Currently receiving feedings at 30 cc/h.  No reported abdominal pain, nausea, vomiting.  PHYSICAL EXAMINATION:  Vitals:   07/16/19 0800 07/16/19 0824  BP: 135/87   Pulse: 86 77  Resp: 18 20  Temp: 97.9 F (36.6 C)   SpO2: 94% 96%   Filed Weights   07/07/19 2049 07/09/19 0949  Weight: 42 kg 42 kg    Intake/Output from previous day: 05/12 0701 - 05/13 0700 In: 3408.3 [I.V.:2392.9; NG/GT:245.7; IV Piggyback:769.7] Out: 575 [Urine:575]  General:  Severe cachexia, temporal muscle wasting noted.      Neck: tracheostomy midline with thick secretions and blood oozing from his tracheostomy site Respiratory: Diminished breath sounds.   Cardiovascular: Regular rate and rhythm, no lower extremity edema GI:  abdomen was soft, flat, nontender, nondistended, without organomegaly.    PEG tube without erythema or drainage.  Skin exam was without echymosis, petichae.   Neuro:  Awake and alert.    LABORATORY DATA:  I have reviewed the data as listed CMP Latest Ref Rng & Units 07/16/2019 07/15/2019 07/14/2019  Glucose 70 - 99 mg/dL 134(H) 128(H) 106(H)  BUN 8 - 23 mg/dL <5(L) <5(L) <5(L)  Creatinine 0.61 - 1.24 mg/dL 0.45(L) 0.47(L) 0.46(L)  Sodium 135 - 145 mmol/L 121(L) 132(L) 133(L)  Potassium 3.5 - 5.1 mmol/L 3.7 3.2(L) 3.4(L)  Chloride 98 - 111 mmol/L 91(L) 98 98  CO2 22 - 32 mmol/L _0 Calcium 8.9 - 10.3 mg/dL 7.8(L) 8.2(L) 7.9(L)  Total Protein 6.5 - 8.1 g/dL 5.5(L) 5.5(L) 5.2(L)  Total Bilirubin 0.3 - 1.2 mg/dL 0.6 0.3 0.4  Alkaline Phos 38 - 126 U/L 54 47 51  AST 15 - 41 U/L 16 15 12(L)  ALT 0 - 44 U/L _1 Lab Results  Component Value Date   WBC 4.7 07/16/2019   HGB 9.7 (L) 07/16/2019   HCT 27.7 (L) 07/16/2019   MCV 82.2 07/16/2019   PLT 297 07/16/2019   NEUTROABS 2.8 07/16/2019     CT Soft Tissue Neck W Contrast  Result Date: 07/08/2019 CLINICAL DATA:  Neck mass EXAM: CT NECK WITH CONTRAST TECHNIQUE: Multidetector CT imaging of the neck was performed using the standard protocol following the bolus administration of intravenous contrast. CONTRAST:  35m OMNIPAQUE IOHEXOL 300 MG/ML  SOLN COMPARISON:  None. FINDINGS: Evaluation is limited by poor soft tissue resolution in the setting significant cachexia and probable diffuse edema. Additionally, motion artifact is present. Pharynx and larynx: There is enhancing soft tissue in the post cricoid region extending superiorly with effacement of the piriform sinuses and thickening of the aryepiglottic folds. Salivary glands: Unremarkable. Thyroid: Unremarkable. Lymph nodes: No definite enlarged lymph nodes. Vascular: Major neck vessels are patent. There is calcified plaque at the right greater than left ICA origins. There may be significant stenosis on the right. Limited intracranial: No abnormal enhancement. Visualized orbits: Unremarkable. Mastoids and visualized paranasal sinuses: Bilateral maxillary sinus air-fluid levels. Visualized mastoid air cells are clear. Skeleton: Multilevel degenerative changes of the cervical spine. Upper chest: Emphysema. New extension of centrilobular/tree-in-bud opacities within the left upper lobe. Probable small left pleural effusion Other: None. IMPRESSION: Suboptimal evaluation due to motion artifact and poor soft tissue resolution in the setting of significant cachexia and probable diffuse edema. Enhancing hypopharyngeal soft tissue in the post cricoid region  extending superiorly with effacement of the piriform sinuses and thickening of the aryepiglottic folds. Malignancy is suspected and direct visual inspection is recommended. Extension of pneumonia into the left upper lobe. Probable small left pleural effusion. Maxillary sinus air-fluid levels, a nonspecific finding that can reflect acute sinusitis in the  appropriate setting. Electronically Signed   By: Macy Mis M.D.   On: 07/08/2019 13:45   CT Angio Chest PE W and/or Wo Contrast  Result Date: 07/07/2019 CLINICAL DATA:  Failure to thrive, history of pneumonia, cough EXAM: CT ANGIOGRAPHY CHEST WITH CONTRAST TECHNIQUE: Multidetector CT imaging of the chest was performed using the standard protocol during bolus administration of intravenous contrast. Multiplanar CT image reconstructions and MIPs were obtained to evaluate the vascular anatomy. CONTRAST:  14m OMNIPAQUE IOHEXOL 350 MG/ML SOLN COMPARISON:  07/07/2019 FINDINGS: Cardiovascular: This is a technically adequate evaluation of the pulmonary vasculature. No filling defects or pulmonary emboli. The heart is unremarkable without pericardial effusion. Mild calcification of the mitral and aortic valves. Mild atherosclerosis of the coronary vasculature. Mediastinum/Nodes: No enlarged mediastinal, hilar, or axillary lymph nodes. Thyroid gland, trachea, and esophagus demonstrate no significant findings. Because of marked thoracic kyphosis and positioning, a significant portion of the hypopharynx is included on this exam. There is asymmetric soft tissue in the supraglottic region, incompletely evaluated on this study. I am suspicious of a mass in the right hypopharynx, reference image 15. Follow-up CT neck with contrast or direct visual inspection is recommended. Lungs/Pleura: There is severe background emphysema. Bilateral lower lobe consolidation is seen, left greater than right. Given the above findings in the hypopharynx, aspiration should be considered in addition to community acquired pneumonia. Within the densely consolidated left lower lobe there is evidence of an enhancing 2.7 cm mass, which could reflect metastatic disease. This is best seen on image 126 of series 4. No effusion or pneumothorax. Upper Abdomen: Patient is markedly cachectic. No gross abnormalities are visualized. Musculoskeletal: No  acute or destructive bony lesions. Reconstructed images demonstrate no additional findings. Review of the MIP images confirms the above findings. IMPRESSION: 1. No evidence of pulmonary embolus. 2. Abnormal soft tissue in the hypopharynx, suspicious for underlying mass. CT neck or direct visual inspection recommended for further evaluation. 3. Dense bilateral lower lobe consolidation, left greater than right. Aspiration is suspected given findings in the hypopharynx. 4. There is evidence of an enhancing mass within the densely consolidated left lower lobe, metastatic disease or primary neoplasm not excluded. 5. Marked cachexia. Electronically Signed   By: MRanda NgoM.D.   On: 07/07/2019 19:12   CT ABDOMEN PELVIS W CONTRAST  Result Date: 07/08/2019 CLINICAL DATA:  Lymphoma staging. EXAM: CT ABDOMEN AND PELVIS WITH CONTRAST TECHNIQUE: Multidetector CT imaging of the abdomen and pelvis was performed using the standard protocol following bolus administration of intravenous contrast. CONTRAST:  856mOMNIPAQUE IOHEXOL 300 MG/ML  SOLN COMPARISON:  CT chest 07/07/2019 FINDINGS: Lower chest: Lung bases demonstrate interval worsening bibasilar consolidation left much worse than right. Rim enhancing low-density structure within the dense consolidation in the left base without significant change which could represent underlying malignancy versus necrotic infection or pulmonary abscess. Calcification of the mitral valve annulus. Hepatobiliary: Liver, gallbladder and biliary tree are normal. Pancreas: Normal. Spleen: Normal. Adrenals/Urinary Tract: Adrenal glands are unremarkable. Kidneys are normal in size and demonstrate bilateral streaky cortical low-attenuation which can be seen in pyelonephritis. No evidence of hydronephrosis. No definite Peri nephric inflammation or fluid. Ureters are not well visualized. Contrast is present within  the bladder from patient's recent chest CT. Stomach/Bowel: Stomach and small bowel  are unremarkable. Appendix is not well visualized. Colon is unremarkable. Moderate fecal retention over the rectum. Vascular/Lymphatic: Mild calcified plaque over the abdominal aorta which is normal caliber. No definite adenopathy. Reproductive: Normal. Other: Evidence of cachexia with significant decrease mesenteric fat and subcutaneous fat present. Musculoskeletal: Degenerative change of the spine and hips. IMPRESSION: 1. Interval worsening of bibasilar airspace consolidation left base worse than right likely due to pneumonia. Stable rim enhancing low-density focus over the left base consolidation which may be due to necrosis, pulmonary abscess or underlying malignancy. 2.  No evidence of metastatic disease within the abdomen. 3. Bilateral streaky cortical low-attenuation over the kidneys which can be seen in pyelonephritis. Recommend clinical correlation. 4.  Aortic Atherosclerosis (ICD10-I70.0). 5. Findings compatible patient's clinical cachexia with significant decreased subcutaneous and mesenteric fat. Electronically Signed   By: Marin Olp M.D.   On: 07/08/2019 13:03   IR GASTROSTOMY TUBE MOD SED  Result Date: 07/14/2019 INDICATION: History of lymphoma and neck mass with tracheostomy. Please perform gastrostomy tube placement for enteric nutrition supplementation purposes. EXAM: PUSH GASTROSTOMY TUBE PLACEMENT COMPARISON:  CT abdomen pelvis-07/08/2019 MEDICATIONS: Patient is currently admitted to the hospital receiving intravenous antibiotics; Antibiotics were administered within 1 hour of the procedure. CONTRAST:  15 mL of Isovue 300 administered into the gastric lumen. ANESTHESIA/SEDATION: Moderate (conscious) sedation was employed during this procedure. A total of Versed 1 mg and Fentanyl 25 mcg was administered intravenously. Moderate Sedation Time: 12 minutes. The patient's level of consciousness and vital signs were monitored continuously by radiology nursing throughout the procedure under my  direct supervision. FLUOROSCOPY TIME:  1 minute, 12 seconds (6 mGy) COMPLICATIONS: None immediate. PROCEDURE: Informed written consent was obtained from the patient following explanation of the procedure, risks, benefits and alternatives. A time out was performed prior to the initiation of the procedure. Ultrasound scanning was performed to demarcate the edge of the left lobe of the liver. Maximal barrier sterile technique utilized including caps, mask, sterile gowns, sterile gloves, large sterile drape, hand hygiene and Betadine prep. The left upper quadrant was sterilely prepped and draped. A oral gastric catheter was inserted into the stomach under fluoroscopy. The existing nasogastric feeding tube was removed. The left costal margin was marked. Air was injected into the stomach for insufflation and visualization under fluoroscopy. Under sterile conditions and local anesthesia, 3 T tacks were utilized to pexy the anterior aspect of the stomach against the ventral abdominal wall. Contrast injection confirmed appropriate positioning of each of the T tacks. An incision was made between the T tacks and a 17 gauge trocar needle was utilized to access the stomach. Needle position was confirmed within the stomach with aspiration of air and injection of a small amount of contrast. Next, over an Amplatz wire, track was serially dilated ultimately allowing placement a peel-away sheath and an 18-French balloon retention gastrostomy tube. The retention balloon was insufflated with a mixture of dilute saline and contrast and pulled taut against the anterior wall of the stomach. The external disc was cinched. Contrast injection confirms positioning within the stomach. Several spot radiographic images were obtained in various obliquities for documentation. The patient tolerated procedure well without immediate post procedural complication. FINDINGS: After successful fluoroscopic guided placement, the gastrostomy tube is  appropriately positioned with internal retention balloon against the ventral aspect of the gastric lumen. IMPRESSION: Successful fluoroscopic insertion of an 5 French balloon retention gastrostomy tube. The gastrostomy may be  used immediately for medication administration and in 24 hrs for the initiation of feeds. Electronically Signed   By: Sandi Mariscal M.D.   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 Scheduled for percutaneous gastrostomy tube placement. EXAM: IR FLUORO RM 0-60 MIN ANESTHESIA/SEDATION: Versed 1.0 mg, fentanyl 25 mcg. A radiology nurse monitored the patient for moderate sedation. MEDICATIONS: Glucagon 0.5 mg CONTRAST:  None PROCEDURE: Informed consent was obtained for percutaneous gastrostomy tube. The patient was placed supine on the interventional table. A 5 French orogastric tube was placed with fluoroscopy. Catheter was advanced into the stomach. Anterior abdomen was prepped and draped in sterile fashion. Maximal barrier sterile technique was utilized including caps, mask, sterile gowns, sterile gloves, sterile drape, hand hygiene and skin antiseptic. Stomach was insufflated with air. Skin was anesthetized using 1% lidocaine. At this point in the procedure, there was a power outage and we were unable to regain power with fluoroscopic table and machine. As a result, the gastrostomy tube was aborted. COMPLICATIONS: None immediate FINDINGS: Orogastric tube placed in the stomach. Adequate percutaneous window was identified with fluoroscopy. IMPRESSION: Aborted gastrostomy tube placement due to loss of power within the interventional radiology suite. Patient will be rescheduled for gastrostomy tube placement. Electronically Signed   By: Markus Daft M.D.   On: 07/14/2019 10:15   DG CHEST PORT 1 VIEW  Result Date: 07/14/2019 CLINICAL DATA:  Shortness of breath. EXAM: PORTABLE CHEST 1 VIEW  COMPARISON:  Chest x-ray from yesterday. FINDINGS: Unchanged tracheostomy tube. The heart size and mediastinal contours are within normal limits. Normal pulmonary vascularity. Slightly improved consolidation in the left lower lobe. Unchanged mild airspace disease at the right lung base. Unchanged small left pleural effusion. The lungs remain hyperinflated with emphysematous changes. No pneumothorax. No acute osseous abnormality. IMPRESSION: 1. Multifocal pneumonia, slightly improved in the left lower lobe. 2. Unchanged small left pleural effusion. 3. COPD. Electronically Signed   By: Titus Dubin M.D.   On: 07/14/2019 07:36   DG CHEST PORT 1 VIEW  Result Date: 07/13/2019 CLINICAL DATA:  Shortness of breath EXAM: PORTABLE CHEST 1 VIEW COMPARISON:  Jul 11, 2019 FINDINGS: Tracheostomy catheter tip is 5.6 cm above the carina. No pneumothorax. Airspace consolidation throughout the left mid and lower lung zones with associated left pleural effusion noted. There is a small right pleural effusion. Right lung otherwise clear. Heart size and pulmonary vascularity are normal. No adenopathy no bone lesions. IMPRESSION: Airspace consolidation in the mid and lower lung zones on the left with left pleural effusion persists. Small right pleural effusion. No new opacity appreciable. Stable cardiac silhouette. No pneumothorax. Electronically Signed   By: Lowella Grip III M.D.   On: 07/13/2019 08:00   DG CHEST PORT 1 VIEW  Result Date: 07/11/2019 CLINICAL DATA:  Short of breath EXAM: PORTABLE CHEST 1 VIEW COMPARISON:  CT 07/07/2019, radiograph 07/09/2019 FINDINGS: Tracheostomy tube unchanged.  Normal cardiac silhouette. There is bibasilar airspace disease. Airspace disease is dense at the LEFT lung base. Findings correspond to a dense lower lobe pneumonia seen on comparison CT. Upper lobes are clear. IMPRESSION: No change in dense LEFT lower lobe pneumonia and mild RIGHT lower lobe pneumonia. Electronically Signed   By:  Suzy Bouchard M.D.   On: 07/11/2019 08:01   DG CHEST PORT 1 VIEW  Result Date: 07/09/2019 CLINICAL DATA:  Short of breath. EXAM: PORTABLE CHEST 1 VIEW COMPARISON:  07/07/2019 FINDINGS: Normal heart size. Interval complete atelectasis of the left lower lobe with hyperexpansion of the left upper lobe. Progressive airspace disease within the left upper lobe. Right lung is clear. IMPRESSION: 1. Interval complete atelectasis of the left lower lobe which is concerning for either postobstructive changes due to left hilar adenopathy versus mucous plugging. 2. Progressive airspace disease within the hyperexpanded left lung. Electronically Signed   By: Kerby Moors M.D.   On: 07/09/2019 08:26   DG Chest Port 1 View  Result Date: 07/07/2019 CLINICAL DATA:  Cough. EXAM: PORTABLE CHEST 1 VIEW COMPARISON:  04/18/2019 FINDINGS: There is a new left lower lobe airspace opacity concerning for pneumonia. The heart size is stable. Aortic calcifications are noted. The lungs are hyperexpanded. There is no pneumothorax. There is no acute osseous abnormality. IMPRESSION: 1. New left lower lobe airspace opacity concerning for pneumonia. Follow-up to radiologic resolution is recommended. 2. COPD. Electronically Signed   By: Constance Holster M.D.   On: 07/07/2019 17:18   DG Swallowing Func-Speech Pathology  Result Date: 07/08/2019 Objective Swallowing Evaluation: Type of Study: MBS-Modified Barium Swallow Study  Patient Details Name: Devon Peterson MRN: [id number] Date of Birth: August 11, 1955 Today's Date: 07/08/2019 Time: SLP Start Time (ACUTE ONLY): 5852 -SLP Stop Time (ACUTE ONLY): 1320 SLP Time Calculation (min) (ACUTE ONLY): 25 min Past Medical History: Past Medical History: Diagnosis Date . Medical history non-contributory  Past Surgical History: Past Surgical History: Procedure Laterality Date . NO PAST SURGERIES   HPI: 64 yo with progressive dysphagia, appears cachetic, found to have likely aspiration pna per CT - enhancing mass  LLL- suspected aspiration.  . Imaging studies also concerning for soft tissue mass in hypopharynx. Pt has been a consumer of ETOH - beer and smokes cigarettes.   CT neck today showed "Enhancing hypopharyngeal soft tissue in the post cricoid region extending superiorly with effacement of the piriform sinuses andthickening of the aryepiglottic folds."  Pt desired to eat/drink and swallow evaluation had been ordered. SLP advised an MBS to view impact of mass on pharyngeal swallow.  Subjective: pt awake in chair Assessment / Plan / Recommendation CHL IP CLINICAL IMPRESSIONS 07/08/2019 Clinical Impression Pt presents with gross obstructive based dysphagia secondary to his "Enhancing hypopharyngeal soft tissue in the post cricoid region extending superiorly with effacement of the piriform sinuses and thickening of the aryepiglottic folds"per CT imaging.  Gross pharyngeal-cervical esophageal retention mixed with secretions was aspirated post=swallow as residuals spilled into an open airway. Pt consumption of small cup bolus resulted in improved muscular contraction and thus clearance into esophagus.  Recommend pt be npo except single ice chips after oral care.  Pt was informed to clinical reasoning for recommendations but immediately asked if he could eat - clearly demonstrating compromised understanding. SLP Visit Diagnosis Dysphagia, oropharyngeal phase (R13.12);Dysphagia, pharyngoesophageal phase (R13.14) Attention and concentration deficit following -- Frontal lobe and executive function deficit following -- Impact on safety and function Severe aspiration risk;Risk for inadequate nutrition/hydration   CHL IP TREATMENT RECOMMENDATION 07/08/2019 Treatment Recommendations Therapy as outlined in treatment plan below   Prognosis 07/08/2019 Prognosis for Safe Diet Advancement Guarded Barriers to Reach Goals -- Barriers/Prognosis Comment severity of dysphagia due to pt's mass CHL IP DIET RECOMMENDATION 07/08/2019 SLP Diet  Recommendations NPO;Ice chips PRN after oral care Liquid Administration via -- Medication Administration Via alternative means Compensations (No Data) Postural Changes --   CHL IP OTHER RECOMMENDATIONS 07/08/2019 Recommended Consults -- Oral Care Recommendations Oral care QID Other Recommendations --  CHL IP FOLLOW UP RECOMMENDATIONS 07/08/2019 Follow up Recommendations (No Data)   CHL IP FREQUENCY AND DURATION 07/08/2019 Speech Therapy Frequency (ACUTE ONLY) min 2x/week Treatment Duration 2 weeks      CHL IP ORAL PHASE 07/08/2019 Oral Phase Impaired Oral - Pudding Teaspoon -- Oral - Pudding Cup -- Oral - Honey Teaspoon -- Oral - Honey Cup -- Oral - Nectar Teaspoon Reduced posterior propulsion;Weak lingual manipulation;Delayed oral transit;Other (Comment) Oral - Nectar Cup Reduced posterior propulsion;Weak lingual manipulation;Delayed oral transit;Other (Comment);Lingual/palatal residue Oral - Nectar Straw -- Oral - Thin Teaspoon Weak lingual manipulation;Delayed oral transit;Other (Comment);Premature spillage Oral - Thin Cup Delayed oral transit;Weak lingual manipulation;Reduced posterior propulsion;Other (Comment);Lingual/palatal residue Oral - Thin Straw -- Oral - Puree -- Oral - Mech Soft -- Oral - Regular -- Oral - Multi-Consistency -- Oral - Pill -- Oral Phase - Comment lingual rocking observed across all boluses  CHL IP PHARYNGEAL PHASE 07/08/2019 Pharyngeal Phase Impaired Pharyngeal- Pudding Teaspoon -- Pharyngeal -- Pharyngeal- Pudding Cup -- Pharyngeal -- Pharyngeal- Honey Teaspoon -- Pharyngeal -- Pharyngeal- Honey Cup -- Pharyngeal -- Pharyngeal- Nectar Teaspoon Pharyngeal residue - pyriform;Pharyngeal residue - posterior pharnyx;Pharyngeal residue - cp segment;Penetration/Apiration after swallow;Moderate aspiration;Lateral channel residue;Inter-arytenoid space residue;Reduced airway/laryngeal closure;Reduced epiglottic inversion;Reduced anterior laryngeal mobility;Reduced laryngeal elevation Pharyngeal Material  enters airway, passes BELOW cords and not ejected out despite cough attempt by patient;Material enters airway, passes BELOW cords without attempt by patient to eject out (silent aspiration);Material enters airway, passes BELOW cords then ejected out Pharyngeal- Nectar Cup Penetration/Apiration after swallow;Pharyngeal residue - pyriform;Pharyngeal residue - posterior pharnyx;Pharyngeal residue - cp segment;Inter-arytenoid space residue;Lateral channel residue;Reduced airway/laryngeal closure;Reduced epiglottic inversion;Reduced laryngeal elevation;Reduced anterior laryngeal mobility Pharyngeal Material enters airway, passes BELOW cords without attempt by patient to eject out (silent aspiration) Pharyngeal- Nectar Straw -- Pharyngeal -- Pharyngeal- Thin Teaspoon Penetration/Apiration after swallow;Pharyngeal residue - pyriform;Pharyngeal residue - posterior pharnyx;Pharyngeal residue - cp segment;Inter-arytenoid space residue;Lateral channel residue;Reduced tongue base retraction;Reduced epiglottic inversion;Penetration/Aspiration before swallow;Penetration/Aspiration during swallow;Moderate aspiration;Reduced airway/laryngeal closure;Reduced laryngeal elevation;Reduced anterior laryngeal mobility Pharyngeal Material enters airway, passes BELOW cords without attempt by patient to eject out (silent aspiration);Material enters airway, passes BELOW cords and not ejected out despite cough attempt by patient Pharyngeal- Thin Cup Pharyngeal residue - pyriform;Pharyngeal residue - posterior pharnyx;Pharyngeal residue - valleculae;Pharyngeal residue - cp segment;Inter-arytenoid space residue;Lateral channel residue;Reduced airway/laryngeal closure;Reduced epiglottic inversion;Reduced laryngeal elevation;Reduced anterior laryngeal mobility Pharyngeal Material enters airway, passes BELOW cords without attempt by patient to eject out (silent aspiration);Material enters airway, passes BELOW cords and not ejected out despite  cough attempt by patient Pharyngeal- Thin Straw -- Pharyngeal -- Pharyngeal- Puree -- Pharyngeal -- Pharyngeal- Mechanical Soft -- Pharyngeal -- Pharyngeal- Regular -- Pharyngeal -- Pharyngeal- Multi-consistency -- Pharyngeal -- Pharyngeal- Pill -- Pharyngeal -- Pharyngeal Comment --  CHL IP CERVICAL ESOPHAGEAL PHASE 07/08/2019 Cervical Esophageal Phase Impaired Pudding Teaspoon -- Pudding Cup -- Honey Teaspoon -- Honey Cup -- Nectar Teaspoon -- Nectar Cup -- Nectar Straw -- Thin Teaspoon -- Thin Cup -- Thin Straw -- Puree -- Mechanical Soft -- Regular -- Multi-consistency -- Pill -- Cervical Esophageal Comment Pt with minimal clearance of barium into esophagus due to his obstructive mass which results in gross retention of barium mixed with secretions with aspiration post=swallow. Kathleen Lime, MS [facility] S[facility] Acute Rehab Services Office [phone] [name] 07/08/2019, 2:47 PM               ASSESSMENT AND PLAN: 1.  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  Left lower lobe of the lung mass concerning for primary neoplasm versus metastatic disease             -07/07/2019 CT angiogram of the chest 2.7 cm mass in the left lower lobe of the lung 3.  Cachexia 4.  Aspiration pneumonia 5.  Mild anemia 6.  Atrial fibrillation with RVR 6.  COPD 8.  Alcohol abuse 9.  Tobacco dependence 10.  Cocaine abuse  Devon Peterson is more alert this morning.  He appears stable.  He is not having any issues with his tracheostomy.  PEG tube has been placed and tube feedings have been started.  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  Has been evaluated by radiation oncology and awaiting final  recommendations.  Recommendations: 1.  We will send PD-L1 testing on pathology. 2.  Recommend a course of palliative or definitive radiation.  Radiation oncology consult has been completed.  Awaiting final recommendations. 3.  Will follow up on the lung mass and determine further work-up at a future date. 4.  Continue tube feedings per dietitian recommendations. 5.  Outpatient follow-up at the cancer center 6.  Management of hyponatremia per the medical service   LOS: 9 days   Mikey Bussing, [place], AGPCNP-BC, AOCNP 07/16/19 Devon Peterson appears unchanged.  He is alert and appears comfortable.  I discussed the case with pulmonary medicine.  We will plan for a repeat chest CT in 2-3 weeks to follow-up on the potential left lung mass.  We will arrange for a biopsy is indicated.  Devon Peterson appears to be a candidate for palliative or definitive radiation.  I do not recommend concurrent chemotherapy.  We will follow up on results of PD-L1 testing as he may be a candidate for immunotherapy in the future.  He should continue PT and optimize nutrition.  Outpatient follow-up will be scheduled at the Cancer center.  Please call oncology as needed.

## 2019-07-16 NOTE — Progress Notes (Signed)
PROGRESS NOTE    Devon Peterson  [id number] DOB: 08/21/1955 DOA: 07/07/2019 PCP: Patient, No Pcp Per   Brief Narrative:  Devon Peterson a 64 y.o.BM PMHx COPD,Polysubstance abuse (EtOH, tobacco, Cocaine), hospital admission 04/17/2019-04/19/2019 for sepsis secondary to pneumonia and COPD exacerbation  Presenting to the ED via EMS for evaluation of cough, shortness of breath, fatigue, and unintentional weight loss.History provided by patient and his nephew at bedside. Nephew states patient was admitted to the hospital a month ago for pneumonia and since then has continued to do poorly. He has difficulty swallowing food and has been spitting up saliva. He is coughing a lot. Patient denies fevers, chills, shortness of breath, chest pain, nausea, vomiting, abdominal pain, diarrhea, or dysuria. Nephew states patient has been smoking a pack of cigarettes daily since a very young age. He has lost a lot of weight recently. Addendum: 07/07/2019 8:40 PM. Patient also denied history of hematemesis, hematochezia, or melena.  ED Course:Tachycardic on arrival with heart rate in the 150s, appeared to be sinus rhythm. Afebrile. Slightly tachypneic. Labs showing no leukocytosis. Initial lactic acid 2.5, repeat after IV fluid pending. Hemoglobin 12.7, stable compared to labs done in February 2021. Platelet count normal. BUN 27, creatinine 0.8. Albumin 3.3. LFTs normal. Blood culture x2 pending. SARS-CoV-2 PCR test negative. Influenza panel negative. UA not suggestive of infection. Urine culture pending. Chest x-ray showing new left lower lobe airspace opacity concerning for pneumonia. CT angiogram chest negative for PE. Showing abnormal soft tissue in the hypopharynx suspicious for underlying mass. Dense bilateral lower lobe consolidation, left greater than right. Aspiration suspected given findings in the hypopharynx. Also showing evidence of an enhancing mass within the densely consolidated  left lower lobe, metastatic disease or primary neoplasm not excluded. Marked cachexia.  Patient received ceftriaxone, azithromycin, and 2 L normal saline boluses.  **Interim History He was evaluated by ENT who recommended a tracheostomy.  Tracheostomy was done on 07/10/19 and postoperatively he ended up on pressors which were weaned off.  He is also getting a tissue biopsy and medical oncology as well as radiation oncology were consulted for further evaluation.  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 Postoperatively he had hypotension and went in A. fib with RVR and started on Neo-Synephrine drip has been stopped.  Critical care was consulted for further evaluation and his hypotension is improved.  We will continue IV fluid but patient needs long-term nutrition needs and needs a PEG tube and have called IR but they will reevaluate Monday for a PEG tube given the national shortage of PEG tube.  On 07/11/19 Patient's tracheostomy was doing okay but he was having a lot of secretions but not as much.  Again radiation oncology wants to have improvement in the patient's performance status as well as nutritional status as well as further evaluation of the lung mass with likely pet imaging as an outpatient and potential biopsy of lung lesion to determine if this is metastatic versus a second primary.  This will help determine radiation oncology if treatment will be of curative or more of a palliative approach.  They will continue to collaborate with medical oncology  On 07/12/19 he continued to have significant amount of secretions however they are becoming thicker we will try Mucomyst as well as Robinul to thin the secretions.  He denies any current pain at this time but was very weak and is severely deconditioned so PT OT was reconsulted again.   Subjective: Alert, nods yes and no to questions,  do thumbs up and thumbs down to questions.  Negative CP, negative abdominal pain, negative S  OB.   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 Wilburn Cornelia ENT placed tracheostomy -5/13 oncology sent PD-L1 testing on pathology. -5/13  Recommend a course of palliative or definitive radiation.  Radiation oncology consult has been completed.  Awaiting final recommendations. -5/13 oncology Will follow up on the lung mass and determine further work-up at a future date. - Outpatient follow-up at the cancer center -Continued thick secretions requiring frequent suctioning. -5/14 reconsult ENT can we upsize trach? -Hypertonic saline nebulizer daily followed by albuterol nebulizer treatment - COPD -See aspiration pneumonia  Dysphagia -5/11 PEG tube placed -Osmolite 1.5 @ 20 ml/hr to advance by 10 ml every 12 hours to reach goal rate of 50 ml/hr -30 ml prostat once/day.  A. fib with RVR -Went into A. fib with RVR in the PACU and was given IV metoprolol -Heart rates have improved some but he is hypotensive and had to be placed on a Neo-Synephrine drip -We will not anticoagulate at this time but may need anticoagulation soon -5/12 currently NSR  Hypotension -Resolved  Severe protein calorie malnutrition/Underweight -See dysphagia   Generalized weakness/physical deconditioning: -PT and OT initial evaluation recommending Home Health PT but re-evaluation recommending SNF now given his condition  EtOH abuse -Folic acid 1 mg daily -Thiamine 100 mg daily -Currently no signs or symptoms of withdrawal.  Cocaine abuse -5/5 positive  cocaine -Avoid beta-blockers, although if absolutely required for control of HTN this far out would be safe  Tobacco abuse -NicoDerm patch -Counseled on need for absolute cessation  Hypoglycemia -In the setting of poor p.o. intake and was persistent -An outpatient on tube feeds should resolve -5/13 discontinue D10    Hypokalemia -Potassium goal> 4 -K-Phos 30 mmol -Potassium IV 30 mEq -Repeat BMP,Mg PO4@ 1600    Hypomagnesmia -Magnesium goal> 2 -Magnesium 4 g  Hypophosphatemia -See hyponatremia  Hyponatremia -Sodium phosphate IV 20 mmol -Normal saline 84m/hr  Normocytic Anemia -Patient's hemoglobin/hematocit is dropping slowly and went from 11.3/34.3 -> 10.0/29.5 -> 9.2/27.0 -Check anemia panel and showed an iron level of 30, U IBC 130, TIBC 150, saturation ratios of 19%, ferritin level 421, folate level 11.5, and vitamin B12 of 281 -Continue to monitor for signs and symptoms of bleeding; currently had a lot of bleeding from his trach site to be injected with lidocaine   Stage II sacral pressure ulcer (POA Pressure Injury 07/07/19 Sacrum Mid;Upper Stage 2 -  Partial thickness loss of dermis presenting as a shallow open injury with a red, pink wound bed without slough. 3 each small separate open areas (Active)  07/07/19 2107  Location: Sacrum  Location Orientation: Mid;Upper  Staging: Stage 2 -  Partial thickness loss of dermis presenting as a shallow open injury with a red, pink wound bed without slough.  Wound Description (Comments): 3 each small separate open areas  Present on Admission: Yes  -Continue with Mepilex pads and frequent turning    Goals of care 5/12 palliative care consult; patient  with poorly differentiated squamous carcinoma, extremely cachectic now trach and PEG dependent.  Evaluate for change of CODE STATUS, consider hospice --PT OT recommending SNF.  Will await palliative care recommendations   DVT prophylaxis: SCD Code Status:  Full Family Communication:  Disposition Plan:  Status is: Inpatient  Dispo: The patient is from: Home              Anticipated d/c is to: SNF?  Hospice?              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  VENTILATOR SETTINGS:    Cultures   Antimicrobials: Anti-infectives (From admission, onward)   Start     Dose/Rate Stop   07/07/19 2200  ampicillin-sulbactam (UNASYN) 1.5 g in sodium chloride 0.9 % 100 mL IVPB     1.5 g 200 mL/hr over 30 Minutes 07/14/19 1606   07/07/19 1930  metroNIDAZOLE (FLAGYL) IVPB 500 mg  Status:  Discontinued     500 mg 100 mL/hr over 60 Minutes 07/07/19 2003   07/07/19 1800  cefTRIAXone (ROCEPHIN) 1 g in sodium chloride 0.9 % 100 mL IVPB     1 g 200 mL/hr over 30 Minutes 07/07/19 1830   07/07/19 1800  azithromycin (ZITHROMAX) 500 mg in sodium chloride 0.9 % 250 mL IVPB     500 mg 250 mL/hr over 60 Minutes 07/07/19 1855       Devices    LINES / TUBES:      Continuous Infusions: . sodium chloride Stopped (07/15/19 1037)  . dextrose 75 mL/hr at 07/16/19 0844  . feeding supplement (OSMOLITE 1.5 CAL) 20 mL/hr at 07/16/19 0435     Objective: Vitals:   07/16/19 0428 07/16/19 0600 07/16/19 0800 07/16/19 0824  BP:  (!) 146/86 135/87   Pulse:  70 86 77   Resp:  (!) _0 Temp: 97.6 F (36.4 C)  97.9 F (36.6 C)   TempSrc: Axillary  Oral   SpO2:  96% 94% 96%  Weight:      Height:        Intake/Output Summary (Last 24 hours) at 07/16/2019 1123 Last data filed at 07/16/2019 3338 Gross per 24 hour  Intake 3408.33 ml  Output 825 ml  Net 2583.33 ml   Filed Weights   07/07/19 2049 07/09/19 0949  Weight: 42 kg 42 kg    Examination:  General: Alert, nods head yes and no to questions appropriately, positive acute respiratory distress, cachectic Eyes: negative scleral hemorrhage, negative anisocoria, negative icterus ENT: Negative Runny nose, negative gingival bleeding, Neck:  Negative scars, masses, torticollis, lymphadenopathy, JVD #4 cuffed trach in place negative sign of infection, thick secretions Lungs: Clear to auscultation bilaterally without wheezes or crackles Cardiovascular: Regular rate and rhythm without murmur gallop or rub normal S1 and  S2 Abdomen: negative abdominal pain, nondistended, positive soft, bowel sounds, no rebound, no ascites, no appreciable mass Extremities: No significant cyanosis, clubbing, or edema bilateral lower extremities Skin: Negative rashes, lesions, ulcers Psychiatric:  Negative depression, negative anxiety, negative fatigue, negative mania  Central nervous system:  Cranial nerves II through XII intact, tongue/uvula midline, all extremities muscle strength 5/5, sensation intact throughout,  .     Data Reviewed: Care during the described time interval was provided by me .  I have reviewed this patient's available data, including medical history, events of note, physical examination, and all test results as part of my evaluation.   CBC: Recent Labs  Lab 07/12/19 0330 07/13/19 0412 07/14/19 0240 07/15/19 0352 07/16/19 0437  WBC 5.9 5.7 4.3 4.7 4.7  NEUTROABS 4.1 4.1 2.6 3.1 2.8  HGB 10.0* 10.0* 9.2* 9.5* 9.7*  HCT 29.5* 29.5* 27.0* 28.4* 27.7*  MCV 81.9 83.3 83.3 84.5 82.2  PLT 208  212 227 281 355   Basic Metabolic Panel: Recent Labs  Lab 07/12/19 0330 07/13/19 0412 07/14/19 0240 07/15/19 0352 07/16/19 0437  NA 130* 132* 133* 132* 121*  K 3.1* 3.5 3.4* 3.2* 3.7  CL 95* 95* 98 98 91*  CO2 _0 GLUCOSE 98 84 106* 128* 134*  BUN <5* <5* <5* <5* <5*  CREATININE 0.40* 0.45* 0.46* 0.47* 0.45*  CALCIUM 8.1* 8.1* 7.9* 8.2* 7.8*  MG 1.2* 1.7 1.9 1.6* 1.5*  PHOS 2.4* 3.5 3.0 2.3* 2.3*   GFR: Estimated Creatinine Clearance: 55.4 mL/min (A) (by C-G formula based on SCr of 0.45 mg/dL (L)). Liver Function Tests: Recent Labs  Lab 07/12/19 0330 07/13/19 0412 07/14/19 0240 07/15/19 0352 07/16/19 0437  AST 16 14* 12* 15 16  ALT _1 ALKPHOS 50 52 51 47 54  BILITOT 0.7 0.6 0.4 0.3 0.6  PROT 5.3* 5.5* 5.2* 5.5* 5.5*  ALBUMIN 2.2* 2.2* 2.0* 2.2* 2.1*   No results for input(s): LIPASE, AMYLASE in the last 168 hours. No results for input(s): AMMONIA in the last 168 hours. Coagulation Profile: Recent Labs  Lab 07/13/19 0455  INR 1.1   Cardiac Enzymes: No results for input(s): CKTOTAL, CKMB, CKMBINDEX, TROPONINI in the last 168 hours. BNP (last 3 results) No results for input(s): PROBNP in the last 8760 hours. HbA1C: No results for input(s): HGBA1C in the last 72 hours. CBG: Recent Labs  Lab 07/15/19 1535 07/15/19 1932 07/15/19 2345 07/16/19 0426 07/16/19 0814  GLUCAP 125* 123* 114* 143* 156*   Lipid Profile: No results for input(s): CHOL, HDL, LDLCALC, TRIG, CHOLHDL, LDLDIRECT in the last 72 hours. Thyroid Function Tests: No results for input(s): TSH, T4TOTAL, FREET4, T3FREE, THYROIDAB in the last 72 hours. Anemia Panel: No results for input(s): VITAMINB12, FOLATE, FERRITIN, TIBC, IRON, RETICCTPCT in the last 72 hours. Urine analysis:    Component Value Date/Time   COLORURINE YELLOW 07/07/2019 1729   APPEARANCEUR HAZY (A) 07/07/2019 1729   LABSPEC 1.016 07/07/2019 1729   PHURINE 5.0 07/07/2019 1729   GLUCOSEU NEGATIVE  07/07/2019 1729   HGBUR NEGATIVE 07/07/2019 1729   BILIRUBINUR NEGATIVE 07/07/2019 1729   KETONESUR NEGATIVE 07/07/2019 1729   PROTEINUR 30 (A) 07/07/2019 1729   UROBILINOGEN 1.0 10/28/2008 0458   NITRITE NEGATIVE 07/07/2019 1729   LEUKOCYTESUR NEGATIVE 07/07/2019 1729   Sepsis Labs: _2 (procalcitonin:4,lacticidven:4)  ) Recent Results (from the past 240 hour(s))  Blood Culture (routine x 2)     Status: None   Collection Time: 07/07/19  4:45 PM   Specimen: BLOOD LEFT HAND  Result Value Ref Range Status   Specimen Description   Final    BLOOD LEFT HAND Performed at [facility] [address], [place], [place] [address]    Special Requests   Final    BOTTLES DRAWN AEROBIC ONLY Blood Culture results may not be optimal due to an inadequate volume of blood received in culture bottles Performed at [facility] [address], [place], [place] [address]    Culture   Final    NO GROWTH 5 DAYS Performed at [facility], [place] [address] [place], [place] [address]    Report Status 07/12/2019 FINAL  Final  Blood Culture (routine x 2)     Status: None   Collection Time: 07/07/19  4:46 PM   Specimen: BLOOD  Result Value Ref Range Status   Specimen Description   Final    BLOOD RIGHT ANTECUBITAL Performed at [facility] [address], [place], [place] [address]    Special Requests   Final    BOTTLES DRAWN AEROBIC AND ANAEROBIC Blood Culture results may not be optimal due to an inadequate volume of blood received in culture bottles Performed at [facility] [address], [place], [place] [address]    Culture   Final    NO GROWTH 5 DAYS Performed at [facility], [place] [address] [place], [place] [address]    Report Status 07/12/2019 FINAL  Final  Respiratory Panel by RT PCR (Flu A&B, Covid) - Nasopharyngeal Swab     Status: None   Collection Time: 07/07/19  5:14 PM   Specimen:  Nasopharyngeal Swab  Result Value Ref Range Status   SARS Coronavirus 2 by RT PCR NEGATIVE NEGATIVE Final    Comment: (NOTE) SARS-CoV-2 target nucleic acids are NOT DETECTED. The SARS-CoV-2 RNA is generally detectable in upper respiratoy specimens during the acute phase of infection. The lowest concentration of SARS-CoV-2 viral copies this assay can detect is 131 copies/mL. A negative result does not preclude SARS-Cov-2 infection and should not be used as the sole basis for treatment or other patient management decisions. A negative result may occur with  improper specimen collection/handling, submission of specimen other than nasopharyngeal swab, presence of viral mutation(s) within the areas targeted by this assay, and inadequate number of viral copies (<131 copies/mL). A negative result must be combined with clinical observations, patient history, and epidemiological information. The expected result is Negative. Fact Sheet for Patients:  PinkCheek.be Fact Sheet for Healthcare Providers:  GravelBags.it This test is not yet ap proved or cleared by the [place] FDA and  has been authorized for detection and/or diagnosis of SARS-CoV-2 by FDA under an Emergency Use Authorization (EUA). This EUA will remain  in effect (meaning this test can be used) for the duration of the COVID-19 declaration under Section 564(b)(1) of the Act, 21 U.S.C. section 360bbb-3(b)(1), unless the authorization is terminated or revoked sooner.    Influenza A by PCR NEGATIVE NEGATIVE Final   Influenza B by PCR NEGATIVE NEGATIVE Final    Comment: (NOTE) The Xpert Xpress SARS-CoV-2/FLU/RSV assay is intended as an aid in  the diagnosis of influenza from Nasopharyngeal swab specimens and  should not be used as a sole basis for treatment. Nasal washings and  aspirates are unacceptable for Xpert Xpress SARS-CoV-2/FLU/RSV  testing. Fact Sheet for  Patients: PinkCheek.be Fact Sheet for Healthcare Providers: GravelBags.it This test is not  yet approved or cleared by the [place] and  has been authorized for detection and/or diagnosis of SARS-CoV-2 by  FDA under an Emergency Use Authorization (EUA). This EUA will remain  in effect (meaning this test can be used) for the duration of the  Covid-19 declaration under Section 564(b)(1) of the Act, 21  U.S.C. section 360bbb-3(b)(1), unless the authorization is  terminated or revoked. Performed at [facility], [place] [address], [place], [place] [address]   Urine culture     Status: Abnormal   Collection Time: 07/07/19  5:29 PM   Specimen: In/Out Cath Urine  Result Value Ref Range Status   Specimen Description   Final    IN/OUT CATH URINE Performed at [facility] [address] [place], [place] [address]    Special Requests   Final    NONE Performed at [facility], [place] [address] [place], [place] [address]    Culture MULTIPLE SPECIES PRESENT, SUGGEST RECOLLECTION (A)  Final   Report Status 07/08/2019 FINAL  Final  MRSA PCR Screening     Status: None   Collection Time: 07/09/19  4:35 PM   Specimen: Nasopharyngeal  Result Value Ref Range Status   MRSA by PCR NEGATIVE NEGATIVE Final    Comment:        The GeneXpert MRSA Assay (FDA approved for NASAL specimens only), is one component of a comprehensive MRSA colonization surveillance program. It is not intended to diagnose MRSA infection nor to guide or monitor treatment for MRSA infections. Performed at [facility], [place] [address], [place], [place] [address]          Radiology Studies: IR GASTROSTOMY TUBE MOD SED  Result Date: 07/14/2019 INDICATION: History of lymphoma and neck mass with tracheostomy. Please perform gastrostomy tube placement for enteric nutrition  supplementation purposes. EXAM: PUSH GASTROSTOMY TUBE PLACEMENT COMPARISON:  CT abdomen pelvis-07/08/2019 MEDICATIONS: Patient is currently admitted to the hospital receiving intravenous antibiotics; Antibiotics were administered within 1 hour of the procedure. CONTRAST:  15 mL of Isovue 300 administered into the gastric lumen. ANESTHESIA/SEDATION: Moderate (conscious) sedation was employed during this procedure. A total of Versed 1 mg and Fentanyl 25 mcg was administered intravenously. Moderate Sedation Time: 12 minutes. The patient's level of consciousness and vital signs were monitored continuously by radiology nursing throughout the procedure under my direct supervision. FLUOROSCOPY TIME:  1 minute, 12 seconds (6 mGy) COMPLICATIONS: None immediate. PROCEDURE: Informed written consent was obtained from the patient following explanation of the procedure, risks, benefits and alternatives. A time out was performed prior to the initiation of the procedure. Ultrasound scanning was performed to demarcate the edge of the left lobe of the liver. Maximal barrier sterile technique utilized including caps, mask, sterile gowns, sterile gloves, large sterile drape, hand hygiene and Betadine prep. The left upper quadrant was sterilely prepped and draped. A oral gastric catheter was inserted into the stomach under fluoroscopy. The existing nasogastric feeding tube was removed. The left costal margin was marked. Air was injected into the stomach for insufflation and visualization under fluoroscopy. Under sterile conditions and local anesthesia, 3 T tacks were utilized to pexy the anterior aspect of the stomach against the ventral abdominal wall. Contrast injection confirmed appropriate positioning of each of the T tacks. An incision was made between the T tacks and a 17 gauge trocar needle was utilized to access the stomach. Needle position was confirmed within the stomach with aspiration of air and injection  of a small amount  of contrast. Next, over an Amplatz wire, track was serially dilated ultimately allowing placement a peel-away sheath and an 18-French balloon retention gastrostomy tube. The retention balloon was insufflated with a mixture of dilute saline and contrast and pulled taut against the anterior wall of the stomach. The external disc was cinched. Contrast injection confirms positioning within the stomach. Several spot radiographic images were obtained in various obliquities for documentation. The patient tolerated procedure well without immediate post procedural complication. FINDINGS: After successful fluoroscopic guided placement, the gastrostomy tube is appropriately positioned with internal retention balloon against the ventral aspect of the gastric lumen. IMPRESSION: Successful fluoroscopic insertion of an 13 French balloon retention gastrostomy tube. The gastrostomy may be used immediately for medication administration and in 24 hrs for the initiation of feeds. Electronically Signed   By: Sandi Mariscal M.D.   On: 07/14/2019 17:40        Scheduled Meds: . albuterol  2.5 mg Nebulization TID  . chlorhexidine  15 mL Mouth Rinse BID  . Chlorhexidine Gluconate Cloth  6 each Topical Daily  . feeding supplement (PRO-STAT SUGAR FREE 64)  30 mL Per Tube Daily  . glycopyrrolate  0.1 mg Intravenous BID  . mouth rinse  15 mL Mouth Rinse BID  . mouth rinse  15 mL Mouth Rinse q12n4p  . thiamine  100 mg Intravenous Daily   Continuous Infusions: . sodium chloride Stopped (07/15/19 1037)  . dextrose 75 mL/hr at 07/16/19 0844  . feeding supplement (OSMOLITE 1.5 CAL) 20 mL/hr at 07/16/19 0435     LOS: 9 days   The patient is critically ill with multiple organ systems failure and requires high complexity decision making for assessment and support, frequent evaluation and titration of therapies, application of advanced monitoring technologies and extensive interpretation of multiple databases. Critical Care Time  devoted to patient care services described in this note  Time spent: 40 minutes     Kollen Armenti, Geraldo Docker, MD [facility] Pager [phone]  If 7PM-7AM, please contact night-coverage [email or web address] Password [facility] 07/16/2019, 11:23 AM

## 2019-07-16 NOTE — Progress Notes (Signed)
PT Cancellation Note  Patient Details Name: Devon Peterson MRN: [id number] DOB: 1955/10/19   Cancelled Treatment:    Reason Eval/Treat Not Completed: Pain limiting ability to participate, indicating site of PEG. Attempted several minutes, even to just sit at bed edge, but pt declined.    Claretha Cooper 07/16/2019, 1:16 PM [place] Pager [phone] Office [phone]

## 2019-07-16 NOTE — Telephone Encounter (Signed)
OK- Appt with MR for 08/14/19 was canceled.

## 2019-07-16 NOTE — Progress Notes (Signed)
Received call from patient's sister [name].  She called for an update on her brother's condition.  Sister was concerned because family was not notified about patient being moved to ICU.

## 2019-07-16 NOTE — TOC Progression Note (Signed)
Transition of Care [facility]) - Progression Note    Patient Details  Name: Devon Peterson MRN: [id number] Date of Birth: 20-Jan-1956  Transition of Care [facility]) CM/SW Contact  Leeroy Cha, RN Phone Number: 07/16/2019, 8:39 AM  Clinical Narrative:    Lurline Idol collar at 28%, snf bed-no offers, feeding osmolite per g.tube  Tolerating well, Na 121, oncolgy Sherill seeing due to lung mass.   Expected Discharge Plan: [place] Barriers to Discharge: Continued Medical Work up  Expected Discharge Plan and Services Expected Discharge Plan: [place] Acute Care Choice: [place]                                         Social Determinants of Health (SDOH) Interventions    Readmission Risk Interventions No flowsheet data found.

## 2019-07-17 ENCOUNTER — Encounter (HOSPITAL_COMMUNITY): Payer: Self-pay | Admitting: Internal Medicine

## 2019-07-17 DIAGNOSIS — C4442 Squamous cell carcinoma of skin of scalp and neck: Secondary | ICD-10-CM

## 2019-07-17 DIAGNOSIS — Z7189 Other specified counseling: Secondary | ICD-10-CM

## 2019-07-17 DIAGNOSIS — Z515 Encounter for palliative care: Secondary | ICD-10-CM

## 2019-07-17 LAB — COMPREHENSIVE METABOLIC PANEL
ALT: 11 U/L (ref 0–44)
AST: 19 U/L (ref 15–41)
Albumin: 2.2 g/dL — ABNORMAL LOW (ref 3.5–5.0)
Alkaline Phosphatase: 57 U/L (ref 38–126)
Anion gap: 8 (ref 5–15)
BUN: 8 mg/dL (ref 8–23)
CO2: 23 mmol/L (ref 22–32)
Calcium: 7.6 mg/dL — ABNORMAL LOW (ref 8.9–10.3)
Chloride: 90 mmol/L — ABNORMAL LOW (ref 98–111)
Creatinine, Ser: 0.53 mg/dL — ABNORMAL LOW (ref 0.61–1.24)
GFR calc Af Amer: >60 mL/min (ref >60)
GFR calc non Af Amer: >60 mL/min (ref >60)
Glucose, Bld: 104 mg/dL — ABNORMAL HIGH (ref 70–99)
Potassium: 4 mmol/L (ref 3.5–5.1)
Sodium: 121 mmol/L — ABNORMAL LOW (ref 135–145)
Total Bilirubin: 0.5 mg/dL (ref 0.3–1.2)
Total Protein: 5.7 g/dL — ABNORMAL LOW (ref 6.5–8.1)

## 2019-07-17 LAB — BASIC METABOLIC PANEL
Anion gap: 9 (ref 5–15)
BUN: 11 mg/dL (ref 8–23)
CO2: 23 mmol/L (ref 22–32)
Calcium: 7.4 mg/dL — ABNORMAL LOW (ref 8.9–10.3)
Chloride: 89 mmol/L — ABNORMAL LOW (ref 98–111)
Creatinine, Ser: 0.46 mg/dL — ABNORMAL LOW (ref 0.61–1.24)
GFR calc Af Amer: >60 mL/min (ref >60)
GFR calc non Af Amer: >60 mL/min (ref >60)
Glucose, Bld: 131 mg/dL — ABNORMAL HIGH (ref 70–99)
Potassium: 3.9 mmol/L (ref 3.5–5.1)
Sodium: 121 mmol/L — ABNORMAL LOW (ref 135–145)

## 2019-07-17 LAB — GLUCOSE, CAPILLARY
Glucose-Capillary: 106 mg/dL — ABNORMAL HIGH (ref 70–99)
Glucose-Capillary: 121 mg/dL — ABNORMAL HIGH (ref 70–99)
Glucose-Capillary: 123 mg/dL — ABNORMAL HIGH (ref 70–99)
Glucose-Capillary: 136 mg/dL — ABNORMAL HIGH (ref 70–99)
Glucose-Capillary: 148 mg/dL — ABNORMAL HIGH (ref 70–99)
Glucose-Capillary: 73 mg/dL (ref 70–99)

## 2019-07-17 LAB — PHOSPHORUS: Phosphorus: 3.1 mg/dL (ref 2.5–4.6)

## 2019-07-17 LAB — CBC WITH DIFFERENTIAL/PLATELET
Abs Immature Granulocytes: 0.03 10*3/uL (ref 0.00–0.07)
Basophils Absolute: 0 10*3/uL (ref 0.0–0.1)
Basophils Relative: 0 %
Eosinophils Absolute: 0 10*3/uL (ref 0.0–0.5)
Eosinophils Relative: 0 %
HCT: 27.5 % — ABNORMAL LOW (ref 39.0–52.0)
Hemoglobin: 9.4 g/dL — ABNORMAL LOW (ref 13.0–17.0)
Immature Granulocytes: 1 %
Lymphocytes Relative: 23 %
Lymphs Abs: 1.3 10*3/uL (ref 0.7–4.0)
MCH: 27.6 pg (ref 26.0–34.0)
MCHC: 34.2 g/dL (ref 30.0–36.0)
MCV: 80.6 fL (ref 80.0–100.0)
Monocytes Absolute: 0.5 10*3/uL (ref 0.1–1.0)
Monocytes Relative: 9 %
Neutro Abs: 3.7 10*3/uL (ref 1.7–7.7)
Neutrophils Relative %: 67 %
Platelets: 324 10*3/uL (ref 150–400)
RBC: 3.41 MIL/uL — ABNORMAL LOW (ref 4.22–5.81)
RDW: 12.4 % (ref 11.5–15.5)
WBC: 5.6 10*3/uL (ref 4.0–10.5)
nRBC: 0 % (ref 0.0–0.2)

## 2019-07-17 LAB — MAGNESIUM: Magnesium: 2 mg/dL (ref 1.7–2.4)

## 2019-07-17 MED ORDER — ALBUTEROL SULFATE (2.5 MG/3ML) 0.083% IN NEBU
2.5000 mg | INHALATION_SOLUTION | Freq: Every day | RESPIRATORY_TRACT | Status: DC
  Administered 2019-07-18 – 2019-07-27 (×10): 2.5 mg via RESPIRATORY_TRACT
  Filled 2019-07-17 (×10): qty 3

## 2019-07-17 MED ORDER — ONDANSETRON HCL 4 MG/2ML IJ SOLN
4.0000 mg | Freq: Four times a day (QID) | INTRAMUSCULAR | Status: DC | PRN
  Administered 2019-08-01 – 2019-08-22 (×2): 4 mg via INTRAVENOUS
  Filled 2019-07-17 (×3): qty 2

## 2019-07-17 MED ORDER — DM-GUAIFENESIN ER 30-600 MG PO TB12
1.0000 | ORAL_TABLET | Freq: Two times a day (BID) | ORAL | Status: AC
  Administered 2019-07-17: 1 via ORAL
  Filled 2019-07-17: qty 1

## 2019-07-17 MED ORDER — SODIUM CHLORIDE 1 G PO TABS
2.0000 g | ORAL_TABLET | Freq: Three times a day (TID) | ORAL | Status: DC
  Administered 2019-07-17 – 2019-07-18 (×5): 2 g
  Filled 2019-07-17 (×7): qty 2
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
## 2019-07-17 NOTE — Progress Notes (Signed)
   Subjective:    [name], male    DOB: 1956/02/21, 64 y.o.   MRN: [id number]  HPI Asked to evaluate the patient to up-size his tracheostomy tube due to thick secretions that have been hard to suction.  Review of Systems     Objective:   Physical Exam Alert, NAD #4 cuffed Shiley in place with sutures Removed sutures and changed trach tube to #6 cuffless without difficulty     Assessment & Plan:  Hypopharyngeal cancer, tracheostomy status  His trach tube was changed to a #6 cuffless Shiley without difficulty.

## 2019-07-17 NOTE — Progress Notes (Signed)
PROGRESS NOTE    Nhan Qualley  [id number] DOB: Oct 29, 1955 DOA: 07/07/2019 PCP: Patient, No Pcp Per   Brief Narrative:  Hemi Chacko a 64 y.o.BM PMHx COPD,Polysubstance abuse (EtOH, tobacco, Cocaine), hospital admission 04/17/2019-04/19/2019 for sepsis secondary to pneumonia and COPD exacerbation  Presenting to the ED via EMS for evaluation of cough, shortness of breath, fatigue, and unintentional weight loss.History provided by patient and his nephew at bedside. Nephew states patient was admitted to the hospital a month ago for pneumonia and since then has continued to do poorly. He has difficulty swallowing food and has been spitting up saliva. He is coughing a lot. Patient denies fevers, chills, shortness of breath, chest pain, nausea, vomiting, abdominal pain, diarrhea, or dysuria. Nephew states patient has been smoking a pack of cigarettes daily since a very young age. He has lost a lot of weight recently. Addendum: 07/07/2019 8:40 PM. Patient also denied history of hematemesis, hematochezia, or melena.  ED Course:Tachycardic on arrival with heart rate in the 150s, appeared to be sinus rhythm. Afebrile. Slightly tachypneic. Labs showing no leukocytosis. Initial lactic acid 2.5, repeat after IV fluid pending. Hemoglobin 12.7, stable compared to labs done in February 2021. Platelet count normal. BUN 27, creatinine 0.8. Albumin 3.3. LFTs normal. Blood culture x2 pending. SARS-CoV-2 PCR test negative. Influenza panel negative. UA not suggestive of infection. Urine culture pending. Chest x-ray showing new left lower lobe airspace opacity concerning for pneumonia. CT angiogram chest negative for PE. Showing abnormal soft tissue in the hypopharynx suspicious for underlying mass. Dense bilateral lower lobe consolidation, left greater than right. Aspiration suspected given findings in the hypopharynx. Also showing evidence of an enhancing mass within the densely consolidated  left lower lobe, metastatic disease or primary neoplasm not excluded. Marked cachexia.  Patient received ceftriaxone, azithromycin, and 2 L normal saline boluses.  **Interim History He was evaluated by ENT who recommended a tracheostomy.  Tracheostomy was done on 07/10/19 and postoperatively he ended up on pressors which were weaned off.  He is also getting a tissue biopsy and medical oncology as well as radiation oncology were consulted for further evaluation.  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 Postoperatively he had hypotension and went in A. fib with RVR and started on Neo-Synephrine drip has been stopped.  Critical care was consulted for further evaluation and his hypotension is improved.  We will continue IV fluid but patient needs long-term nutrition needs and needs a PEG tube and have called IR but they will reevaluate Monday for a PEG tube given the national shortage of PEG tube.  On 07/11/19 Patient's tracheostomy was doing okay but he was having a lot of secretions but not as much.  Again radiation oncology wants to have improvement in the patient's performance status as well as nutritional status as well as further evaluation of the lung mass with likely pet imaging as an outpatient and potential biopsy of lung lesion to determine if this is metastatic versus a second primary.  This will help determine radiation oncology if treatment will be of curative or more of a palliative approach.  They will continue to collaborate with medical oncology  On 07/12/19 he continued to have significant amount of secretions however they are becoming thicker we will try Mucomyst as well as Robinul to thin the secretions.  He denies any current pain at this time but was very weak and is severely deconditioned so PT OT was reconsulted again.   Subjective: 5/14 alert, nods yes and no to  questions.  Negative CP, negative abdominal pain, negative S OB.   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 Wilburn Cornelia ENT placed tracheostomy -5/13 oncology sent PD-L1 testing on pathology. -5/13  Recommend a course of palliative or definitive radiation.  Radiation oncology consult has been completed.  Awaiting final recommendations. -5/13 oncology Will follow up on the lung mass and determine further work-up at a future date. - Outpatient follow-up at the cancer center -Continued thick secretions requiring frequent suctioning. -5/14 discussed case with Dr. Redmond Baseman [facility] ENT has agreed to upsize trach to #6 cuffless to help with secretions. -5/14 continue hypertonic saline nebulizer daily followed by albuterol nebulizer treatment - COPD -See aspiration pneumonia  Dysphagia -5/11 PEG tube placed -Osmolite 1.5 @ 20 ml/hr to advance by 10 ml every 12 hours to reach goal rate of 50 ml/hr -30 ml prostat once/day.  A. fib with RVR -Went into A. fib with RVR in the PACU and was given IV metoprolol -Heart rates have improved some but he is hypotensive and had to be placed on a Neo-Synephrine drip -We will not anticoagulate at this time but may need anticoagulation soon -5/12 currently NSR  Hypotension -Resolved  Severe protein calorie malnutrition/Underweight -See dysphagia   Generalized weakness/physical deconditioning: -PT and OT initial evaluation recommending Home Health PT but re-evaluation recommending SNF now given his condition  EtOH abuse -Folic acid 1 mg daily -Thiamine 100 mg daily -Currently no signs or symptoms of  withdrawal.  Cocaine abuse -5/5 positive cocaine -Avoid beta-blockers, although if absolutely required for control of HTN this far out would be safe  Tobacco abuse -NicoDerm patch -Counseled on need for absolute cessation  Hypoglycemia -In the setting of poor p.o. intake and was persistent -An outpatient on tube feeds should resolve -5/13 discontinue D10    Hypokalemia -Potassium goal> 4  Hypomagnesmia -Magnesium goal> 2 -Magnesium 4 g  Hypophosphatemia -See hyponatremia  Hyponatremia -Normal saline 22m/hr -5/14 NaCl 2 g TID  -5/14 repeat BMP@ 1600 if sodium not stabilized/decreased will need to start 3% saline IV  Normocytic Anemia -Patient's hemoglobin/hematocit is dropping slowly and went from 11.3/34.3 -> 10.0/29.5 -> 9.2/27.0 -Check anemia panel and showed an iron level of 30, U IBC 130, TIBC 150, saturation ratios of 19%, ferritin level 421, folate level 11.5, and vitamin B12 of 281 -Continue to monitor for signs and symptoms of bleeding; currently had a lot of bleeding from his trach site to be injected with lidocaine   Stage II sacral pressure ulcer (POA Pressure Injury 07/07/19 Sacrum Mid;Upper Stage 2 -  Partial thickness loss of dermis presenting as a shallow open injury with a red, pink wound bed without slough. 3 each small separate open areas (Active)  07/07/19 2107  Location: Sacrum  Location Orientation: Mid;Upper  Staging: Stage 2 -  Partial thickness loss of dermis presenting as a shallow open injury with a red, pink wound bed without slough.  Wound Description (Comments): 3 each small separate open areas  Present on Admission: Yes  -Continue with Mepilex pads and frequent  turning    Goals of care 5/12 palliative care consult; patient with poorly differentiated squamous carcinoma, extremely cachectic now trach and PEG dependent.  Evaluate for change of CODE STATUS, consider hospice --PT OT recommending SNF.  Will await palliative care  recommendations   DVT prophylaxis: SCD Code Status: Full Family Communication:  Disposition Plan:  Status is: Inpatient  Dispo: The patient is from: Home              Anticipated d/c is to: SNF?  Hospice?              Anticipated d/c date is: 07/22/2019              Patient currently unstable      Consultants:  PCCM Oncology Dr. 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  VENTILATOR SETTINGS:    Cultures   Antimicrobials: Anti-infectives (From admission, onward)   Start     Dose/Rate Stop   07/07/19 2200  ampicillin-sulbactam (UNASYN) 1.5 g in sodium chloride 0.9 % 100 mL IVPB     1.5 g 200 mL/hr over 30 Minutes 07/14/19 1606   07/07/19 1930  metroNIDAZOLE (FLAGYL) IVPB 500 mg  Status:  Discontinued     500 mg 100 mL/hr over 60 Minutes 07/07/19 2003   07/07/19 1800  cefTRIAXone (ROCEPHIN) 1 g in sodium chloride 0.9 % 100 mL IVPB     1 g 200 mL/hr over 30 Minutes 07/07/19 1830   07/07/19 1800  azithromycin (ZITHROMAX) 500 mg in sodium chloride 0.9 % 250 mL IVPB     500 mg 250 mL/hr over 60 Minutes 07/07/19 1855       Devices    LINES / TUBES:      Continuous Infusions: . sodium chloride 75 mL/hr at 07/17/19 0641  . feeding supplement (OSMOLITE 1.5 CAL) 50 mL/hr at 07/17/19 0918     Objective: Vitals:   07/17/19 0800 07/17/19 0828 07/17/19 0900 07/17/19  1000  BP: 135/84  (!) 172/103 122/89  Pulse: 82 84 92 100  Resp: 20 (!) 22 (!) 26 (!) 24  Temp: 98 F (36.7 C)     TempSrc: Oral     SpO2: 100% 99% 92% 95%  Weight:      Height:        Intake/Output Summary (Last 24 hours) at 07/17/2019 1023 Last data filed at 07/17/2019 0600 Gross per 24 hour  Intake 2022.49 ml  Output 750 ml  Net 1272.49 ml   Filed Weights   07/07/19 2049 07/09/19 0949  Weight: 42 kg 42 kg   Physical Exam:  General: Alert, nods head yes and no to questions appropriately, positive acute respiratory distress, cachectic Eyes: negative scleral hemorrhage, negative anisocoria, negative icterus ENT: Negative Runny nose, negative gingival bleeding, Neck:  Negative scars, masses, torticollis, lymphadenopathy, JVD Lungs: Diffuse rhonchi RIGHT >> LEFT  bilaterally without wheezes or crackles Cardiovascular: Regular rate and rhythm without murmur gallop or  rub normal S1 and S2 Abdomen: negative abdominal pain, nondistended, positive soft, bowel sounds, no rebound, no ascites, no appreciable mass, PEG tube in place covered and clean negative sign of infection Extremities: No significant cyanosis, clubbing, or edema bilateral lower extremities Skin: Negative rashes, lesions, ulcers Psychiatric:  Negative depression, negative anxiety, negative fatigue, negative mania  Central nervous system:  Cranial nerves II through XII intact, tongue/uvula midline, all extremities muscle strength 5/5, sensation intact throughout, negative dysarthria, negative expressive aphasia, negative receptive aphasia.   .     Data Reviewed: Care during the described time interval was provided by me .  I have reviewed this patient's available data, including medical history, events of note, physical examination, and all test results as part of my evaluation.   CBC: Recent Labs  Lab 07/13/19 0412 07/14/19 0240 07/15/19 0352 07/16/19 0437 07/17/19 0348  WBC 5.7 4.3 4.7 4.7 5.6  NEUTROABS  4.1 2.6 3.1 2.8 3.7  HGB 10.0* 9.2* 9.5* 9.7* 9.4*  HCT 29.5* 27.0* 28.4* 27.7* 27.5*  MCV 83.3 83.3 84.5 82.2 80.6  PLT 212 227 281 297 008   Basic Metabolic Panel: Recent Labs  Lab 07/13/19 0412 07/14/19 0240 07/15/19 0352 07/16/19 0437 07/17/19 0348  NA 132* 133* 132* 121* 121*  K 3.5 3.4* 3.2* 3.7 4.0  CL 95* 98 98 91* 90*  CO2 _0 GLUCOSE 84 106* 128* 134* 104*  BUN <5* <5* <5* <5* 8  CREATININE 0.45* 0.46* 0.47* 0.45* 0.53*  CALCIUM 8.1* 7.9* 8.2* 7.8* 7.6*  MG 1.7 1.9 1.6* 1.5* 2.0  PHOS 3.5 3.0 2.3* 2.3* 3.1   GFR: Estimated Creatinine Clearance: 55.4 mL/min (A) (by C-G formula based on SCr of 0.53 mg/dL (L)). Liver Function Tests: Recent Labs  Lab 07/13/19 0412 07/14/19 0240 07/15/19 0352 07/16/19 0437 07/17/19 0348  AST 14* 12* _1 ALT _2 ALKPHOS 52 51 47 54 57  BILITOT 0.6 0.4 0.3 0.6 0.5  PROT 5.5* 5.2* 5.5* 5.5* 5.7*  ALBUMIN 2.2* 2.0* 2.2* 2.1* 2.2*   No results for input(s): LIPASE, AMYLASE in the last 168 hours. No results for input(s): AMMONIA in the last 168 hours. Coagulation Profile: Recent Labs  Lab 07/13/19 0455  INR 1.1   Cardiac Enzymes: No results for input(s): CKTOTAL, CKMB, CKMBINDEX, TROPONINI in the last 168 hours. BNP (last 3 results) No results for input(s): PROBNP in the last 8760 hours. HbA1C: No results for input(s): HGBA1C in the last 72 hours. CBG: Recent Labs  Lab 07/16/19 1609 07/16/19 1958 07/16/19 2346 07/17/19 0323 07/17/19 0755  GLUCAP 145* 105* 98 123* 73   Lipid Profile: No results for input(s): CHOL, HDL, LDLCALC, TRIG, CHOLHDL, LDLDIRECT in the last 72 hours. Thyroid Function Tests: No results for input(s): TSH, T4TOTAL, FREET4, T3FREE, THYROIDAB in the last 72 hours. Anemia Panel: No results for input(s): VITAMINB12, FOLATE, FERRITIN, TIBC, IRON, RETICCTPCT in the last 72 hours. Urine analysis:    Component Value Date/Time   COLORURINE YELLOW 07/07/2019 1729    APPEARANCEUR HAZY (A) 07/07/2019 1729   LABSPEC 1.016 07/07/2019 1729   PHURINE 5.0 07/07/2019 1729   GLUCOSEU NEGATIVE 07/07/2019 1729   HGBUR NEGATIVE 07/07/2019 1729   BILIRUBINUR NEGATIVE 07/07/2019 1729   KETONESUR NEGATIVE 07/07/2019 1729   PROTEINUR 30 (A) 07/07/2019 1729   UROBILINOGEN 1.0 10/28/2008 0458   NITRITE NEGATIVE 07/07/2019 1729   LEUKOCYTESUR NEGATIVE 07/07/2019 1729   Sepsis Labs: _3 (procalcitonin:4,lacticidven:4)  ) Recent  Results (from the past 240 hour(s))  Blood Culture (routine x 2)     Status: None   Collection Time: 07/07/19  4:45 PM   Specimen: BLOOD LEFT HAND  Result Value Ref Range Status   Specimen Description   Final    BLOOD LEFT HAND Performed at [facility] [address], [place], [place] [address]    Special Requests   Final    BOTTLES DRAWN AEROBIC ONLY Blood Culture results may not be optimal due to an inadequate volume of blood received in culture bottles Performed at [facility] [address] [place], [place] [address]    Culture   Final    NO GROWTH 5 DAYS Performed at [facility], [place] [address] [place], [place] [address]    Report Status 07/12/2019 FINAL  Final  Blood Culture (routine x 2)     Status: None   Collection Time: 07/07/19  4:46 PM   Specimen: BLOOD  Result Value Ref Range Status   Specimen Description   Final    BLOOD RIGHT ANTECUBITAL Performed at [facility] [address], [place], [place] [address]    Special Requests   Final    BOTTLES DRAWN AEROBIC AND ANAEROBIC Blood Culture results may not be optimal due to an inadequate volume of blood received in culture bottles Performed at [facility] [address] [place], [place] [address]    Culture   Final    NO GROWTH 5 DAYS Performed at [facility], [place] [address], [place], [place] [address]    Report Status 07/12/2019 FINAL  Final  Respiratory Panel  by RT PCR (Flu A&B, Covid) - Nasopharyngeal Swab     Status: None   Collection Time: 07/07/19  5:14 PM   Specimen: Nasopharyngeal Swab  Result Value Ref Range Status   SARS Coronavirus 2 by RT PCR NEGATIVE NEGATIVE Final    Comment: (NOTE) SARS-CoV-2 target nucleic acids are NOT DETECTED. The SARS-CoV-2 RNA is generally detectable in upper respiratoy specimens during the acute phase of infection. The lowest concentration of SARS-CoV-2 viral copies this assay can detect is 131 copies/mL. A negative result does not preclude SARS-Cov-2 infection and should not be used as the sole basis for treatment or other patient management decisions. A negative result may occur with  improper specimen collection/handling, submission of specimen other than nasopharyngeal swab, presence of viral mutation(s) within the areas targeted by this assay, and inadequate number of viral copies (<131 copies/mL). A negative result must be combined with clinical observations, patient history, and epidemiological information. The expected result is Negative. Fact Sheet for Patients:  PinkCheek.be Fact Sheet for Healthcare Providers:  GravelBags.it This test is not yet ap proved or cleared by the [place] FDA and  has been authorized for detection and/or diagnosis of SARS-CoV-2 by FDA under an Emergency Use Authorization (EUA). This EUA will remain  in effect (meaning this test can be used) for the duration of the COVID-19 declaration under Section 564(b)(1) of the Act, 21 U.S.C. section 360bbb-3(b)(1), unless the authorization is terminated or revoked sooner.    Influenza A by PCR NEGATIVE NEGATIVE Final   Influenza B by PCR NEGATIVE NEGATIVE Final    Comment: (NOTE) The Xpert Xpress SARS-CoV-2/FLU/RSV assay is intended as an aid in  the diagnosis of influenza from Nasopharyngeal swab specimens and  should not be used as a sole basis for treatment.  Nasal washings and  aspirates are unacceptable for Xpert Xpress SARS-CoV-2/FLU/RSV  testing. Fact Sheet for Patients: PinkCheek.be Fact Sheet for Healthcare Providers: GravelBags.it This test is not yet approved or cleared by the [place] FDA and  has been authorized for detection and/or diagnosis of SARS-CoV-2 by  FDA under an Emergency Use Authorization (EUA). This EUA will remain  in effect (meaning this test can be used) for the duration of the  Covid-19 declaration under Section 564(b)(1) of the Act, 21  U.S.C. section 360bbb-3(b)(1), unless the authorization is  terminated or revoked. Performed at [facility], [place] [address] [place], [place] [address]   Urine culture     Status: Abnormal   Collection Time: 07/07/19  5:29 PM   Specimen: In/Out Cath Urine  Result Value Ref Range Status   Specimen Description   Final    IN/OUT CATH URINE Performed at [facility] [address] [place], [place] [address]    Special Requests   Final    NONE Performed at [facility], [place] [address], [place], [place] [address]    Culture MULTIPLE SPECIES PRESENT, SUGGEST RECOLLECTION (A)  Final   Report Status 07/08/2019 FINAL  Final  MRSA PCR Screening     Status: None   Collection Time: 07/09/19  4:35 PM   Specimen: Nasopharyngeal  Result Value Ref Range Status   MRSA by PCR NEGATIVE NEGATIVE Final    Comment:        The GeneXpert MRSA Assay (FDA approved for NASAL specimens only), is one component of a comprehensive MRSA colonization surveillance program. It is not intended to diagnose MRSA infection nor to guide or monitor treatment for MRSA infections. Performed at [facility], [place] [address] [place], [place] [address]          Radiology Studies: No results found.      Scheduled Meds: . albuterol  2.5 mg Nebulization TID  .  chlorhexidine  15 mL Mouth Rinse BID  . Chlorhexidine Gluconate Cloth  6 each Topical Daily  . feeding supplement (PRO-STAT SUGAR FREE 64)  30 mL Per Tube Daily  . glycopyrrolate  0.1 mg Intravenous BID  . mouth rinse  15 mL Mouth Rinse BID  . mouth rinse  15 mL Mouth Rinse q12n4p  . sodium chloride HYPERTONIC  4 mL Nebulization Daily  . thiamine  100 mg Intravenous Daily   Continuous Infusions: . sodium chloride 75 mL/hr at 07/17/19 0641  . feeding supplement (OSMOLITE 1.5 CAL) 50 mL/hr at 07/17/19 0918     LOS: 10 days   The patient is critically ill with multiple organ systems failure and requires high complexity decision making for assessment and support, frequent evaluation and titration of therapies, application of advanced monitoring technologies and extensive interpretation of multiple databases. Critical Care Time devoted to patient care services described in this note  Time spent: 40 minutes     WOODS, Geraldo Docker, MD [facility] Pager [phone]  If 7PM-7AM, please contact night-coverage [email or web address] Password [facility] 07/17/2019, 10:23 AM

## 2019-07-17 NOTE — Progress Notes (Signed)
MEDICATION-RELATED CONSULT NOTE   IR Procedure Consult - Anticoagulant/Antiplatelet PTA/Inpatient Med List Review by Pharmacist    Procedure: insertion of gastrostomy tube performed on 07/13/12   Consult was entered and signed&held on 07/14/19.    The consult was NOT released until 3 days after procedure (on 5/14).  Hence, it's not applicable to address at this time.  Pharmacy will sign off.  Dia Sitter, PharmD, BCPS 07/17/2019 3:40 AM

## 2019-07-17 NOTE — Progress Notes (Signed)
Physical Therapy Treatment Patient Details Name: Devon Peterson MRN: [id number] DOB: [birth year] Today's Date: 07/17/2019    History of Present Illness 64 yo male admitted with aspiration pneumonia, with newly identified mass located in the hypopharynx.  Went to the operating room on 5/6 for direct laryngoscopy, biopsy, and tracheostomy.  Marland Kitchen Hx of ETOH abuse, COPD, dysphagia, polysubstance abuse, malnutrition,    PT Comments    Pt in bed easily aroused on 5lts 28% TRACH 96% .  Assisted OOB to recliner only. General bed mobility comments: extra time, line management.  Weak.  Poor collapsed posture.  EOB x 3 min at [facility] [facility].  BP decreased to 99/76 and HR increased to 140's.  Perfomed bed to recliner transfer only.  General transfer comment: management for lines and + 2 assist for safety.   General Gait Details: did not attempt due to lower BP and elevated HR as well as pt unable to tolerate this session. Positioned in recliner to comfort.    Follow Up Recommendations  SNF     Equipment Recommendations  None recommended by PT    Recommendations for Other Services       Precautions / Restrictions Precautions Precaution Comments: trach on 28%5 liters TC; PEG/NPO Restrictions Weight Bearing Restrictions: No    Mobility  Bed Mobility Overal bed mobility: Needs Assistance Bed Mobility: Supine to Sit     Supine to sit: Min assist     General bed mobility comments: extra time, line management.  Weak.  Poor collapsed posture.  EOB x 3 min at [facility] [facility].  BP decreased to 99/76 and HR increased to 140's.  Perfomed bed to recliner transfer only.  Transfers Overall transfer level: Needs assistance Equipment used: Rolling walker (2 wheeled) Transfers: Sit to/from [facility] Sit to Stand: Min assist;+2 safety/equipment Stand pivot transfers: Mod assist;+2 safety/equipment       General transfer comment: management for lines and + 2 assist for  safety  Ambulation/Gait             General Gait Details: did not attempt due to lower BP and elevated HR as well as pt unable to tolerate this session.   Stairs             Wheelchair Mobility    Modified Rankin (Stroke Patients Only)       Balance                                            Cognition Arousal/Alertness: Awake/alert Behavior During Therapy: Flat affect                                   General Comments: non verbal TRACH but following all commands able to let me know by motioning he was dizzy when EOB      Exercises      General Comments        Pertinent Vitals/Pain Pain Assessment: No/denies pain    Home Living                      Prior Function            PT Goals (current goals can now be found in the care plan section) Progress towards PT goals: Progressing toward goals    Frequency  Min 2X/week      PT Plan Current plan remains appropriate;Discharge plan needs to be updated;Frequency needs to be updated    Co-evaluation              AM-PAC PT "6 Clicks" Mobility   Outcome Measure  Help needed turning from your back to your side while in a flat bed without using bedrails?: A Lot Help needed moving from lying on your back to sitting on the side of a flat bed without using bedrails?: A Lot Help needed moving to and from a bed to a chair (including a wheelchair)?: A Lot Help needed standing up from a chair using your arms (e.g., wheelchair or bedside chair)?: A Lot Help needed to walk in hospital room?: A Lot Help needed climbing 3-5 steps with a railing? : Total 6 Click Score: 11    End of Session Equipment Utilized During Treatment: Gait belt Activity Tolerance: Other (comment) Patient left: in chair;with call bell/phone within reach;with chair alarm set Nurse Communication: Mobility status PT Visit Diagnosis: Muscle weakness (generalized) (M62.81);Difficulty in  walking, not elsewhere classified (R26.2)     Time: 4128-7867 PT Time Calculation (min) (ACUTE ONLY): 23 min  Charges:  $Therapeutic Activity: 23-37 mins                     Rica Koyanagi  PTA Acute  Rehabilitation Services Pager      [phone] Office      [phone]

## 2019-07-17 NOTE — Progress Notes (Signed)
Oncology Nurse Navigator Documentation  Placed introductory call to Britta Mccreedy sister of new referral Barrett Goldie  Introduced myself as the H&N oncology nurse navigator that works with Dr. Isidore Moos to whom he has been referred by Dr. Wilburn Cornelia   She confirmed understanding of referral.  Briefly explained my role as his navigator, provided my contact information.   Confirmed understanding of upcoming appt in Radiation Oncology on 07/20/19 at 12:15. I confirmed that I would notify Dr. Isidore Moos to call her when she was speaking with Mr. Levinson so that she could be part of the conversation.   I encouraged her to call with questions/concerns as Mr. Plemmons moves forward with appts and procedures.    She verbalized understanding of information provided, expressed appreciation for my call.  Harlow Asa RN, BSN, OCN Head & Neck Oncology Nurse [place] [facility] Phone # [phone]  Fax # [phone]

## 2019-07-18 LAB — GLUCOSE, CAPILLARY
Glucose-Capillary: 141 mg/dL — ABNORMAL HIGH (ref 70–99)
Glucose-Capillary: 114 mg/dL — ABNORMAL HIGH (ref 70–99)
Glucose-Capillary: 132 mg/dL — ABNORMAL HIGH (ref 70–99)
Glucose-Capillary: 82 mg/dL (ref 70–99)
Glucose-Capillary: 95 mg/dL (ref 70–99)
Glucose-Capillary: 88 mg/dL (ref 70–99)

## 2019-07-18 LAB — COMPREHENSIVE METABOLIC PANEL WITH GFR
ALT: 10 U/L (ref 0–44)
AST: 26 U/L (ref 15–41)
Albumin: 2.1 g/dL — ABNORMAL LOW (ref 3.5–5.0)
Alkaline Phosphatase: 55 U/L (ref 38–126)
Anion gap: 9 (ref 5–15)
BUN: 10 mg/dL (ref 8–23)
CO2: 22 mmol/L (ref 22–32)
Calcium: 7.4 mg/dL — ABNORMAL LOW (ref 8.9–10.3)
Chloride: 91 mmol/L — ABNORMAL LOW (ref 98–111)
Creatinine, Ser: 0.44 mg/dL — ABNORMAL LOW (ref 0.61–1.24)
GFR calc Af Amer: >60 mL/min (ref >60)
GFR calc non Af Amer: >60 mL/min (ref >60)
Glucose, Bld: 98 mg/dL (ref 70–99)
Potassium: 4.3 mmol/L (ref 3.5–5.1)
Sodium: 122 mmol/L — ABNORMAL LOW (ref 135–145)
Total Bilirubin: 0.5 mg/dL (ref 0.3–1.2)
Total Protein: 5.4 g/dL — ABNORMAL LOW (ref 6.5–8.1)

## 2019-07-18 LAB — CBC WITH DIFFERENTIAL/PLATELET
Abs Immature Granulocytes: 0.02 10*3/uL (ref 0.00–0.07)
Basophils Absolute: 0 10*3/uL (ref 0.0–0.1)
Basophils Relative: 0 %
Eosinophils Absolute: 0 10*3/uL (ref 0.0–0.5)
Eosinophils Relative: 0 %
HCT: 25.6 % — ABNORMAL LOW (ref 39.0–52.0)
Hemoglobin: 8.8 g/dL — ABNORMAL LOW (ref 13.0–17.0)
Immature Granulocytes: 0 %
Lymphocytes Relative: 21 %
Lymphs Abs: 1.1 10*3/uL (ref 0.7–4.0)
MCH: 28.1 pg (ref 26.0–34.0)
MCHC: 34.4 g/dL (ref 30.0–36.0)
MCV: 81.8 fL (ref 80.0–100.0)
Monocytes Absolute: 0.5 10*3/uL (ref 0.1–1.0)
Monocytes Relative: 10 %
Neutro Abs: 3.5 10*3/uL (ref 1.7–7.7)
Neutrophils Relative %: 69 %
Platelets: 323 10*3/uL (ref 150–400)
RBC: 3.13 MIL/uL — ABNORMAL LOW (ref 4.22–5.81)
RDW: 12.5 % (ref 11.5–15.5)
WBC: 5.2 10*3/uL (ref 4.0–10.5)
nRBC: 0 % (ref 0.0–0.2)

## 2019-07-18 LAB — PHOSPHORUS: Phosphorus: 1.9 mg/dL — ABNORMAL LOW (ref 2.5–4.6)

## 2019-07-18 LAB — MAGNESIUM: Magnesium: 1.6 mg/dL — ABNORMAL LOW (ref 1.7–2.4)

## 2019-07-18 MED ORDER — SODIUM PHOSPHATES 45 MMOLE/15ML IV SOLN
40.0000 mmol | Freq: Once | INTRAVENOUS | Status: AC
  Administered 2019-07-18: 40 mmol via INTRAVENOUS
  Filled 2019-07-18: qty 13.33

## 2019-07-18 MED ORDER — MAGNESIUM SULFATE 50 % IJ SOLN
3.0000 g | Freq: Once | INTRAVENOUS | Status: AC
  Administered 2019-07-18: 3 g via INTRAVENOUS
  Filled 2019-07-18: qty 6

## 2019-07-18 NOTE — Progress Notes (Signed)
PROGRESS NOTE    Devon Peterson  [id number] DOB: October 02, 1955 DOA: 07/07/2019 PCP: Patient, No Pcp Per   Brief Narrative:  Devon Peterson a 64 y.o.BM PMHx COPD,Polysubstance abuse (EtOH, tobacco, Cocaine), hospital admission 04/17/2019-04/19/2019 for sepsis secondary to pneumonia and COPD exacerbation  Presenting to the ED via EMS for evaluation of cough, shortness of breath, fatigue, and unintentional weight loss.History provided by patient and his nephew at bedside. Nephew states patient was admitted to the hospital a month ago for pneumonia and since then has continued to do poorly. He has difficulty swallowing food and has been spitting up saliva. He is coughing a lot. Patient denies fevers, chills, shortness of breath, chest pain, nausea, vomiting, abdominal pain, diarrhea, or dysuria. Nephew states patient has been smoking a pack of cigarettes daily since a very young age. He has lost a lot of weight recently. Addendum: 07/07/2019 8:40 PM. Patient also denied history of hematemesis, hematochezia, or melena.  ED Course:Tachycardic on arrival with heart rate in the 150s, appeared to be sinus rhythm. Afebrile. Slightly tachypneic. Labs showing no leukocytosis. Initial lactic acid 2.5, repeat after IV fluid pending. Hemoglobin 12.7, stable compared to labs done in February 2021. Platelet count normal. BUN 27, creatinine 0.8. Albumin 3.3. LFTs normal. Blood culture x2 pending. SARS-CoV-2 PCR test negative. Influenza panel negative. UA not suggestive of infection. Urine culture pending. Chest x-ray showing new left lower lobe airspace opacity concerning for pneumonia. CT angiogram chest negative for PE. Showing abnormal soft tissue in the hypopharynx suspicious for underlying mass. Dense bilateral lower lobe consolidation, left greater than right. Aspiration suspected given findings in the hypopharynx. Also showing evidence of an enhancing mass within the densely consolidated  left lower lobe, metastatic disease or primary neoplasm not excluded. Marked cachexia.  Patient received ceftriaxone, azithromycin, and 2 L normal saline boluses.  **Interim History He was evaluated by ENT who recommended a tracheostomy.  Tracheostomy was done on 07/10/19 and postoperatively he ended up on pressors which were weaned off.  He is also getting a tissue biopsy and medical oncology as well as radiation oncology were consulted for further evaluation.  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 Postoperatively he had hypotension and went in A. fib with RVR and started on Neo-Synephrine drip has been stopped.  Critical care was consulted for further evaluation and his hypotension is improved.  We will continue IV fluid but patient needs long-term nutrition needs and needs a PEG tube and have called IR but they will reevaluate Monday for a PEG tube given the national shortage of PEG tube.  On 07/11/19 Patient's tracheostomy was doing okay but he was having a lot of secretions but not as much.  Again radiation oncology wants to have improvement in the patient's performance status as well as nutritional status as well as further evaluation of the lung mass with likely pet imaging as an outpatient and potential biopsy of lung lesion to determine if this is metastatic versus a second primary.  This will help determine radiation oncology if treatment will be of curative or more of a palliative approach.  They will continue to collaborate with medical oncology  On 07/12/19 he continued to have significant amount of secretions however they are becoming thicker we will try Mucomyst as well as Robinul to thin the secretions.  He denies any current pain at this time but was very weak and is severely deconditioned so PT OT was reconsulted again.   Subjective: 5/15 alert, nods yes and no to  questions.  Communicating with his nephew and son who are in room by writing at questions and answers.   Appears much more comfortable since trach change yesterday.  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 Wilburn Cornelia ENT placed tracheostomy -5/13 oncology sent PD-L1 testing on pathology. -5/13  Recommend a course of palliative or definitive radiation.  Radiation oncology consult has been completed.  Awaiting final recommendations. -5/13 oncology Will follow up on the lung mass and determine further work-up at a future date. - Outpatient follow-up at the cancer center -Continued thick secretions requiring frequent suctioning. -5/14Dr. [place] ENT upsized trach to #6 cuffless to help with secretions. -5/14 continue hypertonic saline nebulizer daily followed by albuterol nebulizer treatment - COPD -See aspiration pneumonia  Dysphagia -5/11 PEG tube placed -Osmolite 1.5 @ 20 ml/hr to advance by 10 ml every 12 hours to reach goal rate of 50 ml/hr -30 ml prostat once/day.  A. fib with RVR -Went into A. fib with RVR in the PACU and was given IV metoprolol -Heart rates have improved some but he is hypotensive and had to be placed on a Neo-Synephrine drip -We will not anticoagulate at this time but may need anticoagulation soon -5/12 currently NSR  Hypotension -Resolved  Severe protein calorie malnutrition/Underweight -See dysphagia   Generalized weakness/physical deconditioning: -PT and OT initial evaluation recommending Home Health PT but re-evaluation recommending SNF now  given his condition  EtOH abuse -Folic acid 1 mg daily -Thiamine 100 mg daily -Currently no signs or symptoms of withdrawal.  Cocaine abuse -5/5 positive cocaine -Avoid beta-blockers, although if absolutely required for control of HTN this far out would be safe  Tobacco abuse -NicoDerm patch -Counseled on need for absolute cessation  Hypoglycemia -In the setting of poor p.o. intake and was persistent -An outpatient on tube feeds should resolve -5/13 discontinue D10   -5/15 resolved  Hypokalemia -Potassium goal> 4  Hypomagnesmia -Magnesium goal> 2 -Magnesium 3 g  Hypophosphatemia -See hyponatremia  Hyponatremia Recent Labs  Lab 07/15/19 0352 07/16/19 0437 07/17/19 0348 07/17/19 1703 07/18/19 0432  NA 132* 121* 121* 121* 122*  -Normal saline 41m/hr -5/14 NaCl 2 g TID  -5/15 sodium phosphate IV 40 mmol  Normocytic Anemia -Patient's hemoglobin/hematocit is dropping slowly and went from 11.3/34.3 -> 10.0/29.5 -> 9.2/27.0 -Check anemia panel and showed an iron level of 30, U IBC 130, TIBC 150, saturation ratios of 19%, ferritin level 421, folate level 11.5, and vitamin B12 of 281 -Continue to monitor for signs and symptoms of bleeding; currently had a lot of bleeding from his trach site to be injected with lidocaine   Stage II sacral pressure ulcer (POA Pressure Injury 07/07/19 Sacrum Mid;Upper Stage 2 -  Partial thickness loss of dermis presenting as a shallow open injury with a red, pink wound bed without slough. 3 each small separate open areas (Active)  07/07/19 2107  Location: Sacrum  Location Orientation: Mid;Upper  Staging: Stage 2 -  Partial thickness loss of dermis  presenting as a shallow open injury with a red, pink wound bed without slough.  Wound Description (Comments): 3 each small separate open areas  Present on Admission: Yes  -Continue with Mepilex pads and frequent turning    Goals of care 5/12 palliative care consult; patient with  poorly differentiated squamous carcinoma, extremely cachectic now trach and PEG dependent.  Evaluate for change of CODE STATUS, consider hospice --PT OT recommending SNF.  Will await palliative care recommendations   DVT prophylaxis: SCD Code Status: Full Family Communication:  Disposition Plan:  Status is: Inpatient  Dispo: The patient is from: Home              Anticipated d/c is to: SNF?  Hospice?              Anticipated d/c date is: 07/22/2019              Patient currently unstable      Consultants:  PCCM Oncology Dr. Bertram Savin [facility]     Procedures/Significant Events:  Laryngeal cancer -07/08/2019 CT of the neck showed enhancing hypopharyngeal soft tissue suspicious for malignancy -07/09/2019 tracheostomy placement and biopsy             -07/09/2019 pathology consistent with poorly differentiated squamous cell carcinoma with basaloid features Left lower lobe of the lung mass concerning for primary neoplasm versus metastatic disease -07/07/2019 CT angiogram of the chest 2.7 cm mass in the left lower lobe of the lung 5/14 Dr. Redmond Baseman ENT upsized #6 cuffless trach>>>   I have personally reviewed and interpreted all radiology studies and my findings are as above.  VENTILATOR SETTINGS:    Cultures   Antimicrobials: Anti-infectives (From admission, onward)   Start     Dose/Rate Stop   07/07/19 2200  ampicillin-sulbactam (UNASYN) 1.5 g in sodium chloride 0.9 % 100 mL IVPB     1.5 g 200 mL/hr over 30 Minutes 07/14/19 1606   07/07/19 1930  metroNIDAZOLE (FLAGYL) IVPB 500 mg  Status:  Discontinued     500 mg 100 mL/hr over 60 Minutes 07/07/19 2003   07/07/19 1800  cefTRIAXone (ROCEPHIN) 1 g in sodium chloride 0.9 % 100 mL IVPB     1 g 200 mL/hr over 30 Minutes 07/07/19 1830   07/07/19 1800  azithromycin (ZITHROMAX) 500 mg in sodium chloride 0.9 % 250 mL IVPB     500 mg 250 mL/hr over 60 Minutes 07/07/19 1855        Devices    LINES / TUBES:  ENT upsized #6 cuffless trach 5/14>>>    Continuous Infusions: . sodium chloride 75 mL/hr at 07/17/19 2331  . feeding supplement (OSMOLITE 1.5 CAL) 1,000 mL (07/17/19 2333)     Objective: Vitals:   07/18/19 0400 07/18/19 0500 07/18/19 0600 07/18/19 0838  BP: (!) 158/97 137/85 136/85   Pulse: 84 87 83   Resp: 17 (!) 25 (!) 21   Temp:    97.7 F (36.5 C)  TempSrc:    Axillary  SpO2: 100% 100% 100%   Weight:      Height:        Intake/Output Summary (Last 24 hours) at 07/18/2019 0846 Last data filed at 07/18/2019 0354 Gross per 24 hour  Intake 589.83 ml  Output 1600 ml  Net -1010.17 ml   Filed Weights   07/07/19 2049 07/09/19 0949  Weight: 42 kg 42 kg   Physical Exam:  General: Alert, nods head yes and no to questions appropriately, positive acute  respiratory distress, cachectic Eyes: negative scleral hemorrhage, negative anisocoria, negative icterus ENT: Negative Runny nose, negative gingival bleeding, Neck:  Negative scars, masses, torticollis, lymphadenopathy, JVD Lungs: Diffuse rhonchi RIGHT >> LEFT  bilaterally without wheezes or crackles Cardiovascular: Regular rate and rhythm without murmur gallop or rub normal S1 and S2 Abdomen: negative abdominal pain, nondistended, positive soft, bowel sounds, no rebound, no ascites, no appreciable mass, PEG tube in place covered and clean negative sign of infection Extremities: No significant cyanosis, clubbing, or edema bilateral lower extremities Skin: Negative rashes, lesions, ulcers Psychiatric:  Negative depression, negative anxiety, negative fatigue, negative mania  Central nervous system:  Cranial nerves II through XII intact, tongue/uvula midline, all extremities muscle strength 5/5, sensation intact throughout, negative dysarthria, negative expressive aphasia, negative receptive aphasia.   .     Data Reviewed: Care during the described time interval was provided by me .  I have  reviewed this patient's available data, including medical history, events of note, physical examination, and all test results as part of my evaluation.   CBC: Recent Labs  Lab 07/14/19 0240 07/15/19 0352 07/16/19 0437 07/17/19 0348 07/18/19 0432  WBC 4.3 4.7 4.7 5.6 5.2  NEUTROABS 2.6 3.1 2.8 3.7 3.5  HGB 9.2* 9.5* 9.7* 9.4* 8.8*  HCT 27.0* 28.4* 27.7* 27.5* 25.6*  MCV 83.3 84.5 82.2 80.6 81.8  PLT 227 281 297 324 094   Basic Metabolic Panel: Recent Labs  Lab 07/14/19 0240 07/14/19 0240 07/15/19 0352 07/16/19 0437 07/17/19 0348 07/17/19 1703 07/18/19 0432  NA 133*   < > 132* 121* 121* 121* 122*  K 3.4*   < > 3.2* 3.7 4.0 3.9 4.3  CL 98   < > 98 91* 90* 89* 91*  CO2 26   < > '27 23 23 23 22  '$ GLUCOSE 106*   < > 128* 134* 104* 131* 98  BUN <5*   < > <5* <5* '8 11 10  '$ CREATININE 0.46*   < > 0.47* 0.45* 0.53* 0.46* 0.44*  CALCIUM 7.9*   < > 8.2* 7.8* 7.6* 7.4* 7.4*  MG 1.9  --  1.6* 1.5* 2.0  --  1.6*  PHOS 3.0  --  2.3* 2.3* 3.1  --  1.9*   < > = values in this interval not displayed.   GFR: Estimated Creatinine Clearance: 55.4 mL/min (A) (by C-G formula based on SCr of 0.44 mg/dL (L)). Liver Function Tests: Recent Labs  Lab 07/14/19 0240 07/15/19 0352 07/16/19 0437 07/17/19 0348 07/18/19 0432  AST 12* '15 16 19 26  '$ ALT '9 9 9 11 10  '$ ALKPHOS 51 47 54 57 55  BILITOT 0.4 0.3 0.6 0.5 0.5  PROT 5.2* 5.5* 5.5* 5.7* 5.4*  ALBUMIN 2.0* 2.2* 2.1* 2.2* 2.1*   No results for input(s): LIPASE, AMYLASE in the last 168 hours. No results for input(s): AMMONIA in the last 168 hours. Coagulation Profile: Recent Labs  Lab 07/13/19 0455  INR 1.1   Cardiac Enzymes: No results for input(s): CKTOTAL, CKMB, CKMBINDEX, TROPONINI in the last 168 hours. BNP (last 3 results) No results for input(s): PROBNP in the last 8760 hours. HbA1C: No results for input(s): HGBA1C in the last 72 hours. CBG: Recent Labs  Lab 07/17/19 1539 07/17/19 1957 07/17/19 2350 07/18/19 0336  07/18/19 0807  GLUCAP 148* 136* 106* 141* 114*   Lipid Profile: No results for input(s): CHOL, HDL, LDLCALC, TRIG, CHOLHDL, LDLDIRECT in the last 72 hours. Thyroid Function Tests: No results for input(s): TSH, T4TOTAL, FREET4,  T3FREE, THYROIDAB in the last 72 hours. Anemia Panel: No results for input(s): VITAMINB12, FOLATE, FERRITIN, TIBC, IRON, RETICCTPCT in the last 72 hours. Urine analysis:    Component Value Date/Time   COLORURINE YELLOW 07/07/2019 1729   APPEARANCEUR HAZY (A) 07/07/2019 1729   LABSPEC 1.016 07/07/2019 1729   PHURINE 5.0 07/07/2019 1729   GLUCOSEU NEGATIVE 07/07/2019 1729   HGBUR NEGATIVE 07/07/2019 1729   BILIRUBINUR NEGATIVE 07/07/2019 1729   KETONESUR NEGATIVE 07/07/2019 1729   PROTEINUR 30 (A) 07/07/2019 1729   UROBILINOGEN 1.0 10/28/2008 0458   NITRITE NEGATIVE 07/07/2019 1729   LEUKOCYTESUR NEGATIVE 07/07/2019 1729   Sepsis Labs: '@LABRCNTIP'$ (procalcitonin:4,lacticidven:4)  ) Recent Results (from the past 240 hour(s))  MRSA PCR Screening     Status: None   Collection Time: 07/09/19  4:35 PM   Specimen: Nasopharyngeal  Result Value Ref Range Status   MRSA by PCR NEGATIVE NEGATIVE Final    Comment:        The GeneXpert MRSA Assay (FDA approved for NASAL specimens only), is one component of a comprehensive MRSA colonization surveillance program. It is not intended to diagnose MRSA infection nor to guide or monitor treatment for MRSA infections. Performed at [facility], [place] [address] [place], [place] [address]          Radiology Studies: No results found.      Scheduled Meds: . albuterol  2.5 mg Nebulization Daily  . chlorhexidine  15 mL Mouth Rinse BID  . Chlorhexidine Gluconate Cloth  6 each Topical Daily  . feeding supplement (PRO-STAT SUGAR FREE 64)  30 mL Per Tube Daily  . glycopyrrolate  0.1 mg Intravenous BID  . mouth rinse  15 mL Mouth Rinse BID  . mouth rinse  15 mL Mouth Rinse q12n4p  . sodium  chloride  2 g Per Tube TID WC  . thiamine  100 mg Intravenous Daily   Continuous Infusions: . sodium chloride 75 mL/hr at 07/17/19 2331  . feeding supplement (OSMOLITE 1.5 CAL) 1,000 mL (07/17/19 2333)     LOS: 11 days   The patient is critically ill with multiple organ systems failure and requires high complexity decision making for assessment and support, frequent evaluation and titration of therapies, application of advanced monitoring technologies and extensive interpretation of multiple databases. Critical Care Time devoted to patient care services described in this note  Time spent: 40 minutes     Aldahir Litaker, Geraldo Docker, MD [facility] Pager [phone]  If 7PM-7AM, please contact night-coverage [email or web address] Password [facility] 07/18/2019, 8:46 AM

## 2019-07-19 DIAGNOSIS — R0602 Shortness of breath: Secondary | ICD-10-CM

## 2019-07-19 DIAGNOSIS — R531 Weakness: Secondary | ICD-10-CM

## 2019-07-19 DIAGNOSIS — Z7189 Other specified counseling: Secondary | ICD-10-CM

## 2019-07-19 LAB — CBC WITH DIFFERENTIAL/PLATELET
Abs Immature Granulocytes: 0.02 10*3/uL (ref 0.00–0.07)
Basophils Absolute: 0 10*3/uL (ref 0.0–0.1)
Basophils Relative: 0 %
Eosinophils Absolute: 0 10*3/uL (ref 0.0–0.5)
Eosinophils Relative: 0 %
HCT: 26.4 % — ABNORMAL LOW (ref 39.0–52.0)
Hemoglobin: 8.9 g/dL — ABNORMAL LOW (ref 13.0–17.0)
Immature Granulocytes: 0 %
Lymphocytes Relative: 18 %
Lymphs Abs: 0.9 10*3/uL (ref 0.7–4.0)
MCH: 28.1 pg (ref 26.0–34.0)
MCHC: 33.7 g/dL (ref 30.0–36.0)
MCV: 83.3 fL (ref 80.0–100.0)
Monocytes Absolute: 0.6 10*3/uL (ref 0.1–1.0)
Monocytes Relative: 11 %
Neutro Abs: 3.6 10*3/uL (ref 1.7–7.7)
Neutrophils Relative %: 71 %
Platelets: 353 10*3/uL (ref 150–400)
RBC: 3.17 MIL/uL — ABNORMAL LOW (ref 4.22–5.81)
RDW: 12.5 % (ref 11.5–15.5)
WBC: 5.1 10*3/uL (ref 4.0–10.5)
nRBC: 0 % (ref 0.0–0.2)

## 2019-07-19 LAB — COMPREHENSIVE METABOLIC PANEL
ALT: 14 U/L (ref 0–44)
AST: 27 U/L (ref 15–41)
Albumin: 2 g/dL — ABNORMAL LOW (ref 3.5–5.0)
Alkaline Phosphatase: 61 U/L (ref 38–126)
Anion gap: 6 (ref 5–15)
BUN: 10 mg/dL (ref 8–23)
CO2: 29 mmol/L (ref 22–32)
Calcium: 7.7 mg/dL — ABNORMAL LOW (ref 8.9–10.3)
Chloride: 98 mmol/L (ref 98–111)
Creatinine, Ser: 0.44 mg/dL — ABNORMAL LOW (ref 0.61–1.24)
GFR calc Af Amer: >60 mL/min (ref >60)
GFR calc non Af Amer: >60 mL/min (ref >60)
Glucose, Bld: 95 mg/dL (ref 70–99)
Potassium: 3.8 mmol/L (ref 3.5–5.1)
Sodium: 133 mmol/L — ABNORMAL LOW (ref 135–145)
Total Bilirubin: 0.2 mg/dL — ABNORMAL LOW (ref 0.3–1.2)
Total Protein: 5.2 g/dL — ABNORMAL LOW (ref 6.5–8.1)

## 2019-07-19 LAB — GLUCOSE, CAPILLARY
Glucose-Capillary: 24 mg/dL — CL (ref 70–99)
Glucose-Capillary: 76 mg/dL (ref 70–99)
Glucose-Capillary: 80 mg/dL (ref 70–99)
Glucose-Capillary: 86 mg/dL (ref 70–99)
Glucose-Capillary: 89 mg/dL (ref 70–99)
Glucose-Capillary: 97 mg/dL (ref 70–99)
Glucose-Capillary: 99 mg/dL (ref 70–99)

## 2019-07-19 LAB — MAGNESIUM: Magnesium: 1.8 mg/dL (ref 1.7–2.4)

## 2019-07-19 LAB — PHOSPHORUS: Phosphorus: 2.1 mg/dL — ABNORMAL LOW (ref 2.5–4.6)

## 2019-07-19 MED ORDER — SODIUM PHOSPHATES 45 MMOLE/15ML IV SOLN
30.0000 mmol | Freq: Once | INTRAVENOUS | Status: AC
  Administered 2019-07-19: 30 mmol via INTRAVENOUS
  Filled 2019-07-19: qty 10

## 2019-07-19 NOTE — Progress Notes (Signed)
PROGRESS NOTE    Devon Peterson  MRN:[id number] DOB: 01/25/1956 DOA: 07/07/2019 PCP: Patient, No Pcp Per   Brief Narrative:  Devon Petersonis a 64 y.o.BM PMHx COPD,Polysubstance abuse (EtOH, tobacco, Cocaine), hospital admission 04/17/2019-04/19/2019 for sepsis secondary to pneumonia and COPD exacerbation  Presenting to the ED via EMS for evaluation of cough, shortness of breath, fatigue, and unintentional weight loss.History provided by patient and his nephew at bedside. Nephew states patient was admitted to the hospital a month ago for pneumonia and since then has continued to do poorly. He has difficulty swallowing food and has been spitting up saliva. He is coughing a lot. Patient denies fevers, chills, shortness of breath, chest pain, nausea, vomiting, abdominal pain, diarrhea, or dysuria. Nephew states patient has been smoking a pack of cigarettes daily since a very young age. He has lost a lot of weight recently. Addendum: 07/07/2019 8:40 PM. Patient also denied history of hematemesis, hematochezia, or melena.  ED Course:Tachycardic on arrival with heart rate in the 150s, appeared to be sinus rhythm. Afebrile. Slightly tachypneic. Labs showing no leukocytosis. Initial lactic acid 2.5, repeat after IV fluid pending. Hemoglobin 12.7, stable compared to labs done in February 2021. Platelet count normal. BUN 27, creatinine 0.8. Albumin 3.3. LFTs normal. Blood culture x2 pending. SARS-CoV-2 PCR test negative. Influenza panel negative. UA not suggestive of infection. Urine culture pending. Chest x-ray showing new left lower lobe airspace opacity concerning for pneumonia. CT angiogram chest negative for PE. Showing abnormal soft tissue in the hypopharynx suspicious for underlying mass. Dense bilateral lower lobe consolidation, left greater than right. Aspiration suspected given findings in the hypopharynx. Also showing evidence of an enhancing mass within the densely consolidated  left lower lobe, metastatic disease or primary neoplasm not excluded. Marked cachexia.  Patient received ceftriaxone, azithromycin, and 2 L normal saline boluses.  **Interim History He was evaluated by ENT who recommended a tracheostomy.  Tracheostomy was done on 07/10/19 and postoperatively he ended up on pressors which were weaned off.  He is also getting a tissue biopsy and medical oncology as well as radiation oncology were consulted for further evaluation.  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 Postoperatively he had hypotension and went in A. fib with RVR and started on Neo-Synephrine drip has been stopped.  Critical care was consulted for further evaluation and his hypotension is improved.  We will continue IV fluid but patient needs long-term nutrition needs and needs a PEG tube and have called IR but they will reevaluate Monday for a PEG tube given the national shortage of PEG tube.  On 07/11/19 Patient's tracheostomy was doing okay but he was having a lot of secretions but not as much.  Again radiation oncology wants to have improvement in the patient's performance status as well as nutritional status as well as further evaluation of the lung mass with likely pet imaging as an outpatient and potential biopsy of lung lesion to determine if this is metastatic versus a second primary.  This will help determine radiation oncology if treatment will be of curative or more of a palliative approach.  They will continue to collaborate with medical oncology  On 07/12/19 he continued to have significant amount of secretions however they are becoming thicker we will try Mucomyst as well as Robinul to thin the secretions.  He denies any current pain at this time but was very weak and is severely deconditioned so PT OT was reconsulted again.   Subjective: 5/16 patient alert nods yes and no   to questions.  Interacts with all of his family members were present for family meeting.  Negative  CP, negative S OB, negative abdominal pain.  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 Wilburn [facility] ENT placed tracheostomy -5/13 oncology sent PD-L1 testing on pathology. -5/13  Recommend a course of palliative or definitive radiation.  Radiation oncology consult has been completed.  Awaiting final recommendations. -5/13 oncology Will follow up on the lung mass and determine further work-up at a future date. - Outpatient follow-up at the cancer center -Continued thick secretions requiring frequent suctioning. -5/14Dr. [place] ENT upsized trach to #6 cuffless to help with secretions. -5/14 continue hypertonic saline nebulizer daily followed by albuterol nebulizer treatment  LEFT lung mass -Suspicious for neoplasm either second primary or metastatic.   - COPD -See aspiration pneumonia  Dysphagia -5/11 PEG tube placed -Osmolite 1.5 @ 20 ml/hr to advance by 10 ml every 12 hours to reach goal rate of 50 ml/hr -30 ml prostat once/day.  A. fib with RVR -Went into A. fib with RVR in the PACU and was given IV metoprolol -Heart rates have improved some but he is hypotensive and had to be placed on a Neo-Synephrine drip -We will not anticoagulate at this time but may need anticoagulation soon -5/16 currently NSR  Hypotension -Resolved  Severe protein calorie malnutrition/Underweight -See dysphagia   Generalized weakness/physical deconditioning: -PT and OT initial evaluation  recommending Home Health PT but re-evaluation recommending SNF now given his condition  EtOH abuse -Folic acid 1 mg daily -Thiamine 100 mg daily -Currently no signs or symptoms of withdrawal.  Cocaine abuse -5/5 positive cocaine -Avoid beta-blockers, although if absolutely required for control of HTN this far out would be safe  Tobacco abuse -NicoDerm patch -Counseled on need for absolute cessation  Hypoglycemia -In the setting of poor p.o. intake and was persistent -An outpatient on tube feeds should resolve -5/13 discontinue D10   -5/15 resolved  Hypokalemia -Potassium goal> 4  Hypomagnesmia -Magnesium goal> 2 -Magnesium 3 g  Hypophosphatemia -See hyponatremia  Hyponatremia Recent Labs  Lab 07/16/19 0437 07/17/19 0348 07/17/19 1703 07/18/19 0432 07/19/19 0445  NA 121* 121* 121* 122* 133*  -Normal saline 69m/hr -5/16 NaCl 2 g TID (hold) -5/16 sodium phosphate IV 30 mmol  Normocytic Anemia -Patient's hemoglobin/hematocit is dropping slowly and went from 11.3/34.3 -> 10.0/29.5 -> 9.2/27.0 -Check anemia panel and showed an iron level of 30, U IBC 130, TIBC 150, saturation ratios of 19%, ferritin level 421, folate level 11.5, and vitamin B12 of 281 -Continue to monitor for signs and symptoms of bleeding; currently had a lot of bleeding from his trach site to be injected with lidocaine   Stage II sacral pressure ulcer (POA Pressure Injury 07/07/19 Sacrum Mid;Upper Stage 2 -  Partial thickness loss of dermis presenting as a shallow open injury with a red, pink wound bed without slough. 3 each small separate open areas (Active)  07/07/19 2107  Location: Sacrum  Location Orientation: Mid;Upper  Staging: Stage 2 -  Partial thickness loss of dermis presenting as a shallow open injury with a red, pink wound bed without slough.  Wound Description (Comments): 3 each small separate open areas  Present on Admission: Yes  -Continue with Mepilex pads and frequent  turning    Goals of care 5/13 palliative care consult; patient with poorly differentiated squamous carcinoma, extremely cachectic now trach and PEG dependent.  Evaluate for change of CODE STATUS, consider hospice --PT OT recommending SNF.  Will await palliative care recommendations   DVT prophylaxis: SCD Code Status: Full Family Communication: 5/16 whole family present for discussion of plan of care.  Dr. Anwar from palliative care also present.  Per Dr. Anwar's note family decided on the following; Patient and family to discuss about no CPR/DNR status. Will possibly consider skilled nursing facility rehabilitation attempt with continuation of tube feedings and radiation treatments as well.  Follow-up with medical oncology in the outpatient setting.  Disposition Plan:  Status is: Inpatient  Dispo: The patient is from: Home              Anticipated d/c is to: SNF?  Hospice?              Anticipated d/c date is: 07/22/2019              Patient currently unstable      Consultants:  PCCM Oncology Dr. Bates [facility]     Procedures/Significant Events:  5/4 CTA chest W contrast;-negative PE. - Abnormal soft tissue in the hypopharynx, suspicious for underlying mass. CT neck or direct visual inspection recommended for further evaluation. - Dense bilateral lower lobe consolidation, LEFT >> RIGHT . Aspiration is suspected given findings in the hypopharynx. -Evidence of an enhancing mass within the densely consolidated left lower lobe, metastatic disease or primary neoplasm not excluded.  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 Bates ENT upsized #6 cuffless trach>>>   I have personally reviewed and interpreted all radiology studies and my findings are as above.  VENTILATOR SETTINGS:    Cultures   Antimicrobials: Anti-infectives (From admission, onward)   Start     Dose/Rate Stop   07/07/19 2200  ampicillin-sulbactam (UNASYN) 1.5 g in sodium chloride 0.9 % 100 mL IVPB     1.5 g 200 mL/hr over 30 Minutes 07/14/19 1606   07/07/19 1930  metroNIDAZOLE (FLAGYL) IVPB 500 mg  Status:  Discontinued     500 mg 100 mL/hr over 60 Minutes 07/07/19 2003   07/07/19 1800  cefTRIAXone (ROCEPHIN) 1 g in sodium chloride 0.9 % 100 mL IVPB     1 g 200 mL/hr over 30 Minutes 07/07/19 1830   07/07/19 1800  azithromycin (ZITHROMAX) 500 mg in sodium chloride 0.9 % 250 mL IVPB     500 mg 250 mL/hr over 60 Minutes 07/07/19 1855       Devices    LINES / TUBES:  ENT upsized #6 cuffless trach 5/14>>>    Continuous Infusions: . sodium chloride 75 mL/hr at 07/19/19 0416  . feeding supplement (OSMOLITE 1.5 CAL) 1,000   mL (07/18/19 2221)     Objective: Vitals:   07/19/19 0500 07/19/19 0600 07/19/19 0758 07/19/19 0800  BP: 127/75 116/73 119/75 120/76  Pulse: 77 76 82 86  Resp: 16 16 20 18  Temp:      TempSrc:      SpO2: 100% 99% 99% 99%  Weight:      Height:        Intake/Output Summary (Last 24 hours) at 07/19/2019 0906 Last data filed at 07/19/2019 0600 Gross per 24 hour  Intake 4045.03 ml  Output 5050 ml  Net -1004.97 ml   Filed Weights   07/07/19 2049 07/09/19 0949  Weight: 42 kg 42 kg   Physical Exam:  General: Alert, nods head yes and no to questions appropriately, positive acute respiratory distress, cachectic Eyes: negative scleral hemorrhage, negative anisocoria, negative icterus ENT: Negative Runny nose, negative gingival bleeding, Neck:  Negative scars, masses, torticollis,  lymphadenopathy, JVD Lungs: Diffuse rhonchi RIGHT >> LEFT  bilaterally without wheezes or crackles Cardiovascular: Regular rate and rhythm without murmur gallop or rub normal S1 and S2 Abdomen: negative abdominal pain, nondistended, positive soft, bowel sounds, no rebound, no ascites, no appreciable mass, PEG tube in place covered and clean negative sign of infection Extremities: No significant cyanosis, clubbing, or edema bilateral lower extremities Skin: Negative rashes, lesions, ulcers Psychiatric:  Negative depression, negative anxiety, negative fatigue, negative mania  Central nervous system:  Cranial nerves II through XII intact, tongue/uvula midline, all extremities muscle strength 5/5, sensation intact throughout, negative dysarthria, negative expressive aphasia, negative receptive aphasia.   .     Data Reviewed: Care during the described time interval was provided by me .  I have reviewed this patient's available data, including medical history, events of note, physical examination, and all test results as part of my evaluation.   CBC: Recent Labs  Lab 07/15/19 0352 07/16/19 0437 07/17/19 0348 07/18/19 0432 07/19/19 0445  WBC 4.7 4.7 5.6 5.2 5.1  NEUTROABS 3.1 2.8 3.7 3.5 3.6  HGB 9.5* 9.7* 9.4* 8.8* 8.9*  HCT 28.4* 27.7* 27.5* 25.6* 26.4*  MCV 84.5 82.2 80.6 81.8 83.3  PLT 281 297 324 323 353   Basic Metabolic Panel: Recent Labs  Lab 07/15/19 0352 07/15/19 0352 07/16/19 0437 07/17/19 0348 07/17/19 1703 07/18/19 0432 07/19/19 0445  NA 132*   < > 121* 121* 121* 122* 133*  K 3.2*   < > 3.7 4.0 3.9 4.3 3.8  CL 98   < > 91* 90* 89* 91* 98  CO2 27   < > 23 23 23 22 29  GLUCOSE 128*   < > 134* 104* 131* 98 95  BUN <5*   < > <5* 8 11 10 10  CREATININE 0.47*   < > 0.45* 0.53* 0.46* 0.44* 0.44*  CALCIUM 8.2*   < > 7.8* 7.6* 7.4* 7.4* 7.7*  MG 1.6*  --  1.5* 2.0  --  1.6* 1.8  PHOS 2.3*  --  2.3* 3.1  --  1.9* 2.1*   < > = values in this interval not displayed.    GFR: Estimated Creatinine Clearance: 55.4 mL/min (A) (by C-G formula based on SCr of 0.44 mg/dL (L)). Liver Function Tests: Recent Labs  Lab 07/15/19 0352 07/16/19 0437 07/17/19 0348 07/18/19 0432 07/19/19 0445  AST 15 16 19 26 27  ALT 9 9 11 10 14  ALKPHOS 47 54 57 55 61  BILITOT 0.3 0.6 0.5 0.5 0.2*  PROT 5.5* 5.5* 5.7* 5.4* 5.2*    ALBUMIN 2.2* 2.1* 2.2* 2.1* 2.0*   No results for input(s): LIPASE, AMYLASE in the last 168 hours. No results for input(s): AMMONIA in the last 168 hours. Coagulation Profile: Recent Labs  Lab 07/13/19 0455  INR 1.1   Cardiac Enzymes: No results for input(s): CKTOTAL, CKMB, CKMBINDEX, TROPONINI in the last 168 hours. BNP (last 3 results) No results for input(s): PROBNP in the last 8760 hours. HbA1C: No results for input(s): HGBA1C in the last 72 hours. CBG: Recent Labs  Lab 07/18/19 1701 07/18/19 2023 07/18/19 2320 07/19/19 0410 07/19/19 0843  GLUCAP 82 95 88 89 86   Lipid Profile: No results for input(s): CHOL, HDL, LDLCALC, TRIG, CHOLHDL, LDLDIRECT in the last 72 hours. Thyroid Function Tests: No results for input(s): TSH, T4TOTAL, FREET4, T3FREE, THYROIDAB in the last 72 hours. Anemia Panel: No results for input(s): VITAMINB12, FOLATE, FERRITIN, TIBC, IRON, RETICCTPCT in the last 72 hours. Urine analysis:    Component Value Date/Time   COLORURINE YELLOW 07/07/2019 1729   APPEARANCEUR HAZY (A) 07/07/2019 1729   LABSPEC 1.016 07/07/2019 1729   PHURINE 5.0 07/07/2019 1729   GLUCOSEU NEGATIVE 07/07/2019 1729   HGBUR NEGATIVE 07/07/2019 1729   BILIRUBINUR NEGATIVE 07/07/2019 1729   KETONESUR NEGATIVE 07/07/2019 1729   PROTEINUR 30 (A) 07/07/2019 1729   UROBILINOGEN 1.0 10/28/2008 0458   NITRITE NEGATIVE 07/07/2019 1729   LEUKOCYTESUR NEGATIVE 07/07/2019 1729   Sepsis Labs: @LABRCNTIP(procalcitonin:4,lacticidven:4)  ) Recent Results (from the past 240 hour(s))  MRSA PCR Screening     Status: None   Collection Time:  07/09/19  4:35 PM   Specimen: Nasopharyngeal  Result Value Ref Range Status   MRSA by PCR NEGATIVE NEGATIVE Final    Comment:        The GeneXpert MRSA Assay (FDA approved for NASAL specimens only), is one component of a comprehensive MRSA colonization surveillance program. It is not intended to diagnose MRSA infection nor to guide or monitor treatment for MRSA infections. Performed at [facility], [address] [place], [place] [address]          Radiology Studies: No results found.      Scheduled Meds: . albuterol  2.5 mg Nebulization Daily  . chlorhexidine  15 mL Mouth Rinse BID  . Chlorhexidine Gluconate Cloth  6 each Topical Daily  . feeding supplement (PRO-STAT SUGAR FREE 64)  30 mL Per Tube Daily  . glycopyrrolate  0.1 mg Intravenous BID  . mouth rinse  15 mL Mouth Rinse BID  . mouth rinse  15 mL Mouth Rinse q12n4p  . sodium chloride  2 g Per Tube TID WC  . thiamine  100 mg Intravenous Daily   Continuous Infusions: . sodium chloride 75 mL/hr at 07/19/19 0416  . feeding supplement (OSMOLITE 1.5 CAL) 1,000 mL (07/18/19 2221)     LOS: 12 days   The patient is critically ill with multiple organ systems failure and requires high complexity decision making for assessment and support, frequent evaluation and titration of therapies, application of advanced monitoring technologies and extensive interpretation of multiple databases. Critical Care Time devoted to patient care services described in this note  Time spent: 40 minutes     WOODS, CURTIS J, MD [facility] Pager [phone]  If 7PM-7AM, please contact night-coverage [email or web address] Password [email or web address] 07/19/2019, 9:06 AM      

## 2019-07-19 NOTE — Progress Notes (Signed)
Daily Progress Note   Patient Name: Devon Peterson       Date: 07/19/2019 DOB: 1955-08-19  Age: 64 y.o. MRN#: [id number] Attending Physician: Allie Bossier, MD Primary Care Physician: Patient, No Pcp Per Admit Date: 07/07/2019  Reason for Consultation/Follow-up: Establishing goals of care  Subjective: Mr. Manfredo is awake and alert resting in bed.  He appears frail and cachectic.  He has tracheostomy and he has PEG tube with tube feedings ongoing.  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  He is able to write to communicate.  A family meeting was held alongside of Dr. Sherral Hammers [facility] MD at the patient's bedside.  Patient participated as well.  See below.  Length of Stay: 12  Current Medications: Scheduled Meds:  . albuterol  2.5 mg Nebulization Daily  . chlorhexidine  15 mL Mouth Rinse BID  . Chlorhexidine Gluconate Cloth  6 each Topical Daily  . feeding supplement (PRO-STAT SUGAR FREE 64)  30 mL Per Tube Daily  . glycopyrrolate  0.1 mg Intravenous BID  . mouth rinse  15 mL Mouth Rinse BID  . mouth rinse  15 mL Mouth Rinse q12n4p  . thiamine  100 mg Intravenous Daily    Continuous Infusions: . sodium chloride 75 mL/hr at 07/19/19 1200  . feeding supplement (OSMOLITE 1.5 CAL) 20 mL/hr at 07/19/19 1000  . sodium phosphate  Dextrose 5% IVPB 43 mL/hr at 07/19/19 1200    PRN Meds: ipratropium-albuterol, labetalol, ondansetron (ZOFRAN) IV, sodium chloride flush  Physical Exam         Awake alert resting in bed Appears frail and cachectic has trach has PEG Coarse breath sounds PEG site clean No edema but the patient has significant clubbing and missing digits upper extremity Muscle wasting evident S1-S2  Vital Signs: BP 140/86   Pulse 93   Temp  98.2 F (36.8 C) (Oral)   Resp 16   Ht 5\' 11"  (1.803 m)   Wt 42 kg   SpO2 100%   BMI 12.91 kg/m  SpO2: SpO2: 100 % O2 Device: O2 Device: Tracheostomy Collar O2 Flow Rate: O2 Flow Rate (L/min): 5 L/min  Intake/output summary:   Intake/Output Summary (Last 24 hours) at 07/19/2019 1214 Last data filed at 07/19/2019 1200 Gross per 24 hour  Intake 4646.49 ml  Output 5050 ml  Net -403.51 ml   LBM: Last BM Date: 07/12/19 Baseline Weight: Weight: 42 kg Most recent weight: Weight: 42 kg       Palliative Assessment/Data:      Patient Active Problem List   Diagnosis Date Noted  . Squamous cell carcinoma of neck   . Palliative care encounter   . Tracheostomy care ([place])   . 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 Lung mass   . Neck mass   . Pressure injury of skin 07/08/2019  . Aspiration pneumonia ([place]) 07/07/2019  . Dysphagia 07/07/2019  . Severe protein-calorie malnutrition ([place]) 07/07/2019  . Alcohol use 07/07/2019  . Sepsis ([place]) 04/17/2019  . COPD exacerbation ([place])   . Lactic acidosis   . Tobacco abuse   . 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 Frailty deconditioning and compromised functional status.  Recommendations/Plan:  A family meeting was held at the patient's bedside with the patient participating.  This was attended by the undersigned as well as Dr. Sherral Hammers.  Patient's mother sisters son Legrand Como and several other family members were in attendance. Palliative medicine is specialized medical care for people living with serious illness. It focuses on providing relief from the symptoms and stress of a serious illness. The  goal is to improve quality of life for both the patient and the family.  Goals of care: Broad aims of medical therapy in relation to the patient's values and preferences. Our aim is to provide medical care aimed at enabling patients to achieve the goals that matter most to them, given the circumstances of their particular medical situation and their constraints.   Goals wishes and values important to the patient and family as a unit attempted to be explored.  Brief life review performed.  We discussed extensively about the patient's current hospitalization as well as diagnosis of serious illness.  We talked about potential work-up for lung lesion, ongoing work-up and treatment for current diagnosis of poorly differentiated squamous cell carcinoma neck mass.  Patient is to be evaluated by radiation oncology soon.  We discussed about goals of care we discussed about CODE STATUS we discussed about appropriate disposition options.  Differences between hospice/palliative were discussed.  Plan: Remains full code for now.  Patient and family to discuss about no CPR/DNR status. Will possibly consider skilled nursing facility rehabilitation attempt with continuation of tube feedings and radiation treatments as well.  Follow-up with medical oncology in the outpatient setting.   Goals of Care and Additional Recommendations:  Limitations on Scope of Treatment: Full Scope Treatment  Code Status:    Code Status Orders  (From admission, onward)         Start     Ordered   07/07/19 1957  Full code  Continuous     07/07/19 2003        Code Status History    Date Active Date Inactive Code Status Order ID Comments User Context   04/17/2019 1638 04/19/2019 1957 Full Code [id number]  Loletha Grayer, MD ED   Advance Care Planning Activity       Prognosis:   Unable to determine  Discharge Planning:  To  Be Determined Likely for skilled nursing facility rehabilitation attempt with palliative  services following.  Will also recommend patient following up with palliative services at the cancer center. Care plan was discussed with patient, sisters, son Legrand Como and a few other extended family members.   Thank you for allowing the Palliative Medicine Team to assist in the care of this patient.   Time In: 10 Time Out: 10.40 Total Time 40 Prolonged Time Billed  no       Greater than 50%  of this time was spent counseling and coordinating care related to the above assessment and plan.  Loistine Chance, MD  Please contact Palliative Medicine Team phone at [phone] for questions and concerns.

## 2019-07-20 ENCOUNTER — Ambulatory Visit
Admit: 2019-07-20 | Discharge: 2019-07-20 | Disposition: A | Discharge status: Home / Self Care | Payer: Medicaid Other | Attending: Radiation Oncology | Admitting: Radiation Oncology

## 2019-07-20 ENCOUNTER — Ambulatory Visit: Payer: Medicaid Other

## 2019-07-20 DIAGNOSIS — C139 Malignant neoplasm of hypopharynx, unspecified: Secondary | ICD-10-CM

## 2019-07-20 LAB — CBC WITH DIFFERENTIAL/PLATELET
Abs Immature Granulocytes: 0.01 10*3/uL (ref 0.00–0.07)
Basophils Absolute: 0 10*3/uL (ref 0.0–0.1)
Basophils Relative: 0 %
Eosinophils Absolute: 0 10*3/uL (ref 0.0–0.5)
Eosinophils Relative: 1 %
HCT: 25.8 % — ABNORMAL LOW (ref 39.0–52.0)
Hemoglobin: 8.5 g/dL — ABNORMAL LOW (ref 13.0–17.0)
Immature Granulocytes: 0 %
Lymphocytes Relative: 19 %
Lymphs Abs: 1.1 10*3/uL (ref 0.7–4.0)
MCH: 28 pg (ref 26.0–34.0)
MCHC: 32.9 g/dL (ref 30.0–36.0)
MCV: 84.9 fL (ref 80.0–100.0)
Monocytes Absolute: 0.6 10*3/uL (ref 0.1–1.0)
Monocytes Relative: 10 %
Neutro Abs: 4 10*3/uL (ref 1.7–7.7)
Neutrophils Relative %: 70 %
Platelets: 361 10*3/uL (ref 150–400)
RBC: 3.04 MIL/uL — ABNORMAL LOW (ref 4.22–5.81)
RDW: 13 % (ref 11.5–15.5)
WBC: 5.7 10*3/uL (ref 4.0–10.5)
nRBC: 0 % (ref 0.0–0.2)

## 2019-07-20 LAB — SARS CORONAVIRUS 2 (TAT 6-24 HRS): SARS Coronavirus 2: NEGATIVE

## 2019-07-20 LAB — GLUCOSE, CAPILLARY
Glucose-Capillary: 88 mg/dL (ref 70–99)
Glucose-Capillary: 73 mg/dL (ref 70–99)
Glucose-Capillary: 73 mg/dL (ref 70–99)
Glucose-Capillary: 93 mg/dL (ref 70–99)
Glucose-Capillary: 68 mg/dL — ABNORMAL LOW (ref 70–99)

## 2019-07-20 LAB — MAGNESIUM: Magnesium: 1.5 mg/dL — ABNORMAL LOW (ref 1.7–2.4)

## 2019-07-20 LAB — COMPREHENSIVE METABOLIC PANEL
ALT: 22 U/L (ref 0–44)
AST: 37 U/L (ref 15–41)
Albumin: 2.1 g/dL — ABNORMAL LOW (ref 3.5–5.0)
Alkaline Phosphatase: 65 U/L (ref 38–126)
Anion gap: 6 (ref 5–15)
BUN: 9 mg/dL (ref 8–23)
CO2: 30 mmol/L (ref 22–32)
Calcium: 7.9 mg/dL — ABNORMAL LOW (ref 8.9–10.3)
Chloride: 97 mmol/L — ABNORMAL LOW (ref 98–111)
Creatinine, Ser: 0.37 mg/dL — ABNORMAL LOW (ref 0.61–1.24)
GFR calc Af Amer: >60 mL/min (ref >60)
GFR calc non Af Amer: >60 mL/min (ref >60)
Glucose, Bld: 93 mg/dL (ref 70–99)
Potassium: 3.9 mmol/L (ref 3.5–5.1)
Sodium: 133 mmol/L — ABNORMAL LOW (ref 135–145)
Total Bilirubin: 0.3 mg/dL (ref 0.3–1.2)
Total Protein: 5.3 g/dL — ABNORMAL LOW (ref 6.5–8.1)

## 2019-07-20 LAB — PHOSPHORUS: Phosphorus: 2.3 mg/dL — ABNORMAL LOW (ref 2.5–4.6)

## 2019-07-20 MED ORDER — GLYCOPYRROLATE 1 MG PO TABS
1.0000 mg | ORAL_TABLET | Freq: Two times a day (BID) | ORAL | Status: DC
  Administered 2019-07-20 – 2019-07-27 (×14): 1 mg
  Filled 2019-07-20 (×15): qty 1

## 2019-07-20 MED ORDER — POTASSIUM PHOSPHATES 15 MMOLE/5ML IV SOLN
30.0000 mmol | Freq: Once | INTRAVENOUS | Status: AC
  Administered 2019-07-20: 30 mmol via INTRAVENOUS
  Filled 2019-07-20: qty 10

## 2019-07-20 MED ORDER — SODIUM PHOSPHATES 45 MMOLE/15ML IV SOLN: 20.0000 mmol | Freq: Once | INTRAVENOUS | Status: DC

## 2019-07-20 MED ORDER — THIAMINE HCL 100 MG PO TABS
100.0000 mg | ORAL_TABLET | Freq: Every day | ORAL | Status: DC
  Administered 2019-07-21 – 2019-08-28 (×38): 100 mg
  Filled 2019-07-20 (×38): qty 1

## 2019-07-20 MED ORDER — MAGNESIUM SULFATE 2 GM/50ML IV SOLN
2.0000 g | Freq: Once | INTRAVENOUS | Status: AC
  Administered 2019-07-20: 2 g via INTRAVENOUS
  Filled 2019-07-20: qty 50

## 2019-07-20 NOTE — Progress Notes (Signed)
Oncology Nurse Navigator Documentation  Met with patient during initial consult with Dr. Isidore Moos.  He was accompanied by his son Legrand Como and his sister [name] was present over the phone.  . Introduced myself as his Navigator, explained my role as a member of the Care Team.   . Provided introductory explanation of radiation treatment including SIM planning and purpose of Aquaplast head and shoulder mask, showed them example.   . I encouraged them to contact me with questions/concerns as treatments/procedures begin. I provided Legrand Como with my card to allow contact if needed.  They verbalized understanding of information provided.    Harlow Asa RN, BSN, OCN Head & Neck Oncology Nurse [place] [facility] [place] [phone]

## 2019-07-20 NOTE — Progress Notes (Signed)
Nutrition Follow-up  DOCUMENTATION CODES:   Severe malnutrition in context of chronic illness, Underweight  INTERVENTION:  - continue Osmolite 1.5 @ 50 ml/hr with 30 ml prostat once/day. - will monitor for need for free water flush. * weigh patient today.   Monitor magnesium, potassium, and phosphorus daily for at least 3 days, MD to replete as needed, as pt is likely currently refeeding given severe malnutrition, mild hypophosphatemia and mild hypomagnesemia.   NUTRITION DIAGNOSIS:   Severe Malnutrition related to chronic illness(COPD) as evidenced by severe fat depletion, severe muscle depletion. -ongoing  GOAL:   Patient will meet greater than or equal to 90% of their needs -met with TF regimen  MONITOR:   TF tolerance, Labs, Weight trends, Skin  ASSESSMENT:   64 year old male with past medical history of COPD, polysubstance abuse, recent admission 2/12-2/14 for sepsis secondary to pneumonia and COPD exacerbation presented with SOB, fatigue, and unintentional wt loss. Family reports patient has done poorly s/p returning home from hospital, has difficulty swallowing food, spitting up saliva, and has chronic cough. CXR revealed new left lower lobe airspace opacity concerning for pneumonia, CT angiogram showed abnormal soft tissue in hypopharynx suspicious for underlying mass, and dense bilateral lower lobe consolidation.  Patient laying in bed with son at bedside. PEG placed by IR on 5/11 and patient is receiving Osmolite 1.5 @ 50 ml/hr with 30 ml prostat once/day. This regimen is providing 1900 kcal, 90 grams protein, and 914 ml free water.  He has not been weighed since 5/6.  Able to talk with RN who reports patient is tolerating TF without any noticeable issues.   Palliative Care met with patient and family yesterday. Patient remains Full Code at this time. Likely plan is for SNF at the time of discharge.     Labs reviewed; CBGs: 88 and 73 mg/dl, Na: 133 mmol/l, Cl: 97  mmol/l, creatinine: 0.37 mg/dl, Ca: 7.9 mg/dl, Phos: 2.3 mg/dl, Mg: 1.5 mg/dl, K WDL.  Medications reviewed; 2 g IV Mg sulfate x1 run 5/17, 30 mmol IV KPhos x1 run 5/17, 30 mmol IV NaPhos x1 run 5/16, 100 mg IV thiamine/day.  IVF; NS @ 75 ml/hr.   Diet Order:   Diet Order            Diet NPO time specified  Diet effective now              EDUCATION NEEDS:   No education needs have been identified at this time  Skin:  Skin Assessment: Skin Integrity Issues: Skin Integrity Issues:: Stage II, Incisions Stage II: mid sacrum Incisions: neck for trach (5/6)  Last BM:  5/11  Height:   Ht Readings from Last 1 Encounters:  07/09/19 _0  (1.803 m)    Weight:   Wt Readings from Last 1 Encounters:  07/09/19 42 kg    Estimated Nutritional Needs:  Kcal:  1700-1900 Protein:  80-100 Fluid:  >/= 1.7 L/day     Jarome Matin, MS, RD, LDN, CNSC Inpatient Clinical Dietitian RD pager # available in [place]  After hours/weekend pager # available in [facility]

## 2019-07-20 NOTE — Progress Notes (Signed)
Has armband been applied?  Yes.    Does patient have an allergy to IV contrast dye?: No.   Has patient ever received premedication for IV contrast dye?: No.   Does patient take metformin?: No.  Date of lab work: Jul 20, 2019 BUN: 9 CR: 0.37  IV site: upper arm right PICC, condition patent and no redness and left forearm, condition patent and no redness   Has IV site been added to flowsheet?  Yes.

## 2019-07-20 NOTE — Progress Notes (Signed)
SLP Cancellation Note  Patient Details Name: Devon Peterson MRN: [id number] DOB: Aug 04, 1955   Cancelled treatment:       Reason Eval/Treat Not Completed: Patient at procedure or test/unavailable.  Spoke with RN who stated that pt is about to leave for radiation treatment.  SLP will f/u as schedule allows.    Elvia Collum [facility] 07/20/2019, 11:57 AM

## 2019-07-20 NOTE — Progress Notes (Signed)
PMT no charge note  Patient off the floor this afternoon, for his radiation oncology appointment. As per family meeting discussions yesterday, patient and family elected for continuation of full code status, SNF rehab attempt with palliative to follow, chaplain consult to designate sister [name] to be his HCPOA agent, continuation of tube feeds and current therapies. PMT to continue to follow.   Loistine Chance MD [place] palliative [phone]

## 2019-07-20 NOTE — Progress Notes (Signed)
PROGRESS NOTE    Devon Peterson  [id number] DOB: Sep 16, 1955 DOA: 07/07/2019 PCP: Patient, No Pcp Per   Brief Narrative:  Devon Peterson a 64 y.o.BM PMHx COPD,Polysubstance abuse (EtOH, tobacco, Cocaine), hospital admission 04/17/2019-04/19/2019 for sepsis secondary to pneumonia and COPD exacerbation  Presenting to the ED via EMS for evaluation of cough, shortness of breath, fatigue, and unintentional weight loss.History provided by patient and his nephew at bedside. Nephew states patient was admitted to the hospital a month ago for pneumonia and since then has continued to do poorly. He has difficulty swallowing food and has been spitting up saliva. He is coughing a lot. Patient denies fevers, chills, shortness of breath, chest pain, nausea, vomiting, abdominal pain, diarrhea, or dysuria. Nephew states patient has been smoking a pack of cigarettes daily since a very young age. He has lost a lot of weight recently. Addendum: 07/07/2019 8:40 PM. Patient also denied history of hematemesis, hematochezia, or melena.  ED Course:Tachycardic on arrival with heart rate in the 150s, appeared to be sinus rhythm. Afebrile. Slightly tachypneic. Labs showing no leukocytosis. Initial lactic acid 2.5, repeat after IV fluid pending. Hemoglobin 12.7, stable compared to labs done in February 2021. Platelet count normal. BUN 27, creatinine 0.8. Albumin 3.3. LFTs normal. Blood culture x2 pending. SARS-CoV-2 PCR test negative. Influenza panel negative. UA not suggestive of infection. Urine culture pending. Chest x-ray showing new left lower lobe airspace opacity concerning for pneumonia. CT angiogram chest negative for PE. Showing abnormal soft tissue in the hypopharynx suspicious for underlying mass. Dense bilateral lower lobe consolidation, left greater than right. Aspiration suspected given findings in the hypopharynx. Also showing evidence of an enhancing mass within the densely consolidated  left lower lobe, metastatic disease or primary neoplasm not excluded. Marked cachexia.  Patient received ceftriaxone, azithromycin, and 2 L normal saline boluses.  **Interim History He was evaluated by ENT who recommended a tracheostomy.  Tracheostomy was done on 07/10/19 and postoperatively he ended up on pressors which were weaned off.  He is also getting a tissue biopsy and medical oncology as well as radiation oncology were consulted for further evaluation.  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 Postoperatively he had hypotension and went in A. fib with RVR and started on Neo-Synephrine drip has been stopped.  Critical care was consulted for further evaluation and his hypotension is improved.  We will continue IV fluid but patient needs long-term nutrition needs and needs a PEG tube and have called IR but they will reevaluate Monday for a PEG tube given the national shortage of PEG tube.  On 07/11/19 Patient's tracheostomy was doing okay but he was having a lot of secretions but not as much.  Again radiation oncology wants to have improvement in the patient's performance status as well as nutritional status as well as further evaluation of the lung mass with likely pet imaging as an outpatient and potential biopsy of lung lesion to determine if this is metastatic versus a second primary.  This will help determine radiation oncology if treatment will be of curative or more of a palliative approach.  They will continue to collaborate with medical oncology  On 07/12/19 he continued to have significant amount of secretions however they are becoming thicker we will try Mucomyst as well as Robinul to thin the secretions.  He denies any current pain at this time but was very weak and is severely deconditioned so PT OT was reconsulted again.   Subjective: 5/17 alert, nods yes and no to  questions.  Indicates negative abdominal pain, negative CP, negative SOB.  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 Wilburn Cornelia ENT placed tracheostomy -5/13 oncology sent PD-L1 testing on pathology. -5/13  Recommend a course of palliative or definitive radiation.  Radiation oncology consult has been completed.  Awaiting final recommendations. -5/13 oncology Will follow up on the lung mass and determine further work-up at a future date. - Outpatient follow-up at the cancer center -Continued thick secretions requiring frequent suctioning. -5/14Dr. [place] ENT upsized trach to #6 cuffless to help with secretions. -5/14 continue hypertonic saline nebulizer daily followed by albuterol nebulizer treatment  LEFT lung mass -Suspicious for neoplasm either second primary or metastatic.   - COPD -See aspiration pneumonia  Dysphagia -5/11 PEG tube placed -Osmolite 1.5 @ 20 ml/hr to advance by 10 ml every 12 hours to reach goal rate of 50 ml/hr -30 ml prostat once/day.  A. fib with RVR -Went into A. fib with RVR in the PACU and was given IV metoprolol -Heart rates have improved some but he is hypotensive and had to be placed on a Neo-Synephrine drip -We will not anticoagulate at this time but may need anticoagulation soon -5/16 currently NSR  Hypotension -Resolved  Severe protein calorie malnutrition/Underweight -See dysphagia   Generalized weakness/physical deconditioning: -PT and OT initial evaluation recommending Home Health PT but re-evaluation recommending SNF now  given his condition  EtOH abuse -Folic acid 1 mg daily -Thiamine 100 mg daily -Currently no signs or symptoms of withdrawal.  Cocaine abuse -5/5 positive cocaine -Avoid beta-blockers, although if absolutely required for control of HTN this far out would be safe  Tobacco abuse -NicoDerm patch -Counseled on need for absolute cessation  Hypoglycemia -In the setting of poor p.o. intake and was persistent -An outpatient on tube feeds should resolve -5/13 discontinue D10   -5/15 resolved  Hypokalemia -Potassium goal> 4 -K-Phos 30 mmol  Hypomagnesmia -Magnesium goal> 2 -Magnesium IV 3 g  Hypophosphatemia -See hyponatremia  Hyponatremia Recent Labs  Lab 07/17/19 0348 07/17/19 1703 07/18/19 0432 07/19/19 0445 07/20/19 0344  NA 121* 121* 122* 133* 133*  -Normal saline 5m/hr -5/16 NaCl 2 g TID (hold)   Normocytic Anemia -Patient's hemoglobin/hematocit is dropping slowly and went from 11.3/34.3 -> 10.0/29.5 -> 9.2/27.0 -Check anemia panel and showed an iron level of 30, U IBC 130, TIBC 150, saturation ratios of 19%, ferritin level 421, folate level 11.5, and vitamin B12 of 281 -Continue to monitor for signs and symptoms of bleeding; currently had a lot of bleeding from his trach site to be injected with lidocaine   Stage II sacral pressure ulcer (POA Pressure Injury 07/07/19 Sacrum Mid;Upper Stage 2 -  Partial thickness loss of dermis presenting as a shallow open injury with a red, pink wound bed without slough. 3 each small separate open areas (Active)  07/07/19 2107  Location: Sacrum  Location Orientation: Mid;Upper  Staging: Stage 2 -  Partial thickness loss of dermis presenting as a  shallow open injury with a red, pink wound bed without slough.  Wound Description (Comments): 3 each small separate open areas  Present on Admission: Yes  -Continue with Mepilex pads and frequent turning    Goals of care 5/13 palliative care consult; patient with poorly  differentiated squamous carcinoma, extremely cachectic now trach and PEG dependent.  Evaluate for change of CODE STATUS, consider hospice -- PT OT recommending SNF.  Will await palliative care recommendations -5/17 PT/OT update consult; in order to place patient per NCM need an updated consult recommending SNF    DVT prophylaxis: SCD Code Status: Full Family Communication: 5/16 whole family present for discussion of plan of care.  Dr. Rowe Pavy from palliative care also present.  Per Dr. Inda Castle note family decided on the following; Patient and family to discuss about no CPR/DNR status. Will possibly consider skilled nursing facility rehabilitation attempt with continuation of tube feedings and radiation treatments as well.  Follow-up with medical oncology in the outpatient setting.  Disposition Plan:  Status is: Inpatient  Dispo: The patient is from: Home              Anticipated d/c is to: SNF?  Hospice?              Anticipated d/c date is: 07/22/2019              Patient currently unstable      Consultants:  PCCM Oncology Dr. Bertram Savin [facility] ENT     Procedures/Significant Events:  5/4 CTA chest W contrast;-negative PE. - Abnormal soft tissue in the hypopharynx, suspicious for underlying mass. CT neck or direct visual inspection recommended for further evaluation. - Dense bilateral lower lobe consolidation, LEFT >> RIGHT . Aspiration is suspected given findings in the hypopharynx. -Evidence of an enhancing mass within the densely consolidated left lower lobe, metastatic disease or primary neoplasm not excluded.  Laryngeal cancer -07/08/2019 CT of the neck showed enhancing hypopharyngeal soft tissue suspicious for malignancy -07/09/2019 tracheostomy placement and biopsy             -07/09/2019 pathology consistent with poorly differentiated squamous cell carcinoma with basaloid features Left lower lobe of the lung mass concerning for primary neoplasm versus  metastatic disease -07/07/2019 CT angiogram of the chest 2.7 cm mass in the left lower lobe of the lung 5/14 Dr. Redmond Baseman ENT upsized #6 cuffless trach>>>   I have personally reviewed and interpreted all radiology studies and my findings are as above.  VENTILATOR SETTINGS:    Cultures   Antimicrobials: Anti-infectives (From admission, onward)   Start     Dose/Rate Stop   07/07/19 2200  ampicillin-sulbactam (UNASYN) 1.5 g in sodium chloride 0.9 % 100 mL IVPB     1.5 g 200 mL/hr over 30 Minutes 07/14/19 1606   07/07/19 1930  metroNIDAZOLE (FLAGYL) IVPB 500 mg  Status:  Discontinued     500 mg 100 mL/hr over 60 Minutes 07/07/19 2003   07/07/19 1800  cefTRIAXone (ROCEPHIN) 1 g in sodium chloride 0.9 % 100 mL IVPB     1 g 200 mL/hr over 30 Minutes 07/07/19 1830   07/07/19 1800  azithromycin (ZITHROMAX) 500 mg in sodium chloride 0.9 % 250 mL IVPB     500 mg 250 mL/hr over 60 Minutes 07/07/19 1855       Devices    LINES / TUBES:  ENT upsized #6 cuffless trach 5/14>>>    Continuous Infusions: . sodium chloride 75 mL/hr at 07/20/19 0600  .  feeding supplement (OSMOLITE 1.5 CAL) 50 mL/hr at 07/19/19 1930  . potassium PHOSPHATE IVPB (in mmol)       Objective: Vitals:   07/20/19 0300 07/20/19 0400 07/20/19 0500 07/20/19 0600  BP: (!) 162/77 116/70 133/84 108/69  Pulse: 86 81 100 84  Resp: 16 19 (!) 23 16  Temp:      TempSrc:      SpO2: 100% 96% 97% 98%  Weight:      Height:        Intake/Output Summary (Last 24 hours) at 07/20/2019 0746 Last data filed at 07/20/2019 0600 Gross per 24 hour  Intake 2856.69 ml  Output 3125 ml  Net -268.31 ml   Filed Weights   07/07/19 2049 07/09/19 0949  Weight: 42 kg 42 kg   Physical Exam:  General: A/O x4, positive  acute respiratory distress, cachectic Eyes: negative scleral hemorrhage, negative anisocoria, negative icterus ENT: Negative Runny nose, negative gingival bleeding, #6 uncuffed trach in place, negative  sign of infection or bleeding Neck:  Negative scars, masses, torticollis, lymphadenopathy, JVD Lungs: Clear to auscultation bilaterally without wheezes or crackles Cardiovascular: Regular rate and rhythm without murmur gallop or rub normal S1 and S2 Abdomen: negative abdominal pain, nondistended, positive soft, bowel sounds, no rebound, no ascites, no appreciable mass, PEG in place covered and clean negative sign of infection Extremities: No significant cyanosis, clubbing, or edema bilateral lower extremities Skin: Negative rashes, lesions, ulcers Psychiatric:  Negative depression, negative anxiety, negative fatigue, negative mania  Central nervous system:  Cranial nerves II through XII intact, tongue/uvula midline, all extremities muscle strength 5/5, sensation intact throughout, negative dysarthria, negative expressive aphasia, negative receptive aphasia.   .     Data Reviewed: Care during the described time interval was provided by me .  I have reviewed this patient's available data, including medical history, events of note, physical examination, and all test results as part of my evaluation.   CBC: Recent Labs  Lab 07/16/19 0437 07/17/19 0348 07/18/19 0432 07/19/19 0445 07/20/19 0344  WBC 4.7 5.6 5.2 5.1 5.7  NEUTROABS 2.8 3.7 3.5 3.6 4.0  HGB 9.7* 9.4* 8.8* 8.9* 8.5*  HCT 27.7* 27.5* 25.6* 26.4* 25.8*  MCV 82.2 80.6 81.8 83.3 84.9  PLT 297 324 323 353 559   Basic Metabolic Panel: Recent Labs  Lab 07/16/19 0437 07/16/19 0437 07/17/19 0348 07/17/19 1703 07/18/19 0432 07/19/19 0445 07/20/19 0344  NA 121*   < > 121* 121* 122* 133* 133*  K 3.7   < > 4.0 3.9 4.3 3.8 3.9  CL 91*   < > 90* 89* 91* 98 97*  CO2 23   < > '23 23 22 29 30  '$ GLUCOSE 134*   < > 104* 131* 98 95 93  BUN <5*   < > '8 11 10 10 9  '$ CREATININE 0.45*   < > 0.53* 0.46* 0.44* 0.44* 0.37*  CALCIUM 7.8*   < > 7.6* 7.4* 7.4* 7.7* 7.9*  MG 1.5*  --  2.0  --  1.6* 1.8 1.5*  PHOS 2.3*  --  3.1  --  1.9* 2.1*  2.3*   < > = values in this interval not displayed.   GFR: Estimated Creatinine Clearance: 55.4 mL/min (A) (by C-G formula based on SCr of 0.37 mg/dL (L)). Liver Function Tests: Recent Labs  Lab 07/16/19 0437 07/17/19 0348 07/18/19 0432 07/19/19 0445 07/20/19 0344  AST '16 19 26 27 '$ 37  ALT '9 11 10 14 22  '$ ALKPHOS 54  57 55 61 65  BILITOT 0.6 0.5 0.5 0.2* 0.3  PROT 5.5* 5.7* 5.4* 5.2* 5.3*  ALBUMIN 2.1* 2.2* 2.1* 2.0* 2.1*   No results for input(s): LIPASE, AMYLASE in the last 168 hours. No results for input(s): AMMONIA in the last 168 hours. Coagulation Profile: No results for input(s): INR, PROTIME in the last 168 hours. Cardiac Enzymes: No results for input(s): CKTOTAL, CKMB, CKMBINDEX, TROPONINI in the last 168 hours. BNP (last 3 results) No results for input(s): PROBNP in the last 8760 hours. HbA1C: No results for input(s): HGBA1C in the last 72 hours. CBG: Recent Labs  Lab 07/19/19 1231 07/19/19 1559 07/19/19 2001 07/19/19 2358 07/20/19 0342  GLUCAP 99 76 80 97 88   Lipid Profile: No results for input(s): CHOL, HDL, LDLCALC, TRIG, CHOLHDL, LDLDIRECT in the last 72 hours. Thyroid Function Tests: No results for input(s): TSH, T4TOTAL, FREET4, T3FREE, THYROIDAB in the last 72 hours. Anemia Panel: No results for input(s): VITAMINB12, FOLATE, FERRITIN, TIBC, IRON, RETICCTPCT in the last 72 hours. Urine analysis:    Component Value Date/Time   COLORURINE YELLOW 07/07/2019 1729   APPEARANCEUR HAZY (A) 07/07/2019 1729   LABSPEC 1.016 07/07/2019 1729   PHURINE 5.0 07/07/2019 1729   GLUCOSEU NEGATIVE 07/07/2019 1729   HGBUR NEGATIVE 07/07/2019 1729   BILIRUBINUR NEGATIVE 07/07/2019 1729   KETONESUR NEGATIVE 07/07/2019 1729   PROTEINUR 30 (A) 07/07/2019 1729   UROBILINOGEN 1.0 10/28/2008 0458   NITRITE NEGATIVE 07/07/2019 1729   LEUKOCYTESUR NEGATIVE 07/07/2019 1729   Sepsis Labs: '@LABRCNTIP'$ (procalcitonin:4,lacticidven:4)  ) No results found for this or any  previous visit (from the past 240 hour(s)).       Radiology Studies: No results found.      Scheduled Meds: . albuterol  2.5 mg Nebulization Daily  . chlorhexidine  15 mL Mouth Rinse BID  . Chlorhexidine Gluconate Cloth  6 each Topical Daily  . feeding supplement (PRO-STAT SUGAR FREE 64)  30 mL Per Tube Daily  . glycopyrrolate  0.1 mg Intravenous BID  . mouth rinse  15 mL Mouth Rinse q12n4p  . thiamine  100 mg Intravenous Daily   Continuous Infusions: . sodium chloride 75 mL/hr at 07/20/19 0600  . feeding supplement (OSMOLITE 1.5 CAL) 50 mL/hr at 07/19/19 1930  . potassium PHOSPHATE IVPB (in mmol)       LOS: 13 days   The patient is critically ill with multiple organ systems failure and requires high complexity decision making for assessment and support, frequent evaluation and titration of therapies, application of advanced monitoring technologies and extensive interpretation of multiple databases. Critical Care Time devoted to patient care services described in this note  Time spent: 40 minutes     Deryl Ports, Geraldo Docker, MD [facility] Pager [phone]  If 7PM-7AM, please contact night-coverage [email or web address] Password [email or web address] 07/20/2019, 7:46 AM

## 2019-07-20 NOTE — TOC Progression Note (Signed)
Transition of Care [facility]) - Progression Note    Patient Details  Name: Devon Peterson MRN: [id number] Date of Birth: May 03, 1955  [facility]) CM/SW Contact  Leeroy Cha, RN Phone Number: 07/20/2019, 9:39 AM  Clinical Narrative:    Palliative care for goal of care met with family and patient, remains a full code for now with aggressive care.  Possible snf placment-will follow for , trach collrt at 28%.   Expected Discharge Plan: [place] Barriers to Discharge: Continued Medical Work up  Expected Discharge Plan and Services Expected Discharge Plan: [place] Acute Care Choice: [place]                                         Social Determinants of Health (SDOH) Interventions    Readmission Risk Interventions No flowsheet data found.

## 2019-07-21 ENCOUNTER — Telehealth: Payer: Self-pay | Admitting: Internal Medicine

## 2019-07-21 LAB — COMPREHENSIVE METABOLIC PANEL
ALT: 30 U/L (ref 0–44)
AST: 42 U/L — ABNORMAL HIGH (ref 15–41)
Albumin: 2.3 g/dL — ABNORMAL LOW (ref 3.5–5.0)
Alkaline Phosphatase: 73 U/L (ref 38–126)
Anion gap: 8 (ref 5–15)
BUN: 9 mg/dL (ref 8–23)
CO2: 29 mmol/L (ref 22–32)
Calcium: 8 mg/dL — ABNORMAL LOW (ref 8.9–10.3)
Chloride: 95 mmol/L — ABNORMAL LOW (ref 98–111)
Creatinine, Ser: 0.35 mg/dL — ABNORMAL LOW (ref 0.61–1.24)
GFR calc Af Amer: >60 mL/min (ref >60)
GFR calc non Af Amer: >60 mL/min (ref >60)
Glucose, Bld: 102 mg/dL — ABNORMAL HIGH (ref 70–99)
Potassium: 3.9 mmol/L (ref 3.5–5.1)
Sodium: 132 mmol/L — ABNORMAL LOW (ref 135–145)
Total Bilirubin: 0.4 mg/dL (ref 0.3–1.2)
Total Protein: 5.8 g/dL — ABNORMAL LOW (ref 6.5–8.1)

## 2019-07-21 LAB — CBC WITH DIFFERENTIAL/PLATELET
Abs Immature Granulocytes: 0.01 10*3/uL (ref 0.00–0.07)
Basophils Absolute: 0 10*3/uL (ref 0.0–0.1)
Basophils Relative: 1 %
Eosinophils Absolute: 0 10*3/uL (ref 0.0–0.5)
Eosinophils Relative: 1 %
HCT: 26.9 % — ABNORMAL LOW (ref 39.0–52.0)
Hemoglobin: 8.9 g/dL — ABNORMAL LOW (ref 13.0–17.0)
Immature Granulocytes: 0 %
Lymphocytes Relative: 25 %
Lymphs Abs: 1.2 10*3/uL (ref 0.7–4.0)
MCH: 28.2 pg (ref 26.0–34.0)
MCHC: 33.1 g/dL (ref 30.0–36.0)
MCV: 85.1 fL (ref 80.0–100.0)
Monocytes Absolute: 0.4 10*3/uL (ref 0.1–1.0)
Monocytes Relative: 8 %
Neutro Abs: 3.2 10*3/uL (ref 1.7–7.7)
Neutrophils Relative %: 65 %
Platelets: 408 10*3/uL — ABNORMAL HIGH (ref 150–400)
RBC: 3.16 MIL/uL — ABNORMAL LOW (ref 4.22–5.81)
RDW: 13.3 % (ref 11.5–15.5)
WBC: 4.8 10*3/uL (ref 4.0–10.5)
nRBC: 0 % (ref 0.0–0.2)

## 2019-07-21 LAB — GLUCOSE, CAPILLARY
Glucose-Capillary: 76 mg/dL (ref 70–99)
Glucose-Capillary: 77 mg/dL (ref 70–99)
Glucose-Capillary: 80 mg/dL (ref 70–99)
Glucose-Capillary: 84 mg/dL (ref 70–99)
Glucose-Capillary: 86 mg/dL (ref 70–99)
Glucose-Capillary: 88 mg/dL (ref 70–99)

## 2019-07-21 LAB — PHOSPHORUS: Phosphorus: 3.1 mg/dL (ref 2.5–4.6)

## 2019-07-21 LAB — MAGNESIUM: Magnesium: 1.6 mg/dL — ABNORMAL LOW (ref 1.7–2.4)

## 2019-07-21 NOTE — Progress Notes (Signed)
Occupational Therapy Treatment Patient Details Name: Basel Defalco MRN: [id number] DOB: 10-11-1955 Today's Date: 07/21/2019    History of present illness 64 yo male admitted with aspiration pneumonia, with newly identified mass located in the hypopharynx.  Went to the operating room on 5/6 for direct laryngoscopy, biopsy, and tracheostomy.  Marland Kitchen Hx of ETOH abuse, COPD, dysphagia, polysubstance abuse, malnutrition. S/P PEG   OT comments  Patient agreeable to getting up to chair. Patient require min A for bed mobility and min A x2 to ambulate to recliner chair. Patient require assist with managing walker with R LE coming outside base of walker due to decreased balance/stability and strength. Patient reports feeling fatigued once seated in chair. Pt 94% on 4L and 28% FiO2. Will continue to follow.   Follow Up Recommendations  SNF;Supervision/Assistance - 24 hour    Equipment Recommendations  Other (comment)(TBD)       Precautions / Restrictions Precautions Precautions: Fall Precaution Comments: trach on 28%(venti tube in room) TC; PEG/NPO       Mobility Bed Mobility Overal bed mobility: Needs Assistance Bed Mobility: Supine to Sit     Supine to sit: Min assist     General bed mobility comments: min HHA to sit up  Transfers Overall transfer level: Needs assistance Equipment used: Rolling walker (2 wheeled) Transfers: Sit to/from Stand Sit to Stand: Min assist;+2 safety/equipment;+2 physical assistance Stand pivot transfers: Mod assist;+2 safety/equipment;+2 physical assistance       General transfer comment: pt requires steadying assist    Balance Overall balance assessment: Needs assistance Sitting-balance support: No upper extremity supported;Feet supported Sitting balance-Leahy Scale: Good     Standing balance support: Bilateral upper extremity supported;During functional activity Standing balance-Leahy Scale: Poor Standing balance comment: reliant on RW                            ADL either performed or assessed with clinical judgement   ADL Overall ADL's : Needs assistance/impaired                         Toilet Transfer: Minimal assistance;+2 for physical assistance;+2 for safety/equipment;Ambulation;RW Toilet Transfer Details (indicate cue type and reason): Bed>recliner 8 feet away         Functional mobility during ADLs: Minimal assistance;+2 for physical assistance;+2 for safety/equipment;Cueing for safety;Rolling walker                 Cognition Arousal/Alertness: Awake/alert Behavior During Therapy: Flat affect Overall Cognitive Status: Within Functional Limits for tasks assessed                                 General Comments: non verbal TRACH but following all commands              General Comments VSS on trach 28% FIO2 and 4L on portable tank    Pertinent Vitals/ Pain       Pain Assessment: Faces Faces Pain Scale: Hurts a little bit Pain Location: abdominal pain from PEG placement Pain Descriptors / Indicators: Sore         Frequency  Min 2X/week        Progress Toward Goals  OT Goals(current goals can now be found in the care plan section)  Progress towards OT goals: Progressing toward goals  Acute Rehab OT Goals Patient Stated Goal: agreed to mobility OT Goal  Formulation: With patient Time For Goal Achievement: 08/04/19 Potential to Achieve Goals: Good ADL Goals Pt Will Perform Lower Body Dressing: sit to/from stand;sitting/lateral leans;with min guard assist Pt Will Transfer to Toilet: with min guard assist;ambulating;bedside commode Pt Will Perform Toileting - Clothing Manipulation and hygiene: with min guard assist;sitting/lateral leans;sit to/from stand Pt/caregiver will Perform Home Exercise Program: Increased strength;Both right and left upper extremity;Independently  Plan Discharge plan remains appropriate;Frequency remains appropriate    Co-evaluation     PT/OT/SLP Co-Evaluation/Treatment: Yes Reason for Co-Treatment: Complexity of the patient's impairments (multi-system involvement);For patient/therapist safety PT goals addressed during session: Mobility/safety with mobility OT goals addressed during session: ADL's and self-care      AM-PAC OT "6 Clicks" Daily Activity     Outcome Measure   Help from another person eating meals?: Total(NPO) Help from another person taking care of personal grooming?: A Little Help from another person toileting, which includes using toliet, bedpan, or urinal?: A Lot Help from another person bathing (including washing, rinsing, drying)?: A Lot Help from another person to put on and taking off regular upper body clothing?: A Lot Help from another person to put on and taking off regular lower body clothing?: Total 6 Click Score: 11    End of Session Equipment Utilized During Treatment: Gait belt;Rolling walker;Oxygen  OT Visit Diagnosis: Unsteadiness on feet (R26.81);Other abnormalities of gait and mobility (R26.89);Muscle weakness (generalized) (M62.81)   Activity Tolerance Patient tolerated treatment well   Patient Left in chair;with call bell/phone within reach;with chair alarm set   Nurse Communication Mobility status        Time: 2094-7096 OT Time Calculation (min): 27 min  Charges: OT General Charges $OT Visit: 1 Visit OT Treatments $Self Care/Home Management : 8-22 mins  Delbert Phenix OT Pager: [place] 07/21/2019, 1:54 PM

## 2019-07-21 NOTE — Progress Notes (Signed)
PROGRESS NOTE    Devon Peterson  [id number] DOB: 07-Apr-1955 DOA: 07/07/2019 PCP: Patient, No Pcp Per   Brief Narrative:  Devon Peterson a 64 y.o.BM PMHx COPD,Polysubstance abuse (EtOH, tobacco, Cocaine), hospital admission 04/17/2019-04/19/2019 for sepsis secondary to pneumonia and COPD exacerbation  Presenting to the ED via EMS for evaluation of cough, shortness of breath, fatigue, and unintentional weight loss.History provided by patient and his nephew at bedside. Nephew states patient was admitted to the hospital a month ago for pneumonia and since then has continued to do poorly. He has difficulty swallowing food and has been spitting up saliva. He is coughing a lot. Patient denies fevers, chills, shortness of breath, chest pain, nausea, vomiting, abdominal pain, diarrhea, or dysuria. Nephew states patient has been smoking a pack of cigarettes daily since a very young age. He has lost a lot of weight recently. Addendum: 07/07/2019 8:40 PM. Patient also denied history of hematemesis, hematochezia, or melena.  ED Course:Tachycardic on arrival with heart rate in the 150s, appeared to be sinus rhythm. Afebrile. Slightly tachypneic. Labs showing no leukocytosis. Initial lactic acid 2.5, repeat after IV fluid pending. Hemoglobin 12.7, stable compared to labs done in February 2021. Platelet count normal. BUN 27, creatinine 0.8. Albumin 3.3. LFTs normal. Blood culture x2 pending. SARS-CoV-2 PCR test negative. Influenza panel negative. UA not suggestive of infection. Urine culture pending. Chest x-ray showing new left lower lobe airspace opacity concerning for pneumonia. CT angiogram chest negative for PE. Showing abnormal soft tissue in the hypopharynx suspicious for underlying mass. Dense bilateral lower lobe consolidation, left greater than right. Aspiration suspected given findings in the hypopharynx. Also showing evidence of an enhancing mass within the densely consolidated  left lower lobe, metastatic disease or primary neoplasm not excluded. Marked cachexia.  Patient received ceftriaxone, azithromycin, and 2 L normal saline boluses.  **Interim History He was evaluated by ENT who recommended a tracheostomy.  Tracheostomy was done on 07/10/19 and postoperatively he ended up on pressors which were weaned off.  He is also getting a tissue biopsy and medical oncology as well as radiation oncology were consulted for further evaluation.  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 Postoperatively he had hypotension and went in A. fib with RVR and started on Neo-Synephrine drip has been stopped.  Critical care was consulted for further evaluation and his hypotension is improved.  We will continue IV fluid but patient needs long-term nutrition needs and needs a PEG tube and have called IR but they will reevaluate Monday for a PEG tube given the national shortage of PEG tube.  On 07/11/19 Patient's tracheostomy was doing okay but he was having a lot of secretions but not as much.  Again radiation oncology wants to have improvement in the patient's performance status as well as nutritional status as well as further evaluation of the lung mass with likely pet imaging as an outpatient and potential biopsy of lung lesion to determine if this is metastatic versus a second primary.  This will help determine radiation oncology if treatment will be of curative or more of a palliative approach.  They will continue to collaborate with medical oncology  On 07/12/19 he continued to have significant amount of secretions however they are becoming thicker we will try Mucomyst as well as Robinul to thin the secretions.  He denies any current pain at this time but was very weak and is severely deconditioned so PT OT was reconsulted again.   Subjective: 5/18 alert, participating with physical therapy.  Was able to ambulate from bed to chair but feels totally exhausted.  Negative  abdominal pain, negative CP.   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 Wilburn Cornelia ENT placed tracheostomy -5/13 oncology sent PD-L1 testing on pathology. -5/13  Recommend a course of palliative or definitive radiation.  Radiation oncology consult has been completed.  Awaiting final recommendations. -5/13 oncology Will follow up on the lung mass and determine further work-up at a future date. - Outpatient follow-up at the cancer center -Continued thick secretions requiring frequent suctioning. -5/14Dr. [place] ENT upsized trach to #6 cuffless to help with secretions. -5/14 continue hypertonic saline nebulizer daily followed by albuterol nebulizer treatment -5/18 per Dr. Micheline Rough palliative care note plan for patient's care is as following;  -Full code/full scope  -Plan for 4-week course of radiation followed by reevaluation for potential for systemic  therapy LEFT lung mass -Suspicious for neoplasm either second primary or metastatic.   - COPD -See aspiration pneumonia  Dysphagia -5/11 PEG tube placed -Osmolite 1.5 @ 20 ml/hr to advance by 10 ml every 12 hours to reach goal rate of 50 ml/hr -30 ml prostat once/day.  A. fib with RVR -Went into A. fib with RVR in the PACU and was given IV metoprolol -Heart rates have improved some but he is hypotensive and had to be placed on a Neo-Synephrine drip -We will not anticoagulate at this time but may need anticoagulation  soon -5/16 currently NSR  Hypotension -Resolved  Severe protein calorie malnutrition/Underweight -See dysphagia   Generalized weakness/physical deconditioning: -PT and OT initial evaluation recommending Home Health PT but re-evaluation recommending SNF now given his condition  EtOH abuse -Folic acid 1 mg daily -Thiamine 100 mg daily -Currently no signs or symptoms of withdrawal.  Cocaine abuse -5/5 positive cocaine -Avoid beta-blockers, although if absolutely required for control of HTN this far out would be safe  Tobacco abuse -NicoDerm patch -Counseled on need for absolute cessation  Hypoglycemia -In the setting of poor p.o. intake and was persistent -An outpatient on tube feeds should resolve -5/13 discontinue D10   -5/15 resolved  Hypokalemia -Potassium goal> 4 -K-Phos 30 mmol  Hypomagnesmia -Magnesium goal> 2 -Magnesium IV 3 g  Hypophosphatemia -See hyponatremia  Hyponatremia Recent Labs  Lab 07/17/19 0348 07/17/19 1703 07/18/19 0432 07/19/19 0445 07/20/19 0344  NA 121* 121* 122* 133* 133*  -Normal saline 64m/hr -5/16 NaCl 2 g TID (hold)   Normocytic Anemia -Patient's hemoglobin/hematocit is dropping slowly and went from 11.3/34.3 -> 10.0/29.5 -> 9.2/27.0 -Check anemia panel and showed an iron level of 30, U IBC 130, TIBC 150, saturation ratios of 19%, ferritin level 421, folate level 11.5, and vitamin B12 of 281 -Continue to monitor for signs and symptoms of bleeding; currently had a lot of bleeding from his trach site to be injected with lidocaine   Stage II sacral pressure ulcer (POA Pressure Injury 07/07/19 Sacrum Mid;Upper Stage 2 -  Partial thickness loss of dermis presenting as a  shallow open injury with a red, pink wound bed without slough. 3 each small separate open areas (Active)  07/07/19 2107  Location: Sacrum  Location Orientation: Mid;Upper  Staging: Stage 2 -  Partial thickness loss of dermis presenting as a shallow open  injury with a red, pink wound bed without slough.  Wound Description (Comments): 3 each small separate open areas  Present on Admission: Yes  -Continue with Mepilex pads and frequent turning    Goals of care 5/13 palliative care consult; patient with poorly differentiated squamous carcinoma, extremely cachectic now trach and PEG dependent.  Evaluate for change of CODE STATUS, consider hospice -- PT OT recommending SNF.  Will await palliative care recommendations -5/17 PT/OT update consult; in order to place patient per NCM need an updated consult recommending SNF    DVT prophylaxis: SCD Code Status: Full Family Communication: 5/16 whole family present for discussion of plan of care.  Dr. Rowe Pavy from palliative care also present.  Per Dr. Inda Castle note family decided on the following; Patient and family to discuss about no CPR/DNR status. Will possibly consider skilled nursing facility rehabilitation attempt with continuation of tube feedings and radiation treatments as well.  Follow-up with medical oncology in the outpatient setting.  Disposition Plan:  Status is: Inpatient  Dispo: The patient is from: Home              Anticipated d/c is to: SNF?  Hospice?              Anticipated d/c date is: 07/22/2019              Patient currently unstable      Consultants:  PCCM Oncology Dr. Bertram Savin [facility] ENT     Procedures/Significant Events:  5/4 CTA chest W contrast;-negative PE. - Abnormal soft tissue in the hypopharynx, suspicious for underlying mass. CT neck or direct visual inspection recommended for further evaluation. - Dense bilateral lower lobe consolidation, LEFT >> RIGHT . Aspiration is suspected given findings in the hypopharynx. -Evidence of an enhancing mass within the densely consolidated left lower lobe, metastatic disease or primary neoplasm not excluded.  Laryngeal cancer -07/08/2019 CT of the neck showed enhancing hypopharyngeal soft tissue  suspicious for malignancy -07/09/2019 tracheostomy placement and biopsy             -07/09/2019 pathology consistent with poorly differentiated squamous cell carcinoma with basaloid features Left lower lobe of the lung mass concerning for primary neoplasm versus metastatic disease -07/07/2019 CT angiogram of the chest 2.7 cm mass in the left lower lobe of the lung 5/14 Dr. Redmond Baseman ENT upsized #6 cuffless trach>>>   I have personally reviewed and interpreted all radiology studies and my findings are as above.  VENTILATOR SETTINGS:    Cultures   Antimicrobials: Anti-infectives (From admission, onward)   Start     Dose/Rate Stop   07/07/19 2200  ampicillin-sulbactam (UNASYN) 1.5 g in sodium chloride 0.9 % 100 mL IVPB     1.5 g 200 mL/hr over 30 Minutes 07/14/19 1606   07/07/19 1930  metroNIDAZOLE (FLAGYL) IVPB 500 mg  Status:  Discontinued     500 mg 100 mL/hr over 60 Minutes 07/07/19 2003   07/07/19 1800  cefTRIAXone (ROCEPHIN) 1 g in sodium chloride 0.9 % 100 mL IVPB     1 g 200 mL/hr over 30 Minutes 07/07/19 1830   07/07/19 1800  azithromycin (ZITHROMAX) 500 mg in sodium chloride 0.9 % 250 mL IVPB     500 mg  250 mL/hr over 60 Minutes 07/07/19 1855       Devices    LINES / TUBES:  ENT upsized #6 cuffless trach 5/14>>>    Continuous Infusions: . sodium chloride 75 mL/hr at 07/21/19 0400  . feeding supplement (OSMOLITE 1.5 CAL) 1,000 mL (07/20/19 2104)     Objective: Vitals:   07/21/19 0400 07/21/19 0800 07/21/19 0846 07/21/19 0900  BP: (!) 127/94 111/70  108/74  Pulse:      Resp: (!) _0 Temp:  98.1 F (36.7 C)    TempSrc:  Oral    SpO2: 100%  100%   Weight:      Height:        Intake/Output Summary (Last 24 hours) at 07/21/2019 0915 Last data filed at 07/21/2019 0400 Gross per 24 hour  Intake 2123.66 ml  Output 1800 ml  Net 323.66 ml   Filed Weights   07/07/19 2049 07/09/19 0949 07/20/19 1600  Weight: 42 kg 42 kg 44 kg    Physical Exam:  General: A/O x4, positive acute respiratory distress, cachectic Eyes: negative scleral hemorrhage, negative anisocoria, negative icterus ENT: Negative Runny nose, negative gingival bleeding, Neck:  Negative scars, masses, torticollis, lymphadenopathy, JVD, #6 uncuffed trach in place, negative sign of infection or bleeding Lungs: Bibasilar rhonchi without wheezes or crackles Cardiovascular: Regular rate and rhythm without murmur gallop or rub normal S1 and S2 Abdomen: negative abdominal pain, nondistended, positive soft, bowel sounds, no rebound, no ascites, no appreciable mass, PEG tube in place covered and clean negative sign of infection Extremities: No significant cyanosis, clubbing, or edema bilateral lower extremities Skin: Negative rashes, lesions, ulcers Psychiatric:  Negative depression, negative anxiety, negative fatigue, negative mania  Central nervous system:  Cranial nerves II through XII intact, tongue/uvula midline, all extremities muscle strength 5/5, sensation intact throughout, negative dysarthria, negative expressive aphasia, negative receptive aphasia.    .     Data Reviewed: Care during the described time interval was provided by me .  I have reviewed this patient's available data, including medical history, events of note, physical examination, and all test results as part of my evaluation.   CBC: Recent Labs  Lab 07/16/19 0437 07/17/19 0348 07/18/19 0432 07/19/19 0445 07/20/19 0344  WBC 4.7 5.6 5.2 5.1 5.7  NEUTROABS 2.8 3.7 3.5 3.6 4.0  HGB 9.7* 9.4* 8.8* 8.9* 8.5*  HCT 27.7* 27.5* 25.6* 26.4* 25.8*  MCV 82.2 80.6 81.8 83.3 84.9  PLT 297 324 323 353 023   Basic Metabolic Panel: Recent Labs  Lab 07/17/19 0348 07/17/19 1703 07/18/19 0432 07/19/19 0445 07/20/19 0344 07/21/19 0614  NA 121* 121* 122* 133* 133*  --   K 4.0 3.9 4.3 3.8 3.9  --   CL 90* 89* 91* 98 97*  --   CO2 _1 --   GLUCOSE 104* 131* 98 95 93  --    BUN _2 --   CREATININE 0.53* 0.46* 0.44* 0.44* 0.37*  --   CALCIUM 7.6* 7.4* 7.4* 7.7* 7.9*  --   MG 2.0  --  1.6* 1.8 1.5* 1.6*  PHOS 3.1  --  1.9* 2.1* 2.3* 3.1   GFR: Estimated Creatinine Clearance: 58.1 mL/min (A) (by C-G formula based on SCr of 0.37 mg/dL (L)). Liver Function Tests: Recent Labs  Lab 07/16/19 0437 07/17/19 0348 07/18/19 0432 07/19/19 0445 07/20/19 0344  AST _3 37  ALT _4 22  ALKPHOS 54 57 55 61 65  BILITOT 0.6 0.5 0.5 0.2* 0.3  PROT 5.5* 5.7* 5.4* 5.2* 5.3*  ALBUMIN 2.1* 2.2* 2.1* 2.0* 2.1*   No results for input(s): LIPASE, AMYLASE in the last 168 hours. No results for input(s): AMMONIA in the last 168 hours. Coagulation Profile: No results for input(s): INR, PROTIME in the last 168 hours. Cardiac Enzymes: No results for input(s): CKTOTAL, CKMB, CKMBINDEX, TROPONINI in the last 168 hours. BNP (last 3 results) No results for input(s): PROBNP in the last 8760 hours. HbA1C: No results for input(s): HGBA1C in the last 72 hours. CBG: Recent Labs  Lab 07/20/19 1550 07/20/19 1928 07/20/19 2318 07/21/19 0031 07/21/19 0811  GLUCAP 73 93 68* 76 80   Lipid Profile: No results for input(s): CHOL, HDL, LDLCALC, TRIG, CHOLHDL, LDLDIRECT in the last 72 hours. Thyroid Function Tests: No results for input(s): TSH, T4TOTAL, FREET4, T3FREE, THYROIDAB in the last 72 hours. Anemia Panel: No results for input(s): VITAMINB12, FOLATE, FERRITIN, TIBC, IRON, RETICCTPCT in the last 72 hours. Urine analysis:    Component Value Date/Time   COLORURINE YELLOW 07/07/2019 1729   APPEARANCEUR HAZY (A) 07/07/2019 1729   LABSPEC 1.016 07/07/2019 1729   PHURINE 5.0 07/07/2019 1729   GLUCOSEU NEGATIVE 07/07/2019 1729   HGBUR NEGATIVE 07/07/2019 1729   BILIRUBINUR NEGATIVE 07/07/2019 1729   KETONESUR NEGATIVE 07/07/2019 1729   PROTEINUR 30 (A) 07/07/2019 1729   UROBILINOGEN 1.0 10/28/2008 0458   NITRITE NEGATIVE 07/07/2019 1729    LEUKOCYTESUR NEGATIVE 07/07/2019 1729   Sepsis Labs: _0 (procalcitonin:4,lacticidven:4)  ) Recent Results (from the past 240 hour(s))  SARS CORONAVIRUS 2 (TAT 6-24 HRS) Nasopharyngeal Nasopharyngeal Swab     Status: None   Collection Time: 07/20/19  4:35 PM   Specimen: Nasopharyngeal Swab  Result Value Ref Range Status   SARS Coronavirus 2 NEGATIVE NEGATIVE Final    Comment: (NOTE) SARS-CoV-2 target nucleic acids are NOT DETECTED. The SARS-CoV-2 RNA is generally detectable in upper and lower respiratory specimens during the acute phase of infection. Negative results do not preclude SARS-CoV-2 infection, do not rule out co-infections with other pathogens, and should not be used as the sole basis for treatment or other patient management decisions. Negative results must be combined with clinical observations, patient history, and epidemiological information. The expected result is Negative. Fact Sheet for Patients: SugarRoll.be Fact Sheet for Healthcare Providers: https://www.Venesha Petraitis-mathews.com/ This test is not yet approved or cleared by the [place] FDA and  has been authorized for detection and/or diagnosis of SARS-CoV-2 by FDA under an Emergency Use Authorization (EUA). This EUA will remain  in effect (meaning this test can be used) for the duration of the COVID-19 declaration under Section 56 4(b)(1) of the Act, 21 U.S.C. section 360bbb-3(b)(1), unless the authorization is terminated or revoked sooner. Performed at [facility], [place] [address], [place], [place] [address]          Radiology Studies: No results found.      Scheduled Meds: . albuterol  2.5 mg Nebulization Daily  . chlorhexidine  15 mL Mouth Rinse BID  . Chlorhexidine Gluconate Cloth  6 each Topical Daily  . feeding supplement (PRO-STAT SUGAR FREE 64)  30 mL Per Tube Daily  . glycopyrrolate  1 mg Per Tube BID  . mouth rinse  15 mL Mouth  Rinse q12n4p  . thiamine  100 mg Per Tube Daily   Continuous Infusions: . sodium chloride 75 mL/hr at 07/21/19 0400  . feeding supplement (OSMOLITE 1.5  CAL) 1,000 mL (07/20/19 2104)     LOS: 14 days   The patient is critically ill with multiple organ systems failure and requires high complexity decision making for assessment and support, frequent evaluation and titration of therapies, application of advanced monitoring technologies and extensive interpretation of multiple databases. Critical Care Time devoted to patient care services described in this note  Time spent: 40 minutes     Pedro Oldenburg, Geraldo Docker, MD [facility] Pager [phone]  If 7PM-7AM, please contact night-coverage [email or web address] Password [email or web address] 07/21/2019, 9:15 AM

## 2019-07-21 NOTE — Progress Notes (Signed)
Radiation Oncology         [phone] ________________________________  Name: Javonte Elenes MRN: [id number]  Date: 07/07/2019  DOB: 1955-07-10  Follow-Up Visit Note--  Inpatient   CC: Patient, No Pcp Per  No ref. 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 Curt came downstairs from the hospital along with his son today to discuss radiation planning.  He presents in a gurney.  His sister, [name], was present on speaker phone.  Since consultation with my colleague, the patient has had his PEG tube placed and he is receiving nutrition at last.  His trach is intact.  The patient has a white board but is not providing a written history.  He is unable to speak.  He does nod or shake his head at times  He has multiple comorbidities including COPD, severe malnutrition, history of alcohol tobacco and cocaine abuse, left lung mass, sepsis secondary to aspiration pneumonia  I have conferred with medical oncology, Dr. Benay Spice, and there is no immediate plan for systemic therapy.  There is the possibility of infectious versus metastatic versus second primary neoplastic disease in his chest but at this time biopsy of his lung will be postponed as the patient stabilizes.  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  His family expresses that he wants to pursue active treatment for his disease.  He is full code.  He cannot have a PET scan as he remains inpatient.                ALLERGIES:  has No Known Allergies.  Meds: Current Facility-Administered Medications  Medication Dose Route Frequency Provider Last Rate Last Admin  . 0.9 %  sodium chloride infusion   Intravenous Continuous Allie Bossier, MD 75 mL/hr at  07/21/19 0400 Rate Verify at 07/21/19 0400  . albuterol (PROVENTIL) (2.5 MG/3ML) 0.083% nebulizer solution 2.5 mg  2.5 mg Nebulization Daily Allie Bossier, MD   2.5 mg at 07/20/19 0845  . chlorhexidine (PERIDEX) 0.12 % solution 15 mL  15 mL Mouth Rinse BID Noemi Chapel P, DO   15 mL at 07/20/19 2101  . Chlorhexidine Gluconate Cloth 2 % PADS 6 each  6 each Topical Daily Julian Hy, DO   6 each at 07/20/19 0900  . feeding supplement (OSMOLITE 1.5 CAL) liquid 1,000 mL  1,000 mL Per Tube Continuous Allie Bossier, MD 20 mL/hr at 07/20/19 2104 1,000 mL at 07/20/19 2104  . feeding supplement (PRO-STAT SUGAR FREE 64) liquid 30 mL  30 mL Per Tube Daily Allie Bossier, MD   30 mL at 07/20/19 0900  . glycopyrrolate (ROBINUL) tablet 1 mg  1 mg Per Tube BID Allie Bossier, MD   1 mg at 07/20/19 2101  . ipratropium-albuterol (DUONEB) 0.5-2.5 (3) MG/3ML nebulizer solution 3 mL  3 mL Nebulization Q6H PRN Jerrell Belfast, MD      . labetalol (NORMODYNE) injection 10 mg  10 mg Intravenous Q2H PRN Opyd, Ilene Qua, MD   10 mg at 07/15/19 2305  . MEDLINE mouth rinse  15 mL Mouth Rinse q12n4p Noemi Chapel P, DO   15 mL at 07/20/19 1633  . ondansetron (ZOFRAN) injection 4 mg  4 mg Intravenous Q6H PRN Allie Bossier,  MD      . sodium chloride flush (NS) 0.9 % injection 10-40 mL  10-40 mL Intracatheter PRN Raiford Noble Gladstone, DO   10 mL at 07/11/19 2127  . thiamine tablet 100 mg  100 mg Per Tube Daily Allie Bossier, MD        Physical Findings: The patient is in no acute distress. Patient is alert and oriented. Wt Readings from Last 3 Encounters:  07/20/19 97 lb (44 kg)  04/18/19 98 lb 1.7 oz (44.5 kg)    height is '5\' 11"'$  (1.803 m) and weight is 97 lb (44 kg). His oral temperature is 98.2 F (36.8 C). His blood pressure is 127/94 (abnormal) and his pulse is 81. His respiration is 21 (abnormal) and oxygen saturation is 100%. .  General: Alert and oriented, in no acute distress He is lying on a gurney.   He is cachectic.  Lurline Idol is intact.  Oral cavity is moist with no thrush. No tumor visualized in upper throat.  No obvious neck adenopathy. ECOG 3  Lab Findings: Lab Results  Component Value Date   WBC 5.7 07/20/2019   HGB 8.5 (L) 07/20/2019   HCT 25.8 (L) 07/20/2019   MCV 84.9 07/20/2019   PLT 361 07/20/2019    Lab Results  Component Value Date   TSH 2.015 04/18/2019    Radiographic Findings: CT Soft Tissue Neck W Contrast  Result Date: 07/08/2019 CLINICAL DATA:  Neck mass EXAM: CT NECK WITH CONTRAST TECHNIQUE: Multidetector CT imaging of the neck was performed using the standard protocol following the bolus administration of intravenous contrast. CONTRAST:  66m OMNIPAQUE IOHEXOL 300 MG/ML  SOLN COMPARISON:  None. FINDINGS: Evaluation is limited by poor soft tissue resolution in the setting significant cachexia and probable diffuse edema. Additionally, motion artifact is present. Pharynx and larynx: There is enhancing soft tissue in the post cricoid region extending superiorly with effacement of the piriform sinuses and thickening of the aryepiglottic folds. Salivary glands: Unremarkable. Thyroid: Unremarkable. Lymph nodes: No definite enlarged lymph nodes. Vascular: Major neck vessels are patent. There is calcified plaque at the right greater than left ICA origins. There may be significant stenosis on the right. Limited intracranial: No abnormal enhancement. Visualized orbits: Unremarkable. Mastoids and visualized paranasal sinuses: Bilateral maxillary sinus air-fluid levels. Visualized mastoid air cells are clear. Skeleton: Multilevel degenerative changes of the cervical spine. Upper chest: Emphysema. New extension of centrilobular/tree-in-bud opacities within the left upper lobe. Probable small left pleural effusion Other: None. IMPRESSION: Suboptimal evaluation due to motion artifact and poor soft tissue resolution in the setting of significant cachexia and probable diffuse edema. Enhancing  hypopharyngeal soft tissue in the post cricoid region extending superiorly with effacement of the piriform sinuses and thickening of the aryepiglottic folds. Malignancy is suspected and direct visual inspection is recommended. Extension of pneumonia into the left upper lobe. Probable small left pleural effusion. Maxillary sinus air-fluid levels, a nonspecific finding that can reflect acute sinusitis in the appropriate setting. Electronically Signed   By: PMacy MisM.D.   On: 07/08/2019 13:45   CT Angio Chest PE W and/or Wo Contrast  Result Date: 07/07/2019 CLINICAL DATA:  Failure to thrive, history of pneumonia, cough EXAM: CT ANGIOGRAPHY CHEST WITH CONTRAST TECHNIQUE: Multidetector CT imaging of the chest was performed using the standard protocol during bolus administration of intravenous contrast. Multiplanar CT image reconstructions and MIPs were obtained to evaluate the vascular anatomy. CONTRAST:  759mOMNIPAQUE IOHEXOL 350 MG/ML SOLN COMPARISON:  07/07/2019 FINDINGS: Cardiovascular:  This is a technically adequate evaluation of the pulmonary vasculature. No filling defects or pulmonary emboli. The heart is unremarkable without pericardial effusion. Mild calcification of the mitral and aortic valves. Mild atherosclerosis of the coronary vasculature. Mediastinum/Nodes: No enlarged mediastinal, hilar, or axillary lymph nodes. Thyroid gland, trachea, and esophagus demonstrate no significant findings. Because of marked thoracic kyphosis and positioning, a significant portion of the hypopharynx is included on this exam. There is asymmetric soft tissue in the supraglottic region, incompletely evaluated on this study. I am suspicious of a mass in the right hypopharynx, reference image 15. Follow-up CT neck with contrast or direct visual inspection is recommended. Lungs/Pleura: There is severe background emphysema. Bilateral lower lobe consolidation is seen, left greater than right. Given the above findings in  the hypopharynx, aspiration should be considered in addition to community acquired pneumonia. Within the densely consolidated left lower lobe there is evidence of an enhancing 2.7 cm mass, which could reflect metastatic disease. This is best seen on image 126 of series 4. No effusion or pneumothorax. Upper Abdomen: Patient is markedly cachectic. No gross abnormalities are visualized. Musculoskeletal: No acute or destructive bony lesions. Reconstructed images demonstrate no additional findings. Review of the MIP images confirms the above findings. IMPRESSION: 1. No evidence of pulmonary embolus. 2. Abnormal soft tissue in the hypopharynx, suspicious for underlying mass. CT neck or direct visual inspection recommended for further evaluation. 3. Dense bilateral lower lobe consolidation, left greater than right. Aspiration is suspected given findings in the hypopharynx. 4. There is evidence of an enhancing mass within the densely consolidated left lower lobe, metastatic disease or primary neoplasm not excluded. 5. Marked cachexia. Electronically Signed   By: Randa Ngo M.D.   On: 07/07/2019 19:12   CT ABDOMEN PELVIS W CONTRAST  Result Date: 07/08/2019 CLINICAL DATA:  Lymphoma staging. EXAM: CT ABDOMEN AND PELVIS WITH CONTRAST TECHNIQUE: Multidetector CT imaging of the abdomen and pelvis was performed using the standard protocol following bolus administration of intravenous contrast. CONTRAST:  36m OMNIPAQUE IOHEXOL 300 MG/ML  SOLN COMPARISON:  CT chest 07/07/2019 FINDINGS: Lower chest: Lung bases demonstrate interval worsening bibasilar consolidation left much worse than right. Rim enhancing low-density structure within the dense consolidation in the left base without significant change which could represent underlying malignancy versus necrotic infection or pulmonary abscess. Calcification of the mitral valve annulus. Hepatobiliary: Liver, gallbladder and biliary tree are normal. Pancreas: Normal. Spleen:  Normal. Adrenals/Urinary Tract: Adrenal glands are unremarkable. Kidneys are normal in size and demonstrate bilateral streaky cortical low-attenuation which can be seen in pyelonephritis. No evidence of hydronephrosis. No definite Peri nephric inflammation or fluid. Ureters are not well visualized. Contrast is present within the bladder from patient's recent chest CT. Stomach/Bowel: Stomach and small bowel are unremarkable. Appendix is not well visualized. Colon is unremarkable. Moderate fecal retention over the rectum. Vascular/Lymphatic: Mild calcified plaque over the abdominal aorta which is normal caliber. No definite adenopathy. Reproductive: Normal. Other: Evidence of cachexia with significant decrease mesenteric fat and subcutaneous fat present. Musculoskeletal: Degenerative change of the spine and hips. IMPRESSION: 1. Interval worsening of bibasilar airspace consolidation left base worse than right likely due to pneumonia. Stable rim enhancing low-density focus over the left base consolidation which may be due to necrosis, pulmonary abscess or underlying malignancy. 2.  No evidence of metastatic disease within the abdomen. 3. Bilateral streaky cortical low-attenuation over the kidneys which can be seen in pyelonephritis. Recommend clinical correlation. 4.  Aortic Atherosclerosis (ICD10-I70.0). 5. Findings compatible patient's clinical  cachexia with significant decreased subcutaneous and mesenteric fat. Electronically Signed   By: Marin Olp M.D.   On: 07/08/2019 13:03   IR GASTROSTOMY TUBE MOD SED  Result Date: 07/14/2019 INDICATION: History of lymphoma and neck mass with tracheostomy. Please perform gastrostomy tube placement for enteric nutrition supplementation purposes. EXAM: PUSH GASTROSTOMY TUBE PLACEMENT COMPARISON:  CT abdomen pelvis-07/08/2019 MEDICATIONS: Patient is currently admitted to the hospital receiving intravenous antibiotics; Antibiotics were administered within 1 hour of the  procedure. CONTRAST:  15 mL of Isovue 300 administered into the gastric lumen. ANESTHESIA/SEDATION: Moderate (conscious) sedation was employed during this procedure. A total of Versed 1 mg and Fentanyl 25 mcg was administered intravenously. Moderate Sedation Time: 12 minutes. The patient's level of consciousness and vital signs were monitored continuously by radiology nursing throughout the procedure under my direct supervision. FLUOROSCOPY TIME:  1 minute, 12 seconds (6 mGy) COMPLICATIONS: None immediate. PROCEDURE: Informed written consent was obtained from the patient following explanation of the procedure, risks, benefits and alternatives. A time out was performed prior to the initiation of the procedure. Ultrasound scanning was performed to demarcate the edge of the left lobe of the liver. Maximal barrier sterile technique utilized including caps, mask, sterile gowns, sterile gloves, large sterile drape, hand hygiene and Betadine prep. The left upper quadrant was sterilely prepped and draped. A oral gastric catheter was inserted into the stomach under fluoroscopy. The existing nasogastric feeding tube was removed. The left costal margin was marked. Air was injected into the stomach for insufflation and visualization under fluoroscopy. Under sterile conditions and local anesthesia, 3 T tacks were utilized to pexy the anterior aspect of the stomach against the ventral abdominal wall. Contrast injection confirmed appropriate positioning of each of the T tacks. An incision was made between the T tacks and a 17 gauge trocar needle was utilized to access the stomach. Needle position was confirmed within the stomach with aspiration of air and injection of a small amount of contrast. Next, over an Amplatz wire, track was serially dilated ultimately allowing placement a peel-away sheath and an 18-French balloon retention gastrostomy tube. The retention balloon was insufflated with a mixture of dilute saline and contrast  and pulled taut against the anterior wall of the stomach. The external disc was cinched. Contrast injection confirms positioning within the stomach. Several spot radiographic images were obtained in various obliquities for documentation. The patient tolerated procedure well without immediate post procedural complication. FINDINGS: After successful fluoroscopic guided placement, the gastrostomy tube is appropriately positioned with internal retention balloon against the ventral aspect of the gastric lumen. IMPRESSION: Successful fluoroscopic insertion of an 14 French balloon retention gastrostomy tube. The gastrostomy may be used immediately for medication administration and in 24 hrs for the initiation of feeds. Electronically Signed   By: Sandi Mariscal M.D.   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 Scheduled for percutaneous gastrostomy tube placement. EXAM: IR FLUORO RM 0-60 MIN ANESTHESIA/SEDATION: Versed 1.0 mg, fentanyl 25 mcg. A radiology nurse monitored the patient for moderate sedation. MEDICATIONS: Glucagon 0.5 mg CONTRAST:  None PROCEDURE: Informed consent was obtained for percutaneous gastrostomy tube. The patient was placed supine on the interventional table. A 5 French orogastric tube was placed with fluoroscopy. Catheter was advanced into the stomach. Anterior abdomen was prepped and draped in sterile fashion. Maximal barrier sterile technique was utilized including caps, mask, sterile gowns, sterile gloves, sterile drape, hand  hygiene and skin antiseptic. Stomach was insufflated with air. Skin was anesthetized using 1% lidocaine. At this point in the procedure, there was a power outage and we were unable to regain power with fluoroscopic table and machine. As a result, the gastrostomy tube was aborted. COMPLICATIONS: None immediate FINDINGS: Orogastric tube placed in the stomach. Adequate  percutaneous window was identified with fluoroscopy. IMPRESSION: Aborted gastrostomy tube placement due to loss of power within the interventional radiology suite. Patient will be rescheduled for gastrostomy tube placement. Electronically Signed   By: Markus Daft M.D.   On: 07/14/2019 10:15   DG CHEST PORT 1 VIEW  Result Date: 07/14/2019 CLINICAL DATA:  Shortness of breath. EXAM: PORTABLE CHEST 1 VIEW COMPARISON:  Chest x-ray from yesterday. FINDINGS: Unchanged tracheostomy tube. The heart size and mediastinal contours are within normal limits. Normal pulmonary vascularity. Slightly improved consolidation in the left lower lobe. Unchanged mild airspace disease at the right lung base. Unchanged small left pleural effusion. The lungs remain hyperinflated with emphysematous changes. No pneumothorax. No acute osseous abnormality. IMPRESSION: 1. Multifocal pneumonia, slightly improved in the left lower lobe. 2. Unchanged small left pleural effusion. 3. COPD. Electronically Signed   By: Titus Dubin M.D.   On: 07/14/2019 07:36   DG CHEST PORT 1 VIEW  Result Date: 07/13/2019 CLINICAL DATA:  Shortness of breath EXAM: PORTABLE CHEST 1 VIEW COMPARISON:  Jul 11, 2019 FINDINGS: Tracheostomy catheter tip is 5.6 cm above the carina. No pneumothorax. Airspace consolidation throughout the left mid and lower lung zones with associated left pleural effusion noted. There is a small right pleural effusion. Right lung otherwise clear. Heart size and pulmonary vascularity are normal. No adenopathy no bone lesions. IMPRESSION: Airspace consolidation in the mid and lower lung zones on the left with left pleural effusion persists. Small right pleural effusion. No new opacity appreciable. Stable cardiac silhouette. No pneumothorax. Electronically Signed   By: Lowella Grip III M.D.   On: 07/13/2019 08:00   DG CHEST PORT 1 VIEW  Result Date: 07/11/2019 CLINICAL DATA:  Short of breath EXAM: PORTABLE CHEST 1 VIEW COMPARISON:  CT  07/07/2019, radiograph 07/09/2019 FINDINGS: Tracheostomy tube unchanged.  Normal cardiac silhouette. There is bibasilar airspace disease. Airspace disease is dense at the LEFT lung base. Findings correspond to a dense lower lobe pneumonia seen on comparison CT. Upper lobes are clear. IMPRESSION: No change in dense LEFT lower lobe pneumonia and mild RIGHT lower lobe pneumonia. Electronically Signed   By: Suzy Bouchard M.D.   On: 07/11/2019 08:01   DG CHEST PORT 1 VIEW  Result Date: 07/09/2019 CLINICAL DATA:  Short of breath. EXAM: PORTABLE CHEST 1 VIEW COMPARISON:  07/07/2019 FINDINGS: Normal heart size. Interval complete atelectasis of the left lower lobe with hyperexpansion of the left upper lobe. Progressive airspace disease within the left upper lobe. Right lung is clear. IMPRESSION: 1. Interval complete atelectasis of the left lower lobe which is concerning for either postobstructive changes due to left hilar adenopathy versus mucous plugging. 2. Progressive airspace disease within the hyperexpanded left lung. Electronically Signed   By: Kerby Moors M.D.   On: 07/09/2019 08:26   DG Chest Port 1 View  Result Date: 07/07/2019 CLINICAL DATA:  Cough. EXAM: PORTABLE CHEST 1 VIEW COMPARISON:  04/18/2019 FINDINGS: There is a new left lower lobe airspace opacity concerning for pneumonia. The heart size is stable. Aortic calcifications are noted. The lungs are hyperexpanded. There is no pneumothorax. There is no acute osseous abnormality. IMPRESSION: 1. New  left lower lobe airspace opacity concerning for pneumonia. Follow-up to radiologic resolution is recommended. 2. COPD. Electronically Signed   By: Constance Holster M.D.   On: 07/07/2019 17:18   DG Swallowing Func-Speech Pathology  Result Date: 07/08/2019 Objective Swallowing Evaluation: Type of Study: MBS-Modified Barium Swallow Study  Patient Details Name: Demontez Novack MRN: [id number] Date of Birth: 11-01-1955 Today's Date: 07/08/2019 Time: SLP Start Time  (ACUTE ONLY): 5449 -SLP Stop Time (ACUTE ONLY): 1320 SLP Time Calculation (min) (ACUTE ONLY): 25 min Past Medical History: Past Medical History: Diagnosis Date . Medical history non-contributory  Past Surgical History: Past Surgical History: Procedure Laterality Date . NO PAST SURGERIES   HPI: 64 yo with progressive dysphagia, appears cachetic, found to have likely aspiration pna per CT - enhancing mass LLL- suspected aspiration.  . Imaging studies also concerning for soft tissue mass in hypopharynx. Pt has been a consumer of ETOH - beer and smokes cigarettes.   CT neck today showed "Enhancing hypopharyngeal soft tissue in the post cricoid region extending superiorly with effacement of the piriform sinuses andthickening of the aryepiglottic folds."  Pt desired to eat/drink and swallow evaluation had been ordered. SLP advised an MBS to view impact of mass on pharyngeal swallow.  Subjective: pt awake in chair Assessment / Plan / Recommendation CHL IP CLINICAL IMPRESSIONS 07/08/2019 Clinical Impression Pt presents with gross obstructive based dysphagia secondary to his "Enhancing hypopharyngeal soft tissue in the post cricoid region extending superiorly with effacement of the piriform sinuses and thickening of the aryepiglottic folds"per CT imaging.  Gross pharyngeal-cervical esophageal retention mixed with secretions was aspirated post=swallow as residuals spilled into an open airway. Pt consumption of small cup bolus resulted in improved muscular contraction and thus clearance into esophagus.  Recommend pt be npo except single ice chips after oral care.  Pt was informed to clinical reasoning for recommendations but immediately asked if he could eat - clearly demonstrating compromised understanding. SLP Visit Diagnosis Dysphagia, oropharyngeal phase (R13.12);Dysphagia, pharyngoesophageal phase (R13.14) Attention and concentration deficit following -- Frontal lobe and executive function deficit following -- Impact on  safety and function Severe aspiration risk;Risk for inadequate nutrition/hydration   CHL IP TREATMENT RECOMMENDATION 07/08/2019 Treatment Recommendations Therapy as outlined in treatment plan below   Prognosis 07/08/2019 Prognosis for Safe Diet Advancement Guarded Barriers to Reach Goals -- Barriers/Prognosis Comment severity of dysphagia due to pt's mass CHL IP DIET RECOMMENDATION 07/08/2019 SLP Diet Recommendations NPO;Ice chips PRN after oral care Liquid Administration via -- Medication Administration Via alternative means Compensations (No Data) Postural Changes --   CHL IP OTHER RECOMMENDATIONS 07/08/2019 Recommended Consults -- Oral Care Recommendations Oral care QID Other Recommendations --   CHL IP FOLLOW UP RECOMMENDATIONS 07/08/2019 Follow up Recommendations (No Data)   CHL IP FREQUENCY AND DURATION 07/08/2019 Speech Therapy Frequency (ACUTE ONLY) min 2x/week Treatment Duration 2 weeks      CHL IP ORAL PHASE 07/08/2019 Oral Phase Impaired Oral - Pudding Teaspoon -- Oral - Pudding Cup -- Oral - Honey Teaspoon -- Oral - Honey Cup -- Oral - Nectar Teaspoon Reduced posterior propulsion;Weak lingual manipulation;Delayed oral transit;Other (Comment) Oral - Nectar Cup Reduced posterior propulsion;Weak lingual manipulation;Delayed oral transit;Other (Comment);Lingual/palatal residue Oral - Nectar Straw -- Oral - Thin Teaspoon Weak lingual manipulation;Delayed oral transit;Other (Comment);Premature spillage Oral - Thin Cup Delayed oral transit;Weak lingual manipulation;Reduced posterior propulsion;Other (Comment);Lingual/palatal residue Oral - Thin Straw -- Oral - Puree -- Oral - Mech Soft -- Oral - Regular -- Oral - Multi-Consistency -- Oral - Pill --  Oral Phase - Comment lingual rocking observed across all boluses  CHL IP PHARYNGEAL PHASE 07/08/2019 Pharyngeal Phase Impaired Pharyngeal- Pudding Teaspoon -- Pharyngeal -- Pharyngeal- Pudding Cup -- Pharyngeal -- Pharyngeal- Honey Teaspoon -- Pharyngeal -- Pharyngeal- Honey Cup --  Pharyngeal -- Pharyngeal- Nectar Teaspoon Pharyngeal residue - pyriform;Pharyngeal residue - posterior pharnyx;Pharyngeal residue - cp segment;Penetration/Apiration after swallow;Moderate aspiration;Lateral channel residue;Inter-arytenoid space residue;Reduced airway/laryngeal closure;Reduced epiglottic inversion;Reduced anterior laryngeal mobility;Reduced laryngeal elevation Pharyngeal Material enters airway, passes BELOW cords and not ejected out despite cough attempt by patient;Material enters airway, passes BELOW cords without attempt by patient to eject out (silent aspiration);Material enters airway, passes BELOW cords then ejected out Pharyngeal- Nectar Cup Penetration/Apiration after swallow;Pharyngeal residue - pyriform;Pharyngeal residue - posterior pharnyx;Pharyngeal residue - cp segment;Inter-arytenoid space residue;Lateral channel residue;Reduced airway/laryngeal closure;Reduced epiglottic inversion;Reduced laryngeal elevation;Reduced anterior laryngeal mobility Pharyngeal Material enters airway, passes BELOW cords without attempt by patient to eject out (silent aspiration) Pharyngeal- Nectar Straw -- Pharyngeal -- Pharyngeal- Thin Teaspoon Penetration/Apiration after swallow;Pharyngeal residue - pyriform;Pharyngeal residue - posterior pharnyx;Pharyngeal residue - cp segment;Inter-arytenoid space residue;Lateral channel residue;Reduced tongue base retraction;Reduced epiglottic inversion;Penetration/Aspiration before swallow;Penetration/Aspiration during swallow;Moderate aspiration;Reduced airway/laryngeal closure;Reduced laryngeal elevation;Reduced anterior laryngeal mobility Pharyngeal Material enters airway, passes BELOW cords without attempt by patient to eject out (silent aspiration);Material enters airway, passes BELOW cords and not ejected out despite cough attempt by patient Pharyngeal- Thin Cup Pharyngeal residue - pyriform;Pharyngeal residue - posterior pharnyx;Pharyngeal residue -  valleculae;Pharyngeal residue - cp segment;Inter-arytenoid space residue;Lateral channel residue;Reduced airway/laryngeal closure;Reduced epiglottic inversion;Reduced laryngeal elevation;Reduced anterior laryngeal mobility Pharyngeal Material enters airway, passes BELOW cords without attempt by patient to eject out (silent aspiration);Material enters airway, passes BELOW cords and not ejected out despite cough attempt by patient Pharyngeal- Thin Straw -- Pharyngeal -- Pharyngeal- Puree -- Pharyngeal -- Pharyngeal- Mechanical Soft -- Pharyngeal -- Pharyngeal- Regular -- Pharyngeal -- Pharyngeal- Multi-consistency -- Pharyngeal -- Pharyngeal- Pill -- Pharyngeal -- Pharyngeal Comment --  CHL IP CERVICAL ESOPHAGEAL PHASE 07/08/2019 Cervical Esophageal Phase Impaired Pudding Teaspoon -- Pudding Cup -- Honey Teaspoon -- Honey Cup -- Nectar Teaspoon -- Nectar Cup -- Nectar Straw -- Thin Teaspoon -- Thin Cup -- Thin Straw -- Puree -- Mechanical Soft -- Regular -- Multi-consistency -- Pill -- Cervical Esophageal Comment Pt with minimal clearance of barium into esophagus due to his obstructive mass which results in gross retention of barium mixed with secretions with aspiration post=swallow. Kathleen Lime, MS [facility] SLP Acute Rehab Services Office [phone] [name] 07/08/2019, 2:47 PM               Impression/Plan: Today I had a lengthy discussion with the patient, his sister, and his son.  I talked about his work-up and his diagnosis and prognosis.  We talked about the role of radiation therapy in potentially palliating or controlling the disease in his throat and neck. They understand he has a very guarded prognosis and that radiotherapy may provide durable local regional control of his disease but there are no guarantees of treatment.  They understand that if he has a tremendous response to radiotherapy, he is still at risk for metastatic disease.  He may be considered for systemic therapy later, if his functional  status improves.  We discussed different options including hospice, a short course of palliative radiotherapy, or a longer course of radiotherapy in hopes of procuring more durable control of his primary disease.  We discussed the risks benefits and side effects of radiation regimens.  They would like to attempt a course  of radiation that may procure long-term control of the disease in his throat and neck.  We discussed a 4-week hypofractionated regimen which could cause skin irritation, fatigue, hair loss, mucositis, pain, injury or complications to normal tissues in the head and neck region.  We discussed techniques and methods to minimize the risk of serious side effects.  They understand the treatment is optional.  They are enthusiastic about proceeding.  The patient has signed a consent form and we will plan his treatment today and hope to start treatment later this week.    The patient understands that smoking cessation is important to optimize his prognosis.  We also discussed that nutrition is of the utmost importance and that he should do everything in his power to cooperate with nutritional installation through his PEG.  After our discussion, I spent time answering the family's questions.  I answered their questions to the best of my ability and made sure that they know how to contact me or our team if further questions arise in the future.  They expressed appreciation for this visit.  On date of service, in total, I spent 40 minutes on this encounter.  Patient was seen in person with his son present and his sister on speaker phone.   _________________________   Eppie Gibson, MD

## 2019-07-21 NOTE — Telephone Encounter (Signed)
Received an order to schedule a CT Chest in [place] with F/U on 08/14/2019 with Dr. Chase Caller in [place]. Since then the F/U has been canceled (See previous phone note on 07/13/2019). According to the previous phone note patient will be following up with Dr.Sherrill for lung lesion.    Do I need to cancel the CT Chest ordered by Dr. Chase Caller?  The CT Chest has not been scheduled yet due to the patient being in the hospital.   Please advise.  Thank you Catha Gosselin

## 2019-07-21 NOTE — Progress Notes (Signed)
Daily Progress Note   Patient Name: Devon Peterson       Date: 07/21/2019 DOB: 07/15/1955  Age: 64 y.o. MRN#: [id number] Attending Physician: Allie Bossier, MD Primary Care Physician: Patient, No Pcp Per Admit Date: 07/07/2019  Reason for Consultation/Follow-up: Establishing goals of care  Subjective: Chart reviewed.  I met today with Devon Peterson to review conversation from family meeting held by Dr. Rowe Pavy, who was not able to see Devon Peterson yesterday as he was downstairs in radiation oncology.  At last family meeting, Devon Peterson was going to consider any limitations of care with consideration for changing his CODE STATUS.  Devon Peterson was awake and alert during time of my encounter.  He answers questions appropriately.  He relays that he is invested in plan for continuation of aggressive interventions and wants to undergo radiation as discussed yesterday with Dr. Isidore Moos.  He is hopeful that he may be a candidate for further disease modifying therapy if his nutrition can improve over the next several weeks as well.  He indicates that he is not interested in further discussion regarding limitation of care and wants to remain a full code.  Length of Stay: 14  Current Medications: Scheduled Meds:  . albuterol  2.5 mg Nebulization Daily  . chlorhexidine  15 mL Mouth Rinse BID  . Chlorhexidine Gluconate Cloth  6 each Topical Daily  . feeding supplement (PRO-STAT SUGAR FREE 64)  30 mL Per Tube Daily  . glycopyrrolate  1 mg Per Tube BID  . mouth rinse  15 mL Mouth Rinse q12n4p  . thiamine  100 mg Per Tube Daily    Continuous Infusions: . sodium chloride 75 mL/hr at 07/21/19 1600  . feeding supplement (OSMOLITE 1.5 CAL) 1,000 mL (07/20/19 2104)    PRN  Meds: ipratropium-albuterol, labetalol, ondansetron (ZOFRAN) IV, sodium chloride flush  Physical Exam         GEN: Awake alert resting in bed, frail and cachectic ENT: Trach in place Resp: Regular, coarse throughout Cardiovascular: Regular, no murmur Abdomen: Soft, PEG in place Extremity: No edema: Clubbing noted, missing digits upper extremity  Vital Signs: BP 105/66   Pulse 81   Temp 98.1 F (  36.7 C) (Oral)   Resp 17   Ht '5\' 11"'$  (1.803 m)   Wt 44 kg   SpO2 100%   BMI 13.53 kg/m  SpO2: SpO2: 100 % O2 Device: O2 Device: Tracheostomy Collar O2 Flow Rate: O2 Flow Rate (L/min): 5 L/min  Intake/output summary:   Intake/Output Summary (Last 24 hours) at 07/21/2019 1636 Last data filed at 07/21/2019 1600 Gross per 24 hour  Intake 2579.97 ml  Output 2050 ml  Net 529.97 ml   LBM: Last BM Date: 07/14/19 Baseline Weight: Weight: 42 kg Most recent weight: Weight: 44 kg       Palliative Assessment/Data:      Patient Active Problem List   Diagnosis Date Noted  . Squamous cell carcinoma of neck   . Palliative care encounter   . Tracheostomy care ([place])   . 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 Lung mass   . Neck mass   . Pressure injury of skin 07/08/2019  . Aspiration pneumonia ([place]) 07/07/2019  . Dysphagia 07/07/2019  . Severe protein-calorie malnutrition ([place]) 07/07/2019  . Alcohol use 07/07/2019  . Sepsis ([place]) 04/17/2019  . COPD exacerbation ([place])   . Lactic acidosis   . Tobacco abuse   . 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 Frailty deconditioning and compromised functional status.  Recommendations/Plan: I met today  with Mr. Bilyk.  He was awake and alert and we discussed care plan moving forward.  Reviewed meeting with Dr. Isidore Moos and at this point he remains invested in plan for continuation of any and all aggressive interventions. -Full code/full scope -Plan for 4-week course of radiation followed by reevaluation for potential for systemic therapy  Goals of Care and Additional Recommendations:  Limitations on Scope of Treatment: Full Scope Treatment  Code Status:    Code Status Orders  (From admission, onward)         Start     Ordered   07/07/19 1957  Full code  Continuous     07/07/19 2003        Code Status History    Date Active Date Inactive Code Status Order ID Comments User Context   04/17/2019 1638 04/19/2019 1957 Full Code [id number]  Loletha Grayer, MD ED   Advance Care Planning Activity       Prognosis:   Unable to determine  Discharge Planning:  To Be Determined Likely for skilled nursing facility rehabilitation attempt with palliative services following.     Thank you for allowing the Palliative Medicine Team to assist in the care of this patient.   Time In: 1400 Time Out: 1420 Total Time 20 Prolonged Time Billed  no       Greater than 50%  of this time was spent counseling and coordinating care related to the above assessment and plan.  Micheline Rough, MD  Please contact Palliative Medicine Team phone at [phone] for questions and concerns.

## 2019-07-21 NOTE — Progress Notes (Signed)
Physical Therapy Treatment Patient Details Name: Devon Peterson MRN: [id number] DOB: [birth year] Today's Date: 07/21/2019    History of Present Illness 64 yo male admitted with aspiration pneumonia, with newly identified mass located in the hypopharynx.  Went to the operating room on 5/6 for direct laryngoscopy, biopsy, and tracheostomy.  Marland Kitchen Hx of ETOH abuse, COPD, dysphagia, polysubstance abuse, malnutrition. S/P PEG    PT Comments    The patient did participate in mobility and ambulated x 8' withRW on 28% Tc. Patient is unsteady and very weak. Continue progressive mobility.  SPO2 94%, HR 110.  Follow Up Recommendations  SNF     Equipment Recommendations  None recommended by PT    Recommendations for Other Services       Precautions / Restrictions Precautions Precaution Comments: trach on 28%(venti tube in room) TC; PEG/NPO    Mobility  Bed Mobility   Bed Mobility: Supine to Sit     Supine to sit: Min assist     General bed mobility comments: min HHA to sit up  Transfers Overall transfer level: Needs assistance Equipment used: Rolling walker (2 wheeled) Transfers: Sit to/from Stand Sit to Stand: Min assist;+2 safety/equipment         General transfer comment: stood with stedy assist,  Ambulation/Gait Ambulation/Gait assistance: Min assist;+2 safety/equipment Gait Distance (Feet): 8 Feet Assistive device: Rolling walker (2 wheeled) Gait Pattern/deviations: Step-to pattern;Trunk flexed Gait velocity: decr   General Gait Details: many lines and tubes impeding mobility, tolerated short distance to recliner.   Stairs             Wheelchair Mobility    Modified Rankin (Stroke Patients Only)       Balance   Sitting-balance support: No upper extremity supported;Feet supported Sitting balance-Leahy Scale: Good     Standing balance support: Bilateral upper extremity supported;During functional activity Standing balance-Leahy Scale: Poor                               Cognition Arousal/Alertness: Awake/alert Behavior During Therapy: Flat affect                                   General Comments: non verbal TRACH but following all commands      Exercises      General Comments        Pertinent Vitals/Pain Pain Assessment: No/denies pain    Home Living                      Prior Function            PT Goals (current goals can now be found in the care plan section) Acute Rehab PT Goals Patient Stated Goal: agreed to mobility PT Goal Formulation: With patient Time For Goal Achievement: 08/04/19 Potential to Achieve Goals: Fair    Frequency    Min 2X/week      PT Plan Current plan remains appropriate;Discharge plan needs to be updated;Frequency needs to be updated    Co-evaluation PT/OT/SLP Co-Evaluation/Treatment: Yes Reason for Co-Treatment: Complexity of the patient's impairments (multi-system involvement);For patient/therapist safety PT goals addressed during session: Mobility/safety with mobility OT goals addressed during session: ADL's and self-care      AM-PAC PT "6 Clicks" Mobility   Outcome Measure  Help needed turning from your back to your side while in a flat bed without  using bedrails?: A Little Help needed moving from lying on your back to sitting on the side of a flat bed without using bedrails?: A Little Help needed moving to and from a bed to a chair (including a wheelchair)?: A Little   Help needed to walk in hospital room?: A Lot Help needed climbing 3-5 steps with a railing? : Total 6 Click Score: 12    End of Session Equipment Utilized During Treatment: Gait belt;Oxygen Activity Tolerance: Patient tolerated treatment well Patient left: in chair;with call bell/phone within reach;with chair alarm set Nurse Communication: Mobility status PT Visit Diagnosis: Muscle weakness (generalized) (M62.81);Difficulty in walking, not elsewhere classified  (R26.2)     Time: 5638-7564 PT Time Calculation (min) (ACUTE ONLY): 27 min  Charges:  $Gait Training: 8-22 mins                     [place] Pager [phone] Office [phone]    Claretha Cooper 07/21/2019, 1:33 PM

## 2019-07-22 LAB — MAGNESIUM
Magnesium: 1.4 mg/dL — ABNORMAL LOW (ref 1.7–2.4)
Magnesium: 1.2 mg/dL — ABNORMAL LOW (ref 1.7–2.4)

## 2019-07-22 LAB — GLUCOSE, CAPILLARY
Glucose-Capillary: 108 mg/dL — ABNORMAL HIGH (ref 70–99)
Glucose-Capillary: 73 mg/dL (ref 70–99)
Glucose-Capillary: 80 mg/dL (ref 70–99)
Glucose-Capillary: 89 mg/dL (ref 70–99)
Glucose-Capillary: 98 mg/dL (ref 70–99)

## 2019-07-22 LAB — COMPREHENSIVE METABOLIC PANEL
ALT: 27 U/L (ref 0–44)
AST: 39 U/L (ref 15–41)
Albumin: 1.9 g/dL — ABNORMAL LOW (ref 3.5–5.0)
Alkaline Phosphatase: 66 U/L (ref 38–126)
Anion gap: 8 (ref 5–15)
BUN: 10 mg/dL (ref 8–23)
CO2: 28 mmol/L (ref 22–32)
Calcium: 8.4 mg/dL — ABNORMAL LOW (ref 8.9–10.3)
Chloride: 97 mmol/L — ABNORMAL LOW (ref 98–111)
Creatinine, Ser: 0.43 mg/dL — ABNORMAL LOW (ref 0.61–1.24)
GFR calc Af Amer: >60 mL/min (ref >60)
GFR calc non Af Amer: >60 mL/min (ref >60)
Glucose, Bld: 106 mg/dL — ABNORMAL HIGH (ref 70–99)
Potassium: 4.3 mmol/L (ref 3.5–5.1)
Sodium: 133 mmol/L — ABNORMAL LOW (ref 135–145)
Total Bilirubin: 0.4 mg/dL (ref 0.3–1.2)
Total Protein: 4.9 g/dL — ABNORMAL LOW (ref 6.5–8.1)

## 2019-07-22 LAB — CBC WITH DIFFERENTIAL/PLATELET
Abs Immature Granulocytes: 0 10*3/uL (ref 0.00–0.07)
Basophils Absolute: 0 10*3/uL (ref 0.0–0.1)
Basophils Relative: 1 %
Eosinophils Absolute: 0 10*3/uL (ref 0.0–0.5)
Eosinophils Relative: 1 %
HCT: 26.4 % — ABNORMAL LOW (ref 39.0–52.0)
Hemoglobin: 8.9 g/dL — ABNORMAL LOW (ref 13.0–17.0)
Immature Granulocytes: 0 %
Lymphocytes Relative: 31 %
Lymphs Abs: 1.4 10*3/uL (ref 0.7–4.0)
MCH: 28.5 pg (ref 26.0–34.0)
MCHC: 33.7 g/dL (ref 30.0–36.0)
MCV: 84.6 fL (ref 80.0–100.0)
Monocytes Absolute: 0.4 10*3/uL (ref 0.1–1.0)
Monocytes Relative: 8 %
Neutro Abs: 2.8 10*3/uL (ref 1.7–7.7)
Neutrophils Relative %: 59 %
Platelets: 444 10*3/uL — ABNORMAL HIGH (ref 150–400)
RBC: 3.12 MIL/uL — ABNORMAL LOW (ref 4.22–5.81)
RDW: 13.5 % (ref 11.5–15.5)
WBC: 4.6 10*3/uL (ref 4.0–10.5)
nRBC: 0 % (ref 0.0–0.2)

## 2019-07-22 LAB — PHOSPHORUS
Phosphorus: 3.2 mg/dL (ref 2.5–4.6)
Phosphorus: 3.2 mg/dL (ref 2.5–4.6)

## 2019-07-22 MED ORDER — HEPARIN SODIUM (PORCINE) 5000 UNIT/ML IJ SOLN
5000.0000 [IU] | Freq: Three times a day (TID) | INTRAMUSCULAR | Status: DC
  Administered 2019-07-22 – 2019-07-26 (×11): 5000 [IU] via SUBCUTANEOUS
  Filled 2019-07-22 (×8): qty 1

## 2019-07-22 MED ORDER — POLYETHYLENE GLYCOL 3350 17 G PO PACK
17.0000 g | PACK | Freq: Two times a day (BID) | ORAL | Status: DC
  Administered 2019-07-22 – 2019-07-27 (×4): 17 g via ORAL
  Filled 2019-07-22 (×4): qty 1

## 2019-07-22 MED ORDER — SENNOSIDES-DOCUSATE SODIUM 8.6-50 MG PO TABS
1.0000 | ORAL_TABLET | Freq: Two times a day (BID) | ORAL | Status: DC
  Administered 2019-07-22 – 2019-08-19 (×28): 1 via ORAL
  Filled 2019-07-22 (×34): qty 1

## 2019-07-22 MED ORDER — MAGNESIUM SULFATE 4 GM/100ML IV SOLN
4.0000 g | Freq: Once | INTRAVENOUS | Status: AC
  Administered 2019-07-22: 4 g via INTRAVENOUS
  Filled 2019-07-22: qty 100

## 2019-07-22 NOTE — Progress Notes (Signed)
PROGRESS NOTE    Devon Peterson  [id number] DOB: [birth year] DOA: 07/07/2019 PCP: Patient, No Pcp Per  Brief Narrative:  HPI per Dr. Shela Leff on 07/07/19 Devon Peterson a 64 y.o.malewith medical history significant ofCOPD,polysubstance abuse (alcohol, tobacco, cocaine), hospital admission 04/17/2019-04/19/2019 for sepsis secondary to pneumonia and COPD exacerbationpresenting to the ED via EMS for evaluation of cough, shortness of breath, fatigue, and unintentional weight loss.History provided by patient and his nephew at bedside. Nephew states patient was admitted to the hospital a month ago for pneumonia and since then has continued to do poorly. He has difficulty swallowing food and has been spitting up saliva. He is coughing a lot. Patient denies fevers, chills, shortness of breath, chest pain, nausea, vomiting, abdominal pain, diarrhea, or dysuria. Nephew states patient has been smoking a pack of cigarettes daily since a very young age. He has lost a lot of weight recently. Addendum: 07/07/2019 8:40 PM. Patient also denied history of hematemesis, hematochezia, or melena.  ED Course:Tachycardic on arrival with heart rate in the 150s, appeared to be sinus rhythm. Afebrile. Slightly tachypneic. Labs showing no leukocytosis. Initial lactic acid 2.5, repeat after IV fluid pending. Hemoglobin 12.7, stable compared to labs done in February 2021. Platelet count normal. BUN 27, creatinine 0.8. Albumin 3.3. LFTs normal. Blood culture x2 pending. SARS-CoV-2 PCR test negative. Influenza panel negative. UA not suggestive of infection. Urine culture pending. Chest x-ray showing new left lower lobe airspace opacity concerning for pneumonia. CT angiogram chest negative for PE. Showing abnormal soft tissue in the hypopharynx suspicious for underlying mass. Dense bilateral lower lobe consolidation, left greater than right. Aspiration suspected given findings in the hypopharynx.  Also showing evidence of an enhancing mass within the densely consolidated left lower lobe, metastatic disease or primary neoplasm not excluded. Marked cachexia.  Patient received ceftriaxone, azithromycin, and 2 L normal saline boluses.  **Interim History He was evaluated by ENT who recommended a tracheostomy.  Tracheostomy was done on 07/10/19 and postoperatively he ended up on pressors which were weaned off.  He is also getting a tissue biopsy and medical oncology as well as radiation oncology were consulted for further evaluation.  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 Postoperatively he had hypotension and went in A. fib with RVR and started on Neo-Synephrine drip has been stopped.  Critical care was consulted for further evaluation and his hypotension is improved.    Underwent PEG tube placement on 07/14/19 and nutrition is recommending that once the PEG is placed starting the patient on Osmolite 1.520 mL/h to advance by 10 mL every 12 hours to reach a goal rate of 50 mL/h with Protostat 30 mL once daily. Underwent PMV evaluation and is doing well. Has not had a BM since 07/14/19 so will start bowel regimen. PT/OT recommending SNF still and likely will be discharged when Radiation is complete. Referral has been made to the Oncology Nurse Navigator. His Trach Tube was changed to a #6 cuffless [place]. Palliative Care was consulted and he wishes to remain a FULL CODE. Radiation Oncology evaluated and he is a candidate for Radiotherapy so has started treatments.   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 Wilburn Cornelia ENT placed tracheostomy -5/13 oncology sent PD-L1 testing on pathology. -5/13 Recommend a course ofpalliative ordefinitive radiation. Radiation oncology consult has been completed. Awaiting final recommendations. -5/13 oncology Will follow up on the lung mass and determine further work-up at a future date. -Outpatient follow-up at the cancer center -Continued thick secretions requiring frequent suctioning. -5/14Dr. Tekoa ENT upsized trach to #6 cuffless to help with secretions. -5/14 continue hypertonic saline nebulizer daily followed by albuterol nebulizer treatment -5/18 per Dr. Micheline Rough palliative care note plan for patient's care is as following;             -Full code/full scope             -Plan for 4-week course of radiation followed by reevaluation for potential for systemic  therapy LEFT lung mass -Suspicious for neoplasm either second primary or metastatic.   -Currently undergoing Radiation Treatments  -C/w PMV  COPD -See Above  Dysphagia -5/11 PEG tube placed -Osmolite 1.5 @ 20 ml/hr to advance by 10 ml every 12 hours to reach goal rate of 50 ml/hr -30 ml prostat once/day.  A. fib with RVR -Went into A. fib with RVR in the PACU and was given IV metoprolol -Heart rates have improved some but he is hypotensive and had to be placed on a Neo-Synephrine drip -We will not anticoagulate at this time but may need anticoagulation soon -5/16 currently NSR  Hypotension -Resolved  Severe protein calorie malnutrition/Underweight -Estimated body mass index is 13.53 kg/m as calculated from the following:   Height as of this encounter: '5\' 11"'$  (1.803 m).   Weight as of this encounter: 44 kg. -See Above   Generalized weakness/physical deconditioning: -PT and OTinitialevaluation recommending Home Health PTbut  re-evaluation recommending SNF now given his condition; SNF Placement pending   EtOH Abuse -Folic acid 1 mg daily -Thiamine 100 mg daily -Currently no signs or symptoms of withdrawal.  Cocaine abuse -5/5 positive cocaine -Avoid beta-blockers, although if absolutely required for control of HTN this far out would be safe  Tobacco abuse -NicoDerm patch -Counseled on need for absolute cessation  Hypoglycemia -In the setting of poor p.o. intake and was persistent -An outpatient on tube feeds should resolve -5/13 discontinue D10   -5/15 resolved  Hypokalemia -Potassium goal> 4 -K+ was 4.3 this AM -Continue to Monitor and Replete as Necessary   Hypomagnesmia -Magnesium goal> 2 -Mag Level this AM was 1.2 -Replete with IV Mag Sulfate 4 grams  -Continue to Monitor and Replete as Ncessary  Hypophosphatemia -Improved -Phos Level is now 3.2 -Continue to Monitor and Replete as Necessary   Hyponatremia Last Labs          Recent Labs  Lab 07/17/19 0348 07/17/19 1703 07/18/19 0432 07/19/19 0445 07/20/19 0344  NA 121* 121* 122* 133* 133*    -Normal saline 25m/hr -5/16 NaCl 2 g TID (hold)   Normocytic Anemia -Patient's hemoglobin/hematocit is dropping slowly now 8.9/26.4 -Check anemia panel and showed an iron level of 30, U IBC 130, TIBC 150, saturation ratios of 19%, ferritin level 421, folate level 11.5, and vitamin B12 of 281 -Continue to monitor for signs and symptoms of bleeding; currently had a lot of bleeding from his trach site to be injected with lidocaine  Stage II sacral pressure ulcer (POA  Pressure Injury 07/07/19 Sacrum Mid;Upper Stage 2 -  Partial thickness loss of dermis presenting as a shallow open injury with a red, pink wound bed without slough. 3 each small separate open areas (Active)  07/07/19 2107  Location: Sacrum  Location Orientation: Mid;Upper  Staging: Stage 2 -  Partial thickness loss of dermis presenting as a shallow open injury  with a red, pink wound bed without slough.  Wound Description (Comments): 3 each small separate open areas  Present on Admission: Yes  -Continue with Mepilex pads and frequent turning   Goals of Care 5/13 palliative care consult; patient with poorly differentiated squamous carcinoma, extremely cachectic now trach and PEG dependent.  Evaluate for change of CODE STATUS, consider hospice -- PT OT recommending SNF.  Will await palliative care recommendations -5/17 PT/OT update consult; in order to place patient per NCM need an updated consult recommending SNF; Now placed and pending SNF Placement  Constipation -Start Bowel Regimen   DVT prophylaxis: SCDs; Will add Heparin sq  Code Status: FULL CODE  Family Communication: No family present at bedside  Disposition Plan: Anticipate D/C to SNF when bed is available  Status is: Inpatient  Remains inpatient appropriate because:Inpatient level of care appropriate due to severity of illness   Dispo: The patient is from: Home              Anticipated d/c is to: SNF              Anticipated d/c date is: 1 day              Patient currently is not medically stable to d/c.  Consultants:   ENT  Medical Oncology  Radiation Oncology  PCCM/Pulmonary  Palliative Care Medicine    Procedures:  5/4 CTA chest W contrast;-negative PE. - Abnormal soft tissue in the hypopharynx, suspicious for underlying mass. CT neck or direct visual inspection recommended for further evaluation. - Dense bilateral lower lobe consolidation, LEFT >> RIGHT . Aspiration is suspected given findings in the hypopharynx. -Evidence of an enhancing mass within the densely consolidated left lower lobe, metastatic disease or primary neoplasm not excluded.  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 Redmond Baseman ENT upsized #6 cuffless trach>>>   Antimicrobials:  Anti-infectives (From admission, onward)   Start     Dose/Rate Route Frequency Ordered Stop   07/07/19 2200  ampicillin-sulbactam (UNASYN) 1.5 g in sodium chloride 0.9 % 100 mL IVPB     1.5 g 200 mL/hr over 30 Minutes Intravenous Every 6 hours 07/07/19 2051 07/14/19 1606   07/07/19 1930  metroNIDAZOLE (FLAGYL) IVPB 500 mg  Status:  Discontinued     500 mg 100 mL/hr over 60 Minutes Intravenous  Once 07/07/19 1922 07/07/19 2003   07/07/19 1800  cefTRIAXone (ROCEPHIN) 1 g in sodium chloride 0.9 % 100 mL IVPB     1 g 200 mL/hr over 30 Minutes Intravenous  Once 07/07/19 1750 07/07/19 1830   07/07/19 1800  azithromycin (ZITHROMAX) 500 mg in sodium chloride 0.9 % 250 mL IVPB     500 mg 250 mL/hr over 60 Minutes Intravenous  Once 07/07/19 1750 07/07/19 1855  Subjective: Seen and examined at bedside he denied complaints or pain.  No nausea or vomiting.  Felt okay.  No other concerns or complaints at this time and he is resting in bed comfortably.  Objective: Vitals:   07/22/19 1600 07/22/19 1700 07/22/19 1800 07/22/19 1900  BP: 127/81 121/77 116/72 113/79  Pulse: 84   80  Resp: (!) 21 (!) 25 (!) 21 (!) 22  Temp:  98.4 F (36.9 C)    TempSrc:      SpO2: 100%   99%  Weight:      Height:        Intake/Output Summary (Last 24 hours) at 07/22/2019 2005 Last data filed at 07/22/2019 1600 Gross per 24 hour  Intake 1969.75 ml  Output 2700 ml  Net -730.25 ml   Filed Weights   07/07/19 2049 07/09/19 0949 07/20/19 1600  Weight: 42 kg 42 kg 44 kg   Examination: Physical Exam:  Constitutional: Thin frail cachectic African-American male who is currently  in no acute distress and slightly disheveled but he appears calm and comfortable.  Eyes: Lids and conjunctivae normal, sclerae anicteric  ENMT: External Ears, Nose appear normal. Grossly normal hearing.  Neck: Appears normal, supple, no cervical masses, normal ROM, no appreciable thyromegaly; no JVD Respiratory: Diminished to auscultation bilaterally, no wheezing, rales, rhonchi or crackles. Normal respiratory effort and patient is not tachypenic. No accessory muscle use.  Unlabored breathing Cardiovascular: RRR, no murmurs / rubs / gallops. S1 and S2 auscultated. No carotid bruits.  Abdomen: Soft, non-tender, non-distended. No masses palpated. Bowel sounds positive.  GU: Deferred. Musculoskeletal: No clubbing / cyanosis of digits/nails. Good ROM, no contractures. Normal strength and muscle tone.  Skin: No rashes, lesions, ulcers on limited skin evaluation. No induration; Warm and dry.  Neurologic: CN 2-12 grossly intact with no focal deficits. Romberg sign and cerebellar reflexes not assessed.  Psychiatric: Normal judgment and insight. Alert and oriented x 3. Normal mood and appropriate affect.   Data Reviewed: I have personally reviewed following labs and imaging studies  CBC: Recent Labs  Lab 07/18/19 0432 07/19/19 0445 07/20/19 0344 07/21/19 1130 07/22/19 0800  WBC 5.2 5.1 5.7 4.8 4.6  NEUTROABS 3.5 3.6 4.0 3.2 2.8  HGB 8.8* 8.9* 8.5* 8.9* 8.9*  HCT 25.6* 26.4* 25.8* 26.9* 26.4*  MCV 81.8 83.3 84.9 85.1 84.6  PLT 323 353 361 408* 829*   Basic Metabolic Panel: Recent Labs  Lab 07/18/19 0432 07/18/19 0432 07/19/19 0445 07/20/19 0344 07/21/19 0614 07/21/19 1130 07/22/19 0331 07/22/19 0800  NA 122*  --  133* 133*  --  132*  --  133*  K 4.3  --  3.8 3.9  --  3.9  --  4.3  CL 91*  --  98 97*  --  95*  --  97*  CO2 22  --  29 30  --  29  --  28  GLUCOSE 98  --  95 93  --  102*  --  106*  BUN 10  --  10 9  --  9  --  10  CREATININE 0.44*  --  0.44* 0.37*  --  0.35*  --   0.43*  CALCIUM 7.4*  --  7.7* 7.9*  --  8.0*  --  8.4*  MG 1.6*   < > 1.8 1.5* 1.6*  --  1.4* 1.2*  PHOS 1.9*   < > 2.1* 2.3* 3.1  --  3.2 3.2   < > = values in this  interval not displayed.   GFR: Estimated Creatinine Clearance: 58.1 mL/min (A) (by C-G formula based on SCr of 0.43 mg/dL (L)). Liver Function Tests: Recent Labs  Lab 07/18/19 0432 07/19/19 0445 07/20/19 0344 07/21/19 1130 07/22/19 0800  AST 26 27 37 42* 39  ALT _0 ALKPHOS 55 61 65 73 66  BILITOT 0.5 0.2* 0.3 0.4 0.4  PROT 5.4* 5.2* 5.3* 5.8* 4.9*  ALBUMIN 2.1* 2.0* 2.1* 2.3* 1.9*   No results for input(s): LIPASE, AMYLASE in the last 168 hours. No results for input(s): AMMONIA in the last 168 hours. Coagulation Profile: No results for input(s): INR, PROTIME in the last 168 hours. Cardiac Enzymes: No results for input(s): CKTOTAL, CKMB, CKMBINDEX, TROPONINI in the last 168 hours. BNP (last 3 results) No results for input(s): PROBNP in the last 8760 hours. HbA1C: No results for input(s): HGBA1C in the last 72 hours. CBG: Recent Labs  Lab 07/21/19 1932 07/21/19 2325 07/22/19 0743 07/22/19 1135 07/22/19 1654  GLUCAP 84 88 73 98 89   Lipid Profile: No results for input(s): CHOL, HDL, LDLCALC, TRIG, CHOLHDL, LDLDIRECT in the last 72 hours. Thyroid Function Tests: No results for input(s): TSH, T4TOTAL, FREET4, T3FREE, THYROIDAB in the last 72 hours. Anemia Panel: No results for input(s): VITAMINB12, FOLATE, FERRITIN, TIBC, IRON, RETICCTPCT in the last 72 hours. Sepsis Labs: No results for input(s): PROCALCITON, LATICACIDVEN in the last 168 hours.  Recent Results (from the past 240 hour(s))  SARS CORONAVIRUS 2 (TAT 6-24 HRS) Nasopharyngeal Nasopharyngeal Swab     Status: None   Collection Time: 07/20/19  4:35 PM   Specimen: Nasopharyngeal Swab  Result Value Ref Range Status   SARS Coronavirus 2 NEGATIVE NEGATIVE Final    Comment: (NOTE) SARS-CoV-2 target nucleic acids are NOT  DETECTED. The SARS-CoV-2 RNA is generally detectable in upper and lower respiratory specimens during the acute phase of infection. Negative results do not preclude SARS-CoV-2 infection, do not rule out co-infections with other pathogens, and should not be used as the sole basis for treatment or other patient management decisions. Negative results must be combined with clinical observations, patient history, and epidemiological information. The expected result is Negative. Fact Sheet for Patients: SugarRoll.be Fact Sheet for Healthcare Providers: https://www.woods-mathews.com/ This test is not yet approved or cleared by the [place] FDA and  has been authorized for detection and/or diagnosis of SARS-CoV-2 by FDA under an Emergency Use Authorization (EUA). This EUA will remain  in effect (meaning this test can be used) for the duration of the COVID-19 declaration under Section 56 4(b)(1) of the Act, 21 U.S.C. section 360bbb-3(b)(1), unless the authorization is terminated or revoked sooner. Performed at [facility], [place] [address], [place], [place] [address]     RN Pressure Injury Documentation: Pressure Injury 07/07/19 Sacrum Mid;Upper Stage 2 -  Partial thickness loss of dermis presenting as a shallow open injury with a red, pink wound bed without slough. 3 each small separate open areas (Active)  07/07/19 2107  Location: Sacrum  Location Orientation: Mid;Upper  Staging: Stage 2 -  Partial thickness loss of dermis presenting as a shallow open injury with a red, pink wound bed without slough.  Wound Description (Comments): 3 each small separate open areas  Present on Admission: Yes     Estimated body mass index is 13.53 kg/m as calculated from the following:   Height as of this encounter: _1  (1.803 m).   Weight as of this encounter: 44 kg.  Malnutrition Type:  Nutrition Problem: Severe Malnutrition Etiology: chronic  illness(COPD)   Malnutrition Characteristics:  Signs/Symptoms: severe fat depletion, severe muscle depletion   Nutrition Interventions:  Interventions: Tube feeding, Prostat   Radiology Studies: No results found.  Scheduled Meds: . albuterol  2.5 mg Nebulization Daily  . chlorhexidine  15 mL Mouth Rinse BID  . Chlorhexidine Gluconate Cloth  6 each Topical Daily  . feeding supplement (PRO-STAT SUGAR FREE 64)  30 mL Per Tube Daily  . glycopyrrolate  1 mg Per Tube BID  . mouth rinse  15 mL Mouth Rinse q12n4p  . polyethylene glycol  17 g Oral BID  . senna-docusate  1 tablet Oral BID  . thiamine  100 mg Per Tube Daily   Continuous Infusions: . sodium chloride 75 mL/hr at 07/22/19 1600  . feeding supplement (OSMOLITE 1.5 CAL) 1,000 mL (07/22/19 0020)    LOS: 15 days   Kerney Elbe, DO [facility] PAGER is on AMION  If 7PM-7AM, please contact night-coverage [email or web address]

## 2019-07-22 NOTE — TOC Progression Note (Signed)
Transition of Care [facility]) - Progression Note    Patient Details  Name: Devon Peterson MRN: [id number] Date of Birth: September 18, 1955  [facility] of [facility]) CM/SW Contact  Leeroy Cha, RN Phone Number: 07/22/2019, 10:27 AM  Clinical Narrative:    Remains on trache collar at 28%,is alert and reactive to stimuli, 4 weeks of rt planned, snf placement pending.   Expected Discharge Plan: [place] Barriers to Discharge: Continued Medical Work up  Expected Discharge Plan and Services Expected Discharge Plan: [place] Acute Care Choice: [place]                                         Social Determinants of Health (SDOH) Interventions    Readmission Risk Interventions No flowsheet data found.

## 2019-07-22 NOTE — Progress Notes (Signed)
Physical Therapy Treatment Patient Details Name: Devon Peterson MRN: [id number] DOB: January 28, 1956 Today's Date: 07/22/2019    History of Present Illness 64 yo male admitted with aspiration pneumonia, with newly identified mass located in the hypopharynx.  Went to the operating room on 5/6 for direct laryngoscopy, biopsy, and tracheostomy.  Marland Kitchen Hx of ETOH abuse, COPD, dysphagia, polysubstance abuse, malnutrition. S/P PEG    PT Comments    Josede is communicating with writing today. States the chair is hard. Placed a pillow and an air cushion for comfort. Patient ambulated x 8' with RW. Continue progressive mobility.  Follow Up Recommendations  SNF     Equipment Recommendations  None recommended by PT    Recommendations for Other Services       Precautions / Restrictions Precautions Precautions: Fall Precaution Comments: trach on 28%(venti line  in room) TC; PEG/NPO    Mobility  Bed Mobility   Bed Mobility: Supine to Sit     Supine to sit: Min assist     General bed mobility comments: min HHA to sit up  Transfers Overall transfer level: Needs assistance Equipment used: Rolling walker (2 wheeled) Transfers: Sit to/from Stand Sit to Stand: Min assist;+2 safety/equipment;+2 physical assistance         General transfer comment: pt requires steadying assist  Ambulation/Gait Ambulation/Gait assistance: Min assist;+2 safety/equipment Gait Distance (Feet): 8 Feet Assistive device: Rolling walker (2 wheeled) Gait Pattern/deviations: Step-to pattern;Trunk flexed     General Gait Details: many lines and tubes impeding mobility, tolerated short distance to recliner.   Stairs             Wheelchair Mobility    Modified Rankin (Stroke Patients Only)       Balance Overall balance assessment: Needs assistance Sitting-balance support: No upper extremity supported;Feet supported Sitting balance-Leahy Scale: Good     Standing balance support: Bilateral upper  extremity supported;During functional activity   Standing balance comment: reliant on RW                            Cognition   Behavior During Therapy: Flat affect                                   General Comments: writing notes_the chair is hard      Exercises      General Comments        Pertinent Vitals/Pain Pain Assessment: No/denies pain    Home Living                      Prior Function            PT Goals (current goals can now be found in the care plan section) Progress towards PT goals: Progressing toward goals    Frequency    Min 2X/week      PT Plan Current plan remains appropriate;Discharge plan needs to be updated;Frequency needs to be updated    Co-evaluation              AM-PAC PT "6 Clicks" Mobility   Outcome Measure  Help needed turning from your back to your side while in a flat bed without using bedrails?: A Little Help needed moving from lying on your back to sitting on the side of a flat bed without using bedrails?: A Little Help needed moving to and from a  bed to a chair (including a wheelchair)?: A Little Help needed standing up from a chair using your arms (e.g., wheelchair or bedside chair)?: A Lot Help needed to walk in hospital room?: A Lot Help needed climbing 3-5 steps with a railing? : Total 6 Click Score: 14    End of Session Equipment Utilized During Treatment: Gait belt;Oxygen Activity Tolerance: Patient tolerated treatment well Patient left: in chair;with call bell/phone within reach;with chair alarm set Nurse Communication: Mobility status PT Visit Diagnosis: Muscle weakness (generalized) (M62.81);Difficulty in walking, not elsewhere classified (R26.2)     Time: 8372-9021 PT Time Calculation (min) (ACUTE ONLY): 29 min  Charges:  $Therapeutic Activity: 23-37 mins                     Tresa Endo PT Acute Rehabilitation Services Pager [phone] Office  [phone]    Claretha Cooper 07/22/2019, 1:48 PM

## 2019-07-22 NOTE — Progress Notes (Signed)
Chaplain went over Advanced directives with patient.  He wants his sister to be the decision maker.  Filled out forms. Ready to be signed with notary and witnesses.

## 2019-07-22 NOTE — Progress Notes (Signed)
  Speech Language Pathology Treatment: [name] Speaking valve  Patient Details Name: Devon Peterson MRN: [id number] DOB: 08-20-1955 Today's Date: 07/22/2019 Time: 8315-1761 SLP Time Calculation (min) (ACUTE ONLY): 25 min  Assessment / Plan / Recommendation Clinical Impression  Pt was seen at bedside for trial of PMSV placement. RN reports secretions are better today. RRT reported just suctioning pt. Pt was received seated in recliner, awake. Lurline Idol is now #6 cuffless. Pt able to produce phonation with finger occlusion. Valve placed for 20 minutes. Pt exhibited wet/hoarse voice quality. Intelligibility reduced, largely due to rapid rate of speech. Intelligibility improves with decreased speech rate. Pt requested access to a phone, then made a phone call and spoke for several minutes. Sats remained steady. Pt requested valve be removed so he could clear secretions. SLP provided education re: ability to bring secretions up to the oral cavity for removal, however, pt insisted the valve be removed. No back pressure noted upon removal of valve.    Recommend pt wear the PMSV as often as possible during waking hours, with full supervision at this time due to variable secretions. Precautions posted at [facility]. Valve left on tether, attached to trach collar, but not in place. RN informed. PO trials not given at this time, due to wet voice quality. SLP will continue to follow pt acutely for increasing tolerance of PMSV, and continuing with PO trials as appropriate.    HPI HPI: 64 yo with progressive dysphagia, appears cachetic, found to have likely aspiration pna per CT - enhancing mass LLL- suspected aspiration.  Imaging studies also concerning for soft tissue mass in hypopharynx. Pt has been a consumer of ETOH - beer and smokes cigarettes. CT neck showed "Enhancing hypopharyngeal soft tissue in the post cricoid region extending superiorly with effacement of the pyriform sinuses and thickening of the aryepiglottic  folds."  Pt desired to eat/drink and swallow evaluation had been ordered. SLP advised an MBS to view impact of mass on pharyngeal swallow. This was completed 07/08/10, prior to trach placement, with recommendation for NPO except ice chips after oral care.      SLP Plan  Continue with current plan of care          Patient may use Passy-Muir Speech Valve: Intermittently with supervision;During all therapies with supervision;During all waking hours (remove during sleep);Caregiver trained to provide supervision PMSV Supervision: Full         Oral Care Recommendations: Oral care QID Follow up Recommendations: Other (comment)(TBD) SLP Visit Diagnosis: Aphonia (R49.1) Plan: Continue with current plan of care       [place]. Quentin Ore, [facility], [place] Speech Language Pathologist Office: [phone] Pager: [phone]  Shonna Chock 07/22/2019, 3:59 PM

## 2019-07-23 ENCOUNTER — Inpatient Hospital Stay (HOSPITAL_COMMUNITY): Payer: Medicaid Other

## 2019-07-23 ENCOUNTER — Ambulatory Visit
Admit: 2019-07-23 | Discharge: 2019-07-23 | Disposition: A | Discharge status: Home / Self Care | Payer: Medicaid Other | Attending: Radiation Oncology | Admitting: Radiation Oncology

## 2019-07-23 DIAGNOSIS — K5900 Constipation, unspecified: Secondary | ICD-10-CM

## 2019-07-23 LAB — COMPREHENSIVE METABOLIC PANEL
ALT: 28 U/L (ref 0–44)
AST: 34 U/L (ref 15–41)
Albumin: 2.3 g/dL — ABNORMAL LOW (ref 3.5–5.0)
Alkaline Phosphatase: 68 U/L (ref 38–126)
Anion gap: 7 (ref 5–15)
BUN: 12 mg/dL (ref 8–23)
CO2: 27 mmol/L (ref 22–32)
Calcium: 7.9 mg/dL — ABNORMAL LOW (ref 8.9–10.3)
Chloride: 97 mmol/L — ABNORMAL LOW (ref 98–111)
Creatinine, Ser: 0.37 mg/dL — ABNORMAL LOW (ref 0.61–1.24)
GFR calc Af Amer: >60 mL/min (ref >60)
GFR calc non Af Amer: >60 mL/min (ref >60)
Glucose, Bld: 126 mg/dL — ABNORMAL HIGH (ref 70–99)
Potassium: 4.2 mmol/L (ref 3.5–5.1)
Sodium: 131 mmol/L — ABNORMAL LOW (ref 135–145)
Total Bilirubin: 0.3 mg/dL (ref 0.3–1.2)
Total Protein: 5.6 g/dL — ABNORMAL LOW (ref 6.5–8.1)

## 2019-07-23 LAB — GLUCOSE, CAPILLARY
Glucose-Capillary: 127 mg/dL — ABNORMAL HIGH (ref 70–99)
Glucose-Capillary: 99 mg/dL (ref 70–99)
Glucose-Capillary: 103 mg/dL — ABNORMAL HIGH (ref 70–99)
Glucose-Capillary: 90 mg/dL (ref 70–99)
Glucose-Capillary: 84 mg/dL (ref 70–99)
Glucose-Capillary: 107 mg/dL — ABNORMAL HIGH (ref 70–99)

## 2019-07-23 LAB — PHOSPHORUS: Phosphorus: 2.7 mg/dL (ref 2.5–4.6)

## 2019-07-23 LAB — CBC WITH DIFFERENTIAL/PLATELET
Abs Immature Granulocytes: 0.01 10*3/uL (ref 0.00–0.07)
Basophils Absolute: 0.1 10*3/uL (ref 0.0–0.1)
Basophils Relative: 1 %
Eosinophils Absolute: 0 10*3/uL (ref 0.0–0.5)
Eosinophils Relative: 1 %
HCT: 25.8 % — ABNORMAL LOW (ref 39.0–52.0)
Hemoglobin: 8.4 g/dL — ABNORMAL LOW (ref 13.0–17.0)
Immature Granulocytes: 0 %
Lymphocytes Relative: 33 %
Lymphs Abs: 1.5 10*3/uL (ref 0.7–4.0)
MCH: 27.6 pg (ref 26.0–34.0)
MCHC: 32.6 g/dL (ref 30.0–36.0)
MCV: 84.9 fL (ref 80.0–100.0)
Monocytes Absolute: 0.4 10*3/uL (ref 0.1–1.0)
Monocytes Relative: 8 %
Neutro Abs: 2.6 10*3/uL (ref 1.7–7.7)
Neutrophils Relative %: 57 %
Platelets: 361 10*3/uL (ref 150–400)
RBC: 3.04 MIL/uL — ABNORMAL LOW (ref 4.22–5.81)
RDW: 13.5 % (ref 11.5–15.5)
WBC: 4.6 10*3/uL (ref 4.0–10.5)
nRBC: 0 % (ref 0.0–0.2)

## 2019-07-23 LAB — MAGNESIUM: Magnesium: 2.1 mg/dL (ref 1.7–2.4)

## 2019-07-23 NOTE — Progress Notes (Signed)
  Speech Language Pathology Treatment: Dysphagia;Passy Muir Speaking valve  Patient Details Name: Devon Peterson MRN: [id number] DOB: [birth year] Today's Date: 07/23/2019 Time: 5465-0354 SLP Time Calculation (min) (ACUTE ONLY): 22 min  Assessment / Plan / Recommendation Clinical Impression  Devon Peterson participated in speech/voice and dysphagia treatment. Per discussion with RN, pt has been donning/doffing PMSV independently in room without difficulty. Devon Peterson is cuffless. He was upright in bed watching tv with valve in place upon SLP arrival. Pt was able to recall precautions of taking it off when he goes to sleep. He was also noted to take it off when he coughed and was educated that he may have more productive cough if he leaves it on. He was educated on donning/doffing and need to wash hands prior to touching valve/trach. He nodded head in agreement. Voice remains very low in intensity and pitch and is harsh. Given cues to take a deep breath, pt was able to increase intensity for speech and improved intelligibility to 75%+ during conversation. He reports he talks on the phone well without much need for repetition. Dysphagia treatment was initiated with pt shaking his head at the beginning. Upon asking why, he stated, "there's no point." Pt appears discouraged regarding swallow function. He participated in effortful swallows x30 and chin tuck against resistance x10 during this session. Pt does not have the arm strength to do CTAR against his own fist, so SLP rolled up wash cloths and had him utilize these for exercise. He did well with this. Pt had cough response and wet vocal quality after ice chip trials.   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 Continue use of PMSV during waking hours.   HPI HPI: HPI: 64 yo with progressive dysphagia,  appears cachetic, found to have likely aspiration pna per CT - enhancing mass LLL- suspected aspiration.  Imaging studies also concerning for soft tissue mass in hypopharynx. Pt has been a consumer of ETOH - beer and smokes cigarettes. CT neck showed "Enhancing hypopharyngeal soft tissue in the post cricoid region extending superiorly with effacement of the pyriform sinuses and thickening of the aryepiglottic folds."  Pt desired to eat/drink and swallow evaluation had been ordered. SLP advised an MBS to view impact of mass on pharyngeal swallow. This was completed 64/5/12, prior to trach placement, with recommendation for NPO except ice chips after oral care.      SLP Plan  Continue with current plan of care       Recommendations  Diet recommendations: NPO Liquids provided via: Teaspoon Medication Administration: Via alternative means      Patient may use Passy-Muir Speech Valve: During all waking hours (remove during sleep) PMSV Supervision: Intermittent         Oral Care Recommendations: Oral care QID;Oral care prior to ice chip/H20 SLP Visit Diagnosis: Dysphagia, oropharyngeal phase (R13.12);Aphonia (R49.1) Plan: Continue with current plan of care                     Yosef Krogh P. Haeleigh Streiff, M.S., [place] Pathologist Acute Rehabilitation Services Pager: [place] 07/23/2019, 1:24 PM

## 2019-07-23 NOTE — Progress Notes (Signed)
PROGRESS NOTE    Sir Mallis  [id number] DOB: [birth year] DOA: 07/07/2019 PCP: Patient, No Pcp Per  Brief Narrative:  HPI per Dr. Shela Leff on 07/07/19 Devon Peterson a 64 y.o.malewith medical history significant ofCOPD,polysubstance abuse (alcohol, tobacco, cocaine), hospital admission 04/17/2019-04/19/2019 for sepsis secondary to pneumonia and COPD exacerbationpresenting to the ED via EMS for evaluation of cough, shortness of breath, fatigue, and unintentional weight loss.History provided by patient and his nephew at bedside. Nephew states patient was admitted to the hospital a month ago for pneumonia and since then has continued to do poorly. He has difficulty swallowing food and has been spitting up saliva. He is coughing a lot. Patient denies fevers, chills, shortness of breath, chest pain, nausea, vomiting, abdominal pain, diarrhea, or dysuria. Nephew states patient has been smoking a pack of cigarettes daily since a very young age. He has lost a lot of weight recently. Addendum: 07/07/2019 8:40 PM. Patient also denied history of hematemesis, hematochezia, or melena.  ED Course:Tachycardic on arrival with heart rate in the 150s, appeared to be sinus rhythm. Afebrile. Slightly tachypneic. Labs showing no leukocytosis. Initial lactic acid 2.5, repeat after IV fluid pending. Hemoglobin 12.7, stable compared to labs done in February 2021. Platelet count normal. BUN 27, creatinine 0.8. Albumin 3.3. LFTs normal. Blood culture x2 pending. SARS-CoV-2 PCR test negative. Influenza panel negative. UA not suggestive of infection. Urine culture pending. Chest x-ray showing new left lower lobe airspace opacity concerning for pneumonia. CT angiogram chest negative for PE. Showing abnormal soft tissue in the hypopharynx suspicious for underlying mass. Dense bilateral lower lobe consolidation, left greater than right. Aspiration suspected given findings in the hypopharynx.  Also showing evidence of an enhancing mass within the densely consolidated left lower lobe, metastatic disease or primary neoplasm not excluded. Marked cachexia.  Patient received ceftriaxone, azithromycin, and 2 L normal saline boluses.  **Interim History He was evaluated by ENT who recommended a tracheostomy.  Tracheostomy was done on 07/10/19 and postoperatively he ended up on pressors which were weaned off.  He is also getting a tissue biopsy and medical oncology as well as radiation oncology were consulted for further evaluation.  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 Postoperatively he had hypotension and went in A. fib with RVR and started on Neo-Synephrine drip has been stopped.  Critical care was consulted for further evaluation and his hypotension is improved.    Underwent PEG tube placement on 07/14/19 and nutrition is recommending that once the PEG is placed starting the patient on Osmolite 1.520 mL/h to advance by 10 mL every 12 hours to reach a goal rate of 50 mL/h with Protostat 30 mL once daily. Underwent PMV evaluation and is doing well. Has not had a BM since 07/14/19 so will start bowel regimen. PT/OT recommending SNF still and likely will be discharged when bed is available and is accepted.  Per my discussion with the transition of care manager placement may be an issue due to his tracheostomy status and given his Medicaid pending insurance.. Referral has been made to the Oncology Nurse Navigator. His Trach Tube was changed to a #6 cuffless [place] during this hospitalization as well. Palliative Care was consulted and he wishes to remain a FULL CODE. Radiation Oncology evaluated and he is a candidate for Radiotherapy so has started treatments and underwent first treatment today  Assessment & Plan:   Principal Problem:   Aspiration pneumonia ([place]) Active Problems:   Sepsis ([place])   Dysphagia   Severe protein-calorie  malnutrition ([place])   Alcohol use   Pressure  injury of skin   Lung mass   Neck mass   Tracheostomy care ([place])   Squamous cell carcinoma of neck   Palliative care encounter  Sepsis 2/2 to Aspiration Pneumonia -Completed course of antibiotics -C/w Albuterol neb TID -Robinul IV 0.1 mg BID -DuoNeb PRN -Repeat CXR showed "Persistent bilateral pleural effusions and bibasilar atelectasis. 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 Wilburn Cornelia ENT placed tracheostomy -5/13 oncology sent PD-L1 testing on pathology. -5/13 Recommend a course ofpalliative ordefinitive radiation. Radiation oncology consult has been completed. Awaiting final recommendations. -5/13 oncology Will follow up on the lung mass and determine further work-up at a future date. -Outpatient follow-up at the cancer center -Continued thick secretions requiring frequent suctioning. -5/14Dr. [place] ENT upsized trach to #6 cuffless to help with secretions. -5/14 continue hypertonic saline nebulizer daily followed by albuterol nebulizer treatment -5/18 per Dr. Micheline Rough palliative care note plan for patient's care is as following;             -Full code/full scope             -Plan for 4-week course of radiation followed by reevaluation for potential for systemic  therapy LEFT lung mass -Suspicious for neoplasm either second primary or metastatic.   -Currently undergoing Radiation Treatments and underwent radiation treatment today -C/w PMV and continue with speech therapy  COPD -See Above  Dysphagia -5/11 PEG tube placed -Osmolite 1.5 @ 20 ml/hr to advance by 10 ml every 12 hours to reach goal rate of 50 ml/hr -30 ml prostat once/day.  A. fib with RVR -Went into A. fib with RVR in the PACU and was given IV metoprolol -Heart rates have improved some but he is hypotensive and had to be placed on a Neo-Synephrine drip -We  will not anticoagulate at this time but may need anticoagulation soon -5/16 currently NSR  Hypotension -Resolved  Severe protein calorie malnutrition/Underweight -Estimated body mass index is 13.53 kg/m as calculated from the following:   Height as of this encounter: '5\' 11"'$  (1.803 m).   Weight as of this encounter: 44 kg. -See Above   Generalized weakness/physical deconditioning: -PT and OTinitialevaluation recommending Home Health PTbut re-evaluation recommending SNF now given his condition; SNF Placement pending   EtOH Abuse -Folic acid 1 mg daily -Thiamine 100 mg daily -Currently no signs or symptoms of withdrawal.  Cocaine abuse -5/5 positive cocaine -Avoid beta-blockers, although if absolutely required for control of HTN this far out would be safe  Tobacco abuse -NicoDerm patch -Counseled on need for absolute cessation  Hypoglycemia -In the setting of poor p.o. intake and was persistent -An outpatient on tube feeds should resolve -5/13 discontinue D10   -5/15 resolved  Hypokalemia -Potassium goal> 4 -K+ was 4.2 this AM -Continue to Monitor and Replete as Necessary   Hypomagnesmia -Magnesium goal> 2 -Mag Level yesterday AM was 1.2 and today it is 2.1 -Replete with IV Mag Sulfate 4 grams yesterday  -Continue to Monitor and Replete as Ncessary  Hypophosphatemia -Improved -Phos Level is now 2.7 -Continue to Monitor and Replete as Necessary   Hyponatremia -Normal saline 87m/hr will now be D/C'd  -Na+has been fluctuating and was 132 -> 133 -> 131 -5/16 NaCl 2 g TID (hold)  Normocytic Anemia -Patient's hemoglobin/hematocit is dropping slowly now 8.4/25.8 -Check anemia panel and showed an iron level of  30, U IBC 130, TIBC 150, saturation ratios of 19%, ferritin level 421, folate level 11.5, and vitamin B12 of 281 -Continue to monitor for signs and symptoms of bleeding; currently had a lot of bleeding from his trach site to be injected with  lidocaine  Stage II sacral pressure ulcer (POA Pressure Injury 07/07/19 Sacrum Mid;Upper Stage 2 -  Partial thickness loss of dermis presenting as a shallow open injury with a red, pink wound bed without slough. 3 each small separate open areas (Active)  07/07/19 2107  Location: Sacrum  Location Orientation: Mid;Upper  Staging: Stage 2 -  Partial thickness loss of dermis presenting as a shallow open injury with a red, pink wound bed without slough.  Wound Description (Comments): 3 each small separate open areas  Present on Admission: Yes  -Continue with Mepilex pads and frequent turning   Goals of Care 5/13 palliative care consult; patient with poorly differentiated squamous carcinoma, extremely cachectic now trach and PEG dependent.  Evaluate for change of CODE STATUS, consider hospice -- PT OT recommending SNF.  Will await palliative care recommendations -5/17 PT/OT update consult; in order to place patient per NCM need an updated consult recommending SNF; Now done and still Pending SNF Placement   Constipation -Start Bowel Regimen and this is improved as he is having bowel movements now   DVT prophylaxis: SCDs; Will add Heparin sq  Code Status: FULL CODE  Family Communication: No family present at bedside  Disposition Plan: Anticipate D/C to SNF when bed is available  Status is: Inpatient  Remains inpatient appropriate because:Inpatient level of care appropriate due to severity of illness   Dispo: The patient is from: Home              Anticipated d/c is to: SNF when bed is available               Anticipated d/c date is: 1 day              Patient currently is not medically stable to d/c.  Consultants:   ENT  Medical Oncology  Radiation Oncology  PCCM/Pulmonary  Palliative Care Medicine    Procedures:  5/4 CTA chest W contrast;-negative PE. - Abnormal soft tissue in the hypopharynx, suspicious for underlying mass. CT neck or direct visual inspection  recommended for further evaluation. - Dense bilateral lower lobe consolidation, LEFT >> RIGHT . Aspiration is suspected given findings in the hypopharynx. -Evidence of an enhancing mass within the densely consolidated left lower lobe, metastatic disease or primary neoplasm not excluded.  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 Redmond Baseman ENT upsized #6 cuffless trach>>>   Antimicrobials:  Anti-infectives (From admission, onward)   Start     Dose/Rate Route Frequency Ordered Stop   07/07/19 2200  ampicillin-sulbactam (UNASYN) 1.5 g in sodium chloride 0.9 % 100 mL IVPB     1.5 g 200 mL/hr over 30 Minutes Intravenous Every 6 hours 07/07/19 2051 07/14/19 1606   07/07/19 1930  metroNIDAZOLE (FLAGYL) IVPB 500 mg  Status:  Discontinued     500 mg 100 mL/hr over 60 Minutes Intravenous  Once 07/07/19 1922 07/07/19 2003  07/07/19 1800  cefTRIAXone (ROCEPHIN) 1 g in sodium chloride 0.9 % 100 mL IVPB     1 g 200 mL/hr over 30 Minutes Intravenous  Once 07/07/19 1750 07/07/19 1830   07/07/19 1800  azithromycin (ZITHROMAX) 500 mg in sodium chloride 0.9 % 250 mL IVPB     500 mg 250 mL/hr over 60 Minutes Intravenous  Once 07/07/19 1750 07/07/19 1855     Subjective: Seen and examined at bedside he denies any complaints or pain.  Feels okay.  No nausea or vomiting.  States that he still coughing up some secretions.  Had his Passy-Muir valve in and able to communicate well.  Objective: Vitals:   07/23/19 1300 07/23/19 1400 07/23/19 1500 07/23/19 1503  BP:  120/81 108/73 121/84 121/84  Pulse:    88  Resp: (!) 21 20 (!) 25 18  Temp:      TempSrc:      SpO2:    100%  Weight:      Height:        Intake/Output Summary (Last 24 hours) at 07/23/2019 1602 Last data filed at 07/23/2019 1200 Gross per 24 hour  Intake 2382.79 ml  Output 3450 ml  Net -1067.21 ml   Filed Weights   07/07/19 2049 07/09/19 0949 07/20/19 1600  Weight: 42 kg 42 kg 44 kg   Examination: Physical Exam:  Constitutional: Frail cachectic and thin AAM in NAD and appears calm and comfortable Eyes: Lids and conjunctivae normal, sclerae anicteric  ENMT: External Ears, Nose appear normal. Grossly normal hearing.  Neck: Appears normal, supple, no cervical masses, normal ROM, no appreciable thyromegaly; has a tracheostomy in place with a Passy-Muir valve connected to ATC Respiratory: Diminished to auscultation bilaterally, no wheezing, rales, rhonchi or crackles. Normal respiratory effort and patient is not tachypenic. No accessory muscle use.  Unlabored breathing but he is having some productive sputum Cardiovascular: RRR, no murmurs / rubs / gallops. S1 and S2 auscultated. No extremity edema. Abdomen: Soft, non-tender, non-distended.  PEG tube is in place. Bowel sounds positive.  GU: Deferred. Musculoskeletal: No clubbing / cyanosis of digits/nails. No joint deformity upper and lower extremities.  Skin: No rashes, lesions, ulcers on limited skin evaluation. No induration; Warm and dry.  Neurologic: CN 2-12 grossly intact with no focal deficits. Romberg sign and cerebellar reflexes not assessed.  Psychiatric: Normal judgment and insight. Alert and oriented x 3. Normal mood and appropriate affect.   Data Reviewed: I have personally reviewed following labs and imaging studies  CBC: Recent Labs  Lab 07/19/19 0445 07/20/19 0344 07/21/19 1130 07/22/19 0800 07/23/19 0337  WBC 5.1 5.7 4.8 4.6 4.6  NEUTROABS 3.6 4.0 3.2 2.8 2.6  HGB 8.9* 8.5* 8.9* 8.9* 8.4*  HCT 26.4* 25.8*  26.9* 26.4* 25.8*  MCV 83.3 84.9 85.1 84.6 84.9  PLT 353 361 408* 444* 998   Basic Metabolic Panel: Recent Labs  Lab 07/19/19 0445 07/19/19 0445 07/20/19 0344 07/21/19 0614 07/21/19 1130 07/22/19 0331 07/22/19 0800 07/23/19 0337  NA 133*  --  133*  --  132*  --  133* 131*  K 3.8  --  3.9  --  3.9  --  4.3 4.2  CL 98  --  97*  --  95*  --  97* 97*  CO2 29  --  30  --  29  --  28 27  GLUCOSE 95  --  93  --  102*  --  106* 126*  BUN 10  --  9  --  9  --  10 12  CREATININE 0.44*  --  0.37*  --  0.35*  --  0.43* 0.37*  CALCIUM 7.7*  --  7.9*  --  8.0*  --  8.4* 7.9*  MG 1.8   < > 1.5* 1.6*  --  1.4* 1.2* 2.1  PHOS 2.1*   < > 2.3* 3.1  --  3.2 3.2 2.7   < > = values in this interval not displayed.   GFR: Estimated Creatinine Clearance: 58.1 mL/min (A) (by C-G formula based on SCr of 0.37 mg/dL (L)). Liver Function Tests: Recent Labs  Lab 07/19/19 0445 07/20/19 0344 07/21/19 1130 07/22/19 0800 07/23/19 0337  AST 27 37 42* 39 34  ALT '14 22 30 27 28  '$ ALKPHOS 61 65 73 66 68  BILITOT 0.2* 0.3 0.4 0.4 0.3  PROT 5.2* 5.3* 5.8* 4.9* 5.6*  ALBUMIN 2.0* 2.1* 2.3* 1.9* 2.3*   No results for input(s): LIPASE, AMYLASE in the last 168 hours. No results for input(s): AMMONIA in the last 168 hours. Coagulation Profile: No results for input(s): INR, PROTIME in the last 168 hours. Cardiac Enzymes: No results for input(s): CKTOTAL, CKMB, CKMBINDEX, TROPONINI in the last 168 hours. BNP (last 3 results) No results for input(s): PROBNP in the last 8760 hours. HbA1C: No results for input(s): HGBA1C in the last 72 hours. CBG: Recent Labs  Lab 07/22/19 2007 07/22/19 2348 07/23/19 0352 07/23/19 0802 07/23/19 1146  GLUCAP 80 108* 127* 99 103*   Lipid Profile: No results for input(s): CHOL, HDL, LDLCALC, TRIG, CHOLHDL, LDLDIRECT in the last 72 hours. Thyroid Function Tests: No results for input(s): TSH, T4TOTAL, FREET4, T3FREE, THYROIDAB in the last 72 hours. Anemia Panel: No  results for input(s): VITAMINB12, FOLATE, FERRITIN, TIBC, IRON, RETICCTPCT in the last 72 hours. Sepsis Labs: No results for input(s): PROCALCITON, LATICACIDVEN in the last 168 hours.  Recent Results (from the past 240 hour(s))  SARS CORONAVIRUS 2 (TAT 6-24 HRS) Nasopharyngeal Nasopharyngeal Swab     Status: None   Collection Time: 07/20/19  4:35 PM   Specimen: Nasopharyngeal Swab  Result Value Ref Range Status   SARS Coronavirus 2 NEGATIVE NEGATIVE Final    Comment: (NOTE) SARS-CoV-2 target nucleic acids are NOT DETECTED. The SARS-CoV-2 RNA is generally detectable in upper and lower respiratory specimens during the acute phase of infection. Negative results do not preclude SARS-CoV-2 infection, do not rule out co-infections with other pathogens, and should not be used as the sole basis for treatment or other patient management decisions. Negative results must be combined with clinical observations, patient history, and epidemiological information. The expected result is Negative. Fact Sheet for Patients: SugarRoll.be Fact Sheet for Healthcare Providers: https://www.woods-mathews.com/ This test is not yet approved or cleared by the [place] FDA and  has been authorized for detection and/or diagnosis of SARS-CoV-2 by FDA under an Emergency Use Authorization (EUA). This EUA will remain  in effect (meaning this test can be used) for the duration of the COVID-19 declaration under Section 56 4(b)(1) of the Act, 21 U.S.C. section 360bbb-3(b)(1), unless the authorization is terminated or revoked sooner. Performed at [facility], [place] [address], [place], [place] [address]     RN Pressure Injury Documentation: Pressure Injury 07/07/19 Sacrum Mid;Upper Stage 2 -  Partial thickness loss of dermis presenting as a shallow open injury with a red, pink wound bed without slough. 3 each small separate open areas (Active)  07/07/19 2107    Location: Sacrum  Location  Orientation: Mid;Upper  Staging: Stage 2 -  Partial thickness loss of dermis presenting as a shallow open injury with a red, pink wound bed without slough.  Wound Description (Comments): 3 each small separate open areas  Present on Admission: Yes     Estimated body mass index is 13.53 kg/m as calculated from the following:   Height as of this encounter: '5\' 11"'$  (1.803 m).   Weight as of this encounter: 44 kg.  Malnutrition Type:  Nutrition Problem: Severe Malnutrition Etiology: chronic illness(COPD)   Malnutrition Characteristics:  Signs/Symptoms: severe fat depletion, severe muscle depletion   Nutrition Interventions:  Interventions: Tube feeding, Prostat   Radiology Studies: DG CHEST PORT 1 VIEW  Result Date: 07/23/2019 CLINICAL DATA:  Shortness of breath. EXAM: PORTABLE CHEST 1 VIEW COMPARISON:  Chest x-ray 07/23/2019 FINDINGS: The tracheostomy tube is stable. The cardiac silhouette, mediastinal and hilar contours are within normal limits. There are bilateral pleural effusions and bibasilar atelectasis. Much improved left lower lobe aeration with resolving pneumonia. IMPRESSION: Persistent bilateral pleural effusions and bibasilar atelectasis. Much improved left lower lobe lung aeration with resolving pneumonia. Electronically Signed   By: Marijo Sanes M.D.   On: 07/23/2019 08:03    Scheduled Meds: . albuterol  2.5 mg Nebulization Daily  . chlorhexidine  15 mL Mouth Rinse BID  . Chlorhexidine Gluconate Cloth  6 each Topical Daily  . feeding supplement (PRO-STAT SUGAR FREE 64)  30 mL Per Tube Daily  . glycopyrrolate  1 mg Per Tube BID  . heparin injection (subcutaneous)  5,000 Units Subcutaneous Q8H  . mouth rinse  15 mL Mouth Rinse q12n4p  . polyethylene glycol  17 g Oral BID  . senna-docusate  1 tablet Oral BID  . thiamine  100 mg Per Tube Daily   Continuous Infusions: . sodium chloride 75 mL/hr at 07/23/19 1200  . feeding supplement  (OSMOLITE 1.5 CAL) 1,000 mL (07/22/19 1900)    LOS: 16 days   Kerney Elbe, DO [facility] PAGER is on [place]  If 7PM-7AM, please contact night-coverage [email or web address]

## 2019-07-23 NOTE — TOC Progression Note (Signed)
Transition of Care [facility]) - Progression Note    Patient Details  Name: Devon Peterson MRN: [id number] Date of Birth: April 04, 1955  [facility]) CM/SW Contact  Leeroy Cha, RN Phone Number: 07/23/2019, 4:04 PM  Clinical Narrative:   tct-brian centert-Chris [place] medicaid [place] may be able to take with the trach, wcb.    Expected Discharge Plan: [place] Barriers to Discharge: Continued Medical Work up  Expected Discharge Plan and Services Expected Discharge Plan: [place] Acute Care Choice: [place]                                         Social Determinants of Health (SDOH) Interventions    Readmission Risk Interventions No flowsheet data found.

## 2019-07-24 ENCOUNTER — Ambulatory Visit
Admit: 2019-07-24 | Discharge: 2019-07-24 | Disposition: A | Discharge status: Home / Self Care | Payer: Medicaid Other | Attending: Radiation Oncology | Admitting: Radiation Oncology

## 2019-07-24 LAB — MAGNESIUM: Magnesium: 1.6 mg/dL — ABNORMAL LOW (ref 1.7–2.4)

## 2019-07-24 LAB — GLUCOSE, CAPILLARY
Glucose-Capillary: 121 mg/dL — ABNORMAL HIGH (ref 70–99)
Glucose-Capillary: 123 mg/dL — ABNORMAL HIGH (ref 70–99)
Glucose-Capillary: 129 mg/dL — ABNORMAL HIGH (ref 70–99)
Glucose-Capillary: 67 mg/dL — ABNORMAL LOW (ref 70–99)
Glucose-Capillary: 71 mg/dL (ref 70–99)
Glucose-Capillary: 94 mg/dL (ref 70–99)
Glucose-Capillary: 99 mg/dL (ref 70–99)

## 2019-07-24 LAB — COMPREHENSIVE METABOLIC PANEL
ALT: 30 U/L (ref 0–44)
AST: 36 U/L (ref 15–41)
Albumin: 2.7 g/dL — ABNORMAL LOW (ref 3.5–5.0)
Alkaline Phosphatase: 72 U/L (ref 38–126)
Anion gap: 6 (ref 5–15)
BUN: 12 mg/dL (ref 8–23)
CO2: 32 mmol/L (ref 22–32)
Calcium: 8.4 mg/dL — ABNORMAL LOW (ref 8.9–10.3)
Chloride: 97 mmol/L — ABNORMAL LOW (ref 98–111)
Creatinine, Ser: 0.41 mg/dL — ABNORMAL LOW (ref 0.61–1.24)
GFR calc Af Amer: >60 mL/min (ref >60)
GFR calc non Af Amer: >60 mL/min (ref >60)
Glucose, Bld: 82 mg/dL (ref 70–99)
Potassium: 4.6 mmol/L (ref 3.5–5.1)
Sodium: 135 mmol/L (ref 135–145)
Total Bilirubin: 0.3 mg/dL (ref 0.3–1.2)
Total Protein: 6.4 g/dL — ABNORMAL LOW (ref 6.5–8.1)

## 2019-07-24 LAB — CBC WITH DIFFERENTIAL/PLATELET
Abs Immature Granulocytes: 0.01 10*3/uL (ref 0.00–0.07)
Basophils Absolute: 0.1 10*3/uL (ref 0.0–0.1)
Basophils Relative: 1 %
Eosinophils Absolute: 0 10*3/uL (ref 0.0–0.5)
Eosinophils Relative: 1 %
HCT: 26.5 % — ABNORMAL LOW (ref 39.0–52.0)
Hemoglobin: 8.7 g/dL — ABNORMAL LOW (ref 13.0–17.0)
Immature Granulocytes: 0 %
Lymphocytes Relative: 39 %
Lymphs Abs: 1.6 10*3/uL (ref 0.7–4.0)
MCH: 28.2 pg (ref 26.0–34.0)
MCHC: 32.8 g/dL (ref 30.0–36.0)
MCV: 86 fL (ref 80.0–100.0)
Monocytes Absolute: 0.3 10*3/uL (ref 0.1–1.0)
Monocytes Relative: 8 %
Neutro Abs: 2.1 10*3/uL (ref 1.7–7.7)
Neutrophils Relative %: 51 %
Platelets: 359 10*3/uL (ref 150–400)
RBC: 3.08 MIL/uL — ABNORMAL LOW (ref 4.22–5.81)
RDW: 13.8 % (ref 11.5–15.5)
WBC: 4.1 10*3/uL (ref 4.0–10.5)
nRBC: 0 % (ref 0.0–0.2)

## 2019-07-24 LAB — PHOSPHORUS: Phosphorus: 3.3 mg/dL (ref 2.5–4.6)

## 2019-07-24 MED ORDER — DEXTROSE 50 % IV SOLN
12.5000 g | INTRAVENOUS | Status: AC
  Administered 2019-07-25: 12.5 g via INTRAVENOUS
  Filled 2019-07-24: qty 50

## 2019-07-24 MED ORDER — DEXTROSE 50 % IV SOLN
INTRAVENOUS | Status: AC
  Filled 2019-07-24: qty 50

## 2019-07-24 MED ORDER — OSMOLITE 1.5 CAL PO LIQD
237.0000 mL | Freq: Every day | ORAL | Status: DC
  Administered 2019-07-24 – 2019-07-29 (×24): 237 mL
  Filled 2019-07-24 (×32): qty 237

## 2019-07-24 MED ORDER — MAGNESIUM SULFATE 50 % IJ SOLN
3.0000 g | Freq: Once | INTRAVENOUS | Status: AC
  Administered 2019-07-24: 3 g via INTRAVENOUS
  Filled 2019-07-24: qty 6

## 2019-07-24 MED ORDER — FREE WATER
120.0000 mL | Freq: Every day | Status: DC
  Administered 2019-07-24 – 2019-07-28 (×20): 120 mL

## 2019-07-24 NOTE — Progress Notes (Signed)
  Speech Language Pathology Treatment: Dysphagia;Passy Muir Speaking valve  Patient Details Name: Devon Peterson MRN: [id number] DOB: 1956-02-17 Today's Date: 07/24/2019 Time: 1235-1300 SLP Time Calculation (min) (ACUTE ONLY): 25 min  Assessment / Plan / Recommendation Clinical Impression  Pt seen at bedside for skilled ST intervention targeting goals for PMSV care and use, and swallow safety. Upon arrival of SLP, pt had PMSV in place. During this session, pt was observed to remove the valve, suction himself, and replace the valve independently. Voice quality continues to be low in intensity and hoarse/gravelly. Pt was observed to have been eating a cookie, which he stated his son gave him. Pt completed Oral care with suction after set up, after which pt accepted ice chips. No voice quality change noted, and no reflexive cough was elicited. Multiple audible swallows observed. Pt requested sips of soda (also in the room). SLP provided sips of water instead, with multiple audible swallows and immediate reflexive cough. Pt was educated regarding the importance of continuing with dysphagia exercises and remaining NPO. He indicated that he would like to eat and drink, regardless of aspiration risk. SLP provided education regarding recent MBS results, including HIGH aspiration risk and recommendation for NPO except ice chips after oral care.   MD and RN informed.   HPI HPI: HPI: 64 yo with progressive dysphagia, appears cachetic, found to have likely aspiration pna per CT - enhancing mass LLL- suspected aspiration.  Imaging studies also concerning for soft tissue mass in hypopharynx. Pt has been a consumer of ETOH - beer and smokes cigarettes. CT neck showed "Enhancing hypopharyngeal soft tissue in the post cricoid region extending superiorly with effacement of the pyriform sinuses and thickening of the aryepiglottic folds."  Pt desired to eat/drink and swallow evaluation had been ordered. SLP advised an MBS to  view impact of mass on pharyngeal swallow. This was completed 07/08/10, prior to trach placement, with recommendation for NPO except ice chips after oral care.      SLP Plan  Continue with current plan of care       Recommendations  Diet recommendations: NPO Medication Administration: Via alternative means      Patient may use Passy-Muir Speech Valve: During all waking hours (remove during sleep) PMSV Supervision: Intermittent         Oral Care Recommendations: Oral care QID;Oral care prior to ice chip/H20 Follow up Recommendations: Other (comment)(TBD) SLP Visit Diagnosis: Dysphagia, oropharyngeal phase (R13.12);Aphonia (R49.1) Plan: Continue with current plan of care       Nesbitt. Quentin Ore, [facility], [place] Speech Language Pathologist Office: [phone] Pager: [phone]  Shonna Chock 07/24/2019, 1:12 PM

## 2019-07-24 NOTE — Progress Notes (Signed)
OT Cancellation Note  Patient Details Name: Devon Peterson MRN: [id number] DOB: 1955/05/01   Cancelled Treatment:    Reason Eval/Treat Not Completed: Patient declined, no reason specified(Patient shaking his head no when asked if would participate in therapy. When asked if he was too tired - he nodded. Communication limited by trach.)  [name] 07/24/2019, 3:33 PM

## 2019-07-24 NOTE — Progress Notes (Signed)
PROGRESS NOTE    Devon Peterson  [id number] DOB: [birth year] DOA: 07/07/2019 PCP: Patient, No Pcp Per  Brief Narrative:  HPI per Dr. Shela Leff on 07/07/19 Devon Peterson a 64 y.o.malewith medical history significant ofCOPD,polysubstance abuse (alcohol, tobacco, cocaine), hospital admission 04/17/2019-04/19/2019 for sepsis secondary to pneumonia and COPD exacerbationpresenting to the ED via EMS for evaluation of cough, shortness of breath, fatigue, and unintentional weight loss.History provided by patient and his nephew at bedside. Nephew states patient was admitted to the hospital a month ago for pneumonia and since then has continued to do poorly. He has difficulty swallowing food and has been spitting up saliva. He is coughing a lot. Patient denies fevers, chills, shortness of breath, chest pain, nausea, vomiting, abdominal pain, diarrhea, or dysuria. Nephew states patient has been smoking a pack of cigarettes daily since a very young age. He has lost a lot of weight recently. Addendum: 07/07/2019 8:40 PM. Patient also denied history of hematemesis, hematochezia, or melena.  ED Course:Tachycardic on arrival with heart rate in the 150s, appeared to be sinus rhythm. Afebrile. Slightly tachypneic. Labs showing no leukocytosis. Initial lactic acid 2.5, repeat after IV fluid pending. Hemoglobin 12.7, stable compared to labs done in February 2021. Platelet count normal. BUN 27, creatinine 0.8. Albumin 3.3. LFTs normal. Blood culture x2 pending. SARS-CoV-2 PCR test negative. Influenza panel negative. UA not suggestive of infection. Urine culture pending. Chest x-ray showing new left lower lobe airspace opacity concerning for pneumonia. CT angiogram chest negative for PE. Showing abnormal soft tissue in the hypopharynx suspicious for underlying mass. Dense bilateral lower lobe consolidation, left greater than right. Aspiration suspected given findings in the hypopharynx.  Also showing evidence of an enhancing mass within the densely consolidated left lower lobe, metastatic disease or primary neoplasm not excluded. Marked cachexia.  Patient received ceftriaxone, azithromycin, and 2 L normal saline boluses.  **Interim History He was evaluated by ENT who recommended a tracheostomy.  Tracheostomy was done on 07/10/19 and postoperatively he ended up on pressors which were weaned off.  He is also getting a tissue biopsy and medical oncology as well as radiation oncology were consulted for further evaluation.  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 Postoperatively he had hypotension and went in A. fib with RVR and started on Neo-Synephrine drip has been stopped.  Critical care was consulted for further evaluation and his hypotension is improved.    Underwent PEG tube placement on 07/14/19 and nutrition is recommending that once the PEG is placed starting the patient on Osmolite 1.520 mL/h to advance by 10 mL every 12 hours to reach a goal rate of 50 mL/h with Protostat 30 mL once daily. Underwent PMV evaluation and is doing well. Has not had a BM since 07/14/19 so will start bowel regimen. PT/OT recommending SNF still and likely will be discharged when bed is available and is accepted.  Per my discussion with the transition of care manager placement may be an issue due to his tracheostomy status and given his Medicaid pending insurance.. Referral has been made to the Oncology Nurse Navigator. His Trach Tube was changed to a #6 cuffless [place] during this hospitalization as well. Palliative Care was consulted and he wishes to remain a FULL CODE. Radiation Oncology evaluated and he is a candidate for Radiotherapy so has started treatments.  07/24/19: Nursing and speech therapy felt the patient was sneaking cookies even though he is n.p.o.  He did ask for soda and had some overt signs or symptoms of  aspiration after that when witnessed by the speech therapist.   Skilled placement is difficult.  Palliative Care to readdress goals of care if patient wants to eat today  Assessment & Plan:   Principal Problem:   Aspiration pneumonia (HCC) Active Problems:   Sepsis ([place])   Dysphagia   Severe protein-calorie malnutrition ([place])   Alcohol use   Pressure injury of skin   Lung mass   Neck mass   Tracheostomy care ([place])   Squamous cell carcinoma of neck   Palliative care encounter  Sepsis 2/2 to Aspiration Pneumonia -Completed course of antibiotics -C/w Albuterol neb TID -Robinul IV 0.1 mg BID -DuoNeb PRN -Repeat CXR showed "Persistent bilateral pleural effusions and bibasilar atelectasis. Much improved left lower lobe lung aeration with resolving pneumonia." -He had witnessed aspiration events after smoke a cookie and drank some soda -Reinforce strict n.p.o. 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 Wilburn Cornelia ENT placed tracheostomy -5/13 oncology sent PD-L1 testing on pathology. -5/13 Recommend a course ofpalliative ordefinitive radiation. Radiation oncology consult has been completed. Awaiting final recommendations. -5/13 oncology Will follow up on the lung mass and determine further work-up at a future date. -Outpatient follow-up at the cancer center -Continued thick secretions requiring frequent suctioning. -5/14Dr. Hitterdal ENT upsized trach to #6 cuffless to help with secretions. -5/14 continue hypertonic saline nebulizer daily followed by albuterol nebulizer treatment -5/18 per Dr. Micheline Rough palliative care note plan for patient's care is as following;             -Full code/full scope             -Plan for 4-week course of radiation followed by reevaluation for potential for systemic  therapy LEFT lung mass -Suspicious for neoplasm either second primary or metastatic.   -Currently undergoing Radiation  Treatments and underwent radiation treatment today -C/w PMV and continue with speech therapy  COPD -See Above  Dysphagia -5/11 PEG tube placed -Osmolite 1.5 @ 20 ml/hr to advance by 10 ml every 12 hours to reach goal rate of 50 ml/hr and now it is to be changed to bolus feedings and he will get one carton of Osmolite 1.55 times a day with 30 mL of Po stat once a day and 120 mL free water per tube feeding bolus -30 ml prostat once/day.  A. fib with RVR -Went into A. fib with RVR in the PACU and was given IV metoprolol -Heart rates have improved some but he is hypotensive and had to be placed on a Neo-Synephrine drip -We will not anticoagulate at this time; he has now on heparin subcu -5/16 currently NSR  Hypotension -Resolved  Severe protein calorie malnutrition/Underweight -Estimated body mass index is 13.53 kg/m as calculated from the following:   Height as of this encounter: '5\' 11"'$  (1.803 m).   Weight as of this encounter: 44 kg. -See Above as he is now getting bolus tube feeds  Generalized weakness/physical deconditioning: -PT and OTinitialevaluation recommending Home Health PTbut re-evaluation recommending SNF now given his condition; SNF Placement pending   EtOH Abuse -Folic acid 1 mg daily -Thiamine 100 mg daily -Currently no signs or symptoms of withdrawal.  Cocaine abuse -5/5 positive cocaine -Avoid beta-blockers, although if absolutely required for control of HTN this far out would be safe  Tobacco abuse -NicoDerm patch -Counseled on need for absolute cessation  Hypoglycemia -In the setting of poor p.o. intake and  was persistent; Became Hypoglycemic again -CBG's ranging from 67-129  -Discontinued D10  Hypokalemia -Potassium goal> 4 -K+ was 4.6 this AM -Continue to Monitor and Replete as Necessary   Hypomagnesmia -Magnesium goal> 2 -Mag Level is now 1.6 -Replete with IV Mag Sulfate 3 grams yesterday  -Continue to Monitor and Replete as  Ncessary  Hypophosphatemia -Improved -Phos Level is now 3.3 -Continue to Monitor and Replete as Necessary   Hyponatremia -Normal saline 44m/hr will now be D/C'd  -Na+has been fluctuating and was 132 -> 133 -> 131 -> 135 -5/16 NaCl 2 g TID (hold)  Normocytic Anemia -Patient's hemoglobin/hematocit is dropping slowly now 8.7/26.5 -Check anemia panel and showed an iron level of 30, U IBC 130, TIBC 150, saturation ratios of 19%, ferritin level 421, folate level 11.5, and vitamin B12 of 281 -Continue to monitor for signs and symptoms of bleeding; currently had a lot of bleeding from his trach site to be injected with lidocaine  Stage II sacral pressure ulcer (POA Pressure Injury 07/07/19 Sacrum Mid;Upper Stage 2 -  Partial thickness loss of dermis presenting as a shallow open injury with a red, pink wound bed without slough. 3 each small separate open areas (Active)  07/07/19 2107  Location: Sacrum  Location Orientation: Mid;Upper  Staging: Stage 2 -  Partial thickness loss of dermis presenting as a shallow open injury with a red, pink wound bed without slough.  Wound Description (Comments): 3 each small separate open areas  Present on Admission: Yes  -Continue with Mepilex pads and frequent turning   Goals of Care 5/13 palliative care consult; patient with poorly differentiated squamous carcinoma, extremely cachectic now trach and PEG dependent.  Evaluate for change of CODE STATUS, consider hospice -- PT OT recommending SNF.  Will await palliative care recommendations -5/17 PT/OT update consult; in order to place patient per NCM need an updated consult recommending SNF; Now done and still Pending SNF Placement   Constipation -Start Bowel Regimen and this is improved as he is having bowel movements now   DVT prophylaxis: SCDs; Will add Heparin sq  Code Status: FULL CODE: Palliative Care to Re-address GOC this weekend Family Communication: No family present at bedside   Disposition Plan: Anticipate D/C to SNF when bed is available; Currently no Bed Available   Status is: Inpatient  Remains inpatient appropriate because:Inpatient level of care appropriate due to severity of illness   Dispo: The patient is from: Home              Anticipated d/c is to: SNF when bed is available               Anticipated d/c date is: 1 day              Patient currently is not medically stable to d/c.  Consultants:   ENT  Medical Oncology  Radiation Oncology  PCCM/Pulmonary  Palliative Care Medicine    Procedures:  5/4 CTA chest W contrast;-negative PE. - Abnormal soft tissue in the hypopharynx, suspicious for underlying mass. CT neck or direct visual inspection recommended for further evaluation. - Dense bilateral lower lobe consolidation, LEFT >> RIGHT . Aspiration is suspected given findings in the hypopharynx. -Evidence of an enhancing mass within the densely consolidated left lower lobe, metastatic disease or primary neoplasm not excluded.  Laryngeal cancer -07/08/2019 CT of the neck showed enhancing hypopharyngeal soft tissue suspicious for malignancy -07/09/2019 tracheostomy placement and biopsy -07/09/2019 pathology consistent with poorly differentiated squamous cell carcinoma with basaloid  features Left lower lobe of the lung mass concerning for primary neoplasm versus metastatic disease -07/07/2019 CT angiogram of the chest 2.7 cm mass in the left lower lobe of the lung 5/14 Dr. Redmond Baseman ENT upsized #6 cuffless trach>>>   Antimicrobials:  Anti-infectives (From admission, onward)   Start     Dose/Rate Route Frequency Ordered Stop   07/07/19 2200  ampicillin-sulbactam (UNASYN) 1.5 g in sodium chloride 0.9 % 100 mL IVPB     1.5 g 200 mL/hr over 30 Minutes Intravenous Every 6 hours 07/07/19 2051 07/14/19 1606   07/07/19 1930  metroNIDAZOLE (FLAGYL) IVPB 500 mg  Status:  Discontinued     500 mg 100 mL/hr over  60 Minutes Intravenous  Once 07/07/19 1922 07/07/19 2003   07/07/19 1800  cefTRIAXone (ROCEPHIN) 1 g in sodium chloride 0.9 % 100 mL IVPB     1 g 200 mL/hr over 30 Minutes Intravenous  Once 07/07/19 1750 07/07/19 1830   07/07/19 1800  azithromycin (ZITHROMAX) 500 mg in sodium chloride 0.9 % 250 mL IVPB     500 mg 250 mL/hr over 60 Minutes Intravenous  Once 07/07/19 1750 07/07/19 1855     Subjective: Seen and examined and he is not complaining of anything and then while I was in the room he got on the phone and started speaking to his family.  He denied anything else and let me examine him but did not really talk to me then as he wanted to speak to his family.  No other concerns or plans at this time.  Objective: Vitals:   07/24/19 1200 07/24/19 1344 07/24/19 1400 07/24/19 1500  BP:  120/88 121/77 136/88  Pulse:      Resp:  17 16   Temp: 98.6 F (37 C)     TempSrc: Oral     SpO2:      Weight:      Height:        Intake/Output Summary (Last 24 hours) at 07/24/2019 1808 Last data filed at 07/24/2019 1715 Gross per 24 hour  Intake 1952 ml  Output 2425 ml  Net -473 ml   Filed Weights   07/07/19 2049 07/09/19 0949 07/20/19 1600  Weight: 42 kg 42 kg 44 kg   Examination: Physical Exam:  Constitutional: The patient is a frail cachectic and thin African-American male currently in no acute distress he appears calm and he is now speaking on the phone to his family and does not really want to engage in conversation with me Eyes: Lids and conjunctivae normal, sclerae anicteric  ENMT: External Ears, Nose appear normal. Grossly normal hearing.  Neck: Appears normal, supple, no cervical masses, normal ROM, no appreciable thyromegaly,: No JVD but he does have a tracheostomy in place with the Passy-Muir valve Respiratory: Diminished to auscultation bilaterally, no wheezing, rales, rhonchi or crackles. Normal respiratory effort and patient is not tachypenic. No accessory muscle use.  Unlabored  breathing and he is connected to ATC Cardiovascular: RRR, no murmurs / rubs / gallops. S1 and S2 auscultated. No extremity edema. Abdomen: Soft, non-tender, non-distended. Bowel sounds positive.  GU: Deferred. Musculoskeletal: No clubbing / cyanosis of digits/nails. No joint deformity upper and lower extremities. Skin: No rashes, lesions, ulcers. No induration; Warm and dry.  Neurologic: CN 2-12 grossly intact with no focal deficits. Romberg sign cerebellar reflexes not assessed.  Psychiatric: Normal judgment and insight. Alert and oriented x 3. Normal mood and appropriate affect.   Data Reviewed: I have personally reviewed following labs  and imaging studies  CBC: Recent Labs  Lab 07/20/19 0344 07/21/19 1130 07/22/19 0800 07/23/19 0337 07/24/19 1136  WBC 5.7 4.8 4.6 4.6 4.1  NEUTROABS 4.0 3.2 2.8 2.6 2.1  HGB 8.5* 8.9* 8.9* 8.4* 8.7*  HCT 25.8* 26.9* 26.4* 25.8* 26.5*  MCV 84.9 85.1 84.6 84.9 86.0  PLT 361 408* 444* 361 194   Basic Metabolic Panel: Recent Labs  Lab 07/20/19 0344 07/20/19 0344 07/21/19 0614 07/21/19 1130 07/22/19 0331 07/22/19 0800 07/23/19 0337 07/24/19 1136  NA 133*  --   --  132*  --  133* 131* 135  K 3.9  --   --  3.9  --  4.3 4.2 4.6  CL 97*  --   --  95*  --  97* 97* 97*  CO2 30  --   --  29  --  28 27 32  GLUCOSE 93  --   --  102*  --  106* 126* 82  BUN 9  --   --  9  --  '10 12 12  '$ CREATININE 0.37*  --   --  0.35*  --  0.43* 0.37* 0.41*  CALCIUM 7.9*  --   --  8.0*  --  8.4* 7.9* 8.4*  MG 1.5*   < > 1.6*  --  1.4* 1.2* 2.1 1.6*  PHOS 2.3*   < > 3.1  --  3.2 3.2 2.7 3.3   < > = values in this interval not displayed.   GFR: Estimated Creatinine Clearance: 58.1 mL/min (A) (by C-G formula based on SCr of 0.41 mg/dL (L)). Liver Function Tests: Recent Labs  Lab 07/20/19 0344 07/21/19 1130 07/22/19 0800 07/23/19 0337 07/24/19 1136  AST 37 42* 39 34 36  ALT '22 30 27 28 30  '$ ALKPHOS 65 73 66 68 72  BILITOT 0.3 0.4 0.4 0.3 0.3  PROT 5.3* 5.8*  4.9* 5.6* 6.4*  ALBUMIN 2.1* 2.3* 1.9* 2.3* 2.7*   No results for input(s): LIPASE, AMYLASE in the last 168 hours. No results for input(s): AMMONIA in the last 168 hours. Coagulation Profile: No results for input(s): INR, PROTIME in the last 168 hours. Cardiac Enzymes: No results for input(s): CKTOTAL, CKMB, CKMBINDEX, TROPONINI in the last 168 hours. BNP (last 3 results) No results for input(s): PROBNP in the last 8760 hours. HbA1C: No results for input(s): HGBA1C in the last 72 hours. CBG: Recent Labs  Lab 07/24/19 0335 07/24/19 0748 07/24/19 1121 07/24/19 1549 07/24/19 1619  GLUCAP 94 99 71 67* 129*   Lipid Profile: No results for input(s): CHOL, HDL, LDLCALC, TRIG, CHOLHDL, LDLDIRECT in the last 72 hours. Thyroid Function Tests: No results for input(s): TSH, T4TOTAL, FREET4, T3FREE, THYROIDAB in the last 72 hours. Anemia Panel: No results for input(s): VITAMINB12, FOLATE, FERRITIN, TIBC, IRON, RETICCTPCT in the last 72 hours. Sepsis Labs: No results for input(s): PROCALCITON, LATICACIDVEN in the last 168 hours.  Recent Results (from the past 240 hour(s))  SARS CORONAVIRUS 2 (TAT 6-24 HRS) Nasopharyngeal Nasopharyngeal Swab     Status: None   Collection Time: 07/20/19  4:35 PM   Specimen: Nasopharyngeal Swab  Result Value Ref Range Status   SARS Coronavirus 2 NEGATIVE NEGATIVE Final    Comment: (NOTE) SARS-CoV-2 target nucleic acids are NOT DETECTED. The SARS-CoV-2 RNA is generally detectable in upper and lower respiratory specimens during the acute phase of infection. Negative results do not preclude SARS-CoV-2 infection, do not rule out co-infections with other pathogens, and should not be used as  the sole basis for treatment or other patient management decisions. Negative results must be combined with clinical observations, patient history, and epidemiological information. The expected result is Negative. Fact Sheet for  Patients: SugarRoll.be Fact Sheet for Healthcare Providers: https://www.woods-mathews.com/ This test is not yet approved or cleared by the [place] FDA and  has been authorized for detection and/or diagnosis of SARS-CoV-2 by FDA under an Emergency Use Authorization (EUA). This EUA will remain  in effect (meaning this test can be used) for the duration of the COVID-19 declaration under Section 56 4(b)(1) of the Act, 21 U.S.C. section 360bbb-3(b)(1), unless the authorization is terminated or revoked sooner. Performed at [facility], [place] [address] [place], [place] [address]     RN Pressure Injury Documentation: Pressure Injury 07/07/19 Sacrum Mid;Upper Stage 2 -  Partial thickness loss of dermis presenting as a shallow open injury with a red, pink wound bed without slough. 3 each small separate open areas (Active)  07/07/19 2107  Location: Sacrum  Location Orientation: Mid;Upper  Staging: Stage 2 -  Partial thickness loss of dermis presenting as a shallow open injury with a red, pink wound bed without slough.  Wound Description (Comments): 3 each small separate open areas  Present on Admission: Yes     Estimated body mass index is 13.53 kg/m as calculated from the following:   Height as of this encounter: '5\' 11"'$  (1.803 m).   Weight as of this encounter: 44 kg.  Malnutrition Type:  Nutrition Problem: Severe Malnutrition Etiology: chronic illness(COPD)   Malnutrition Characteristics:  Signs/Symptoms: severe fat depletion, severe muscle depletion   Nutrition Interventions:  Interventions: Tube feeding, Prostat   Radiology Studies: DG CHEST PORT 1 VIEW  Result Date: 07/23/2019 CLINICAL DATA:  Shortness of breath. EXAM: PORTABLE CHEST 1 VIEW COMPARISON:  Chest x-ray 07/23/2019 FINDINGS: The tracheostomy tube is stable. The cardiac silhouette, mediastinal and hilar contours are within normal limits. There are bilateral  pleural effusions and bibasilar atelectasis. Much improved left lower lobe aeration with resolving pneumonia. IMPRESSION: Persistent bilateral pleural effusions and bibasilar atelectasis. Much improved left lower lobe lung aeration with resolving pneumonia. Electronically Signed   By: Marijo Sanes M.D.   On: 07/23/2019 08:03   Scheduled Meds: . albuterol  2.5 mg Nebulization Daily  . chlorhexidine  15 mL Mouth Rinse BID  . Chlorhexidine Gluconate Cloth  6 each Topical Daily  . dextrose  12.5 g Intravenous STAT  . feeding supplement (OSMOLITE 1.5 CAL)  237 mL Per Tube 5 X Daily  . feeding supplement (PRO-STAT SUGAR FREE 64)  30 mL Per Tube Daily  . free water  120 mL Per Tube 5 X Daily  . glycopyrrolate  1 mg Per Tube BID  . heparin injection (subcutaneous)  5,000 Units Subcutaneous Q8H  . mouth rinse  15 mL Mouth Rinse q12n4p  . polyethylene glycol  17 g Oral BID  . senna-docusate  1 tablet Oral BID  . thiamine  100 mg Per Tube Daily   Continuous Infusions:   LOS: 17 days   Kerney Elbe, DO [facility] PAGER is on AMION  If 7PM-7AM, please contact night-coverage [email or web address]

## 2019-07-24 NOTE — TOC Progression Note (Addendum)
Transition of Care [facility]) - Progression Note    Patient Details  Name: Devon Peterson MRN: [id number] Date of Birth: Jul 13, 1955  Transition of Care [facility]) CM/SW Contact  Leeroy Cha, RN Phone Number: 07/24/2019, 9:05 AM  Clinical Narrative:    tct-Chris Jodell Cipro at the [place] [facility], unable to take patient, [facility]-lorrie [facility] [place] if able to take. tct-Shelia with [facility] may have a trach bed come open in the next few days.  Will check and call back.  Expected Discharge Plan: [place] Barriers to Discharge: Continued Medical Work up  Expected Discharge Plan and Services Expected Discharge Plan: [place] Acute Care Choice: [place]                                         Social Determinants of Health (SDOH) Interventions    Readmission Risk Interventions No flowsheet data found.

## 2019-07-24 NOTE — Telephone Encounter (Signed)
Yes please cancel the CT chest.  Dr. Benay Spice will coordinate that

## 2019-07-24 NOTE — Telephone Encounter (Signed)
CT Chest canceled. Rhonda J Cobb

## 2019-07-24 NOTE — Progress Notes (Signed)
Nutrition Follow-up  DOCUMENTATION CODES:   Severe malnutrition in context of chronic illness, Underweight  INTERVENTION:  - will change TF regimen to bolus: 1 carton (237 ml) Osmolite 1.5 x5/day with 30 ml prostat once/day and 120 ml free water per TF bolus. - this regimen will provide 1875 kcal, 89 grams protein, and 1505 ml free water.  - diet advancement as feasible.  NUTRITION DIAGNOSIS:   Severe Malnutrition related to chronic illness(COPD) as evidenced by severe fat depletion, severe muscle depletion -ongoing  GOAL:   Patient will meet greater than or equal to 90% of their needs -met with TF regimen  MONITOR:   TF tolerance, Diet advancement, Labs, Weight trends, Skin  ASSESSMENT:   64 year old male with past medical history of COPD, polysubstance abuse, recent admission 2/12-2/14 for sepsis secondary to pneumonia and COPD exacerbation presented with SOB, fatigue, and unintentional wt loss. Family reports patient has done poorly s/p returning home from hospital, has difficulty swallowing food, spitting up saliva, and has chronic cough. CXR revealed new left lower lobe airspace opacity concerning for pneumonia, CT angiogram showed abnormal soft tissue in hypopharynx suspicious for underlying mass, and dense bilateral lower lobe consolidation.  Patient with PEG and is receiving Osmolite 1.5 @ 50 ml/hr with 30 ml prostat once/day. This regimen is providing 1900 kcal, 90 grams protein, and 914 ml free water. RN denies any issues related to TF.   Patient is laying bed with PMV in place and is able to verbalize throughout RD visit without issue, is very well understood. He denies any abdominal pain/pressure or nausea. He started radiation therapy yesterday (5/20). He asks when he will be able to start eating.     Labs reviewed; CBGs: 94, 99, 71 mg/dl. Medications reviewed; 1 packet miralax BID, 1 tablet senokot BID, 100 mg oral thiamine/day.    Diet Order:   Diet Order             Diet NPO time specified  Diet effective now              EDUCATION NEEDS:   No education needs have been identified at this time  Skin:  Skin Assessment: Skin Integrity Issues: Skin Integrity Issues:: Stage II, Incisions Stage II: mid sacrum Incisions: neck for trach (5/6)  Last BM:  5/20  Height:   Ht Readings from Last 1 Encounters:  07/09/19 _0  (1.803 m)    Weight:   Wt Readings from Last 1 Encounters:  07/20/19 44 kg    Estimated Nutritional Needs:  Kcal:  1700-1900 Protein:  80-100 Fluid:  >/= 1.7 L/day     Jarome Matin, MS, RD, LDN, CNSC Inpatient Clinical Dietitian RD pager # available in [place]  After hours/weekend pager # available in [facility]

## 2019-07-25 ENCOUNTER — Inpatient Hospital Stay (HOSPITAL_COMMUNITY): Payer: Medicaid Other

## 2019-07-25 LAB — CBC WITH DIFFERENTIAL/PLATELET
Abs Immature Granulocytes: 0.01 10*3/uL (ref 0.00–0.07)
Abs Immature Granulocytes: 0.01 10*3/uL (ref 0.00–0.07)
Basophils Absolute: 0.1 10*3/uL (ref 0.0–0.1)
Basophils Absolute: 0 10*3/uL (ref 0.0–0.1)
Basophils Relative: 1 %
Basophils Relative: 1 %
Eosinophils Absolute: 0 10*3/uL (ref 0.0–0.5)
Eosinophils Absolute: 0 10*3/uL (ref 0.0–0.5)
Eosinophils Relative: 1 %
Eosinophils Relative: 1 %
HCT: 26.3 % — ABNORMAL LOW (ref 39.0–52.0)
HCT: 25.7 % — ABNORMAL LOW (ref 39.0–52.0)
Hemoglobin: 8.8 g/dL — ABNORMAL LOW (ref 13.0–17.0)
Hemoglobin: 8.4 g/dL — ABNORMAL LOW (ref 13.0–17.0)
Immature Granulocytes: 0 %
Immature Granulocytes: 0 %
Lymphocytes Relative: 31 %
Lymphocytes Relative: 34 %
Lymphs Abs: 1.5 10*3/uL (ref 0.7–4.0)
Lymphs Abs: 1.5 10*3/uL (ref 0.7–4.0)
MCH: 28.8 pg (ref 26.0–34.0)
MCH: 28.2 pg (ref 26.0–34.0)
MCHC: 33.5 g/dL (ref 30.0–36.0)
MCHC: 32.7 g/dL (ref 30.0–36.0)
MCV: 85.9 fL (ref 80.0–100.0)
MCV: 86.2 fL (ref 80.0–100.0)
Monocytes Absolute: 0.5 10*3/uL (ref 0.1–1.0)
Monocytes Absolute: 0.5 10*3/uL (ref 0.1–1.0)
Monocytes Relative: 10 %
Monocytes Relative: 11 %
Neutro Abs: 2.8 10*3/uL (ref 1.7–7.7)
Neutro Abs: 2.4 10*3/uL (ref 1.7–7.7)
Neutrophils Relative %: 57 %
Neutrophils Relative %: 53 %
Platelets: 333 10*3/uL (ref 150–400)
Platelets: 296 10*3/uL (ref 150–400)
RBC: 3.06 MIL/uL — ABNORMAL LOW (ref 4.22–5.81)
RBC: 2.98 MIL/uL — ABNORMAL LOW (ref 4.22–5.81)
RDW: 13.8 % (ref 11.5–15.5)
RDW: 13.7 % (ref 11.5–15.5)
WBC: 4.8 10*3/uL (ref 4.0–10.5)
WBC: 4.4 10*3/uL (ref 4.0–10.5)
nRBC: 0 % (ref 0.0–0.2)
nRBC: 0 % (ref 0.0–0.2)

## 2019-07-25 LAB — MAGNESIUM: Magnesium: 2.2 mg/dL (ref 1.7–2.4)

## 2019-07-25 LAB — GLUCOSE, CAPILLARY
Glucose-Capillary: 159 mg/dL — ABNORMAL HIGH (ref 70–99)
Glucose-Capillary: 67 mg/dL — ABNORMAL LOW (ref 70–99)
Glucose-Capillary: 71 mg/dL (ref 70–99)
Glucose-Capillary: 153 mg/dL — ABNORMAL HIGH (ref 70–99)
Glucose-Capillary: 141 mg/dL — ABNORMAL HIGH (ref 70–99)
Glucose-Capillary: 129 mg/dL — ABNORMAL HIGH (ref 70–99)

## 2019-07-25 LAB — COMPREHENSIVE METABOLIC PANEL
ALT: 38 U/L (ref 0–44)
AST: 40 U/L (ref 15–41)
Albumin: 2.8 g/dL — ABNORMAL LOW (ref 3.5–5.0)
Alkaline Phosphatase: 75 U/L (ref 38–126)
Anion gap: 8 (ref 5–15)
BUN: 13 mg/dL (ref 8–23)
CO2: 30 mmol/L (ref 22–32)
Calcium: 8.7 mg/dL — ABNORMAL LOW (ref 8.9–10.3)
Chloride: 96 mmol/L — ABNORMAL LOW (ref 98–111)
Creatinine, Ser: 0.44 mg/dL — ABNORMAL LOW (ref 0.61–1.24)
GFR calc Af Amer: >60 mL/min (ref >60)
GFR calc non Af Amer: >60 mL/min (ref >60)
Glucose, Bld: 79 mg/dL (ref 70–99)
Potassium: 4.5 mmol/L (ref 3.5–5.1)
Sodium: 134 mmol/L — ABNORMAL LOW (ref 135–145)
Total Bilirubin: 0.3 mg/dL (ref 0.3–1.2)
Total Protein: 6.6 g/dL (ref 6.5–8.1)

## 2019-07-25 LAB — PHOSPHORUS: Phosphorus: 3.1 mg/dL (ref 2.5–4.6)

## 2019-07-25 MED ORDER — DEXTROSE 50 % IV SOLN: 12.5000 g | INTRAVENOUS | Status: AC

## 2019-07-25 NOTE — Progress Notes (Signed)
Occupational Therapy Treatment Patient Details Name: Devon Peterson MRN: [id number] DOB: 02/05/1956 Today's Date: 07/25/2019    History of present illness 64 yo male admitted with aspiration pneumonia, with newly identified mass located in the hypopharynx.  Went to the operating room on 5/6 for direct laryngoscopy, biopsy, and tracheostomy.  Marland Kitchen Hx of ETOH abuse, COPD, dysphagia, polysubstance abuse, malnutrition. S/P PEG   OT comments  Patient presents with poor activity tolerance and limited motivation. Patient able to perform supine to sit, stand x 2 with walker at side of bed and return to supine - with cga assist. Patient's HR up to 127 with standing. Rest break required between each stand. Patient requested to return to bed.   Follow Up Recommendations  SNF;Supervision/Assistance - 24 hour    Equipment Recommendations  Other (comment)    Recommendations for Other Services      Precautions / Restrictions         Mobility Bed Mobility               General bed mobility comments: cga to transfer to side of bed with HOB up.  Transfers       Sit to Stand: Min guard         General transfer comment: Min guard to stand with RW at side of bed x 2. HR up to 127 with just standing. o2 sat 99-100%. Patient required sitting rest break between each stand. Patient reports fatigue and wants tor return back to bed. Patient does not want to sit in recilner.    Balance                                           ADL either performed or assessed with clinical judgement   ADL                                               Vision       Perception     Praxis      Cognition                                                Exercises     Shoulder Instructions       General Comments      Pertinent Vitals/ Pain          Home Living                                          Prior  Functioning/Environment              Frequency  Min 2X/week        Progress Toward Goals  OT Goals(current goals can now be found in the care plan section)        Plan Discharge plan remains appropriate;Frequency remains appropriate    Co-evaluation          OT goals addressed during session: Other (comment)(functional mobility  and activity tolerance in preparation for ADLs.)  AM-PAC OT "6 Clicks" Daily Activity     Outcome Measure                    End of Session Equipment Utilized During Treatment: Rolling walker  OT Visit Diagnosis: Unsteadiness on feet (R26.81);Other abnormalities of gait and mobility (R26.89);Muscle weakness (generalized) (M62.81)   Activity Tolerance Patient limited by fatigue   Patient Left with call bell/phone within reach;in bed   Nurse Communication          Time: 2924-4628 OT Time Calculation (min): 20 min  Charges: OT General Charges $OT Visit: 1 Visit OT Treatments $Therapeutic Activity: 8-22 mins  Derl Barrow, OTR/L [place]  Office [phone]    Lenward Chancellor 07/25/2019, 4:35 PM

## 2019-07-25 NOTE — Progress Notes (Signed)
PROGRESS NOTE    Devon Peterson  [id number] DOB: [birth year] DOA: 07/07/2019 PCP: Patient, No Pcp Per  Brief Narrative:  HPI per Dr. Shela Leff on 07/07/19 Devon Peterson a 64 y.o.malewith medical history significant ofCOPD,polysubstance abuse (alcohol, tobacco, cocaine), hospital admission 04/17/2019-04/19/2019 for sepsis secondary to pneumonia and COPD exacerbationpresenting to the ED via EMS for evaluation of cough, shortness of breath, fatigue, and unintentional weight loss.History provided by patient and his nephew at bedside. Nephew states patient was admitted to the hospital a month ago for pneumonia and since then has continued to do poorly. He has difficulty swallowing food and has been spitting up saliva. He is coughing a lot. Patient denies fevers, chills, shortness of breath, chest pain, nausea, vomiting, abdominal pain, diarrhea, or dysuria. Nephew states patient has been smoking a pack of cigarettes daily since a very young age. He has lost a lot of weight recently. Addendum: 07/07/2019 8:40 PM. Patient also denied history of hematemesis, hematochezia, or melena.  ED Course:Tachycardic on arrival with heart rate in the 150s, appeared to be sinus rhythm. Afebrile. Slightly tachypneic. Labs showing no leukocytosis. Initial lactic acid 2.5, repeat after IV fluid pending. Hemoglobin 12.7, stable compared to labs done in February 2021. Platelet count normal. BUN 27, creatinine 0.8. Albumin 3.3. LFTs normal. Blood culture x2 pending. SARS-CoV-2 PCR test negative. Influenza panel negative. UA not suggestive of infection. Urine culture pending. Chest x-ray showing new left lower lobe airspace opacity concerning for pneumonia. CT angiogram chest negative for PE. Showing abnormal soft tissue in the hypopharynx suspicious for underlying mass. Dense bilateral lower lobe consolidation, left greater than right. Aspiration suspected given findings in the hypopharynx.  Also showing evidence of an enhancing mass within the densely consolidated left lower lobe, metastatic disease or primary neoplasm not excluded. Marked cachexia.  Patient received ceftriaxone, azithromycin, and 2 L normal saline boluses.  **Interim History He was evaluated by ENT who recommended a tracheostomy.  Tracheostomy was done on 07/10/19 and postoperatively he ended up on pressors which were weaned off.  He is also getting a tissue biopsy and medical oncology as well as radiation oncology were consulted for further evaluation.  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 Postoperatively he had hypotension and went in A. fib with RVR and started on Neo-Synephrine drip has been stopped.  Critical care was consulted for further evaluation and his hypotension is improved.    Underwent PEG tube placement on 07/14/19 and nutrition is recommending that once the PEG is placed starting the patient on Osmolite 1.520 mL/h to advance by 10 mL every 12 hours to reach a goal rate of 50 mL/h with Protostat 30 mL once daily. Underwent PMV evaluation and is doing well. Has not had a BM since 07/14/19 so will start bowel regimen. PT/OT recommending SNF still and likely will be discharged when bed is available and is accepted.  Per my discussion with the transition of care manager placement may be an issue due to his tracheostomy status and given his Medicaid pending insurance.. Referral has been made to the Oncology Nurse Navigator. His Trach Tube was changed to a #6 cuffless [place] during this hospitalization as well. Palliative Care was consulted and he wishes to remain a FULL CODE. Radiation Oncology evaluated and he is a candidate for Radiotherapy so has started treatments.  07/24/19: Nursing and speech therapy felt the patient was sneaking cookies even though he is n.p.o.  He did ask for soda and had some overt signs or symptoms of  aspiration after that when witnessed by the speech therapist.   Skilled placement is difficult.  Palliative Care to readdress goals of care if patient wants to eat   07/25/2019: Is doing okay today and denies any complaints.  I discussed with him about sneaking cookies and he understands agrees and states that he will not do it again.  Patient in SNF is still an issue but he is stable to be transferred from the stepdown unit to the medical floor currently.  Assessment & Plan:   Principal Problem:   Aspiration pneumonia ([place]) Active Problems:   Sepsis ([place])   Dysphagia   Severe protein-calorie malnutrition ([place])   Alcohol use   Pressure injury of skin   Lung mass   Neck mass   Tracheostomy care ([place])   Squamous cell carcinoma of neck   Palliative care encounter  Sepsis 2/2 to Aspiration Pneumonia -Completed course of antibiotics -C/w Albuterol neb TID -Robinul IV 0.1 mg BID -DuoNeb PRN -Repeat CXR yesterady showed "Persistent bilateral pleural effusions and bibasilar atelectasis. Much improved left lower lobe lung aeration with resolving pneumonia." -CXR today (07/25/19) showed "Stable hazy bilateral lower chest opacification correlating with pneumonia on recent chest CT. There is also layering pleural fluid." -He had witnessed aspiration events after smoke a cookie and drank some soda; reinforced that he is n.p.o. and will not be given any food and will need to watch for memory to bring him food -Reinforce strict n.p.o. 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 Wilburn Cornelia ENT placed tracheostomy -5/13 oncology sent PD-L1 testing on pathology. -5/13 Recommend a course ofpalliative ordefinitive radiation. Radiation oncology consult has been completed. Awaiting final recommendations. -5/13 oncology Will follow up on the lung mass and determine further work-up at a future date. -Outpatient  follow-up at the cancer center -Continued thick secretions requiring frequent suctioning. -5/14Dr. [place] ENT upsized trach to #6 cuffless to help with secretions. -5/14 continue hypertonic saline nebulizer daily followed by albuterol nebulizer treatment -5/18 per Dr. Micheline Rough palliative care note plan for patient's care is as following;             -Full code/full scope             -Plan for 4-week course of radiation followed by reevaluation for potential for systemic  therapy LEFT lung mass -Suspicious for neoplasm either second primary or metastatic.   -Currently undergoing Radiation Treatments and underwent radiation treatment yesterday -C/w PMV and continue with speech therapy -Placement is still difficult and he still will follow up with oncology Dr. Benay Spice in the outpatient setting  COPD -See Above  Dysphagia -5/11 PEG tube placed -Osmolite 1.5 @ 20 ml/hr to advance by 10 ml every 12 hours to reach goal rate of 50 ml/hr and now it is to be changed to bolus feedings and he will get one carton of Osmolite 1.55 times a day with 30 mL of Po stat once a day and 120 mL free water per tube feeding bolus; continue to monitor carefully now that he is on bolus feedings and has some hypoglycemia episode -30 ml prostat once/day.  A. fib with RVR -Went into A. fib with RVR in the PACU and was given IV metoprolol -Heart rates have improved some but he is hypotensive and had to be placed on a Neo-Synephrine drip -We will not anticoagulate at this  time; he has now on heparin subcu -5/16 currently NSR  Hypotension -Resolved  Severe protein calorie malnutrition/Underweight -Estimated body mass index is 13.53 kg/m as calculated from the following:   Height as of this encounter: _0  (1.803 m).   Weight as of this encounter: 44 kg. -See Above as he is now getting bolus tube feeds  Generalized weakness/physical deconditioning: -PT and OTinitialevaluation  recommending Home Health PTbut re-evaluation recommending SNF now given his condition; SNF Placement pending and he is a difficult placement  EtOH Abuse -Folic acid 1 mg daily -Thiamine 100 mg daily -Currently no signs or symptoms of withdrawal.  Cocaine abuse -5/5 Positive cocaine -Avoid beta-blockers, although if absolutely required for control of HTN this far out would be safe  Tobacco abuse -NicoDerm patch -Counseled on need for absolute cessation  Hypoglycemia -In the setting of poor p.o. intake and was persistent; Became Hypoglycemic again -CBG's ranging from 71-159 -Discontinued D10  Hypokalemia -Potassium goal> 4 -K+ was 4.5 this AM -Continue to Monitor and Replete as Necessary   Hypomagnesmia -Magnesium goal> 2 -Mag Level is now 2.2 -Continue to Monitor and Replete as Ncessary  Hypophosphatemia -Improved -Phos Level is now 3.1 -Continue to Monitor and Replete as Necessary   Hyponatremia -Normal saline 109m/hr will now be D/C'd  -Na+has been fluctuating and was 132 -> 133 -> 131 -> 135 -> 134 -5/16 NaCl 2 g TID (hold)  Normocytic Anemia -Patient's hemoglobin/hematocit is dropping slowly now 8.8/26.3 -Check anemia panel and showed an iron level of 30, U IBC 130, TIBC 150, saturation ratios of 19%, ferritin level 421, folate level 11.5, and vitamin B12 of 281 -Continue to monitor for signs and symptoms of bleeding; currently had a lot of bleeding from his trach site to be injected with lidocaine  Stage II sacral pressure ulcer (POA Pressure Injury 07/07/19 Sacrum Mid;Upper Stage 2 -  Partial thickness loss of dermis presenting as a shallow open injury with a red, pink wound bed without slough. 3 each small separate open areas (Active)  07/07/19 2107  Location: Sacrum  Location Orientation: Mid;Upper  Staging: Stage 2 -  Partial thickness loss of dermis presenting as a shallow open injury with a red, pink wound bed without slough.  Wound Description  (Comments): 3 each small separate open areas  Present on Admission: Yes  -Continue with Mepilex pads and frequent turning   Goals of Care 5/13 palliative care consult; patient with poorly differentiated squamous carcinoma, extremely cachectic now trach and PEG dependent.  Evaluate for change of CODE STATUS, consider hospice -- PT OT recommending SNF.  Will await palliative care recommendations -5/17 PT/OT update consult; in order to place patient per NCM need an updated consult recommending SNF; Now done and still Pending SNF Placement   Constipation -Start Bowel Regimen and this is improved as he is having bowel movements now -Tinea with bowel regimen and continue to monitor   DVT prophylaxis: SCDs; Will add Heparin sq  Code Status: FULL CODE: Palliative Care to Re-address GOC this weekend Family Communication: No family present at bedside  Disposition Plan: Anticipate D/C to SNF when bed is available; Currently no Bed Available and is difficult to be placed: He is stable to be transferred out of the stepdown unit to the medical floor  Status is: Inpatient  Remains inpatient appropriate because:Inpatient level of care appropriate due to severity of illness   Dispo: The patient is from: Home  Anticipated d/c is to: SNF when bed is available               Anticipated d/c date is: 1 day              Patient currently is not medically stable to d/c.  Consultants:   ENT  Medical Oncology  Radiation Oncology  PCCM/Pulmonary  Palliative Care Medicine    Procedures:  5/4 CTA chest W contrast;-negative PE. - Abnormal soft tissue in the hypopharynx, suspicious for underlying mass. CT neck or direct visual inspection recommended for further evaluation. - Dense bilateral lower lobe consolidation, LEFT >> RIGHT . Aspiration is suspected given findings in the hypopharynx. -Evidence of an enhancing mass within the densely consolidated left lower lobe, metastatic disease  or primary neoplasm not excluded.  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 Redmond Baseman ENT upsized #6 cuffless trach>>>   Antimicrobials:  Anti-infectives (From admission, onward)   Start     Dose/Rate Route Frequency Ordered Stop   07/07/19 2200  ampicillin-sulbactam (UNASYN) 1.5 g in sodium chloride 0.9 % 100 mL IVPB     1.5 g 200 mL/hr over 30 Minutes Intravenous Every 6 hours 07/07/19 2051 07/14/19 1606   07/07/19 1930  metroNIDAZOLE (FLAGYL) IVPB 500 mg  Status:  Discontinued     500 mg 100 mL/hr over 60 Minutes Intravenous  Once 07/07/19 1922 07/07/19 2003   07/07/19 1800  cefTRIAXone (ROCEPHIN) 1 g in sodium chloride 0.9 % 100 mL IVPB     1 g 200 mL/hr over 30 Minutes Intravenous  Once 07/07/19 1750 07/07/19 1830   07/07/19 1800  azithromycin (ZITHROMAX) 500 mg in sodium chloride 0.9 % 250 mL IVPB     500 mg 250 mL/hr over 60 Minutes Intravenous  Once 07/07/19 1750 07/07/19 1855     Subjective: Seen and examined and he is not complaining of anything this a.m. and I reinforced that he should not be eating anything or drink anything he understands.  He feels okay.  Surprised that we are attempting to place him in a skilled nursing facility for further rehabilitation.  He denies any lightheadedness or dizziness.  No other concerns otherwise at this time  Objective: Vitals:   07/25/19 1100 07/25/19 1122 07/25/19 1200 07/25/19 1300  BP: 106/79  98/70 101/71  Pulse:   88 91  Resp: 20  (!) 32 (!) 23  Temp:   97.9 F (36.6 C)   TempSrc:    Oral   SpO2:  100% 100% 100%  Weight:      Height:        Intake/Output Summary (Last 24 hours) at 07/25/2019 1512 Last data filed at 07/25/2019 0400 Gross per 24 hour  Intake 817 ml  Output 1750 ml  Net -933 ml   Filed Weights   07/07/19 2049 07/09/19 0949 07/20/19 1600  Weight: 42 kg 42 kg 44 kg   Examination: Physical Exam:  Constitutional: Patient is a frail cachectic and thin African-American male, in no acute distress and appears calm watching television Eyes: Lids and conjunctivae normal, sclerae anicteric  ENMT: External Ears, Nose appear normal. Grossly normal hearing. Neck: Appears normal,  supple, no cervical masses, normal ROM, no appreciable thyromegaly; but he does have a tracheostomy in place connected to ATC and his Passy-Muir valve is out Respiratory: Diminished to auscultation bilaterally with coarse breath sounds, no wheezing, rales, rhonchi or crackles. Normal respiratory effort and patient is not tachypenic. No accessory muscle use.  Unlabored breathing Cardiovascular: RRR, no murmurs / rubs / gallops. S1 and S2 auscultated.  Abdomen: Soft, non-tender, non-distended.  Bowel sounds positive.  PEG tube is in place GU: Deferred. Musculoskeletal: No clubbing / cyanosis of digits/nails. No joint deformity upper and lower extremities.  Skin: No rashes, lesions, ulcers on limited skin evaluation. No induration; Warm and dry.  Neurologic: CN 2-12 grossly intact with no focal deficits. Romberg sign cerebellar reflexes not assessed.  Psychiatric: Normal judgment and insight. Alert and oriented x 3. Normal mood and appropriate affect.   Data Reviewed: I have personally reviewed following labs and imaging studies  CBC: Recent Labs  Lab 07/21/19 1130 07/22/19 0800 07/23/19 0337 07/24/19 1136 07/25/19 0400  WBC 4.8 4.6 4.6 4.1 4.8  NEUTROABS 3.2 2.8 2.6 2.1 2.8  HGB 8.9* 8.9* 8.4* 8.7* 8.8*  HCT 26.9* 26.4* 25.8* 26.5* 26.3*  MCV 85.1 84.6 84.9 86.0 85.9  PLT 408*  444* 361 359 056   Basic Metabolic Panel: Recent Labs  Lab 07/21/19 0614 07/21/19 1130 07/22/19 0331 07/22/19 0800 07/23/19 0337 07/24/19 1136 07/25/19 0400  NA  --  132*  --  133* 131* 135 134*  K  --  3.9  --  4.3 4.2 4.6 4.5  CL  --  95*  --  97* 97* 97* 96*  CO2  --  29  --  28 27 32 30  GLUCOSE  --  102*  --  106* 126* 82 79  BUN  --  9  --  _0 CREATININE  --  0.35*  --  0.43* 0.37* 0.41* 0.44*  CALCIUM  --  8.0*  --  8.4* 7.9* 8.4* 8.7*  MG   < >  --  1.4* 1.2* 2.1 1.6* 2.2  PHOS   < >  --  3.2 3.2 2.7 3.3 3.1   < > = values in this interval not displayed.   GFR: Estimated Creatinine Clearance: 58.1 mL/min (A) (by C-G formula based on SCr of 0.44 mg/dL (L)). Liver Function Tests: Recent Labs  Lab 07/21/19 1130 07/22/19 0800 07/23/19 0337 07/24/19 1136 07/25/19 0400  AST 42* 39 34 36 40  ALT _1 38  ALKPHOS 73 66 68 72 75  BILITOT 0.4 0.4 0.3 0.3 0.3  PROT 5.8* 4.9* 5.6* 6.4* 6.6  ALBUMIN 2.3* 1.9* 2.3* 2.7* 2.8*   No results for input(s): LIPASE, AMYLASE in the last 168 hours. No results for input(s): AMMONIA in the last 168 hours. Coagulation Profile: No results for input(s): INR, PROTIME in the last 168 hours. Cardiac Enzymes: No results for input(s): CKTOTAL, CKMB, CKMBINDEX, TROPONINI in the last 168 hours. BNP (last 3 results) No results for input(s): PROBNP in the last 8760 hours. HbA1C: No results for input(s): HGBA1C in the last 72 hours. CBG: Recent Labs  Lab 07/24/19 2353 07/25/19 0401 07/25/19 0424 07/25/19 0849 07/25/19 1151  GLUCAP 123* 67* 159* 71 153*   Lipid Profile: No results for input(s): CHOL, HDL, LDLCALC, TRIG, CHOLHDL, LDLDIRECT in the last 72 hours. Thyroid Function Tests: No results for input(s): TSH, T4TOTAL, FREET4, T3FREE, THYROIDAB in the last 72 hours. Anemia Panel: No results  for input(s): VITAMINB12, FOLATE, FERRITIN, TIBC, IRON, RETICCTPCT in the last 72 hours. Sepsis Labs: No results for  input(s): PROCALCITON, LATICACIDVEN in the last 168 hours.  Recent Results (from the past 240 hour(s))  SARS CORONAVIRUS 2 (TAT 6-24 HRS) Nasopharyngeal Nasopharyngeal Swab     Status: None   Collection Time: 07/20/19  4:35 PM   Specimen: Nasopharyngeal Swab  Result Value Ref Range Status   SARS Coronavirus 2 NEGATIVE NEGATIVE Final    Comment: (NOTE) SARS-CoV-2 target nucleic acids are NOT DETECTED. The SARS-CoV-2 RNA is generally detectable in upper and lower respiratory specimens during the acute phase of infection. Negative results do not preclude SARS-CoV-2 infection, do not rule out co-infections with other pathogens, and should not be used as the sole basis for treatment or other patient management decisions. Negative results must be combined with clinical observations, patient history, and epidemiological information. The expected result is Negative. Fact Sheet for Patients: SugarRoll.be Fact Sheet for Healthcare Providers: https://www.woods-mathews.com/ This test is not yet approved or cleared by the [place] FDA and  has been authorized for detection and/or diagnosis of SARS-CoV-2 by FDA under an Emergency Use Authorization (EUA). This EUA will remain  in effect (meaning this test can be used) for the duration of the COVID-19 declaration under Section 56 4(b)(1) of the Act, 21 U.S.C. section 360bbb-3(b)(1), unless the authorization is terminated or revoked sooner. Performed at [facility], [place] [address] [place], [place] [address]     RN Pressure Injury Documentation: Pressure Injury 07/07/19 Sacrum Mid;Upper Stage 2 -  Partial thickness loss of dermis presenting as a shallow open injury with a red, pink wound bed without slough. 3 each small separate open areas (Active)  07/07/19 2107  Location: Sacrum  Location Orientation: Mid;Upper  Staging: Stage 2 -  Partial thickness loss of dermis presenting as a shallow open  injury with a red, pink wound bed without slough.  Wound Description (Comments): 3 each small separate open areas  Present on Admission: Yes     Estimated body mass index is 13.53 kg/m as calculated from the following:   Height as of this encounter: _0  (1.803 m).   Weight as of this encounter: 44 kg.  Malnutrition Type:  Nutrition Problem: Severe Malnutrition Etiology: chronic illness(COPD)   Malnutrition Characteristics:  Signs/Symptoms: severe fat depletion, severe muscle depletion   Nutrition Interventions:  Interventions: Tube feeding, Prostat   Radiology Studies: DG CHEST PORT 1 VIEW  Result Date: 07/25/2019 CLINICAL DATA:  Shortness of breath EXAM: PORTABLE CHEST 1 VIEW COMPARISON:  Two days ago FINDINGS: Hazy opacity of the bilateral lower chest which includes pleural fluid. There was lower lobe pneumonia by recent chest CT. Tracheostomy tube and percutaneous gastrostomy tube in place. Normal heart size. IMPRESSION: Stable hazy bilateral lower chest opacification correlating with pneumonia on recent chest CT. There is also layering pleural fluid. Electronically Signed   By: Monte Fantasia M.D.   On: 07/25/2019 07:18   Scheduled Meds: . albuterol  2.5 mg Nebulization Daily  . chlorhexidine  15 mL Mouth Rinse BID  . Chlorhexidine Gluconate Cloth  6 each Topical Daily  . feeding supplement (OSMOLITE 1.5 CAL)  237 mL Per Tube 5 X Daily  . feeding supplement (PRO-STAT SUGAR FREE 64)  30 mL Per Tube Daily  . free water  120 mL Per Tube 5 X Daily  . glycopyrrolate  1 mg Per Tube BID  . heparin injection (subcutaneous)  5,000 Units Subcutaneous Q8H  .  mouth rinse  15 mL Mouth Rinse q12n4p  . polyethylene glycol  17 g Oral BID  . senna-docusate  1 tablet Oral BID  . thiamine  100 mg Per Tube Daily   Continuous Infusions:   LOS: 18 days   Kerney Elbe, DO [facility] PAGER is on AMION  If 7PM-7AM, please contact night-coverage [email or web address]

## 2019-07-26 ENCOUNTER — Inpatient Hospital Stay (HOSPITAL_COMMUNITY): Payer: Medicaid Other

## 2019-07-26 DIAGNOSIS — J9501 Hemorrhage from tracheostomy stoma: Secondary | ICD-10-CM

## 2019-07-26 LAB — CBC WITH DIFFERENTIAL/PLATELET
Abs Immature Granulocytes: 0.01 10*3/uL (ref 0.00–0.07)
Basophils Absolute: 0 10*3/uL (ref 0.0–0.1)
Basophils Relative: 1 %
Eosinophils Absolute: 0 10*3/uL (ref 0.0–0.5)
Eosinophils Relative: 1 %
HCT: 26.8 % — ABNORMAL LOW (ref 39.0–52.0)
Hemoglobin: 8.8 g/dL — ABNORMAL LOW (ref 13.0–17.0)
Immature Granulocytes: 0 %
Lymphocytes Relative: 33 %
Lymphs Abs: 1.4 10*3/uL (ref 0.7–4.0)
MCH: 28.3 pg (ref 26.0–34.0)
MCHC: 32.8 g/dL (ref 30.0–36.0)
MCV: 86.2 fL (ref 80.0–100.0)
Monocytes Absolute: 0.4 10*3/uL (ref 0.1–1.0)
Monocytes Relative: 10 %
Neutro Abs: 2.3 10*3/uL (ref 1.7–7.7)
Neutrophils Relative %: 55 %
Platelets: 323 10*3/uL (ref 150–400)
RBC: 3.11 MIL/uL — ABNORMAL LOW (ref 4.22–5.81)
RDW: 13.7 % (ref 11.5–15.5)
WBC: 4.2 10*3/uL (ref 4.0–10.5)
nRBC: 0 % (ref 0.0–0.2)

## 2019-07-26 LAB — COMPREHENSIVE METABOLIC PANEL
ALT: 49 U/L — ABNORMAL HIGH (ref 0–44)
AST: 46 U/L — ABNORMAL HIGH (ref 15–41)
Albumin: 2.8 g/dL — ABNORMAL LOW (ref 3.5–5.0)
Alkaline Phosphatase: 73 U/L (ref 38–126)
Anion gap: 9 (ref 5–15)
BUN: 15 mg/dL (ref 8–23)
CO2: 31 mmol/L (ref 22–32)
Calcium: 8.9 mg/dL (ref 8.9–10.3)
Chloride: 95 mmol/L — ABNORMAL LOW (ref 98–111)
Creatinine, Ser: 0.4 mg/dL — ABNORMAL LOW (ref 0.61–1.24)
GFR calc Af Amer: >60 mL/min (ref >60)
GFR calc non Af Amer: >60 mL/min (ref >60)
Glucose, Bld: 81 mg/dL (ref 70–99)
Potassium: 4.7 mmol/L (ref 3.5–5.1)
Sodium: 135 mmol/L (ref 135–145)
Total Bilirubin: 0.4 mg/dL (ref 0.3–1.2)
Total Protein: 6.6 g/dL (ref 6.5–8.1)

## 2019-07-26 LAB — GLUCOSE, CAPILLARY
Glucose-Capillary: 77 mg/dL (ref 70–99)
Glucose-Capillary: 71 mg/dL (ref 70–99)
Glucose-Capillary: 91 mg/dL (ref 70–99)
Glucose-Capillary: 102 mg/dL — ABNORMAL HIGH (ref 70–99)
Glucose-Capillary: 107 mg/dL — ABNORMAL HIGH (ref 70–99)

## 2019-07-26 LAB — PHOSPHORUS: Phosphorus: 2.9 mg/dL (ref 2.5–4.6)

## 2019-07-26 LAB — MAGNESIUM: Magnesium: 1.7 mg/dL (ref 1.7–2.4)

## 2019-07-26 MED ORDER — LIDOCAINE HCL 4 % EX SOLN
0.0000 mL | Freq: Once | CUTANEOUS | Status: DC | PRN
  Filled 2019-07-26: qty 50

## 2019-07-26 MED ORDER — LIDOCAINE-EPINEPHRINE (PF) 1 %-1:200000 IJ SOLN
0.0000 mL | Freq: Once | INTRAMUSCULAR | Status: DC | PRN
  Filled 2019-07-26: qty 30

## 2019-07-26 MED ORDER — TRIPLE ANTIBIOTIC 3.5-400-5000 EX OINT
1.0000 "application " | TOPICAL_OINTMENT | Freq: Once | CUTANEOUS | Status: DC | PRN
  Filled 2019-07-26: qty 1

## 2019-07-26 MED ORDER — MAGNESIUM SULFATE 2 GM/50ML IV SOLN
2.0000 g | Freq: Once | INTRAVENOUS | Status: AC
  Administered 2019-07-26: 2 g via INTRAVENOUS

## 2019-07-26 MED ORDER — OXYMETAZOLINE HCL 0.05 % NA SOLN
1.0000 | Freq: Once | NASAL | Status: DC | PRN
  Filled 2019-07-26: qty 15

## 2019-07-26 MED ORDER — SILVER NITRATE-POT NITRATE 75-25 % EX MISC
1.0000 | Freq: Once | CUTANEOUS | Status: DC | PRN
  Filled 2019-07-26: qty 1

## 2019-07-26 MED ORDER — LIDOCAINE HCL 2 % EX GEL
1.0000 "application " | Freq: Once | CUTANEOUS | Status: DC | PRN
  Filled 2019-07-26: qty 4250

## 2019-07-26 NOTE — Progress Notes (Signed)
[name], male   DOB: [birth year], 64 y.o.   MRN: [id number]  Consulted for bleeding of the tracheostomy that happened during the night.  Currently not bleeding.  He had tracheostomy placed in the past month or 2 for advanced stage obstructing hypopharyngeal cancer.  He started radiation a couple of days ago.  On exam, he is a cachectic gentleman.  He is lying comfortably and breathing easily through tracheostomy tube.  There is a trach collar with humidification on.  There is some dried blood around the tracheostomy.  I removed the inner cannula and the Passy-Muir valve.  I passed the fiberoptic scope through the tracheostomy.  Down to the carina the trachea looks clear but the mucosa looks irritated and dry.  I pulled out the tracheostomy tube all the way to the stoma and inspected the tracheal wall and there is no evidence of erosion.  There is no bleeding during the evaluation.  The stoma site is also irritated and dry.  Bleeding is likely from the severe dryness of the mucosa.  Recommend increase the amount of humidification through the trach collar.  Recommend keep the Passy-Muir valve off so that he is getting maximum humidification through the tracheostomy.  Contact us if there is any additional bleeding problems.

## 2019-07-26 NOTE — Progress Notes (Signed)
PROGRESS NOTE    Devon Peterson  [id number] DOB: [birth year] DOA: 07/07/2019 PCP: Patient, No Pcp Per  Brief Narrative:  HPI per Dr. Shela Leff on 07/07/19 Devon Peterson a 64 y.o.malewith medical history significant ofCOPD,polysubstance abuse (alcohol, tobacco, cocaine), hospital admission 04/17/2019-04/19/2019 for sepsis secondary to pneumonia and COPD exacerbationpresenting to the ED via EMS for evaluation of cough, shortness of breath, fatigue, and unintentional weight loss.History provided by patient and his nephew at bedside. Nephew states patient was admitted to the hospital a month ago for pneumonia and since then has continued to do poorly. He has difficulty swallowing food and has been spitting up saliva. He is coughing a lot. Patient denies fevers, chills, shortness of breath, chest pain, nausea, vomiting, abdominal pain, diarrhea, or dysuria. Nephew states patient has been smoking a pack of cigarettes daily since a very young age. He has lost a lot of weight recently. Addendum: 07/07/2019 8:40 PM. Patient also denied history of hematemesis, hematochezia, or melena.  ED Course:Tachycardic on arrival with heart rate in the 150s, appeared to be sinus rhythm. Afebrile. Slightly tachypneic. Labs showing no leukocytosis. Initial lactic acid 2.5, repeat after IV fluid pending. Hemoglobin 12.7, stable compared to labs done in February 2021. Platelet count normal. BUN 27, creatinine 0.8. Albumin 3.3. LFTs normal. Blood culture x2 pending. SARS-CoV-2 PCR test negative. Influenza panel negative. UA not suggestive of infection. Urine culture pending. Chest x-ray showing new left lower lobe airspace opacity concerning for pneumonia. CT angiogram chest negative for PE. Showing abnormal soft tissue in the hypopharynx suspicious for underlying mass. Dense bilateral lower lobe consolidation, left greater than right. Aspiration suspected given findings in the hypopharynx.  Also showing evidence of an enhancing mass within the densely consolidated left lower lobe, metastatic disease or primary neoplasm not excluded. Marked cachexia.  Patient received ceftriaxone, azithromycin, and 2 L normal saline boluses.  **Interim History He was evaluated by ENT who recommended a tracheostomy.  Tracheostomy was done on 07/10/19 and postoperatively he ended up on pressors which were weaned off.  He is also getting a tissue biopsy and medical oncology as well as radiation oncology were consulted for further evaluation.  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 Postoperatively he had hypotension and went in A. fib with RVR and started on Neo-Synephrine drip has been stopped.  Critical care was consulted for further evaluation and his hypotension is improved.    Underwent PEG tube placement on 07/14/19 and nutrition is recommending that once the PEG is placed starting the patient on Osmolite 1.520 mL/h to advance by 10 mL every 12 hours to reach a goal rate of 50 mL/h with Protostat 30 mL once daily. Underwent PMV evaluation and is doing well. Has not had a BM since 07/14/19 so will start bowel regimen. PT/OT recommending SNF still and likely will be discharged when bed is available and is accepted.  Per my discussion with the transition of care manager placement may be an issue due to his tracheostomy status and given his Medicaid pending insurance.. Referral has been made to the Oncology Nurse Navigator. His Trach Tube was changed to a #6 cuffless [place] during this hospitalization as well. Palliative Care was consulted and he wishes to remain a FULL CODE. Radiation Oncology evaluated and he is a candidate for Radiotherapy so has started treatments.  07/24/19: Nursing and speech therapy felt the patient was sneaking cookies even though he is n.p.o.  He did ask for soda and had some overt signs or symptoms of  aspiration after that when witnessed by the speech therapist.   Skilled placement is difficult.  Palliative Care to readdress goals of care if patient wants to eat   07/25/2019: Is doing okay today and denies any complaints.  I discussed with him about sneaking cookies and he understands agrees and states that he will not do it again.  Patient in SNF is still an issue but he is stable to be transferred from the stepdown unit to the medical floor currently.  07/26/2019: Overnight he started having some significant amount of blood being expectorated from his tracheostomy tube.  He had to be suctioned and cleaned and had a very large inner cannula clot.  Today he is a little bit more sleepy.  Still has been sneaking cookies despite me advising him not to.  Palliative care to readdress goals of care today and we have reconsulted ENT to evaluate his blood coming from his tracheostomy.  Hemoglobin remains relatively stable despite having that much blood and has tracheostomy tube.  Currently saturating fine on the ATC.  ENT evaluated and Dr. Constance Holster passed a fiberoptic scope to his tracheostomy down the carina and the trachea looks clear but he stated the mucosa looked irritated and dry and he recommended increasing the amount of the humidification to the trach collar  Assessment & Plan:   Principal Problem:   Aspiration pneumonia ([place]) Active Problems:   Sepsis ([place])   Dysphagia   Severe protein-calorie malnutrition ([place])   Alcohol use   Pressure injury of skin   Lung mass   Neck mass   Tracheostomy care ([place])   Squamous cell carcinoma of neck   Palliative care encounter  Sepsis 2/2 to Aspiration Pneumonia -Completed course of antibiotics -C/w Albuterol neb TID -Robinul IV 0.1 mg BID -DuoNeb PRN -CXR yesterday (07/25/19) showed "Stable hazy bilateral lower chest opacification correlating with pneumonia on recent chest CT. There is also layering pleural fluid." -He had witnessed aspiration events after smoke a cookie and drank some soda; reinforced that he is  n.p.o. and will not be given any food and will need to watch for memory to bring him food -Reinforce strict n.p.o. 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 Wilburn Cornelia ENT placed tracheostomy -5/13 oncology sent PD-L1 testing on pathology. -5/13 Recommend a course ofpalliative ordefinitive radiation. Radiation oncology consult has been completed. Awaiting final recommendations. -5/13 oncology Will follow up on the lung mass and determine further work-up at a future date. -Outpatient follow-up at the cancer center -Continued thick secretions requiring frequent suctioning. -5/14Dr. Maryhill ENT upsized trach to #6 cuffless to help with secretions. -5/14 continue hypertonic saline nebulizer daily followed by albuterol nebulizer treatment -5/18 per Dr. Micheline Rough palliative care note plan for patient's care is as following;             -Full code/full scope             -Plan for 4-week course of radiation followed by reevaluation for potential for systemic  therapy LEFT lung mass -Suspicious for neoplasm either second primary or metastatic.   -Currently undergoing Radiation Treatments and underwent radiation treatment yesterday -C/w PMV and continue with speech therapy -Placement is still difficult and he still will follow up with oncology Dr. Benay Spice in the outpatient setting -Reconsulted ENT Dr. Constance Holster due to blood coming from his Tracheostomy and Clots in  his inner cannula   Hemorrhage in Trachestomy  -Re-consulted ENT -Had it start Last night -Respiratory therapist suctioned. -ENT Dr. Constance Holster evaluated and he passed a fiberoptic scope to his tracheostomy down to the carina but he is felt that the trachea looks clear.  The mucosal look irritated and dry he recommended increasing the amount of humidification to the trach collar and  recommended reconsulting if there is any additional bleeding problems  COPD -See Above  Dysphagia -5/11 PEG tube placed -Osmolite 1.5 @ 20 ml/hr to advance by 10 ml every 12 hours to reach goal rate of 50 ml/hr and now it is to be changed to bolus feedings and he will get one carton of Osmolite 1.55 times a day with 30 mL of Po stat once a day and 120 mL free water per tube feeding bolus; continue to monitor carefully now that he is on bolus feedings and has some hypoglycemia episode -30 ml prostat once/day. -AGAIN Advised against any oral intake currently   A. fib with RVR -Went into A. fib with RVR in the PACU and was given IV metoprolol -Heart rates have improved some but he is hypotensive and had to be placed on a Neo-Synephrine drip -We will not anticoagulate at this time; he has now on heparin subcu -5/16 currently NSR  Hypotension -Resolved  Severe protein calorie malnutrition/Underweight -Estimated body mass index is 13.53 kg/m as calculated from the following:   Height as of this encounter: '5\' 11"'$  (1.803 m).   Weight as of this encounter: 44 kg. -See Above as he is now getting bolus tube feeds  Generalized weakness/physical deconditioning: -PT and OTinitialevaluation recommending Home Health PTbut re-evaluation recommending SNF now given his condition; SNF Placement pending and he is a difficult placement  EtOH Abuse -Folic acid 1 mg daily -Thiamine 100 mg daily -Currently no signs or symptoms of withdrawal.  Cocaine abuse -5/5 Positive cocaine -Avoid beta-blockers, although if absolutely required for control of HTN this far out would be safe  Tobacco abuse -NicoDerm patch -Counseled on need for absolute cessation  Hypoglycemia -In the setting of poor p.o. intake and was persistent; Became Hypoglycemic again -CBG's ranging from 71-91 -Discontinued D10  Hypokalemia -Potassium goal> 4 -K+ was 4.7 this AM -Continue to Monitor and Replete as  Necessary   Hypomagnesmia -Magnesium goal> 2 -Mag Level is now 1.7 -Replete with IV mag sulfate 2 g -Continue to Monitor and Replete as Ncessary  Hypophosphatemia -Improved -Phos Level is now 2.9 -Continue to Monitor and Replete as Necessary   Hyponatremia -Normal saline 69m/hr will now be D/C'd  -Na+has been fluctuating and was 132 -> 133 -> 131 -> 135 -> 134 -> 135 -5/16 NaCl 2 g TID (hold)  Normocytic Anemia -Patient's hemoglobin/hematocit is stable and is 8.8/26.8 -Check anemia panel and showed an iron level of 30, U IBC 130, TIBC 150, saturation ratios of 19%, ferritin level 421, folate level 11.5, and vitamin B12 of 281 -Continue to monitor for signs and symptoms of bleeding; currently had a lot of bleeding from his trach site to be injected with lidocaine  Stage II sacral pressure ulcer (POA Pressure Injury 07/07/19 Sacrum Mid;Upper Stage 2 -  Partial thickness loss of dermis presenting as a shallow open injury with a red, pink wound bed without slough. 3 each small separate open areas (Active)  07/07/19 2107  Location: Sacrum  Location Orientation: Mid;Upper  Staging: Stage 2 -  Partial thickness loss of dermis presenting as a shallow open injury with  a red, pink wound bed without slough.  Wound Description (Comments): 3 each small separate open areas  Present on Admission: Yes  -Continue with Mepilex pads and frequent turning   Goals of Care 5/13 palliative care consult; patient with poorly differentiated squamous carcinoma, extremely cachectic now trach and PEG dependent.  Evaluate for change of CODE STATUS, consider hospice -- PT OT recommending SNF.  Will await palliative care recommendations -5/17 PT/OT update consult; in order to place patient per NCM need an updated consult recommending SNF; Now done and still Pending SNF Placement   Constipation -Start Bowel Regimen and this is improved as he is having bowel movements now -Tinea with bowel regimen and  continue to monitor   DVT prophylaxis: SCDs; Will add Heparin sq  Code Status: FULL CODE: Palliative Care to Re-address GOC this weekend Family Communication: No family present at bedside  Disposition Plan: Anticipate D/C to SNF when bed is available; Currently no Bed Available and is difficult to be placed: He is stable to be transferred out of the stepdown unit to the medical floor  Status is: Inpatient  Remains inpatient appropriate because:Inpatient level of care appropriate due to severity of illness   Dispo: The patient is from: Home              Anticipated d/c is to: SNF when bed is available               Anticipated d/c date is: 1 day              Patient currently is not medically stable to d/c.  Consultants:   ENT  Medical Oncology  Radiation Oncology  PCCM/Pulmonary  Palliative Care Medicine    Procedures:  5/4 CTA chest W contrast;-negative PE. - Abnormal soft tissue in the hypopharynx, suspicious for underlying mass. CT neck or direct visual inspection recommended for further evaluation. - Dense bilateral lower lobe consolidation, LEFT >> RIGHT . Aspiration is suspected given findings in the hypopharynx. -Evidence of an enhancing mass within the densely consolidated left lower lobe, metastatic disease or primary neoplasm not excluded.  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 Redmond Baseman ENT upsized #6 cuffless trach>>>   Antimicrobials:  Anti-infectives (From admission, onward)   Start     Dose/Rate Route Frequency  Ordered Stop   07/07/19 2200  ampicillin-sulbactam (UNASYN) 1.5 g in sodium chloride 0.9 % 100 mL IVPB     1.5 g 200 mL/hr over 30 Minutes Intravenous Every 6 hours 07/07/19 2051 07/14/19 1606   07/07/19 1930  metroNIDAZOLE (FLAGYL) IVPB 500 mg  Status:  Discontinued     500 mg 100 mL/hr over 60 Minutes Intravenous  Once 07/07/19 1922 07/07/19 2003   07/07/19 1800  cefTRIAXone (ROCEPHIN) 1 g in sodium chloride 0.9 % 100 mL IVPB     1 g 200 mL/hr over 30 Minutes Intravenous  Once 07/07/19 1750 07/07/19 1830   07/07/19 1800  azithromycin (ZITHROMAX) 500 mg in sodium chloride 0.9 % 250 mL IVPB     500 mg 250 mL/hr over 60 Minutes Intravenous  Once 07/07/19 1750 07/07/19 1855     Subjective: Seen and examined  and was little somnolent and drowsy this morning.  He transferred from the stepdown unit to the medical floor and overnight he had some bleeding from his tracheostomy.  Bleeding has since ceased but he does have some dried blood around his tracheostomy tube.  Nursing also found some cookies in his bed yesterday.  No chest pain, lightheadedness or dizziness.  Patient denies any shortness of breath but he does have some rhonchi.  No other concerns or complaints at this time.  Objective: Vitals:   07/26/19 0010 07/26/19 0407 07/26/19 1104 07/26/19 1229  BP: 96/71 105/73  100/77  Pulse: 88 85 93 90  Resp: '14 14 14 18  '$ Temp: 97.9 F (36.6 C) 98.3 F (36.8 C)  98.6 F (37 C)  TempSrc: Oral Oral    SpO2: 100% 100% 100% 98%  Weight:      Height:        Intake/Output Summary (Last 24 hours) at 07/26/2019 1242 Last data filed at 07/26/2019 1126 Gross per 24 hour  Intake 1574 ml  Output 3150 ml  Net -1576 ml   Filed Weights   07/07/19 2049 07/09/19 0949 07/20/19 1600  Weight: 42 kg 42 kg 44 kg   Examination: Physical Exam:  Constitutional: The patient is a frail cachectic thin African-American male currently in no acute distress but he does appear little somnolent and drowsy is  resting. Eyes: Lids and conjunctivae normal, sclerae anicteric  ENMT: External Ears, Nose appear normal. Grossly normal hearing.   Neck: Has a tracheostomy in place with his Passy-Muir valve out and is connected to ATC and does have some dried blood around his tracheostomy Respiratory: Diminished to auscultation bilaterally with coarse breath sounds and some rhonchi.  No appreciable wheezing, rales or crackles noted.  Patient has a normal respiratory effort and is not tachypneic and he has unlabored breathing currently Cardiovascular: RRR, no murmurs / rubs / gallops. S1 and S2 auscultated. No carotid bruits.  Abdomen: Soft, non-tender, non-distended. Bowel sounds positive.  GU: Deferred. Musculoskeletal: No clubbing / cyanosis of digits/nails. No joint deformity upper and lower extremities.  Skin: No rashes, lesions, ulcers on limited skin evaluation. No induration; Warm and dry.  Neurologic: CN 2-12 grossly intact with no focal deficits. Romberg sign and cerebellar reflexes not assessed.  Psychiatric: Normal judgment and insight.  He is a little bit drowsy and somnolent but then when I wake him up he becomes alert. Normal mood and appropriate affect.   Data Reviewed: I have personally reviewed following labs and imaging studies  CBC: Recent Labs  Lab 07/23/19 0337 07/24/19 1136 07/25/19 0400 07/25/19 2126 07/26/19 0415  WBC 4.6 4.1 4.8 4.4 4.2  NEUTROABS 2.6 2.1 2.8 2.4 2.3  HGB 8.4* 8.7* 8.8* 8.4* 8.8*  HCT 25.8* 26.5* 26.3* 25.7* 26.8*  MCV 84.9 86.0 85.9 86.2 86.2  PLT 361 359 333 296 295   Basic Metabolic Panel: Recent Labs  Lab 07/22/19 0800 07/23/19 0337 07/24/19 1136 07/25/19 0400 07/26/19 0415  NA 133* 131* 135 134* 135  K 4.3 4.2 4.6 4.5 4.7  CL 97* 97* 97* 96* 95*  CO2 28 27 32 30 31  GLUCOSE 106* 126* 82 79 81  BUN '10 12 12 13 15  '$ CREATININE 0.43* 0.37* 0.41* 0.44* 0.40*  CALCIUM 8.4* 7.9* 8.4* 8.7* 8.9  MG 1.2* 2.1 1.6* 2.2 1.7  PHOS 3.2 2.7 3.3 3.1 2.9    GFR: Estimated Creatinine Clearance: 58.1 mL/min (A) (by C-G formula based on SCr of 0.4 mg/dL (  L)). Liver Function Tests: Recent Labs  Lab 07/22/19 0800 07/23/19 0337 07/24/19 1136 07/25/19 0400 07/26/19 0415  AST 39 34 36 40 46*  ALT '27 28 30 '$ 38 49*  ALKPHOS 66 68 72 75 73  BILITOT 0.4 0.3 0.3 0.3 0.4  PROT 4.9* 5.6* 6.4* 6.6 6.6  ALBUMIN 1.9* 2.3* 2.7* 2.8* 2.8*   No results for input(s): LIPASE, AMYLASE in the last 168 hours. No results for input(s): AMMONIA in the last 168 hours. Coagulation Profile: No results for input(s): INR, PROTIME in the last 168 hours. Cardiac Enzymes: No results for input(s): CKTOTAL, CKMB, CKMBINDEX, TROPONINI in the last 168 hours. BNP (last 3 results) No results for input(s): PROBNP in the last 8760 hours. HbA1C: No results for input(s): HGBA1C in the last 72 hours. CBG: Recent Labs  Lab 07/25/19 1544 07/25/19 2002 07/26/19 0405 07/26/19 0718 07/26/19 1122  GLUCAP 141* 129* 77 71 91   Lipid Profile: No results for input(s): CHOL, HDL, LDLCALC, TRIG, CHOLHDL, LDLDIRECT in the last 72 hours. Thyroid Function Tests: No results for input(s): TSH, T4TOTAL, FREET4, T3FREE, THYROIDAB in the last 72 hours. Anemia Panel: No results for input(s): VITAMINB12, FOLATE, FERRITIN, TIBC, IRON, RETICCTPCT in the last 72 hours. Sepsis Labs: No results for input(s): PROCALCITON, LATICACIDVEN in the last 168 hours.  Recent Results (from the past 240 hour(s))  SARS CORONAVIRUS 2 (TAT 6-24 HRS) Nasopharyngeal Nasopharyngeal Swab     Status: None   Collection Time: 07/20/19  4:35 PM   Specimen: Nasopharyngeal Swab  Result Value Ref Range Status   SARS Coronavirus 2 NEGATIVE NEGATIVE Final    Comment: (NOTE) SARS-CoV-2 target nucleic acids are NOT DETECTED. The SARS-CoV-2 RNA is generally detectable in upper and lower respiratory specimens during the acute phase of infection. Negative results do not preclude SARS-CoV-2 infection, do not rule out  co-infections with other pathogens, and should not be used as the sole basis for treatment or other patient management decisions. Negative results must be combined with clinical observations, patient history, and epidemiological information. The expected result is Negative. Fact Sheet for Patients: SugarRoll.be Fact Sheet for Healthcare Providers: https://www.woods-mathews.com/ This test is not yet approved or cleared by the [place] FDA and  has been authorized for detection and/or diagnosis of SARS-CoV-2 by FDA under an Emergency Use Authorization (EUA). This EUA will remain  in effect (meaning this test can be used) for the duration of the COVID-19 declaration under Section 56 4(b)(1) of the Act, 21 U.S.C. section 360bbb-3(b)(1), unless the authorization is terminated or revoked sooner. Performed at [facility], [place] [address] [place], [place] [address]     RN Pressure Injury Documentation: Pressure Injury 07/07/19 Sacrum Mid;Upper Stage 2 -  Partial thickness loss of dermis presenting as a shallow open injury with a red, pink wound bed without slough. 3 each small separate open areas (Active)  07/07/19 2107  Location: Sacrum  Location Orientation: Mid;Upper  Staging: Stage 2 -  Partial thickness loss of dermis presenting as a shallow open injury with a red, pink wound bed without slough.  Wound Description (Comments): 3 each small separate open areas  Present on Admission: Yes     Estimated body mass index is 13.53 kg/m as calculated from the following:   Height as of this encounter: '5\' 11"'$  (1.803 m).   Weight as of this encounter: 44 kg.  Malnutrition Type:  Nutrition Problem: Severe Malnutrition Etiology: chronic illness(COPD)   Malnutrition Characteristics:  Signs/Symptoms: severe fat depletion, severe  muscle depletion   Nutrition Interventions:  Interventions: Tube feeding, Prostat   Radiology Studies: DG  CHEST PORT 1 VIEW  Result Date: 07/25/2019 CLINICAL DATA:  Shortness of breath EXAM: PORTABLE CHEST 1 VIEW COMPARISON:  Two days ago FINDINGS: Hazy opacity of the bilateral lower chest which includes pleural fluid. There was lower lobe pneumonia by recent chest CT. Tracheostomy tube and percutaneous gastrostomy tube in place. Normal heart size. IMPRESSION: Stable hazy bilateral lower chest opacification correlating with pneumonia on recent chest CT. There is also layering pleural fluid. Electronically Signed   By: Monte Fantasia M.D.   On: 07/25/2019 07:18   Scheduled Meds: . albuterol  2.5 mg Nebulization Daily  . chlorhexidine  15 mL Mouth Rinse BID  . Chlorhexidine Gluconate Cloth  6 each Topical Daily  . feeding supplement (OSMOLITE 1.5 CAL)  237 mL Per Tube 5 X Daily  . feeding supplement (PRO-STAT SUGAR FREE 64)  30 mL Per Tube Daily  . free water  120 mL Per Tube 5 X Daily  . glycopyrrolate  1 mg Per Tube BID  . heparin injection (subcutaneous)  5,000 Units Subcutaneous Q8H  . mouth rinse  15 mL Mouth Rinse q12n4p  . polyethylene glycol  17 g Oral BID  . senna-docusate  1 tablet Oral BID  . thiamine  100 mg Per Tube Daily   Continuous Infusions:   LOS: 19 days   Kerney Elbe, DO [facility] PAGER is on AMION  If 7PM-7AM, please contact night-coverage [email or web address]

## 2019-07-26 NOTE — Progress Notes (Signed)
Daily Progress Note   Patient Name: Devon Peterson       Date: 07/26/2019 DOB: 11/14/1955  Age: 64 y.o. MRN#: [id number] Attending Physician: Kerney Elbe, DO Primary Care Physician: Patient, No Pcp Per Admit Date: 07/07/2019  Reason for Consultation/Follow-up: Establishing goals of care  Subjective: I met with [name] today following concerns that he has been sneaking food despite NPO status and indicated that he wanted to eat despite aspiration risk.  He was awake and alert and lying in bed.  He answered questions when asked, but he was not fully invested in conversation as he continued to watch TV.    I talked with him about quality of life and his desire to eat as well as his desire for continued aggressive interventions.  Following discussion, he simply stated, "that is over."  When I asked him to clarify what he meant by this, he indicated that he was saying he was not going to eat or drink anymore until cleared by speech.  He did indicate that he was planning to continue swallowing exercises with hope he may eventually regain enough strength to be able to take PO intake.  Length of Stay: 19  Current Medications: Scheduled Meds:  . albuterol  2.5 mg Nebulization Daily  . chlorhexidine  15 mL Mouth Rinse BID  . Chlorhexidine Gluconate Cloth  6 each Topical Daily  . feeding supplement (OSMOLITE 1.5 CAL)  237 mL Per Tube 5 X Daily  . feeding supplement (PRO-STAT SUGAR FREE 64)  30 mL Per Tube Daily  . free water  120 mL Per Tube 5 X Daily  . glycopyrrolate  1 mg Per Tube BID  . heparin injection (subcutaneous)  5,000 Units Subcutaneous Q8H  . mouth rinse  15 mL Mouth Rinse q12n4p  . polyethylene glycol  17 g Oral BID  . senna-docusate  1 tablet Oral BID    . thiamine  100 mg Per Tube Daily    Continuous Infusions:   PRN Meds: ipratropium-albuterol, labetalol, lidocaine, lidocaine, lidocaine-EPINEPHrine, ondansetron (ZOFRAN) IV, oxymetazoline, silver nitrate applicators, sodium chloride flush, Triple Antibiotic  Physical Exam         GEN: Awake alert resting  in bed, frail and cachectic ENT: Trach in place Resp: Regular, coarse throughout Cardiovascular: Regular, no murmur Abdomen: Soft, PEG in place Extremity: No edema: Clubbing noted, missing digits upper extremity  Vital Signs: BP 117/83 (BP Location: Left Arm)   Pulse 100   Temp 98.8 F (37.1 C) (Axillary)   Resp 16   Ht _0  (1.803 m)   Wt 44 kg   SpO2 99%   BMI 13.53 kg/m  SpO2: SpO2: 99 % O2 Device: O2 Device: Tracheostomy Collar O2 Flow Rate: O2 Flow Rate (L/min): 5 L/min  Intake/output summary:   Intake/Output Summary (Last 24 hours) at 07/26/2019 2313 Last data filed at 07/26/2019 2012 Gross per 24 hour  Intake --  Output 2950 ml  Net -2950 ml   LBM: Last BM Date: 07/25/19 Baseline Weight: Weight: 42 kg Most recent weight: Weight: 44 kg       Palliative Assessment/Data:    Flowsheet Rows     Most Recent Value  Intake Tab  Referral Department  Hospitalist  Unit at Time of Referral  ICU  Palliative Care Primary Diagnosis  Cancer  Date Notified  07/15/19  Palliative Care Type  New Palliative care  Reason for referral  Clarify Goals of Care  Date of Admission  07/07/19  Date first seen by Palliative Care  07/17/19  # of days Palliative referral response time  2 Day(s)  # of days IP prior to Palliative referral  8  Clinical Assessment  Psychosocial & Spiritual Assessment  Palliative Care Outcomes      Patient Active Problem List   Diagnosis Date Noted  . Squamous cell carcinoma of neck   . Palliative care encounter   . Tracheostomy care ([place])   . 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 Lung mass   . Neck mass   . Pressure injury of skin  07/08/2019  . Aspiration pneumonia ([place]) 07/07/2019  . Dysphagia 07/07/2019  . Severe protein-calorie malnutrition ([place]) 07/07/2019  . Alcohol use 07/07/2019  . Sepsis ([place]) 04/17/2019  . COPD exacerbation ([place])   . Lactic acidosis   . Tobacco abuse   . 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 Frailty deconditioning and compromised functional status.  Recommendations/Plan: - Discussed Mr Matranga desire for PO intake and how this relates to quality of life.  He is insistent that he wants to continue with aggressive interventions and will not sneak any further PO intake. - FULL CODE/FULL SCOPE treatment  Goals of Care and Additional Recommendations:  Limitations on Scope of Treatment: Full Scope Treatment  Code Status:    Code Status Orders  (From admission, onward)         Start     Ordered   07/07/19 1957  Full code  Continuous     07/07/19 2003        Code Status History    Date Active Date Inactive Code Status Order ID Comments User Context   04/17/2019 1638 04/19/2019 1957 Full Code [id number]  Loletha Grayer, MD ED   Advance Care Planning Activity       Prognosis:   Unable to determine  Discharge Planning:  To Be Determined Likely for skilled  nursing facility rehabilitation attempt with palliative services following.     Thank you for allowing the Palliative Medicine Team to assist in the care of this patient.   Total Time 20 Prolonged Time Billed  no    Greater than 50%  of this time was spent counseling and coordinating care related to the above assessment and plan.  Micheline Rough, MD  Please contact Palliative  Medicine Team phone at [phone] for questions and concerns.

## 2019-07-26 NOTE — Progress Notes (Signed)
Patient began expectorating blood from his tracheostomy over night, respiratory cleaned the tracheostomy and inner cannula twice. Patient is not experiencing drop to O2 saturation, and Hmb stable at 8.4 which has been within patient's range during this hospitalization. Morning dose of Heparin held per on call providers instruction as bleeding has continued to occur throughout the evening, estimated blood loss noted to be less than approximately 146mL.

## 2019-07-27 ENCOUNTER — Ambulatory Visit
Admit: 2019-07-27 | Discharge: 2019-07-27 | Disposition: A | Discharge status: Home / Self Care | Payer: Medicaid Other | Attending: Radiation Oncology | Admitting: Radiation Oncology

## 2019-07-27 ENCOUNTER — Inpatient Hospital Stay (HOSPITAL_COMMUNITY): Payer: Medicaid Other

## 2019-07-27 LAB — CBC WITH DIFFERENTIAL/PLATELET
Abs Immature Granulocytes: 0.01 10*3/uL (ref 0.00–0.07)
Basophils Absolute: 0 10*3/uL (ref 0.0–0.1)
Basophils Relative: 1 %
Eosinophils Absolute: 0 10*3/uL (ref 0.0–0.5)
Eosinophils Relative: 1 %
HCT: 27.1 % — ABNORMAL LOW (ref 39.0–52.0)
Hemoglobin: 8.7 g/dL — ABNORMAL LOW (ref 13.0–17.0)
Immature Granulocytes: 0 %
Lymphocytes Relative: 23 %
Lymphs Abs: 1.2 10*3/uL (ref 0.7–4.0)
MCH: 27.6 pg (ref 26.0–34.0)
MCHC: 32.1 g/dL (ref 30.0–36.0)
MCV: 86 fL (ref 80.0–100.0)
Monocytes Absolute: 0.5 10*3/uL (ref 0.1–1.0)
Monocytes Relative: 10 %
Neutro Abs: 3.3 10*3/uL (ref 1.7–7.7)
Neutrophils Relative %: 65 %
Platelets: 324 10*3/uL (ref 150–400)
RBC: 3.15 MIL/uL — ABNORMAL LOW (ref 4.22–5.81)
RDW: 14.2 % (ref 11.5–15.5)
WBC: 5 10*3/uL (ref 4.0–10.5)
nRBC: 0 % (ref 0.0–0.2)

## 2019-07-27 LAB — GLUCOSE, CAPILLARY
Glucose-Capillary: 100 mg/dL — ABNORMAL HIGH (ref 70–99)
Glucose-Capillary: 129 mg/dL — ABNORMAL HIGH (ref 70–99)
Glucose-Capillary: 139 mg/dL — ABNORMAL HIGH (ref 70–99)
Glucose-Capillary: 173 mg/dL — ABNORMAL HIGH (ref 70–99)
Glucose-Capillary: 76 mg/dL (ref 70–99)
Glucose-Capillary: 79 mg/dL (ref 70–99)
Glucose-Capillary: 91 mg/dL (ref 70–99)
Glucose-Capillary: 99 mg/dL (ref 70–99)

## 2019-07-27 LAB — COMPREHENSIVE METABOLIC PANEL
ALT: 40 U/L (ref 0–44)
AST: 34 U/L (ref 15–41)
Albumin: 3.1 g/dL — ABNORMAL LOW (ref 3.5–5.0)
Alkaline Phosphatase: 74 U/L (ref 38–126)
Anion gap: 10 (ref 5–15)
BUN: 16 mg/dL (ref 8–23)
CO2: 30 mmol/L (ref 22–32)
Calcium: 8.9 mg/dL (ref 8.9–10.3)
Chloride: 95 mmol/L — ABNORMAL LOW (ref 98–111)
Creatinine, Ser: 0.46 mg/dL — ABNORMAL LOW (ref 0.61–1.24)
GFR calc Af Amer: >60 mL/min (ref >60)
GFR calc non Af Amer: >60 mL/min (ref >60)
Glucose, Bld: 86 mg/dL (ref 70–99)
Potassium: 4.5 mmol/L (ref 3.5–5.1)
Sodium: 135 mmol/L (ref 135–145)
Total Bilirubin: 0.2 mg/dL — ABNORMAL LOW (ref 0.3–1.2)
Total Protein: 6.9 g/dL (ref 6.5–8.1)

## 2019-07-27 LAB — PHOSPHORUS: Phosphorus: 2.9 mg/dL (ref 2.5–4.6)

## 2019-07-27 LAB — BLOOD GAS, ARTERIAL
Acid-Base Excess: 1.6 mmol/L (ref 0.0–2.0)
Bicarbonate: 28.6 mmol/L — ABNORMAL HIGH (ref 20.0–28.0)
Drawn by: 257701
FIO2: 100
MECHVT: 600 mL
O2 Saturation: 44.3 %
PEEP: 8 cmH2O
Patient temperature: 96.7
RATE: 16 resp/min
pCO2 arterial: 60.2 mmHg — ABNORMAL HIGH (ref 32.0–48.0)
pH, Arterial: 7.299 — ABNORMAL LOW (ref 7.350–7.450)
pO2, Arterial: 32.8 mmHg — CL (ref 83.0–108.0)

## 2019-07-27 LAB — MAGNESIUM: Magnesium: 1.8 mg/dL (ref 1.7–2.4)

## 2019-07-27 MED ORDER — FENTANYL BOLUS VIA INFUSION
50.0000 ug | INTRAVENOUS | Status: DC | PRN
  Filled 2019-07-27: qty 50

## 2019-07-27 MED ORDER — CHLORHEXIDINE GLUCONATE 0.12% ORAL RINSE (MEDLINE KIT)
15.0000 mL | Freq: Two times a day (BID) | OROMUCOSAL | Status: DC
  Administered 2019-07-27 – 2019-08-28 (×39): 15 mL via OROMUCOSAL

## 2019-07-27 MED ORDER — ROCURONIUM BROMIDE 10 MG/ML (PF) SYRINGE
PREFILLED_SYRINGE | INTRAVENOUS | Status: AC
  Filled 2019-07-27: qty 10

## 2019-07-27 MED ORDER — VECURONIUM BROMIDE 10 MG IV SOLR
INTRAVENOUS | Status: AC
  Filled 2019-07-27: qty 10

## 2019-07-27 MED ORDER — DOCUSATE SODIUM 50 MG/5ML PO LIQD
100.0000 mg | Freq: Two times a day (BID) | ORAL | Status: DC
  Administered 2019-07-27 – 2019-08-19 (×28): 100 mg via ORAL
  Filled 2019-07-27 (×35): qty 10

## 2019-07-27 MED ORDER — SUCCINYLCHOLINE CHLORIDE 200 MG/10ML IV SOSY
PREFILLED_SYRINGE | INTRAVENOUS | Status: AC
  Filled 2019-07-27: qty 10

## 2019-07-27 MED ORDER — IPRATROPIUM-ALBUTEROL 0.5-2.5 (3) MG/3ML IN SOLN
3.0000 mL | Freq: Four times a day (QID) | RESPIRATORY_TRACT | Status: DC
  Administered 2019-07-27 – 2019-07-30 (×11): 3 mL via RESPIRATORY_TRACT
  Filled 2019-07-27 (×11): qty 3

## 2019-07-27 MED ORDER — ALBUTEROL SULFATE (2.5 MG/3ML) 0.083% IN NEBU: 2.5000 mg | INHALATION_SOLUTION | RESPIRATORY_TRACT | Status: DC | PRN

## 2019-07-27 MED ORDER — FENTANYL CITRATE (PF) 100 MCG/2ML IJ SOLN
INTRAMUSCULAR | Status: AC
  Filled 2019-07-27: qty 2

## 2019-07-27 MED ORDER — FENTANYL CITRATE (PF) 100 MCG/2ML IJ SOLN
50.0000 ug | INTRAMUSCULAR | Status: DC | PRN
  Administered 2019-08-14: 100 ug via INTRAVENOUS
  Administered 2019-08-15 – 2019-08-17 (×7): 50 ug via INTRAVENOUS
  Administered 2019-08-17 – 2019-08-18 (×2): 100 ug via INTRAVENOUS
  Administered 2019-08-18 – 2019-08-19 (×4): 50 ug via INTRAVENOUS
  Administered 2019-08-19: 100 ug via INTRAVENOUS
  Administered 2019-08-19 – 2019-08-20 (×5): 50 ug via INTRAVENOUS
  Administered 2019-08-20 – 2019-08-23 (×14): 100 ug via INTRAVENOUS
  Administered 2019-08-23: 50 ug via INTRAVENOUS
  Administered 2019-08-23 – 2019-08-24 (×7): 100 ug via INTRAVENOUS
  Filled 2019-07-27 (×42): qty 2

## 2019-07-27 MED ORDER — ORAL CARE MOUTH RINSE: 15.0000 mL | OROMUCOSAL | Status: DC

## 2019-07-27 MED ORDER — CHLORHEXIDINE GLUCONATE 0.12% ORAL RINSE (MEDLINE KIT): 15.0000 mL | Freq: Two times a day (BID) | OROMUCOSAL | Status: DC

## 2019-07-27 MED ORDER — LIDOCAINE HCL (CARDIAC) PF 100 MG/5ML IV SOSY
PREFILLED_SYRINGE | INTRAVENOUS | Status: AC
  Filled 2019-07-27: qty 5

## 2019-07-27 MED ORDER — MIDAZOLAM HCL 2 MG/2ML IJ SOLN: 1.0000 mg | INTRAMUSCULAR | Status: DC | PRN

## 2019-07-27 MED ORDER — LACTATED RINGERS IV BOLUS
1500.0000 mL | Freq: Once | INTRAVENOUS | Status: AC
  Administered 2019-07-27: 1500 mL via INTRAVENOUS

## 2019-07-27 MED ORDER — PHENYLEPHRINE 40 MCG/ML (10ML) SYRINGE FOR IV PUSH (FOR BLOOD PRESSURE SUPPORT)
PREFILLED_SYRINGE | INTRAVENOUS | Status: AC
  Filled 2019-07-27: qty 10

## 2019-07-27 MED ORDER — ORAL CARE MOUTH RINSE
15.0000 mL | OROMUCOSAL | Status: DC
  Administered 2019-07-27 – 2019-08-28 (×142): 15 mL via OROMUCOSAL

## 2019-07-27 MED ORDER — PROPOFOL 1000 MG/100ML IV EMUL
0.0000 ug/kg/min | INTRAVENOUS | Status: DC
  Administered 2019-07-27: 10 ug/kg/min via INTRAVENOUS

## 2019-07-27 MED ORDER — PROPOFOL 500 MG/50ML IV EMUL
INTRAVENOUS | Status: AC
  Filled 2019-07-27: qty 50

## 2019-07-27 MED ORDER — STERILE WATER FOR INJECTION IJ SOLN
INTRAMUSCULAR | Status: AC
  Filled 2019-07-27: qty 10

## 2019-07-27 MED ORDER — ETOMIDATE 2 MG/ML IV SOLN
INTRAVENOUS | Status: AC
  Administered 2019-07-27: 20 mg
  Filled 2019-07-27: qty 20

## 2019-07-27 MED ORDER — MIDAZOLAM HCL 2 MG/2ML IJ SOLN
INTRAMUSCULAR | Status: AC
  Filled 2019-07-27: qty 4

## 2019-07-27 MED ORDER — POLYETHYLENE GLYCOL 3350 17 G PO PACK
17.0000 g | PACK | Freq: Every day | ORAL | Status: DC
  Administered 2019-07-27 – 2019-08-18 (×11): 17 g via ORAL
  Filled 2019-07-27 (×14): qty 1

## 2019-07-27 MED ORDER — FENTANYL 2500MCG IN NS 250ML (10MCG/ML) PREMIX INFUSION
50.0000 ug/h | INTRAVENOUS | Status: DC
  Administered 2019-07-27: 100 ug/h via INTRAVENOUS
  Filled 2019-07-27: qty 250

## 2019-07-27 MED ORDER — PANTOPRAZOLE SODIUM 40 MG PO PACK
40.0000 mg | PACK | ORAL | Status: DC
  Administered 2019-07-27 – 2019-08-27 (×32): 40 mg
  Filled 2019-07-27 (×34): qty 20

## 2019-07-27 MED ORDER — MIDAZOLAM HCL 2 MG/2ML IJ SOLN: 2.0000 mg | INTRAMUSCULAR | Status: DC | PRN

## 2019-07-27 NOTE — Significant Event (Signed)
Rapid Response Event Note  Overview: Call Time: 1300 Event Type: Respiratory  Initial Focused Assessment:  RRT called due to new onset respiratory distress and hypoxia post radiation treatment. On arrival patient noted to be tachypnic and labored respirations. Patient on 98% trach collar. O2 saturation 62%. Respiratory at bedside attempting suction with minimal bloody secretions. Patient noted to have new onset retractions and accessory muscle use. Per bedside RN all of this is new as well as increased work of breathing. Lung sounds diminished in all fields however no adventitious sounds. Patient placed on continuous pulse oximetry, telemetry, and BP monitoring. patient hypertensive, hypoxic in mid 70s, and tachycardic 110s. MD paged. Plan to transfer to ICU and place patient on ventilator. Intensivist notified of impending transfer and room number. On arrival to ICU patient trach exchange d to a cuffed version. Due to anatomy MD unable to get 6 cuffed trach in stoma. 4 cuffed placed instead. Patient placed on full ventilator support, FiO2 100%, PEEP 8. Patient now hypotensive, 1 litre of lactated ringers given over an hour. ENT called to re-attempt trach exchange. ENT cart and emergency airway supplies obtained. Patient on full vent support. Sedation started as ordered by MD. Lurline Idol exchange by ENT successful. Vital signs stable. Bleeding slowing down from stoma. Plan to remain on sedation for now for vent compliance. Family called and updated by MD. Plan to continue full support and maintain full code status.    Event Summary: Name of Physician Notified: Alfredia Ferguson. Jenetta Downer MD at 1300  Name of Consulting Physician Notified: Halford Chessman. V. MD at 1304  Outcome: Transferred (Comment)  Event End Time: [place]

## 2019-07-27 NOTE — Progress Notes (Signed)
Pt noted with  Large blood clots from the troch , suctioned, dressing changed, tolerated well. MD made aware. SRP, RN

## 2019-07-27 NOTE — Progress Notes (Signed)
   07/27/19 1252  Assess: MEWS Score  Temp (!) 97.3 F (36.3 C)  BP 100/82  Pulse Rate (!) 110  Resp (!) 33  SpO2 (!) 81 %  O2 Device Tracheostomy Collar  O2 Flow Rate (L/min) 5 L/min  FiO2 (%) 28 %  Assess: MEWS Score  MEWS Temp 0  MEWS Systolic 1  MEWS Pulse 1  MEWS RR 2  MEWS LOC 0  MEWS Score 4  MEWS Score Color Red  Assess: if the MEWS score is Yellow or Red  Were vital signs taken at a resting state? Yes  Focused Assessment Documented focused assessment  Early Detection of Sepsis Score *See Row Information* Low  MEWS guidelines implemented *See Row Information* Yes (RRT and resp at bedside)  Treat  MEWS Interventions Administered prn meds/treatments;Escalated (See documentation below);Consulted Respiratory Therapy  Take Vital Signs  Increase Vital Sign Frequency  Red: Q 1hr X 4 then Q 4hr X 4, if remains red, continue Q 4hrs  Escalate  MEWS: Escalate Red: discuss with charge nurse/RN and provider, consider discussing with RRT  Notify: Charge Nurse/RN  Name of Charge Nurse/RN Notified Marissa Long RN  Date Charge Nurse/RN Notified 07/27/19  Time Charge Nurse/RN Notified 1255  Notify: Provider  Provider Name/Title Dr Alfredia Ferguson  Date Provider Notified 07/27/19  Time Provider Notified 1255  Notification Type Page  Notification Reason Change in status  Notify: Rapid Response  Name of Rapid Response RN Notified Zoe RN  Date Rapid Response Notified 07/27/19  Time Rapid Response Notified 2563   RRT and Resp Therapy at bedside

## 2019-07-27 NOTE — Progress Notes (Signed)
Notified Lab that ABG being sent for analysis. 

## 2019-07-27 NOTE — TOC Progression Note (Signed)
Transition of Care [facility]) - Progression Note    Patient Details  Name: Devon Peterson MRN: [id number] Date of Birth: 11-Jan-1956  [facility] CM/SW Contact  Joaquin Courts, RN Phone Number: 07/27/2019, 11:39 AM  Clinical Narrative:   CM followed up with [facility] rep and [facility] rep regarding possibility of extending bed offer to patient.  Both facility reps report they have to review the information and speak with their leadership before they are able to accept or decline this patient.  CM will await follow-up from reps.  No other bed offers at this time, patient remains without SNF options at this time.     Expected Discharge Plan: Skilled Nursing Facility Barriers to Discharge: Continued Medical Work up  Expected Discharge Plan and Services Expected Discharge Plan: [place] Acute Care Choice: [place]                                         Social Determinants of Health (SDOH) Interventions    Readmission Risk Interventions Readmission Risk Prevention Plan 07/27/2019  Transportation Screening Complete  HRI or [place] Complete  Social Work Consult for [place] Planning/Counseling Complete  Palliative Care Screening Complete  Medication Review [employer]) Complete  Some recent data might be hidden

## 2019-07-27 NOTE — Progress Notes (Signed)
[id number] During rounding, noticed pt coughing bloody sputum, suctioned patient, pt continue to cough, pt sats 5 l trach collar, 88-90%. Pt suctioned again at 0800; pt settled during bath sats remained 89% on 5 liters. At [id number] pt had another episode of bloody sputum. Pt sats on 5 liters 88%, MD updated on status. Pt to Radiation at 1130, stable at the time. 1305, pt returned from Radiation desating  70's -80's noted, pt respirations 30's, Changed probe on finger, pt does not appear to have acute distress. Pt is calm in bed, states he may have some difficult breathing. Stayed with pt at bedside, CN assist, Resp arrived, assisted pt and checked sats and re-positioned probe, unable to capture, RR response place low sensitive plus probe and sat noted to be in the 78-83%, pt changed to non-re breather; MD updated by RR RN, pt transferred ICU, report called to Ubaldo Glassing, Therapist, sports. SRP, RN

## 2019-07-27 NOTE — Progress Notes (Signed)
PROGRESS NOTE    Devon Peterson  [id number] DOB: [birth year] DOA: 07/07/2019 PCP: Patient, No Pcp Per  Brief Narrative:  HPI per Dr. Shela Leff on 07/07/19 Devon Peterson a 64 y.o.malewith medical history significant ofCOPD,polysubstance abuse (alcohol, tobacco, cocaine), hospital admission 04/17/2019-04/19/2019 for sepsis secondary to pneumonia and COPD exacerbationpresenting to the ED via EMS for evaluation of cough, shortness of breath, fatigue, and unintentional weight loss.History provided by patient and his nephew at bedside. Nephew states patient was admitted to the hospital a month ago for pneumonia and since then has continued to do poorly. He has difficulty swallowing food and has been spitting up saliva. He is coughing a lot. Patient denies fevers, chills, shortness of breath, chest pain, nausea, vomiting, abdominal pain, diarrhea, or dysuria. Nephew states patient has been smoking a pack of cigarettes daily since a very young age. He has lost a lot of weight recently. Addendum: 07/07/2019 8:40 PM. Patient also denied history of hematemesis, hematochezia, or melena.  ED Course:Tachycardic on arrival with heart rate in the 150s, appeared to be sinus rhythm. Afebrile. Slightly tachypneic. Labs showing no leukocytosis. Initial lactic acid 2.5, repeat after IV fluid pending. Hemoglobin 12.7, stable compared to labs done in February 2021. Platelet count normal. BUN 27, creatinine 0.8. Albumin 3.3. LFTs normal. Blood culture x2 pending. SARS-CoV-2 PCR test negative. Influenza panel negative. UA not suggestive of infection. Urine culture pending. Chest x-ray showing new left lower lobe airspace opacity concerning for pneumonia. CT angiogram chest negative for PE. Showing abnormal soft tissue in the hypopharynx suspicious for underlying mass. Dense bilateral lower lobe consolidation, left greater than right. Aspiration suspected given findings in the hypopharynx.  Also showing evidence of an enhancing mass within the densely consolidated left lower lobe, metastatic disease or primary neoplasm not excluded. Marked cachexia.  Patient received ceftriaxone, azithromycin, and 2 L normal saline boluses.  **Interim History He was evaluated by ENT who recommended a tracheostomy.  Tracheostomy was done on 07/10/19 and postoperatively he ended up on pressors which were weaned off.  He is also getting a tissue biopsy and medical oncology as well as radiation oncology were consulted for further evaluation.  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 Postoperatively he had hypotension and went in A. fib with RVR and started on Neo-Synephrine drip has been stopped.  Critical care was consulted for further evaluation and his hypotension is improved.    Underwent PEG tube placement on 07/14/19 and nutrition is recommending that once the PEG is placed starting the patient on Osmolite 1.520 mL/h to advance by 10 mL every 12 hours to reach a goal rate of 50 mL/h with Protostat 30 mL once daily. Underwent PMV evaluation and is doing well. Has not had a BM since 07/14/19 so will start bowel regimen. PT/OT recommending SNF still and likely will be discharged when bed is available and is accepted.  Per my discussion with the transition of care manager placement may be an issue due to his tracheostomy status and given his Medicaid pending insurance.. Referral has been made to the Oncology Nurse Navigator. His Trach Tube was changed to a #6 cuffless [place] during this hospitalization as well. Palliative Care was consulted and he wishes to remain a FULL CODE. Radiation Oncology evaluated and he is a candidate for Radiotherapy so has started treatments.  07/24/19: Nursing and speech therapy felt the patient was sneaking cookies even though he is n.p.o.  He did ask for soda and had some overt signs or symptoms of  aspiration after that when witnessed by the speech therapist.   Skilled placement is difficult.  Palliative Care to readdress goals of care if patient wants to eat   07/25/2019: Is doing okay today and denies any complaints.  I discussed with him about sneaking cookies and he understands agrees and states that he will not do it again.  Patient in SNF is still an issue but he is stable to be transferred from the stepdown unit to the medical floor currently.  07/26/2019: Overnight he started having some significant amount of blood being expectorated from his tracheostomy tube.  He had to be suctioned and cleaned and had a very large inner cannula clot.  Today he is a little bit more sleepy.  Still has been sneaking cookies despite me advising him not to.  Palliative care to readdress goals of care today and we have reconsulted ENT to evaluate his blood coming from his tracheostomy.  Hemoglobin remains relatively stable despite having that much blood and has tracheostomy tube.  Currently saturating fine on the ATC.  ENT evaluated and Dr. Constance Holster passed a fiberoptic scope to his tracheostomy down the carina and the trachea looks clear but he stated the mucosa looked irritated and dry and he recommended increasing the amount of the humidification to the trach collar  07/27/2019: Today he had another episode of bleeding from his trach and had a very large blood clot and hemoptysis.  I called ENT Dr. Wilburn Cornelia who recommended just observe.  Subsequently later in the afternoon he became acutely hypoxic and had respiratory distress with saturations in the 50s.  He was transferred to the stepdown unit immediately and critical care came to see the patient and they ended up changes cuffless trach to a #4 cuffed trach and he did have some cuff leak.  He was placed back on the ventilator and went into A. fib with RVR.  Patient was transferred to critical care service now that he is ventilated.  He was reconsulted for exchange of the his tracheostomy and Dr. Wilburn Cornelia will be by later  today.  Assessment & Plan:   Principal Problem:   Aspiration pneumonia ([place]) Active Problems:   Sepsis ([place])   Dysphagia   Severe protein-calorie malnutrition ([place])   Alcohol use   Pressure injury of skin   Lung mass   Neck mass   Tracheostomy care ([place])   Squamous cell carcinoma of neck   Palliative care encounter  Sepsis 2/2 to Aspiration Pneumonia -Completed course of antibiotics -C/w Albuterol neb TID -Robinul IV 0.1 mg BID -DuoNeb PRN -CXR the day before (07/25/19) showed "Stable hazy bilateral lower chest opacification correlating with pneumonia on recent chest CT. There is also layering pleural fluid." -He had witnessed aspiration events after smoke a cookie and drank some soda; reinforced that he is n.p.o. and will not be given any food and will need to watch for memory to bring him food -Reinforce strict n.p.o. 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 Wilburn Cornelia ENT placed tracheostomy -5/13 oncology sent PD-L1 testing on pathology. -5/13 Recommend a course ofpalliative ordefinitive radiation. Radiation oncology consult has been completed. Awaiting final recommendations. -5/13 oncology Will follow up on the lung mass and determine further work-up at a future date. -Outpatient follow-up at the cancer center -Continued thick secretions requiring frequent suctioning. -5/14Dr.  Junior ENT upsized trach to #6 cuffless to help with secretions. -5/14 continue hypertonic saline nebulizer daily followed by albuterol nebulizer treatment -5/18 per Dr. Micheline Rough palliative care note plan for patient's care is as following;             -Full code/full scope             -Plan for 4-week course of radiation followed by reevaluation for potential for systemic  therapy LEFT lung mass -Suspicious for neoplasm either second primary or metastatic.   -Currently undergoing Radiation Treatments and underwent radiation treatment yesterday -C/w PMV and continue with speech therapy -Placement is still difficult and he still will follow up with oncology Dr. Benay Spice in the outpatient setting -Reconsulted ENT Dr. Constance Holster due to blood coming from his Tracheostomy and Clots in his inner cannula; Dr. Constance Holster did a laryngoscope and felt that the bleeding was from dry mucosa  Hemorrhage in Trachestomy  -Re-consulted ENT -Had it start Last night -Respiratory therapist suctioned. -ENT Dr. Constance Holster evaluated and he passed a fiberoptic scope to his tracheostomy down to the carina but he is felt that the trachea looks clear.  The mucosal look irritated and dry he recommended increasing the amount of humidification to the trach collar and recommended reconsulting if there is any additional bleeding problems -He had another bleeding issue this morning and I reconsulted Dr. Wilburn Cornelia who decides to observe.  Subsequently the patient ended up becoming acutely hypoxic and in respiratory distress later transferred to the intensive care unit and was placed back on the ventilator and he was transferred to critical care service  COPD -See Above  Dysphagia -5/11 PEG tube placed -Osmolite 1.5 @ 20 ml/hr to advance by 10 ml every 12 hours to reach goal rate of 50 ml/hr and now it is to be changed to bolus feedings and he will get one carton of Osmolite 1.55 times a day with 30 mL of Po stat once a day  and 120 mL free water per tube feeding bolus; continue to monitor carefully now that he is on bolus feedings and has some hypoglycemia episode -30 ml prostat once/day. -AGAIN Advised against any oral intake currently   A. fib with RVR -Went into A. fib with RVR in the PACU and was given IV metoprolol with improvement -Heart rates have improved some but he is hypotensive and had to be placed on a Neo-Synephrine drip which was weaned off -We will not anticoagulate at this time; he has now on heparin subcu -5/16 currently NSR -07/27/2019 back in A. fib with RVR due to respiratory distress  Hypotension -Further care per PCCM given that he is now hypotensive and in A. fib with RVR likely in the setting of his respiratory distress -They will be ordering a bolus of LR for the patient  Severe protein calorie malnutrition/Underweight -Estimated body mass index is 13.53 kg/m as calculated from the following:   Height as of this encounter: '5\' 11"'$  (1.803 m).   Weight as of this encounter: 44 kg. -See Above  as he is now getting bolus tube feeds  Generalized weakness/physical deconditioning: -PT and OTinitialevaluation recommending Home Health PTbut re-evaluation recommending SNF now given his condition; SNF Placement pending and he is a difficult placement  EtOH Abuse -Folic acid 1 mg daily -Thiamine 100 mg daily -Currently no signs or symptoms of withdrawal.  Cocaine abuse -5/5 Positive cocaine -Avoid beta-blockers, although if absolutely required for control of HTN this far out would be safe  Tobacco abuse -NicoDerm patch -Counseled on need for absolute cessation  Hypoglycemia -In the setting of poor p.o. intake and was persistent; Became Hypoglycemic again -CBG's ranging from 76-139 -Discontinued D10  Hypokalemia -Potassium goal> 4 -K+ was 4.5 this AM -Continue to Monitor and Replete as Necessary   Hypomagnesmia -Magnesium goal> 2 -Mag Level is now 1.8 -Continue  to Monitor and Replete as Ncessary  Hypophosphatemia -Improved -Phos Level is now 2.9 -Continue to Monitor and Replete as Necessary   Hyponatremia -Normal saline 24m/hr will now be D/C'd  -Na+has been fluctuating and was 132 -> 133 -> 131 -> 135 -> 134 -> 135 -> 135 -5/16 NaCl 2 g TID (hold)  Normocytic Anemia -Patient's hemoglobin/hematocit is stable and is 8.7/27.1 despite him bleeding but will need a repeat CBC now that he is in ICU -Check anemia panel and showed an iron level of 30, U IBC 130, TIBC 150, saturation ratios of 19%, ferritin level 421, folate level 11.5, and vitamin B12 of 281 -Continue to monitor for signs and symptoms of bleeding; currently had a lot of bleeding from his trach site to be injected with lidocaine  Stage II sacral pressure ulcer (POA Pressure Injury 07/07/19 Sacrum Mid;Upper Stage 2 -  Partial thickness loss of dermis presenting as a shallow open injury with a red, pink wound bed without slough. 3 each small separate open areas (Active)  07/07/19 2107  Location: Sacrum  Location Orientation: Mid;Upper  Staging: Stage 2 -  Partial thickness loss of dermis presenting as a shallow open injury with a red, pink wound bed without slough.  Wound Description (Comments): 3 each small separate open areas  Present on Admission: Yes  -Continue with Mepilex pads and frequent turning  Goals of Care 5/13 palliative care consult; patient with poorly differentiated squamous carcinoma, extremely cachectic now trach and PEG dependent.  Evaluate for change of CODE STATUS, consider hospice -- PT OT recommending SNF.  Will await palliative care recommendations -5/17 PT/OT update consult; in order to place patient per NCM need an updated consult recommending SNF; Now done and still Pending SNF Placement   Constipation -Start Bowel Regimen and this is improved as he is having bowel movements now -Tinea with bowel regimen and continue to monitor   DVT prophylaxis:  SCDs; Will add Heparin sq however this will be held given his bleeding from his tracheostomy Code Status: FULL CODE: Palliative Care goals of care this week and he still remains full code Family Communication: No family present at bedside but updated sister in the telephone Disposition Plan: Anticipate D/C to SNF when bed is available; Currently no Bed Available and is difficult to be placed: He has been stable and transferred out of the stepdown unit to the medical floor but now is back in ICU on the ventilator  Status is: Inpatient  Remains inpatient appropriate because:Inpatient level of care appropriate due to severity of illness   Dispo: The patient is from: Home              Anticipated d/c is  to: SNF when bed is available               Anticipated d/c date is: > 3 days              Patient currently is not medically stable to d/c.  Consultants:   ENT  Medical Oncology  Radiation Oncology  PCCM/Pulmonary  Palliative Care Medicine    Procedures:  5/4 CTA chest W contrast;-negative PE. - Abnormal soft tissue in the hypopharynx, suspicious for underlying mass. CT neck or direct visual inspection recommended for further evaluation. - Dense bilateral lower lobe consolidation, LEFT >> RIGHT . Aspiration is suspected given findings in the hypopharynx. -Evidence of an enhancing mass within the densely consolidated left lower lobe, metastatic disease or primary neoplasm not excluded.  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 Redmond Baseman ENT  upsized #6 cuffless trach>>>   Antimicrobials:  Anti-infectives (From admission, onward)   Start     Dose/Rate Route Frequency Ordered Stop   07/07/19 2200  ampicillin-sulbactam (UNASYN) 1.5 g in sodium chloride 0.9 % 100 mL IVPB     1.5 g 200 mL/hr over 30 Minutes Intravenous Every 6 hours 07/07/19 2051 07/14/19 1606   07/07/19 1930  metroNIDAZOLE (FLAGYL) IVPB 500 mg  Status:  Discontinued     500 mg 100 mL/hr over 60 Minutes Intravenous  Once 07/07/19 1922 07/07/19 2003   07/07/19 1800  cefTRIAXone (ROCEPHIN) 1 g in sodium chloride 0.9 % 100 mL IVPB     1 g 200 mL/hr over 30 Minutes Intravenous  Once 07/07/19 1750 07/07/19 1830   07/07/19 1800  azithromycin (ZITHROMAX) 500 mg in sodium chloride 0.9 % 250 mL IVPB     500 mg 250 mL/hr over 60 Minutes Intravenous  Once 07/07/19 1750 07/07/19 1855     Subjective: Seen and examined this AM and he was having some more bleeding from his tracheostomy when I saw him it been suction and it was in the canister.  Bleeding had stopped and he had blood clots that were suctioned.  He stated that he is a little labored breathing.  Is also feeling cold in a while that he increase.  Did not feel very well just felt fatigued and felt that he had a little bit worsening breathing.  No nausea or vomiting.  His saturations were fine this morning however subsequently decompensated and had saturations in the 50s and he was transferred to the ICU and placed on the ventilator per critical care.  Further care per them.  Objective: Vitals:   07/27/19 0731 07/27/19 1238 07/27/19 1252 07/27/19 1300  BP:   100/82   Pulse: 86 (!) 109 (!) 110   Resp: 18 (!) 24 (!) 33   Temp:   (!) 97.3 F (36.3 C)   TempSrc:   Rectal   SpO2: 97% (!) 82% (!) 81%   Weight:      Height:    '5\' 11"'$  (1.803 m)    Intake/Output Summary (Last 24 hours) at 07/27/2019 1350 Last data filed at 07/27/2019 0300 Gross per 24 hour  Intake 357 ml  Output 1350 ml  Net -993 ml  Filed  Weights   07/07/19 2049 07/09/19 0949 07/20/19 1600  Weight: 42 kg 42 kg 44 kg   Examination: Physical Exam:  Constitutional: The patient is a frail cachectic thin African-American male who is in no acute distress this morning but he feels like he is having little bit more labored breathing and does not feel as well it did yesterday.  Denies any pain., NAD and appears calm and comfortable Eyes: Lids and conjunctivae normal, sclerae anicteric  ENMT: External Ears, Nose appear normal. Grossly normal hearing.  Neck: Has a tracheostomy in place and his Passy-Muir valve is out.  He is connected to ATC and he does have some dried blood and had been recently suctioned and had blood clots and blood in his canister Respiratory: Diminished to auscultation bilaterally with coarse breath sounds worse on the left compared to right but no appreciable wheezing, rales or rhonchi. Normal respiratory effort and patient is not tachypenic.  Wearing a TCA and has unlabored breathing currently Cardiovascular: RRR, no murmurs / rubs / gallops. S1 and S2 auscultated. No extremity edema.  Abdomen: Soft, non-tender, non-distended.  PEG tube is in place. bowel sounds positive.  GU: Deferred. Musculoskeletal: No clubbing / cyanosis of digits/nails. No joint deformity upper and lower extremities.  Skin: No rashes, lesions, ulcers on limited skin evaluation. No induration; Warm and dry.  Neurologic: CN 2-12 grossly intact with no focal deficits. Romberg sign and cerebellar reflexes not assessed.  Psychiatric: Normal judgment and insight. Alert and oriented x 3.  Fatigued appearing and slightly depressed mood and appropriate affect.   Data Reviewed: I have personally reviewed following labs and imaging studies  CBC: Recent Labs  Lab 07/24/19 1136 07/25/19 0400 07/25/19 2126 07/26/19 0415 07/27/19 0435  WBC 4.1 4.8 4.4 4.2 5.0  NEUTROABS 2.1 2.8 2.4 2.3 3.3  HGB 8.7* 8.8* 8.4* 8.8* 8.7*  HCT 26.5* 26.3* 25.7* 26.8*  27.1*  MCV 86.0 85.9 86.2 86.2 86.0  PLT 359 333 296 323 373   Basic Metabolic Panel: Recent Labs  Lab 07/23/19 0337 07/24/19 1136 07/25/19 0400 07/26/19 0415 07/27/19 0435  NA 131* 135 134* 135 135  K 4.2 4.6 4.5 4.7 4.5  CL 97* 97* 96* 95* 95*  CO2 27 32 '30 31 30  '$ GLUCOSE 126* 82 79 81 86  BUN '12 12 13 15 16  '$ CREATININE 0.37* 0.41* 0.44* 0.40* 0.46*  CALCIUM 7.9* 8.4* 8.7* 8.9 8.9  MG 2.1 1.6* 2.2 1.7 1.8  PHOS 2.7 3.3 3.1 2.9 2.9   GFR: Estimated Creatinine Clearance: 58.1 mL/min (A) (by C-G formula based on SCr of 0.46 mg/dL (L)). Liver Function Tests: Recent Labs  Lab 07/23/19 0337 07/24/19 1136 07/25/19 0400 07/26/19 0415 07/27/19 0435  AST 34 36 40 46* 34  ALT 28 30 38 49* 40  ALKPHOS 68 72 75 73 74  BILITOT 0.3 0.3 0.3 0.4 0.2*  PROT 5.6* 6.4* 6.6 6.6 6.9  ALBUMIN 2.3* 2.7* 2.8* 2.8* 3.1*   No results for input(s): LIPASE, AMYLASE in the last 168 hours. No results for input(s): AMMONIA in the last 168 hours. Coagulation Profile: No results for input(s): INR, PROTIME in the last 168 hours. Cardiac Enzymes: No results for input(s): CKTOTAL, CKMB, CKMBINDEX, TROPONINI in the last 168 hours. BNP (last 3 results) No results for input(s): PROBNP in the last 8760 hours. HbA1C: No results for input(s): HGBA1C in the last 72 hours. CBG: Recent Labs  Lab 07/26/19 2015 07/27/19 0001 07/27/19 0406 07/27/19 4287 07/27/19 1230  GLUCAP 107* 139* 79 76 100*   Lipid Profile: No results for input(s): CHOL, HDL, LDLCALC, TRIG, CHOLHDL, LDLDIRECT in the last 72 hours. Thyroid Function Tests: No results for input(s): TSH, T4TOTAL, FREET4, T3FREE, THYROIDAB in the last 72 hours. Anemia Panel: No results for input(s): VITAMINB12, FOLATE, FERRITIN, TIBC, IRON, RETICCTPCT in the last 72 hours. Sepsis Labs: No results for input(s): PROCALCITON, LATICACIDVEN in the last 168 hours.  Recent Results (from the past 240 hour(s))  SARS CORONAVIRUS 2 (TAT 6-24 HRS)  Nasopharyngeal Nasopharyngeal Swab     Status: None   Collection Time: 07/20/19  4:35 PM   Specimen: Nasopharyngeal Swab  Result Value Ref Range Status   SARS Coronavirus 2 NEGATIVE NEGATIVE Final    Comment: (NOTE) SARS-CoV-2 target nucleic acids are NOT DETECTED. The SARS-CoV-2 RNA is generally detectable in upper and lower respiratory specimens during the acute phase of infection. Negative results do not preclude SARS-CoV-2 infection, do not rule out co-infections with other pathogens, and should not be used as the sole basis for treatment or other patient management decisions. Negative results must be combined with clinical observations, patient history, and epidemiological information. The expected result is Negative. Fact Sheet for Patients: SugarRoll.be Fact Sheet for Healthcare Providers: https://www.woods-mathews.com/ This test is not yet approved or cleared by the [place] FDA and  has been authorized for detection and/or diagnosis of SARS-CoV-2 by FDA under an Emergency Use Authorization (EUA). This EUA will remain  in effect (meaning this test can be used) for the duration of the COVID-19 declaration under Section 56 4(b)(1) of the Act, 21 U.S.C. section 360bbb-3(b)(1), unless the authorization is terminated or revoked sooner. Performed at [facility], [address] [place], [place] [address]     RN Pressure Injury Documentation: Pressure Injury 07/07/19 Sacrum Mid;Upper Stage 2 -  Partial thickness loss of dermis presenting as a shallow open injury with a red, pink wound bed without slough. 3 each small separate open areas (Active)  07/07/19 2107  Location: Sacrum  Location Orientation: Mid;Upper  Staging: Stage 2 -  Partial thickness loss of dermis presenting as a shallow open injury with a red, pink wound bed without slough.  Wound Description (Comments): 3 each small separate open areas  Present on Admission:  Yes     Estimated body mass index is 13.53 kg/m as calculated from the following:   Height as of this encounter: '5\' 11"'$  (1.803 m).   Weight as of this encounter: 44 kg.  Malnutrition Type:  Nutrition Problem: Severe Malnutrition Etiology: chronic illness(COPD)   Malnutrition Characteristics:  Signs/Symptoms: severe fat depletion, severe muscle depletion   Nutrition Interventions:  Interventions: Tube feeding, Prostat   Radiology Studies: DG CHEST PORT 1 VIEW  Result Date: 07/26/2019 CLINICAL DATA:  Shortness of breath, tracheostomy EXAM: PORTABLE CHEST 1 VIEW COMPARISON:  07/25/2019 FINDINGS: The heart size and mediastinal contours are within normal limits. Tracheostomy. Possible small layering pleural effusions. The visualized skeletal structures are unremarkable. IMPRESSION: Possible small layering pleural effusions. No acute appearing airspace opacity. Electronically Signed   By: Eddie Candle M.D.   On: 07/26/2019 15:17   Scheduled Meds: . albuterol  2.5 mg Nebulization Daily  . chlorhexidine  15 mL Mouth Rinse BID  . chlorhexidine gluconate (MEDLINE KIT)  15 mL Mouth Rinse BID  . Chlorhexidine Gluconate Cloth  6 each Topical Daily  . feeding supplement (OSMOLITE 1.5 CAL)  237 mL Per Tube 5 X Daily  . feeding supplement (PRO-STAT SUGAR FREE  64)  30 mL Per Tube Daily  . free water  120 mL Per Tube 5 X Daily  . glycopyrrolate  1 mg Per Tube BID  . heparin injection (subcutaneous)  5,000 Units Subcutaneous Q8H  . mouth rinse  15 mL Mouth Rinse q12n4p  . mouth rinse  15 mL Mouth Rinse 10 times per day  . polyethylene glycol  17 g Oral BID  . senna-docusate  1 tablet Oral BID  . thiamine  100 mg Per Tube Daily   Continuous Infusions:   LOS: 20 days   Kerney Elbe, DO [facility] PAGER is on AMION  If 7PM-7AM, please contact night-coverage [email or web address]

## 2019-07-27 NOTE — Progress Notes (Signed)
During rounding, noticed pt coughing bloody sputum, suctioned patient, pt continue to cough, pt sats 5 l trach collar, 88-90%. Pt suctioned again at 0800; pt settled during bath sats remained 89% on 5 liters. At [phone] pt had another episode of bloody sputum. Pt sats on 5 liters 88%, MD updated on status. Pt to Radiation at 1130, stable at the time. 1305, pt returned from Radiation desating  70's -80's noted, pt respirations 30's, Changed probe on finger, pt does not appear to have acute distress. Pt is calm in bed, states he may have some difficult breathing. Stayed with pt at bedside, CN assist, Resp arrived, assisted pt and checked sats and re-positioned probe, unable to capture, RR response place low sensitive plus probe and sat noted to be in the 78-83%, pt changed to non-re breather; MD updated by RR RN, pt transferred ICU, report called to Ubaldo Glassing, Therapist, sports. SRP, RN

## 2019-07-27 NOTE — Progress Notes (Signed)
ENT Progress Note: POD #18 s/p Procedure(s): DIRECT LARYNGOSCOPY WITH BIOPSY TRACHEOSTOMY   Subjective: Recent hemoptysis, patient with respiratory failure requiring repeat ventilation  Objective: Vital signs in last 24 hours: Temp:  [97.3 F (36.3 C)-98.8 F (37.1 C)] 97.5 F (36.4 C) (05/24 1423) Pulse Rate:  [25-110] 25 (05/24 1500) Resp:  [16-33] 21 (05/24 1500) BP: (100-149)/(73-94) 117/85 (05/24 1500) SpO2:  [66 %-99 %] 66 % (05/24 1500) FiO2 (%):  [28 %-100 %] 100 % (05/24 1335) Weight change:  Last BM Date: 07/25/19  Intake/Output from previous day: 05/23 0701 - 05/24 0700 In: 357 [NG/GT:357] Out: 2000 [Urine:2000] Intake/Output this shift: Total I/O In: 717.3 [I.V.:0.3; NG/GT:717] Out: 650 [Urine:650]  Labs: Recent Labs    07/26/19 0415 07/27/19 0435  WBC 4.2 5.0  HGB 8.8* 8.7*  HCT 26.8* 27.1*  PLT 323 324   Recent Labs    07/26/19 0415 07/27/19 0435  NA 135 135  K 4.7 4.5  CL 95* 95*  CO2 31 30  GLUCOSE 81 86  BUN 15 16  CALCIUM 8.9 8.9    Studies/Results: DG CHEST PORT 1 VIEW  Result Date: 07/27/2019 CLINICAL DATA:  Tracheostomy, respiratory failure EXAM: PORTABLE CHEST 1 VIEW COMPARISON:  07/26/2019 FINDINGS: Tracheostomy in satisfactory position. Patient is rotated. Mild left perihilar/infrahilar soft tissue prominence, favoring pneumonia. Small left pleural effusion. Right lung is clear, noting minimal right basilar atelectasis versus pneumonia. No pneumothorax. The heart is normal in size. IMPRESSION: Suspected left upper and lower lobe pneumonia. Small left pleural effusion. Mild right basilar atelectasis versus pneumonia. Electronically Signed   By: Julian Hy M.D.   On: 07/27/2019 13:56   DG CHEST PORT 1 VIEW  Result Date: 07/26/2019 CLINICAL DATA:  Shortness of breath, tracheostomy EXAM: PORTABLE CHEST 1 VIEW COMPARISON:  07/25/2019 FINDINGS: The heart size and mediastinal contours are within normal limits. Tracheostomy.  Possible small layering pleural effusions. The visualized skeletal structures are unremarkable. IMPRESSION: Possible small layering pleural effusions. No acute appearing airspace opacity. Electronically Signed   By: Eddie Candle M.D.   On: 07/26/2019 15:17     PHYSICAL EXAM: No evidence of active peristomal bleeding, #4 cuffed Shiley tracheostomy tube in place and patent.  Patient currently on ventilatory support in the [facility] long ICU.  Procedure: Patient evaluated at the bedside, after discussion with Dr. Halford Chessman of critical care we opted to undertake tracheal dilation and placement of a #6 Shiley cuffed tracheostomy tube to facilitate airway management.  Patient sedated for comfort during procedure, no local anesthetic required.  #4 Shiley tracheostomy tube removed without difficulty, percutaneous tracheostomy set used to dilate the patient's trachea stoma and a #6 Shiley cuffed tracheostomy tube was inserted without difficulty or trauma, no bleeding and the patient's airway was stable.  Good oxygenation and ventilation after procedure.  Assessment/Plan: Patient with new onset hemoptysis, etiology unclear.  He was seen by Dr. Constance Holster who performed flexible laryngoscopy at the bedside, no evidence of active bleeding but the patient has had several episodes over the last 48 hours of acute bleeding and coughing of clotted blood.  He underwent scheduled radiation therapy earlier today and decompensated.  Patient transferred to the intensive care unit and placed on ventilatory support with rebound and oxygenation.  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  The patient tolerated the bedside procedure without difficulty and a #6  Shiley cuffed tracheostomy tube was inserted without difficulty, good gas exchange and oxygenation.  The  patient stoma appeared to be intact and stable without granulation tissue or obvious bleeding source.  Continue airway bleeding may be related to his primary tumor versus possible pulmonary bleeding related to his lung process.  Continue current critical care therapy and monitor for additional symptoms.  Please reconsult as needed.    Devon Peterson 07/27/2019, 3:46 PM

## 2019-07-27 NOTE — Progress Notes (Signed)
Collected and delivered ordered sputum sample- RN aware.

## 2019-07-27 NOTE — Progress Notes (Signed)
NAME:  Devon Peterson, MRN:  [id number], DOB:  1955-06-28, LOS: 75 ADMISSION DATE:  07/07/2019, CONSULTATION DATE:  07/09/19  REFERRING MD:  Alfredia Ferguson, CHIEF COMPLAINT:  Post-operative hypotension   Brief History   64 yo male smoker admitted 5/04 with aspiration pneumonia and found to have hypopharyngeal mass.  Had direct laryngoscopy with biopsy and tracheostomy on 5/06.  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      Past Medical History  COPD, Cocaine abuse  Significant Hospital Events   5/4 admitted 5/5 seen by ENT as initial evaluation underwent flexible laryngoscopy 5/6 to operating room for direct laryngoscopy and biopsy, and tracheostomy placement pulmonary asked to evaluate postop for hypotension 5/11 G tube placed 5/23 tracheostomy bleeding >> no interventions needs 5/24 transfer to ICU for respiratory distress; placed on ventilator and changed to #4 cuffed trach (unable to pass #6 cuffed trach)   Consults:  ENT Oncology Radiation oncology Palliative care  Procedures:  Tracheostomy 5/06 >> G tube 5/11 >>   Significant Diagnostic Tests:  CT angio chest 5/04 >> atherosclerosis, severe emphysema, b/l lower lobe consolidation Lt > Rt, 2.7 cm mass LLL CT neck 5/05 >> enhancing hypopharyngeal soft tissue in post cricoid region  Micro Data:  SARS CoV2 PCR 5/04 >> negative Blood 5/04 >> negative Sputum 5/24 >>  Antimicrobials:  Unasyn 5/04 >> 5/11  Interim history/subjective:  Increased WOB and hypoxia earlier today.  Transferred to ICU.  Attempted to place #6 cuffed trach through stoma, but not able to pass.  Easily able to insert #4 cuffed trach.  Placed on ventilator with improvement in oxygenation and WOB, but had air leak around tracheostomy.  Objective   Blood pressure 100/82, pulse (!) 110, temperature (!) 97.3 F (36.3 C), temperature source Rectal, resp. rate (!) 33, height 5\' 11"  (1.803 m), weight 44 kg, SpO2 (!) 81 %.    Vent Mode: PRVC FiO2  (%):  [28 %-100 %] 100 % Set Rate:  [16 bmp-28 bmp] 16 bmp Vt Set:  [600 mL] 600 mL PEEP:  [5 cmH20] 5 cmH20 Plateau Pressure:  [24 cmH20] 24 cmH20   Intake/Output Summary (Last 24 hours) at 07/27/2019 1422 Last data filed at 07/27/2019 0300 Gross per 24 hour  Intake 357 ml  Output 1350 ml  Net -993 ml   Filed Weights   07/07/19 2049 07/09/19 0949 07/20/19 1600  Weight: 42 kg 42 kg 44 kg    Examination: General: Adult male, cachectic, appears older then stated age, resting in bed, in NAD. Neuro: Awake, mouths words, follows basic commands. HEENT: [place]/AT. Sclerae anicteric. Trach C/D/I. Cardiovascular: RR, no M/R/G.  Lungs: Respirations even and unlabored.  CTA bilaterally, No W/R/R. Abdomen: BS x 4, soft, NT/ND.  Musculoskeletal: No gross deformities, no edema.  Skin: Intact, warm, no rashes.  Resolved problems:  Aspiration pneumonia  Assessment & Plan:   Acute hypoxic respiratory failure. COPD with emphysema. - changed to #4 cuffed tracheostomy on 5/24 >> hospitalist called ENT to assess for changing to #6 cuffed trach while on ventilator - scheduled duoneb with prn albuterol - f/u CXR  Squamous cell carcinoma of larynx. Lt lower lung mass. - seen by medical oncology - receiving radiation treatment  Goals of care. - palliative care consulted  Transient A fib with RVR. - monitor on telemetry  Dysphagia. Cachexia. - tube feeds  ETOH, cocaine abuse. - continue thiamine, folic acid  Stage 2 sacral pressure ulcer. - POA - wound care    Best practice:  Diet: tube feeds DVT prophylaxis: SQ heparin GI prophylaxis: protonix Mobility: bed rest Code Status: full code  Disposition: ICU  Labs:   CMP Latest Ref Rng & Units 07/27/2019 07/26/2019 07/25/2019  Glucose 70 - 99 mg/dL 86 81 79  BUN 8 - 23 mg/dL 16 15 13   Creatinine 0.61 - 1.24 mg/dL 0.46(L) 0.40(L) 0.44(L)  Sodium 135 - 145 mmol/L 135 135 134(L)  Potassium 3.5 - 5.1 mmol/L 4.5 4.7 4.5  Chloride 98  - 111 mmol/L 95(L) 95(L) 96(L)  CO2 22 - 32 mmol/L 30 31 30   Calcium 8.9 - 10.3 mg/dL 8.9 8.9 8.7(L)  Total Protein 6.5 - 8.1 g/dL 6.9 6.6 6.6  Total Bilirubin 0.3 - 1.2 mg/dL 0.2(L) 0.4 0.3  Alkaline Phos 38 - 126 U/L 74 73 75  AST 15 - 41 U/L 34 46(H) 40  ALT 0 - 44 U/L 40 49(H) 38    CBC Latest Ref Rng & Units 07/27/2019 07/26/2019 07/25/2019  WBC 4.0 - 10.5 K/uL 5.0 4.2 4.4  Hemoglobin 13.0 - 17.0 g/dL 8.7(L) 8.8(L) 8.4(L)  Hematocrit 39.0 - 52.0 % 27.1(L) 26.8(L) 25.7(L)  Platelets 150 - 400 K/uL 324 323 296    ABG No results found for: PHART, PCO2ART, PO2ART, HCO3, TCO2, ACIDBASEDEF, O2SAT   CBG (last 3)  Recent Labs    07/27/19 0712 07/27/19 1230 07/27/19 1358  GLUCAP 76 100* 91    Critical care time: 39 minutes  Chesley Mires, MD [place] Pager - [phone] 07/27/2019, 2:45 PM

## 2019-07-27 NOTE — Progress Notes (Signed)
PT Cancellation Note  Patient Details Name: Devon Peterson MRN: [id number] DOB: 07-17-1955   Cancelled Treatment:    Reason Eval/Treat Not Completed: Medical issues which prohibited therapy, tranferred to ICU.   Claretha Cooper 07/27/2019, 2:01 PM  [place] Pager [phone] Office [phone]

## 2019-07-27 NOTE — Progress Notes (Signed)
CRITICAL VALUE ALERT  Critical Value:  pO2 32.8  Date & Time Notied:  07/27/19 1734  Provider Notified: Dr. Halford Chessman  Orders Received/Actions taken: suspected to be venous blood, instructed to continue monitoring pulse ox

## 2019-07-27 NOTE — Progress Notes (Signed)
Spoke with pt's sister, [name], over the phone.  Updated her about events of the day, need for mechanical ventilation and need to change to #6 cuffed tracheostomy tube while on mechanical ventilation.  Chesley Mires, MD [place] Pager - [phone] 07/27/2019, 3:36 PM

## 2019-07-27 NOTE — Progress Notes (Signed)
[facility] Dept Therapy Treatment Record Phone [phone]   Radiation Therapy was administered to Devon Peterson on: 07/27/2019  11:36 AM and was treatment # 3 out of a planned course of 20 treatments.  Radiation Treatment  1). Beam photons with 6-10 energy  2). Brachytherapy None  3). Stereotactic Radiosurgery None  4). Other Radiation None     Gamaliel Charney A Benicia Bergevin, RT (T)

## 2019-07-28 ENCOUNTER — Inpatient Hospital Stay (HOSPITAL_COMMUNITY): Payer: Medicaid Other

## 2019-07-28 ENCOUNTER — Ambulatory Visit
Admit: 2019-07-28 | Discharge: 2019-07-28 | Disposition: A | Discharge status: Home / Self Care | Payer: Medicaid Other | Attending: Radiation Oncology | Admitting: Radiation Oncology

## 2019-07-28 LAB — CBC WITH DIFFERENTIAL/PLATELET
Abs Immature Granulocytes: 0.05 10*3/uL (ref 0.00–0.07)
Basophils Absolute: 0 10*3/uL (ref 0.0–0.1)
Basophils Relative: 1 %
Eosinophils Absolute: 0 10*3/uL (ref 0.0–0.5)
Eosinophils Relative: 0 %
HCT: 25.2 % — ABNORMAL LOW (ref 39.0–52.0)
Hemoglobin: 8.3 g/dL — ABNORMAL LOW (ref 13.0–17.0)
Immature Granulocytes: 1 %
Lymphocytes Relative: 19 %
Lymphs Abs: 1.6 10*3/uL (ref 0.7–4.0)
MCH: 27.9 pg (ref 26.0–34.0)
MCHC: 32.9 g/dL (ref 30.0–36.0)
MCV: 84.8 fL (ref 80.0–100.0)
Monocytes Absolute: 0.5 10*3/uL (ref 0.1–1.0)
Monocytes Relative: 6 %
Neutro Abs: 6.1 10*3/uL (ref 1.7–7.7)
Neutrophils Relative %: 73 %
Platelets: 263 10*3/uL (ref 150–400)
RBC: 2.97 MIL/uL — ABNORMAL LOW (ref 4.22–5.81)
RDW: 14.4 % (ref 11.5–15.5)
WBC: 8.2 10*3/uL (ref 4.0–10.5)
nRBC: 0 % (ref 0.0–0.2)

## 2019-07-28 LAB — COMPREHENSIVE METABOLIC PANEL
ALT: 32 U/L (ref 0–44)
AST: 29 U/L (ref 15–41)
Albumin: 2.9 g/dL — ABNORMAL LOW (ref 3.5–5.0)
Alkaline Phosphatase: 72 U/L (ref 38–126)
Anion gap: 12 (ref 5–15)
BUN: 14 mg/dL (ref 8–23)
CO2: 26 mmol/L (ref 22–32)
Calcium: 8.7 mg/dL — ABNORMAL LOW (ref 8.9–10.3)
Chloride: 96 mmol/L — ABNORMAL LOW (ref 98–111)
Creatinine, Ser: 0.44 mg/dL — ABNORMAL LOW (ref 0.61–1.24)
GFR calc Af Amer: >60 mL/min (ref >60)
GFR calc non Af Amer: >60 mL/min (ref >60)
Glucose, Bld: 90 mg/dL (ref 70–99)
Potassium: 4 mmol/L (ref 3.5–5.1)
Sodium: 134 mmol/L — ABNORMAL LOW (ref 135–145)
Total Bilirubin: 0.4 mg/dL (ref 0.3–1.2)
Total Protein: 6.7 g/dL (ref 6.5–8.1)

## 2019-07-28 LAB — GLUCOSE, CAPILLARY
Glucose-Capillary: 72 mg/dL (ref 70–99)
Glucose-Capillary: 187 mg/dL — ABNORMAL HIGH (ref 70–99)
Glucose-Capillary: 86 mg/dL (ref 70–99)
Glucose-Capillary: 122 mg/dL — ABNORMAL HIGH (ref 70–99)
Glucose-Capillary: 112 mg/dL — ABNORMAL HIGH (ref 70–99)

## 2019-07-28 LAB — MAGNESIUM: Magnesium: 1.6 mg/dL — ABNORMAL LOW (ref 1.7–2.4)

## 2019-07-28 LAB — PHOSPHORUS: Phosphorus: 3.5 mg/dL (ref 2.5–4.6)

## 2019-07-28 LAB — TRIGLYCERIDES: Triglycerides: 72 mg/dL (ref <150)

## 2019-07-28 MED ORDER — SONAFINE EX EMUL
1.0000 "application " | Freq: Two times a day (BID) | CUTANEOUS | Status: DC | PRN
  Filled 2019-07-28: qty 1

## 2019-07-28 MED ORDER — PRUTECT EX EMUL
1.0000 "application " | Freq: Two times a day (BID) | CUTANEOUS | Status: DC | PRN
  Filled 2019-07-28: qty 45

## 2019-07-28 MED ORDER — MAGNESIUM SULFATE 2 GM/50ML IV SOLN
2.0000 g | Freq: Once | INTRAVENOUS | Status: AC
  Administered 2019-07-28: 2 g via INTRAVENOUS
  Filled 2019-07-28: qty 50

## 2019-07-28 NOTE — TOC Progression Note (Signed)
Transition of Care [facility]) - Progression Note    Patient Details  Name: Devon Peterson MRN: [id number] Date of Birth: 1955/04/28  Transition of [facility] CM/SW Contact  Leeroy Cha, RN Phone Number: 07/28/2019, 11:33 AM  Clinical Narrative:    Patient is back on the vent. Has a bed at [facility] with trach when stable.   Expected Discharge Plan: Skilled Nursing Facility Barriers to Discharge: Continued Medical Work up  Expected Discharge Plan and Services Expected Discharge Plan: [place] Acute Care Choice: [place]                                         Social Determinants of Health (SDOH) Interventions    Readmission Risk Interventions Readmission Risk Prevention Plan 07/27/2019  Transportation Screening Complete  HRI or [place] Complete  Social Work Consult for [place] Planning/Counseling Complete  Palliative Care Screening Complete  Medication Review [employer]) Complete  Some recent data might be hidden

## 2019-07-28 NOTE — Progress Notes (Signed)
Assisted with transporting PT from ICU to [facility] while on transport vent while on 100% Fi02- uneventful.

## 2019-07-28 NOTE — Progress Notes (Signed)
NAME:  Devon Peterson, MRN:  [id number], DOB:  29-Aug-1955, LOS: 21 ADMISSION DATE:  07/07/2019, CONSULTATION DATE:  07/09/19  REFERRING MD:  Alfredia Ferguson, CHIEF COMPLAINT:  Post-operative hypotension   Brief History   64 yo male smoker admitted 5/04 with aspiration pneumonia and found to have hypopharyngeal mass.  Had direct laryngoscopy with biopsy and tracheostomy on 5/06.  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      Past Medical History  COPD, Cocaine abuse  Significant Hospital Events   5/4 admitted 5/5 seen by ENT as initial evaluation underwent flexible laryngoscopy 5/6 to operating room for direct laryngoscopy and biopsy, and tracheostomy placement pulmonary asked to evaluate postop for hypotension 5/11 G tube placed 5/23 tracheostomy bleeding >> no interventions needs 5/24 transfer to ICU for respiratory distress; placed on ventilator and changed to #4 cuffed trach (unable to pass #6 cuffed trach); ENT changed to #6 cuffed trach  Consults:  ENT Oncology Radiation oncology Palliative care  Procedures:  Tracheostomy 5/06 >> G tube 5/11 >>   Significant Diagnostic Tests:  CT angio chest 5/04 >> atherosclerosis, severe emphysema, b/l lower lobe consolidation Lt > Rt, 2.7 cm mass LLL CT neck 5/05 >> enhancing hypopharyngeal soft tissue in post cricoid region  Micro Data:  SARS CoV2 PCR 5/04 >> negative Blood 5/04 >> negative Sputum 5/24 >>  Antimicrobials:  Unasyn 5/04 >> 5/11  Interim history/subjective:  Remains on vent with increased PEEP and FiO2.  Bloody secretions suctioned from trach.  Objective   Blood pressure 96/70, pulse (!) 106, temperature 98.9 F (37.2 C), temperature source Oral, resp. rate (!) 24, height 5\' 11"  (1.803 m), weight 44 kg, SpO2 98 %.    Vent Mode: PSV FiO2 (%):  [28 %-100 %] 40 % Set Rate:  [16 bmp-28 bmp] 16 bmp Vt Set:  [600 mL] 600 mL PEEP:  [5 cmH20-8 cmH20] 8 cmH20 Pressure Support:  [12 cmH20] 12 cmH20 Plateau  Pressure:  [24 cmH20-29 cmH20] 26 cmH20   Intake/Output Summary (Last 24 hours) at 07/28/2019 0935 Last data filed at 07/28/2019 0600 Gross per 24 hour  Intake 775.16 ml  Output 1350 ml  Net -574.84 ml   Filed Weights   07/07/19 2049 07/09/19 0949 07/20/19 1600  Weight: 42 kg 42 kg 44 kg    Examination:  General - cachectic Eyes - pupils reactive ENT - trach in place Cardiac - regular, tachycardic Chest - b/l crackles Abdomen - soft, non tender, G tube in place Extremities - decreased muscle bulk Skin - no rashes Neuro - follows commands   Resolved problems:  Aspiration pneumonia  Assessment & Plan:   Acute hypoxic respiratory failure. COPD with emphysema. - changed to #6 cuffed trach by ENT on 5/24 - goal SpO2 > 90% - f/u CXR, sputum culture - scheduled duoneb and prn albuterol - defer bronchoscopy, ABx for now  Squamous cell carcinoma of larynx. Lt lower lung mass. - seen by medical oncology - receiving radiation treatment >> okay to proceed with treatment on 5/25  Goals of care. - palliative care consulted  Transient A fib with RVR in setting of respiratory distress/hypoxia. - back in sinus rhythm 5/25 - monitor on telemetry - no anticoagulation in setting of hemoptysis  Dysphagia. Cachexia. - tube feeds  ETOH, cocaine abuse. - continue thiamine, folic acid  Stage 2 sacral pressure ulcer. - POA - wound care   Best practice:  Diet: tube feeds DVT prophylaxis: SQ heparin GI prophylaxis: protonix Mobility: bed rest  Code Status: full code  Disposition: ICU  Labs:   CMP Latest Ref Rng & Units 07/28/2019 07/27/2019 07/26/2019  Glucose 70 - 99 mg/dL 90 86 81  BUN 8 - 23 mg/dL 14 16 15   Creatinine 0.61 - 1.24 mg/dL 0.44(L) 0.46(L) 0.40(L)  Sodium 135 - 145 mmol/L 134(L) 135 135  Potassium 3.5 - 5.1 mmol/L 4.0 4.5 4.7  Chloride 98 - 111 mmol/L 96(L) 95(L) 95(L)  CO2 22 - 32 mmol/L 26 30 31   Calcium 8.9 - 10.3 mg/dL 8.7(L) 8.9 8.9  Total Protein  6.5 - 8.1 g/dL 6.7 6.9 6.6  Total Bilirubin 0.3 - 1.2 mg/dL 0.4 0.2(L) 0.4  Alkaline Phos 38 - 126 U/L 72 74 73  AST 15 - 41 U/L 29 34 46(H)  ALT 0 - 44 U/L 32 40 49(H)    CBC Latest Ref Rng & Units 07/28/2019 07/27/2019 07/26/2019  WBC 4.0 - 10.5 K/uL 8.2 5.0 4.2  Hemoglobin 13.0 - 17.0 g/dL 8.3(L) 8.7(L) 8.8(L)  Hematocrit 39.0 - 52.0 % 25.2(L) 27.1(L) 26.8(L)  Platelets 150 - 400 K/uL 263 324 323    ABG    Component Value Date/Time   PHART 7.299 (L) 07/27/2019 1655   PCO2ART 60.2 (H) 07/27/2019 1655   PO2ART 32.8 (LL) 07/27/2019 1655   HCO3 28.6 (H) 07/27/2019 1655   O2SAT 44.3 07/27/2019 1655     CBG (last 3)  Recent Labs    07/27/19 1955 07/27/19 2337 07/28/19 0836  GLUCAP 129* 173* 72    Critical care time: 32 minutes  Chesley Mires, MD [place] Pager - [phone] 07/28/2019, 9:35 AM

## 2019-07-29 ENCOUNTER — Inpatient Hospital Stay (HOSPITAL_COMMUNITY): Payer: Medicaid Other

## 2019-07-29 ENCOUNTER — Ambulatory Visit: Payer: Medicaid Other

## 2019-07-29 DIAGNOSIS — J9621 Acute and chronic respiratory failure with hypoxia: Secondary | ICD-10-CM

## 2019-07-29 LAB — BASIC METABOLIC PANEL
Anion gap: 11 (ref 5–15)
BUN: 14 mg/dL (ref 8–23)
CO2: 24 mmol/L (ref 22–32)
Calcium: 8.9 mg/dL (ref 8.9–10.3)
Chloride: 98 mmol/L (ref 98–111)
Creatinine, Ser: 0.43 mg/dL — ABNORMAL LOW (ref 0.61–1.24)
GFR calc Af Amer: >60 mL/min (ref >60)
GFR calc non Af Amer: >60 mL/min (ref >60)
Glucose, Bld: 92 mg/dL (ref 70–99)
Potassium: 3.7 mmol/L (ref 3.5–5.1)
Sodium: 133 mmol/L — ABNORMAL LOW (ref 135–145)

## 2019-07-29 LAB — CBC
HCT: 23.9 % — ABNORMAL LOW (ref 39.0–52.0)
Hemoglobin: 8.1 g/dL — ABNORMAL LOW (ref 13.0–17.0)
MCH: 28.7 pg (ref 26.0–34.0)
MCHC: 33.9 g/dL (ref 30.0–36.0)
MCV: 84.8 fL (ref 80.0–100.0)
Platelets: 253 10*3/uL (ref 150–400)
RBC: 2.82 MIL/uL — ABNORMAL LOW (ref 4.22–5.81)
RDW: 14.4 % (ref 11.5–15.5)
WBC: 4.5 10*3/uL (ref 4.0–10.5)
nRBC: 0 % (ref 0.0–0.2)

## 2019-07-29 LAB — GLUCOSE, CAPILLARY
Glucose-Capillary: 100 mg/dL — ABNORMAL HIGH (ref 70–99)
Glucose-Capillary: 95 mg/dL (ref 70–99)
Glucose-Capillary: 83 mg/dL (ref 70–99)
Glucose-Capillary: 97 mg/dL (ref 70–99)

## 2019-07-29 LAB — HEMOGLOBIN AND HEMATOCRIT, BLOOD
HCT: 27 % — ABNORMAL LOW (ref 39.0–52.0)
Hemoglobin: 9.1 g/dL — ABNORMAL LOW (ref 13.0–17.0)

## 2019-07-29 MED ORDER — ALBUMIN HUMAN 5 % IV SOLN
25.0000 g | Freq: Once | INTRAVENOUS | Status: AC
  Administered 2019-07-29: 25 g via INTRAVENOUS
  Filled 2019-07-29: qty 500

## 2019-07-29 MED ORDER — LACTATED RINGERS IV BOLUS
500.0000 mL | Freq: Once | INTRAVENOUS | Status: AC
  Administered 2019-07-30: 500 mL via INTRAVENOUS

## 2019-07-29 MED ORDER — LACTATED RINGERS IV BOLUS
500.0000 mL | Freq: Once | INTRAVENOUS | Status: AC
  Administered 2019-07-29: 500 mL via INTRAVENOUS

## 2019-07-29 MED ORDER — VITAMIN C 500 MG/5ML PO SYRP
100.0000 mg | ORAL_SOLUTION | Freq: Two times a day (BID) | ORAL | Status: DC
  Administered 2019-07-29 – 2019-08-28 (×59): 100 mg
  Filled 2019-07-29 (×62): qty 1

## 2019-07-29 MED ORDER — FREE WATER
200.0000 mL | Status: DC
  Administered 2019-07-29 – 2019-07-31 (×10): 200 mL

## 2019-07-29 MED ORDER — LACTATED RINGERS IV BOLUS
1000.0000 mL | Freq: Once | INTRAVENOUS | Status: AC
  Administered 2019-07-29: 1000 mL via INTRAVENOUS

## 2019-07-29 MED ORDER — FERROUS SULFATE 300 (60 FE) MG/5ML PO SYRP
300.0000 mg | ORAL_SOLUTION | Freq: Two times a day (BID) | ORAL | Status: DC
  Administered 2019-07-29 – 2019-08-28 (×58): 300 mg
  Filled 2019-07-29 (×61): qty 5

## 2019-07-29 NOTE — Progress Notes (Signed)
NAME:  Garnie Borchardt, MRN:  [id number], DOB:  [birth year], LOS: 73 ADMISSION DATE:  07/07/2019, CONSULTATION DATE:  07/09/19  REFERRING MD:  Alfredia Ferguson, CHIEF COMPLAINT:  Post-operative hypotension   Brief History   64 yo male smoker admitted 5/04 with aspiration pneumonia and found to have hypopharyngeal mass.  Had direct laryngoscopy with biopsy and tracheostomy on 5/06.  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      Past Medical History  COPD, Cocaine abuse  Significant Hospital Events   5/4 admitted 5/5 seen by ENT as initial evaluation underwent flexible laryngoscopy 5/6 to operating room for direct laryngoscopy and biopsy, and tracheostomy placement pulmonary asked to evaluate postop for hypotension 5/11 G tube placed 5/23 tracheostomy bleeding >> no interventions needs 5/24 transfer to ICU for respiratory distress; placed on ventilator and changed to #4 cuffed trach (unable to pass #6 cuffed trach); ENT changed to #6 cuffed trach  Consults:  ENT Oncology Radiation oncology Palliative care  Procedures:  Tracheostomy 5/06 >> G tube 5/11 >>   Significant Diagnostic Tests:  CT angio chest 5/04 >> atherosclerosis, severe emphysema, b/l lower lobe consolidation Lt > Rt, 2.7 cm mass LLL CT neck 5/05 >> enhancing hypopharyngeal soft tissue in post cricoid region  Micro Data:  SARS CoV2 PCR 5/04 >> negative Blood 5/04 >> negative Sputum 5/24 >>  Antimicrobials:  Unasyn 5/04 >> 5/11  Interim history/subjective:  Had XRT yesterday.  Tolerating pressure support.  HR up, and BP soft.  Objective   Blood pressure 91/71, pulse (!) 127, temperature (!) 96.4 F (35.8 C), temperature source Oral, resp. rate (!) 23, height 5\' 11"  (1.803 m), weight 44 kg, SpO2 100 %.    Vent Mode: PSV;CPAP FiO2 (%):  [40 %-50 %] 40 % Set Rate:  [16 bmp] 16 bmp Vt Set:  [600 mL] 600 mL PEEP:  [5 cmH20-8 cmH20] 5 cmH20 Pressure Support:  [10 cmH20] 10 cmH20 Plateau Pressure:  [17  cmH20-27 cmH20] 24 cmH20   Intake/Output Summary (Last 24 hours) at 07/29/2019 6734 Last data filed at 07/29/2019 0600 Gross per 24 hour  Intake 645.82 ml  Output 1250 ml  Net -604.18 ml   Filed Weights   07/07/19 2049 07/09/19 0949 07/20/19 1600  Weight: 42 kg 42 kg 44 kg    Examination:  General - cachectic Eyes - pupils reactive ENT - trach site clean Cardiac - regular, tachycardic Chest - decreased BS, no wheeze Abdomen - soft, non tender, G tube in place Extremities - decreased muscle bulk Skin - no rashes Neuro - follows commands   Resolved problems:  Aspiration pneumonia, Transient A fib with RVR new during this admission  Assessment & Plan:   Acute hypoxic respiratory failure. COPD with emphysema. - changed to #6 cuffed trach by ENT on 5/24 - goal SpO2 > 90% - f/u CXR intermittently - scheduled duoneb with prn albuterol - pressure support wean to TC as tolerated  Squamous cell carcinoma of larynx. Lt lower lung mass. - seen by medical oncology - receiving radiation therapy to laryngeal mass  Goals of care. - palliative care consulted  Sinus tachycardia, hypotension. - likely from hypovolemia - give LR 1 liter bolus 5/26 - monitor on telemetry  Dysphagia. Cachexia. - tube feeds  Anemia of critical illness, iron deficiency, and chronic disease. - f/u CBC - add feosol and vitamin C - transfuse for Hb < 7 or significant bleeding  ETOH, cocaine abuse. - continue thiamine, folic acid  Stage 2 sacral  pressure ulcer. - POA - wound care   Best practice:  Diet: tube feeds DVT prophylaxis: SQ heparin GI prophylaxis: protonix Mobility: bed rest Code Status: full code  Disposition: ICU  Labs:   CMP Latest Ref Rng & Units 07/29/2019 07/28/2019 07/27/2019  Glucose 70 - 99 mg/dL 92 90 86  BUN 8 - 23 mg/dL 14 14 16   Creatinine 0.61 - 1.24 mg/dL 0.43(L) 0.44(L) 0.46(L)  Sodium 135 - 145 mmol/L 133(L) 134(L) 135  Potassium 3.5 - 5.1 mmol/L 3.7 4.0  4.5  Chloride 98 - 111 mmol/L 98 96(L) 95(L)  CO2 22 - 32 mmol/L 24 26 30   Calcium 8.9 - 10.3 mg/dL 8.9 8.7(L) 8.9  Total Protein 6.5 - 8.1 g/dL - 6.7 6.9  Total Bilirubin 0.3 - 1.2 mg/dL - 0.4 0.2(L)  Alkaline Phos 38 - 126 U/L - 72 74  AST 15 - 41 U/L - 29 34  ALT 0 - 44 U/L - 32 40    CBC Latest Ref Rng & Units 07/29/2019 07/28/2019 07/27/2019  WBC 4.0 - 10.5 K/uL 4.5 8.2 5.0  Hemoglobin 13.0 - 17.0 g/dL 8.1(L) 8.3(L) 8.7(L)  Hematocrit 39.0 - 52.0 % 23.9(L) 25.2(L) 27.1(L)  Platelets 150 - 400 K/uL 253 263 324    ABG    Component Value Date/Time   PHART 7.299 (L) 07/27/2019 1655   PCO2ART 60.2 (H) 07/27/2019 1655   PO2ART 32.8 (LL) 07/27/2019 1655   HCO3 28.6 (H) 07/27/2019 1655   O2SAT 44.3 07/27/2019 1655     CBG (last 3)  Recent Labs    07/28/19 2324 07/29/19 0354 07/29/19 0843  GLUCAP 112* 100* 95    Signature:  Chesley Mires, MD [place] Pager - [phone] 07/29/2019, 9:24 AM

## 2019-07-29 NOTE — Progress Notes (Addendum)
eLink Physician-Brief Progress Note Patient Name: Devon Peterson DOB: 1955/10/08 MRN: [id number]   Date of Service  07/29/2019  HPI/Events of Note  Sinus tachycardia of unclear etiology, Hemoglobin done earlier this evening is stable at 8.1 gm %, no evidence of overt heart failure, he does not have a baseline ECHO in the system.  eICU Interventions  LR 500 ml iv bolus x 1, Albumin 5 % 500 ml iv bolus x 1, stat H & H.        Kerry Kass Demetri Goshert 07/29/2019, 10:56 PM

## 2019-07-29 NOTE — Progress Notes (Signed)
PMD progress note. Devon Peterson is resting in bed.  At times he is seen attempting to    write some things on clipboard.  He appears weak and deconditioned.  BP 107/82   Pulse (!) 135   Temp (!) 96.4 F (35.8 C) (Oral)   Resp (!) 25   Ht 5\' 11"  (1.803 m)   Wt 44 kg   SpO2 100%   BMI 13.53 kg/m   Labs and imaging reviewed.  Chart reviewed.  Briefly discussed with PCCM colleague Devon Rama, NP.  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  Underlying history of COPD and cocaine use.  Also found to have left lower lung mass.  Hospital course complicated by acute hypoxic respiratory failure hypotension and ventilator dependence.  Palliative care has been following for supportive presents ongoing.  Received call from patient's sister units requesting for completion of healthcare power of attorney paperwork.  [name] was established as his healthcare power of attorney agent after a family meeting to discuss broad goals of care earlier in this hospitalization.  Have requested chaplain assistance for finalization of this paperwork.  Continue current mode of care.  Palliative team to follow peripherally.  Suspect patient will go to [facility] when deemed medically stable.  Recommend ongoing palliative support in the outpatient setting as well.  Greater than 50% of this time was spent counseling and coordinating care related to the above assessment and plan.  Loistine Chance MD 25 minutes spent. [place] palliative medicine team [phone]

## 2019-07-29 NOTE — Progress Notes (Signed)
Pt. placed back to full support at this time, was tolerating c/ps well with no >'d WOB, HR elevated to 153, RN aware @ bedside.

## 2019-07-29 NOTE — Progress Notes (Signed)
Chaplain assisted Mr Earhart in completing HCPOA and Living Will.  Mr Diehl demonstrated A&O x4.  Documents notarized with witnesses.    Original and 2 copies with patient.  Copy placed in pt's chart on unit.

## 2019-07-30 ENCOUNTER — Ambulatory Visit
Admit: 2019-07-30 | Discharge: 2019-07-30 | Disposition: A | Discharge status: Home / Self Care | Payer: Medicaid Other | Attending: Radiation Oncology | Admitting: Radiation Oncology

## 2019-07-30 LAB — CBC
HCT: 24.2 % — ABNORMAL LOW (ref 39.0–52.0)
Hemoglobin: 8 g/dL — ABNORMAL LOW (ref 13.0–17.0)
MCH: 28.4 pg (ref 26.0–34.0)
MCHC: 33.1 g/dL (ref 30.0–36.0)
MCV: 85.8 fL (ref 80.0–100.0)
Platelets: 249 10*3/uL (ref 150–400)
RBC: 2.82 MIL/uL — ABNORMAL LOW (ref 4.22–5.81)
RDW: 14.6 % (ref 11.5–15.5)
WBC: 4.6 10*3/uL (ref 4.0–10.5)
nRBC: 0 % (ref 0.0–0.2)

## 2019-07-30 LAB — GLUCOSE, CAPILLARY
Glucose-Capillary: 105 mg/dL — ABNORMAL HIGH (ref 70–99)
Glucose-Capillary: 77 mg/dL (ref 70–99)
Glucose-Capillary: 96 mg/dL (ref 70–99)
Glucose-Capillary: 98 mg/dL (ref 70–99)
Glucose-Capillary: 106 mg/dL — ABNORMAL HIGH (ref 70–99)

## 2019-07-30 LAB — BASIC METABOLIC PANEL
Anion gap: 10 (ref 5–15)
BUN: 14 mg/dL (ref 8–23)
CO2: 24 mmol/L (ref 22–32)
Calcium: 8.6 mg/dL — ABNORMAL LOW (ref 8.9–10.3)
Chloride: 98 mmol/L (ref 98–111)
Creatinine, Ser: 0.44 mg/dL — ABNORMAL LOW (ref 0.61–1.24)
GFR calc Af Amer: >60 mL/min (ref >60)
GFR calc non Af Amer: >60 mL/min (ref >60)
Glucose, Bld: 114 mg/dL — ABNORMAL HIGH (ref 70–99)
Potassium: 4.1 mmol/L (ref 3.5–5.1)
Sodium: 132 mmol/L — ABNORMAL LOW (ref 135–145)

## 2019-07-30 LAB — CULTURE, RESPIRATORY W GRAM STAIN: Culture: NORMAL

## 2019-07-30 MED ORDER — PRO-STAT SUGAR FREE PO LIQD
30.0000 mL | Freq: Three times a day (TID) | ORAL | Status: DC
  Administered 2019-07-30 – 2019-08-04 (×16): 30 mL
  Filled 2019-07-30 (×16): qty 30

## 2019-07-30 MED ORDER — OSMOLITE 1.5 CAL PO LIQD
237.0000 mL | Freq: Three times a day (TID) | ORAL | Status: DC
  Administered 2019-07-30 – 2019-08-04 (×15): 237 mL
  Filled 2019-07-30 (×17): qty 237

## 2019-07-30 MED ORDER — LEVALBUTEROL HCL 0.63 MG/3ML IN NEBU: 0.6300 mg | INHALATION_SOLUTION | Freq: Four times a day (QID) | RESPIRATORY_TRACT | Status: DC

## 2019-07-30 MED ORDER — AMIODARONE IV BOLUS ONLY 150 MG/100ML
150.0000 mg | Freq: Once | INTRAVENOUS | Status: AC
  Administered 2019-07-30: 150 mg via INTRAVENOUS
  Filled 2019-07-30: qty 100

## 2019-07-30 MED ORDER — LEVALBUTEROL HCL 0.63 MG/3ML IN NEBU: 0.6300 mg | INHALATION_SOLUTION | RESPIRATORY_TRACT | Status: DC | PRN

## 2019-07-30 MED ORDER — IPRATROPIUM BROMIDE 0.02 % IN SOLN
0.5000 mg | Freq: Three times a day (TID) | RESPIRATORY_TRACT | Status: DC
  Administered 2019-07-30 – 2019-08-10 (×33): 0.5 mg via RESPIRATORY_TRACT
  Filled 2019-07-30 (×33): qty 2.5

## 2019-07-30 MED ORDER — IPRATROPIUM BROMIDE 0.02 % IN SOLN: 0.5000 mg | Freq: Four times a day (QID) | RESPIRATORY_TRACT | Status: DC

## 2019-07-30 MED ORDER — LEVALBUTEROL HCL 0.63 MG/3ML IN NEBU
0.6300 mg | INHALATION_SOLUTION | Freq: Three times a day (TID) | RESPIRATORY_TRACT | Status: DC
  Administered 2019-07-30 – 2019-08-10 (×33): 0.63 mg via RESPIRATORY_TRACT
  Filled 2019-07-30 (×33): qty 3

## 2019-07-30 MED ORDER — METOPROLOL TARTRATE 5 MG/5ML IV SOLN
5.0000 mg | Freq: Four times a day (QID) | INTRAVENOUS | Status: DC | PRN
  Filled 2019-07-30 (×2): qty 5

## 2019-07-30 MED ORDER — SERTRALINE HCL 20 MG/ML PO CONC
50.0000 mg | Freq: Every day | ORAL | Status: DC
  Administered 2019-07-30 – 2019-08-28 (×29): 50 mg
  Filled 2019-07-30 (×31): qty 2.5

## 2019-07-30 NOTE — Progress Notes (Signed)
PT Cancellation Note  Patient Details Name: Devon Peterson MRN: [id number] DOB: Jun 14, 1955   Cancelled Treatment:    Reason Eval/Treat Not Completed: Patient declined,patient communicated vie writing"I have  Other things going on> Radiation at 1:00" will follow as patient able. HR noted 110-137,   Claretha Cooper 07/30/2019, 11:18 AM [place] Pager [phone] Office [phone]

## 2019-07-30 NOTE — Progress Notes (Signed)
eLink Physician-Brief Progress Note Patient Name: Devon Peterson DOB: Apr 14, 1955 MRN: [id number]   Date of Service  07/30/2019  HPI/Events of Note  Persistent tachycardia despite fluid bolus , Atrial flutter with 2:1 block?  eICU Interventions  Will give  150 mg iv bolus of Amiodarone in hopes of slowing the rate and unmasking Atrial flutter.        Kerry Kass Tarea Skillman 07/30/2019, 12:24 AM

## 2019-07-30 NOTE — Progress Notes (Signed)
Patient noticed to have HR of 140s-160s sustaining. E-link was notified. 1L LR bolus was given. Albumin was ordered but stopped due to patient being "itchy" with no hives once med was hung. Albumin stopped and bolus continue. EKG showed the heart rhythm to be atrial flutter. Amiodarone bolus is being given.

## 2019-07-30 NOTE — Progress Notes (Signed)
PMD progress note. [name] is resting in bed.  He writes "needs cleaning" on the white board.  He points to his trach area.  He thinks that he has some secretions stuck in there.  Overall, he states he is feeling okay.     BP (!) 94/52   Pulse (!) 129   Temp 98 F (36.7 C) (Oral)   Resp 20   Ht 5\' 11"  (1.803 m)   Wt 44 kg   SpO2 100%   BMI 13.53 kg/m   Labs and imaging reviewed.  Chart reviewed.  Briefly discussed with PCCM colleague Devon Rama, NP.  Appears frail and weak Appears cachectic Has tracheostomy G-tube in place Muscle wasting evident Follows some commands  Palliative performance scale 30%  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  Underlying history of COPD and cocaine use.  Also found to have left lower lung mass.  Hospital course complicated by acute hypoxic respiratory failure hypotension and ventilator dependence.  Palliative care has been following for supportive presence ongoing.  Appreciate chaplain follow-up and completion of advance directives, healthcare power of attorney paperwork establishing [name] as his chosen healthcare power of attorney agent.  Continue current mode of care.  Palliative team to follow peripherally.  Suspect patient will go to [facility] when deemed medically stable.  Recommend ongoing palliative support in the outpatient setting as well.  Greater than 50% of this time was spent counseling and coordinating care related to the above assessment and plan.  Devon Chance MD 15 minutes spent. Devon Peterson palliative medicine team [phone]

## 2019-07-30 NOTE — Progress Notes (Signed)
OT Cancellation Note  Patient Details Name: Devon Peterson MRN: [id number] DOB: 09-29-1955   Cancelled Treatment:    Reason Eval/Treat Not Completed: Patient declined, no reason specified. Patient stating he has radiation and 1300, try to encourage to work with therapy as it is only 1030 but patient shaking head no. Will re-attempt 5/28 as schedule permits.  Delbert Phenix OT Pager: [place] 07/30/2019, 11:19 AM

## 2019-07-30 NOTE — TOC Progression Note (Signed)
Transition of Care [facility]) - Progression Note    Patient Details  Name: Devon Peterson MRN: [id number] Date of Birth: [birth year]  Transition of [facility]) CM/SW Contact  Leeroy Cha, RN Phone Number: 07/30/2019, 8:33 AM  Clinical Narrative:    Remains on the vent. Awake g-tube in place.  May need ltach placment instead of snf, New HCPOA is the sister [name]. Has trach.   Expected Discharge Plan: Skilled Nursing Facility Barriers to Discharge: Continued Medical Work up  Expected Discharge Plan and Services Expected Discharge Plan: [place] Acute Care Choice: [place]                                         Social Determinants of Health (SDOH) Interventions    Readmission Risk Interventions Readmission Risk Prevention Plan 07/27/2019  Transportation Screening Complete  HRI or [place] Complete  Social Work Consult for [place] Planning/Counseling Complete  Palliative Care Screening Complete  Medication Review Press photographer) Complete  Some recent data might be hidden

## 2019-07-30 NOTE — Progress Notes (Signed)
Nutrition Follow-up  RD working remotely.   DOCUMENTATION CODES:   Severe malnutrition in context of chronic illness, Underweight  INTERVENTION:  - will adjust TF regimen to better meet needs while patient is on the vent.  - 1 carton (237 ml) Osmolite 1.5 TID with 30 ml prostat TID and 200 ml free water TID. - this regimen will provide 1365 kcal (97% estimated kcal need), 90 grams protein, and 1143 ml free water. - weigh patient today.   NUTRITION DIAGNOSIS:   Severe Malnutrition related to chronic illness(COPD) as evidenced by severe fat depletion, severe muscle depletion. -ongoing  GOAL:   Patient will meet greater than or equal to 90% of their needs -met with TF regimen  MONITOR:   TF tolerance, Diet advancement, Labs, Weight trends, Skin  REASON FOR ASSESSMENT:   Ventilator  ASSESSMENT:   64 year old male with past medical history of COPD, polysubstance abuse, recent admission 2/12-2/14 for sepsis secondary to pneumonia and COPD exacerbation presented with SOB, fatigue, and unintentional wt loss. Family reports patient has done poorly s/p returning home from hospital, has difficulty swallowing food, spitting up saliva, and has chronic cough. CXR revealed new left lower lobe airspace opacity concerning for pneumonia, CT angiogram showed abnormal soft tissue in hypopharynx suspicious for underlying mass, and dense bilateral lower lobe consolidation.  Significant Events: 5/4- admission 5/5- initial RD assessment 5/6- trach in OR 5/11- PEG placed in IR 5/12- TF initiation 5/20- first XRT treatment 5/21- changed from continuous to bolus TF 5/24- Rapid Response for tachypnea and labored respirations; intubated via trach   Patient remains intubated via trach with PEG in place. He is receiving 1 carton (237 ml) Osmolite 1.5 x5/day with 30 ml prostat once/day and 120 ml free water/bolus. This regimen is providing 1875 kcal, 89 grams protein, and 1505 ml free water.   He has  not been weighed since 5/17.   Palliative Care is following with notes indicating palliative performance scale score of 30%.   Patient is currently intubated on ventilator support MV: 8.7 L/min Temp (24hrs), Avg:97.9 F (36.6 C), Min:96.4 F (35.8 C), Max:98.3 F (36.8 C) Propofol: none  Labs reviewed; CBGs: 105 and 77 mg/dl, Na: 132 mmol/l, creatinine: 0.44 mg/dl, Ca: 8.6 mg/dl. Medications reviewed; 25 g albumin x1 dose 5/26, 100 mg ascorbic acid per PEG BID, 100 mg colace per PEG BID, 300 mg ferrous sulfate per PEG BID, 17 g miralax/day, 1 tablet senokot BID, 100 mg thiamine per PEG/day.    Diet Order:   Diet Order            Diet NPO time specified  Diet effective now              EDUCATION NEEDS:   No education needs have been identified at this time  Skin:  Skin Assessment: Skin Integrity Issues: Skin Integrity Issues:: Stage II, Incisions Stage II: mid sacrum Incisions: neck for trach (5/6)  Last BM:  5/26  Height:   Ht Readings from Last 1 Encounters:  07/27/19 _0  (1.803 m)    Weight:   Wt Readings from Last 1 Encounters:  07/20/19 44 kg     Estimated Nutritional Needs:  Kcal:  1410 kcal Protein:  80-100 Fluid:  >/= 1.7 L/day    Devon Matin, MS, RD, LDN, CNSC Inpatient Clinical Dietitian RD pager # available in [place]  After hours/weekend pager # available in [facility]

## 2019-07-30 NOTE — Progress Notes (Signed)
NAME:  Devon Peterson, MRN:  [id number], DOB:  04/18/1955, LOS: 23 ADMISSION DATE:  07/07/2019, CONSULTATION DATE:  07/09/19  REFERRING MD:  Alfredia Ferguson, CHIEF COMPLAINT:  Post-operative hypotension   Brief History   64 yo male smoker admitted 5/04 with aspiration pneumonia and found to have hypopharyngeal mass.  Had direct laryngoscopy with biopsy and tracheostomy on 5/06.  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      Past Medical History  COPD, Cocaine abuse  Significant Hospital Events   5/4 admitted 5/5 seen by ENT as initial evaluation underwent flexible laryngoscopy 5/6 to operating room for direct laryngoscopy and biopsy, and tracheostomy placement pulmonary asked to evaluate postop for hypotension 5/11 G tube placed 5/23 tracheostomy bleeding >> no interventions needs 5/24 transfer to ICU for respiratory distress; placed on ventilator and changed to #4 cuffed trach (unable to pass #6 cuffed trach); ENT changed to #6 cuffed trach 5/27 episode of a flutter >> started amiodarone  Consults:  ENT Oncology Radiation oncology Palliative care  Procedures:  Tracheostomy 5/06 >> G tube 5/11 >>   Significant Diagnostic Tests:  CT angio chest 5/04 >> atherosclerosis, severe emphysema, b/l lower lobe consolidation Lt > Rt, 2.7 cm mass LLL CT neck 5/05 >> enhancing hypopharyngeal soft tissue in post cricoid region  Micro Data:  SARS CoV2 PCR 5/04 >> negative Blood 5/04 >> negative Sputum 5/24 >>  Antimicrobials:  Unasyn 5/04 >> 5/11  Interim history/subjective:  Had episode of chest pain last night.  Better this morning.  Started on amiodarone for a flutter.  Objective   Blood pressure (!) 94/52, pulse (!) 129, temperature 98.2 F (36.8 C), temperature source Axillary, resp. rate 20, height 5\' 11"  (1.803 m), weight 44 kg, SpO2 100 %.    Vent Mode: PSV;CPAP FiO2 (%):  [30 %-40 %] 30 % Set Rate:  [16 bmp-22 bmp] 16 bmp Vt Set:  [600 mL] 600 mL PEEP:  [5 cmH20-8  cmH20] 5 cmH20 Pressure Support:  [10 cmH20] 10 cmH20 Plateau Pressure:  [19 cmH20-24 cmH20] 20 cmH20   Intake/Output Summary (Last 24 hours) at 07/30/2019 0846 Last data filed at 07/30/2019 0600 Gross per 24 hour  Intake 3489.05 ml  Output 875 ml  Net 2614.05 ml   Filed Weights   07/07/19 2049 07/09/19 0949 07/20/19 1600  Weight: 42 kg 42 kg 44 kg    Examination:  General - alert Eyes - pupils reactive ENT - trach site clean Cardiac - regular rate/rhythm, no murmur Chest - decreased BS, no wheeze Abdomen - soft, non tender, G tube in place Extremities - decreased muscle bulk, no edema Skin - no rashes Neuro - normal strength, moves extremities, follows commands Psych - appears depressed   Resolved problems:  Aspiration pneumonia  Assessment & Plan:   Acute hypoxic respiratory failure. COPD with emphysema. - changed to #6 cuffed trach by ENT on 5/24 - goal SpO2 > 90% - f/u CXR - atrovent/xopenex tid and xopenex q4h prn - wean to TC as able  Squamous cell carcinoma of larynx. Lt lower lung mass. - seen by medical oncology - receiving radiation therapy to laryngeal mass  Goals of care. - palliative care consulted  Transient A fib/flutter new this admission. - back in sinus rhythm - monitor on telemetry  Hypotension from hypovolemia. - continue IV fluids  Dysphagia. Cachexia. - tube feeds  Anemia of critical illness, iron deficiency, and chronic disease. - f/u CBC, - continue feosol, vit C - transfuse for Hb <  7 or significant bleeding  ETOH, cocaine abuse. - continue thiamine, folic acid  Stage 2 sacral pressure ulcer. - POA - wound care  Depression. - add zoloft 5/27   Best practice:  Diet: tube feeds DVT prophylaxis: SQ heparin GI prophylaxis: protonix Mobility: bed rest Code Status: full code  Disposition: ICU  Labs:   CMP Latest Ref Rng & Units 07/30/2019 07/29/2019 07/28/2019  Glucose 70 - 99 mg/dL 114(H) 92 90  BUN 8 - 23 mg/dL  14 14 14   Creatinine 0.61 - 1.24 mg/dL 0.44(L) 0.43(L) 0.44(L)  Sodium 135 - 145 mmol/L 132(L) 133(L) 134(L)  Potassium 3.5 - 5.1 mmol/L 4.1 3.7 4.0  Chloride 98 - 111 mmol/L 98 98 96(L)  CO2 22 - 32 mmol/L 24 24 26   Calcium 8.9 - 10.3 mg/dL 8.6(L) 8.9 8.7(L)  Total Protein 6.5 - 8.1 g/dL - - 6.7  Total Bilirubin 0.3 - 1.2 mg/dL - - 0.4  Alkaline Phos 38 - 126 U/L - - 72  AST 15 - 41 U/L - - 29  ALT 0 - 44 U/L - - 32    CBC Latest Ref Rng & Units 07/30/2019 07/29/2019 07/29/2019  WBC 4.0 - 10.5 K/uL 4.6 - 4.5  Hemoglobin 13.0 - 17.0 g/dL 8.0(L) 9.1(L) 8.1(L)  Hematocrit 39.0 - 52.0 % 24.2(L) 27.0(L) 23.9(L)  Platelets 150 - 400 K/uL 249 - 253    ABG    Component Value Date/Time   PHART 7.299 (L) 07/27/2019 1655   PCO2ART 60.2 (H) 07/27/2019 1655   PO2ART 32.8 (LL) 07/27/2019 1655   HCO3 28.6 (H) 07/27/2019 1655   O2SAT 44.3 07/27/2019 1655     CBG (last 3)  Recent Labs    07/29/19 1601 07/30/19 0327 07/30/19 0800  GLUCAP 97 105* 77    Signature:  Chesley Mires, MD [place] Pager - [phone] 07/30/2019, 8:46 AM

## 2019-07-31 ENCOUNTER — Ambulatory Visit
Admit: 2019-07-31 | Discharge: 2019-07-31 | Disposition: A | Discharge status: Home / Self Care | Payer: Medicaid Other | Attending: Radiation Oncology | Admitting: Radiation Oncology

## 2019-07-31 ENCOUNTER — Inpatient Hospital Stay (HOSPITAL_COMMUNITY): Payer: Medicaid Other

## 2019-07-31 LAB — GLUCOSE, CAPILLARY
Glucose-Capillary: 89 mg/dL (ref 70–99)
Glucose-Capillary: 79 mg/dL (ref 70–99)
Glucose-Capillary: 128 mg/dL — ABNORMAL HIGH (ref 70–99)
Glucose-Capillary: 82 mg/dL (ref 70–99)
Glucose-Capillary: 74 mg/dL (ref 70–99)

## 2019-07-31 LAB — BASIC METABOLIC PANEL
Anion gap: 11 (ref 5–15)
BUN: 11 mg/dL (ref 8–23)
CO2: 21 mmol/L — ABNORMAL LOW (ref 22–32)
Calcium: 8.6 mg/dL — ABNORMAL LOW (ref 8.9–10.3)
Chloride: 100 mmol/L (ref 98–111)
Creatinine, Ser: 0.43 mg/dL — ABNORMAL LOW (ref 0.61–1.24)
GFR calc Af Amer: >60 mL/min (ref >60)
GFR calc non Af Amer: >60 mL/min (ref >60)
Glucose, Bld: 76 mg/dL (ref 70–99)
Potassium: 3.5 mmol/L (ref 3.5–5.1)
Sodium: 132 mmol/L — ABNORMAL LOW (ref 135–145)

## 2019-07-31 LAB — CBC
HCT: 23.9 % — ABNORMAL LOW (ref 39.0–52.0)
Hemoglobin: 8.1 g/dL — ABNORMAL LOW (ref 13.0–17.0)
MCH: 28.4 pg (ref 26.0–34.0)
MCHC: 33.9 g/dL (ref 30.0–36.0)
MCV: 83.9 fL (ref 80.0–100.0)
Platelets: 303 10*3/uL (ref 150–400)
RBC: 2.85 MIL/uL — ABNORMAL LOW (ref 4.22–5.81)
RDW: 14.9 % (ref 11.5–15.5)
WBC: 2.9 10*3/uL — ABNORMAL LOW (ref 4.0–10.5)
nRBC: 0 % (ref 0.0–0.2)

## 2019-07-31 MED ORDER — FREE WATER
100.0000 mL | Status: DC
  Administered 2019-07-31 – 2019-08-04 (×23): 100 mL

## 2019-07-31 MED ORDER — METOPROLOL TARTRATE 25 MG/10 ML ORAL SUSPENSION
25.0000 mg | Freq: Two times a day (BID) | ORAL | Status: DC
  Administered 2019-07-31 – 2019-08-24 (×47): 25 mg
  Filled 2019-07-31 (×52): qty 10

## 2019-07-31 NOTE — Progress Notes (Signed)
Pt placed on 28% trach collar.  Tolerating well.

## 2019-07-31 NOTE — Progress Notes (Signed)
NAME:  Devon Peterson, MRN:  [id number], DOB:  1956-02-01, LOS: 24 ADMISSION DATE:  07/07/2019, CONSULTATION DATE:  07/09/19  REFERRING MD:  Alfredia Ferguson, CHIEF COMPLAINT:  Post-operative hypotension   Brief History   64 yo male smoker admitted 5/04 with aspiration pneumonia and found to have hypopharyngeal mass.  Had direct laryngoscopy with biopsy and tracheostomy on 5/06.  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      Past Medical History  COPD, Cocaine abuse  Significant Hospital Events   5/04 admitted 5/05 seen by ENT as initial evaluation underwent flexible laryngoscopy 5/06 to operating room for direct laryngoscopy and biopsy, and tracheostomy placement pulmonary asked to evaluate postop for hypotension 5/11 G tube placed 5/23 tracheostomy bleeding >> no interventions needs 5/24 transfer to ICU for respiratory distress; placed on ventilator and changed to #4 cuffed trach (unable to pass #6 cuffed trach); ENT changed to #6 cuffed trach 5/27 episode of a flutter >> started amiodarone  Consults:  ENT Oncology Radiation oncology Palliative care  Procedures:  Tracheostomy 5/06 >> G tube 5/11 >>   Significant Diagnostic Tests:  CT angio chest 5/04 >> atherosclerosis, severe emphysema, b/l lower lobe consolidation Lt > Rt, 2.7 cm mass LLL CT neck 5/05 >> enhancing hypopharyngeal soft tissue in post cricoid region  Micro Data:  SARS CoV2 PCR 5/04 >> negative Blood 5/04 >> negative Sputum 5/24 >> normal flora  Antimicrobials:  Unasyn 5/04 >> 5/11  Interim history/subjective:  Pt refusing therapies - PT, blood sugars, radiation  Afebrile  On PSV 10/5 Remains in AF   Objective   Blood pressure 104/84, pulse (!) 121, temperature 98.4 F (36.9 C), temperature source Oral, resp. rate 16, height 5\' 11"  (1.803 m), weight 44 kg, SpO2 100 %.    Vent Mode: PSV;CPAP FiO2 (%):  [30 %] 30 % Set Rate:  [16 bmp] 16 bmp Vt Set:  [600 mL] 600 mL PEEP:  [5 cmH20] 5  cmH20 Pressure Support:  [10 cmH20] 10 cmH20 Plateau Pressure:  [21 cmH20-22 cmH20] 21 cmH20   Intake/Output Summary (Last 24 hours) at 07/31/2019 0935 Last data filed at 07/31/2019 0600 Gross per 24 hour  Intake 318.01 ml  Output 1800 ml  Net -1481.99 ml   Filed Weights   07/07/19 2049 07/09/19 0949 07/20/19 1600  Weight: 42 kg 42 kg 44 kg    Examination: General: frail/cachectic male lying in bed on vent in NAD HEENT: MM pink/moist, trach midline c/d/i Neuro: Awake, alert, communicates by writing on dry erase board CV: s1s2 RRR, Sr on monitor, no m/r/g PULM:  Non-labored on PSV, lungs bilaterally clear  GI: soft, bsx4 active  Extremities: warm/dry, no edema, muscle wasting  Skin: no rashes or lesions  Resolved problems:  Aspiration pneumonia  Assessment & Plan:   Acute hypoxic respiratory failure. COPD with emphysema. Changed to #6 cuffed trach by ENT on 5/24 -PRVC 8cc/kg as rest mode -PSV with goal ATC as tolerated  -wean O2 for sats >90% -continue xopenex, atrovaent TID, xopenex Q4 PRN  -follow intermittent CXR  Squamous cell carcinoma of larynx. Lt lower lung mass. -per ONC / Radiation ONC  Goals of care. -appreciate palliative care input  -if he continues to refuse therapies, may need to revisit with patient / family on GOC  Transient A fib/flutter new this admission. -remains in SR -PRN lopressor for HR>115  Hypotension from hypovolemia. -resolved  Dysphagia. Cachexia. Severe Protein Calorie Malnutrition -continue bolus feeding  Anemia of critical illness, iron deficiency,  and chronic disease. -trend CBC -transfuse for Hgb <7% -continue feosol  ETOH, cocaine abuse. -thiamine, folate, MVI   Stage 2 sacral pressure ulcer. POA -WOC   Depression. -continue zoloft     Best practice:  Diet: tube feeds DVT prophylaxis: SQ heparin GI prophylaxis: protonix Mobility: bed rest Code Status: full code  Disposition: ICU  Labs:   CMP Latest  Ref Rng & Units 07/31/2019 07/30/2019 07/29/2019  Glucose 70 - 99 mg/dL 76 114(H) 92  BUN 8 - 23 mg/dL 11 14 14   Creatinine 0.61 - 1.24 mg/dL 0.43(L) 0.44(L) 0.43(L)  Sodium 135 - 145 mmol/L 132(L) 132(L) 133(L)  Potassium 3.5 - 5.1 mmol/L 3.5 4.1 3.7  Chloride 98 - 111 mmol/L 100 98 98  CO2 22 - 32 mmol/L 21(L) 24 24  Calcium 8.9 - 10.3 mg/dL 8.6(L) 8.6(L) 8.9  Total Protein 6.5 - 8.1 g/dL - - -  Total Bilirubin 0.3 - 1.2 mg/dL - - -  Alkaline Phos 38 - 126 U/L - - -  AST 15 - 41 U/L - - -  ALT 0 - 44 U/L - - -    CBC Latest Ref Rng & Units 07/31/2019 07/30/2019 07/29/2019  WBC 4.0 - 10.5 K/uL 2.9(L) 4.6 -  Hemoglobin 13.0 - 17.0 g/dL 8.1(L) 8.0(L) 9.1(L)  Hematocrit 39.0 - 52.0 % 23.9(L) 24.2(L) 27.0(L)  Platelets 150 - 400 K/uL 303 249 -    ABG    Component Value Date/Time   PHART 7.299 (L) 07/27/2019 1655   PCO2ART 60.2 (H) 07/27/2019 1655   PO2ART 32.8 (LL) 07/27/2019 1655   HCO3 28.6 (H) 07/27/2019 1655   O2SAT 44.3 07/27/2019 1655     CBG (last 3)  Recent Labs    07/30/19 1926 07/31/19 0307 07/31/19 0749  GLUCAP 106* 89 79    Signature:   [name], MSN, NP-C [facility] 07/31/2019, 9:42 AM   Please see [email or web address] for pager details.

## 2019-07-31 NOTE — Progress Notes (Signed)
Patient is refusing all services: No vital signs, blood sugar sticks or medications. Will try again at 04:00 am rounds.

## 2019-07-31 NOTE — Evaluation (Signed)
Physical Therapy Re-Evaluation Patient Details Name: Devon Peterson MRN: [id number] DOB: [birth year] Today's Date: 07/31/2019   History of Present Illness  64 yo male admitted with aspiration pneumonia, with newly identified mass located in the hypopharynx.  Went to the operating room on 5/6 for direct laryngoscopy, biopsy, and tracheostomy.  Marland Kitchen Hx of ETOH abuse, COPD, dysphagia, polysubstance abuse, malnutrition. S/P PEG, reintubated and now on trach collar, 28%.  Clinical Impression  The patient was assisted onto bed pan, then with much encouragement, patient did mobilize and transfer to recliner using Rw and mod Assistance. SPO2 98% on 28% TC/5 L. Pt admitted with above diagnosis.  Pt currently with functional limitations due to the deficits listed below (see PT Problem List). Pt will benefit from skilled PT to increase their independence and safety with mobility to allow discharge to the venue listed below.       Follow Up Recommendations SNF    Equipment Recommendations  None recommended by PT    Recommendations for Other Services       Precautions / Restrictions Precautions Precautions: Fall Precaution Comments: trach on 28% TC; PEG/NPO Restrictions Weight Bearing Restrictions: No      Mobility  Bed Mobility Overal bed mobility: Needs Assistance Bed Mobility: Supine to Sit Rolling: Supervision   Supine to sit: Min guard     General bed mobility comments: patient required no assistance  Transfers Overall transfer level: Needs assistance Equipment used: Rolling walker (2 wheeled) Transfers: Sit to/from Stand Sit to Stand: Min assist Stand pivot transfers: Min assist       General transfer comment: patient shoving therapist out of way, moving RW and able to take a few steps to recliner  Ambulation/Gait                Stairs            Wheelchair Mobility    Modified Rankin (Stroke Patients Only)       Balance   Sitting-balance support: No  upper extremity supported;Feet supported Sitting balance-Leahy Scale: Good     Standing balance support: Bilateral upper extremity supported;During functional activity Standing balance-Leahy Scale: Fair Standing balance comment: reliant on RW                             Pertinent Vitals/Pain Pain Assessment: No/denies pain    Home Living Family/patient expects to be discharged to:: Private residence Living Arrangements: Children;Other relatives Available Help at Discharge: Family Type of Home: House Home Access: Stairs to enter Entrance Stairs-Rails: Right Entrance Stairs-Number of Steps: 3 Home Layout: One level Home Equipment: [place] - 2 wheels;Cane - single point Additional Comments: plans SNF    Prior Function Level of Independence: Independent         Comments: per pt, getting around home "somewhat". family assisting as needed.     Hand Dominance        Extremity/Trunk Assessment   Upper Extremity Assessment Upper Extremity Assessment: Generalized weakness    Lower Extremity Assessment Lower Extremity Assessment: Generalized weakness    Cervical / Trunk Assessment Cervical / Trunk Assessment: Kyphotic  Communication      Cognition Arousal/Alertness: Awake/alert Behavior During Therapy: Flat affect Overall Cognitive Status: Within Functional Limits for tasks assessed                                 General Comments: writes on  board to communicate.      General Comments      Exercises     Assessment/Plan    PT Assessment Patient needs continued PT services  PT Problem List Decreased strength;Decreased mobility;Decreased activity tolerance;Decreased balance;Decreased knowledge of use of DME       PT Treatment Interventions DME instruction;Gait training;Therapeutic activities;Therapeutic exercise;Patient/family education;Balance training;Functional mobility training    PT Goals (Current goals can be found in the Care  Plan section)  Acute Rehab PT Goals Patient Stated Goal: agreed to mobility PT Goal Formulation: With patient Time For Goal Achievement: 08/14/19 Potential to Achieve Goals: Fair    Frequency Min 2X/week   Barriers to discharge        Co-evaluation PT/OT/SLP Co-Evaluation/Treatment: Yes Reason for Co-Treatment: Complexity of the patient's impairments (multi-system involvement);For patient/therapist safety;To address functional/ADL transfers PT goals addressed during session: Mobility/safety with mobility OT goals addressed during session: ADL's and self-care       AM-PAC PT "6 Clicks" Mobility  Outcome Measure Help needed turning from your back to your side while in a flat bed without using bedrails?: A Little Help needed moving from lying on your back to sitting on the side of a flat bed without using bedrails?: A Little Help needed moving to and from a bed to a chair (including a wheelchair)?: A Little Help needed standing up from a chair using your arms (e.g., wheelchair or bedside chair)?: A Lot Help needed to walk in hospital room?: Total Help needed climbing 3-5 steps with a railing? : Total 6 Click Score: 13    End of Session Equipment Utilized During Treatment: Oxygen Activity Tolerance: Treatment limited secondary to agitation Patient left: in chair;with call bell/phone within reach;with chair alarm set Nurse Communication: Mobility status PT Visit Diagnosis: Muscle weakness (generalized) (M62.81);Difficulty in walking, not elsewhere classified (R26.2)    Time: 6861-6837 PT Time Calculation (min) (ACUTE ONLY): 34 min   Charges:   PT Evaluation $PT Re-evaluation: 1 Re-eval          Tresa Endo PT Acute Rehabilitation Services Pager [phone] Office [phone]   Devon Peterson 07/31/2019, 12:58 PM

## 2019-07-31 NOTE — Progress Notes (Signed)
Occupational Therapy Treatment Patient Details Name: Devon Peterson MRN: [id number] DOB: 1955/11/21 Today's Date: 07/31/2019    History of present illness 64 yo male admitted with aspiration pneumonia, with newly identified mass located in the hypopharynx.  Went to the operating room on 5/6 for direct laryngoscopy, biopsy, and tracheostomy.  Marland Kitchen Hx of ETOH abuse, COPD, dysphagia, polysubstance abuse, malnutrition. S/P PEG   OT comments  Treatment focused on getting patient to participate and engage in therapy in order to advance patient's abilities. Patient educated on therapeutic process. Patient able to roll in bed for toileting/pericare task but not assist in actual hygiene activity. Patient refused use of BSC. Cont POC.    Follow Up Recommendations  LTACH;SNF    Equipment Recommendations  Other (comment)    Recommendations for Other Services      Precautions / Restrictions Precautions Precautions: Fall Precaution Comments: trach on 28% TC; PEG/NPO Restrictions Weight Bearing Restrictions: No       Mobility Bed Mobility Overal bed mobility: Needs Assistance Bed Mobility: Supine to Sit Rolling: Supervision   Supine to sit: Min guard     General bed mobility comments: patient required no assistance  Transfers Overall transfer level: Needs assistance Equipment used: Rolling walker (2 wheeled) Transfers: Sit to/from Stand Sit to Stand: Min assist Stand pivot transfers: Min assist       General transfer comment: patient shoving therapist out of way, moving RW and able to take a few steps to recliner    Balance   Sitting-balance support: No upper extremity supported;Feet supported Sitting balance-Leahy Scale: Good     Standing balance support: Bilateral upper extremity supported;During functional activity Standing balance-Leahy Scale: Fair Standing balance comment: reliant on RW                           ADL either performed or assessed with clinical  judgement   ADL                               Toileting- Clothing Manipulation and Hygiene: Total assistance Toileting - Clothing Manipulation Details (indicate cue type and reason): Patient refused to use bsc and wanted bedpan despite ecouragment. Patient able to roll left and right for assisting with mobility during pericare but total care for actual clothing management/hygiene aspects of pericare.             Vision       Perception     Praxis      Cognition Arousal/Alertness: Awake/alert Behavior During Therapy: Flat affect Overall Cognitive Status: Within Functional Limits for tasks assessed                                 General Comments: writes on board to communicate.        Exercises     Shoulder Instructions       General Comments      Pertinent Vitals/ Pain       Pain Assessment: No/denies pain  Home Living Family/patient expects to be discharged to:: Private residence Living Arrangements: Children;Other relatives Available Help at Discharge: Family Type of Home: House Home Access: Stairs to enter [employer] of Steps: 3 Entrance Stairs-Rails: Right Home Layout: One level               Home Equipment: [place] - 2 wheels;Cane - single  point   Additional Comments: plans SNF      Prior Functioning/Environment Level of Independence: Independent        Comments: per pt, getting around home "somewhat". family assisting as needed.   Frequency  Min 2X/week        Progress Toward Goals  OT Goals(current goals can now be found in the care plan section)     Acute Rehab OT Goals Patient Stated Goal: agreed to mobility  Plan      Co-evaluation    PT/OT/SLP Co-Evaluation/Treatment: Yes Reason for Co-Treatment: Complexity of the patient's impairments (multi-system involvement);For patient/therapist safety;To address functional/ADL transfers PT goals addressed during session: Mobility/safety with  mobility OT goals addressed during session: ADL's and self-care      AM-PAC OT "6 Clicks" Daily Activity     Outcome Measure                    End of Session    OT Visit Diagnosis: Unsteadiness on feet (R26.81);Other abnormalities of gait and mobility (R26.89);Muscle weakness (generalized) (M62.81)   Activity Tolerance Patient limited by fatigue   Patient Left in chair;with chair alarm set   Nurse Communication          Time: [phone] OT Time Calculation (min): 34 min  Charges: OT General Charges $OT Visit: 1 Visit OT Treatments $Self Care/Home Management : 8-22 mins  Derl Barrow, OTR/L [place]  Office [phone]    Lenward Chancellor 07/31/2019, 1:04 PM

## 2019-08-01 LAB — GLUCOSE, CAPILLARY
Glucose-Capillary: 102 mg/dL — ABNORMAL HIGH (ref 70–99)
Glucose-Capillary: 112 mg/dL — ABNORMAL HIGH (ref 70–99)
Glucose-Capillary: 129 mg/dL — ABNORMAL HIGH (ref 70–99)
Glucose-Capillary: 140 mg/dL — ABNORMAL HIGH (ref 70–99)
Glucose-Capillary: 65 mg/dL — ABNORMAL LOW (ref 70–99)
Glucose-Capillary: 70 mg/dL (ref 70–99)
Glucose-Capillary: 86 mg/dL (ref 70–99)
Glucose-Capillary: 98 mg/dL (ref 70–99)

## 2019-08-01 LAB — BASIC METABOLIC PANEL
Anion gap: 8 (ref 5–15)
BUN: 17 mg/dL (ref 8–23)
CO2: 25 mmol/L (ref 22–32)
Calcium: 8.3 mg/dL — ABNORMAL LOW (ref 8.9–10.3)
Chloride: 98 mmol/L (ref 98–111)
Creatinine, Ser: 0.37 mg/dL — ABNORMAL LOW (ref 0.61–1.24)
GFR calc Af Amer: >60 mL/min (ref >60)
GFR calc non Af Amer: >60 mL/min (ref >60)
Glucose, Bld: 75 mg/dL (ref 70–99)
Potassium: 3.8 mmol/L (ref 3.5–5.1)
Sodium: 131 mmol/L — ABNORMAL LOW (ref 135–145)

## 2019-08-01 LAB — CBC
HCT: 24.5 % — ABNORMAL LOW (ref 39.0–52.0)
Hemoglobin: 8 g/dL — ABNORMAL LOW (ref 13.0–17.0)
MCH: 28.3 pg (ref 26.0–34.0)
MCHC: 32.7 g/dL (ref 30.0–36.0)
MCV: 86.6 fL (ref 80.0–100.0)
Platelets: 306 10*3/uL (ref 150–400)
RBC: 2.83 MIL/uL — ABNORMAL LOW (ref 4.22–5.81)
RDW: 14.7 % (ref 11.5–15.5)
WBC: 2.3 10*3/uL — ABNORMAL LOW (ref 4.0–10.5)
nRBC: 0 % (ref 0.0–0.2)

## 2019-08-01 MED ORDER — DEXTROSE 50 % IV SOLN
INTRAVENOUS | Status: AC
  Administered 2019-08-01: 25 mL
  Filled 2019-08-01: qty 50

## 2019-08-01 MED ORDER — SIMETHICONE 40 MG/0.6ML PO SUSP
40.0000 mg | Freq: Four times a day (QID) | ORAL | Status: DC | PRN
  Administered 2019-08-01 – 2019-08-23 (×2): 40 mg
  Filled 2019-08-01 (×4): qty 0.6

## 2019-08-01 NOTE — Progress Notes (Signed)
NAME:  Devon Peterson, MRN:  [id number], DOB:  09-27-1955, LOS: 25 ADMISSION DATE:  07/07/2019, CONSULTATION DATE:  07/09/19  REFERRING MD:  Alfredia Ferguson, CHIEF COMPLAINT:  Post-operative hypotension   Brief History   64 yo male smoker admitted 5/04 with aspiration pneumonia and found to have hypopharyngeal mass.  Had direct laryngoscopy with biopsy and tracheostomy on 5/06.  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      Past Medical History  COPD, Cocaine abuse  Significant Hospital Events   5/04 admitted 5/05 seen by ENT as initial evaluation underwent flexible laryngoscopy 5/06 to operating room for direct laryngoscopy and biopsy, and tracheostomy placement pulmonary asked to evaluate postop for hypotension 5/11 G tube placed 5/23 tracheostomy bleeding >> no interventions needs 5/24 transfer to ICU for respiratory distress; placed on ventilator and changed to #4 cuffed trach (unable to pass #6 cuffed trach); ENT changed to #6 cuffed trach 5/27 episode of a flutter >  Amiodarone boluses only   Consults:  ENT Oncology Radiation oncology Palliative care  Procedures:  Tracheostomy 5/06 >> G tube 5/11 >>   Significant Diagnostic Tests:  CT angio chest 5/04 >> atherosclerosis, severe emphysema, b/l lower lobe consolidation Lt > Rt, 2.7 cm mass LLL CT neck 5/05 >> enhancing hypopharyngeal soft tissue in post cricoid region  Micro Data:  SARS CoV2 PCR 5/04 >> negative Blood 5/04 >> negative Sputum 5/24 >> normal flora  Antimicrobials:  Unasyn 5/04 >> 5/11  Interim history/subjective:  Tolerating T collar fine  Objective   Blood pressure 93/75, pulse 89, temperature 98.2 F (36.8 C), temperature source Oral, resp. rate 18, height 5\' 11"  (1.803 m), weight 44 kg, SpO2 100 %.    FiO2 (%):  [28 %] 28 %   Intake/Output Summary (Last 24 hours) at 08/01/2019 0946 Last data filed at 07/31/2019 1800 Gross per 24 hour  Intake --  Output 550 ml  Net -550 ml   Filed  Weights   07/07/19 2049 07/09/19 0949 07/20/19 1600  Weight: 42 kg 42 kg 44 kg    Examination:  Tmax  98.7 Cachectic chronically ill appearing but  nad @ 30 degrees hob Oropharynx clear,  mucosa nl Lungs with a few scattered exp > insp rhonchi bilaterally RRR no s3 or or sign murmur Abd soft but  limited  excursion  Extr warm with no edema or clubbing noted/ muscle wasting diffusely  Neuro    no apparent motor deficits     I personally reviewed images and agree with radiology impression as follows:  pCXR:   5/28  No significant change in AP portable examination. There is probable left basilar atelectasis or consolidation and associated layering pleural effusion. No new airspace opacity   Resolved problems:  Aspiration pneumonia  Assessment & Plan:   Acute hypoxic respiratory failure. COPD with emphysema. Changed to #6 cuffed trach by ENT on 5/24 Tcollar since am 5/28 tol well     >>> continue xopenex, atrovent TID, xopenex Q4 PRN     Squamous cell carcinoma of larynx. Lt lower lung mass. -per ONC / Radiation ONC  Goals of care. -appreciate palliative care input  -if he continues to refuse therapies  >>>> revisit with patient / family on GOC  Transient A fib/flutter new this admission. -maintaining  NSR -PRN lopressor for HR>115  Hypotension from hypovolemia. -resolved  Dysphagia. Cachexia. Severe Protein Calorie Malnutrition -continue bolus feeding  Anemia of critical illness, iron deficiency, and chronic disease.   Lab Results  Component Value Date   HGB 8.0 (L) 08/01/2019   HGB 8.1 (L) 07/31/2019   HGB 8.0 (L) 07/30/2019    -transfuse for Hgb <7% -continue feosol  ETOH, cocaine abuse. -thiamine, folate, MVI   Stage 2 sacral pressure ulcer. POA -WOC   Depression. -continue zoloft     Best practice:  Diet: tube feeds DVT prophylaxis: SQ heparin GI prophylaxis: protonix Mobility: bed rest Code Status: full code  Disposition:  ICU  Labs:   CMP Latest Ref Rng & Units 08/01/2019 07/31/2019 07/30/2019  Glucose 70 - 99 mg/dL 75 76 114(H)  BUN 8 - 23 mg/dL 17 11 14   Creatinine 0.61 - 1.24 mg/dL 0.37(L) 0.43(L) 0.44(L)  Sodium 135 - 145 mmol/L 131(L) 132(L) 132(L)  Potassium 3.5 - 5.1 mmol/L 3.8 3.5 4.1  Chloride 98 - 111 mmol/L 98 100 98  CO2 22 - 32 mmol/L 25 21(L) 24  Calcium 8.9 - 10.3 mg/dL 8.3(L) 8.6(L) 8.6(L)  Total Protein 6.5 - 8.1 g/dL - - -  Total Bilirubin 0.3 - 1.2 mg/dL - - -  Alkaline Phos 38 - 126 U/L - - -  AST 15 - 41 U/L - - -  ALT 0 - 44 U/L - - -    CBC Latest Ref Rng & Units 08/01/2019 07/31/2019 07/30/2019  WBC 4.0 - 10.5 K/uL 2.3(L) 2.9(L) 4.6  Hemoglobin 13.0 - 17.0 g/dL 8.0(L) 8.1(L) 8.0(L)  Hematocrit 39.0 - 52.0 % 24.5(L) 23.9(L) 24.2(L)  Platelets 150 - 400 K/uL 306 303 249    ABG    Component Value Date/Time   PHART 7.299 (L) 07/27/2019 1655   PCO2ART 60.2 (H) 07/27/2019 1655   PO2ART 32.8 (LL) 07/27/2019 1655   HCO3 28.6 (H) 07/27/2019 1655   O2SAT 44.3 07/27/2019 1655     CBG (last 3)  Recent Labs    08/01/19 0410 08/01/19 0745 08/01/19 0919  GLUCAP 70 65* 98      Christinia Gully, MD Pulmonary and [place] [facility] [phone] After 6:00 PM or weekends, use Beeper [phone]  After 7:00 pm call Elink  [phone]

## 2019-08-01 NOTE — Progress Notes (Signed)
eLink Physician-Brief Progress Note Patient Name: Devon Peterson DOB: February 06, 1956 MRN: [id number]   Date of Service  08/01/2019  HPI/Events of Note  Dyspepsia  eICU Interventions  PRN Mylicon ordered for dyspepsia.        Kerry Kass Tonya Carlile 08/01/2019, 1:07 AM

## 2019-08-02 LAB — GLUCOSE, CAPILLARY
Glucose-Capillary: 83 mg/dL (ref 70–99)
Glucose-Capillary: 68 mg/dL — ABNORMAL LOW (ref 70–99)
Glucose-Capillary: 115 mg/dL — ABNORMAL HIGH (ref 70–99)
Glucose-Capillary: 103 mg/dL — ABNORMAL HIGH (ref 70–99)
Glucose-Capillary: 84 mg/dL (ref 70–99)
Glucose-Capillary: 113 mg/dL — ABNORMAL HIGH (ref 70–99)

## 2019-08-02 MED ORDER — DEXTROSE 50 % IV SOLN
25.0000 mL | Freq: Once | INTRAVENOUS | Status: AC
  Administered 2019-08-02: 25 mL via INTRAVENOUS

## 2019-08-02 NOTE — Progress Notes (Signed)
Placed PT on new aerosol trach collar- 28% (5 LPM).

## 2019-08-02 NOTE — Progress Notes (Signed)
NAME:  Devon Peterson, MRN:  [id number], DOB:  1955/09/11, LOS: 26 ADMISSION DATE:  07/07/2019, CONSULTATION DATE:  07/09/19  REFERRING MD:  Alfredia Ferguson, CHIEF COMPLAINT:  Post-operative hypotension   Brief History   64 yo male smoker admitted 5/04 with aspiration pneumonia and found to have hypopharyngeal mass.  Had direct laryngoscopy with biopsy and tracheostomy on 5/06.  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      Past Medical History  COPD, Cocaine abuse  Significant Hospital Events   5/04 admitted 5/05 seen by ENT as initial evaluation underwent flexible laryngoscopy 5/06 to operating room for direct laryngoscopy and biopsy, and tracheostomy placement pulmonary asked to evaluate postop for hypotension 5/11 G tube placed 5/23 tracheostomy bleeding >> no interventions needs 5/24 transfer to ICU for respiratory distress; placed on ventilator and changed to #4 cuffed trach (unable to pass #6 cuffed trach); ENT changed to #6 cuffed trach 5/27 episode of a flutter >  Amiodarone boluses only   Consults:  ENT Oncology Radiation oncology Palliative care  Procedures:  Tracheostomy 5/06 >> G tube 5/11 >>   Significant Diagnostic Tests:  CT angio chest 5/04 >> atherosclerosis, severe emphysema, b/l lower lobe consolidation Lt > Rt, 2.7 cm mass LLL CT neck 5/05 >> enhancing hypopharyngeal soft tissue in post cricoid region  Micro Data:  SARS CoV2 PCR 5/04 >> negative Blood 5/04 >> negative Sputum 5/24 >> no org seen > normal flora  Antimicrobials:  Unasyn 5/04 >> 5/11   Scheduled Meds: . ascorbic acid  100 mg Per Tube BID  . chlorhexidine gluconate (MEDLINE KIT)  15 mL Mouth Rinse BID  . Chlorhexidine Gluconate Cloth  6 each Topical Daily  . docusate  100 mg Oral BID  . feeding supplement (OSMOLITE 1.5 CAL)  237 mL Per Tube TID  . feeding supplement (PRO-STAT SUGAR FREE 64)  30 mL Per Tube TID  . ferrous sulfate  300 mg Per Tube BID WC  . free water  100 mL Per  Tube Q4H  . ipratropium  0.5 mg Nebulization TID  . levalbuterol  0.63 mg Nebulization TID  . mouth rinse  15 mL Mouth Rinse 10 times per day  . metoprolol tartrate  25 mg Per Tube BID  . pantoprazole sodium  40 mg Per Tube Q24H  . polyethylene glycol  17 g Oral Daily  . senna-docusate  1 tablet Oral BID  . sertraline  50 mg Per Tube Daily  . thiamine  100 mg Per Tube Daily   Continuous Infusions: PRN Meds:.fentaNYL (SUBLIMAZE) injection, levalbuterol, metoprolol tartrate, midazolam, ondansetron (ZOFRAN) IV, simethicone, sodium chloride flush, Sonafine  Interim history/subjective:     Objective   Blood pressure 90/61, pulse 63, temperature 98.4 F (36.9 C), temperature source Oral, resp. rate 18, height '5\' 11"'$  (1.803 m), weight 44 kg, SpO2 100 %.    FiO2 (%):  [28 %] 28 %   Intake/Output Summary (Last 24 hours) at 08/02/2019 0818 Last data filed at 08/02/2019 0600 Gross per 24 hour  Intake 1011 ml  Output 1325 ml  Net -314 ml   Filed Weights   07/07/19 2049 07/09/19 0949 07/20/19 1600  Weight: 42 kg 42 kg 44 kg    Examination:  Tmax   98.8  Cachectic chronically ill  nad on t collar 28%  Pt alert, approp nad @ 45 degrees Neck supple trach site ok  Lungs with a few scattered exp > insp rhonchi bilaterally RRR no s3 or or sign  murmur Abd soft/ g tube in place, limited excursion  Extr warm with no edema or clubbing noted Neuro  Sensorium approp responses,  no apparent motor deficits       Resolved problems:  Aspiration pneumonia  Assessment & Plan:   Acute hypoxic respiratory failure. COPD with emphysema. Changed to #6 cuffed trach by ENT on 5/24 Tcollar since am 5/28 tol well > d/c vent 5/30     >>> continue xopenex, atrovent TID, xopenex Q4 PRN     Squamous cell carcinoma of larynx. Lt lower lung mass. -per ONC / Radiation ONC  Goals of care. -  palliative care input noted -  if he continues to refuse therapies  >>>> revisit with patient / family on  GOC  Transient A fib/flutter new this admission. -maintaining  NSR -PRN lopressor for HR>115  Hypotension from hypovolemia. -resolved  Dysphagia. Cachexia. Severe Protein Calorie Malnutrition -continue bolus feeding  Anemia of critical illness, iron deficiency, and chronic disease.   Lab Results  Component Value Date   HGB 8.0 (L) 08/01/2019   HGB 8.1 (L) 07/31/2019   HGB 8.0 (L) 07/30/2019   -transfuse for Hgb <7% -continue feosol >>> recheck cbc 5/31   ETOH, cocaine abuse. -thiamine, folate, MVI   Stage 2 sacral pressure ulcer. Present on admit -WOC   Depression. -continue zoloft     Best practice:  Diet: bolus tube feeds DVT prophylaxis: PAS GI prophylaxis: protonix Mobility: bed rest Code Status: full code  Disposition: ICU  Labs:   CMP Latest Ref Rng & Units 08/01/2019 07/31/2019 07/30/2019  Glucose 70 - 99 mg/dL 75 76 114(H)  BUN 8 - 23 mg/dL '17 11 14  '$ Creatinine 0.61 - 1.24 mg/dL 0.37(L) 0.43(L) 0.44(L)  Sodium 135 - 145 mmol/L 131(L) 132(L) 132(L)  Potassium 3.5 - 5.1 mmol/L 3.8 3.5 4.1  Chloride 98 - 111 mmol/L 98 100 98  CO2 22 - 32 mmol/L 25 21(L) 24  Calcium 8.9 - 10.3 mg/dL 8.3(L) 8.6(L) 8.6(L)  Total Protein 6.5 - 8.1 g/dL - - -  Total Bilirubin 0.3 - 1.2 mg/dL - - -  Alkaline Phos 38 - 126 U/L - - -  AST 15 - 41 U/L - - -  ALT 0 - 44 U/L - - -    CBC Latest Ref Rng & Units 08/01/2019 07/31/2019 07/30/2019  WBC 4.0 - 10.5 K/uL 2.3(L) 2.9(L) 4.6  Hemoglobin 13.0 - 17.0 g/dL 8.0(L) 8.1(L) 8.0(L)  Hematocrit 39.0 - 52.0 % 24.5(L) 23.9(L) 24.2(L)  Platelets 150 - 400 K/uL 306 303 249    ABG    Component Value Date/Time   PHART 7.299 (L) 07/27/2019 1655   PCO2ART 60.2 (H) 07/27/2019 1655   PO2ART 32.8 (LL) 07/27/2019 1655   HCO3 28.6 (H) 07/27/2019 1655   O2SAT 44.3 07/27/2019 1655     CBG (last 3)  Recent Labs    08/01/19 2353 08/02/19 0300 08/02/19 0759  GLUCAP 140* 83 68*      Christinia Gully, MD Pulmonary and [place] [phone] After 6:00 PM or weekends, use Beeper [phone]  After 7:00 pm call Elink  [phone]

## 2019-08-03 LAB — GLUCOSE, CAPILLARY
Glucose-Capillary: 81 mg/dL (ref 70–99)
Glucose-Capillary: 73 mg/dL (ref 70–99)
Glucose-Capillary: 99 mg/dL (ref 70–99)
Glucose-Capillary: 84 mg/dL (ref 70–99)
Glucose-Capillary: 78 mg/dL (ref 70–99)
Glucose-Capillary: 118 mg/dL — ABNORMAL HIGH (ref 70–99)

## 2019-08-03 LAB — BASIC METABOLIC PANEL
Anion gap: 10 (ref 5–15)
BUN: 19 mg/dL (ref 8–23)
CO2: 28 mmol/L (ref 22–32)
Calcium: 8.6 mg/dL — ABNORMAL LOW (ref 8.9–10.3)
Chloride: 99 mmol/L (ref 98–111)
Creatinine, Ser: 0.43 mg/dL — ABNORMAL LOW (ref 0.61–1.24)
GFR calc Af Amer: >60 mL/min (ref >60)
GFR calc non Af Amer: >60 mL/min (ref >60)
Glucose, Bld: 89 mg/dL (ref 70–99)
Potassium: 4.1 mmol/L (ref 3.5–5.1)
Sodium: 137 mmol/L (ref 135–145)

## 2019-08-03 LAB — CBC
HCT: 26.4 % — ABNORMAL LOW (ref 39.0–52.0)
Hemoglobin: 8.5 g/dL — ABNORMAL LOW (ref 13.0–17.0)
MCH: 28.3 pg (ref 26.0–34.0)
MCHC: 32.2 g/dL (ref 30.0–36.0)
MCV: 88 fL (ref 80.0–100.0)
Platelets: 343 10*3/uL (ref 150–400)
RBC: 3 MIL/uL — ABNORMAL LOW (ref 4.22–5.81)
RDW: 15.1 % (ref 11.5–15.5)
WBC: 2.8 10*3/uL — ABNORMAL LOW (ref 4.0–10.5)
nRBC: 0 % (ref 0.0–0.2)

## 2019-08-03 NOTE — Progress Notes (Signed)
NAME:  Devon Peterson, MRN:  [id number], DOB:  [birth year], LOS: 64 ADMISSION DATE:  07/07/2019, CONSULTATION DATE:  07/09/19  REFERRING MD:  Alfredia Ferguson, CHIEF COMPLAINT:  Post-operative hypotension   Brief History   64 yo male smoker admitted 5/04 with aspiration pneumonia and found to have hypopharyngeal mass.  Had direct laryngoscopy with biopsy and tracheostomy on 5/06.  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      Past Medical History  COPD, Cocaine abuse  Significant Hospital Events   5/04 admitted 5/05 seen by ENT as initial evaluation underwent flexible laryngoscopy 5/06 to operating room for direct laryngoscopy and biopsy, and tracheostomy placement pulmonary asked to evaluate postop for hypotension 5/11 G tube placed 5/23 tracheostomy bleeding >> no interventions needs 5/24 transfer to ICU for respiratory distress; placed on ventilator and changed to #4 cuffed trach (unable to pass #6 cuffed trach); ENT changed to #6 cuffed trach 5/27 episode of a flutter >  Amiodarone boluses only   Consults:  ENT Oncology Radiation oncology Palliative care  Procedures:  Tracheostomy 5/06 >> G tube 5/11 >>   Significant Diagnostic Tests:  CT angio chest 5/04 >> atherosclerosis, severe emphysema, b/l lower lobe consolidation Lt > Rt, 2.7 cm mass LLL CT neck 5/05 >> enhancing hypopharyngeal soft tissue in post cricoid region  Micro Data:  SARS CoV2 PCR 5/04 >> negative Blood 5/04 >> negative Sputum 5/24 >> no org seen > normal flora  Antimicrobials:  Unasyn 5/04 >> 5/11   Scheduled Meds: . ascorbic acid  100 mg Per Tube BID  . chlorhexidine gluconate (MEDLINE KIT)  15 mL Mouth Rinse BID  . Chlorhexidine Gluconate Cloth  6 each Topical Daily  . docusate  100 mg Oral BID  . feeding supplement (OSMOLITE 1.5 CAL)  237 mL Per Tube TID  . feeding supplement (PRO-STAT SUGAR FREE 64)  30 mL Per Tube TID  . ferrous sulfate  300 mg Per Tube BID WC  . free water  100 mL Per  Tube Q4H  . ipratropium  0.5 mg Nebulization TID  . levalbuterol  0.63 mg Nebulization TID  . mouth rinse  15 mL Mouth Rinse 10 times per day  . metoprolol tartrate  25 mg Per Tube BID  . pantoprazole sodium  40 mg Per Tube Q24H  . polyethylene glycol  17 g Oral Daily  . senna-docusate  1 tablet Oral BID  . sertraline  50 mg Per Tube Daily  . thiamine  100 mg Per Tube Daily   Continuous Infusions: PRN Meds:.fentaNYL (SUBLIMAZE) injection, levalbuterol, metoprolol tartrate, midazolam, ondansetron (ZOFRAN) IV, simethicone, sodium chloride flush, Sonafine  Interim history/subjective:   secretions remain thick but not purulent,  To resume RT 6/1 which per nursing pt tolerates poorly   Objective   Blood pressure 103/63, pulse 65, temperature 98.2 F (36.8 C), temperature source Oral, resp. rate (!) 21, height '5\' 11"'$  (1.803 m), weight 44 kg, SpO2 100 %.    FiO2 (%):  [28 %] 28 %   Intake/Output Summary (Last 24 hours) at 08/03/2019 1116 Last data filed at 08/03/2019 0800 Gross per 24 hour  Intake 974 ml  Output 1150 ml  Net -176 ml   Filed Weights   07/07/19 2049 07/09/19 0949 07/20/19 1600  Weight: 42 kg 42 kg 44 kg    Examination:  Tmax   99.2 Pt alert but somber/ no increased wob on 0.28 t collar Neck supple Lungs with a few scattered exp > insp rhonchi  bilaterally RRR no s3 or or sign murmur Abd soft with limited excursion / PEG in place tol bolus feeding s Extr warm with no edema or clubbing noted        Resolved problems:  Aspiration pneumonia  Assessment & Plan:   Acute hypoxic respiratory failure. COPD with emphysema. Changed to #6 cuffed trach by ENT on 5/24 Tcollar since am 5/28 tol well > d/c vent 5/30     >>> continue xopenex, atrovent TID, xopenex Q4 PRN     Squamous cell carcinoma of larynx. Lt lower lung mass. -per ONC / Radiation ONC> to resume RT 6/1   Goals of care. -  palliative care input noted -  if he continues to refuse therapies  >>>>  revisit with patient / family on GOC  Transient A fib/flutter new this admission. -maintaining  NSR -continue PRN lopressor for HR>115  Hypotension from hypovolemia. -resolved  Dysphagia. Cachexia. Severe Protein Calorie Malnutrition -continue bolus feeding  Anemia of critical illness, iron deficiency, and chronic disease.   Lab Results  Component Value Date   HGB 8.5 (L) 08/03/2019   HGB 8.0 (L) 08/01/2019   HGB 8.1 (L) 07/31/2019  apperas to be improving  -transfuse for Hgb <7% -continue feosol    ETOH, cocaine abuse. -thiamine, folate, MVI   Stage 2 sacral pressure ulcer. Present on admit -WOC   Depression. -continue zoloft     Best practice:  Diet: bolus tube feeds DVT prophylaxis: PAS GI prophylaxis: protonix Mobility: bed rest Code Status: full code  Disposition step down  Labs:   CMP Latest Ref Rng & Units 08/03/2019 08/01/2019 07/31/2019  Glucose 70 - 99 mg/dL 89 75 76  BUN 8 - 23 mg/dL '19 17 11  '$ Creatinine 0.61 - 1.24 mg/dL 0.43(L) 0.37(L) 0.43(L)  Sodium 135 - 145 mmol/L 137 131(L) 132(L)  Potassium 3.5 - 5.1 mmol/L 4.1 3.8 3.5  Chloride 98 - 111 mmol/L 99 98 100  CO2 22 - 32 mmol/L 28 25 21(L)  Calcium 8.9 - 10.3 mg/dL 8.6(L) 8.3(L) 8.6(L)  Total Protein 6.5 - 8.1 g/dL - - -  Total Bilirubin 0.3 - 1.2 mg/dL - - -  Alkaline Phos 38 - 126 U/L - - -  AST 15 - 41 U/L - - -  ALT 0 - 44 U/L - - -    CBC Latest Ref Rng & Units 08/03/2019 08/01/2019 07/31/2019  WBC 4.0 - 10.5 K/uL 2.8(L) 2.3(L) 2.9(L)  Hemoglobin 13.0 - 17.0 g/dL 8.5(L) 8.0(L) 8.1(L)  Hematocrit 39.0 - 52.0 % 26.4(L) 24.5(L) 23.9(L)  Platelets 150 - 400 K/uL 343 306 303    ABG    Component Value Date/Time   PHART 7.299 (L) 07/27/2019 1655   PCO2ART 60.2 (H) 07/27/2019 1655   PO2ART 32.8 (LL) 07/27/2019 1655   HCO3 28.6 (H) 07/27/2019 1655   O2SAT 44.3 07/27/2019 1655     CBG (last 3)  Recent Labs    08/02/19 2303 08/03/19 0321 08/03/19 0816  GLUCAP 113* 57 73       Christinia Gully, MD Pulmonary and [place] [facility] [phone] After 6:00 PM or weekends, use Beeper [phone]  After 7:00 pm call Elink  [phone]

## 2019-08-04 ENCOUNTER — Ambulatory Visit
Admission: RE | Admit: 2019-08-04 | Discharge: 2019-08-04 | Disposition: A | Discharge status: Home / Self Care | Payer: Medicaid Other | Source: Ambulatory Visit | Attending: Radiation Oncology | Admitting: Radiation Oncology

## 2019-08-04 DIAGNOSIS — R0689 Other abnormalities of breathing: Secondary | ICD-10-CM

## 2019-08-04 LAB — GLUCOSE, CAPILLARY
Glucose-Capillary: 80 mg/dL (ref 70–99)
Glucose-Capillary: 73 mg/dL (ref 70–99)
Glucose-Capillary: 81 mg/dL (ref 70–99)
Glucose-Capillary: 117 mg/dL — ABNORMAL HIGH (ref 70–99)
Glucose-Capillary: 127 mg/dL — ABNORMAL HIGH (ref 70–99)

## 2019-08-04 MED ORDER — OSMOLITE 1.5 CAL PO LIQD
237.0000 mL | Freq: Every day | ORAL | Status: DC
  Administered 2019-08-04 – 2019-08-28 (×115): 237 mL
  Filled 2019-08-04 (×124): qty 237

## 2019-08-04 MED ORDER — PRO-STAT SUGAR FREE PO LIQD
30.0000 mL | Freq: Two times a day (BID) | ORAL | Status: DC
  Administered 2019-08-04 – 2019-08-28 (×46): 30 mL
  Filled 2019-08-04 (×45): qty 30

## 2019-08-04 MED ORDER — FREE WATER
120.0000 mL | Freq: Every day | Status: DC
  Administered 2019-08-04 – 2019-08-28 (×116): 120 mL

## 2019-08-04 NOTE — Procedures (Signed)
Tracheostomy Change Note  Patient Details:   Name: Devon Peterson DOB: [birth year] MRN: [id number]    Airway Documentation:     Evaluation  O2 sats: stable throughout; 672% Complications: No apparent complications Patient did tolerate procedure well. Bilateral Breath Sounds: Diminished, Rhonchi    Patient trach changed from #6 Shiley cuffed to #6 Shiley cuffless per PCCM order. Patient tolerated well; Color change noted with symmetrical BBS. No distress noted.     Lamonte Sakai 08/04/2019, 11:25 AM

## 2019-08-04 NOTE — Progress Notes (Signed)
Physical Therapy Treatment Patient Details Name: Devon Peterson MRN: [id number] DOB: 19-Dec-1955 Today's Date: 08/04/2019    History of Present Illness Significant Hospital Events 5/04 admitted5/05 seen by ENT as initial evaluation underwent flexible laryngoscopy5/06 to operating room for direct laryngoscopy and biopsy, and tracheostomy placement pulmonary asked to evaluate postop for hypotension5/11 G tube placed5/23 tracheostomy bleeding >> no interventions needs5/24 transfer to ICU for respiratory distress; placed on ventilator and changed to #4 cuffed trach (unable to pass #6 cuffed trach); ENT changed to #6 cuffed trach5/27 episode of a flutter >  Amiodarone boluses only 5/31: Off from mechanical ventilation for 48 hours6/1: Getting up with physical therapy    PT Comments    Pt in bed alert and following commands.  Unable to speak he uses hands gestures and a dry erase board.  Assisted OOB to recliner only due to Orthostatic Hypotension with dizziness.  General transfer comment: + 2 side by side assist no walker for quick transfer to recliner due to drop in BP.  Pt was able to bear his own weight briefly but present VERY WEAK/VERY DECONDITIONED  General Gait Details: unable to attempt due to drop in BP and B knees buckling with transfer  Supine       BP 106/73  HR 69  RR 17  sats 100% TRACH 28% at 5 LPM EOB           BP 67/47   HR 74  RR 22  sats 100% TRACH 28% 5 LPM Recliner    BP 94/62     HR 70  RR 24  sats  100% TRACH 28% 5 LPM Positioned in recliner with multiple pillows and blankets.  Pt c/o being cold  Follow Up Recommendations  SNF     Equipment Recommendations  None recommended by PT    Recommendations for Other Services       Precautions / Restrictions Precautions Precaution Comments: TRACH 28% (5 LPM), PEG Restrictions Weight Bearing Restrictions: No    Mobility  Bed Mobility Overal bed mobility: Needs Assistance Bed Mobility: Supine to Sit     Supine to sit: Mod  assist;+2 for physical assistance;+2 for safety/equipment     General bed mobility comments: increased time and assist with upper body as well as complete scooting to EOB due to fatigue/effort  Transfers Overall transfer level: Needs assistance Equipment used: 2 person hand held assist Transfers: Stand Pivot Transfers           General transfer comment: + 2 side by side assist no walker for quick transfer to recliner due to drop in BP.  Pt was able to bear his own weight briefly but present VERY WEAK/VERY DECONDITIONED  Ambulation/Gait             General Gait Details: unable to attempt due to drop in BP and B knees buckling with transfer   Stairs             Wheelchair Mobility    Modified Rankin (Stroke Patients Only)       Balance                                            Cognition  Exercises      General Comments        Pertinent Vitals/Pain      Home Living                      Prior Function            PT Goals (current goals can now be found in the care plan section) Progress towards PT goals: Progressing toward goals    Frequency    Min 2X/week      PT Plan Current plan remains appropriate;Discharge plan needs to be updated;Frequency needs to be updated    Co-evaluation              AM-PAC PT "6 Clicks" Mobility   Outcome Measure  Help needed turning from your back to your side while in a flat bed without using bedrails?: A Lot Help needed moving from lying on your back to sitting on the side of a flat bed without using bedrails?: A Lot Help needed moving to and from a bed to a chair (including a wheelchair)?: Total Help needed standing up from a chair using your arms (e.g., wheelchair or bedside chair)?: Total Help needed to walk in hospital room?: Total Help needed climbing 3-5 steps with a railing? : Total 6 Click Score:  8    End of Session Equipment Utilized During Treatment: Oxygen;Gait belt Activity Tolerance: Treatment limited secondary to medical complications (Comment) Patient left: in chair;with call bell/phone within reach Nurse Communication: Mobility status PT Visit Diagnosis: Muscle weakness (generalized) (M62.81);Difficulty in walking, not elsewhere classified (R26.2)     Time: 9379-0240 PT Time Calculation (min) (ACUTE ONLY): 25 min  Charges:  $Therapeutic Activity: 23-37 mins                     Rica Koyanagi  PTA Acute  Rehabilitation Services Pager      [phone] Office      [phone]

## 2019-08-04 NOTE — TOC Progression Note (Signed)
Transition of Care [facility]) - Progression Note    Patient Details  Name: Devon Peterson MRN: [id number] Date of Birth: 10-May-1955  [facility]) CM/SW Contact  Leeroy Cha, RN Phone Number: 08/04/2019, 9:04 AM  Clinical Narrative:    On trach collar at 28%,wcb 2.8,hgb 8.5 will follow for needs.will snf placment has had offer by [facility] within the past week.   Expected Discharge Plan: Skilled Nursing Facility Barriers to Discharge: Continued Medical Work up  Expected Discharge Plan and Services Expected Discharge Plan: [place] Acute Care Choice: [place]                                         Social Determinants of Health (SDOH) Interventions    Readmission Risk Interventions Readmission Risk Prevention Plan 07/27/2019  Transportation Screening Complete  HRI or [place] Complete  Social Work Consult for [place] Planning/Counseling Complete  Palliative Care Screening Complete  Medication Review [employer]) Complete  Some recent data might be hidden

## 2019-08-04 NOTE — Progress Notes (Signed)
Nutrition Follow-up  DOCUMENTATION CODES:   Severe malnutrition in context of chronic illness, Underweight  INTERVENTION:  - will adjust TF regimen: 1 carton (237 ml) Osmolite 1.5 x5/day with 30 ml prostat BID and 120 ml free water per TF bolus. - this regimen will provide 1975 kcal, 104 grams protein, and 1505 ml free water.    NUTRITION DIAGNOSIS:   Severe Malnutrition related to chronic illness(COPD) as evidenced by severe fat depletion, severe muscle depletion. -ongoing  GOAL:   Patient will meet greater than or equal to 90% of their needs -to be met with TF regimen  MONITOR:   TF tolerance, Diet advancement, Labs, Weight trends, Skin  ASSESSMENT:   64 year old male with past medical history of COPD, polysubstance abuse, recent admission 2/12-2/14 for sepsis secondary to pneumonia and COPD exacerbation presented with SOB, fatigue, and unintentional wt loss. Family reports patient has done poorly s/p returning home from hospital, has difficulty swallowing food, spitting up saliva, and has chronic cough. CXR revealed new left lower lobe airspace opacity concerning for pneumonia, CT angiogram showed abnormal soft tissue in hypopharynx suspicious for underlying mass, and dense bilateral lower lobe consolidation.  Significant Events: 5/4- admission 5/5- initial RD assessment 5/6- trach in OR 5/11- PEG placed in IR 5/12- TF initiation 5/20- first XRT treatment 5/21- changed from continuous to bolus TF 5/24- Rapid Response for tachypnea and labored respirations; intubated via trach 5/28- taken off of vent and placed back on trach collar   Patient remains NPO. Updated estimated nutrition needs. Patient was sitting in the chair with family member at bedside at the time of visit. Patient passively interacts with RD but does not verbalize or respond to questions in any way.  Able to talk with RN who reports no issues with TF. Informed her of plan to increase number of boluses of  TF/day.   He is currently receiving 1 carton (237 ml) Osmolite 1.5 TID with 30 ml prostat TID and 100 ml free water every 4 hours. This regimen is providing 1365 kcal, 90 grams protein, and 1143 ml free water.   Per notes, patient with squamous cell carcinoma of the larynx and plan to resume XRT today (6/1). Palliative Care is following--remains Full Code.    Labs reviewed; CBGs: 80 and 73 mg/dl, creatinine: 0.43 mg/dl, Ca: 8.6 mg/dl. Medications reviewed; 100 mg ascorbic acid per PEG BID, 100 mg colace per PEG BID, 300 mg ferrous sulfate per PEG BID, 40 mg protonix per PEG/day, 17 g miralax/day, 1 tablet senokot BID, 100 mg thiamine per PEG/day.   Diet Order:   Diet Order            Diet NPO time specified Except for: Ice Chips  Diet effective now              EDUCATION NEEDS:   No education needs have been identified at this time  Skin:  Skin Assessment: Skin Integrity Issues: Skin Integrity Issues:: Stage II, Incisions Stage II: mid sacrum Incisions: neck for trach (5/6)  Last BM:  5/31  Height:   Ht Readings from Last 1 Encounters:  07/27/19 '5\' 11"'$  (1.803 m)    Weight:   Wt Readings from Last 1 Encounters:  07/20/19 44 kg    Estimated Nutritional Needs:  Kcal:  1800-2050 kcal Protein:  80-100 Fluid:  >/= 1.8 L/day     Jarome Matin, MS, RD, LDN, CNSC Inpatient Clinical Dietitian RD pager # available in [facility]  After hours/weekend pager #  available in [facility]

## 2019-08-04 NOTE — Progress Notes (Signed)
NAME:  Devon Peterson, MRN:  [id number], DOB:  1956/02/17, LOS: 44 ADMISSION DATE:  07/07/2019, CONSULTATION DATE:  07/09/19  REFERRING MD:  Alfredia Ferguson, CHIEF COMPLAINT:  Post-operative hypotension   Brief History   64 yo male smoker admitted 5/04 with aspiration pneumonia and found to have hypopharyngeal mass.  Had direct laryngoscopy with biopsy and tracheostomy on 5/06.  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      Past Medical History  COPD, Cocaine abuse  Significant Hospital Events   5/04 admitted 5/05 seen by ENT as initial evaluation underwent flexible laryngoscopy 5/06 to operating room for direct laryngoscopy and biopsy, and tracheostomy placement pulmonary asked to evaluate postop for hypotension 5/11 G tube placed 5/23 tracheostomy bleeding >> no interventions needs 5/24 transfer to ICU for respiratory distress; placed on ventilator and changed to #4 cuffed trach (unable to pass #6 cuffed trach); ENT changed to #6 cuffed trach 5/27 episode of a flutter >  Amiodarone boluses only  5/31: Off from mechanical ventilation for 48 hours 6/1: Getting up with physical therapy Consults:  ENT Oncology Radiation oncology Palliative care  Procedures:  Tracheostomy 5/06 >> G tube 5/11 >>   Significant Diagnostic Tests:  CT angio chest 5/04 >> atherosclerosis, severe emphysema, b/l lower lobe consolidation Lt > Rt, 2.7 cm mass LLL CT neck 5/05 >> enhancing hypopharyngeal soft tissue in post cricoid region  Micro Data:  SARS CoV2 PCR 5/04 >> negative Blood 5/04 >> negative Sputum 5/24 >> no org seen > normal flora  Antimicrobials:  Unasyn 5/04 >> 5/11     Interim history/subjective:  No acute distress  Objective   Blood pressure 92/67, pulse 63, temperature 98.2 F (36.8 C), temperature source Oral, resp. rate 18, height 5\' 11"  (1.803 m), weight 44 kg, SpO2 100 %.    FiO2 (%):  [28 %] 28 %   Intake/Output Summary (Last 24 hours) at 08/04/2019 0842 Last data  filed at 08/04/2019 0400 Gross per 24 hour  Intake 537 ml  Output 725 ml  Net -188 ml   Filed Weights   07/07/19 2049 07/09/19 0949 07/20/19 1600  Weight: 42 kg 42 kg 44 kg    Examination:  General: This is a 64 year old black male currently lying in bed, getting ready for physical therapy.  He is currently in no acute distress HEENT normocephalic atraumatic no jugular venous distention is appreciated.  He has a size 6 cuffed trach in place.  Tracheostomy stoma is unremarkable. Pulmonary: Clear to auscultation no accessory use currently 28% Cardiac regular rate and rhythm Abdomen soft nontender Extremities are warm and dry without edema Neuro awake, appropriate.  Moves all extremities, diffusely weak GU clear yellow urine      Resolved problems:  Aspiration pneumonia Acute hypoxic respiratory failure Transient A fib/flutter new this admission. -maintaining  NSR -continue PRN lopressor for HR>115 Hypotension from hypovolemia. -resolved  Assessment & Plan:    Lurline Idol dependent 2/2 upper airway obstruction from squamous cessl carcinoma of the Larynx  Changed to #6 cuffed trach by ENT on 5/24 Tcollar since am 5/28 tol well > d/c vent 5/30  Plan Cont ATC Cont routine trach care Not candidate for decannulation Pulse ox  Mobilization  We can change him to cuffless tracheostomy  COPD with emphysema. Plan Cont supplemental oxygen Cont scheduled BD and ICS  Squamous cell carcinoma of larynx. Lt lower lung mass. Plan To resume radiation therapy 6/1 Additional rx per ONC   Goals of care. -  palliative  care input noted Plan Full code  Dysphagia. Cachexia. Severe Protein Calorie Malnutrition Plan Cont tube feeds  Anemia of critical illness, iron deficiency, and chronic disease. ->hgb stable Plan Cont iron supplementation     ETOH, cocaine abuse. Plan Cont thiamine and folate    Stage 2 sacral pressure ulcer. Present on admit Plan Cont wound  care  Depression. Plan Cont zoloft      Best practice:  Diet: bolus tube feeds DVT prophylaxis: PAS GI prophylaxis: protonix Mobility: bed rest Code Status: full code  Disposition step down  Labs:   CMP Latest Ref Rng & Units 08/03/2019 08/01/2019 07/31/2019  Glucose 70 - 99 mg/dL 89 75 76  BUN 8 - 23 mg/dL 19 17 11   Creatinine 0.61 - 1.24 mg/dL 0.43(L) 0.37(L) 0.43(L)  Sodium 135 - 145 mmol/L 137 131(L) 132(L)  Potassium 3.5 - 5.1 mmol/L 4.1 3.8 3.5  Chloride 98 - 111 mmol/L 99 98 100  CO2 22 - 32 mmol/L 28 25 21(L)  Calcium 8.9 - 10.3 mg/dL 8.6(L) 8.3(L) 8.6(L)  Total Protein 6.5 - 8.1 g/dL - - -  Total Bilirubin 0.3 - 1.2 mg/dL - - -  Alkaline Phos 38 - 126 U/L - - -  AST 15 - 41 U/L - - -  ALT 0 - 44 U/L - - -    CBC Latest Ref Rng & Units 08/03/2019 08/01/2019 07/31/2019  WBC 4.0 - 10.5 K/uL 2.8(L) 2.3(L) 2.9(L)  Hemoglobin 13.0 - 17.0 g/dL 8.5(L) 8.0(L) 8.1(L)  Hematocrit 39.0 - 52.0 % 26.4(L) 24.5(L) 23.9(L)  Platelets 150 - 400 K/uL 343 306 303    ABG    Component Value Date/Time   PHART 7.299 (L) 07/27/2019 1655   PCO2ART 60.2 (H) 07/27/2019 1655   PO2ART 32.8 (LL) 07/27/2019 1655   HCO3 28.6 (H) 07/27/2019 1655   O2SAT 44.3 07/27/2019 1655     CBG (last 3)  Recent Labs    08/03/19 2256 08/04/19 0329 08/04/19 [place] ACNP-BC [place] Pager # [phone] OR # [phone] if no answer

## 2019-08-05 ENCOUNTER — Ambulatory Visit
Admit: 2019-08-05 | Discharge: 2019-08-05 | Disposition: A | Discharge status: Home / Self Care | Payer: Medicaid Other | Attending: Radiation Oncology | Admitting: Radiation Oncology

## 2019-08-05 DIAGNOSIS — C329 Malignant neoplasm of larynx, unspecified: Secondary | ICD-10-CM

## 2019-08-05 LAB — GLUCOSE, CAPILLARY
Glucose-Capillary: 106 mg/dL — ABNORMAL HIGH (ref 70–99)
Glucose-Capillary: 131 mg/dL — ABNORMAL HIGH (ref 70–99)
Glucose-Capillary: 184 mg/dL — ABNORMAL HIGH (ref 70–99)
Glucose-Capillary: 67 mg/dL — ABNORMAL LOW (ref 70–99)
Glucose-Capillary: 75 mg/dL (ref 70–99)
Glucose-Capillary: 80 mg/dL (ref 70–99)
Glucose-Capillary: 94 mg/dL (ref 70–99)

## 2019-08-05 NOTE — Progress Notes (Signed)
OT Cancellation Note  Patient Details Name: Devon Peterson MRN: [id number] DOB: 1955/05/27   Cancelled Treatment:    Reason Eval/Treat Not Completed: Patient at procedure or test/ unavailable  Youlanda Tomassetti L Cezar Misiaszek 08/05/2019, 3:41 PM

## 2019-08-05 NOTE — Progress Notes (Signed)
I continue to follow Devon Peterson peripherally.  I have been following the chart and checking on him periodically.  He is completing radiation for the head and neck cancer.  My plan is to repeat a chest CT in 2-3 weeks to follow-up on the possible left lung mass.  I will be out until 08/10/2019.  I will check on him next week if he remains in the hospital.  Outpatient follow-up will be scheduled at the Cancer center.  Please call oncology as needed.

## 2019-08-05 NOTE — Progress Notes (Signed)
OT Cancellation Note  Patient Details Name: Devon Peterson MRN: [id number] DOB: Jul 24, 1955   Cancelled Treatment:    Reason Eval/Treat Not Completed: (Attempted to see patient again this afternoon. When entering room patient reports he needs to use the bedpan via writing. When asked if wanted to participate in therapy patient did not look at the therapist or answer the question. Will f/u as able.)  Jakylah Bassinger L Ryder [place] 08/05/2019, 4:31 PM

## 2019-08-05 NOTE — Progress Notes (Signed)
[name], male   DOB: 1955-09-20, 64 y.o.   MRN: [id number]  PROGRESS NOTE    Devon Peterson  [id number] DOB: [birth year] DOA: 07/07/2019 PCP: Patient, No Pcp Per   Brief Narrative:  64 year old male with history of COPD, cocaine abuse, tobacco abuse presented on Jul 07, 2019 with aspiration pneumonia and was found to have hypopharyngeal mass.  He had direct laryngoscopy with biopsy and tracheostomy on Jul 09, 2019.  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  He has had a prolonged hospitalization.  He had a G-tube placed on Jul 14, 2019.  He had some tracheostomy bleeding on Jul 26, 2019 which resolved without any intervention. On Jul 27, 2019, he was transferred to ICU for respiratory distress; placed on ventilator.  On Jul 30, 2019, he had episode of atrial flutter which responded to amiodarone boluses only.  Subsequently, he has been off of mechanical ventilation.  He was transferred back to [facility] service on 08/05/19  Assessment & Plan:   Acute hypoxic respiratory failure, currently trach dependent Aspiration pneumonia: Treated with antibiotics -Changed to #6 cuffed trach by ENT on Jul 27, 2019.  Patient tolerated trach collar since a.m. of Jul 31, 2019 and vent was discontinued on Aug 02, 2019. -Transferred back to [facility] service on 08/05/2019 -Trach care as per pulmonary. -Wean oxygen as able -Patient is not a candidate for decannulation because of laryngeal cancer  Squamous cell carcinoma of larynx Left lower lung mass -Currently undergoing radiation therapy.  Oncology following intermittently.  Outpatient follow-up with oncology  Dysphagia Cachexia Severe protein calorie malnutrition Generalized conditioning -Continue tube feeds via G-tube.  G-tube placed on 07/14/2019 -Overall prognosis is guarded to poor.  Patient remains full code.  Palliative care has evaluated patient as well during the hospitalization.  COPD with emphysema -Continue supplemental  oxygen.  Continue nebs  Anemia of critical illness, iron deficiency, chronic disease -Hemoglobin stable.  Paroxysmal/transient A. Fib/flutter -Had required amiodarone boluses briefly.  Currently rate controlled.  Continue metoprolol  Stage II sacral pressure ulcer -Present on admission -Continue wound care  Depression -Continue Zoloft   DVT prophylaxis: SCDs Code Status: Full Family Communication: Spoke to patient at bedside \ disposition Plan: Status is: Inpatient  Remains inpatient appropriate because: Will probably need LTAC placement versus SNF placement.  Currently still requiring tube feeding and has a trach requiring supplemental oxygen.   Dispo: The patient is from: Home              Anticipated d/c is to: SNF              Anticipated d/c date is: > 3 days              Patient currently is not medically stable to d/c.   Consultants: PCCM/ENT/oncology/palliative care/radiation oncology  Procedures:  Intubation Tracheostomy on 07/09/2019 G-tube on 07/14/2019  Antimicrobials:  Unasyn from 07/07/2019-07/14/2019  Subjective: Patient seen and examined at bedside.  He is a very poor historian, nods his head to some questions.  No overnight fever or vomiting reported.  Objective: Vitals:   08/05/19 1200 08/05/19 1300 08/05/19 1355 08/05/19 1400  BP: 95/67 101/73  99/64  Pulse:      Resp: '16 16  18  '$ Temp: 98.4 F (36.9 C)     TempSrc: Oral     SpO2:   100%   Weight:      Height:        Intake/Output Summary (Last 24 hours)  at 08/05/2019 1426 Last data filed at 08/05/2019 1334 Gross per 24 hour  Intake 250 ml  Output 1200 ml  Net -950 ml   Filed Weights   07/07/19 2049 07/09/19 0949 07/20/19 1600  Weight: 42 kg 42 kg 44 kg    Examination:  General exam: Appears calm and comfortable.  Looks older than stated age.  Very cachectic.  Chronically ill looking. ENT: Lurline Idol present. Respiratory system: Bilateral decreased breath sounds at bases Cardiovascular  system: S1 & S2 heard, Rate controlled Gastrointestinal system: Abdomen is nondistended, soft and nontender. Normal bowel sounds heard.  G-tube present. Extremities: No cyanosis, clubbing, edema  Central nervous system: Alert and oriented.  Poor historian.  Nods his head to some questions.  No focal neurological deficits. Moving extremities Skin: No rashes, lesions or ulcers Psychiatry: Flat affect  Data Reviewed: I have personally reviewed following labs and imaging studies  CBC: Recent Labs  Lab 07/29/19 2324 07/30/19 0259 07/31/19 0614 08/01/19 0400 08/03/19 0150  WBC  --  4.6 2.9* 2.3* 2.8*  HGB 9.1* 8.0* 8.1* 8.0* 8.5*  HCT 27.0* 24.2* 23.9* 24.5* 26.4*  MCV  --  85.8 83.9 86.6 88.0  PLT  --  249 303 306 035   Basic Metabolic Panel: Recent Labs  Lab 07/30/19 0259 07/31/19 0614 08/01/19 0400 08/03/19 0150  NA 132* 132* 131* 137  K 4.1 3.5 3.8 4.1  CL 98 100 98 99  CO2 24 21* 25 28  GLUCOSE 114* 76 75 89  BUN '14 11 17 19  '$ CREATININE 0.44* 0.43* 0.37* 0.43*  CALCIUM 8.6* 8.6* 8.3* 8.6*   GFR: Estimated Creatinine Clearance: 58.1 mL/min (A) (by C-G formula based on SCr of 0.43 mg/dL (L)). Liver Function Tests: No results for input(s): AST, ALT, ALKPHOS, BILITOT, PROT, ALBUMIN in the last 168 hours. No results for input(s): LIPASE, AMYLASE in the last 168 hours. No results for input(s): AMMONIA in the last 168 hours. Coagulation Profile: No results for input(s): INR, PROTIME in the last 168 hours. Cardiac Enzymes: No results for input(s): CKTOTAL, CKMB, CKMBINDEX, TROPONINI in the last 168 hours. BNP (last 3 results) No results for input(s): PROBNP in the last 8760 hours. HbA1C: No results for input(s): HGBA1C in the last 72 hours. CBG: Recent Labs  Lab 08/04/19 2007 08/05/19 0005 08/05/19 0407 08/05/19 0802 08/05/19 1157  GLUCAP 127* 80 75 67* 184*   Lipid Profile: No results for input(s): CHOL, HDL, LDLCALC, TRIG, CHOLHDL, LDLDIRECT in the last 72  hours. Thyroid Function Tests: No results for input(s): TSH, T4TOTAL, FREET4, T3FREE, THYROIDAB in the last 72 hours. Anemia Panel: No results for input(s): VITAMINB12, FOLATE, FERRITIN, TIBC, IRON, RETICCTPCT in the last 72 hours. Sepsis Labs: No results for input(s): PROCALCITON, LATICACIDVEN in the last 168 hours.  Recent Results (from the past 240 hour(s))  Culture, respiratory (non-expectorated)     Status: None   Collection Time: 07/27/19  3:41 PM   Specimen: Tracheal Aspirate; Respiratory  Result Value Ref Range Status   Specimen Description   Final    TRACHEAL ASPIRATE Performed at [facility] [address] [place], [place] [address]    Special Requests   Final    NONE Performed at [facility], [place] [address], [place], [place] [address]    Gram Stain   Final    ABUNDANT WBC PRESENT, PREDOMINANTLY PMN NO ORGANISMS SEEN    Culture   Final    RARE Consistent with normal respiratory flora. Performed  at [facility], [place] [address] [place], [place] [address]    Report Status 07/30/2019 FINAL  Final         Radiology Studies: No results found.      Scheduled Meds: . ascorbic acid  100 mg Per Tube BID  . chlorhexidine gluconate (MEDLINE KIT)  15 mL Mouth Rinse BID  . Chlorhexidine Gluconate Cloth  6 each Topical Daily  . docusate  100 mg Oral BID  . feeding supplement (OSMOLITE 1.5 CAL)  237 mL Per Tube 5 X Daily  . feeding supplement (PRO-STAT SUGAR FREE 64)  30 mL Per Tube BID  . ferrous sulfate  300 mg Per Tube BID WC  . free water  120 mL Per Tube 5 X Daily  . ipratropium  0.5 mg Nebulization TID  . levalbuterol  0.63 mg Nebulization TID  . mouth rinse  15 mL Mouth Rinse 10 times per day  . metoprolol tartrate  25 mg Per Tube BID  . pantoprazole sodium  40 mg Per Tube Q24H  . polyethylene glycol  17 g Oral Daily  . senna-docusate  1 tablet Oral BID  . sertraline  50 mg Per Tube Daily  . thiamine  100 mg  Per Tube Daily   Continuous Infusions:        Aline August, MD [facility] 08/05/2019, 2:26 PM

## 2019-08-06 ENCOUNTER — Ambulatory Visit
Admit: 2019-08-06 | Discharge: 2019-08-06 | Disposition: A | Discharge status: Home / Self Care | Payer: Medicaid Other | Attending: Radiation Oncology | Admitting: Radiation Oncology

## 2019-08-06 LAB — GLUCOSE, CAPILLARY
Glucose-Capillary: 87 mg/dL (ref 70–99)
Glucose-Capillary: 76 mg/dL (ref 70–99)
Glucose-Capillary: 77 mg/dL (ref 70–99)
Glucose-Capillary: 121 mg/dL — ABNORMAL HIGH (ref 70–99)

## 2019-08-06 LAB — CBC WITH DIFFERENTIAL/PLATELET
Abs Immature Granulocytes: 0.01 10*3/uL (ref 0.00–0.07)
Basophils Absolute: 0 10*3/uL (ref 0.0–0.1)
Basophils Relative: 1 %
Eosinophils Absolute: 0.1 10*3/uL (ref 0.0–0.5)
Eosinophils Relative: 3 %
HCT: 28.1 % — ABNORMAL LOW (ref 39.0–52.0)
Hemoglobin: 9.4 g/dL — ABNORMAL LOW (ref 13.0–17.0)
Immature Granulocytes: 0 %
Lymphocytes Relative: 28 %
Lymphs Abs: 0.9 10*3/uL (ref 0.7–4.0)
MCH: 29 pg (ref 26.0–34.0)
MCHC: 33.5 g/dL (ref 30.0–36.0)
MCV: 86.7 fL (ref 80.0–100.0)
Monocytes Absolute: 0.6 10*3/uL (ref 0.1–1.0)
Monocytes Relative: 18 %
Neutro Abs: 1.6 10*3/uL — ABNORMAL LOW (ref 1.7–7.7)
Neutrophils Relative %: 50 %
Platelets: 260 10*3/uL (ref 150–400)
RBC: 3.24 MIL/uL — ABNORMAL LOW (ref 4.22–5.81)
RDW: 15.1 % (ref 11.5–15.5)
WBC: 3.2 10*3/uL — ABNORMAL LOW (ref 4.0–10.5)
nRBC: 0 % (ref 0.0–0.2)

## 2019-08-06 LAB — BASIC METABOLIC PANEL
Anion gap: 9 (ref 5–15)
BUN: 17 mg/dL (ref 8–23)
CO2: 27 mmol/L (ref 22–32)
Calcium: 8.6 mg/dL — ABNORMAL LOW (ref 8.9–10.3)
Chloride: 97 mmol/L — ABNORMAL LOW (ref 98–111)
Creatinine, Ser: 0.46 mg/dL — ABNORMAL LOW (ref 0.61–1.24)
GFR calc Af Amer: >60 mL/min (ref >60)
GFR calc non Af Amer: >60 mL/min (ref >60)
Glucose, Bld: 97 mg/dL (ref 70–99)
Potassium: 4.6 mmol/L (ref 3.5–5.1)
Sodium: 133 mmol/L — ABNORMAL LOW (ref 135–145)

## 2019-08-06 LAB — MAGNESIUM: Magnesium: 1.5 mg/dL — ABNORMAL LOW (ref 1.7–2.4)

## 2019-08-06 MED ORDER — MAGNESIUM SULFATE 2 GM/50ML IV SOLN
2.0000 g | Freq: Once | INTRAVENOUS | Status: AC
  Administered 2019-08-06: 2 g via INTRAVENOUS
  Filled 2019-08-06: qty 50

## 2019-08-06 NOTE — Progress Notes (Signed)
OT Cancellation Note  Patient Details Name: Devon Peterson MRN: [id number] DOB: 1955-11-27   Cancelled Treatment:    Reason Eval/Treat Not Completed: Patient declined, no reason specified. Provided encouragement and education regarding importance of mobilizing with therapy. Nurse also present to provide encouragement however patient adamantly declining. Will check back as schedule permits.  Delbert Phenix OT Pager: [place] 08/06/2019, 10:00 AM

## 2019-08-06 NOTE — Progress Notes (Signed)
[name], male   DOB: 1955-04-11, 63 y.o.   MRN: [id number]  PROGRESS NOTE    Devon Peterson  [id number] DOB: [birth year] DOA: 07/07/2019 PCP: Patient, No Pcp Per   Brief Narrative:  64 year old male with history of COPD, cocaine abuse, tobacco abuse presented on Jul 07, 2019 with aspiration pneumonia and was found to have hypopharyngeal mass.  He had direct laryngoscopy with biopsy and tracheostomy on Jul 09, 2019.  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  He has had a prolonged hospitalization.  He had a G-tube placed on Jul 14, 2019.  He had some tracheostomy bleeding on Jul 26, 2019 which resolved without any intervention. On Jul 27, 2019, he was transferred to ICU for respiratory distress; placed on ventilator.  On Jul 30, 2019, he had episode of atrial flutter which responded to amiodarone boluses only.  Subsequently, he has been off of mechanical ventilation.  He was transferred back to [facility] service on 08/05/19  Assessment & Plan:   Acute hypoxic respiratory failure, currently trach dependent Aspiration pneumonia: Treated with antibiotics -Changed to #6 cuffed trach by ENT on Jul 27, 2019.  Patient tolerated trach collar since a.m. of Jul 31, 2019 and vent was discontinued on Aug 02, 2019. -Transferred back to [facility] service on 08/05/2019 -Trach care as per pulmonary. -Wean oxygen as able: Currently requiring 5 L oxygen -Patient is not a candidate for decannulation because of laryngeal cancer  Squamous cell carcinoma of larynx Left lower lung mass -Currently undergoing radiation therapy.  Oncology following intermittently.  Outpatient follow-up with oncology  Dysphagia Cachexia Severe protein calorie malnutrition Generalized conditioning -Continue tube feeds via G-tube.  G-tube placed on 07/14/2019 -Overall prognosis is guarded to poor.  Patient remains full code.  Palliative care has evaluated patient as well during the  hospitalization.  Hypomagnesemia--replace  COPD with emphysema -Continue supplemental oxygen.  Continue nebs  Anemia of critical illness, iron deficiency, chronic disease -Hemoglobin stable.  Paroxysmal/transient A. Fib/flutter -Had required amiodarone boluses briefly.  Currently rate controlled.  Continue metoprolol  Stage II sacral pressure ulcer -Present on admission -Continue wound care  Depression -Continue Zoloft   DVT prophylaxis: SCDs Code Status: Full Family Communication: Spoke to patient at bedside  disposition Plan: Status is: Inpatient  Remains inpatient appropriate because: Will probably need LTAC placement versus SNF placement.  Currently still requiring tube feeding and has a trach requiring supplemental oxygen.   Dispo: The patient is from: Home              Anticipated d/c is to: SNF              Anticipated d/c date is: > 3 days              Patient currently is not medically stable to d/c.   Consultants: PCCM/ENT/oncology/palliative care/radiation oncology  Procedures:  Intubation Tracheostomy on 07/09/2019 G-tube on 07/14/2019  Antimicrobials:  Unasyn from 07/07/2019-07/14/2019  Subjective: Patient seen and examined at bedside.  Poor historian.  Nods his head to some questions.  No worsening overnight shortness of breath, fever or vomiting reported. Objective: Vitals:   08/06/19 0304 08/06/19 0400 08/06/19 0500 08/06/19 0600  BP:  104/73 107/72 95/67  Pulse: 61 63 68 65  Resp: '16 15 16 16  '$ Temp:  98.1 F (36.7 C)    TempSrc:  Oral    SpO2: 100% 100% 100% 99%  Weight:      Height:  Intake/Output Summary (Last 24 hours) at 08/06/2019 0717 Last data filed at 08/06/2019 0600 Gross per 24 hour  Intake 507 ml  Output 1175 ml  Net -668 ml   Filed Weights   07/07/19 2049 07/09/19 0949 07/20/19 1600  Weight: 42 kg 42 kg 44 kg    Examination:  General exam: No acute distress.  Looks older than stated age.  Very cachectic.  Chronically  ill looking. ENT: Tracheostomy is present  respiratory system: Bilateral decreased breath sounds at bases with some scattered crackles Cardiovascular system: Rate controlled, S1-S2 heard Gastrointestinal system: Abdomen is nondistended, soft and nontender.  Bowel sounds are heard.  G-tube present. Extremities: No edema or cyanosis Central nervous system: Awake.  Poor historian.  Nods his head to some questions.  No focal neurological deficits. Moving extremities Skin: No other ulcers or lesions  psychiatry: Extremely flat affect  Data Reviewed: I have personally reviewed following labs and imaging studies  CBC: Recent Labs  Lab 07/31/19 0614 08/01/19 0400 08/03/19 0150 08/06/19 0231  WBC 2.9* 2.3* 2.8* 3.2*  NEUTROABS  --   --   --  1.6*  HGB 8.1* 8.0* 8.5* 9.4*  HCT 23.9* 24.5* 26.4* 28.1*  MCV 83.9 86.6 88.0 86.7  PLT 303 306 343 224   Basic Metabolic Panel: Recent Labs  Lab 07/31/19 0614 08/01/19 0400 08/03/19 0150 08/06/19 0231  NA 132* 131* 137 133*  K 3.5 3.8 4.1 4.6  CL 100 98 99 97*  CO2 21* '25 28 27  '$ GLUCOSE 76 75 89 97  BUN '11 17 19 17  '$ CREATININE 0.43* 0.37* 0.43* 0.46*  CALCIUM 8.6* 8.3* 8.6* 8.6*  MG  --   --   --  1.5*   GFR: Estimated Creatinine Clearance: 58.1 mL/min (A) (by C-G formula based on SCr of 0.46 mg/dL (L)). Liver Function Tests: No results for input(s): AST, ALT, ALKPHOS, BILITOT, PROT, ALBUMIN in the last 168 hours. No results for input(s): LIPASE, AMYLASE in the last 168 hours. No results for input(s): AMMONIA in the last 168 hours. Coagulation Profile: No results for input(s): INR, PROTIME in the last 168 hours. Cardiac Enzymes: No results for input(s): CKTOTAL, CKMB, CKMBINDEX, TROPONINI in the last 168 hours. BNP (last 3 results) No results for input(s): PROBNP in the last 8760 hours. HbA1C: No results for input(s): HGBA1C in the last 72 hours. CBG: Recent Labs  Lab 08/05/19 1157 08/05/19 1548 08/05/19 2012 08/05/19 2356  08/06/19 0439  GLUCAP 184* 94 131* 106* 87   Lipid Profile: No results for input(s): CHOL, HDL, LDLCALC, TRIG, CHOLHDL, LDLDIRECT in the last 72 hours. Thyroid Function Tests: No results for input(s): TSH, T4TOTAL, FREET4, T3FREE, THYROIDAB in the last 72 hours. Anemia Panel: No results for input(s): VITAMINB12, FOLATE, FERRITIN, TIBC, IRON, RETICCTPCT in the last 72 hours. Sepsis Labs: No results for input(s): PROCALCITON, LATICACIDVEN in the last 168 hours.  Recent Results (from the past 240 hour(s))  Culture, respiratory (non-expectorated)     Status: None   Collection Time: 07/27/19  3:41 PM   Specimen: Tracheal Aspirate; Respiratory  Result Value Ref Range Status   Specimen Description   Final    TRACHEAL ASPIRATE Performed at [facility] [address] [place], [place] [address]    Special Requests   Final    NONE Performed at [facility], [place] [address], [place], [place] [address]    Gram Stain   Final    ABUNDANT WBC PRESENT, PREDOMINANTLY PMN NO  ORGANISMS SEEN    Culture   Final    RARE Consistent with normal respiratory flora. Performed at [facility], [place] [address], [place], [place] [address]    Report Status 07/30/2019 FINAL  Final         Radiology Studies: No results found.      Scheduled Meds: . ascorbic acid  100 mg Per Tube BID  . chlorhexidine gluconate (MEDLINE KIT)  15 mL Mouth Rinse BID  . Chlorhexidine Gluconate Cloth  6 each Topical Daily  . docusate  100 mg Oral BID  . feeding supplement (OSMOLITE 1.5 CAL)  237 mL Per Tube 5 X Daily  . feeding supplement (PRO-STAT SUGAR FREE 64)  30 mL Per Tube BID  . ferrous sulfate  300 mg Per Tube BID WC  . free water  120 mL Per Tube 5 X Daily  . ipratropium  0.5 mg Nebulization TID  . levalbuterol  0.63 mg Nebulization TID  . mouth rinse  15 mL Mouth Rinse 10 times per day  . metoprolol tartrate  25 mg Per Tube BID  . pantoprazole sodium  40 mg  Per Tube Q24H  . polyethylene glycol  17 g Oral Daily  . senna-docusate  1 tablet Oral BID  . sertraline  50 mg Per Tube Daily  . thiamine  100 mg Per Tube Daily   Continuous Infusions:        Aline August, MD [facility] 08/06/2019, 7:17 AM

## 2019-08-07 ENCOUNTER — Ambulatory Visit
Admit: 2019-08-07 | Discharge: 2019-08-07 | Disposition: A | Discharge status: Home / Self Care | Payer: Medicaid Other | Attending: Radiation Oncology | Admitting: Radiation Oncology

## 2019-08-07 LAB — GLUCOSE, CAPILLARY
Glucose-Capillary: 89 mg/dL (ref 70–99)
Glucose-Capillary: 145 mg/dL — ABNORMAL HIGH (ref 70–99)
Glucose-Capillary: 91 mg/dL (ref 70–99)
Glucose-Capillary: 93 mg/dL (ref 70–99)
Glucose-Capillary: 121 mg/dL — ABNORMAL HIGH (ref 70–99)
Glucose-Capillary: 91 mg/dL (ref 70–99)
Glucose-Capillary: 152 mg/dL — ABNORMAL HIGH (ref 70–99)

## 2019-08-07 NOTE — Progress Notes (Addendum)
Patient ID: Ladarius Seubert, male   DOB: 01/30/1956, 64 y.o.   MRN: [id number]  PROGRESS NOTE    Kyuss Hale  [id number] DOB: 14-Oct-1955 DOA: 07/07/2019 PCP: Patient, No Pcp Per   Brief Narrative:  64 year old male with history of COPD, cocaine abuse, tobacco abuse presented on Jul 07, 2019 with aspiration pneumonia and was found to have hypopharyngeal mass.  He had direct laryngoscopy with biopsy and tracheostomy on Jul 09, 2019.  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  He has had a prolonged hospitalization.  He had a G-tube placed on Jul 14, 2019.  He had some tracheostomy bleeding on Jul 26, 2019 which resolved without any intervention. On Jul 27, 2019, he was transferred to ICU for respiratory distress; placed on ventilator.  On Jul 30, 2019, he had episode of atrial flutter which responded to amiodarone boluses only.  Subsequently, he has been off of mechanical ventilation.  He was transferred back to [facility] service on 08/05/19  Assessment & Plan:   Acute hypoxic respiratory failure, currently trach dependent Aspiration pneumonia: Treated with antibiotics -Changed to #6 cuffed trach by ENT on Jul 27, 2019.  Patient tolerated trach collar since a.m. of Jul 31, 2019 and vent was discontinued on Aug 02, 2019. -Transferred back to [facility] service on 08/05/2019 -Trach care as per pulmonary. -Wean oxygen as able: Currently still requiring 5 L oxygen -Patient is not a candidate for decannulation because of laryngeal cancer -Currently afebrile and hemodynamically stable.  Squamous cell carcinoma of larynx Left lower lung mass -Currently undergoing radiation therapy.  Oncology following intermittently.  Outpatient follow-up with oncology  Dysphagia Cachexia Severe protein calorie malnutrition Generalized conditioning -Continue tube feeds via G-tube.  G-tube placed on 07/14/2019 -Overall prognosis is guarded to poor.  Patient remains full code.  Palliative care has evaluated  patient as well during the hospitalization.  COPD with emphysema -Continue supplemental oxygen.  Continue nebs  Anemia of critical illness, iron deficiency, chronic disease -Hemoglobin stable.  Monitor intermittently.  Paroxysmal/transient A. Fib/flutter -Had required amiodarone boluses briefly.  Currently rate controlled.  Continue metoprolol  Stage II sacral pressure ulcer -Present on admission -Continue wound care  Depression -Continue Zoloft   DVT prophylaxis: SCDs Code Status: Full Family Communication: Spoke to patient at bedside  disposition Plan: Status is: Inpatient  Remains inpatient appropriate because: Will probably need LTAC placement versus SNF placement.  Currently still requiring tube feeding and has a trach requiring supplemental oxygen.  Currently stable for discharge to SNF if cleared by radiation oncology   Dispo: The patient is from: Home              Anticipated d/c is to: SNF              Anticipated d/c date is: Once cleared by radiation oncology              Patient currently is medically stable to d/c.   Consultants: PCCM/ENT/oncology/palliative care/radiation oncology  Procedures:  Intubation Tracheostomy on 07/09/2019 G-tube on 07/14/2019  Antimicrobials:  Unasyn from 07/07/2019-07/14/2019  Subjective: Patient seen and examined at bedside.  Poor historian.  No overnight worsening shortness of breath, chest pain or fever reported.  Nods his head to some questions. Objective: Vitals:   08/07/19 0335 08/07/19 0400 08/07/19 0500 08/07/19 0600  BP:  108/70 114/75 120/70  Pulse:      Resp:  _0 Temp: 98.3 F (36.8 C)     TempSrc: Oral  SpO2:      Weight:      Height:        Intake/Output Summary (Last 24 hours) at 08/07/2019 0721 Last data filed at 08/06/2019 2049 Gross per 24 hour  Intake 1665 ml  Output --  Net 1665 ml   Filed Weights   07/07/19 2049 07/09/19 0949 07/20/19 1600  Weight: 42 kg 42 kg 44 kg     Examination:  General exam: No acute distress.  Looks older than stated age.  Very cachectic.  Chronically ill looking.  Currently on 5 L oxygen via tracheostomy. ENT: Tracheostomy present.   Respiratory system: Bilateral decreased breath sounds at bases with some crackles.  No wheezing  cardiovascular system: Rate controlled, S1-2 heard gastrointestinal system: Abdomen is nondistended, soft and nontender.  Bowel sounds are heard.  G-tube present. Extremities: No lower extremity edema.  No cyanosis or clubbing  Central nervous system: Awake.  Extremely poor historian.  Nods his head to some questions.  No focal neurological deficits.  Moves extremities  skin: No other ulcers or lesions  psychiatry: Extremely flat affect  Data Reviewed: I have personally reviewed following labs and imaging studies  CBC: Recent Labs  Lab 08/01/19 0400 08/03/19 0150 08/06/19 0231  WBC 2.3* 2.8* 3.2*  NEUTROABS  --   --  1.6*  HGB 8.0* 8.5* 9.4*  HCT 24.5* 26.4* 28.1*  MCV 86.6 88.0 86.7  PLT 306 343 889   Basic Metabolic Panel: Recent Labs  Lab 08/01/19 0400 08/03/19 0150 08/06/19 0231  NA 131* 137 133*  K 3.8 4.1 4.6  CL 98 99 97*  CO2 _0 GLUCOSE 75 89 97  BUN _1 CREATININE 0.37* 0.43* 0.46*  CALCIUM 8.3* 8.6* 8.6*  MG  --   --  1.5*   GFR: Estimated Creatinine Clearance: 58.1 mL/min (A) (by C-G formula based on SCr of 0.46 mg/dL (L)). Liver Function Tests: No results for input(s): AST, ALT, ALKPHOS, BILITOT, PROT, ALBUMIN in the last 168 hours. No results for input(s): LIPASE, AMYLASE in the last 168 hours. No results for input(s): AMMONIA in the last 168 hours. Coagulation Profile: No results for input(s): INR, PROTIME in the last 168 hours. Cardiac Enzymes: No results for input(s): CKTOTAL, CKMB, CKMBINDEX, TROPONINI in the last 168 hours. BNP (last 3 results) No results for input(s): PROBNP in the last 8760 hours. HbA1C: No results for input(s): HGBA1C in  the last 72 hours. CBG: Recent Labs  Lab 08/05/19 2356 08/06/19 0439 08/06/19 0814 08/06/19 1126 08/06/19 1955  GLUCAP 106* 87 76 77 121*   Lipid Profile: No results for input(s): CHOL, HDL, LDLCALC, TRIG, CHOLHDL, LDLDIRECT in the last 72 hours. Thyroid Function Tests: No results for input(s): TSH, T4TOTAL, FREET4, T3FREE, THYROIDAB in the last 72 hours. Anemia Panel: No results for input(s): VITAMINB12, FOLATE, FERRITIN, TIBC, IRON, RETICCTPCT in the last 72 hours. Sepsis Labs: No results for input(s): PROCALCITON, LATICACIDVEN in the last 168 hours.  No results found for this or any previous visit (from the past 240 hour(s)).       Radiology Studies: No results found.      Scheduled Meds: . ascorbic acid  100 mg Per Tube BID  . chlorhexidine gluconate (MEDLINE KIT)  15 mL Mouth Rinse BID  . Chlorhexidine Gluconate Cloth  6 each Topical Daily  . docusate  100 mg Oral BID  . feeding supplement (OSMOLITE 1.5 CAL)  237 mL Per Tube 5 X Daily  .  feeding supplement (PRO-STAT SUGAR FREE 64)  30 mL Per Tube BID  . ferrous sulfate  300 mg Per Tube BID WC  . free water  120 mL Per Tube 5 X Daily  . ipratropium  0.5 mg Nebulization TID  . levalbuterol  0.63 mg Nebulization TID  . mouth rinse  15 mL Mouth Rinse 10 times per day  . metoprolol tartrate  25 mg Per Tube BID  . pantoprazole sodium  40 mg Per Tube Q24H  . polyethylene glycol  17 g Oral Daily  . senna-docusate  1 tablet Oral BID  . sertraline  50 mg Per Tube Daily  . thiamine  100 mg Per Tube Daily   Continuous Infusions:        Aline August, MD [facility] 08/07/2019, 7:21 AM

## 2019-08-07 NOTE — Progress Notes (Signed)
Physical Therapy Treatment Patient Details Name: Devon Peterson MRN: [id number] DOB: [birth year] Today's Date: 08/07/2019    History of Present Illness Significant Hospital Events 5/04 admitted5/05 seen by ENT as initial evaluation underwent flexible laryngoscopy5/06 to operating room for direct laryngoscopy and biopsy, and tracheostomy placement pulmonary asked to evaluate postop for hypotension5/11 G tube placed5/23 tracheostomy bleeding >> no interventions needs5/24 transfer to ICU for respiratory distress; placed on ventilator and changed to #4 cuffed trach (unable to pass #6 cuffed trach); ENT changed to #6 cuffed trach5/27 episode of a flutter >  Amiodarone boluses only 5/31: Off from mechanical ventilation for 48 hours6/1: Getting up with physical therapy    PT Comments    Pt agreeable to OOB and ambulated very short distance to sit up in recliner.  Pt denies dizziness but significant BP drop with transition - RN aware.   Follow Up Recommendations  SNF     Equipment Recommendations  None recommended by PT    Recommendations for Other Services       Precautions / Restrictions Precautions Precautions: Fall Precaution Comments: TRACH 28% (5 LPM), PEG Restrictions Weight Bearing Restrictions: No    Mobility  Bed Mobility Overal bed mobility: Needs Assistance Bed Mobility: Supine to Sit Rolling: Supervision         General bed mobility comments: increased time but no physical assist  Transfers Overall transfer level: Needs assistance Equipment used: Rolling walker (2 wheeled) Transfers: Sit to/from [facility] Sit to Stand: Min assist Stand pivot transfers: Min assist       General transfer comment: min assist to bring wt up and fwd and to balance in initial standing.  Ambulation/Gait Ambulation/Gait assistance: Min assist;+2 safety/equipment Gait Distance (Feet): 3 Feet Assistive device: Rolling walker (2 wheeled) Gait Pattern/deviations: Step-to  pattern;Trunk flexed Gait velocity: decr   General Gait Details: cues for posture and position from RW; distance lltd by fatigue.  Pt denies dizziness but BP on sitting in chair 69/53   Stairs             Wheelchair Mobility    Modified Rankin (Stroke Patients Only)       Balance Overall balance assessment: Needs assistance Sitting-balance support: No upper extremity supported;Feet supported Sitting balance-Leahy Scale: Good     Standing balance support: Bilateral upper extremity supported;During functional activity Standing balance-Leahy Scale: Poor Standing balance comment: reliant on RW                            Cognition Arousal/Alertness: Awake/alert Behavior During Therapy: Flat affect Overall Cognitive Status: Within Functional Limits for tasks assessed                                 General Comments: writes on board to communicate.      Exercises      General Comments        Pertinent Vitals/Pain Pain Assessment: No/denies pain    Home Living                      Prior Function            PT Goals (current goals can now be found in the care plan section) Acute Rehab PT Goals Patient Stated Goal: agreed to mobility PT Goal Formulation: With patient Time For Goal Achievement: 08/14/19 Potential to Achieve Goals: Fair Progress towards PT goals:  Progressing toward goals    Frequency    Min 2X/week      PT Plan Current plan remains appropriate    Co-evaluation              AM-PAC PT "6 Clicks" Mobility   Outcome Measure  Help needed turning from your back to your side while in a flat bed without using bedrails?: A Little Help needed moving from lying on your back to sitting on the side of a flat bed without using bedrails?: A Little Help needed moving to and from a bed to a chair (including a wheelchair)?: A Lot Help needed standing up from a chair using your arms (e.g., wheelchair or  bedside chair)?: A Little Help needed to walk in hospital room?: A Lot Help needed climbing 3-5 steps with a railing? : Total 6 Click Score: 14    End of Session Equipment Utilized During Treatment: Oxygen;Gait belt Activity Tolerance: Patient limited by fatigue Patient left: in chair;with call bell/phone within reach Nurse Communication: Mobility status PT Visit Diagnosis: Muscle weakness (generalized) (M62.81);Difficulty in walking, not elsewhere classified (R26.2)     Time: 7948-0165 PT Time Calculation (min) (ACUTE ONLY): 20 min  Charges:  $Therapeutic Activity: 8-22 mins                     [place] Pager [phone] Office [phone]    Janara Klett 08/07/2019, 3:19 PM

## 2019-08-07 NOTE — TOC Progression Note (Addendum)
Transition of Care [facility]) - Progression Note    Patient Details  Name: Devon Peterson MRN: [id number] Date of Birth: 05-24-1955  Transition of Care [facility]) CM/SW Contact  Leeroy Cha, RN Phone Number: 08/07/2019, 8:34 AM  Clinical Narrative:    Awaiting transition to bed on the 4 west progressive unit.  Trach being suctioned approx 3-4 hours,on 28% o2 via t.c.. awiating for patient to stablize enough to go to snf.  Medicaid has no ltac benefits.  Expected Discharge Plan: Skilled Nursing Facility Barriers to Discharge: Continued Medical Work up  Expected Discharge Plan and Services Expected Discharge Plan: [place] Acute Care Choice: [place]                                         Social Determinants of Health (SDOH) Interventions    Readmission Risk Interventions Readmission Risk Prevention Plan 07/27/2019  Transportation Screening Complete  HRI or [place] Complete  Social Work Consult for [place] Planning/Counseling Complete  Palliative Care Screening Complete  Medication Review [employer]) Complete  Some recent data might be hidden

## 2019-08-08 LAB — CBC
HCT: 28.7 % — ABNORMAL LOW (ref 39.0–52.0)
Hemoglobin: 9.2 g/dL — ABNORMAL LOW (ref 13.0–17.0)
MCH: 28.3 pg (ref 26.0–34.0)
MCHC: 32.1 g/dL (ref 30.0–36.0)
MCV: 88.3 fL (ref 80.0–100.0)
Platelets: 319 10*3/uL (ref 150–400)
RBC: 3.25 MIL/uL — ABNORMAL LOW (ref 4.22–5.81)
RDW: 15.1 % (ref 11.5–15.5)
WBC: 3.9 10*3/uL — ABNORMAL LOW (ref 4.0–10.5)
nRBC: 0 % (ref 0.0–0.2)

## 2019-08-08 LAB — GLUCOSE, CAPILLARY
Glucose-Capillary: 103 mg/dL — ABNORMAL HIGH (ref 70–99)
Glucose-Capillary: 120 mg/dL — ABNORMAL HIGH (ref 70–99)
Glucose-Capillary: 143 mg/dL — ABNORMAL HIGH (ref 70–99)
Glucose-Capillary: 75 mg/dL (ref 70–99)
Glucose-Capillary: 78 mg/dL (ref 70–99)
Glucose-Capillary: 90 mg/dL (ref 70–99)

## 2019-08-08 LAB — BASIC METABOLIC PANEL
Anion gap: 7 (ref 5–15)
BUN: 20 mg/dL (ref 8–23)
CO2: 34 mmol/L — ABNORMAL HIGH (ref 22–32)
Calcium: 9.1 mg/dL (ref 8.9–10.3)
Chloride: 94 mmol/L — ABNORMAL LOW (ref 98–111)
Creatinine, Ser: 0.37 mg/dL — ABNORMAL LOW (ref 0.61–1.24)
GFR calc Af Amer: >60 mL/min (ref >60)
GFR calc non Af Amer: >60 mL/min (ref >60)
Glucose, Bld: 88 mg/dL (ref 70–99)
Potassium: 4.4 mmol/L (ref 3.5–5.1)
Sodium: 135 mmol/L (ref 135–145)

## 2019-08-08 MED ORDER — CHLORHEXIDINE GLUCONATE 0.12 % MT SOLN
OROMUCOSAL | Status: AC
  Filled 2019-08-08: qty 15

## 2019-08-08 NOTE — Progress Notes (Signed)
Patient continues to switch between yellow and green mews due to high RR and soft BP. MD aware. Will continue Q4vitals.

## 2019-08-08 NOTE — Progress Notes (Signed)
[name], male   DOB: 04-Jun-1955, 64 y.o.   MRN: [id number]  PROGRESS NOTE    Devon Peterson  [id number] DOB: [birth year] DOA: 07/07/2019 PCP: Patient, No Pcp Per   Brief Narrative:  64 year old male with history of COPD, cocaine abuse, tobacco abuse presented on Jul 07, 2019 with aspiration pneumonia and was found to have hypopharyngeal mass.  He had direct laryngoscopy with biopsy and tracheostomy on Jul 09, 2019.  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  He has had a prolonged hospitalization.  He had a G-tube placed on Jul 14, 2019.  He had some tracheostomy bleeding on Jul 26, 2019 which resolved without any intervention. On Jul 27, 2019, he was transferred to ICU for respiratory distress; placed on ventilator.  On Jul 30, 2019, he had episode of atrial flutter which responded to amiodarone boluses only.  Subsequently, he has been off of mechanical ventilation.  He was transferred back to [facility] service on 08/05/19  Assessment & Plan:   Acute hypoxic respiratory failure, currently trach dependent Aspiration pneumonia: Treated with antibiotics -Changed to #6 cuffed trach by ENT on Jul 27, 2019.  Patient tolerated trach collar since a.m. of Jul 31, 2019 and vent was discontinued on Aug 02, 2019. -Transferred back to [facility] Inpatient service on 08/05/2019 -Trach care as per pulmonary. 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  Oncology following intermittently.  Outpatient follow-up with oncology  Dysphagia Cachexia Severe protein calorie malnutrition Generalized conditioning -Continue tube feeds via G-tube.  G-tube placed on 07/14/2019 -Overall prognosis is guarded to poor.  Patient remains full code.  Palliative care has evaluated patient as well  during the hospitalization.  COPD with emphysema -Continue supplemental oxygen.  Continue nebs  Anemia of critical illness, iron deficiency, chronic disease -Hemoglobin stable.  Monitor intermittently.  Paroxysmal/transient A. Fib/flutter -Had required amiodarone boluses briefly.  Currently rate controlled.  Continue metoprolol  Stage II sacral pressure ulcer -Present on admission -Continue wound care  Depression -Continue Zoloft   DVT prophylaxis: SCDs Code Status: Full Family Communication: Spoke to patient at bedside  disposition Plan: Status is: Inpatient  Remains inpatient appropriate because: Will probably need LTAC placement versus SNF placement.  Currently still requiring tube feeding and has a trach requiring supplemental oxygen.  Currently stable for discharge to SNF if cleared by radiation oncology   Dispo: The patient is from: Home              Anticipated d/c is to: SNF              Anticipated d/c date is: Once cleared by radiation oncology              Patient currently is medically stable to d/c.   Consultants: PCCM/ENT/oncology/palliative care/radiation oncology  Procedures:  Intubation Tracheostomy on 07/09/2019 G-tube on 07/14/2019  Antimicrobials:  Unasyn from 07/07/2019-07/14/2019  Subjective: Patient seen and examined at bedside.  Poor historian.  Nonspecific to some questions.  No overnight fever or vomiting reported  objective: Vitals:   08/08/19 0500 08/08/19 0840 08/08/19 0844 08/08/19 0958  BP: 104/73   109/74  Pulse:  69  76  Resp: 15 (!) 24    Temp:  (P) 97.7 F (36.5 C)    TempSrc:  (P) Axillary    SpO2:  97% 100%   Weight:      Height:        Intake/Output Summary (Last 24 hours) at 08/08/2019 1008 Last data filed at 08/08/2019 0500 Gross per 24 hour  Intake 774 ml  Output 1000 ml  Net -226 ml   Filed Weights   07/07/19 2049 07/09/19 0949 07/20/19 1600  Weight: 42 kg 42 kg 44 kg    Examination:  General exam: No distress.   Looks older than stated age.  Very cachectic.  Chronically ill looking.  Currently on 5 L oxygen via tracheostomy. ENT: Lurline Idol present Respiratory system: Bilateral decreased breath sounds at bases with scattered crackles.  Intermittently tachypneic  cardiovascular system: S1-S2 heard, rate controlled gastrointestinal system: Abdomen is nondistended, soft and nontender.  Normal bowel sounds are heard.  G-tube present. Extremities: No cyanosis, clubbing or edema. Central nervous system: Awake.  Extremely poor historian.  Nods his head to some questions.  No focal neurological deficits.  Moving extremities skin: No other ulcers or lesions  psychiatry: Flat affect   data Reviewed: I have personally reviewed following labs and imaging studies  CBC: Recent Labs  Lab 08/03/19 0150 08/06/19 0231 08/08/19 0300  WBC 2.8* 3.2* 3.9*  NEUTROABS  --  1.6*  --   HGB 8.5* 9.4* 9.2*  HCT 26.4* 28.1* 28.7*  MCV 88.0 86.7 88.3  PLT 343 260 937   Basic Metabolic Panel: Recent Labs  Lab 08/03/19 0150 08/06/19 0231 08/08/19 0300  NA 137 133* 135  K 4.1 4.6 4.4  CL 99 97* 94*  CO2 28 27 34*  GLUCOSE 89 97 88  BUN _0 CREATININE 0.43* 0.46* 0.37*  CALCIUM 8.6* 8.6* 9.1  MG  --  1.5*  --    GFR: Estimated Creatinine Clearance: 58.1 mL/min (A) (by C-G formula based on SCr of 0.37 mg/dL (L)). Liver Function Tests: No results for input(s): AST, ALT, ALKPHOS, BILITOT, PROT, ALBUMIN in the last 168 hours. No results for input(s): LIPASE, AMYLASE in the last 168 hours. No results for input(s): AMMONIA in the last 168 hours. Coagulation Profile: No results for input(s): INR, PROTIME in the last 168 hours. Cardiac Enzymes: No results for input(s): CKTOTAL, CKMB, CKMBINDEX, TROPONINI in the last 168 hours. BNP (last 3 results) No results for input(s): PROBNP in the last 8760 hours. HbA1C: No results for input(s): HGBA1C in the last 72 hours. CBG: Recent Labs  Lab 08/07/19 1722  08/07/19 1930 08/08/19 0006 08/08/19 0408 08/08/19 0808  GLUCAP 91 152* 90 78 75   Lipid Profile: No results for input(s): CHOL, HDL, LDLCALC, TRIG, CHOLHDL, LDLDIRECT in the last 72 hours. Thyroid Function Tests: No results for input(s): TSH, T4TOTAL, FREET4, T3FREE, THYROIDAB in the last 72 hours. Anemia Panel: No results for input(s): VITAMINB12, FOLATE, FERRITIN, TIBC, IRON, RETICCTPCT in the last 72 hours. Sepsis Labs: No results for input(s): PROCALCITON, LATICACIDVEN in the last 168 hours.  No results found for this or any previous visit (from the past 240 hour(s)).       Radiology Studies: No results found.      Scheduled Meds: . ascorbic acid  100 mg Per Tube BID  . chlorhexidine gluconate (MEDLINE KIT)  15 mL Mouth Rinse BID  . Chlorhexidine Gluconate Cloth  6 each Topical Daily  . docusate  100 mg Oral BID  . feeding supplement (OSMOLITE 1.5 CAL)  237 mL Per Tube 5 X Daily  . feeding supplement (PRO-STAT SUGAR FREE 64)  30 mL  Per Tube BID  . ferrous sulfate  300 mg Per Tube BID WC  . free water  120 mL Per Tube 5 X Daily  . ipratropium  0.5 mg Nebulization TID  . levalbuterol  0.63 mg Nebulization TID  . mouth rinse  15 mL Mouth Rinse 10 times per day  . metoprolol tartrate  25 mg Per Tube BID  . pantoprazole sodium  40 mg Per Tube Q24H  . polyethylene glycol  17 g Oral Daily  . senna-docusate  1 tablet Oral BID  . sertraline  50 mg Per Tube Daily  . thiamine  100 mg Per Tube Daily   Continuous Infusions:        Aline August, MD [facility] 08/08/2019, 10:08 AM

## 2019-08-09 LAB — GLUCOSE, CAPILLARY
Glucose-Capillary: 123 mg/dL — ABNORMAL HIGH (ref 70–99)
Glucose-Capillary: 140 mg/dL — ABNORMAL HIGH (ref 70–99)
Glucose-Capillary: 77 mg/dL (ref 70–99)
Glucose-Capillary: 81 mg/dL (ref 70–99)
Glucose-Capillary: 85 mg/dL (ref 70–99)
Glucose-Capillary: 94 mg/dL (ref 70–99)

## 2019-08-09 NOTE — Progress Notes (Signed)
[name], male   DOB: 04-30-1955, 64 y.o.   MRN: [id number]  PROGRESS NOTE    Devon Peterson  [id number] DOB: [birth year] DOA: 07/07/2019 PCP: Patient, No Pcp Per   Brief Narrative:  64 year old male with history of COPD, cocaine abuse, tobacco abuse presented on Jul 07, 2019 with aspiration pneumonia and was found to have hypopharyngeal mass.  He had direct laryngoscopy with biopsy and tracheostomy on Jul 09, 2019.  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  He has had a prolonged hospitalization.  He had a G-tube placed on Jul 14, 2019.  He had some tracheostomy bleeding on Jul 26, 2019 which resolved without any intervention. On Jul 27, 2019, he was transferred to ICU for respiratory distress; placed on ventilator.  On Jul 30, 2019, he had episode of atrial flutter which responded to amiodarone boluses only.  Subsequently, he has been off of mechanical ventilation.  He was transferred back to [facility] service on 08/05/19  Assessment & Plan:   Acute hypoxic respiratory failure, currently trach dependent Aspiration pneumonia: Treated with antibiotics -Changed to #6 cuffed trach by ENT on Jul 27, 2019.  Patient tolerated trach collar since a.m. of Jul 31, 2019 and vent was discontinued on Aug 02, 2019. -Transferred back to [facility] service on 08/05/2019 -Trach care as per pulmonary. 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  Oncology following intermittently.  Outpatient follow-up with oncology  Dysphagia Cachexia Severe protein calorie malnutrition Generalized conditioning -Continue tube feeds via G-tube.  G-tube placed on 07/14/2019 -Overall prognosis is guarded to poor.  Patient remains full code.  Palliative care has evaluated  patient as well during the hospitalization.  COPD with emphysema -Continue supplemental oxygen.  Continue nebs  Anemia of critical illness, iron deficiency, chronic disease -Hemoglobin stable.  Monitor intermittently.  Paroxysmal/transient A. Fib/flutter -Had required amiodarone boluses briefly.  Currently rate controlled.  Continue metoprolol  Stage II sacral pressure ulcer -Present on admission -Continue wound care  Depression -Continue Zoloft   DVT prophylaxis: SCDs Code Status: Full Family Communication: Spoke to patient at bedside  disposition Plan: Status is: Inpatient  Remains inpatient appropriate because: Will probably need LTAC placement versus SNF placement.  Currently still requiring tube feeding and has a trach requiring supplemental oxygen.  Currently stable for discharge to SNF if cleared by radiation oncology.  Social worker following   Dispo: The patient is from: Home              Anticipated d/c is to: SNF              Anticipated d/c date is: Once cleared by radiation oncology              Patient currently is medically stable to d/c.   Consultants: PCCM/ENT/oncology/palliative care/radiation oncology  Procedures:  Intubation Tracheostomy on 07/09/2019 G-tube on 07/14/2019  Antimicrobials:  Unasyn from 07/07/2019-07/14/2019  Subjective: Patient seen and examined at bedside.  Poor historian.  Not sensitive to questions.  No overnight worsening shortness of breath, fever or vomiting reported.   Objective: Vitals:   08/09/19 0200 08/09/19 0300 08/09/19 0500 08/09/19 0600  BP: 94/70 100/75 101/74 99/75  Pulse:      Resp: _0 Temp:      TempSrc:  SpO2:      Weight:      Height:        Intake/Output Summary (Last 24 hours) at 08/09/2019 0747 Last data filed at 08/09/2019 0000 Gross per 24 hour  Intake 1440 ml  Output 1875 ml  Net -435 ml   Filed Weights   07/07/19 2049 07/09/19 0949 07/20/19 1600  Weight: 42 kg 42 kg 44 kg     Examination:  General exam: No acute distress.  Looks older than stated age.  Very cachectic.  Chronically ill looking.  Currently still on 5 L oxygen via tracheostomy. ENT: Tracheostomy is present Respiratory system: Bilateral decreased breath sounds at bases with some crackles.  Intermittent tachypnea present  cardiovascular system: Rate controlled, S1-S2 heard  gastrointestinal system: Abdomen is nondistended, soft and nontender.  Bowel sounds heard.  G-tube present. Extremities: No clubbing or edema Central nervous system: Awake.  Extremely poor historian.  Nods his head to some questions.  No obvious focal neurological deficits.  Moves extremities skin: No other petechiae/ecchymosis psychiatry: Extremely flat affect  data Reviewed: I have personally reviewed following labs and imaging studies  CBC: Recent Labs  Lab 08/03/19 0150 08/06/19 0231 08/08/19 0300  WBC 2.8* 3.2* 3.9*  NEUTROABS  --  1.6*  --   HGB 8.5* 9.4* 9.2*  HCT 26.4* 28.1* 28.7*  MCV 88.0 86.7 88.3  PLT 343 260 939   Basic Metabolic Panel: Recent Labs  Lab 08/03/19 0150 08/06/19 0231 08/08/19 0300  NA 137 133* 135  K 4.1 4.6 4.4  CL 99 97* 94*  CO2 28 27 34*  GLUCOSE 89 97 88  BUN _0 CREATININE 0.43* 0.46* 0.37*  CALCIUM 8.6* 8.6* 9.1  MG  --  1.5*  --    GFR: Estimated Creatinine Clearance: 58.1 mL/min (A) (by C-G formula based on SCr of 0.37 mg/dL (L)). Liver Function Tests: No results for input(s): AST, ALT, ALKPHOS, BILITOT, PROT, ALBUMIN in the last 168 hours. No results for input(s): LIPASE, AMYLASE in the last 168 hours. No results for input(s): AMMONIA in the last 168 hours. Coagulation Profile: No results for input(s): INR, PROTIME in the last 168 hours. Cardiac Enzymes: No results for input(s): CKTOTAL, CKMB, CKMBINDEX, TROPONINI in the last 168 hours. BNP (last 3 results) No results for input(s): PROBNP in the last 8760 hours. HbA1C: No results for input(s): HGBA1C in  the last 72 hours. CBG: Recent Labs  Lab 08/08/19 1220 08/08/19 1629 08/08/19 2038 08/09/19 0007 08/09/19 0437  GLUCAP 143* 120* 103* 94 77   Lipid Profile: No results for input(s): CHOL, HDL, LDLCALC, TRIG, CHOLHDL, LDLDIRECT in the last 72 hours. Thyroid Function Tests: No results for input(s): TSH, T4TOTAL, FREET4, T3FREE, THYROIDAB in the last 72 hours. Anemia Panel: No results for input(s): VITAMINB12, FOLATE, FERRITIN, TIBC, IRON, RETICCTPCT in the last 72 hours. Sepsis Labs: No results for input(s): PROCALCITON, LATICACIDVEN in the last 168 hours.  No results found for this or any previous visit (from the past 240 hour(s)).       Radiology Studies: No results found.      Scheduled Meds: . ascorbic acid  100 mg Per Tube BID  . chlorhexidine gluconate (MEDLINE KIT)  15 mL Mouth Rinse BID  . Chlorhexidine Gluconate Cloth  6 each Topical Daily  . docusate  100 mg Oral BID  . feeding supplement (OSMOLITE 1.5 CAL)  237 mL Per Tube 5 X Daily  . feeding supplement (PRO-STAT SUGAR FREE 64)  30 mL Per Tube BID  . ferrous sulfate  300 mg Per Tube BID WC  . free water  120 mL Per Tube 5 X Daily  . ipratropium  0.5 mg Nebulization TID  . levalbuterol  0.63 mg Nebulization TID  . mouth rinse  15 mL Mouth Rinse 10 times per day  . metoprolol tartrate  25 mg Per Tube BID  . pantoprazole sodium  40 mg Per Tube Q24H  . polyethylene glycol  17 g Oral Daily  . senna-docusate  1 tablet Oral BID  . sertraline  50 mg Per Tube Daily  . thiamine  100 mg Per Tube Daily   Continuous Infusions:        Aline August, MD [facility] 08/09/2019, 7:47 AM

## 2019-08-09 NOTE — Progress Notes (Addendum)
Occupational Therapy Treatment Patient Details Name: Devon Peterson MRN: [id number] DOB: 27-Apr-1955 Today's Date: 08/09/2019    History of present illness Significant Hospital Events 5/04 admitted5/05 seen by ENT as initial evaluation underwent flexible laryngoscopy5/06 to operating room for direct laryngoscopy and biopsy, and tracheostomy placement pulmonary asked to evaluate postop for hypotension5/11 G tube placed5/23 tracheostomy bleeding >> no interventions needs5/24 transfer to ICU for respiratory distress; placed on ventilator and changed to #4 cuffed trach (unable to pass #6 cuffed trach); ENT changed to #6 cuffed trach5/27 episode of a flutter >  Amiodarone boluses only 5/31: Off from mechanical ventilation for 48 hours6/1: Getting up with physical therapy   OT comments  Treatment focused on improving patient's activity tolerance needed for mobility and ADLs. Patient currently limited by orthostatic hypotension. Patient min assist to transfer to side of bed and min guard with RW for standing.. Blood pressures as follows:  BP sitting: 70/58 (not symptomatic) BP sitting 2 min: 72/58 (not symptomatic) BP sitting 4 min:  86/49 (not symptomatic) Standing: 50/41.   Patient does not reports symptoms during standing and after being supine nodded yes when therapist asked him if he had became dizzy. But tolerated standing less than 10 seconds and returned to sitting and  began to lean on his right elbow. Patient returned to supine. Rn in room to assist. Rn suctioned patient. Patient required pericare as patient found to have had bowel movement with standing and bed linen change with assistance of two. Cont POC     Follow Up Recommendations  LTACH;SNF    Equipment Recommendations  Other (comment)    Recommendations for Other Services      Precautions / Restrictions Precautions Precautions: Fall Precaution Comments: TRACH 28% (5 LPM), PEG, orthostatic Restrictions Weight Bearing  Restrictions: No       Mobility Bed Mobility   Bed Mobility: Supine to Sit Rolling: Supervision   Supine to sit: Min assist     General bed mobility comments: Min assist for hand hold to transfer to side of bed. Patient able to roll left and right for pericare/bed linen change with use of bed rails.  Transfers                 General transfer comment: Min guard to stand at side of bed - patient holding onto walker. Patient limited by orhostatic BP.    Balance                                           ADL either performed or assessed with clinical judgement   ADL       Grooming: Wash/dry face;Set up;Sitting Grooming Details (indicate cue type and reason): sitting at side of bed             Lower Body Dressing: Minimal assistance Lower Body Dressing Details (indicate cue type and reason): Able to slide feet into slippers with therapist stabilizing slippers sitting at side of bed.     Toileting- Clothing Manipulation and Hygiene: Total assistance;+2 for physical assistance Toileting - Clothing Manipulation Details (indicate cue type and reason): With standing patinet found to have had BM. +2 assistance for pericare, management of lines and leads, changing hospital gown and bed mobility.             Vision       Perception     Praxis  Cognition                                                Exercises     Shoulder Instructions       General Comments      Pertinent Vitals/ Pain          Home Living                                          Prior Functioning/Environment              Frequency  Min 2X/week        Progress Toward Goals  OT Goals(current goals can now be found in the care plan section)  Progress towards OT goals: Progressing toward goals(limited by patient's poor participation and currently poor activity tolerance secondary orthostatic BP)  Acute Rehab  OT Goals Patient Stated Goal: none stated, but nods yes when therapist asks if he wants to become more independent OT Goal Formulation: Patient unable to participate in goal setting Time For Goal Achievement: 08/23/19 Potential to Achieve Goals: [place] Discharge plan remains appropriate;Frequency remains appropriate    Co-evaluation          OT goals addressed during session: Other (comment)(activity tolerance needed for functional tasks)      AM-PAC OT "6 Clicks" Daily Activity     Outcome Measure   Help from another person eating meals?: None(NPO - but patient able to bring hand to mouth and perform suctioning) Help from another person taking care of personal grooming?: A Little Help from another person toileting, which includes using toliet, bedpan, or urinal?: Total Help from another person bathing (including washing, rinsing, drying)?: Total(poor participation in ADLs.) Help from another person to put on and taking off regular upper body clothing?: Total Help from another person to put on and taking off regular lower body clothing?: Total 6 Click Score: 11    End of Session Equipment Utilized During Treatment: Rolling walker  OT Visit Diagnosis: Unsteadiness on feet (R26.81);Other abnormalities of gait and mobility (R26.89);Muscle weakness (generalized) (M62.81)   Activity Tolerance Treatment limited secondary to medical complications (Comment);Patient limited by fatigue(orthostatic BP)   Patient Left in bed;with nursing/sitter in room   Nurse Communication (BP)        Time: 7741-2878 OT Time Calculation (min): 24 min  Charges: OT General Charges $OT Visit: 1 Visit OT Treatments $Self Care/Home Management : 8-22 mins $Therapeutic Activity: 8-22 mins  Derl Barrow, OTR/L [place]  Office [phone]    Lenward Chancellor 08/09/2019, 1:53 PM

## 2019-08-09 NOTE — Progress Notes (Signed)
Devon Peterson continues to interchange between a green and yellow mews related to his respirations and BP. Q4hr vitals continued and MD is aware.

## 2019-08-10 ENCOUNTER — Ambulatory Visit
Admit: 2019-08-10 | Discharge: 2019-08-10 | Disposition: A | Discharge status: Home / Self Care | Payer: Medicaid Other | Attending: Radiation Oncology | Admitting: Radiation Oncology

## 2019-08-10 LAB — GLUCOSE, CAPILLARY
Glucose-Capillary: 129 mg/dL — ABNORMAL HIGH (ref 70–99)
Glucose-Capillary: 77 mg/dL (ref 70–99)
Glucose-Capillary: 83 mg/dL (ref 70–99)
Glucose-Capillary: 91 mg/dL (ref 70–99)
Glucose-Capillary: 91 mg/dL (ref 70–99)
Glucose-Capillary: 98 mg/dL (ref 70–99)

## 2019-08-10 NOTE — Progress Notes (Signed)
[name], male   DOB: [birth year], 64 y.o.   MRN: [id number]  PROGRESS NOTE    Devon Peterson  [id number] DOB: 1955-09-29 DOA: 07/07/2019 PCP: Patient, No Pcp Per   Brief Narrative:  64 year old male with history of COPD, cocaine abuse, tobacco abuse presented on Jul 07, 2019 with aspiration pneumonia and was found to have hypopharyngeal mass.  He had direct laryngoscopy with biopsy and tracheostomy on Jul 09, 2019.  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  He has had a prolonged hospitalization.  He had a G-tube placed on Jul 14, 2019.  He had some tracheostomy bleeding on Jul 26, 2019 which resolved without any intervention. On Jul 27, 2019, he was transferred to ICU for respiratory distress; placed on ventilator.  On Jul 30, 2019, he had episode of atrial flutter which responded to amiodarone boluses only.  Subsequently, he has been off of mechanical ventilation.  He was transferred back to [facility] service on 08/05/19  Assessment & Plan:   Acute hypoxic respiratory failure, currently trach dependent Aspiration pneumonia: Treated with antibiotics -Changed to #6 cuffed trach by ENT on Jul 27, 2019.  Patient tolerated trach collar since a.m. of Jul 31, 2019 and vent was discontinued on Aug 02, 2019. -Transferred back to [facility] service on 08/05/2019 -Trach care as per pulmonary. -Wean oxygen as able: Currently still requiring 5 L oxygen -Patient is not a candidate for decannulation because of laryngeal cancer -Currently hemodynamically stable with no temperature spikes.  Check labs tomorrow.  Squamous cell carcinoma of larynx Left lower lung mass -Currently undergoing radiation therapy.  Oncology following intermittently.  Outpatient follow-up with oncology  Dysphagia Cachexia Severe protein calorie malnutrition Generalized conditioning -Continue tube feeds via G-tube.  G-tube placed on 07/14/2019 -Overall prognosis is guarded to poor.  Patient remains full  code.  Palliative care has evaluated patient as well during the hospitalization.  COPD with emphysema -Continue supplemental oxygen.  Continue nebs  Anemia of critical illness, iron deficiency, chronic disease -Hemoglobin stable.  Monitor intermittently.  Paroxysmal/transient A. Fib/flutter -Had required amiodarone boluses briefly.  Currently rate controlled.  Continue metoprolol  Stage II sacral pressure ulcer -Present on admission -Continue wound care  Depression -Continue Zoloft   DVT prophylaxis: SCDs Code Status: Full Family Communication: Spoke to patient at bedside  disposition Plan: Status is: Inpatient  Remains inpatient appropriate because: Will probably need LTAC placement versus SNF placement.  Currently still requiring tube feeding and has a trach requiring supplemental oxygen.  Currently stable for discharge to SNF if cleared by radiation oncology.  Social worker following   Dispo: The patient is from: Home              Anticipated d/c is to: SNF              Anticipated d/c date is: Once cleared by radiation oncology              Patient currently is medically stable to d/c.   Consultants: PCCM/ENT/oncology/palliative care/radiation oncology  Procedures:  Intubation Tracheostomy on 07/09/2019 G-tube on 07/14/2019  Antimicrobials:  Unasyn from 07/07/2019-07/14/2019  Subjective: Patient seen and examined at bedside.  Poor historian.  Nods his head to some questions.  No overnight fever, vomiting or worsening shortness breath reported.   Objective: Vitals:   08/10/19 0000 08/10/19 0200 08/10/19 0400 08/10/19 0417  BP: 102/75 97/74  101/76  Pulse: 72  76 72  Resp: (!) '21 18 16 16  '$ Temp:  TempSrc:      SpO2: 99%  98% 99%  Weight:      Height:        Intake/Output Summary (Last 24 hours) at 08/10/2019 0744 Last data filed at 08/10/2019 0000 Gross per 24 hour  Intake --  Output 1150 ml  Net -1150 ml   Filed Weights   07/07/19 2049 07/09/19 0949  07/20/19 1600  Weight: 42 kg 42 kg 44 kg    Examination:  General exam: No distress.  Looks older than stated age.  Very cachectic.  Chronically ill looking.  Currently still requiring 5 L oxygen via tracheostomy.  Poor historian. ENT: Lurline Idol present with dressing Respiratory system: Bilateral decreased breath sounds at bases with scattered crackles  cardiovascular system: S1-S2 heard, rate controlled  gastrointestinal system: Abdomen is nondistended, soft and nontender.  Normal bowel sounds heard.  G-tube present. Extremities: No cyanosis.  No lower extremity edema  Central nervous system: Awake.  Still able to not his head to some questions.  No obvious focal neurological deficits.  Moves extremities skin: No other ulcers/ecchymosis psychiatry: Flat affect   data Reviewed: I have personally reviewed following labs and imaging studies  CBC: Recent Labs  Lab 08/06/19 0231 08/08/19 0300  WBC 3.2* 3.9*  NEUTROABS 1.6*  --   HGB 9.4* 9.2*  HCT 28.1* 28.7*  MCV 86.7 88.3  PLT 260 092   Basic Metabolic Panel: Recent Labs  Lab 08/06/19 0231 08/08/19 0300  NA 133* 135  K 4.6 4.4  CL 97* 94*  CO2 27 34*  GLUCOSE 97 88  BUN 17 20  CREATININE 0.46* 0.37*  CALCIUM 8.6* 9.1  MG 1.5*  --    GFR: Estimated Creatinine Clearance: 58.1 mL/min (A) (by C-G formula based on SCr of 0.37 mg/dL (L)). Liver Function Tests: No results for input(s): AST, ALT, ALKPHOS, BILITOT, PROT, ALBUMIN in the last 168 hours. No results for input(s): LIPASE, AMYLASE in the last 168 hours. No results for input(s): AMMONIA in the last 168 hours. Coagulation Profile: No results for input(s): INR, PROTIME in the last 168 hours. Cardiac Enzymes: No results for input(s): CKTOTAL, CKMB, CKMBINDEX, TROPONINI in the last 168 hours. BNP (last 3 results) No results for input(s): PROBNP in the last 8760 hours. HbA1C: No results for input(s): HGBA1C in the last 72 hours. CBG: Recent Labs  Lab 08/09/19 1149  08/09/19 1630 08/09/19 1950 08/10/19 0047 08/10/19 0417  GLUCAP 140* 81 123* 91 83   Lipid Profile: No results for input(s): CHOL, HDL, LDLCALC, TRIG, CHOLHDL, LDLDIRECT in the last 72 hours. Thyroid Function Tests: No results for input(s): TSH, T4TOTAL, FREET4, T3FREE, THYROIDAB in the last 72 hours. Anemia Panel: No results for input(s): VITAMINB12, FOLATE, FERRITIN, TIBC, IRON, RETICCTPCT in the last 72 hours. Sepsis Labs: No results for input(s): PROCALCITON, LATICACIDVEN in the last 168 hours.  No results found for this or any previous visit (from the past 240 hour(s)).       Radiology Studies: No results found.      Scheduled Meds: . ascorbic acid  100 mg Per Tube BID  . chlorhexidine gluconate (MEDLINE KIT)  15 mL Mouth Rinse BID  . Chlorhexidine Gluconate Cloth  6 each Topical Daily  . docusate  100 mg Oral BID  . feeding supplement (OSMOLITE 1.5 CAL)  237 mL Per Tube 5 X Daily  . feeding supplement (PRO-STAT SUGAR FREE 64)  30 mL Per Tube BID  . ferrous sulfate  300 mg Per Tube BID  WC  . free water  120 mL Per Tube 5 X Daily  . ipratropium  0.5 mg Nebulization TID  . levalbuterol  0.63 mg Nebulization TID  . mouth rinse  15 mL Mouth Rinse 10 times per day  . metoprolol tartrate  25 mg Per Tube BID  . pantoprazole sodium  40 mg Per Tube Q24H  . polyethylene glycol  17 g Oral Daily  . senna-docusate  1 tablet Oral BID  . sertraline  50 mg Per Tube Daily  . thiamine  100 mg Per Tube Daily   Continuous Infusions:        Aline August, MD [facility] 08/10/2019, 7:44 AM

## 2019-08-10 NOTE — Progress Notes (Signed)
I checked in on Devon Peterson today.  Working towards discharge to SNF with plan to complete radiation therapy.  I recommend he be followed by palliative care as an outpatient at skilled facility.  His goals remain for continued aggressive interventions.    No other palliative specific interventions recommended at this time.  Please call if there are specific needs with which we can be of further assistance in the care of Devon Peterson.  Micheline Rough, MD [place] [facility] [phone]

## 2019-08-10 NOTE — Progress Notes (Signed)
Physical Therapy Treatment Patient Details Name: Devon Peterson MRN: [id number] DOB: 02-07-1956 Today's Date: 08/10/2019    History of Present Illness Significant Hospital Events 5/04 admitted5/05 seen by ENT as initial evaluation underwent flexible laryngoscopy5/06 to operating room for direct laryngoscopy and biopsy, and tracheostomy placement pulmonary asked to evaluate postop for hypotension5/11 G tube placed5/23 tracheostomy bleeding >> no interventions needs5/24 transfer to ICU for respiratory distress; placed on ventilator and changed to #4 cuffed trach (unable to pass #6 cuffed trach); ENT changed to #6 cuffed trach5/27 episode of a flutter >  Amiodarone boluses only 5/31: Off from mechanical ventilation for 48 hours6/1: Getting up with physical therapy    PT Comments    Pt tolerates BLE strengthening this session, requires intermittent cues for form to improve ROM and control with movement. Pt requests to not get out of bed or sitting on EOB due to wanting to conserve energy for radiation treatment in ~15 minutes; pt agreeable to supine exercises until radiation comes to get him. Pt requires therapeutic rest breaks between exercises to recover. Pt SpO2 99-100% with exercises and denies pain. Patient will benefit from continued physical therapy in hospital and recommendations below to increase strength, balance, endurance for safe ADLs and gait.   Follow Up Recommendations  SNF     Equipment Recommendations  None recommended by PT    Recommendations for Other Services       Precautions / Restrictions Precautions Precautions: Fall Precaution Comments: TRACH 28% (5 LPM), PEG, orthostatic Restrictions Weight Bearing Restrictions: No    Mobility  Bed Mobility     Transfers   Ambulation/Gait      Stairs             Wheelchair Mobility    Modified Rankin (Stroke Patients Only)       Balance                 Cognition Arousal/Alertness:  Awake/alert Behavior During Therapy: Flat affect Overall Cognitive Status: Within Functional Limits for tasks assessed  General Comments: covers trach to communicate      Exercises General Exercises - Lower Extremity Ankle Circles/Pumps: Supine;Strengthening;Both;10 reps Heel Slides: Supine;Strengthening;Both;10 reps Hip ABduction/ADduction: Supine;Strengthening;Both;10 reps Straight Leg Raises: Supine;Strengthening;Both;10 reps    General Comments        Pertinent Vitals/Pain Pain Assessment: No/denies pain    Home Living                      Prior Function            PT Goals (current goals can now be found in the care plan section) Acute Rehab PT Goals Patient Stated Goal: agreed to mobility PT Goal Formulation: With patient Time For Goal Achievement: 08/14/19 Potential to Achieve Goals: Fair Progress towards PT goals: Progressing toward goals    Frequency    Min 2X/week      PT Plan Current plan remains appropriate    Co-evaluation              AM-PAC PT "6 Clicks" Mobility   Outcome Measure  Help needed turning from your back to your side while in a flat bed without using bedrails?: A Little Help needed moving from lying on your back to sitting on the side of a flat bed without using bedrails?: A Little Help needed moving to and from a bed to a chair (including a wheelchair)?: A Lot Help needed standing up from a chair using your arms (e.g., wheelchair or  bedside chair)?: A Little Help needed to walk in hospital room?: A Lot Help needed climbing 3-5 steps with a railing? : Total 6 Click Score: 14    End of Session Equipment Utilized During Treatment: Oxygen Activity Tolerance: Patient tolerated treatment well Patient left: in bed;with call bell/phone within reach;with bed alarm set Nurse Communication: Mobility status PT Visit Diagnosis: Muscle weakness (generalized) (M62.81);Difficulty in walking, not elsewhere classified (R26.2)      Time: 8871-9597 PT Time Calculation (min) (ACUTE ONLY): 14 min  Charges:  $Therapeutic Exercise: 8-22 mins                      Tori Muzammil Bruins PT, DPT 08/10/19, 1:22 PM

## 2019-08-10 NOTE — TOC Progression Note (Signed)
Transition of Care [facility]) - Progression Note    Patient Details  Name: Greg Cratty MRN: [id number] Date of Birth: [birth year]  [facility] CM/SW Contact  Purcell Mouton, RN Phone Number: 08/10/2019, 12:11 PM  Clinical Narrative:    SNF will need to know how many Raditation treatments pt will need before coming. SNF will need to have admission approved by administration. SNF also asked when will pt be ready for discharge?   Expected Discharge Plan: Skilled Nursing Facility Barriers to Discharge: Continued Medical Work up  Expected Discharge Plan and Services Expected Discharge Plan: [place] Acute Care Choice: [place]                                         Social Determinants of Health (SDOH) Interventions    Readmission Risk Interventions Readmission Risk Prevention Plan 07/27/2019  Transportation Screening Complete  HRI or [place] Complete  Social Work Consult for [place] Planning/Counseling Complete  Palliative Care Screening Complete  Medication Review Press photographer) Complete  Some recent data might be hidden

## 2019-08-10 NOTE — Progress Notes (Signed)
Nutrition Follow-up  DOCUMENTATION CODES:   Severe malnutrition in context of chronic illness, Underweight  INTERVENTION:  - continue 1 carton (237 ml) Osmolite 1.5 x5 day with 30 ml prostat (or equivalent) BID and 120 ml free water per TF bolus.  - weigh patient today.   NUTRITION DIAGNOSIS:   Severe Malnutrition related to chronic illness(COPD) as evidenced by severe fat depletion, severe muscle depletion. -ongoing  GOAL:   Patient will meet greater than or equal to 90% of their needs -met with TF regimen  MONITOR:   TF tolerance, Diet advancement, Labs, Weight trends, Skin  ASSESSMENT:   64 year old male with past medical history of COPD, polysubstance abuse, recent admission 2/12-2/14 for sepsis secondary to pneumonia and COPD exacerbation presented with SOB, fatigue, and unintentional wt loss. Family reports patient has done poorly s/p returning home from hospital, has difficulty swallowing food, spitting up saliva, and has chronic cough. CXR revealed new left lower lobe airspace opacity concerning for pneumonia, CT angiogram showed abnormal soft tissue in hypopharynx suspicious for underlying mass, and dense bilateral lower lobe consolidation.  Significant Events: 5/4- admission 5/5- initial RD assessment 5/6- trach in OR 5/11- PEG placed in IR 5/12- TF initiation 5/20- first XRT treatment 5/21- changed from continuous to bolus TF 5/24- Rapid Response for tachypnea and labored respirations; intubated via trach 5/28- taken off of vent and placed back on trach collar 6/5- transferred from 2W to 4W   Patient has not been weighed since 5/17. He remains NPO and has PEG through which he is receiving 1 carton Osmolite 1.5 x5/day with 30 ml prostat BID and 120 ml free water per TF bolus. This regimen is providing 1975 kcal, 104 grams protein, and 1505 ml free water.   Per notes: - ongoing XRT d/t squamous cell carcinoma of the larynx - dysphagia - severe PCM - guarded to  poor prognosis--remains Full Code - likely need for LTAC vs SNF once cleared by Radiation Oncology    Labs reviewed; CBGs: 91, 83, 77 mg/dl, Cl: 94 mmol/l, creatinine: 0.37 mg/dl. Medications reviewed; 100 mg ascorbic acid BID, 100 mg colace BID, 300 mg ferrous sulfate BID, 40 mg protonix per PEG/day, 17 g miralax/day, 1 tablet senokot BID, 100 mg thiamine per PEG/day.    Diet Order:   Diet Order            Diet NPO time specified Except for: Ice Chips  Diet effective now              EDUCATION NEEDS:   No education needs have been identified at this time  Skin:  Skin Assessment: Skin Integrity Issues: Skin Integrity Issues:: Stage II, Incisions Stage II: mid sacrum Incisions: neck for trach (5/6)  Last BM:  6/7  Height:   Ht Readings from Last 1 Encounters:  07/27/19 '5\' 11"'$  (1.803 m)    Weight:   Wt Readings from Last 1 Encounters:  07/20/19 44 kg   Estimated Nutritional Needs:  Kcal:  1800-2050 kcal Protein:  80-100 Fluid:  >/= 1.8 L/day     Jarome Matin, MS, RD, LDN, CNSC Inpatient Clinical Dietitian RD pager # available in [facility]  After hours/weekend pager # available in [employer]

## 2019-08-11 ENCOUNTER — Ambulatory Visit
Admit: 2019-08-11 | Discharge: 2019-08-11 | Disposition: A | Discharge status: Home / Self Care | Payer: Medicaid Other | Attending: Radiation Oncology | Admitting: Radiation Oncology

## 2019-08-11 LAB — CBC WITH DIFFERENTIAL/PLATELET
Abs Immature Granulocytes: 0.04 10*3/uL (ref 0.00–0.07)
Basophils Absolute: 0 10*3/uL (ref 0.0–0.1)
Basophils Relative: 1 %
Eosinophils Absolute: 0.1 10*3/uL (ref 0.0–0.5)
Eosinophils Relative: 2 %
HCT: 31.8 % — ABNORMAL LOW (ref 39.0–52.0)
Hemoglobin: 10.5 g/dL — ABNORMAL LOW (ref 13.0–17.0)
Immature Granulocytes: 1 %
Lymphocytes Relative: 16 %
Lymphs Abs: 0.7 10*3/uL (ref 0.7–4.0)
MCH: 27.9 pg (ref 26.0–34.0)
MCHC: 33 g/dL (ref 30.0–36.0)
MCV: 84.4 fL (ref 80.0–100.0)
Monocytes Absolute: 0.6 10*3/uL (ref 0.1–1.0)
Monocytes Relative: 14 %
Neutro Abs: 3.2 10*3/uL (ref 1.7–7.7)
Neutrophils Relative %: 66 %
Platelets: 280 10*3/uL (ref 150–400)
RBC: 3.77 MIL/uL — ABNORMAL LOW (ref 4.22–5.81)
RDW: 14.8 % (ref 11.5–15.5)
WBC: 4.7 10*3/uL (ref 4.0–10.5)
nRBC: 0 % (ref 0.0–0.2)

## 2019-08-11 LAB — BASIC METABOLIC PANEL
Anion gap: 14 (ref 5–15)
BUN: 19 mg/dL (ref 8–23)
CO2: 24 mmol/L (ref 22–32)
Calcium: 9.1 mg/dL (ref 8.9–10.3)
Chloride: 96 mmol/L — ABNORMAL LOW (ref 98–111)
Creatinine, Ser: 0.46 mg/dL — ABNORMAL LOW (ref 0.61–1.24)
GFR calc Af Amer: >60 mL/min (ref >60)
GFR calc non Af Amer: >60 mL/min (ref >60)
Glucose, Bld: 89 mg/dL (ref 70–99)
Potassium: 4.8 mmol/L (ref 3.5–5.1)
Sodium: 134 mmol/L — ABNORMAL LOW (ref 135–145)

## 2019-08-11 LAB — MAGNESIUM: Magnesium: 1.5 mg/dL — ABNORMAL LOW (ref 1.7–2.4)

## 2019-08-11 LAB — GLUCOSE, CAPILLARY
Glucose-Capillary: 117 mg/dL — ABNORMAL HIGH (ref 70–99)
Glucose-Capillary: 127 mg/dL — ABNORMAL HIGH (ref 70–99)
Glucose-Capillary: 140 mg/dL — ABNORMAL HIGH (ref 70–99)
Glucose-Capillary: 84 mg/dL (ref 70–99)
Glucose-Capillary: 86 mg/dL (ref 70–99)
Glucose-Capillary: 91 mg/dL (ref 70–99)

## 2019-08-11 MED ORDER — MAGNESIUM SULFATE 2 GM/50ML IV SOLN
2.0000 g | Freq: Once | INTRAVENOUS | Status: AC
  Administered 2019-08-11: 2 g via INTRAVENOUS
  Filled 2019-08-11: qty 50

## 2019-08-11 NOTE — TOC Progression Note (Signed)
Transition of Care [facility]) - Progression Note    Patient Details  Name: Devon Peterson MRN: [id number] Date of Birth: January 20, 1956  Transition of Care [facility] CM/SW Contact  Purcell Mouton, RN Phone Number: 08/11/2019, 3:56 PM  Clinical Narrative:    A call was made to SNF coordinator concerning pt's admission to facility. Facility will not be able to take pt with Trach while he is receiving Radiation Treatment. Pt is on #8 Radiation today.    Expected Discharge Plan: Skilled Nursing Facility Barriers to Discharge: Continued Medical Work up  Expected Discharge Plan and Services Expected Discharge Plan: [place] Acute Care Choice: [place]                                         Social Determinants of Health (SDOH) Interventions    Readmission Risk Interventions Readmission Risk Prevention Plan 07/27/2019  Transportation Screening Complete  HRI or [place] Complete  Social Work Consult for [place] Planning/Counseling Complete  Palliative Care Screening Complete  Medication Review Press photographer) Complete  Some recent data might be hidden

## 2019-08-11 NOTE — Progress Notes (Signed)
Patient ID: Myrick Mcnairy, male   DOB: December 22, 1955, 64 y.o.   MRN: [id number]  PROGRESS NOTE    Filip Luten  [id number] DOB: Mar 20, 1955 DOA: 07/07/2019 PCP: Patient, No Pcp Per   Brief Narrative:  64 year old male with history of COPD, cocaine abuse, tobacco abuse presented on Jul 07, 2019 with aspiration pneumonia and was found to have hypopharyngeal mass.  He had direct laryngoscopy with biopsy and tracheostomy on Jul 09, 2019.  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  He has had a prolonged hospitalization.  He had a G-tube placed on Jul 14, 2019.  He had some tracheostomy bleeding on Jul 26, 2019 which resolved without any intervention. On Jul 27, 2019, he was transferred to ICU for respiratory distress; placed on ventilator.  On Jul 30, 2019, he had episode of atrial flutter which responded to amiodarone boluses only.  Subsequently, he has been off of mechanical ventilation.  He was transferred back to [facility] service on 08/05/19  Assessment & Plan:   Acute hypoxic respiratory failure, currently trach dependent Aspiration pneumonia: Treated with antibiotics -Changed to #6 cuffed trach by ENT on Jul 27, 2019.  Patient tolerated trach collar since a.m. of Jul 31, 2019 and vent was discontinued on Aug 02, 2019. -Transferred back to [facility] service on 08/05/2019 -Trach care as per pulmonary. -Wean oxygen as able: Currently requiring 2 L oxygen. -Patient is not a candidate for decannulation because of laryngeal cancer -Currently hemodynamically stable with no temperature spikes.  Check labs intermittently.  Squamous cell carcinoma of larynx Left lower lung mass -Currently undergoing radiation therapy with plans for radiation treatments till 08/21/2019.  Oncology following intermittently.  Outpatient follow-up with oncology  Dysphagia Cachexia Severe protein calorie malnutrition Generalized conditioning -Continue tube feeds via G-tube.  G-tube placed on 07/14/2019 -Overall  prognosis is guarded to poor.  Patient remains full code.  Palliative care has evaluated patient as well during the hospitalization.  Hypomagnesemia -Replace.  Repeat a.m. labs  COPD with emphysema -Continue supplemental oxygen.  Continue nebs  Anemia of critical illness, iron deficiency, chronic disease -Hemoglobin stable.  Monitor intermittently.  Paroxysmal/transient A. Fib/flutter -Had required amiodarone boluses briefly.  Currently rate controlled.  Continue metoprolol  Stage II sacral pressure ulcer -Present on admission -Continue wound care  Depression -Continue Zoloft   Stage II pressure injuries to mid sacrum: Present on admission -3 separate areas.  Continue wound care Pressure Injury 07/07/19 Sacrum Mid;Upper Stage 2 -  Partial thickness loss of dermis presenting as a shallow open injury with a red, pink wound bed without slough. 3 each small separate open areas (Active)  07/07/19 2107  Location: Sacrum  Location Orientation: Mid;Upper  Staging: Stage 2 -  Partial thickness loss of dermis presenting as a shallow open injury with a red, pink wound bed without slough.  Wound Description (Comments): 3 each small separate open areas  Present on Admission: Yes     DVT prophylaxis: SCDs Code Status: Full Family Communication: Spoke to patient at bedside  disposition Plan: Status is: Inpatient  Remains inpatient appropriate because: Currently still requiring tube feeding and has a trach requiring supplemental oxygen.  Currently stable for discharge to SNF if cleared by radiation oncology.  Social worker following.  Requires radiation treatment till 08/21/2019 at least.   Dispo: The patient is from: Home              Anticipated d/c is to: SNF  Anticipated d/c date is: Once cleared by radiation oncology              Patient currently is medically stable to d/c.   Consultants: PCCM/ENT/oncology/palliative care/radiation oncology  Procedures:    Intubation Tracheostomy on 07/09/2019 G-tube on 07/14/2019  Antimicrobials:  Unasyn from 07/07/2019-07/14/2019  Subjective: Patient seen and examined at bedside.  Poor historian.  No overnight fever, worsening short of breath or vomiting reported.  Nods his head to some questions.  Objective: Vitals:   08/11/19 0400 08/11/19 0500 08/11/19 0600 08/11/19 0700  BP:      Pulse:      Resp:      Temp:      TempSrc:      SpO2: 97% 100% 98% 98%  Weight:      Height:        Intake/Output Summary (Last 24 hours) at 08/11/2019 0752 Last data filed at 08/11/2019 0507 Gross per 24 hour  Intake --  Output 750 ml  Net -750 ml   Filed Weights   07/09/19 0949 07/20/19 1600 08/10/19 1225  Weight: 42 kg 44 kg 42.2 kg    Examination:  General exam: No acute distress.  Looks older than stated age.  Very cachectic.  Chronically ill looking.  Currently on 2 L oxygen via tracheostomy.  Poor historian. ENT: Tracheostomy present Respiratory system: Bilateral decreased breath sounds at bases with crackles.  No wheezing  cardiovascular system: Rate controlled, S1-S2 heard gastrointestinal system: Abdomen is nondistended, soft and nontender.  Bowel sounds heard.  G-tube present. Extremities: No lower extremity edema present.  No clubbing or cyanosis Central nervous system: Awake, nods his head to some questions.  No obvious focal neurological deficits.  Moves extremities skin: No other ecchymosis/petechiae/rash  psychiatry: Extremely flat affect  data Reviewed: I have personally reviewed following labs and imaging studies  CBC: Recent Labs  Lab 08/06/19 0231 08/08/19 0300 08/11/19 0523  WBC 3.2* 3.9* 4.7  NEUTROABS 1.6*  --  3.2  HGB 9.4* 9.2* 10.5*  HCT 28.1* 28.7* 31.8*  MCV 86.7 88.3 84.4  PLT 260 319 400   Basic Metabolic Panel: Recent Labs  Lab 08/06/19 0231 08/08/19 0300 08/11/19 0523  NA 133* 135 134*  K 4.6 4.4 4.8  CL 97* 94* 96*  CO2 27 34* 24  GLUCOSE 97 88 89  BUN '17  20 19  '$ CREATININE 0.46* 0.37* 0.46*  CALCIUM 8.6* 9.1 9.1  MG 1.5*  --  1.5*   GFR: Estimated Creatinine Clearance: 55.7 mL/min (A) (by C-G formula based on SCr of 0.46 mg/dL (L)). Liver Function Tests: No results for input(s): AST, ALT, ALKPHOS, BILITOT, PROT, ALBUMIN in the last 168 hours. No results for input(s): LIPASE, AMYLASE in the last 168 hours. No results for input(s): AMMONIA in the last 168 hours. Coagulation Profile: No results for input(s): INR, PROTIME in the last 168 hours. Cardiac Enzymes: No results for input(s): CKTOTAL, CKMB, CKMBINDEX, TROPONINI in the last 168 hours. BNP (last 3 results) No results for input(s): PROBNP in the last 8760 hours. HbA1C: No results for input(s): HGBA1C in the last 72 hours. CBG: Recent Labs  Lab 08/10/19 1638 08/10/19 2043 08/11/19 0022 08/11/19 0453 08/11/19 0744  GLUCAP 129* 98 117* 84 86   Lipid Profile: No results for input(s): CHOL, HDL, LDLCALC, TRIG, CHOLHDL, LDLDIRECT in the last 72 hours. Thyroid Function Tests: No results for input(s): TSH, T4TOTAL, FREET4, T3FREE, THYROIDAB in the last 72 hours. Anemia Panel: No results for input(s):  VITAMINB12, FOLATE, FERRITIN, TIBC, IRON, RETICCTPCT in the last 72 hours. Sepsis Labs: No results for input(s): PROCALCITON, LATICACIDVEN in the last 168 hours.  No results found for this or any previous visit (from the past 240 hour(s)).       Radiology Studies: No results found.      Scheduled Meds: . ascorbic acid  100 mg Per Tube BID  . chlorhexidine gluconate (MEDLINE KIT)  15 mL Mouth Rinse BID  . Chlorhexidine Gluconate Cloth  6 each Topical Daily  . docusate  100 mg Oral BID  . feeding supplement (OSMOLITE 1.5 CAL)  237 mL Per Tube 5 X Daily  . feeding supplement (PRO-STAT SUGAR FREE 64)  30 mL Per Tube BID  . ferrous sulfate  300 mg Per Tube BID WC  . free water  120 mL Per Tube 5 X Daily  . mouth rinse  15 mL Mouth Rinse 10 times per day  . metoprolol  tartrate  25 mg Per Tube BID  . pantoprazole sodium  40 mg Per Tube Q24H  . polyethylene glycol  17 g Oral Daily  . senna-docusate  1 tablet Oral BID  . sertraline  50 mg Per Tube Daily  . thiamine  100 mg Per Tube Daily   Continuous Infusions:        Aline August, MD [facility] 08/11/2019, 7:52 AM

## 2019-08-11 NOTE — Progress Notes (Signed)
Patient continues to change between green and yellow due to RR. MD aware. Q4 hr vitals continued.

## 2019-08-12 ENCOUNTER — Ambulatory Visit
Admit: 2019-08-12 | Discharge: 2019-08-12 | Disposition: A | Discharge status: Home / Self Care | Payer: Medicaid Other | Attending: Radiation Oncology | Admitting: Radiation Oncology

## 2019-08-12 LAB — COMPREHENSIVE METABOLIC PANEL
ALT: 16 U/L (ref 0–44)
AST: 20 U/L (ref 15–41)
Albumin: 3.2 g/dL — ABNORMAL LOW (ref 3.5–5.0)
Alkaline Phosphatase: 66 U/L (ref 38–126)
Anion gap: 11 (ref 5–15)
BUN: 22 mg/dL (ref 8–23)
CO2: 27 mmol/L (ref 22–32)
Calcium: 9.1 mg/dL (ref 8.9–10.3)
Chloride: 96 mmol/L — ABNORMAL LOW (ref 98–111)
Creatinine, Ser: 0.41 mg/dL — ABNORMAL LOW (ref 0.61–1.24)
GFR calc Af Amer: >60 mL/min (ref >60)
GFR calc non Af Amer: >60 mL/min (ref >60)
Glucose, Bld: 81 mg/dL (ref 70–99)
Potassium: 4.5 mmol/L (ref 3.5–5.1)
Sodium: 134 mmol/L — ABNORMAL LOW (ref 135–145)
Total Bilirubin: 0.3 mg/dL (ref 0.3–1.2)
Total Protein: 7 g/dL (ref 6.5–8.1)

## 2019-08-12 LAB — CBC WITH DIFFERENTIAL/PLATELET
Abs Immature Granulocytes: 0.01 10*3/uL (ref 0.00–0.07)
Basophils Absolute: 0 10*3/uL (ref 0.0–0.1)
Basophils Relative: 1 %
Eosinophils Absolute: 0.1 10*3/uL (ref 0.0–0.5)
Eosinophils Relative: 1 %
HCT: 31.4 % — ABNORMAL LOW (ref 39.0–52.0)
Hemoglobin: 10.1 g/dL — ABNORMAL LOW (ref 13.0–17.0)
Immature Granulocytes: 0 %
Lymphocytes Relative: 15 %
Lymphs Abs: 0.7 10*3/uL (ref 0.7–4.0)
MCH: 27.7 pg (ref 26.0–34.0)
MCHC: 32.2 g/dL (ref 30.0–36.0)
MCV: 86.3 fL (ref 80.0–100.0)
Monocytes Absolute: 0.6 10*3/uL (ref 0.1–1.0)
Monocytes Relative: 14 %
Neutro Abs: 3 10*3/uL (ref 1.7–7.7)
Neutrophils Relative %: 69 %
Platelets: 275 10*3/uL (ref 150–400)
RBC: 3.64 MIL/uL — ABNORMAL LOW (ref 4.22–5.81)
RDW: 14.6 % (ref 11.5–15.5)
WBC: 4.3 10*3/uL (ref 4.0–10.5)
nRBC: 0 % (ref 0.0–0.2)

## 2019-08-12 LAB — GLUCOSE, CAPILLARY
Glucose-Capillary: 134 mg/dL — ABNORMAL HIGH (ref 70–99)
Glucose-Capillary: 136 mg/dL — ABNORMAL HIGH (ref 70–99)
Glucose-Capillary: 139 mg/dL — ABNORMAL HIGH (ref 70–99)
Glucose-Capillary: 68 mg/dL — ABNORMAL LOW (ref 70–99)
Glucose-Capillary: 79 mg/dL (ref 70–99)
Glucose-Capillary: 84 mg/dL (ref 70–99)

## 2019-08-12 LAB — MAGNESIUM: Magnesium: 1.8 mg/dL (ref 1.7–2.4)

## 2019-08-12 NOTE — Progress Notes (Signed)
PROGRESS NOTE  Devon Peterson  [id number] DOB: [birth year] DOA: 07/07/2019 PCP: Patient, No Pcp Per   Brief Narrative: 64 year old male with history of COPD, cocaine abuse, tobacco abuse presented on Jul 07, 2019 with aspiration pneumonia and was found to have hypopharyngeal mass.  He had direct laryngoscopy with biopsy and tracheostomy on Jul 09, 2019.  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  He has had a prolonged hospitalization.  He had a G-tube placed on Jul 14, 2019.  He had some tracheostomy bleeding on Jul 26, 2019 which resolved without any intervention. On Jul 27, 2019, he was transferred to ICU for respiratory distress; placed on ventilator.  On Jul 30, 2019, he had episode of atrial flutter which responded to amiodarone boluses only.  Subsequently, he has been off of mechanical ventilation.  He was transferred back to [facility] service on 08/05/19  Assessment & Plan: Principal Problem:   Aspiration pneumonia ([place]) Active Problems:   Sepsis ([place])   Dysphagia   Severe protein-calorie malnutrition ([place])   Alcohol use   Pressure injury of skin   Lung mass   Neck mass   Tracheostomy care ([place])   Squamous cell carcinoma of neck   Palliative care encounter   Acute respiratory insufficiency  Acute hypoxic respiratory failure, currently trach dependent Aspiration pneumonia: Treated with antibiotics -Changed to #6 cuffed trach by ENT on Jul 27, 2019.  Patient tolerated trach collar since a.m. of Jul 31, 2019 and vent was discontinued on Aug 02, 2019. -Transferred back to [facility] service on 08/05/2019 -Trach care as per pulmonary. -Wean oxygen as able: Currently requiring 2 L oxygen. -Patient is not a candidate for decannulation because of laryngeal cancer -Currently hemodynamically stable with no temperature spikes.  Check labs intermittently.  Squamous cell carcinoma of larynx Left lower lung mass -Currently undergoing radiation therapy with plans for radiation  treatments till 08/21/2019.  Oncology following intermittently.  Outpatient follow-up with oncology  Dysphagia Cachexia Severe protein calorie malnutrition Generalized conditioning -Continue tube feeds via G-tube.  G-tube placed on 07/14/2019 -Overall prognosis is guarded to poor.  Patient remains full code.  Palliative care has evaluated patient as well during the hospitalization.  Hypomagnesemia -Replace.  Repeat a.m. labs  COPD with emphysema -Continue supplemental oxygen.  Continue nebs  Anemia of critical illness, iron deficiency, chronic disease -Hemoglobin stable.  Monitor intermittently.  Paroxysmal/transient A. Fib/flutter -Had required amiodarone boluses briefly.  Currently rate controlled.  Continue metoprolol  Stage II sacral pressure ulcer -Present on admission -Continue wound care  Depression -Continue Zoloft   Stage II pressure injuries to mid sacrum: Present on admission -3 separate areas.  Continue wound care Pressure Injury 07/07/19 Sacrum Mid;Upper Stage 2 -  Partial thickness loss of dermis presenting as a shallow open injury with a red, pink wound bed without slough. 3 each small separate open areas (Active)  07/07/19 2107  Location: Sacrum  Location Orientation: Mid;Upper  Staging: Stage 2 -  Partial thickness loss of dermis presenting as a shallow open injury with a red, pink wound bed without slough.  Wound Description (Comments): 3 each small separate open areas  Present on Admission: Yes   Severe protein calorie malnutrition: BMI 12.  - Supplement protein as able.  DVT prophylaxis: SCDs Code Status: Full Family Communication: None at bedside Disposition Plan:  Status is: Inpatient  Remains inpatient appropriate because:Unsafe d/c plan   Dispo: The patient is from: Home  Anticipated d/c is to: SNF              Anticipated d/c date is: 08/21/2019              Patient currently is not medically stable to  d/c.  Consultants:   PCCM, ENT, oncology, palliative care, radiation oncology.   Procedures:  Intubation Tracheostomy on 07/09/2019 G-tube on 07/14/2019  Antimicrobials: Unasyn from 07/07/2019-07/14/2019  Subjective: No complaints, no pain. No difficulty breathing.   Objective: Vitals:   08/12/19 1244 08/12/19 1400 08/12/19 1546 08/12/19 1600  BP:  110/79  94/71  Pulse:   80   Resp: (!) 23 (!) 24 (!) 27 19  Temp:      TempSrc:      SpO2:   97%   Weight:      Height:       No intake or output data in the 24 hours ending 08/12/19 1733 Filed Weights   07/09/19 0949 07/20/19 1600 08/10/19 1225  Weight: 42 kg 44 kg 42.2 kg    Gen: 64 y.o. male in no distress  HEENT: Trach site c/d/i. Pulm: Non-labored breathing thru trach. Clear to auscultation bilaterally.  CV: Regular rate and rhythm. No murmur, rub, or gallop. No JVD, no pedal edema. GI: Abdomen soft, non-tender, non-distended, with normoactive bowel sounds. No organomegaly or masses felt. Ext: Warm, no deformities Skin: No rashes, lesions or ulcers Neuro: Alert and oriented. No focal neurological deficits. Psych: Judgement and insight appear normal. Mood & affect appropriate.   Data Reviewed: I have personally reviewed following labs and imaging studies  CBC: Recent Labs  Lab 08/06/19 0231 08/08/19 0300 08/11/19 0523 08/12/19 0417  WBC 3.2* 3.9* 4.7 4.3  NEUTROABS 1.6*  --  3.2 3.0  HGB 9.4* 9.2* 10.5* 10.1*  HCT 28.1* 28.7* 31.8* 31.4*  MCV 86.7 88.3 84.4 86.3  PLT 260 319 280 798   Basic Metabolic Panel: Recent Labs  Lab 08/06/19 0231 08/08/19 0300 08/11/19 0523 08/12/19 0417  NA 133* 135 134* 134*  K 4.6 4.4 4.8 4.5  CL 97* 94* 96* 96*  CO2 27 34* 24 27  GLUCOSE 97 88 89 81  BUN '17 20 19 22  '$ CREATININE 0.46* 0.37* 0.46* 0.41*  CALCIUM 8.6* 9.1 9.1 9.1  MG 1.5*  --  1.5* 1.8   GFR: Estimated Creatinine Clearance: 55.7 mL/min (A) (by C-G formula based on SCr of 0.41 mg/dL (L)). Liver Function  Tests: Recent Labs  Lab 08/12/19 0417  AST 20  ALT 16  ALKPHOS 66  BILITOT 0.3  PROT 7.0  ALBUMIN 3.2*   No results for input(s): LIPASE, AMYLASE in the last 168 hours. No results for input(s): AMMONIA in the last 168 hours. Coagulation Profile: No results for input(s): INR, PROTIME in the last 168 hours. Cardiac Enzymes: No results for input(s): CKTOTAL, CKMB, CKMBINDEX, TROPONINI in the last 168 hours. BNP (last 3 results) No results for input(s): PROBNP in the last 8760 hours. HbA1C: No results for input(s): HGBA1C in the last 72 hours. CBG: Recent Labs  Lab 08/11/19 2114 08/12/19 0011 08/12/19 0351 08/12/19 0825 08/12/19 1239  GLUCAP 91 136* 79 84 68*   Lipid Profile: No results for input(s): CHOL, HDL, LDLCALC, TRIG, CHOLHDL, LDLDIRECT in the last 72 hours. Thyroid Function Tests: No results for input(s): TSH, T4TOTAL, FREET4, T3FREE, THYROIDAB in the last 72 hours. Anemia Panel: No results for input(s): VITAMINB12, FOLATE, FERRITIN, TIBC, IRON, RETICCTPCT in the last 72 hours. Urine analysis:  Component Value Date/Time   COLORURINE YELLOW 07/07/2019 1729   APPEARANCEUR HAZY (A) 07/07/2019 1729   LABSPEC 1.016 07/07/2019 1729   PHURINE 5.0 07/07/2019 1729   GLUCOSEU NEGATIVE 07/07/2019 1729   HGBUR NEGATIVE 07/07/2019 1729   BILIRUBINUR NEGATIVE 07/07/2019 1729   KETONESUR NEGATIVE 07/07/2019 1729   PROTEINUR 30 (A) 07/07/2019 1729   UROBILINOGEN 1.0 10/28/2008 0458   NITRITE NEGATIVE 07/07/2019 1729   LEUKOCYTESUR NEGATIVE 07/07/2019 1729   No results found for this or any previous visit (from the past 240 hour(s)).    Radiology Studies: No results found.  Scheduled Meds: . ascorbic acid  100 mg Per Tube BID  . chlorhexidine gluconate (MEDLINE KIT)  15 mL Mouth Rinse BID  . Chlorhexidine Gluconate Cloth  6 each Topical Daily  . docusate  100 mg Oral BID  . feeding supplement (OSMOLITE 1.5 CAL)  237 mL Per Tube 5 X Daily  . feeding supplement  (PRO-STAT SUGAR FREE 64)  30 mL Per Tube BID  . ferrous sulfate  300 mg Per Tube BID WC  . free water  120 mL Per Tube 5 X Daily  . mouth rinse  15 mL Mouth Rinse 10 times per day  . metoprolol tartrate  25 mg Per Tube BID  . pantoprazole sodium  40 mg Per Tube Q24H  . polyethylene glycol  17 g Oral Daily  . senna-docusate  1 tablet Oral BID  . sertraline  50 mg Per Tube Daily  . thiamine  100 mg Per Tube Daily   Continuous Infusions:   LOS: 36 days   Time spent: 25 minutes.  Patrecia Pour, MD [facility] [email or web address] 08/12/2019, 5:33 PM

## 2019-08-13 ENCOUNTER — Ambulatory Visit
Admit: 2019-08-13 | Discharge: 2019-08-13 | Disposition: A | Discharge status: Home / Self Care | Payer: Medicaid Other | Attending: Radiation Oncology | Admitting: Radiation Oncology

## 2019-08-13 LAB — GLUCOSE, CAPILLARY
Glucose-Capillary: 104 mg/dL — ABNORMAL HIGH (ref 70–99)
Glucose-Capillary: 111 mg/dL — ABNORMAL HIGH (ref 70–99)
Glucose-Capillary: 118 mg/dL — ABNORMAL HIGH (ref 70–99)
Glucose-Capillary: 118 mg/dL — ABNORMAL HIGH (ref 70–99)
Glucose-Capillary: 82 mg/dL (ref 70–99)
Glucose-Capillary: 82 mg/dL (ref 70–99)

## 2019-08-13 NOTE — Progress Notes (Signed)
PT Cancellation Note  Patient Details Name: Charles Andringa MRN: [id number] DOB: 1955/06/12   Cancelled Treatment:    Reason Eval/Treat Not Completed: Other (comment)  Pt off floor for radiation and when checked back later declined due to fatigue.  Will f/u as able. Abran Richard, PT Acute Rehab Services Pager [phone] [facility] 08/13/2019, 4:44 PM

## 2019-08-13 NOTE — Progress Notes (Signed)
Nutrition Follow-up  DOCUMENTATION CODES:   Severe malnutrition in context of chronic illness, Underweight  INTERVENTION:  - continue 1 carton (237 ml) Osmolite 1.5 x5/day with 30 ml prostat (or equivalent) BID and 120 ml free water/TF bolus.  NUTRITION DIAGNOSIS:   Severe Malnutrition related to chronic illness (COPD) as evidenced by severe fat depletion, severe muscle depletion. -ongoing  GOAL:   Patient will meet greater than or equal to 90% of their needs -met with TF regimen  MONITOR:   TF tolerance, Diet advancement, Labs, Weight trends, Skin  ASSESSMENT:   64 year old male with past medical history of COPD, polysubstance abuse, recent admission 2/12-2/14 for sepsis secondary to pneumonia and COPD exacerbation presented with SOB, fatigue, and unintentional wt loss. Family reports patient has done poorly s/p returning home from hospital, has difficulty swallowing food, spitting up saliva, and has chronic cough. CXR revealed new left lower lobe airspace opacity concerning for pneumonia, CT angiogram showed abnormal soft tissue in hypopharynx suspicious for underlying mass, and dense bilateral lower lobe consolidation.  Significant Events: 5/4- admission 5/5- initial RD assessment 5/6- trach in OR 5/11- PEG placed in IR 5/12- TF initiation 5/20- first XRT treatment 5/21- changed from continuous to bolus TF 5/24- Rapid Response for tachypnea and labored respirations; intubated via trach 5/28- taken off of vent and placed back on trach collar 6/5- transferred from 2W to 4W   Patient remains NPO. PEG in place and patient is receiving 1 carton (237 ml) Osmolite 1.5 x5/day with 30 ml prostat BID and 120 ml free water/TF bolus. This regimen provides 1975 kcal, 104 grams protein, and 1505 ml free water.  Patient was last weighed on 6/7 and weight has been fairly stable since admission on 5/4.  MD note from yesterday indicates likely d/c on 6/18.    Labs reviewed; CBGs: 104,  82, 82, 118 mg/dl, Na: 134 mmol/l, Cl: 96 mmol/l, creatinine: 0.41 mg/dl. Medications reviewed; 100 mg ascorbic acid BID, 100 mg colace BID, 300 mg ferrous sulfate BID, 17 g miralax/day, 1 tablet senokot BID, 100 mg thiamine/day.    Diet Order:   Diet Order            Diet NPO time specified Except for: Ice Chips  Diet effective now                 EDUCATION NEEDS:   No education needs have been identified at this time  Skin:  Skin Assessment: Skin Integrity Issues: Skin Integrity Issues:: Stage II, Incisions Stage II: mid sacrum Incisions: neck for trach (5/6)  Last BM:  6/10  Height:   Ht Readings from Last 1 Encounters:  07/27/19 _0  (1.803 m)    Weight:   Wt Readings from Last 1 Encounters:  08/10/19 42.2 kg    Estimated Nutritional Needs:  Kcal:  1800-2050 kcal Protein:  80-100 Fluid:  >/= 1.8 L/day     Jarome Matin, MS, RD, LDN, CNSC Inpatient Clinical Dietitian RD pager # available in [place]  After hours/weekend pager # available in [facility]

## 2019-08-13 NOTE — Progress Notes (Signed)
PROGRESS NOTE  Devon Peterson  [id number] DOB: December 09, 1955 DOA: 07/07/2019 PCP: Patient, No Pcp Per   Brief Narrative: 64 year old male with history of COPD, cocaine abuse, tobacco abuse presented on Jul 07, 2019 with aspiration pneumonia and was found to have hypopharyngeal mass.  He had direct laryngoscopy with biopsy and tracheostomy on Jul 09, 2019.  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  He has had a prolonged hospitalization.  He had a G-tube placed on Jul 14, 2019.  He had some tracheostomy bleeding on Jul 26, 2019 which resolved without any intervention. On Jul 27, 2019, he was transferred to ICU for respiratory distress; placed on ventilator.  On Jul 30, 2019, he had episode of atrial flutter which responded to amiodarone boluses only.  Subsequently, he has been off of mechanical ventilation.  He was transferred back to [facility] service on 08/05/19  Assessment & Plan: Principal Problem:   Aspiration pneumonia ([place]) Active Problems:   Sepsis ([place])   Dysphagia   Severe protein-calorie malnutrition ([place])   Alcohol use   Pressure injury of skin   Lung mass   Neck mass   Tracheostomy care ([place])   Squamous cell carcinoma of neck   Palliative care encounter   Acute respiratory insufficiency  Acute hypoxic respiratory failure, currently trach dependent Aspiration pneumonia: Treated with antibiotics -Changed to #6 cuffed trach by ENT on Jul 27, 2019.  Patient tolerated trach collar since a.m. of Jul 31, 2019 and vent was discontinued on Aug 02, 2019. -Transferred back to [facility] service on 08/05/2019 -Trach care as per pulmonary. -Wean oxygen as able: Currently requiring 2 L oxygen. -Patient is not a candidate for decannulation because of laryngeal cancer -Currently hemodynamically stable with no temperature spikes.  Check labs intermittently.  Squamous cell carcinoma of larynx Left lower lung mass -Currently undergoing radiation therapy with plans for radiation  treatments till 08/21/2019.  Oncology following intermittently.  Outpatient follow-up with oncology  Dysphagia Cachexia Severe protein calorie malnutrition Generalized conditioning -Continue tube feeds via G-tube.  G-tube placed on 07/14/2019 -Overall prognosis is guarded to poor.  Patient remains full code.  Palliative care has evaluated patient as well during the hospitalization. No evidence of current infection.  Hypomagnesemia -Replace.  Repeat a.m. labs  COPD with emphysema -Continue supplemental oxygen.  Continue nebs  Anemia of critical illness, iron deficiency, chronic disease -Hemoglobin stable.  Monitor intermittently.  Paroxysmal/transient A. Fib/flutter -Had required amiodarone boluses briefly.  Currently rate controlled.  Continue metoprolol  Stage II sacral pressure ulcer -Present on admission -Continue wound care  Depression -Continue Zoloft   Stage II pressure injuries to mid sacrum: Present on admission -3 separate areas.  Continue wound care Pressure Injury 07/07/19 Sacrum Mid;Upper Stage 2 -  Partial thickness loss of dermis presenting as a shallow open injury with a red, pink wound bed without slough. 3 each small separate open areas (Active)  07/07/19 2107  Location: Sacrum  Location Orientation: Mid;Upper  Staging: Stage 2 -  Partial thickness loss of dermis presenting as a shallow open injury with a red, pink wound bed without slough.  Wound Description (Comments): 3 each small separate open areas  Present on Admission: Yes   Severe protein calorie malnutrition: BMI 12.  - Supplement protein as able.  DVT prophylaxis: SCDs Code Status: Full Family Communication: None at bedside Disposition Plan:  Status is: Inpatient  Remains inpatient appropriate because:Unsafe d/c plan   Dispo: The patient is from: Home  Anticipated d/c is to: SNF              Anticipated d/c date is: 08/21/2019              Patient currently is not  medically stable to d/c.  Consultants:   PCCM, ENT, oncology, palliative care, radiation oncology.   Procedures:  Intubation Tracheostomy on 07/09/2019 G-tube on 07/14/2019  Antimicrobials: Unasyn from 07/07/2019-07/14/2019  Subjective: No complaints, breathing is stable. Feels no shortness of breath at rest. Not getting up much.  Objective: Vitals:   08/13/19 1221 08/13/19 1500 08/13/19 1536 08/13/19 1709  BP: 106/65     Pulse: 71  78   Resp: 16  18   Temp: 98.4 F (36.9 C)     TempSrc:      SpO2: 100% 100%  99%  Weight:      Height:        Intake/Output Summary (Last 24 hours) at 08/13/2019 2001 Last data filed at 08/13/2019 1851 Gross per 24 hour  Intake 2142 ml  Output 1575 ml  Net 567 ml   Filed Weights   07/09/19 0949 07/20/19 1600 08/10/19 1225  Weight: 42 kg 44 kg 42.2 kg   Gen: 64 y.o. male in no distress Pulm: Nonlabored breathing 5L O2 thru trach. CV: Regular rate and rhythm. No murmur, rub, or gallop. No JVD, no dependent edema. GI: Abdomen soft, non-tender, non-distended, with normoactive bowel sounds.  Ext: Warm, no deformities Skin: No new rashes, lesions or ulcers on visualized skin. PEG site c/d/i Neuro: Alert and oriented. No focal neurological deficits. Psych: Judgement and insight appear fair. Mood euthymic & affect congruent. Behavior is appropriate.    Data Reviewed: I have personally reviewed following labs and imaging studies  CBC: Recent Labs  Lab 08/08/19 0300 08/11/19 0523 08/12/19 0417  WBC 3.9* 4.7 4.3  NEUTROABS  --  3.2 3.0  HGB 9.2* 10.5* 10.1*  HCT 28.7* 31.8* 31.4*  MCV 88.3 84.4 86.3  PLT 319 280 916   Basic Metabolic Panel: Recent Labs  Lab 08/08/19 0300 08/11/19 0523 08/12/19 0417  NA 135 134* 134*  K 4.4 4.8 4.5  CL 94* 96* 96*  CO2 34* 24 27  GLUCOSE 88 89 81  BUN '20 19 22  '$ CREATININE 0.37* 0.46* 0.41*  CALCIUM 9.1 9.1 9.1  MG  --  1.5* 1.8   GFR: Estimated Creatinine Clearance: 55.7 mL/min (A) (by C-G  formula based on SCr of 0.41 mg/dL (L)). Liver Function Tests: Recent Labs  Lab 08/12/19 0417  AST 20  ALT 16  ALKPHOS 66  BILITOT 0.3  PROT 7.0  ALBUMIN 3.2*   No results for input(s): LIPASE, AMYLASE in the last 168 hours. No results for input(s): AMMONIA in the last 168 hours. Coagulation Profile: No results for input(s): INR, PROTIME in the last 168 hours. Cardiac Enzymes: No results for input(s): CKTOTAL, CKMB, CKMBINDEX, TROPONINI in the last 168 hours. BNP (last 3 results) No results for input(s): PROBNP in the last 8760 hours. HbA1C: No results for input(s): HGBA1C in the last 72 hours. CBG: Recent Labs  Lab 08/13/19 0028 08/13/19 0415 08/13/19 0754 08/13/19 1105 08/13/19 1707  GLUCAP 104* 82 82 118* 111*   Lipid Profile: No results for input(s): CHOL, HDL, LDLCALC, TRIG, CHOLHDL, LDLDIRECT in the last 72 hours. Thyroid Function Tests: No results for input(s): TSH, T4TOTAL, FREET4, T3FREE, THYROIDAB in the last 72 hours. Anemia Panel: No results for input(s): VITAMINB12, FOLATE, FERRITIN, TIBC, IRON, RETICCTPCT  in the last 72 hours. Urine analysis:    Component Value Date/Time   COLORURINE YELLOW 07/07/2019 1729   APPEARANCEUR HAZY (A) 07/07/2019 1729   LABSPEC 1.016 07/07/2019 1729   PHURINE 5.0 07/07/2019 1729   GLUCOSEU NEGATIVE 07/07/2019 1729   HGBUR NEGATIVE 07/07/2019 1729   BILIRUBINUR NEGATIVE 07/07/2019 1729   KETONESUR NEGATIVE 07/07/2019 1729   PROTEINUR 30 (A) 07/07/2019 1729   UROBILINOGEN 1.0 10/28/2008 0458   NITRITE NEGATIVE 07/07/2019 1729   LEUKOCYTESUR NEGATIVE 07/07/2019 1729   No results found for this or any previous visit (from the past 240 hour(s)).    Radiology Studies: No results found.  Scheduled Meds: . ascorbic acid  100 mg Per Tube BID  . chlorhexidine gluconate (MEDLINE KIT)  15 mL Mouth Rinse BID  . Chlorhexidine Gluconate Cloth  6 each Topical Daily  . docusate  100 mg Oral BID  . feeding supplement (OSMOLITE  1.5 CAL)  237 mL Per Tube 5 X Daily  . feeding supplement (PRO-STAT SUGAR FREE 64)  30 mL Per Tube BID  . ferrous sulfate  300 mg Per Tube BID WC  . free water  120 mL Per Tube 5 X Daily  . mouth rinse  15 mL Mouth Rinse 10 times per day  . metoprolol tartrate  25 mg Per Tube BID  . pantoprazole sodium  40 mg Per Tube Q24H  . polyethylene glycol  17 g Oral Daily  . senna-docusate  1 tablet Oral BID  . sertraline  50 mg Per Tube Daily  . thiamine  100 mg Per Tube Daily   Continuous Infusions:   LOS: 37 days   Time spent: 15 minutes.  Patrecia Pour, MD [facility] [email or web address] 08/13/2019, 8:01 PM

## 2019-08-13 NOTE — Progress Notes (Signed)
OT Cancellation Note  Patient Details Name: Devon Peterson MRN: [id number] DOB: [birth year]   Cancelled Treatment:    Reason Eval/Treat Not Completed: Fatigue/lethargy limiting ability to participate (Patient reports fatigue after radiation and declines therapy today.)  Lenward Chancellor 08/13/2019, 4:50 PM

## 2019-08-14 ENCOUNTER — Ambulatory Visit
Admit: 2019-08-14 | Discharge: 2019-08-14 | Disposition: A | Discharge status: Home / Self Care | Payer: Medicaid Other | Attending: Radiation Oncology | Admitting: Radiation Oncology

## 2019-08-14 ENCOUNTER — Inpatient Hospital Stay: Payer: Medicaid Other | Admitting: Internal Medicine

## 2019-08-14 LAB — GLUCOSE, CAPILLARY
Glucose-Capillary: 148 mg/dL — ABNORMAL HIGH (ref 70–99)
Glucose-Capillary: 79 mg/dL (ref 70–99)
Glucose-Capillary: 87 mg/dL (ref 70–99)

## 2019-08-14 NOTE — TOC Progression Note (Signed)
Transition of Care [facility]) - Progression Note    Patient Details  Name: Devon Peterson MRN: [id number] Date of Birth: September 16, 1955  Transition of Care [facility]) CM/SW Contact  Purcell Mouton, RN Phone Number: 08/14/2019, 11:27 AM  Clinical Narrative:    Pt will not be able to go to SNF until after completion of radiation treatment on 6/18 if there is bed availability. Pt will need a COVID test 6/17 prior to discharge.   Expected Discharge Plan: Skilled Nursing Facility Barriers to Discharge: Continued Medical Work up  Expected Discharge Plan and Services Expected Discharge Plan: [place] Acute Care Choice: [place]                                         Social Determinants of Health (SDOH) Interventions    Readmission Risk Interventions Readmission Risk Prevention Plan 07/27/2019  Transportation Screening Complete  HRI or [place] Complete  Social Work Consult for [place] Planning/Counseling Complete  Palliative Care Screening Complete  Medication Review [employer]) Complete  Some recent data might be hidden

## 2019-08-14 NOTE — Progress Notes (Addendum)
HEMATOLOGY-ONCOLOGY PROGRESS NOTE  SUBJECTIVE: Continues on radiation.  Scheduled to complete radiation on 08/21/2019.  TOC team and hospitalist anticipate discharge to SNF the same day of radiation completion.  Location to be determined.  PHYSICAL EXAMINATION:  Vitals:   08/14/19 1115 08/14/19 1134  BP: 97/79   Pulse: (!) 104 93  Resp: 16   Temp:    SpO2: 100%    Filed Weights   07/09/19 0949 07/20/19 1600 08/10/19 1225  Weight: 42 kg 44 kg 42.2 kg    Intake/Output from previous day: 06/10 0701 - 06/11 0700 In: 2947 [NG/GT:1428] Out: 654 [Urine:775]  General:  Severe cachexia, temporal muscle wasting noted.      Neck: tracheostomy midline , oropharynx without thrush or ulcers Respiratory: Diminished breath sounds.   Cardiovascular: Regular rate and rhythm, no lower extremity edema GI:  abdomen was soft, flat, nontender, nondistended, without organomegaly.    PEG tube without erythema or drainage.   Neuro:  Awake and alert.    LABORATORY DATA:  I have reviewed the data as listed CMP Latest Ref Rng & Units 08/12/2019 08/11/2019 08/08/2019  Glucose 70 - 99 mg/dL 81 89 88  BUN 8 - 23 mg/dL 22 19 20   Creatinine 0.61 - 1.24 mg/dL 0.41(L) 0.46(L) 0.37(L)  Sodium 135 - 145 mmol/L 134(L) 134(L) 135  Potassium 3.5 - 5.1 mmol/L 4.5 4.8 4.4  Chloride 98 - 111 mmol/L 96(L) 96(L) 94(L)  CO2 22 - 32 mmol/L 27 24 34(H)  Calcium 8.9 - 10.3 mg/dL 9.1 9.1 9.1  Total Protein 6.5 - 8.1 g/dL 7.0 - -  Total Bilirubin 0.3 - 1.2 mg/dL 0.3 - -  Alkaline Phos 38 - 126 U/L 66 - -  AST 15 - 41 U/L 20 - -  ALT 0 - 44 U/L 16 - -    Lab Results  Component Value Date   WBC 4.3 08/12/2019   HGB 10.1 (L) 08/12/2019   HCT 31.4 (L) 08/12/2019   MCV 86.3 08/12/2019   PLT 275 08/12/2019   NEUTROABS 3.0 08/12/2019    DG Chest Port 1 View  Result Date: 07/31/2019 CLINICAL DATA:  Respiratory failure EXAM: PORTABLE CHEST 1 VIEW COMPARISON:  07/29/2019 FINDINGS: No significant change in AP portable  examination. Tracheostomy. There is probable left basilar atelectasis or consolidation and associated layering pleural effusion. No new airspace opacity. The heart and mediastinum are unremarkable. IMPRESSION: 1. No significant change in AP portable examination. There is probable left basilar atelectasis or consolidation and associated layering pleural effusion. No new airspace opacity. 2.  Tracheostomy. Electronically Signed   By: Eddie Candle M.D.   On: 07/31/2019 08:16   DG Chest Port 1 View  Result Date: 07/29/2019 CLINICAL DATA:  Respiratory failure. EXAM: PORTABLE CHEST 1 VIEW COMPARISON:  07/28/2019 FINDINGS: The tracheostomy tube is stable. The cardiac silhouette, mediastinal and hilar contours are within normal limits and unchanged. Stable hyperinflation and emphysematous changes. Persistent patchy bibasilar infiltrates, left greater than right. IMPRESSION: Persistent bibasilar infiltrates, left greater than right. Electronically Signed   By: Marijo Sanes M.D.   On: 07/29/2019 09:12   Portable Chest xray  Result Date: 07/28/2019 CLINICAL DATA:  Respiratory insufficiency EXAM: PORTABLE CHEST 1 VIEW COMPARISON:  Yesterday FINDINGS: Extensive airspace disease with volume loss on the left. There was airspace opacity and airway obstruction by recent chest CT. Milder infiltrate at the right base. The lungs are hyperinflated. Normal heart size. Tracheostomy tube in place. IMPRESSION: Unchanged pneumonia and volume loss on the left.  Electronically Signed   By: Monte Fantasia M.D.   On: 07/28/2019 07:53   DG CHEST PORT 1 VIEW  Result Date: 07/27/2019 CLINICAL DATA:  Tracheostomy, respiratory failure EXAM: PORTABLE CHEST 1 VIEW COMPARISON:  07/26/2019 FINDINGS: Tracheostomy in satisfactory position. Patient is rotated. Mild left perihilar/infrahilar soft tissue prominence, favoring pneumonia. Small left pleural effusion. Right lung is clear, noting minimal right basilar atelectasis versus pneumonia. No  pneumothorax. The heart is normal in size. IMPRESSION: Suspected left upper and lower lobe pneumonia. Small left pleural effusion. Mild right basilar atelectasis versus pneumonia. Electronically Signed   By: Julian Hy M.D.   On: 07/27/2019 13:56   DG CHEST PORT 1 VIEW  Result Date: 07/26/2019 CLINICAL DATA:  Shortness of breath, tracheostomy EXAM: PORTABLE CHEST 1 VIEW COMPARISON:  07/25/2019 FINDINGS: The heart size and mediastinal contours are within normal limits. Tracheostomy. Possible small layering pleural effusions. The visualized skeletal structures are unremarkable. IMPRESSION: Possible small layering pleural effusions. No acute appearing airspace opacity. Electronically Signed   By: Eddie Candle M.D.   On: 07/26/2019 15:17   DG CHEST PORT 1 VIEW  Result Date: 07/25/2019 CLINICAL DATA:  Shortness of breath EXAM: PORTABLE CHEST 1 VIEW COMPARISON:  Two days ago FINDINGS: Hazy opacity of the bilateral lower chest which includes pleural fluid. There was lower lobe pneumonia by recent chest CT. Tracheostomy tube and percutaneous gastrostomy tube in place. Normal heart size. IMPRESSION: Stable hazy bilateral lower chest opacification correlating with pneumonia on recent chest CT. There is also layering pleural fluid. Electronically Signed   By: Monte Fantasia M.D.   On: 07/25/2019 07:18   DG CHEST PORT 1 VIEW  Result Date: 07/23/2019 CLINICAL DATA:  Shortness of breath. EXAM: PORTABLE CHEST 1 VIEW COMPARISON:  Chest x-ray 07/23/2019 FINDINGS: The tracheostomy tube is stable. The cardiac silhouette, mediastinal and hilar contours are within normal limits. There are bilateral pleural effusions and bibasilar atelectasis. Much improved left lower lobe aeration with resolving pneumonia. IMPRESSION: Persistent bilateral pleural effusions and bibasilar atelectasis. Much improved left lower lobe lung aeration with resolving pneumonia. Electronically Signed   By: Marijo Sanes M.D.   On: 07/23/2019  08:03    ASSESSMENT AND PLAN: 1.  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  Left lower lobe of the lung mass concerning for primary neoplasm versus metastatic disease             -07/07/2019 CT angiogram of the chest 2.7 cm mass in the left lower lobe of the lung 3.  Cachexia 4.  Aspiration pneumonia 5.  Mild anemia 6.  Atrial fibrillation with RVR 6.  COPD 8.  Alcohol abuse 9.  Tobacco dependence 10.  Cocaine abuse 11.  Odynophagia secondary to radiation  Devon Peterson appears unchanged.  Continues on radiation and is scheduled to complete radiation on 08/21/2019.  Thereafter, he will be discharged to SNF.  Location is still to be determined.  On admission, CT scan showed a possible mass in his lung.  This will need follow-up with a repeat CT scan of the chest. He complains of "throat  pain ", likely developing mucositis from radiation. Recommendations: 1.  Continue radiation. 2.  I have ordered a CT of the chest with contrast to be performed on Monday, 08/17/2019.  I have also ordered a repeat chemistry to recheck his renal function prior to that CT scan. 3.  Narcotic analgesic as needed for the throat pain  Please call medical oncology over the weekend for questions.  We will plan to follow-up next week to review the CT scan.   LOS: 38 days   Mikey Bussing, [place], AGPCNP-BC, AOCNP 08/14/19   Devon Peterson was interviewed and examined.  He appears stable.  We will order a CT of the chest for next week to follow-up on the possible left lung mass.  This scan will be at a 6-week interval.  He is completing radiation to the neck.  I explained the plan for a CT and further evaluation if a  lung mass is confirmed.

## 2019-08-14 NOTE — Progress Notes (Signed)
PROGRESS NOTE  Giovany Cosby  [id number] DOB: [birth year] DOA: 07/07/2019 PCP: Patient, No Pcp Per   Brief Narrative: 64 year old male with history of COPD, cocaine abuse, tobacco abuse presented on Jul 07, 2019 with aspiration pneumonia and was found to have hypopharyngeal mass.  He had direct laryngoscopy with biopsy and tracheostomy on Jul 09, 2019.  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  He has had a prolonged hospitalization.  He had a G-tube placed on Jul 14, 2019.  He had some tracheostomy bleeding on Jul 26, 2019 which resolved without any intervention. On Jul 27, 2019, he was transferred to ICU for respiratory distress; placed on ventilator.  On Jul 30, 2019, he had episode of atrial flutter which responded to amiodarone boluses only.  Subsequently, he has been off of mechanical ventilation.  He was transferred back to [facility] service on 08/05/19  Assessment & Plan: Principal Problem:   Aspiration pneumonia ([place]) Active Problems:   Sepsis ([place])   Dysphagia   Severe protein-calorie malnutrition ([place])   Alcohol use   Pressure injury of skin   Lung mass   Neck mass   Tracheostomy care ([place])   Squamous cell carcinoma of neck   Palliative care encounter   Acute respiratory insufficiency  Acute hypoxic respiratory failure, currently trach-dependent Aspiration pneumonia: Treated with antibiotics - Changed to #6 cuffed trach by ENT on Jul 27, 2019.  Patient tolerated trach collar since a.m. of Jul 31, 2019 and vent was discontinued on Aug 02, 2019. - Transferred back to [facility] service on 08/05/2019 - Trach care as per pulmonary. - Wean oxygen as able: Currently requiring 2 L oxygen. - Patient is not a candidate for decannulation because of laryngeal cancer - Currently hemodynamically stable with no temperature spikes. Will plan to check routine labs again 6/14.  Squamous cell carcinoma of larynx Left lower lung mass -Currently undergoing radiation therapy with  plans for radiation treatments till 08/21/2019.  Oncology following intermittently, planning CT chest 6/14.   Dysphagia Cachexia Severe protein calorie malnutrition Generalized conditioning -Continue tube feeds via G-tube.  G-tube placed on 07/14/2019 -Overall prognosis is guarded to poor.  Patient remains full code.  Palliative care has evaluated patient as well during the hospitalization. No evidence of current infection.  Hypomagnesemia -Replace.  Repeat a.m. labs  COPD with emphysema -Continue supplemental oxygen.  Continue nebs  Anemia of critical illness, iron deficiency, chronic disease -Hemoglobin stable.  Monitor intermittently.  Paroxysmal/transient A. Fib/flutter -Had required amiodarone boluses briefly.  Currently rate controlled.  Continue metoprolol  Stage II sacral pressure ulcer -Present on admission -Continue wound care  Depression -Continue Zoloft   Stage II pressure injuries to mid sacrum: Present on admission -3 separate areas.  Continue wound care Pressure Injury 07/07/19 Sacrum Mid;Upper Stage 2 -  Partial thickness loss of dermis presenting as a shallow open injury with a red, pink wound bed without slough. 3 each small separate open areas (Active)  07/07/19 2107  Location: Sacrum  Location Orientation: Mid;Upper  Staging: Stage 2 -  Partial thickness loss of dermis presenting as a shallow open injury with a red, pink wound bed without slough.  Wound Description (Comments): 3 each small separate open areas  Present on Admission: Yes   Severe protein calorie malnutrition: BMI 12.  - Supplement protein as able.  DVT prophylaxis: SCDs Code Status: Full Family Communication: None at bedside Disposition Plan:  Status is: Inpatient  Remains inpatient appropriate because:Unsafe d/c plan   Dispo: The  patient is from: Home              Anticipated d/c is to: SNF              Anticipated d/c date is: 08/21/2019              Patient currently is  not medically stable to d/c.  Consultants:   PCCM, ENT, oncology, palliative care, radiation oncology.   Procedures:  Intubation Tracheostomy on 07/09/2019 G-tube on 07/14/2019  Antimicrobials: Unasyn from 07/07/2019-07/14/2019  Subjective: "Feel the same." Still with weakness, no chest pain.  Objective: Vitals:   08/14/19 1115 08/14/19 1134 08/14/19 1232 08/14/19 1500  BP: 97/79  108/82 107/73  Pulse: (!) 104 93 75   Resp: 16   (!) 22  Temp:   (!) 96.6 F (35.9 C) 97.7 F (36.5 C)  TempSrc:   Axillary Axillary  SpO2: 100%  100% 99%  Weight:      Height:        Intake/Output Summary (Last 24 hours) at 08/14/2019 1509 Last data filed at 08/13/2019 1851 Gross per 24 hour  Intake 1428 ml  Output 775 ml  Net 653 ml   Filed Weights   07/09/19 0949 07/20/19 1600 08/10/19 1225  Weight: 42 kg 44 kg 42.2 kg   Gen: 64 y.o. male in no distress Pulm: Nonlabored breathing, transmitted upper airway sounds cleared after suction, no wheezes or crackles.  CV: Regular rate and rhythm. No murmur, rub, or gallop. No JVD, no dependent edema. GI: Abdomen soft, non-tender, non-distended, with normoactive bowel sounds.  Ext: Warm, no deformities. Very cachectic. Skin: No rashes, lesions or ulcers on visualized skin. Neuro: Alert and oriented. No focal neurological deficits. Psych: Judgement and insight appear fair. Mood euthymic & affect congruent. Behavior is appropriate.    Data Reviewed: I have personally reviewed following labs and imaging studies  CBC: Recent Labs  Lab 08/08/19 0300 08/11/19 0523 08/12/19 0417  WBC 3.9* 4.7 4.3  NEUTROABS  --  3.2 3.0  HGB 9.2* 10.5* 10.1*  HCT 28.7* 31.8* 31.4*  MCV 88.3 84.4 86.3  PLT 319 280 397   Basic Metabolic Panel: Recent Labs  Lab 08/08/19 0300 08/11/19 0523 08/12/19 0417  NA 135 134* 134*  K 4.4 4.8 4.5  CL 94* 96* 96*  CO2 34* 24 27  GLUCOSE 88 89 81  BUN '20 19 22  '$ CREATININE 0.37* 0.46* 0.41*  CALCIUM 9.1 9.1 9.1  MG   --  1.5* 1.8   GFR: Estimated Creatinine Clearance: 55.7 mL/min (A) (by C-G formula based on SCr of 0.41 mg/dL (L)). Liver Function Tests: Recent Labs  Lab 08/12/19 0417  AST 20  ALT 16  ALKPHOS 66  BILITOT 0.3  PROT 7.0  ALBUMIN 3.2*   No results for input(s): LIPASE, AMYLASE in the last 168 hours. No results for input(s): AMMONIA in the last 168 hours. Coagulation Profile: No results for input(s): INR, PROTIME in the last 168 hours. Cardiac Enzymes: No results for input(s): CKTOTAL, CKMB, CKMBINDEX, TROPONINI in the last 168 hours. BNP (last 3 results) No results for input(s): PROBNP in the last 8760 hours. HbA1C: No results for input(s): HGBA1C in the last 72 hours. CBG: Recent Labs  Lab 08/13/19 1105 08/13/19 1707 08/13/19 2043 08/14/19 0005 08/14/19 0747  GLUCAP 118* 111* 118* 148* 79   Lipid Profile: No results for input(s): CHOL, HDL, LDLCALC, TRIG, CHOLHDL, LDLDIRECT in the last 72 hours. Thyroid Function Tests: No results for input(s):  TSH, T4TOTAL, FREET4, T3FREE, THYROIDAB in the last 72 hours. Anemia Panel: No results for input(s): VITAMINB12, FOLATE, FERRITIN, TIBC, IRON, RETICCTPCT in the last 72 hours. Urine analysis:    Component Value Date/Time   COLORURINE YELLOW 07/07/2019 1729   APPEARANCEUR HAZY (A) 07/07/2019 1729   LABSPEC 1.016 07/07/2019 1729   PHURINE 5.0 07/07/2019 1729   GLUCOSEU NEGATIVE 07/07/2019 1729   HGBUR NEGATIVE 07/07/2019 1729   BILIRUBINUR NEGATIVE 07/07/2019 1729   KETONESUR NEGATIVE 07/07/2019 1729   PROTEINUR 30 (A) 07/07/2019 1729   UROBILINOGEN 1.0 10/28/2008 0458   NITRITE NEGATIVE 07/07/2019 1729   LEUKOCYTESUR NEGATIVE 07/07/2019 1729   No results found for this or any previous visit (from the past 240 hour(s)).    Radiology Studies: No results found.  Scheduled Meds: . ascorbic acid  100 mg Per Tube BID  . chlorhexidine gluconate (MEDLINE KIT)  15 mL Mouth Rinse BID  . Chlorhexidine Gluconate Cloth  6  each Topical Daily  . docusate  100 mg Oral BID  . feeding supplement (OSMOLITE 1.5 CAL)  237 mL Per Tube 5 X Daily  . feeding supplement (PRO-STAT SUGAR FREE 64)  30 mL Per Tube BID  . ferrous sulfate  300 mg Per Tube BID WC  . free water  120 mL Per Tube 5 X Daily  . mouth rinse  15 mL Mouth Rinse 10 times per day  . metoprolol tartrate  25 mg Per Tube BID  . pantoprazole sodium  40 mg Per Tube Q24H  . polyethylene glycol  17 g Oral Daily  . senna-docusate  1 tablet Oral BID  . sertraline  50 mg Per Tube Daily  . thiamine  100 mg Per Tube Daily   Continuous Infusions:   LOS: 38 days   Time spent: 15 minutes.  Patrecia Pour, MD [facility] [email or web address] 08/14/2019, 3:09 PM

## 2019-08-14 NOTE — Progress Notes (Signed)
Physical Therapy Treatment Patient Details Name: Devon Peterson MRN: [id number] DOB: [birth year] Today's Date: 08/14/2019    History of Present Illness Significant Hospital Events 5/04 admitted5/05 seen by ENT as initial evaluation underwent flexible laryngoscopy5/06 to operating room for direct laryngoscopy and biopsy, and tracheostomy placement pulmonary asked to evaluate postop for hypotension5/11 G tube placed5/23 tracheostomy bleeding >> no interventions needs5/24 transfer to ICU for respiratory distress; placed on ventilator and changed to #4 cuffed trach (unable to pass #6 cuffed trach); ENT changed to #6 cuffed trach5/27 episode of a flutter >  Amiodarone boluses only 5/31: Off from mechanical ventilation for 48 hours6/1: Getting up with physical therapy    PT Comments    Pt agreeable to therapy today but fatigues easily.  Only able to tolerate a few exercises with max encouragement and transfer to chair.  Pt did have improved BP today with transfers.  All VSS on 28% FiO2 trach collar.  Cont POC.     Follow Up Recommendations  SNF     Equipment Recommendations  None recommended by PT    Recommendations for Other Services       Precautions / Restrictions Precautions Precautions: Fall Precaution Comments: TRACH 28% (5 LPM), PEG, orthostatic    Mobility  Bed Mobility Overal bed mobility: Needs Assistance Bed Mobility: Supine to Sit     Supine to sit: Min assist     General bed mobility comments: increased time, cues to scoot forward  Transfers Overall transfer level: Needs assistance Equipment used: 2 person hand held assist Transfers: Sit to/from [facility] Sit to Stand: Min assist;+2 safety/equipment Stand pivot transfers: Min assist;+2 safety/equipment       General transfer comment: Had assist of 2 due to hx of orthostatic hypotension; min A to power up; cues for steps to chair  Ambulation/Gait Ambulation/Gait assistance: Min assist;+2  safety/equipment Gait Distance (Feet): 3 Feet Assistive device: 2 person hand held assist Gait Pattern/deviations: Step-to pattern;Trunk flexed Gait velocity: decr   General Gait Details: steps to chair; limited by fatigue; cued for posture   Stairs             Wheelchair Mobility    Modified Rankin (Stroke Patients Only)       Balance   Sitting-balance support: Bilateral upper extremity supported;Feet supported Sitting balance-Leahy Scale: Good Sitting balance - Comments: Uses UE due to fatigue   Standing balance support: Bilateral upper extremity supported;During functional activity Standing balance-Leahy Scale: Poor                              Cognition Arousal/Alertness: Awake/alert Behavior During Therapy: Flat affect Overall Cognitive Status: Within Functional Limits for tasks assessed                                 General Comments: covers trach to communicate; minimal communication - even limited head nods      Exercises General Exercises - Lower Extremity Ankle Circles/Pumps: AROM;Both;10 reps;Seated Long Arc Quad: AROM;5 reps;Seated (max encouragment)    General Comments General comments (skin integrity, edema, etc.): Pt on 28% FiO2 trach collar with sats 98%.  Orthostatic BPs  Supine 111/78 and HR 95  Sitting 89/70 and HR 98  Sitting after 3 min 95/78 and HR 100  Unable to stand long enough for BP but asymptomatic   BP after immediate tx to chair 97/79 and HR  110 bpm         Pertinent Vitals/Pain Pain Assessment: No/denies pain    Home Living                      Prior Function            PT Goals (current goals can now be found in the care plan section) Progress towards PT goals: Progressing toward goals    Frequency    Min 2X/week      PT Plan Current plan remains appropriate    Co-evaluation PT/OT/SLP Co-Evaluation/Treatment: Yes Reason for Co-Treatment: Complexity of the patient's  impairments (multi-system involvement);Other (comment) (hx of orthostatic hypotension; low tolerance for activity) PT goals addressed during session: Mobility/safety with mobility OT goals addressed during session: ADL's and self-care      AM-PAC PT "6 Clicks" Mobility   Outcome Measure  Help needed turning from your back to your side while in a flat bed without using bedrails?: A Little Help needed moving from lying on your back to sitting on the side of a flat bed without using bedrails?: A Little Help needed moving to and from a bed to a chair (including a wheelchair)?: A Little Help needed standing up from a chair using your arms (e.g., wheelchair or bedside chair)?: A Little Help needed to walk in hospital room?: A Lot Help needed climbing 3-5 steps with a railing? : Total 6 Click Score: 15    End of Session Equipment Utilized During Treatment: Oxygen Activity Tolerance: Patient tolerated treatment well Patient left: with call bell/phone within reach;with chair alarm set;in chair Nurse Communication: Mobility status;Other (comment) (improved BPs) PT Visit Diagnosis: Muscle weakness (generalized) (M62.81);Difficulty in walking, not elsewhere classified (R26.2)     Time: 4098-1191 PT Time Calculation (min) (ACUTE ONLY): 20 min  Charges:                        Abran Richard, PT Acute Rehab Services Pager [phone] [facility] [place] 08/14/2019, 11:03 AM

## 2019-08-14 NOTE — Progress Notes (Signed)
Occupational Therapy Treatment Patient Details Name: Devon Peterson MRN: [id number] DOB: [birth year] Today's Date: 08/14/2019    History of present illness Significant Hospital Events 5/04 admitted5/05 seen by ENT as initial evaluation underwent flexible laryngoscopy5/06 to operating room for direct laryngoscopy and biopsy, and tracheostomy placement pulmonary asked to evaluate postop for hypotension5/11 G tube placed5/23 tracheostomy bleeding >> no interventions needs5/24 transfer to ICU for respiratory distress; placed on ventilator and changed to #4 cuffed trach (unable to pass #6 cuffed trach); ENT changed to #6 cuffed trach5/27 episode of a flutter >  Amiodarone boluses only 5/31: Off from mechanical ventilation for 48 hours6/1: Getting up with physical therapy   OT comments  Patient agreeable to transfer to chair, min A x2 hand held assist for safety, pt limited by fatigue, will continue to follow.  BP in bed 111/78 EOB 89/70, after ~2 mins EOB 95/78 In chair 97/79   Follow Up Recommendations  LTACH;SNF    Equipment Recommendations  Other (comment) (TBD)       Precautions / Restrictions Precautions Precautions: Fall Precaution Comments: TRACH 28% (5 LPM), PEG, orthostatic       Mobility Bed Mobility Overal bed mobility: Needs Assistance Bed Mobility: Supine to Sit Rolling: Supervision   Supine to sit: Min assist     General bed mobility comments: increased time, cues to scoot forward  Transfers Overall transfer level: Needs assistance Equipment used: 2 person hand held assist Transfers: Sit to/from [facility] Sit to Stand: Min assist;+2 safety/equipment Stand pivot transfers: Min assist;+2 safety/equipment       General transfer comment: Had assist of 2 due to hx of orthostatic hypotension; min A to power up; cues for steps to chair    Balance Overall balance assessment: Needs assistance Sitting-balance support: Bilateral upper extremity  supported;Feet supported Sitting balance-Leahy Scale: Good Sitting balance - Comments: Uses UE due to fatigue   Standing balance support: Bilateral upper extremity supported;During functional activity Standing balance-Leahy Scale: Poor Standing balance comment: reliant on B UE external support                           ADL either performed or assessed with clinical judgement   ADL Overall ADL's : Needs assistance/impaired     Grooming: Wash/dry face;Set up;Sitting                   Toilet Transfer: Minimal assistance;+2 for physical assistance;+2 for safety/equipment (hand held assist) Toilet Transfer Details (indicate cue type and reason): Bed>recliner 5 feet away                           Cognition Arousal/Alertness: Awake/alert Behavior During Therapy: Flat affect Overall Cognitive Status: Within Functional Limits for tasks assessed                                 General Comments: covers trach to communicate; minimal communication - even limited head nods              General Comments Pt on 28% FiO2 trach collar with sats 98%.    Pertinent Vitals/ Pain       Pain Assessment: Faces Faces Pain Scale: Hurts a little bit Pain Location: abdominal pain from PEG placement Pain Descriptors / Indicators: Sore Pain Intervention(s): Monitored during session         Frequency  Min 2X/week        Progress Toward Goals  OT Goals(current goals can now be found in the care plan section)  Progress towards OT goals: Progressing toward goals  Acute Rehab OT Goals Patient Stated Goal: agreed to mobility OT Goal Formulation: With patient Time For Goal Achievement: 08/23/19 Potential to Achieve Goals: Fair ADL Goals Pt Will Perform Lower Body Dressing: sit to/from stand;sitting/lateral leans;with min guard assist Pt Will Transfer to Toilet: with min guard assist;ambulating;bedside commode Pt Will Perform Toileting - Clothing  Manipulation and hygiene: with min guard assist;sitting/lateral leans;sit to/from stand Pt/caregiver will Perform Home Exercise Program: Increased strength;Both right and left upper extremity;Independently  Plan Discharge plan remains appropriate    Co-evaluation    PT/OT/SLP Co-Evaluation/Treatment: Yes Reason for Co-Treatment: For patient/therapist safety;To address functional/ADL transfers PT goals addressed during session: Mobility/safety with mobility OT goals addressed during session: ADL's and self-care      AM-PAC OT "6 Clicks" Daily Activity     Outcome Measure   Help from another person eating meals?: None (NPO but performs hand to mouth for suctioning) Help from another person taking care of personal grooming?: A Little Help from another person toileting, which includes using toliet, bedpan, or urinal?: Total Help from another person bathing (including washing, rinsing, drying)?: Total Help from another person to put on and taking off regular upper body clothing?: Total Help from another person to put on and taking off regular lower body clothing?: Total 6 Click Score: 11    End of Session    OT Visit Diagnosis: Unsteadiness on feet (R26.81);Other abnormalities of gait and mobility (R26.89);Muscle weakness (generalized) (M62.81)   Activity Tolerance Patient limited by fatigue   Patient Left in chair;with call bell/phone within reach;with nursing/sitter in room;with chair alarm set   Nurse Communication Mobility status        Time: 4163-8453 OT Time Calculation (min): 20 min  Charges: OT General Charges $OT Visit: 1 Visit OT Treatments $Self Care/Home Management : 8-22 mins  Delbert Phenix OT Pager: [place] 08/14/2019, 2:42 PM

## 2019-08-15 LAB — GLUCOSE, CAPILLARY
Glucose-Capillary: 105 mg/dL — ABNORMAL HIGH (ref 70–99)
Glucose-Capillary: 85 mg/dL (ref 70–99)

## 2019-08-15 NOTE — Progress Notes (Signed)
PROGRESS NOTE  Devon Peterson  [id number] DOB: 11/13/1955 DOA: 07/07/2019 PCP: Patient, No Pcp Per   Brief Narrative: 64 year old male with history of COPD, cocaine abuse, tobacco abuse presented on Jul 07, 2019 with aspiration pneumonia and was found to have hypopharyngeal mass.  He had direct laryngoscopy with biopsy and tracheostomy on Jul 09, 2019.  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  He has had a prolonged hospitalization.  He had a G-tube placed on Jul 14, 2019.  He had some tracheostomy bleeding on Jul 26, 2019 which resolved without any intervention. On Jul 27, 2019, he was transferred to ICU for respiratory distress; placed on ventilator.  On Jul 30, 2019, he had episode of atrial flutter which responded to amiodarone boluses only.  Subsequently, he has been off of mechanical ventilation.  He was transferred back to [facility] service on 08/05/19  Assessment & Plan: Principal Problem:   Aspiration pneumonia ([place]) Active Problems:   Sepsis ([place])   Dysphagia   Severe protein-calorie malnutrition ([place])   Alcohol use   Pressure injury of skin   Lung mass   Neck mass   Tracheostomy care ([place])   Squamous cell carcinoma of neck   Palliative care encounter   Acute respiratory insufficiency  Acute hypoxic respiratory failure, currently trach-dependent Aspiration pneumonia: Treated with antibiotics - Changed to #6 cuffed trach by ENT on Jul 27, 2019.  Patient tolerated trach collar since a.m. of Jul 31, 2019 and vent was discontinued on Aug 02, 2019. - Transferred back to [facility] service on 08/05/2019 - Trach care as per pulmonary. - Wean oxygen as able: Currently requiring 5L oxygen but 100%, improved when suctioning frequently. - Patient is not a candidate for decannulation because of laryngeal cancer - Currently hemodynamically stable with no temperature spikes. Will plan to check routine labs again 6/14 including CMP, CBC.  Squamous cell carcinoma of larynx Left  lower lung mass -Currently undergoing radiation therapy with plans for radiation treatments till 08/21/2019.  Oncology following intermittently, planning CT chest 6/14.   Dysphagia Cachexia Severe protein calorie malnutrition Generalized conditioning -Continue tube feeds via G-tube (placed 07/14/2019).  -Can stop checking CBGs. These have been within goal range for many days with no insulin required and no hypoglycemia noted. -Overall prognosis is guarded to poor.  Patient remains full code.  Palliative care has evaluated patient as well during the hospitalization. No evidence of current infection.  Hypomagnesemia -Replace.  Repeat a.m. labs  COPD with emphysema -Continue supplemental oxygen.  Continue nebs  Anemia of critical illness, iron deficiency, chronic disease -Hemoglobin stable.  Monitor intermittently.  Paroxysmal/transient A. Fib/flutter -Had required amiodarone boluses briefly.  Currently rate controlled.  Continue metoprolol  Stage II sacral pressure ulcer -Present on admission -Continue wound care  Depression -Continue Zoloft   Stage II pressure injuries to mid sacrum: Present on admission -3 separate areas.  Continue wound care Pressure Injury 07/07/19 Sacrum Mid;Upper Stage 2 -  Partial thickness loss of dermis presenting as a shallow open injury with a red, pink wound bed without slough. 3 each small separate open areas (Active)  07/07/19 2107  Location: Sacrum  Location Orientation: Mid;Upper  Staging: Stage 2 -  Partial thickness loss of dermis presenting as a shallow open injury with a red, pink wound bed without slough.  Wound Description (Comments): 3 each small separate open areas  Present on Admission: Yes   Severe protein calorie malnutrition: BMI 12.  - Supplement protein as able.  DVT  prophylaxis: SCDs Code Status: Full Family Communication: None at bedside Disposition Plan:  Status is: Inpatient  Remains inpatient appropriate  because:Unsafe d/c plan   Dispo: The patient is from: Home              Anticipated d/c is to: SNF              Anticipated d/c date is: 08/21/2019              Patient currently is not medically stable to d/c.  Consultants:   PCCM, ENT, oncology, palliative care, radiation oncology.   Procedures:  Intubation Tracheostomy on 07/09/2019 G-tube on 07/14/2019  Antimicrobials: Unasyn from 07/07/2019-07/14/2019  Subjective: No changes over past 24 hours. No shortness of breath or pain.  Objective: Vitals:   08/14/19 2359 08/15/19 0407 08/15/19 0419 08/15/19 0855  BP:   108/75   Pulse: 78 75  76  Resp: '18 18  18  '$ Temp:   98 F (36.7 C)   TempSrc:   Oral   SpO2: 99% 98%    Weight:      Height:        Intake/Output Summary (Last 24 hours) at 08/15/2019 0913 Last data filed at 08/15/2019 0422 Gross per 24 hour  Intake --  Output 1175 ml  Net -1175 ml   Filed Weights   07/09/19 0949 07/20/19 1600 08/10/19 1225  Weight: 42 kg 44 kg 42.2 kg   Gen: Cachectic male in no distress Pulm: Nonlabored. Cleared crackles, no wheezes. Neuro: Alert and oriented. No focal neurological deficits.  Data Reviewed: I have personally reviewed following labs and imaging studies  CBC: Recent Labs  Lab 08/11/19 0523 08/12/19 0417  WBC 4.7 4.3  NEUTROABS 3.2 3.0  HGB 10.5* 10.1*  HCT 31.8* 31.4*  MCV 84.4 86.3  PLT 280 989   Basic Metabolic Panel: Recent Labs  Lab 08/11/19 0523 08/12/19 0417  NA 134* 134*  K 4.8 4.5  CL 96* 96*  CO2 24 27  GLUCOSE 89 81  BUN 19 22  CREATININE 0.46* 0.41*  CALCIUM 9.1 9.1  MG 1.5* 1.8   GFR: Estimated Creatinine Clearance: 55.7 mL/min (A) (by C-G formula based on SCr of 0.41 mg/dL (L)). Liver Function Tests: Recent Labs  Lab 08/12/19 0417  AST 20  ALT 16  ALKPHOS 66  BILITOT 0.3  PROT 7.0  ALBUMIN 3.2*   No results for input(s): LIPASE, AMYLASE in the last 168 hours. No results for input(s): AMMONIA in the last 168  hours. Coagulation Profile: No results for input(s): INR, PROTIME in the last 168 hours. Cardiac Enzymes: No results for input(s): CKTOTAL, CKMB, CKMBINDEX, TROPONINI in the last 168 hours. BNP (last 3 results) No results for input(s): PROBNP in the last 8760 hours. HbA1C: No results for input(s): HGBA1C in the last 72 hours. CBG: Recent Labs  Lab 08/14/19 0005 08/14/19 0747 08/14/19 1609 08/15/19 0004 08/15/19 0409  GLUCAP 148* 79 87 105* 85   Lipid Profile: No results for input(s): CHOL, HDL, LDLCALC, TRIG, CHOLHDL, LDLDIRECT in the last 72 hours. Thyroid Function Tests: No results for input(s): TSH, T4TOTAL, FREET4, T3FREE, THYROIDAB in the last 72 hours. Anemia Panel: No results for input(s): VITAMINB12, FOLATE, FERRITIN, TIBC, IRON, RETICCTPCT in the last 72 hours. Urine analysis:    Component Value Date/Time   COLORURINE YELLOW 07/07/2019 1729   APPEARANCEUR HAZY (A) 07/07/2019 1729   LABSPEC 1.016 07/07/2019 1729   PHURINE 5.0 07/07/2019 1729   GLUCOSEU NEGATIVE 07/07/2019 1729  HGBUR NEGATIVE 07/07/2019 1729   BILIRUBINUR NEGATIVE 07/07/2019 1729   KETONESUR NEGATIVE 07/07/2019 1729   PROTEINUR 30 (A) 07/07/2019 1729   UROBILINOGEN 1.0 10/28/2008 0458   NITRITE NEGATIVE 07/07/2019 1729   LEUKOCYTESUR NEGATIVE 07/07/2019 1729   No results found for this or any previous visit (from the past 240 hour(s)).    Radiology Studies: No results found.  Scheduled Meds: . ascorbic acid  100 mg Per Tube BID  . chlorhexidine gluconate (MEDLINE KIT)  15 mL Mouth Rinse BID  . Chlorhexidine Gluconate Cloth  6 each Topical Daily  . docusate  100 mg Oral BID  . feeding supplement (OSMOLITE 1.5 CAL)  237 mL Per Tube 5 X Daily  . feeding supplement (PRO-STAT SUGAR FREE 64)  30 mL Per Tube BID  . ferrous sulfate  300 mg Per Tube BID WC  . free water  120 mL Per Tube 5 X Daily  . mouth rinse  15 mL Mouth Rinse 10 times per day  . metoprolol tartrate  25 mg Per Tube BID  .  pantoprazole sodium  40 mg Per Tube Q24H  . polyethylene glycol  17 g Oral Daily  . senna-docusate  1 tablet Oral BID  . sertraline  50 mg Per Tube Daily  . thiamine  100 mg Per Tube Daily   Continuous Infusions:   LOS: 39 days   Time spent: 15 minutes.  Patrecia Pour, MD [facility] [email or web address] 08/15/2019, 9:13 AM

## 2019-08-15 NOTE — Plan of Care (Signed)

## 2019-08-15 NOTE — Plan of Care (Signed)
°  Problem: Elimination: °Goal: Will not experience complications related to bowel motility °Outcome: Progressing °  °Problem: Pain Managment: °Goal: General experience of comfort will improve °Outcome: Progressing °  °

## 2019-08-16 NOTE — Progress Notes (Signed)
PROGRESS NOTE  Devon Peterson  [id number] DOB: 05/17/1955 DOA: 07/07/2019 PCP: Patient, No Pcp Per   Brief Narrative: 64 year old male with history of COPD, cocaine abuse, tobacco abuse presented on Jul 07, 2019 with aspiration pneumonia and was found to have hypopharyngeal mass.  He had direct laryngoscopy with biopsy and tracheostomy on Jul 09, 2019.  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  He has had a prolonged hospitalization.  He had a G-tube placed on Jul 14, 2019.  He had some tracheostomy bleeding on Jul 26, 2019 which resolved without any intervention. On Jul 27, 2019, he was transferred to ICU for respiratory distress; placed on ventilator.  On Jul 30, 2019, he had episode of atrial flutter which responded to amiodarone boluses only.  Subsequently, he has been off of mechanical ventilation.  He was transferred back to [facility] service on 08/05/19  Assessment & Plan: Principal Problem:   Aspiration pneumonia ([place]) Active Problems:   Sepsis ([place])   Dysphagia   Severe protein-calorie malnutrition ([place])   Alcohol use   Pressure injury of skin   Lung mass   Neck mass   Tracheostomy care ([place])   Squamous cell carcinoma of neck   Palliative care encounter   Acute respiratory insufficiency  Acute hypoxic respiratory failure, currently trach-dependent Aspiration pneumonia: Treated with antibiotics - Changed to #6 cuffed trach by ENT on Jul 27, 2019.  Patient tolerated trach collar since a.m. of Jul 31, 2019 and vent was discontinued on Aug 02, 2019. - Transferred back to [facility] service on 08/05/2019 - Trach care as per pulmonary. - Wean oxygen as able: ATC 6/13. Continue suctioning.  - Patient is not a candidate for decannulation because of laryngeal cancer - Currently hemodynamically stable with no temperature spikes. Will plan to check routine labs again 6/14 including CMP, CBC.  Squamous cell carcinoma of larynx Left lower lung mass -Currently undergoing  radiation therapy with plans for radiation treatments till 08/21/2019.  Oncology following intermittently, planning CT chest 6/14.   Dysphagia Cachexia Severe protein calorie malnutrition Generalized conditioning -Continue tube feeds via G-tube (placed 07/14/2019).  -Can stop checking CBGs. These have been within goal range for many days with no insulin required and no hypoglycemia noted. -Overall prognosis is guarded to poor.  Patient remains full code.  Palliative care has evaluated patient as well during the hospitalization. No evidence of current infection.  Hypomagnesemia -Replace.  Repeat a.m. labs  COPD with emphysema -Continue supplemental oxygen.  Continue nebs  Anemia of critical illness, iron deficiency, chronic disease -Hemoglobin stable.  Monitor intermittently.  Paroxysmal/transient A. Fib/flutter -Had required amiodarone boluses briefly.  Currently rate controlled.  Continue metoprolol  Stage II sacral pressure ulcer -Present on admission -Continue wound care  Depression -Continue Zoloft   Stage II pressure injuries to mid sacrum: Present on admission -3 separate areas.  Continue wound care Pressure Injury 07/07/19 Sacrum Mid;Upper Stage 2 -  Partial thickness loss of dermis presenting as a shallow open injury with a red, pink wound bed without slough. 3 each small separate open areas (Active)  07/07/19 2107  Location: Sacrum  Location Orientation: Mid;Upper  Staging: Stage 2 -  Partial thickness loss of dermis presenting as a shallow open injury with a red, pink wound bed without slough.  Wound Description (Comments): 3 each small separate open areas  Present on Admission: Yes   Severe protein calorie malnutrition: BMI 12.  - Supplement protein as able.  DVT prophylaxis: SCDs Code Status: Full  Family Communication: None at bedside Disposition Plan:  Status is: Inpatient  Remains inpatient appropriate because:Unsafe d/c plan   Dispo: The  patient is from: Home              Anticipated d/c is to: SNF              Anticipated d/c date is: 08/21/2019              Patient currently is medically stable to d/c.  Consultants:   PCCM, ENT, oncology, palliative care, radiation oncology.   Procedures:  Intubation Tracheostomy on 07/09/2019 G-tube on 07/14/2019  Antimicrobials: Unasyn from 07/07/2019-07/14/2019  Subjective: Needs his pain medicine which takes care of his pain reliably, no other complaints. Changed over to ATC today.  Objective: Vitals:   08/16/19 0738 08/16/19 1132 08/16/19 1329 08/16/19 1500  BP:      Pulse: 70 65  75  Resp: '16 16  15  '$ Temp:   98.2 F (36.8 C)   TempSrc:   Oral   SpO2: 100% 95%  99%  Weight:      Height:        Intake/Output Summary (Last 24 hours) at 08/16/2019 1612 Last data filed at 08/16/2019 1330 Gross per 24 hour  Intake 237 ml  Output 1100 ml  Net -863 ml   Filed Weights   07/09/19 0949 07/20/19 1600 08/10/19 1225  Weight: 42 kg 44 kg 42.2 kg   GEN: Cachectic male in no distress PULM: Rattles thru trach, no wheezes or lower crackles CV: RRR  Data Reviewed: I have personally reviewed following labs and imaging studies  CBC: Recent Labs  Lab 08/11/19 0523 08/12/19 0417  WBC 4.7 4.3  NEUTROABS 3.2 3.0  HGB 10.5* 10.1*  HCT 31.8* 31.4*  MCV 84.4 86.3  PLT 280 161   Basic Metabolic Panel: Recent Labs  Lab 08/11/19 0523 08/12/19 0417  NA 134* 134*  K 4.8 4.5  CL 96* 96*  CO2 24 27  GLUCOSE 89 81  BUN 19 22  CREATININE 0.46* 0.41*  CALCIUM 9.1 9.1  MG 1.5* 1.8   GFR: Estimated Creatinine Clearance: 55.7 mL/min (A) (by C-G formula based on SCr of 0.41 mg/dL (L)). Liver Function Tests: Recent Labs  Lab 08/12/19 0417  AST 20  ALT 16  ALKPHOS 66  BILITOT 0.3  PROT 7.0  ALBUMIN 3.2*   No results for input(s): LIPASE, AMYLASE in the last 168 hours. No results for input(s): AMMONIA in the last 168 hours. Coagulation Profile: No results for input(s):  INR, PROTIME in the last 168 hours. Cardiac Enzymes: No results for input(s): CKTOTAL, CKMB, CKMBINDEX, TROPONINI in the last 168 hours. BNP (last 3 results) No results for input(s): PROBNP in the last 8760 hours. HbA1C: No results for input(s): HGBA1C in the last 72 hours. CBG: Recent Labs  Lab 08/14/19 0005 08/14/19 0747 08/14/19 1609 08/15/19 0004 08/15/19 0409  GLUCAP 148* 79 87 105* 85   Lipid Profile: No results for input(s): CHOL, HDL, LDLCALC, TRIG, CHOLHDL, LDLDIRECT in the last 72 hours. Thyroid Function Tests: No results for input(s): TSH, T4TOTAL, FREET4, T3FREE, THYROIDAB in the last 72 hours. Anemia Panel: No results for input(s): VITAMINB12, FOLATE, FERRITIN, TIBC, IRON, RETICCTPCT in the last 72 hours. Urine analysis:    Component Value Date/Time   COLORURINE YELLOW 07/07/2019 1729   APPEARANCEUR HAZY (A) 07/07/2019 1729   LABSPEC 1.016 07/07/2019 1729   PHURINE 5.0 07/07/2019 1729   GLUCOSEU NEGATIVE 07/07/2019 1729  HGBUR NEGATIVE 07/07/2019 1729   BILIRUBINUR NEGATIVE 07/07/2019 1729   KETONESUR NEGATIVE 07/07/2019 1729   PROTEINUR 30 (A) 07/07/2019 1729   UROBILINOGEN 1.0 10/28/2008 0458   NITRITE NEGATIVE 07/07/2019 1729   LEUKOCYTESUR NEGATIVE 07/07/2019 1729   No results found for this or any previous visit (from the past 240 hour(s)).    Radiology Studies: No results found.  Scheduled Meds: . ascorbic acid  100 mg Per Tube BID  . chlorhexidine gluconate (MEDLINE KIT)  15 mL Mouth Rinse BID  . Chlorhexidine Gluconate Cloth  6 each Topical Daily  . docusate  100 mg Oral BID  . feeding supplement (OSMOLITE 1.5 CAL)  237 mL Per Tube 5 X Daily  . feeding supplement (PRO-STAT SUGAR FREE 64)  30 mL Per Tube BID  . ferrous sulfate  300 mg Per Tube BID WC  . free water  120 mL Per Tube 5 X Daily  . mouth rinse  15 mL Mouth Rinse 10 times per day  . metoprolol tartrate  25 mg Per Tube BID  . pantoprazole sodium  40 mg Per Tube Q24H  .  polyethylene glycol  17 g Oral Daily  . senna-docusate  1 tablet Oral BID  . sertraline  50 mg Per Tube Daily  . thiamine  100 mg Per Tube Daily   Continuous Infusions:   LOS: 40 days   Time spent: 15 minutes.  Patrecia Pour, MD [facility] [email or web address] 08/16/2019, 4:12 PM

## 2019-08-17 ENCOUNTER — Ambulatory Visit
Admit: 2019-08-17 | Discharge: 2019-08-17 | Disposition: A | Discharge status: Home / Self Care | Payer: Medicaid Other | Attending: Radiation Oncology | Admitting: Radiation Oncology

## 2019-08-17 ENCOUNTER — Inpatient Hospital Stay (HOSPITAL_COMMUNITY): Payer: Medicaid Other

## 2019-08-17 LAB — CBC
HCT: 30.7 % — ABNORMAL LOW (ref 39.0–52.0)
Hemoglobin: 10.1 g/dL — ABNORMAL LOW (ref 13.0–17.0)
MCH: 28.5 pg (ref 26.0–34.0)
MCHC: 32.9 g/dL (ref 30.0–36.0)
MCV: 86.5 fL (ref 80.0–100.0)
Platelets: 264 10*3/uL (ref 150–400)
RBC: 3.55 MIL/uL — ABNORMAL LOW (ref 4.22–5.81)
RDW: 14.4 % (ref 11.5–15.5)
WBC: 3.3 10*3/uL — ABNORMAL LOW (ref 4.0–10.5)
nRBC: 0 % (ref 0.0–0.2)

## 2019-08-17 LAB — COMPREHENSIVE METABOLIC PANEL
ALT: 12 U/L (ref 0–44)
AST: 15 U/L (ref 15–41)
Albumin: 3.1 g/dL — ABNORMAL LOW (ref 3.5–5.0)
Alkaline Phosphatase: 60 U/L (ref 38–126)
Anion gap: 12 (ref 5–15)
BUN: 21 mg/dL (ref 8–23)
CO2: 27 mmol/L (ref 22–32)
Calcium: 9 mg/dL (ref 8.9–10.3)
Chloride: 97 mmol/L — ABNORMAL LOW (ref 98–111)
Creatinine, Ser: 0.45 mg/dL — ABNORMAL LOW (ref 0.61–1.24)
GFR calc Af Amer: >60 mL/min (ref >60)
GFR calc non Af Amer: >60 mL/min (ref >60)
Glucose, Bld: 86 mg/dL (ref 70–99)
Potassium: 4.3 mmol/L (ref 3.5–5.1)
Sodium: 136 mmol/L (ref 135–145)
Total Bilirubin: 0.2 mg/dL — ABNORMAL LOW (ref 0.3–1.2)
Total Protein: 6.7 g/dL (ref 6.5–8.1)

## 2019-08-17 MED ORDER — IOHEXOL 300 MG/ML  SOLN
100.0000 mL | Freq: Once | INTRAMUSCULAR | Status: AC | PRN
  Administered 2019-08-17: 75 mL via INTRAVENOUS

## 2019-08-17 MED ORDER — SODIUM CHLORIDE (PF) 0.9 % IJ SOLN
INTRAMUSCULAR | Status: AC
  Filled 2019-08-17: qty 50

## 2019-08-17 NOTE — TOC Progression Note (Signed)
Transition of Care [facility]) - Progression Note    Patient Details  Name: Devon Peterson MRN: [id number] Date of Birth: 1955/05/22  [facility] CM/SW Contact  Purcell Mouton, RN Phone Number: 08/17/2019, 3:22 PM  Clinical Narrative:     Continuing to follow pt for SNF. Pt plan to finish Radiation on 6/18. Hoping that SNF will take pt at that time.   Expected Discharge Plan: Skilled Nursing Facility Barriers to Discharge: Continued Medical Work up  Expected Discharge Plan and Services Expected Discharge Plan: [place] Acute Care Choice: [place]                                         Social Determinants of Health (SDOH) Interventions    Readmission Risk Interventions Readmission Risk Prevention Plan 07/27/2019  Transportation Screening Complete  HRI or [place] Complete  Social Work Consult for [place] Planning/Counseling Complete  Palliative Care Screening Complete  Medication Review [employer]) Complete  Some recent data might be hidden

## 2019-08-17 NOTE — Progress Notes (Signed)
PROGRESS NOTE  Devon Peterson  [id number] DOB: 1955-03-30 DOA: 07/07/2019 PCP: Patient, No Pcp Per   Brief Narrative: 65 year old male with history of COPD, cocaine abuse, tobacco abuse presented on Jul 07, 2019 with aspiration pneumonia and was found to have hypopharyngeal mass.  He had direct laryngoscopy with biopsy and tracheostomy on Jul 09, 2019.  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  He has had a prolonged hospitalization.  He had a G-tube placed on Jul 14, 2019.  He had some tracheostomy bleeding on Jul 26, 2019 which resolved without any intervention. On Jul 27, 2019, he was transferred to ICU for respiratory distress; placed on ventilator.  On Jul 30, 2019, he had episode of atrial flutter which responded to amiodarone boluses only.  Subsequently, he has been off of mechanical ventilation.  He was transferred back to [facility] service on 08/05/19  Assessment & Plan: Principal Problem:   Aspiration pneumonia ([place]) Active Problems:   Sepsis ([place])   Dysphagia   Severe protein-calorie malnutrition ([place])   Alcohol use   Pressure injury of skin   Lung mass   Neck mass   Tracheostomy care ([place])   Squamous cell carcinoma of neck   Palliative care encounter   Acute respiratory insufficiency  Acute hypoxic respiratory failure, currently trach-dependent Aspiration pneumonia: Treated with antibiotics - Changed to #6 cuffed trach by ENT on Jul 27, 2019.  Patient tolerated trach collar since a.m. of Jul 31, 2019 and vent was discontinued on Aug 02, 2019. - Transferred back to [facility] service on 08/05/2019 - Trach care as per pulmonary. - Wean oxygen as able: ATC 6/13. Continue suctioning.  - Patient is not a candidate for decannulation because of laryngeal cancer - Currently hemodynamically stable with no temperature spikes. Will plan to check routine labs again 6/14 including CMP, CBC.   Squamous cell carcinoma of larynx Left lower lung mass -Currently undergoing  radiation therapy with plans for radiation treatments till 08/21/2019.  Oncology following intermittently, planning CT chest 6/14 which is being performed.  Dysphagia Cachexia Severe protein calorie malnutrition Generalized conditioning -Continue tube feeds via G-tube (placed 07/14/2019).  -Can stop checking CBGs. These have been within goal range for many days with no insulin required and no hypoglycemia noted. -Overall prognosis is guarded to poor.  Patient remains full code.  Palliative care has evaluated patient as well during the hospitalization. No evidence of current infection.  Hypomagnesemia: Supplemented with improvement.   COPD with emphysema -Continue supplemental oxygen.  Continue nebs  Anemia of critical illness, iron deficiency, chronic disease -Hemoglobin stable.  Monitor intermittently.  Paroxysmal/transient A. Fib/flutter -Had required amiodarone boluses briefly.  Currently rate controlled.  Continue metoprolol  Stage II sacral pressure ulcer -Present on admission -Continue wound care  Depression -Continue Zoloft  Stage II pressure injuries to mid sacrum: Present on admission -3 separate areas.  Continue wound care Pressure Injury 07/07/19 Sacrum Mid;Upper Stage 2 -  Partial thickness loss of dermis presenting as a shallow open injury with a red, pink wound bed without slough. 3 each small separate open areas (Active)  07/07/19 2107  Location: Sacrum  Location Orientation: Mid;Upper  Staging: Stage 2 -  Partial thickness loss of dermis presenting as a shallow open injury with a red, pink wound bed without slough.  Wound Description (Comments): 3 each small separate open areas  Present on Admission: Yes   Severe protein calorie malnutrition: BMI 12.  - Supplement protein as able.  DVT prophylaxis: SCDs Code  Status: Full Family Communication: None at bedside Disposition Plan:  Status is: Inpatient  Remains inpatient appropriate because:Unsafe d/c  plan   Dispo: The patient is from: Home              Anticipated d/c is to: SNF              Anticipated d/c date is: 08/21/2019              Patient currently is medically stable to d/c.  Consultants:   PCCM, ENT, oncology, palliative care, radiation oncology.   Procedures:  Intubation Tracheostomy on 07/09/2019 G-tube on 07/14/2019  Antimicrobials: Unasyn from 07/07/2019-07/14/2019  Subjective: Wants to eat, no trouble breathing currently. Tolerating ATC  Objective: Vitals:   08/17/19 0600 08/17/19 0725 08/17/19 0744 08/17/19 1229  BP: 96/73  96/73 96/68  Pulse:   77 72  Resp: '13 20 17 16  '$ Temp:      TempSrc:      SpO2:   99% 98%  Weight:      Height:        Intake/Output Summary (Last 24 hours) at 08/17/2019 1342 Last data filed at 08/17/2019 0900 Gross per 24 hour  Intake 0 ml  Output 1000 ml  Net -1000 ml   Filed Weights   07/09/19 0949 07/20/19 1600 08/10/19 1225  Weight: 42 kg 44 kg 42.2 kg   GEN: Cachectic but in no distress PULM: Nonlabored, no wheezes CV: RRR, no M/R/G or edema GI: Soft, NT, ND, G tube site c/d/i.  Data Reviewed: I have personally reviewed following labs and imaging studies  CBC: Recent Labs  Lab 08/11/19 0523 08/12/19 0417 08/17/19 0432  WBC 4.7 4.3 3.3*  NEUTROABS 3.2 3.0  --   HGB 10.5* 10.1* 10.1*  HCT 31.8* 31.4* 30.7*  MCV 84.4 86.3 86.5  PLT 280 275 354   Basic Metabolic Panel: Recent Labs  Lab 08/11/19 0523 08/12/19 0417 08/17/19 0432  NA 134* 134* 136  K 4.8 4.5 4.3  CL 96* 96* 97*  CO2 '24 27 27  '$ GLUCOSE 89 81 86  BUN '19 22 21  '$ CREATININE 0.46* 0.41* 0.45*  CALCIUM 9.1 9.1 9.0  MG 1.5* 1.8  --    GFR: Estimated Creatinine Clearance: 55.7 mL/min (A) (by C-G formula based on SCr of 0.45 mg/dL (L)). Liver Function Tests: Recent Labs  Lab 08/12/19 0417 08/17/19 0432  AST 20 15  ALT 16 12  ALKPHOS 66 60  BILITOT 0.3 0.2*  PROT 7.0 6.7  ALBUMIN 3.2* 3.1*   No results for input(s): LIPASE, AMYLASE in  the last 168 hours. No results for input(s): AMMONIA in the last 168 hours. Coagulation Profile: No results for input(s): INR, PROTIME in the last 168 hours. Cardiac Enzymes: No results for input(s): CKTOTAL, CKMB, CKMBINDEX, TROPONINI in the last 168 hours. BNP (last 3 results) No results for input(s): PROBNP in the last 8760 hours. HbA1C: No results for input(s): HGBA1C in the last 72 hours. CBG: Recent Labs  Lab 08/14/19 0005 08/14/19 0747 08/14/19 1609 08/15/19 0004 08/15/19 0409  GLUCAP 148* 79 87 105* 85   Lipid Profile: No results for input(s): CHOL, HDL, LDLCALC, TRIG, CHOLHDL, LDLDIRECT in the last 72 hours. Thyroid Function Tests: No results for input(s): TSH, T4TOTAL, FREET4, T3FREE, THYROIDAB in the last 72 hours. Anemia Panel: No results for input(s): VITAMINB12, FOLATE, FERRITIN, TIBC, IRON, RETICCTPCT in the last 72 hours. Urine analysis:    Component Value Date/Time   COLORURINE YELLOW 07/07/2019  [place] (A) 07/07/2019 1729   LABSPEC 1.016 07/07/2019 1729   PHURINE 5.0 07/07/2019 1729   GLUCOSEU NEGATIVE 07/07/2019 1729   HGBUR NEGATIVE 07/07/2019 1729   BILIRUBINUR NEGATIVE 07/07/2019 1729   KETONESUR NEGATIVE 07/07/2019 1729   PROTEINUR 30 (A) 07/07/2019 1729   UROBILINOGEN 1.0 10/28/2008 0458   NITRITE NEGATIVE 07/07/2019 1729   LEUKOCYTESUR NEGATIVE 07/07/2019 1729   No results found for this or any previous visit (from the past 240 hour(s)).    Radiology Studies: CT CHEST W CONTRAST  Result Date: 08/17/2019 CLINICAL DATA:  Laryngeal cancer, lung mass. EXAM: CT CHEST WITH CONTRAST TECHNIQUE: Multidetector CT imaging of the chest was performed during intravenous contrast administration. CONTRAST:  54m OMNIPAQUE IOHEXOL 300 MG/ML  SOLN COMPARISON:  07/07/2019. FINDINGS: Cardiovascular: Coronary artery calcification. Heart size normal. No pericardial effusion. Mediastinum/Nodes: Hypopharyngeal mass is incidentally imaged. No  pathologically enlarged mediastinal, hilar or axillary lymph nodes. Esophagus is grossly unremarkable. Lungs/Pleura: Centrilobular emphysema. Mild scarring in the apices. Persistent 2.1 x 2.8 cm low-attenuation lesion in the superior segment left lower lobe, stable in size from 07/07/2019. Surrounding collapse/consolidation in the left lower lobe. Basilar predominant peribronchovascular nodularity, ground-glass and consolidation seen on 07/07/2019 have otherwise largely resolved. No pleural fluid. Tracheostomy is in place. Airway is otherwise unremarkable. Upper Abdomen: Visualized portions of the liver, adrenal glands, kidneys, spleen, pancreas, stomach and bowel are grossly unremarkable. Percutaneous gastrostomy tube is partially imaged. Musculoskeletal: No worrisome lytic or sclerotic lesions. IMPRESSION: 1. Low-attenuation lesion in the superior segment left lower lobe, unchanged from 07/07/2019 and worrisome for malignancy. Residual surrounding collapse/consolidation in the left lower lobe. 2. Basilar predominant peribronchovascular nodularity, ground-glass and consolidation seen on 07/07/2019 have otherwise largely resolved. 3. Hypopharyngeal mass, consistent with the given history of laryngeal cancer. 4. Coronary artery calcification. 5.  Emphysema (ICD10-J43.9). Electronically Signed   By: MLorin PicketM.D.   On: 08/17/2019 11:57    Scheduled Meds: . ascorbic acid  100 mg Per Tube BID  . chlorhexidine gluconate (MEDLINE KIT)  15 mL Mouth Rinse BID  . Chlorhexidine Gluconate Cloth  6 each Topical Daily  . docusate  100 mg Oral BID  . feeding supplement (OSMOLITE 1.5 CAL)  237 mL Per Tube 5 X Daily  . feeding supplement (PRO-STAT SUGAR FREE 64)  30 mL Per Tube BID  . ferrous sulfate  300 mg Per Tube BID WC  . free water  120 mL Per Tube 5 X Daily  . mouth rinse  15 mL Mouth Rinse 10 times per day  . metoprolol tartrate  25 mg Per Tube BID  . pantoprazole sodium  40 mg Per Tube Q24H  .  polyethylene glycol  17 g Oral Daily  . senna-docusate  1 tablet Oral BID  . sertraline  50 mg Per Tube Daily  . sodium chloride (PF)      . thiamine  100 mg Per Tube Daily   Continuous Infusions:   LOS: 41 days   Time spent: 15 minutes.  RPatrecia Pour MD [facility] [email or web address] 08/17/2019, 1:42 PM

## 2019-08-18 ENCOUNTER — Ambulatory Visit
Admit: 2019-08-18 | Discharge: 2019-08-18 | Disposition: A | Discharge status: Home / Self Care | Payer: Medicaid Other | Attending: Radiation Oncology | Admitting: Radiation Oncology

## 2019-08-18 NOTE — TOC Progression Note (Signed)
Transition of Care [facility]) - Progression Note    Patient Details  Name: Duval Macleod MRN: [id number] Date of Birth: 1955-03-25  [facility] of [facility]) CM/SW Contact  Purcell Mouton, RN Phone Number: 08/18/2019, 9:54 AM  Clinical Narrative:    [facility] Admission coordinator, planning ahead for a bed on 6/18. Will continue to follow up.   Expected Discharge Plan: Skilled Nursing Facility Barriers to Discharge: Continued Medical Work up  Expected Discharge Plan and Services Expected Discharge Plan: [place] Acute Care Choice: [place]                                         Social Determinants of Health (SDOH) Interventions    Readmission Risk Interventions Readmission Risk Prevention Plan 07/27/2019  Transportation Screening Complete  HRI or [place] Complete  Social Work Consult for [place] Planning/Counseling Complete  Palliative Care Screening Complete  Medication Review [employer]) Complete  Some recent data might be hidden

## 2019-08-18 NOTE — Progress Notes (Addendum)
HEMATOLOGY-ONCOLOGY PROGRESS NOTE  SUBJECTIVE: The patient has no complaints this morning.  He continues radiation per Dr. Isidore Moos.  Scheduled to complete radiation on 08/21/2019.  He had a repeat CT scan of the chest to follow-up on his left lung mass.  Mass unchanged compared to prior CT on 07/07/2019 and concerning for malignancy.  PHYSICAL EXAMINATION:  Vitals:   08/18/19 0827 08/18/19 1131  BP:    Pulse: 75   Resp: 16   Temp:    SpO2: 96% 99%   Filed Weights   07/09/19 0949 07/20/19 1600 08/10/19 1225  Weight: 42 kg 44 kg 42.2 kg    Intake/Output from previous day: 06/14 0701 - 06/15 0700 In: 0  Out: 600 [Urine:600]  General:  Severe cachexia, temporal muscle wasting noted.      Neck: tracheostomy midline , oropharynx without thrush or ulcers Respiratory: Diminished breath sounds.   Cardiovascular: Regular rate and rhythm, no lower extremity edema GI:  abdomen was soft, flat, nontender, nondistended, without organomegaly.    PEG tube without erythema or drainage.   Neuro:  Awake and alert.    LABORATORY DATA:  I have reviewed the data as listed CMP Latest Ref Rng & Units 08/17/2019 08/12/2019 08/11/2019  Glucose 70 - 99 mg/dL 86 81 89  BUN 8 - 23 mg/dL 21 22 19   Creatinine 0.61 - 1.24 mg/dL 0.45(L) 0.41(L) 0.46(L)  Sodium 135 - 145 mmol/L 136 134(L) 134(L)  Potassium 3.5 - 5.1 mmol/L 4.3 4.5 4.8  Chloride 98 - 111 mmol/L 97(L) 96(L) 96(L)  CO2 22 - 32 mmol/L 27 27 24   Calcium 8.9 - 10.3 mg/dL 9.0 9.1 9.1  Total Protein 6.5 - 8.1 g/dL 6.7 7.0 -  Total Bilirubin 0.3 - 1.2 mg/dL 0.2(L) 0.3 -  Alkaline Phos 38 - 126 U/L 60 66 -  AST 15 - 41 U/L 15 20 -  ALT 0 - 44 U/L 12 16 -    Lab Results  Component Value Date   WBC 3.3 (L) 08/17/2019   HGB 10.1 (L) 08/17/2019   HCT 30.7 (L) 08/17/2019   MCV 86.5 08/17/2019   PLT 264 08/17/2019   NEUTROABS 3.0 08/12/2019    CT CHEST W CONTRAST  Result Date: 08/17/2019 CLINICAL DATA:  Laryngeal cancer, lung mass. EXAM: CT CHEST  WITH CONTRAST TECHNIQUE: Multidetector CT imaging of the chest was performed during intravenous contrast administration. CONTRAST:  52mL OMNIPAQUE IOHEXOL 300 MG/ML  SOLN COMPARISON:  07/07/2019. FINDINGS: Cardiovascular: Coronary artery calcification. Heart size normal. No pericardial effusion. Mediastinum/Nodes: Hypopharyngeal mass is incidentally imaged. No pathologically enlarged mediastinal, hilar or axillary lymph nodes. Esophagus is grossly unremarkable. Lungs/Pleura: Centrilobular emphysema. Mild scarring in the apices. Persistent 2.1 x 2.8 cm low-attenuation lesion in the superior segment left lower lobe, stable in size from 07/07/2019. Surrounding collapse/consolidation in the left lower lobe. Basilar predominant peribronchovascular nodularity, ground-glass and consolidation seen on 07/07/2019 have otherwise largely resolved. No pleural fluid. Tracheostomy is in place. Airway is otherwise unremarkable. Upper Abdomen: Visualized portions of the liver, adrenal glands, kidneys, spleen, pancreas, stomach and bowel are grossly unremarkable. Percutaneous gastrostomy tube is partially imaged. Musculoskeletal: No worrisome lytic or sclerotic lesions. IMPRESSION: 1. Low-attenuation lesion in the superior segment left lower lobe, unchanged from 07/07/2019 and worrisome for malignancy. Residual surrounding collapse/consolidation in the left lower lobe. 2. Basilar predominant peribronchovascular nodularity, ground-glass and consolidation seen on 07/07/2019 have otherwise largely resolved. 3. Hypopharyngeal mass, consistent with the given history of laryngeal cancer. 4. Coronary artery calcification. 5.  Emphysema (ICD10-J43.9). Electronically Signed   By: Lorin Picket M.D.   On: 08/17/2019 11:57   DG Chest Port 1 View  Result Date: 07/31/2019 CLINICAL DATA:  Respiratory failure EXAM: PORTABLE CHEST 1 VIEW COMPARISON:  07/29/2019 FINDINGS: No significant change in AP portable examination. Tracheostomy. There is  probable left basilar atelectasis or consolidation and associated layering pleural effusion. No new airspace opacity. The heart and mediastinum are unremarkable. IMPRESSION: 1. No significant change in AP portable examination. There is probable left basilar atelectasis or consolidation and associated layering pleural effusion. No new airspace opacity. 2.  Tracheostomy. Electronically Signed   By: Eddie Candle M.D.   On: 07/31/2019 08:16   DG Chest Port 1 View  Result Date: 07/29/2019 CLINICAL DATA:  Respiratory failure. EXAM: PORTABLE CHEST 1 VIEW COMPARISON:  07/28/2019 FINDINGS: The tracheostomy tube is stable. The cardiac silhouette, mediastinal and hilar contours are within normal limits and unchanged. Stable hyperinflation and emphysematous changes. Persistent patchy bibasilar infiltrates, left greater than right. IMPRESSION: Persistent bibasilar infiltrates, left greater than right. Electronically Signed   By: Marijo Sanes M.D.   On: 07/29/2019 09:12   Portable Chest xray  Result Date: 07/28/2019 CLINICAL DATA:  Respiratory insufficiency EXAM: PORTABLE CHEST 1 VIEW COMPARISON:  Yesterday FINDINGS: Extensive airspace disease with volume loss on the left. There was airspace opacity and airway obstruction by recent chest CT. Milder infiltrate at the right base. The lungs are hyperinflated. Normal heart size. Tracheostomy tube in place. IMPRESSION: Unchanged pneumonia and volume loss on the left. Electronically Signed   By: Monte Fantasia M.D.   On: 07/28/2019 07:53   DG CHEST PORT 1 VIEW  Result Date: 07/27/2019 CLINICAL DATA:  Tracheostomy, respiratory failure EXAM: PORTABLE CHEST 1 VIEW COMPARISON:  07/26/2019 FINDINGS: Tracheostomy in satisfactory position. Patient is rotated. Mild left perihilar/infrahilar soft tissue prominence, favoring pneumonia. Small left pleural effusion. Right lung is clear, noting minimal right basilar atelectasis versus pneumonia. No pneumothorax. The heart is normal in  size. IMPRESSION: Suspected left upper and lower lobe pneumonia. Small left pleural effusion. Mild right basilar atelectasis versus pneumonia. Electronically Signed   By: Julian Hy M.D.   On: 07/27/2019 13:56   DG CHEST PORT 1 VIEW  Result Date: 07/26/2019 CLINICAL DATA:  Shortness of breath, tracheostomy EXAM: PORTABLE CHEST 1 VIEW COMPARISON:  07/25/2019 FINDINGS: The heart size and mediastinal contours are within normal limits. Tracheostomy. Possible small layering pleural effusions. The visualized skeletal structures are unremarkable. IMPRESSION: Possible small layering pleural effusions. No acute appearing airspace opacity. Electronically Signed   By: Eddie Candle M.D.   On: 07/26/2019 15:17   DG CHEST PORT 1 VIEW  Result Date: 07/25/2019 CLINICAL DATA:  Shortness of breath EXAM: PORTABLE CHEST 1 VIEW COMPARISON:  Two days ago FINDINGS: Hazy opacity of the bilateral lower chest which includes pleural fluid. There was lower lobe pneumonia by recent chest CT. Tracheostomy tube and percutaneous gastrostomy tube in place. Normal heart size. IMPRESSION: Stable hazy bilateral lower chest opacification correlating with pneumonia on recent chest CT. There is also layering pleural fluid. Electronically Signed   By: Monte Fantasia M.D.   On: 07/25/2019 07:18   DG CHEST PORT 1 VIEW  Result Date: 07/23/2019 CLINICAL DATA:  Shortness of breath. EXAM: PORTABLE CHEST 1 VIEW COMPARISON:  Chest x-ray 07/23/2019 FINDINGS: The tracheostomy tube is stable. The cardiac silhouette, mediastinal and hilar contours are within normal limits. There are bilateral pleural effusions and bibasilar atelectasis. Much improved left lower lobe aeration with  resolving pneumonia. IMPRESSION: Persistent bilateral pleural effusions and bibasilar atelectasis. Much improved left lower lobe lung aeration with resolving pneumonia. Electronically Signed   By: Marijo Sanes M.D.   On: 07/23/2019 08:03    ASSESSMENT AND PLAN: 1.   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  Left lower lobe of the lung mass concerning for primary neoplasm versus metastatic disease             -07/07/2019 CT angiogram of the chest 2.7 cm mass in the left lower lobe of the lung             -CT 08/17/2019-persistent low-attenuation lesion in the superior segment of left lower lobe with surrounding consolidation/collapse, stable from 07/07/2019, consolidation and groundglass opacity in the left lower lobe have largely resolved 3.  Cachexia 4.  Aspiration pneumonia 5.  Mild anemia 6.  Atrial fibrillation with RVR 6.  COPD 8.  Alcohol abuse 9.  Tobacco dependence 10.  Cocaine abuse 11.  Odynophagia secondary to radiation  Devon Peterson appears unchanged.  Continues on radiation and is scheduled to complete radiation on 08/21/2019.  Thereafter, he will be discharged to SNF.  CT of the chest with contrast was reviewed with the patient which shows a persistent left lung mass which is unchanged from the prior CT scan.  Mass concerning for malignancy -second primary versus metastatic disease.  Referral placed to IR for consideration of biopsy, but masses in the area of chronically disease lung which will not be visible during the time of biopsy.  They have recommend that we reach out to pulmonology to see if bronchoscopy can be considered.  Discussed with PCCM who will review.   Recommendations: 1.  Continue radiation. 2.  Recommend biopsy of left lung mass to determine if second primary versus metastatic disease.  Further discussion regarding treatment options and goals of care pending further work-up.   LOS: 42 days   Mikey Bussing, [place], AGPCNP-BC,  AOCNP 08/18/19  Devon Peterson appears stable.  He reports adequate pain relief.  The chest CT confirms persistence of a masslike opacity in the left lower lung.  We will ask pulmonary medicine to consider a bronchoscopic biopsy.  I discussed the CT findings with Devon Peterson.  He agrees to proceed with a biopsy.  We will determine the indication for additional treatment based on the biopsy findings.

## 2019-08-18 NOTE — Progress Notes (Signed)
PT Cancellation Note  Patient Details Name: Devon Peterson MRN: [id number] DOB: Jun 21, 1955   Cancelled Treatment:    Reason Eval/Treat Not Completed: Patient declined, no reason specified   Claretha Cooper 08/18/2019, 9:21 AM  [place] Pager [phone] Office [phone]

## 2019-08-18 NOTE — Progress Notes (Signed)
NAME:  Devon Peterson, MRN:  [id number], DOB:  [birth year], LOS: 85 ADMISSION DATE:  07/07/2019, CONSULTATION DATE:  07/09/19  REFERRING MD:  Alfredia Ferguson, CHIEF COMPLAINT:  Post-operative hypotension   Brief History   64 yo male smoker admitted 5/04 with aspiration pneumonia and found to have hypopharyngeal mass.  Had direct laryngoscopy with biopsy and tracheostomy on 5/06.  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      Past Medical History  COPD, Cocaine abuse  Significant Hospital Events   5/04 admitted 5/05 seen by ENT as initial evaluation underwent flexible laryngoscopy 5/06 OR for direct laryngoscopy + biopsy, and tracheostomy placement, postop for hypotension 5/11 G tube placed 5/23 tracheostomy bleeding >> no interventions needs 5/24 transfer to ICU for respiratory distress; placed on ventilator and changed to #4 cuffed trach (unable to pass #6 cuffed trach); ENT changed to #6 cuffed trach 5/27 episode of a flutter >  Amiodarone boluses only  5/31 Off from mechanical ventilation for 48 hours 6/01 Getting up with physical therapy 6/15 PCCM called back for FOB of LLL  Consults:  ENT Oncology Radiation oncology Palliative care  Procedures:  Tracheostomy 5/06 >> G tube 5/11 >>   Significant Diagnostic Tests:  CT angio chest 5/04 >> atherosclerosis, severe emphysema, b/l lower lobe consolidation Lt > Rt, 2.7 cm mass LLL CT neck 5/05 >> enhancing hypopharyngeal soft tissue in post cricoid region  Micro Data:  SARS CoV2 PCR 5/04 >> negative Blood 5/04 >> negative Sputum 5/24 >> no org seen > normal flora  Antimicrobials:  Unasyn 5/04 >> 5/11     Interim history/subjective:  PCCM called back for evaluation of LLL mass  Objective   Blood pressure 107/76, pulse 75, temperature 98.3 F (36.8 C), temperature source Oral, resp. rate 16, height 5\' 11"  (1.803 m), weight 42.2 kg, SpO2 99 %.    FiO2 (%):  [21 %] 21 %   Intake/Output Summary (Last 24 hours) at  08/18/2019 1318 Last data filed at 08/18/2019 0701 Gross per 24 hour  Intake 0 ml  Output 1300 ml  Net -1300 ml   Filed Weights   07/09/19 0949 07/20/19 1600 08/10/19 1225  Weight: 42 kg 44 kg 42.2 kg    Examination: General: cachectic adult male, appears older than documented age, lying in bed in NAD watching TV  HEENT: MM pink/moist, #6 cuffless trach midline c/d/i  Neuro: AAO, interacts appropriately CV: s1s2 rrr, no m/r/g PULM:  Non-labored on RA (trach collar lying in bed), lungs bilaterally diminished  GI: soft, bsx4 active  Extremities: warm/dry, no edema  Skin: no rashes or lesions  Resolved problems:  Aspiration pneumonia Acute hypoxic respiratory failure Transient A fib/flutter new this admission. Hypotension from hypovolemia.  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 -O2 to keep sats 88-94%  Squamous Cell Carcinoma of Larynx. Left Lower Lung Mass. -plan for FOB on 6/17 at 0930 per Dr. Halford Chessman to assess for additional primary  -per ONC  Goals of care Palliative care input noted -full code   Dysphagia. Cachexia. Severe Protein Calorie Malnutrition -per primary   Anemia of critical illness, iron deficiency, and chronic disease. -per primary  ETOH,  cocaine abuse. -per primary   Stage 2 Sacral Pressure Ulcer. Present on admit -per primary WOC   Depression -per primary   Best practice:  Diet: bolus tube feeds DVT prophylaxis: PAS GI prophylaxis: protonix Mobility: bed rest Code Status: full code  Disposition: per primary   Labs:   CMP Latest Ref Rng & Units 08/17/2019 08/12/2019 08/11/2019  Glucose 70 - 99 mg/dL 86 81 89  BUN 8 - 23 mg/dL 21 22 19     Creatinine 0.61 - 1.24 mg/dL 0.45(L) 0.41(L) 0.46(L)  Sodium 135 - 145 mmol/L 136 134(L) 134(L)  Potassium 3.5 - 5.1 mmol/L 4.3 4.5 4.8  Chloride 98 - 111 mmol/L 97(L) 96(L) 96(L)  CO2 22 - 32 mmol/L 27 27 24   Calcium 8.9 - 10.3 mg/dL 9.0 9.1 9.1  Total Protein 6.5 - 8.1 g/dL 6.7 7.0 -  Total Bilirubin 0.3 - 1.2 mg/dL 0.2(L) 0.3 -  Alkaline Phos 38 - 126 U/L 60 66 -  AST 15 - 41 U/L 15 20 -  ALT 0 - 44 U/L 12 16 -    CBC Latest Ref Rng & Units 08/17/2019 08/12/2019 08/11/2019  WBC 4.0 - 10.5 K/uL 3.3(L) 4.3 4.7  Hemoglobin 13.0 - 17.0 g/dL 10.1(L) 10.1(L) 10.5(L)  Hematocrit 39 - 52 % 30.7(L) 31.4(L) 31.8(L)  Platelets 150 - 400 K/uL 264 275 280    ABG    Component Value Date/Time   PHART 7.299 (L) 07/27/2019 1655   PCO2ART 60.2 (H) 07/27/2019 1655   PO2ART 32.8 (LL) 07/27/2019 1655   HCO3 28.6 (H) 07/27/2019 1655   O2SAT 44.3 07/27/2019 1655     CBG (last 3)  No results for input(s): GLUCAP in the last 72 hours.       [name], MSN, NP-C [facility] 08/18/2019, 1:19 PM   Please see [email or web address] for pager details.

## 2019-08-18 NOTE — Progress Notes (Signed)
OT Cancellation Note  Patient Details Name: Devon Peterson MRN: [id number] DOB: [birth year]   Cancelled Treatment:    Reason Eval/Treat Not Completed: Patient declined, no reason specified  [name] L Hakeem Frazzini 08/18/2019, 10:06 AM

## 2019-08-18 NOTE — Progress Notes (Signed)
PROGRESS NOTE  Devon Peterson  [id number] DOB: [birth year] DOA: 07/07/2019 PCP: Patient, No Pcp Per   Brief Narrative: 64 year old male with history of COPD, cocaine abuse, tobacco abuse presented on Jul 07, 2019 with aspiration pneumonia and was found to have hypopharyngeal mass.  He had direct laryngoscopy with biopsy and tracheostomy on Jul 09, 2019.  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  He has had a prolonged hospitalization.  He had a G-tube placed on Jul 14, 2019.  He had some tracheostomy bleeding on Jul 26, 2019 which resolved without any intervention. On Jul 27, 2019, he was transferred to ICU for respiratory distress; placed on ventilator.  On Jul 30, 2019, he had episode of atrial flutter which responded to amiodarone boluses only.  Subsequently, he has been off of mechanical ventilation.  He was transferred back to [facility] service on 08/05/19  Assessment & Plan: Principal Problem:   Aspiration pneumonia ([place]) Active Problems:   Sepsis ([place])   Dysphagia   Severe protein-calorie malnutrition ([place])   Alcohol use   Pressure injury of skin   Lung mass   Neck mass   Tracheostomy care ([place])   Squamous cell carcinoma of neck   Palliative care encounter   Acute respiratory insufficiency  Acute hypoxic respiratory failure, currently trach-dependent Aspiration pneumonia: Treated with antibiotics - Changed to #6 cuffed trach by ENT on Jul 27, 2019.  Patient tolerated trach collar since a.m. of Jul 31, 2019 and vent was discontinued on Aug 02, 2019. ATC 6/13. - Transferred back to [facility] service on 08/05/2019 - Trach care as per pulmonary. - Continue suctioning.  - Patient is not a candidate for decannulation because of laryngeal cancer - Currently hemodynamically stable with no temperature spikes. Will plan to check routine labs again 6/14 including CMP, CBC.   Squamous cell carcinoma of larynx Left lower lung mass -Currently undergoing radiation therapy with  plans for radiation treatments till 08/21/2019.  Oncology following, CT chest ordered, appreciate any new recommendations.  Dysphagia Cachexia Severe protein calorie malnutrition Generalized conditioning -Continue tube feeds via G-tube (placed 07/14/2019).  -Can stop checking CBGs. These have been within goal range for many days with no insulin required and no hypoglycemia noted. -Overall prognosis is guarded to poor.  Patient remains full code.  Palliative care has evaluated patient as well during the hospitalization. No evidence of current infection.  Hypomagnesemia: Supplemented with improvement.   COPD with emphysema -Continue supplemental oxygen.  Continue nebs  Anemia of critical illness, iron deficiency, chronic disease -Hemoglobin stable.  Monitor intermittently.  Paroxysmal/transient A. Fib/flutter -Had required amiodarone boluses briefly.  Currently rate controlled.  Continue metoprolol  Stage II sacral pressure ulcer -Present on admission -Continue wound care  Depression -Continue Zoloft  Stage II pressure injuries to mid sacrum: Present on admission -3 separate areas.  Continue wound care Pressure Injury 07/07/19 Sacrum Mid;Upper Stage 2 -  Partial thickness loss of dermis presenting as a shallow open injury with a red, pink wound bed without slough. 3 each small separate open areas (Active)  07/07/19 2107  Location: Sacrum  Location Orientation: Mid;Upper  Staging: Stage 2 -  Partial thickness loss of dermis presenting as a shallow open injury with a red, pink wound bed without slough.  Wound Description (Comments): 3 each small separate open areas  Present on Admission: Yes   Severe protein calorie malnutrition: BMI 12.  - Supplement protein as able.  DVT prophylaxis: SCDs Code Status: Full Family Communication: None at  bedside Disposition Plan:  Status is: Inpatient  Remains inpatient appropriate because:Unsafe d/c plan  Dispo: The patient is from:  Home              Anticipated d/c is to: SNF              Anticipated d/c date is: 08/21/2019. Will need updated covid testing 6/17.              Patient currently is medically stable to d/c.  Consultants:   PCCM, ENT, oncology, palliative care, radiation oncology.   Procedures:  Intubation Tracheostomy on 07/09/2019 G-tube on 07/14/2019  Antimicrobials: Unasyn from 07/07/2019-07/14/2019  Subjective: No new complaints. Tolerated treatment yesterday without issues.  Objective: Vitals:   08/17/19 2301 08/18/19 0406 08/18/19 0700 08/18/19 0827  BP:   107/76   Pulse: 77 73 77 75  Resp:    16  Temp:   98.3 F (36.8 C)   TempSrc:   Oral   SpO2:  96% 98% 96%  Weight:      Height:        Intake/Output Summary (Last 24 hours) at 08/18/2019 1050 Last data filed at 08/18/2019 0701 Gross per 24 hour  Intake 0 ml  Output 1300 ml  Net -1300 ml   Filed Weights   07/09/19 0949 07/20/19 1600 08/10/19 1225  Weight: 42 kg 44 kg 42.2 kg   GEN: Cachectic in no distress PULM: Nonlabored, cleared CV: RRR, no edema GI: Soft, NT, ND +BS, G tube c/d/i.  Data Reviewed: I have personally reviewed following labs and imaging studies  CBC: Recent Labs  Lab 08/12/19 0417 08/17/19 0432  WBC 4.3 3.3*  NEUTROABS 3.0  --   HGB 10.1* 10.1*  HCT 31.4* 30.7*  MCV 86.3 86.5  PLT 275 244   Basic Metabolic Panel: Recent Labs  Lab 08/12/19 0417 08/17/19 0432  NA 134* 136  K 4.5 4.3  CL 96* 97*  CO2 27 27  GLUCOSE 81 86  BUN 22 21  CREATININE 0.41* 0.45*  CALCIUM 9.1 9.0  MG 1.8  --    GFR: Estimated Creatinine Clearance: 55.7 mL/min (A) (by C-G formula based on SCr of 0.45 mg/dL (L)). Liver Function Tests: Recent Labs  Lab 08/12/19 0417 08/17/19 0432  AST 20 15  ALT 16 12  ALKPHOS 66 60  BILITOT 0.3 0.2*  PROT 7.0 6.7  ALBUMIN 3.2* 3.1*   No results for input(s): LIPASE, AMYLASE in the last 168 hours. No results for input(s): AMMONIA in the last 168 hours. Coagulation  Profile: No results for input(s): INR, PROTIME in the last 168 hours. Cardiac Enzymes: No results for input(s): CKTOTAL, CKMB, CKMBINDEX, TROPONINI in the last 168 hours. BNP (last 3 results) No results for input(s): PROBNP in the last 8760 hours. HbA1C: No results for input(s): HGBA1C in the last 72 hours. CBG: Recent Labs  Lab 08/14/19 0005 08/14/19 0747 08/14/19 1609 08/15/19 0004 08/15/19 0409  GLUCAP 148* 79 87 105* 85   Lipid Profile: No results for input(s): CHOL, HDL, LDLCALC, TRIG, CHOLHDL, LDLDIRECT in the last 72 hours. Thyroid Function Tests: No results for input(s): TSH, T4TOTAL, FREET4, T3FREE, THYROIDAB in the last 72 hours. Anemia Panel: No results for input(s): VITAMINB12, FOLATE, FERRITIN, TIBC, IRON, RETICCTPCT in the last 72 hours. Urine analysis:    Component Value Date/Time   COLORURINE YELLOW 07/07/2019 1729   APPEARANCEUR HAZY (A) 07/07/2019 1729   LABSPEC 1.016 07/07/2019 1729   PHURINE 5.0 07/07/2019 1729  GLUCOSEU NEGATIVE 07/07/2019 1729   HGBUR NEGATIVE 07/07/2019 1729   BILIRUBINUR NEGATIVE 07/07/2019 1729   KETONESUR NEGATIVE 07/07/2019 1729   PROTEINUR 30 (A) 07/07/2019 1729   UROBILINOGEN 1.0 10/28/2008 0458   NITRITE NEGATIVE 07/07/2019 1729   LEUKOCYTESUR NEGATIVE 07/07/2019 1729   No results found for this or any previous visit (from the past 240 hour(s)).    Radiology Studies: CT CHEST W CONTRAST  Result Date: 08/17/2019 CLINICAL DATA:  Laryngeal cancer, lung mass. EXAM: CT CHEST WITH CONTRAST TECHNIQUE: Multidetector CT imaging of the chest was performed during intravenous contrast administration. CONTRAST:  17m OMNIPAQUE IOHEXOL 300 MG/ML  SOLN COMPARISON:  07/07/2019. FINDINGS: Cardiovascular: Coronary artery calcification. Heart size normal. No pericardial effusion. Mediastinum/Nodes: Hypopharyngeal mass is incidentally imaged. No pathologically enlarged mediastinal, hilar or axillary lymph nodes. Esophagus is grossly  unremarkable. Lungs/Pleura: Centrilobular emphysema. Mild scarring in the apices. Persistent 2.1 x 2.8 cm low-attenuation lesion in the superior segment left lower lobe, stable in size from 07/07/2019. Surrounding collapse/consolidation in the left lower lobe. Basilar predominant peribronchovascular nodularity, ground-glass and consolidation seen on 07/07/2019 have otherwise largely resolved. No pleural fluid. Tracheostomy is in place. Airway is otherwise unremarkable. Upper Abdomen: Visualized portions of the liver, adrenal glands, kidneys, spleen, pancreas, stomach and bowel are grossly unremarkable. Percutaneous gastrostomy tube is partially imaged. Musculoskeletal: No worrisome lytic or sclerotic lesions. IMPRESSION: 1. Low-attenuation lesion in the superior segment left lower lobe, unchanged from 07/07/2019 and worrisome for malignancy. Residual surrounding collapse/consolidation in the left lower lobe. 2. Basilar predominant peribronchovascular nodularity, ground-glass and consolidation seen on 07/07/2019 have otherwise largely resolved. 3. Hypopharyngeal mass, consistent with the given history of laryngeal cancer. 4. Coronary artery calcification. 5.  Emphysema (ICD10-J43.9). Electronically Signed   By: MLorin PicketM.D.   On: 08/17/2019 11:57    Scheduled Meds:  ascorbic acid  100 mg Per Tube BID   chlorhexidine gluconate (MEDLINE KIT)  15 mL Mouth Rinse BID   Chlorhexidine Gluconate Cloth  6 each Topical Daily   docusate  100 mg Oral BID   feeding supplement (OSMOLITE 1.5 CAL)  237 mL Per Tube 5 X Daily   feeding supplement (PRO-STAT SUGAR FREE 64)  30 mL Per Tube BID   ferrous sulfate  300 mg Per Tube BID WC   free water  120 mL Per Tube 5 X Daily   mouth rinse  15 mL Mouth Rinse 10 times per day   metoprolol tartrate  25 mg Per Tube BID   pantoprazole sodium  40 mg Per Tube Q24H   polyethylene glycol  17 g Oral Daily   senna-docusate  1 tablet Oral BID   sertraline  50 mg  Per Tube Daily   thiamine  100 mg Per Tube Daily   Continuous Infusions:   LOS: 42 days   Time spent: 15 minutes.  RPatrecia Pour MD [facility] [email or web address] 08/18/2019, 10:50 AM

## 2019-08-19 ENCOUNTER — Ambulatory Visit
Admit: 2019-08-19 | Discharge: 2019-08-19 | Disposition: A | Discharge status: Home / Self Care | Payer: Medicaid Other | Attending: Radiation Oncology | Admitting: Radiation Oncology

## 2019-08-19 NOTE — Progress Notes (Signed)
Devon Peterson  [id number] DOB: [birth year] DOA: 07/07/2019 PCP: Patient, No Pcp Per    Brief Narrative:  64 year old with a history of COPD, tobacco abuse, and cocaine abuse who presented Jul 07, 2019 with aspiration pneumonia and was found to have a hypopharyngeal mass.  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  He has suffered a prolonged hospitalization since.    A G-tube was placed Jul 14, 2019.  He suffered tracheostomy bleeding Jul 26, 2019 which fortunately resolved without further intervention.  On May 24 he required transfer to the ICU with acute respiratory distress and was placed on the ventilator via his trach.  On May 27 he suffered an episode of atrial flutter which was successfully treated with amiodarone boluses only.  He was able to be transferred off of the ICU service August 05, 2019   Antimicrobials:  Unasyn 5/4 > 5/11  Subjective: Resting comfortably in bed.  Denies shortness of breath chest pain nausea or vomiting.  States he is feeling good today.  Assessment & Plan:  Multifactorial tracheostomy dependent acute hypoxic respiratory failure Tracheostomy care per ENT/PCCM - no plans for decannulation given laryngeal cancer  Aspiration pneumonia Has completed the course of antibiotic therapy  Squamous cell carcinoma of the larynx with left lower lung mass Currently undergoing XRT which will continue through 6/18 -oncology following  Dysphagia with cachexia/severe protein calorie malnutrition Due to above -status post G-tube placement 07/14/2019  Hypomagnesemia Recheck in a.m.  COPD/emphysema No acute exacerbation today  Anemia of critical illness and chronic disease with iron deficiency  Paroxysmal/transient atrial flutter  Stage II sacral pressure ulcer -present on admission Pressure Injury 07/07/19 Sacrum Mid;Upper Stage 2 -  Partial  thickness loss of dermis presenting as a shallow open injury with a red, pink wound bed without slough. 3 each small separate open areas (Active)  07/07/19 2107  Location: Sacrum  Location Orientation: Mid;Upper  Staging: Stage 2 -  Partial thickness loss of dermis presenting as a shallow open injury with a red, pink wound bed without slough.  Wound Description (Comments): 3 each small separate open areas  Present on Admission: Yes   Depression  Severe protein calorie malnutrition  DVT prophylaxis: SCDs Code Status: FULL CODE Family Communication:  Status is: Inpatient  Remains inpatient appropriate because:IV treatments appropriate due to intensity of illness or inability to take PO   Dispo: The patient is from: Home              Anticipated d/c is to: Home              Anticipated d/c date is: > 3 days              Patient currently is not medically stable to d/c.   Consultants:  PCCM ENT Oncology XRT   Objective: Blood pressure 108/74, pulse 70, temperature 98.1 F (36.7 C), temperature source Oral, resp. rate 15, height _0  (1.803 m), weight 42.2 kg, SpO2 100 %.  Intake/Output Summary (Last 24 hours) at 08/19/2019 1008 Last data filed at 08/19/2019 0645 Gross per 24 hour  Intake --  Output 1275 ml  Net -1275 ml   Filed Weights   07/09/19 0949 07/20/19 1600 08/10/19 1225  Weight: 42 kg 44 kg 42.2 kg    Examination: General: No acute respiratory distress Lungs: Clear to auscultation bilaterally without wheezes or crackles Cardiovascular: Regular rate and  rhythm without murmur gallop or rub normal S1 and S2 Abdomen: Nontender, nondistended, soft, bowel sounds positive, no rebound, no ascites, no appreciable mass Extremities: No significant cyanosis, clubbing, or edema bilateral lower extremities  CBC: Recent Labs  Lab 08/17/19 0432  WBC 3.3*  HGB 10.1*  HCT 30.7*  MCV 86.5  PLT 412   Basic Metabolic Panel: Recent Labs  Lab 08/17/19 0432  NA 136   K 4.3  CL 97*  CO2 27  GLUCOSE 86  BUN 21  CREATININE 0.45*  CALCIUM 9.0   GFR: Estimated Creatinine Clearance: 55.7 mL/min (A) (by C-G formula based on SCr of 0.45 mg/dL (L)).  Liver Function Tests: Recent Labs  Lab 08/17/19 0432  AST 15  ALT 12  ALKPHOS 60  BILITOT 0.2*  PROT 6.7  ALBUMIN 3.1*    CBG: Recent Labs  Lab 08/14/19 0005 08/14/19 0747 08/14/19 1609 08/15/19 0004 08/15/19 0409  GLUCAP 148* 79 87 105* 85     Scheduled Meds: . ascorbic acid  100 mg Per Tube BID  . chlorhexidine gluconate (MEDLINE KIT)  15 mL Mouth Rinse BID  . Chlorhexidine Gluconate Cloth  6 each Topical Daily  . docusate  100 mg Oral BID  . feeding supplement (OSMOLITE 1.5 CAL)  237 mL Per Tube 5 X Daily  . feeding supplement (PRO-STAT SUGAR FREE 64)  30 mL Per Tube BID  . ferrous sulfate  300 mg Per Tube BID WC  . free water  120 mL Per Tube 5 X Daily  . mouth rinse  15 mL Mouth Rinse 10 times per day  . metoprolol tartrate  25 mg Per Tube BID  . pantoprazole sodium  40 mg Per Tube Q24H  . polyethylene glycol  17 g Oral Daily  . senna-docusate  1 tablet Oral BID  . sertraline  50 mg Per Tube Daily  . thiamine  100 mg Per Tube Daily     LOS: 30 days   Cherene Altes, MD [facility] Office  [phone] Pager - Text Page per Amion  If 7PM-7AM, please contact night-coverage per Amion 08/19/2019, 10:08 AM

## 2019-08-19 NOTE — Progress Notes (Signed)
Nutrition Follow-up  DOCUMENTATION CODES:   Severe malnutrition in context of chronic illness, Underweight  INTERVENTION:  - continue 1 carton (237 ml) Osmolite 1.5 x5/day with 30 ml prostat (or equivalent) BID and 120 ml free water/TF bolus.  - weigh patient today.   NUTRITION DIAGNOSIS:   Severe Malnutrition related to chronic illness (COPD) as evidenced by severe fat depletion, severe muscle depletion. -ongoing  GOAL:   Patient will meet greater than or equal to 90% of their needs -met with TF regimen  MONITOR:   TF tolerance, Diet advancement, Labs, Weight trends, Skin  ASSESSMENT:   64 year old male with past medical history of COPD, polysubstance abuse, recent admission 2/12-2/14 for sepsis secondary to pneumonia and COPD exacerbation presented with SOB, fatigue, and unintentional wt loss. Family reports patient has done poorly s/p returning home from hospital, has difficulty swallowing food, spitting up saliva, and has chronic cough. CXR revealed new left lower lobe airspace opacity concerning for pneumonia, CT angiogram showed abnormal soft tissue in hypopharynx suspicious for underlying mass, and dense bilateral lower lobe consolidation.  Significant Events: 5/4- admission 5/5- initial RD assessment 5/6- trach in OR 5/11- PEG placed in IR 5/12- TF initiation 5/20- first XRT treatment 5/21- changed from continuous to bolus TF 5/24- Rapid Response for tachypnea and labored respirations; intubated via trach 5/28- taken off of vent and placed back on trach collar 6/5- transferred from 2W to 4W   Patient remains NPO and has PEG in place. He is receiving 1 carton (237 ml) Osmolite 1.5 x5/day with 30 ml prostat BID and 120 ml free water/TF bolus. This regimen provides 1975 kcal, 104 grams protein, and 1505 ml free water.  He has not been weighed since 6/7.  Per notes: - plan for bronch on 6/17 at 0930 and to be NPO at midnight for this - XRT until 6/18 - overall  prognosis is guarded to poor; remains Full Code - anticipated d/c to SNF on 6/18    Labs reviewed; Cl: 97 mmol/l, creatinine: 0.45 mg/dl. Medications reviewed; 100 mg ascorbic acid BID, 100 mg colace BID, 300 mg ferrous sulfate BID, 17 g miralax/day, 1 tablet senokot BID, 100 mg thiamine/day.    Diet Order:   Diet Order            Diet NPO time specified  Diet effective now           Diet NPO time specified Except for: Ice Chips  Diet effective now                 EDUCATION NEEDS:   No education needs have been identified at this time  Skin:  Skin Assessment: Skin Integrity Issues: Skin Integrity Issues:: Stage II, Incisions Stage II: mid sacrum Incisions: neck for trach (5/6)  Last BM:  6/16  Height:   Ht Readings from Last 1 Encounters:  07/27/19 '5\' 11"'$  (1.803 m)    Weight:   Wt Readings from Last 1 Encounters:  08/10/19 42.2 kg    Estimated Nutritional Needs:  Kcal:  1800-2050 kcal Protein:  80-100 Fluid:  >/= 1.8 L/day     Jarome Matin, MS, RD, LDN, CNSC Inpatient Clinical Dietitian RD pager # available in AMION  After hours/weekend pager # available in [facility]

## 2019-08-20 ENCOUNTER — Ambulatory Visit
Admit: 2019-08-20 | Discharge: 2019-08-20 | Disposition: A | Discharge status: Home / Self Care | Payer: Medicaid Other | Attending: Radiation Oncology | Admitting: Radiation Oncology

## 2019-08-20 ENCOUNTER — Encounter (HOSPITAL_COMMUNITY)
Admission: EM | Disposition: A | Discharge status: Skilled Nursing Facility | Payer: Self-pay | Source: Home / Self Care | Attending: Internal Medicine

## 2019-08-20 ENCOUNTER — Inpatient Hospital Stay (HOSPITAL_COMMUNITY): Payer: Medicaid Other | Admitting: Certified Registered Nurse Anesthetist

## 2019-08-20 ENCOUNTER — Ambulatory Visit: Payer: Medicaid Other

## 2019-08-20 ENCOUNTER — Encounter (HOSPITAL_COMMUNITY): Payer: Self-pay | Admitting: Internal Medicine

## 2019-08-20 ENCOUNTER — Inpatient Hospital Stay (HOSPITAL_COMMUNITY): Payer: Medicaid Other

## 2019-08-20 HISTORY — PX: VIDEO BRONCHOSCOPY: SHX5072

## 2019-08-20 HISTORY — PX: BRONCHIAL WASHINGS: SHX5105

## 2019-08-20 HISTORY — PX: BIOPSY: SHX5522

## 2019-08-20 LAB — GLUCOSE, CAPILLARY: Glucose-Capillary: 80 mg/dL (ref 70–99)

## 2019-08-20 SURGERY — BRONCHOSCOPY, WITH FLUOROSCOPY
Anesthesia: Monitor Anesthesia Care | Laterality: Left

## 2019-08-20 MED ORDER — DOCUSATE SODIUM 50 MG/5ML PO LIQD
100.0000 mg | Freq: Two times a day (BID) | ORAL | Status: DC
  Administered 2019-08-20 – 2019-08-28 (×15): 100 mg
  Filled 2019-08-20 (×17): qty 10

## 2019-08-20 MED ORDER — PROPOFOL 500 MG/50ML IV EMUL
INTRAVENOUS | Status: AC
  Filled 2019-08-20: qty 50

## 2019-08-20 MED ORDER — LIDOCAINE 2% (20 MG/ML) 5 ML SYRINGE
INTRAMUSCULAR | Status: DC | PRN
  Administered 2019-08-20: 40 mg via INTRAVENOUS

## 2019-08-20 MED ORDER — POLYETHYLENE GLYCOL 3350 17 G PO PACK
17.0000 g | PACK | Freq: Every day | ORAL | Status: DC
  Administered 2019-08-21 – 2019-08-27 (×7): 17 g
  Filled 2019-08-20 (×9): qty 1

## 2019-08-20 MED ORDER — PROPOFOL 10 MG/ML IV BOLUS
INTRAVENOUS | Status: DC | PRN
  Administered 2019-08-20: 30 mg via INTRAVENOUS
  Administered 2019-08-20: 50 mg via INTRAVENOUS
  Administered 2019-08-20: 30 mg via INTRAVENOUS

## 2019-08-20 MED ORDER — PROPOFOL 500 MG/50ML IV EMUL
INTRAVENOUS | Status: DC | PRN
  Administered 2019-08-20: 150 ug/kg/min via INTRAVENOUS

## 2019-08-20 MED ORDER — LIDOCAINE HCL URETHRAL/MUCOSAL 2 % EX GEL
1.0000 "application " | Freq: Once | CUTANEOUS | Status: DC
  Filled 2019-08-20: qty 5

## 2019-08-20 MED ORDER — SENNOSIDES-DOCUSATE SODIUM 8.6-50 MG PO TABS
1.0000 | ORAL_TABLET | Freq: Two times a day (BID) | ORAL | Status: DC
  Administered 2019-08-20 – 2019-08-28 (×15): 1
  Filled 2019-08-20 (×15): qty 1

## 2019-08-20 MED ORDER — PHENYLEPHRINE 40 MCG/ML (10ML) SYRINGE FOR IV PUSH (FOR BLOOD PRESSURE SUPPORT)
PREFILLED_SYRINGE | INTRAVENOUS | Status: DC | PRN
  Administered 2019-08-20 (×2): 120 ug via INTRAVENOUS

## 2019-08-20 MED ORDER — BUTAMBEN-TETRACAINE-BENZOCAINE 2-2-14 % EX AERO: 1.0000 | INHALATION_SPRAY | Freq: Once | CUTANEOUS | Status: DC

## 2019-08-20 MED ORDER — PROPOFOL 10 MG/ML IV BOLUS
INTRAVENOUS | Status: AC
  Filled 2019-08-20: qty 20

## 2019-08-20 MED ORDER — SODIUM CHLORIDE 0.9 % IV SOLN: INTRAVENOUS | Status: DC | PRN

## 2019-08-20 MED ORDER — PHENYLEPHRINE HCL 0.25 % NA SOLN: 1.0000 | Freq: Four times a day (QID) | NASAL | Status: DC | PRN

## 2019-08-20 NOTE — Progress Notes (Signed)
Pt complaining of 10/10 pain in neck.  Pt intermittently falling asleep.  History of pain 10/10 in neck on floor.  Bedside RN aware of trach manipulation during procedure.  Bedside RN made aware and will continue to monitor pt.

## 2019-08-20 NOTE — Op Note (Signed)
[facility] Cardiopulmonary Patient Name: Devon Peterson Procedure Date: 08/20/2019 MRN: [id number] Attending MD: Chesley Mires , MD Date of Birth: 1955-04-16 CSN: [id number] Age: 64 Admit Type: Inpatient Ethnicity: Not Hispanic or Latino Procedure:             Bronchoscopy Indications:           Left lower lobe mass Providers:             Chesley Mires, MD, Cleda Daub, RN, Josie Dixon,                         RN, Tyrone Apple, Technician, Laverda Sorenson,                         Technician, Adair Laundry, CRNA Referring MD:           Medicines:              Complications:         No immediate complications Estimated Blood Loss:  Estimated blood loss: none. Procedure:      Pre-Anesthesia Assessment:      - A History and Physical has been performed. The patient's medications,       allergies and sensitivities have been reviewed.      - The risks and benefits of the procedure and the sedation options and       risks were discussed with the patient. All questions were answered and       informed consent was obtained.      After obtaining informed consent, the bronchoscope was passed under       direct vision. Throughout the procedure, the patient's blood pressure,       pulse, and oxygen saturations were monitored continuously. the BF-H190       ([id number]) Olympus Bronchoscope was introduced through the tracheostomy       tube and advanced to the tracheobronchial tree of both lungs. The       procedure was accomplished without difficulty. Total fluoroscopy time       was 0 minutes, 43 minutes, 43 seconds. Findings:      Patient had #6 tracheostomy tube. This was changed to a #8 endotracheal       tube passed through the tracheostomy stoma to allow the bronchoscope to       be passed.      The bronchoscope was entered into the trachea and advanced to the       carina. There was some debris in the airway likely from sloughing of       upper airway mass after  radiation therapy. This was suctioned from the       area.      The bronchoscope was then entered into the right main bronchus. The       right upper, middle and lower lobe orifices were visualized. Mucosa       normal, and no endobronchial lesions.      Bronchoscope entered into the left main bronchus. The left upper and       lingular lobes were visualized. The left lower lobe was obstructed with       mucus plug. This was easily suctioned after instilling saline. The left       lower lobe was then easily visualized.      The bronchoscope was wedged into the superior segment of the left lower  lobe. Instilled 60 ml of saline with 15 ml of cloudy, white fluid       returned.      Then using fluorscopic guidance, made several transbronchial biopsies       from the superior segment of the left lower lobe.      The bronchoscope was then withdrawn. Impression:      - Left lower lobe mass      - No specimens collected. Moderate Sedation:      Moderate (conscious) sedation was personally administered by an       anesthesia professional. The following parameters were monitored: oxygen       saturation, heart rate, blood pressure, and response to care. Recommendation:      - Await BAL, biopsy and culture results. Procedure Code(s):      --- Professional ---      [phone], Bronchoscopy, rigid or flexible, including fluoroscopic guidance,       when performed; with transbronchial lung biopsy(s), single lobe      16384, Bronchoscopy, rigid or flexible, including fluoroscopic guidance,       when performed; with bronchial alveolar lavage Diagnosis Code(s):      --- Professional ---      R91.8, Other nonspecific abnormal finding of lung field CPT copyright 2019 American Medical Association. All rights reserved. The codes documented in this report are preliminary and upon coder review may  be revised to meet current compliance requirements. Chesley Mires, MD Chesley Mires, MD 08/20/2019  11:09:24 AM This report has been signed electronically. Number of Addenda: 0 Scope In: Scope Out:

## 2019-08-20 NOTE — Anesthesia Preprocedure Evaluation (Signed)
Anesthesia Evaluation  Patient identified by MRN, date of birth, ID band Patient awake    Reviewed: Allergy & Precautions, NPO status , Patient's Chart, lab work & pertinent test results, reviewed documented beta blocker date and time   Airway Mallampati: Trach       Dental  (+) Edentulous Upper, Edentulous Lower   Pulmonary COPD,  COPD inhaler, Current Smoker and Patient abstained from smoking.,    + rhonchi        Cardiovascular Normal cardiovascular exam Rhythm:Regular Rate:Normal     Neuro/Psych    GI/Hepatic Neg liver ROS, GERD  Medicated,  Endo/Other  negative endocrine ROS  Renal/GU negative Renal ROS  negative genitourinary   Musculoskeletal negative musculoskeletal ROS (+)   Abdominal (+) + scaphoid   Peds  Hematology  (+) Blood dyscrasia, anemia ,   Anesthesia Other Findings   Reproductive/Obstetrics                             Anesthesia Physical Anesthesia Plan  ASA: III  Anesthesia Plan: MAC   Post-op Pain Management:    Induction:   PONV Risk Score and Plan: Propofol infusion and TIVA  Airway Management Planned: Tracheostomy  Additional Equipment: None  Intra-op Plan:   Post-operative Plan:   Informed Consent: I have reviewed the patients History and Physical, chart, labs and discussed the procedure including the risks, benefits and alternatives for the proposed anesthesia with the patient or authorized representative who has indicated his/her understanding and acceptance.       Plan Discussed with:   Anesthesia Plan Comments:         Anesthesia Quick Evaluation

## 2019-08-20 NOTE — Progress Notes (Signed)
Devon Peterson  [id number] DOB: 1956-02-12 DOA: 07/07/2019 PCP: Patient, No Pcp Per    Brief Narrative:  64 year old with a history of COPD, tobacco abuse, and cocaine abuse who presented Jul 07, 2019 with aspiration pneumonia and was found to have a hypopharyngeal mass.  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  He has suffered a prolonged hospitalization since.    A G-tube was placed Jul 14, 2019.  He suffered tracheostomy bleeding Jul 26, 2019 which fortunately resolved without further intervention.  On May 24 he required transfer to the ICU with acute respiratory distress and was placed on the ventilator via his trach.  On May 27 he suffered an episode of atrial flutter which was successfully treated with amiodarone boluses only.  He was able to be transferred off of the ICU service August 05, 2019   Antimicrobials:  Unasyn 5/4 > 5/11  Subjective: Patient was seen in his room post bronchoscopy.  Complains of some throat pain but otherwise denies any new complaints.  Appears stable at time of my exam.  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  COPD/emphysema No acute exacerbation today  Anemia of critical illness and chronic disease with iron deficiency  Paroxysmal/transient atrial flutter  Stage II sacral pressure ulcer -present on admission Pressure Injury 07/07/19 Sacrum  Mid;Upper Stage 2 -  Partial thickness loss of dermis presenting as a shallow open injury with a red, pink wound bed without slough. 3 each small separate open areas (Active)  07/07/19 2107  Location: Sacrum  Location Orientation: Mid;Upper  Staging: Stage 2 -  Partial thickness loss of dermis presenting as a shallow open injury with a red, pink wound bed without slough.  Wound Description (Comments): 3 each small separate open areas  Present on Admission: Yes   Depression  Severe protein calorie malnutrition  DVT prophylaxis: SCDs Code Status: FULL CODE Family Communication:  Status is: Inpatient  Remains inpatient appropriate because:IV treatments appropriate due to intensity of illness or inability to take PO   Dispo: The patient is from: Home              Anticipated d/c is to: Home              Anticipated d/c date is: > 3 days              Patient currently is not medically stable to d/c.   Consultants:  PCCM ENT Oncology XRT   Objective: Blood pressure 112/80, pulse 98, temperature 98.1 F (36.7 C), temperature source Axillary, resp. rate 16, height '5\' 11"'$  (1.803 m), weight 42.2 kg, SpO2 100 %.  Intake/Output Summary (Last 24 hours) at 08/20/2019 0903 Last data filed at 08/20/2019 0500 Gross per 24 hour  Intake 357 ml  Output 1750 ml  Net -1393 ml   Filed Weights   07/09/19 0949 07/20/19 1600 08/10/19 1225  Weight: 42 kg 44 kg 42.2 kg    Examination: General: No acute respiratory distress Lungs: Fine crackles bilateral bases  Cardiovascular: RRR without murmur Abdomen: Thin, soft, bowel sounds positive Extremities: Cachectic, no edema  CBC: Recent Labs  Lab 08/17/19 0432  WBC 3.3*  HGB 10.1*  HCT 30.7*  MCV 86.5  PLT 290   Basic Metabolic Panel: Recent Labs  Lab 08/17/19 0432  NA 136  K 4.3  CL 97*  CO2 27  GLUCOSE 86  BUN 21  CREATININE 0.45*  CALCIUM 9.0   GFR: Estimated Creatinine Clearance: 55.7 mL/min (A) (by C-G formula based  on SCr of 0.45 mg/dL (L)).  Liver Function Tests: Recent Labs  Lab 08/17/19 0432  AST 15  ALT 12  ALKPHOS 60  BILITOT 0.2*  PROT 6.7  ALBUMIN 3.1*    CBG: Recent Labs  Lab 08/14/19 0005 08/14/19 0747 08/14/19 1609 08/15/19 0004 08/15/19 0409  GLUCAP 148* 79 87 105* 85     Scheduled Meds: . [MAR Hold] ascorbic acid  100 mg Per Tube BID  . butamben-tetracaine-benzocaine  1 spray Topical Once  . [MAR Hold] chlorhexidine gluconate (MEDLINE KIT)  15 mL Mouth Rinse BID  . [MAR Hold] Chlorhexidine Gluconate Cloth  6 each Topical Daily  . [MAR Hold] docusate  100 mg Oral BID  . [MAR Hold] feeding supplement (OSMOLITE 1.5 CAL)  237 mL Per Tube 5 X Daily  . [MAR Hold] feeding supplement (PRO-STAT SUGAR FREE 64)  30 mL Per Tube BID  . [MAR Hold] ferrous sulfate  300 mg Per Tube BID WC  . [MAR Hold] free water  120 mL Per Tube 5 X Daily  . lidocaine  1 application Topical Once  . [MAR Hold] mouth rinse  15 mL Mouth Rinse 10 times per day  . [MAR Hold] metoprolol tartrate  25 mg Per Tube BID  . [MAR Hold] pantoprazole sodium  40 mg Per Tube Q24H  . [MAR Hold] polyethylene glycol  17 g Oral Daily  . [MAR Hold] senna-docusate  1 tablet Oral BID  . [MAR Hold] sertraline  50 mg Per Tube Daily  . [MAR Hold] thiamine  100 mg Per Tube Daily     LOS: 66 days   Cherene Altes, MD [facility] Office  [phone] Pager - Text Page per Amion  If 7PM-7AM, please contact night-coverage per Amion 08/20/2019, 9:03 AM

## 2019-08-20 NOTE — Transfer of Care (Signed)
Immediate Anesthesia Transfer of Care Note  Patient: Devon Peterson  Procedure(s) Performed: VIDEO BRONCHOSCOPY WITH FLUORO (Left ) BRONCHIAL WASHINGS BIOPSY  Patient Location: Endoscopy Unit  Anesthesia Type:MAC  Level of Consciousness: drowsy and patient cooperative  Airway & Oxygen Therapy: Patient connected to tracheostomy mask oxygen  Post-op Assessment: Report given to RN and Post -op Vital signs reviewed and stable  Post vital signs: Reviewed and stable  Last Vitals:  Vitals Value Taken Time  BP 79/58 08/20/19 1105  Temp    Pulse 77 08/20/19 1108  Resp 15 08/20/19 1108  SpO2 98 % 08/20/19 1108  Vitals shown include unvalidated device data.  Last Pain:  Vitals:   08/20/19 0914  TempSrc: Oral  PainSc: 0-No pain      Patients Stated Pain Goal: 0 (09/62/83 6629)  Complications: No complications documented.

## 2019-08-20 NOTE — Progress Notes (Signed)
OT Cancellation Note  Patient Details Name: Devon Peterson MRN: [id number] DOB: [birth year]   Cancelled Treatment:    Reason Eval/Treat Not Completed: Pain limiting ability to participate (Patient complaining of pain and declines therapy. will follow up as able.)  Devon Peterson 08/20/2019, 12:26 PM

## 2019-08-21 ENCOUNTER — Ambulatory Visit
Admit: 2019-08-21 | Discharge: 2019-08-21 | Disposition: A | Discharge status: Home / Self Care | Payer: Medicaid Other | Attending: Radiation Oncology | Admitting: Radiation Oncology

## 2019-08-21 ENCOUNTER — Encounter: Payer: Self-pay | Admitting: Radiation Oncology

## 2019-08-21 LAB — GLUCOSE, CAPILLARY
Glucose-Capillary: 126 mg/dL — ABNORMAL HIGH (ref 70–99)
Glucose-Capillary: 135 mg/dL — ABNORMAL HIGH (ref 70–99)
Glucose-Capillary: 73 mg/dL (ref 70–99)

## 2019-08-21 LAB — CBC
HCT: 31.4 % — ABNORMAL LOW (ref 39.0–52.0)
Hemoglobin: 10.1 g/dL — ABNORMAL LOW (ref 13.0–17.0)
MCH: 27.6 pg (ref 26.0–34.0)
MCHC: 32.2 g/dL (ref 30.0–36.0)
MCV: 85.8 fL (ref 80.0–100.0)
Platelets: 257 10*3/uL (ref 150–400)
RBC: 3.66 MIL/uL — ABNORMAL LOW (ref 4.22–5.81)
RDW: 14.4 % (ref 11.5–15.5)
WBC: 3.5 10*3/uL — ABNORMAL LOW (ref 4.0–10.5)
nRBC: 0 % (ref 0.0–0.2)

## 2019-08-21 LAB — COMPREHENSIVE METABOLIC PANEL
ALT: 12 U/L (ref 0–44)
AST: 16 U/L (ref 15–41)
Albumin: 3.2 g/dL — ABNORMAL LOW (ref 3.5–5.0)
Alkaline Phosphatase: 62 U/L (ref 38–126)
Anion gap: 8 (ref 5–15)
BUN: 15 mg/dL (ref 8–23)
CO2: 29 mmol/L (ref 22–32)
Calcium: 8.8 mg/dL — ABNORMAL LOW (ref 8.9–10.3)
Chloride: 97 mmol/L — ABNORMAL LOW (ref 98–111)
Creatinine, Ser: 0.37 mg/dL — ABNORMAL LOW (ref 0.61–1.24)
GFR calc Af Amer: >60 mL/min (ref >60)
GFR calc non Af Amer: >60 mL/min (ref >60)
Glucose, Bld: 75 mg/dL (ref 70–99)
Potassium: 3.7 mmol/L (ref 3.5–5.1)
Sodium: 134 mmol/L — ABNORMAL LOW (ref 135–145)
Total Bilirubin: 0.5 mg/dL (ref 0.3–1.2)
Total Protein: 6.8 g/dL (ref 6.5–8.1)

## 2019-08-21 LAB — CYTOLOGY - NON PAP

## 2019-08-21 LAB — ACID FAST SMEAR (AFB, MYCOBACTERIA): Acid Fast Smear: NEGATIVE

## 2019-08-21 LAB — SURGICAL PATHOLOGY

## 2019-08-21 NOTE — TOC Progression Note (Signed)
Transition of Care [facility]) - Progression Note    Patient Details  Name: Devon Peterson MRN: [id number] Date of Birth: [birth year]  Transition of [facility]) CM/SW Contact  Purcell Mouton, RN Phone Number: 08/21/2019, 11:59 AM  Clinical Narrative:    Pt will continue to stay over weekend/MD's. Will continue to follow for SNF placement.    Expected Discharge Plan: Skilled Nursing Facility Barriers to Discharge: Continued Medical Work up  Expected Discharge Plan and Services Expected Discharge Plan: [place] Acute Care Choice: [place]                                         Social Determinants of Health (SDOH) Interventions    Readmission Risk Interventions Readmission Risk Prevention Plan 07/27/2019  Transportation Screening Complete  HRI or [place] Complete  Social Work Consult for [place] Planning/Counseling Complete  Palliative Care Screening Complete  Medication Review Press photographer) Complete  Some recent data might be hidden

## 2019-08-21 NOTE — Progress Notes (Signed)
Pt's trach was suctioned and capped. Aerosol trach collar taken off, with Sat of 100%. Inner cannula was placed in a biohazard bag and placed at the head of bed bedside the obturator. RT will continue to monitor.

## 2019-08-21 NOTE — Progress Notes (Signed)
Devon Peterson  [id number] DOB: 1955/07/18 DOA: 07/07/2019 PCP: Patient, No Pcp Per    Brief Narrative:  64 year old with a history of COPD, tobacco abuse, and cocaine abuse who presented Jul 07, 2019 with aspiration pneumonia and was found to have a hypopharyngeal mass.  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  He has suffered a prolonged hospitalization since.    A G-tube was placed Jul 14, 2019.  He suffered tracheostomy bleeding Jul 26, 2019 which fortunately resolved without further intervention.  On May 24 he required transfer to the ICU with acute respiratory distress and was placed on the ventilator via his trach.  On May 27 he suffered an episode of atrial flutter which was successfully treated with amiodarone boluses only.  He was able to be transferred off of the ICU service August 05, 2019   Antimicrobials:  Unasyn 5/4 > 5/11  Subjective: Throat pain much improved. No new complaints today. Denies cp, n/v, or abdom pain.   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  COPD/emphysema Compensated at present   Anemia of critical illness and chronic disease with iron deficiency  Paroxysmal/transient atrial flutter  Stage II sacral pressure ulcer -present on admission Pressure Injury 07/07/19 Sacrum Mid;Upper Stage 2 -  Partial thickness loss of dermis presenting as a shallow open injury with a red, pink wound bed  without slough. 3 each small separate open areas (Active)  07/07/19 2107  Location: Sacrum  Location Orientation: Mid;Upper  Staging: Stage 2 -  Partial thickness loss of dermis presenting as a shallow open injury with a red, pink wound bed without slough.  Wound Description (Comments): 3 each small separate open areas  Present on Admission: Yes   Depression  Severe protein calorie malnutrition  DVT prophylaxis: SCDs Code Status: FULL CODE Family Communication:  Status is: Inpatient  Remains inpatient appropriate because:IV treatments appropriate due to intensity of illness or inability to take PO   Dispo: The patient is from: Home              Anticipated d/c is to: Home              Anticipated d/c date is: > 3 days              Patient currently is not medically stable to d/c.   Consultants:  PCCM ENT Oncology XRT   Objective: Blood pressure 108/77, pulse 79, temperature 97.9 F (36.6 C), temperature source Oral, resp. rate 13, height _0  (1.803 m), weight 42.2 kg, SpO2 100 %. No intake or output data in the 24 hours ending 08/21/19 1855 Filed Weights   07/09/19 0949 07/20/19 1600 08/10/19 1225  Weight: 42 kg 44 kg 42.2 kg    Examination: General: No acute respiratory distress Lungs: Fine crackles bilateral bases w/o change - no wheezing  Cardiovascular: RRR without murmur Abdomen: Thin, soft, bowel sounds positive Extremities: Cachectic, no edema, no cyanosis   CBC: Recent Labs  Lab 08/17/19 0432 08/21/19 0330  WBC 3.3*  3.5*  HGB 10.1* 10.1*  HCT 30.7* 31.4*  MCV 86.5 85.8  PLT 264 672   Basic Metabolic Panel: Recent Labs  Lab 08/17/19 0432 08/21/19 0330  NA 136 134*  K 4.3 3.7  CL 97* 97*  CO2 27 29  GLUCOSE 86 75  BUN 21 15  CREATININE 0.45* 0.37*  CALCIUM 9.0 8.8*   GFR: Estimated Creatinine Clearance: 55.7 mL/min (A) (by C-G formula based on SCr of 0.37 mg/dL (L)).  Liver Function Tests: Recent Labs  Lab 08/17/19 0432  08/21/19 0330  AST 15 16  ALT 12 12  ALKPHOS 60 62  BILITOT 0.2* 0.5  PROT 6.7 6.8  ALBUMIN 3.1* 3.2*    CBG: Recent Labs  Lab 08/15/19 0004 08/15/19 0409 08/20/19 2013 08/21/19 0041 08/21/19 0405  GLUCAP 105* 85 80 135* 73     Scheduled Meds: . ascorbic acid  100 mg Per Tube BID  . chlorhexidine gluconate (MEDLINE KIT)  15 mL Mouth Rinse BID  . Chlorhexidine Gluconate Cloth  6 each Topical Daily  . docusate  100 mg Per Tube BID  . feeding supplement (OSMOLITE 1.5 CAL)  237 mL Per Tube 5 X Daily  . feeding supplement (PRO-STAT SUGAR FREE 64)  30 mL Per Tube BID  . ferrous sulfate  300 mg Per Tube BID WC  . free water  120 mL Per Tube 5 X Daily  . mouth rinse  15 mL Mouth Rinse 10 times per day  . metoprolol tartrate  25 mg Per Tube BID  . pantoprazole sodium  40 mg Per Tube Q24H  . polyethylene glycol  17 g Per Tube Daily  . senna-docusate  1 tablet Per Tube BID  . sertraline  50 mg Per Tube Daily  . thiamine  100 mg Per Tube Daily     LOS: 105 days   Cherene Altes, MD [facility] Office  [phone] Pager - Text Page per Amion  If 7PM-7AM, please contact night-coverage per Amion 08/21/2019, 6:55 PM

## 2019-08-21 NOTE — Progress Notes (Signed)
NAME:  Devon Peterson, MRN:  [id number], DOB:  [birth year], LOS: 58 ADMISSION DATE:  07/07/2019, CONSULTATION DATE:  07/09/19  REFERRING MD:  Alfredia Ferguson, CHIEF COMPLAINT:  Post-operative hypotension   Brief History   64 yo male smoker admitted 5/04 with aspiration pneumonia and found to have hypopharyngeal mass.  Had direct laryngoscopy with biopsy and tracheostomy on 5/06.  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      Past Medical History  COPD, Cocaine abuse  Significant Hospital Events   5/04 admitted 5/05 seen by ENT as initial evaluation underwent flexible laryngoscopy 5/06 OR for direct laryngoscopy + biopsy, and tracheostomy placement, postop for hypotension 5/11 G tube placed 5/23 tracheostomy bleeding >> no interventions needs 5/24 transfer to ICU for respiratory distress; placed on ventilator and changed to #4 cuffed trach (unable to pass #6 cuffed trach); ENT changed to #6 cuffed trach 5/27 episode of a flutter >  Amiodarone boluses only  5/31 Off from mechanical ventilation for 48 hours 6/01 Getting up with physical therapy 6/15 PCCM called back for FOB of LLL 6/17 bronchoscopy 6/18 start trach capping trials, reconsult speech therapy  Consults:  ENT Oncology Radiation oncology Palliative care  Procedures:  Tracheostomy 5/06 >> G tube 5/11 >>   Significant Diagnostic Tests:  CT angio chest 5/04 >> atherosclerosis, severe emphysema, b/l lower lobe consolidation Lt > Rt, 2.7 cm mass LLL CT neck 5/05 >> enhancing hypopharyngeal soft tissue in post cricoid region  Micro Data:  SARS CoV2 PCR 5/04 >> negative Blood 5/04 >> negative Sputum 5/24 >> no org seen > normal flora BAL 6/17 >>  Antimicrobials:  Unasyn 5/04 >> 5/11   Interim history/subjective:  Able to talk around trach and breath w/o difficulty when I occluded trach with my finger temporarily.  Objective   Blood pressure 101/73, pulse 74, temperature 98.5 F (36.9 C), temperature source  Oral, resp. rate 13, height _0  (1.803 m), weight 42.2 kg, SpO2 96 %.    FiO2 (%):  [21 %-28 %] 21 %   Intake/Output Summary (Last 24 hours) at 08/21/2019 1023 Last data filed at 08/20/2019 1300 Gross per 24 hour  Intake 250 ml  Output 600 ml  Net -350 ml   Filed Weights   07/09/19 0949 07/20/19 1600 08/10/19 1225  Weight: 42 kg 44 kg 42.2 kg    Examination:  General - thin Eyes - pupils reactive ENT - trach site clean, #6 cuffless trach in place Cardiac - regular rate/rhythm, no murmur Chest - equal breath sounds b/l, no wheezing or rales Abdomen - soft, non tender, + bowel sounds, G tube in place Extremities - no cyanosis, clubbing, or edema Skin - no rashes Neuro - normal strength, moves extremities, follows commands Psych - normal mood and behavior   Resolved problems:  Aspiration pneumonia Acute hypoxic respiratory failure Transient A fib/flutter new this admission. Hypotension from hypovolemia.  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 - initially required tracheostomy due to upper airway obstruction - might be at point now to consider decannulation - will ask respiratory therapy to start capping trials during the day; if he does well with this over the weekend, then consider capping trach for 24 hrs and then consider decannulation  Lt lower lung mass. - f/u bronchoscopy results from 6/17  COPD with Emphysema. - prn xopenex  Dysphagia. - will ask speech to reassess swallowing and determine if he can start eating again - continue tube feeds  Best practice:  Diet: bolus tube feeds DVT prophylaxis: PAS GI prophylaxis: protonix Mobility: as tolerated Code Status: full code  Disposition: per primary   Labs:   CMP Latest Ref Rng & Units 08/21/2019 08/17/2019 08/12/2019  Glucose 70 - 99 mg/dL 75 86 81  BUN 8 - 23 mg/dL _0 Creatinine 0.61 - 1.24 mg/dL 0.37(L) 0.45(L) 0.41(L)  Sodium 135 - 145 mmol/L 134(L) 136 134(L)    Potassium 3.5 - 5.1 mmol/L 3.7 4.3 4.5  Chloride 98 - 111 mmol/L 97(L) 97(L) 96(L)  CO2 22 - 32 mmol/L _1 Calcium 8.9 - 10.3 mg/dL 8.8(L) 9.0 9.1  Total Protein 6.5 - 8.1 g/dL 6.8 6.7 7.0  Total Bilirubin 0.3 - 1.2 mg/dL 0.5 0.2(L) 0.3  Alkaline Phos 38 - 126 U/L 62 60 66  AST 15 - 41 U/L _2 ALT 0 - 44 U/L _3 CBC Latest Ref Rng & Units 08/21/2019 08/17/2019 08/12/2019  WBC 4.0 - 10.5 K/uL 3.5(L) 3.3(L) 4.3  Hemoglobin 13.0 - 17.0 g/dL 10.1(L) 10.1(L) 10.1(L)  Hematocrit 39 - 52 % 31.4(L) 30.7(L) 31.4(L)  Platelets 150 - 400 K/uL 257 264 275    ABG    Component Value Date/Time   PHART 7.299 (L) 07/27/2019 1655   PCO2ART 60.2 (H) 07/27/2019 1655   PO2ART 32.8 (LL) 07/27/2019 1655   HCO3 28.6 (H) 07/27/2019 1655   O2SAT 44.3 07/27/2019 1655     CBG (last 3)  Recent Labs    08/20/19 2013 08/21/19 0041 08/21/19 0405  GLUCAP 80 135* 73     Signature:  Chesley Mires, MD [place] Pager - [phone] 08/21/2019, 10:29 AM

## 2019-08-21 NOTE — Progress Notes (Signed)
Reeval of swallow received. Pt will need MBS prior to po administration due to impacts of his pharyngeal cancer, XRT with sensorimotor deficits. Will order MBS.  Kathleen Lime, MS [place] Office [phone]

## 2019-08-22 LAB — CBC
HCT: 30.7 % — ABNORMAL LOW (ref 39.0–52.0)
Hemoglobin: 9.9 g/dL — ABNORMAL LOW (ref 13.0–17.0)
MCH: 27.8 pg (ref 26.0–34.0)
MCHC: 32.2 g/dL (ref 30.0–36.0)
MCV: 86.2 fL (ref 80.0–100.0)
Platelets: 248 10*3/uL (ref 150–400)
RBC: 3.56 MIL/uL — ABNORMAL LOW (ref 4.22–5.81)
RDW: 14.3 % (ref 11.5–15.5)
WBC: 3.5 10*3/uL — ABNORMAL LOW (ref 4.0–10.5)
nRBC: 0 % (ref 0.0–0.2)

## 2019-08-22 LAB — BASIC METABOLIC PANEL
Anion gap: 9 (ref 5–15)
BUN: 19 mg/dL (ref 8–23)
CO2: 29 mmol/L (ref 22–32)
Calcium: 8.8 mg/dL — ABNORMAL LOW (ref 8.9–10.3)
Chloride: 96 mmol/L — ABNORMAL LOW (ref 98–111)
Creatinine, Ser: 0.46 mg/dL — ABNORMAL LOW (ref 0.61–1.24)
GFR calc Af Amer: >60 mL/min (ref >60)
GFR calc non Af Amer: >60 mL/min (ref >60)
Glucose, Bld: 133 mg/dL — ABNORMAL HIGH (ref 70–99)
Potassium: 4 mmol/L (ref 3.5–5.1)
Sodium: 134 mmol/L — ABNORMAL LOW (ref 135–145)

## 2019-08-22 LAB — CULTURE, RESPIRATORY W GRAM STAIN: Culture: NORMAL

## 2019-08-22 LAB — GLUCOSE, CAPILLARY
Glucose-Capillary: 151 mg/dL — ABNORMAL HIGH (ref 70–99)
Glucose-Capillary: 143 mg/dL — ABNORMAL HIGH (ref 70–99)

## 2019-08-22 LAB — MAGNESIUM: Magnesium: 1.7 mg/dL (ref 1.7–2.4)

## 2019-08-22 MED ORDER — MAGNESIUM SULFATE 2 GM/50ML IV SOLN
2.0000 g | Freq: Once | INTRAVENOUS | Status: AC
  Administered 2019-08-22: 2 g via INTRAVENOUS
  Filled 2019-08-22: qty 50

## 2019-08-22 NOTE — Progress Notes (Signed)
PT trach capped per MD order Halford Chessman) during daytime hours. PT tolerating well at this time- no distressed noted at this time. Vitals and trach suctioning documented on Flowsheet. RN aware.

## 2019-08-22 NOTE — Progress Notes (Signed)
SLP Cancellation Note  Patient Details Name: Devon Peterson MRN: [id number] DOB: [birth year]   Cancelled treatment:       Reason Eval/Treat Not Completed: Medical issues which prohibited therapy; schedule conflicts; MD agreed to postpone MD until Mon; Pt nutritionally supported w/ PEG; ST will continue efforts.   Elvina Sidle, M.S., CCC-SLP 08/22/2019, 10:41 AM

## 2019-08-22 NOTE — Plan of Care (Signed)

## 2019-08-22 NOTE — Progress Notes (Signed)
Physical Therapy Treatment Patient Details Name: Devon Peterson MRN: [id number] DOB: April 03, 1955 Today's Date: 08/22/2019    History of Present Illness 64 year old with a history of COPD, tobacco abuse, and cocaine abuse who presented Jul 07, 2019 with aspiration pneumonia and was found to have a hypopharyngeal mass.  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  He has suffered a prolonged hospitalization since.    PT Comments    The patient  In bed, coughing up sputum. Patient requesting to not be hurried. Patient did mobilize to bed edge and assisted with Mod assist of 1 and Rw to the recliner. Patient indicates ready to start ambulating. Will have +2 availble next visit. Continue PT.  Follow Up Recommendations  SNF     Equipment Recommendations  None recommended by PT    Recommendations for Other Services       Precautions / Restrictions Precautions Precautions: Fall Precaution Comments: TRACH has been on RA, may need  O2 to amb.Peg, orthostatic    Mobility  Bed Mobility   Bed Mobility: Supine to Sit Rolling: Supervision   Supine to sit: Supervision     General bed mobility comments: patient self assisted  Transfers Overall transfer level: Needs assistance Equipment used: Rolling walker (2 wheeled) Transfers: Sit to/from [facility] Sit to Stand: Min assist Stand pivot transfers: Min assist       General transfer comment: wide base, appeas steppage  Ambulation/Gait                 Stairs             Wheelchair Mobility    Modified Rankin (Stroke Patients Only)       Balance   Sitting-balance support: Bilateral upper extremity supported;Feet supported Sitting balance-Leahy Scale: Good     Standing balance support: Bilateral upper extremity supported;During functional activity Standing balance-Leahy Scale:  Poor Standing balance comment: reliant on B UE external support                            Cognition Arousal/Alertness: Awake/alert Behavior During Therapy: Flat affect;Agitated                                   General Comments: "don't rush me"      Exercises      General Comments        Pertinent Vitals/Pain Pain Assessment: No/denies pain    Home Living                      Prior Function            PT Goals (current goals can now be found in the care plan section) Acute Rehab PT Goals Patient Stated Goal: agreed to mobility PT Goal Formulation: With patient Time For Goal Achievement: 09/05/19 Potential to Achieve Goals: Good Progress towards PT goals: Progressing toward goals    Frequency    Min 2X/week      PT Plan Current plan remains appropriate    Co-evaluation              AM-PAC PT "6 Clicks" Mobility   Outcome Measure  Help needed turning from your back to your side while in a flat bed without using bedrails?: None Help needed  moving from lying on your back to sitting on the side of a flat bed without using bedrails?: None Help needed moving to and from a bed to a chair (including a wheelchair)?: A Lot Help needed standing up from a chair using your arms (e.g., wheelchair or bedside chair)?: A Lot Help needed to walk in hospital room?: A Lot Help needed climbing 3-5 steps with a railing? : Total 6 Click Score: 15    End of Session   Activity Tolerance: Patient tolerated treatment well Patient left: in chair;with call bell/phone within reach;with chair alarm set Nurse Communication: Mobility status PT Visit Diagnosis: Muscle weakness (generalized) (M62.81);Difficulty in walking, not elsewhere classified (R26.2)     Time: 6578-4696 PT Time Calculation (min) (ACUTE ONLY): 32 min  Charges:  $Therapeutic Activity: 23-37 mins                     Tresa Endo PT Acute Rehabilitation Services Pager  [phone] Office [phone]    Claretha Cooper 08/22/2019, 2:39 PM

## 2019-08-22 NOTE — Progress Notes (Signed)
No respiratory distress noted atthis time. PY confirms he is breathing fine at this time. PT also denies need for trach suctioning at this time.

## 2019-08-22 NOTE — Progress Notes (Signed)
No distress noted at this time. PT states he does not need any thing for his breathing at this time. RN aware.

## 2019-08-22 NOTE — Progress Notes (Signed)
Cap removed from trach and inner cannula used.  Patient does not want humidification at this time so airway open to room air.

## 2019-08-22 NOTE — Progress Notes (Signed)
Devon Peterson  [id number] DOB: 1956-02-20 DOA: 07/07/2019 PCP: Patient, No Pcp Per    Brief Narrative:  64 year old with a history of COPD, tobacco abuse, and cocaine abuse who presented Jul 07, 2019 with aspiration pneumonia and was found to have a hypopharyngeal mass.  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  He has suffered a prolonged hospitalization since.    A G-tube was placed Jul 14, 2019.  He suffered tracheostomy bleeding Jul 26, 2019 which fortunately resolved without further intervention.  On May 24 he required transfer to the ICU with acute respiratory distress and was placed on the ventilator via his trach.  On May 27 he suffered an episode of atrial flutter which was successfully treated with amiodarone boluses only.  He was able to be transferred off of the ICU service August 05, 2019   Antimicrobials:  Unasyn 5/4 > 5/11  Subjective: Resting comfortably in bed.  Reports that pain at his trach site is much improved.  Denies shortness of breath or chest pain.  Is tolerating his trach capping trials without significant difficulty thus far.  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 3 each small separate open areas (Active)  07/07/19 2107  Location: Sacrum  Location Orientation: Mid;Upper  Staging: Stage 2 -  Partial thickness loss of dermis presenting as a shallow open injury with a red, pink wound bed without slough.  Wound Description (Comments): 3 each small separate open areas  Present on Admission: Yes   Depression  Severe protein calorie malnutrition  DVT prophylaxis: SCDs Code Status: FULL CODE Family Communication:  Status is: Inpatient  Remains inpatient appropriate because:IV treatments appropriate due to intensity of illness or inability to take PO   Dispo: The patient is from: Home              Anticipated d/c is to: Home              Anticipated d/c date is: > 3 days              Patient currently is not medically stable to d/c.   Consultants:  PCCM ENT Oncology XRT   Objective: Blood pressure 103/71, pulse 71, temperature 98.3 F (36.8 C), temperature source Oral, resp. rate 16, height '5\' 11"'$  (1.803 m), weight 43 kg, SpO2 95 %.  Intake/Output Summary (Last 24 hours) at 08/22/2019 0907 Last data filed at 08/22/2019 0446 Gross per 24 hour  Intake 357 ml  Output 600 ml  Net -243 ml   Filed Weights   07/20/19 1600 08/10/19 1225 08/22/19 0415  Weight: 44 kg 42.2 kg  43 kg    Examination: General: No acute respiratory distress -alert and conversant Lungs: Fine crackles bilateral bases w/o change  Cardiovascular: RRR without murmur Abdomen: Thin, soft, bowel sounds positive Extremities: Cachectic, no edema, no cyanosis   CBC: Recent Labs  Lab 08/17/19 0432 08/21/19 0330 08/22/19 0415  WBC 3.3* 3.5* 3.5*  HGB 10.1* 10.1* 9.9*  HCT 30.7* 31.4* 30.7*  MCV 86.5 85.8 86.2  PLT 264 257 834   Basic Metabolic Panel: Recent  Labs  Lab 08/17/19 0432 08/21/19 0330 08/22/19 0415  NA 136 134* 134*  K 4.3 3.7 4.0  CL 97* 97* 96*  CO2 '27 29 29  '$ GLUCOSE 86 75 133*  BUN '21 15 19  '$ CREATININE 0.45* 0.37* 0.46*  CALCIUM 9.0 8.8* 8.8*  MG  --   --  1.7   GFR: Estimated Creatinine Clearance: 56.7 mL/min (A) (by C-G formula based on SCr of 0.46 mg/dL (L)).  Liver Function Tests: Recent Labs  Lab 08/17/19 0432 08/21/19 0330  AST 15 16  ALT 12 12  ALKPHOS 60 62  BILITOT 0.2* 0.5  PROT 6.7 6.8  ALBUMIN 3.1* 3.2*    CBG: Recent Labs  Lab 08/21/19 0041 08/21/19 0405 08/21/19 2029 08/22/19 0104 08/22/19 0410  GLUCAP 135* 73 126* 151* 143*     Scheduled Meds:  ascorbic acid  100 mg Per Tube BID   chlorhexidine gluconate (MEDLINE KIT)  15 mL Mouth Rinse BID   Chlorhexidine Gluconate Cloth  6 each Topical Daily   docusate  100 mg Per Tube BID   feeding supplement (OSMOLITE 1.5 CAL)  237 mL Per Tube 5 X Daily   feeding supplement (PRO-STAT SUGAR FREE 64)  30 mL Per Tube BID   ferrous sulfate  300 mg Per Tube BID WC   free water  120 mL Per Tube 5 X Daily   mouth rinse  15 mL Mouth Rinse 10 times per day   metoprolol tartrate  25 mg Per Tube BID   pantoprazole sodium  40 mg Per Tube Q24H   polyethylene glycol  17 g Per Tube Daily   senna-docusate  1 tablet Per Tube BID   sertraline  50 mg Per Tube Daily   thiamine  100 mg Per Tube Daily     LOS: 31 days   Cherene Altes, MD [facility] Office  [phone] Pager - Text Page per Shea Evans  If 7PM-7AM, please contact night-coverage per Amion 08/22/2019, 9:07 AM

## 2019-08-23 LAB — MAGNESIUM: Magnesium: 1.8 mg/dL (ref 1.7–2.4)

## 2019-08-23 LAB — COMPREHENSIVE METABOLIC PANEL
ALT: 13 U/L (ref 0–44)
AST: 17 U/L (ref 15–41)
Albumin: 3.2 g/dL — ABNORMAL LOW (ref 3.5–5.0)
Alkaline Phosphatase: 60 U/L (ref 38–126)
Anion gap: 10 (ref 5–15)
BUN: 20 mg/dL (ref 8–23)
CO2: 31 mmol/L (ref 22–32)
Calcium: 9.3 mg/dL (ref 8.9–10.3)
Chloride: 97 mmol/L — ABNORMAL LOW (ref 98–111)
Creatinine, Ser: 0.43 mg/dL — ABNORMAL LOW (ref 0.61–1.24)
GFR calc Af Amer: >60 mL/min (ref >60)
GFR calc non Af Amer: >60 mL/min (ref >60)
Glucose, Bld: 91 mg/dL (ref 70–99)
Potassium: 4.3 mmol/L (ref 3.5–5.1)
Sodium: 138 mmol/L (ref 135–145)
Total Bilirubin: 0.3 mg/dL (ref 0.3–1.2)
Total Protein: 6.6 g/dL (ref 6.5–8.1)

## 2019-08-23 LAB — CBC
HCT: 29.1 % — ABNORMAL LOW (ref 39.0–52.0)
Hemoglobin: 9.3 g/dL — ABNORMAL LOW (ref 13.0–17.0)
MCH: 27.6 pg (ref 26.0–34.0)
MCHC: 32 g/dL (ref 30.0–36.0)
MCV: 86.4 fL (ref 80.0–100.0)
Platelets: 212 10*3/uL (ref 150–400)
RBC: 3.37 MIL/uL — ABNORMAL LOW (ref 4.22–5.81)
RDW: 14.4 % (ref 11.5–15.5)
WBC: 3.1 10*3/uL — ABNORMAL LOW (ref 4.0–10.5)
nRBC: 0 % (ref 0.0–0.2)

## 2019-08-23 NOTE — Progress Notes (Signed)
Devon Peterson  [id number] DOB: 05-04-1955 DOA: 07/07/2019 PCP: Patient, No Pcp Per    Brief Narrative:  64 year old with a history of COPD, tobacco abuse, and cocaine abuse who presented Jul 07, 2019 with aspiration pneumonia and was found to have a hypopharyngeal mass.  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  He has suffered a prolonged hospitalization since.    A G-tube was placed Jul 14, 2019.  He suffered tracheostomy bleeding Jul 26, 2019 which fortunately resolved without further intervention.  On May 24 he required transfer to the ICU with acute respiratory distress and was placed on the ventilator via his trach.  On May 27 he suffered an episode of atrial flutter which was successfully treated with amiodarone boluses only.  He was able to be transferred off of the ICU service August 05, 2019   Antimicrobials:  Unasyn 5/4 > 5/11  Subjective: Resting comfortably in bed today.  His trach capping trials without difficulty.  No new complaints.  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 XRT completed 6/18  Dysphagia with cachexia/severe protein calorie malnutrition Due to above - status post G-tube placement 07/14/2019  Hypomagnesemia Supplement further toward goal of 2.0   COPD/emphysema Compensated at present   Anemia of critical illness and chronic disease with iron deficiency Hemoglobin slowly drifting downward with no evidence of acute gross blood  Paroxysmal/transient atrial flutter Stable at present  Stage II sacral pressure  ulcer -present on admission Pressure Injury 07/07/19 Sacrum Mid;Upper Stage 2 -  Partial thickness loss of dermis presenting as a shallow open injury with a red, pink wound bed without slough. 3 each small separate open areas (Active)  07/07/19 2107  Location: Sacrum  Location Orientation: Mid;Upper  Staging: Stage 2 -  Partial thickness loss of dermis presenting as a shallow open injury with a red, pink wound bed without slough.  Wound Description (Comments): 3 each small separate open areas  Present on Admission: Yes   Depression  Severe protein calorie malnutrition Continue PEG tube feedings  DVT prophylaxis: SCDs Code Status: FULL CODE Family Communication:  Status is: Inpatient  Remains inpatient appropriate because:IV treatments appropriate due to intensity of illness or inability to take PO   Dispo: The patient is from: Home              Anticipated d/c is to: Home              Anticipated d/c date is: > 3 days              Patient currently is not medically stable to d/c.   Consultants:  PCCM ENT Oncology XRT   Objective: Blood pressure 100/73, pulse 69, temperature 98.3 F (36.8 C), temperature source Oral, resp. rate 18, height _0  (1.803 m), weight 43 kg, SpO2 99 %.  Intake/Output Summary (Last 24 hours) at 08/23/2019 0839 Last data filed at 08/22/2019 2004 Gross per 24 hour  Intake 1197 ml  Output 575 ml  Net 622 ml   Filed Weights   07/20/19 1600 08/10/19 1225 08/22/19 0415  Weight: 44 kg 42.2 kg 43  kg    Examination: General: No acute respiratory distress  Lungs: Fine crackles bilateral bases w/o change  Cardiovascular: RRR Abdomen: Thin, soft, bowel sounds positive Extremities: Cachectic, no edema, no cyanosis   CBC: Recent Labs  Lab 08/21/19 0330 08/22/19 0415 08/23/19 0441  WBC 3.5* 3.5* 3.1*  HGB 10.1* 9.9* 9.3*  HCT 31.4* 30.7* 29.1*  MCV 85.8 86.2 86.4  PLT 257 248 837   Basic Metabolic Panel: Recent Labs  Lab 08/21/19 0330  08/22/19 0415 08/23/19 0441  NA 134* 134* 138  K 3.7 4.0 4.3  CL 97* 96* 97*  CO2 _0 GLUCOSE 75 133* 91  BUN _1 CREATININE 0.37* 0.46* 0.43*  CALCIUM 8.8* 8.8* 9.3  MG  --  1.7 1.8   GFR: Estimated Creatinine Clearance: 56.7 mL/min (A) (by C-G formula based on SCr of 0.43 mg/dL (L)).  Liver Function Tests: Recent Labs  Lab 08/17/19 0432 08/21/19 0330 08/23/19 0441  AST _2 ALT _3 ALKPHOS 60 62 60  BILITOT 0.2* 0.5 0.3  PROT 6.7 6.8 6.6  ALBUMIN 3.1* 3.2* 3.2*    CBG: Recent Labs  Lab 08/21/19 0041 08/21/19 0405 08/21/19 2029 08/22/19 0104 08/22/19 0410  GLUCAP 135* 73 126* 151* 143*     Scheduled Meds: . ascorbic acid  100 mg Per Tube BID  . chlorhexidine gluconate (MEDLINE KIT)  15 mL Mouth Rinse BID  . Chlorhexidine Gluconate Cloth  6 each Topical Daily  . docusate  100 mg Per Tube BID  . feeding supplement (OSMOLITE 1.5 CAL)  237 mL Per Tube 5 X Daily  . feeding supplement (PRO-STAT SUGAR FREE 64)  30 mL Per Tube BID  . ferrous sulfate  300 mg Per Tube BID WC  . free water  120 mL Per Tube 5 X Daily  . mouth rinse  15 mL Mouth Rinse 10 times per day  . metoprolol tartrate  25 mg Per Tube BID  . pantoprazole sodium  40 mg Per Tube Q24H  . polyethylene glycol  17 g Per Tube Daily  . senna-docusate  1 tablet Per Tube BID  . sertraline  50 mg Per Tube Daily  . thiamine  100 mg Per Tube Daily     LOS: 37 days   Cherene Altes, MD [facility] Office  [phone] Pager - Text Page per Amion  If 7PM-7AM, please contact night-coverage per Amion 08/23/2019, 8:39 AM

## 2019-08-24 ENCOUNTER — Inpatient Hospital Stay (HOSPITAL_COMMUNITY): Payer: Medicaid Other

## 2019-08-24 LAB — GLUCOSE, CAPILLARY
Glucose-Capillary: 108 mg/dL — ABNORMAL HIGH (ref 70–99)
Glucose-Capillary: 131 mg/dL — ABNORMAL HIGH (ref 70–99)
Glucose-Capillary: 65 mg/dL — ABNORMAL LOW (ref 70–99)
Glucose-Capillary: 86 mg/dL (ref 70–99)

## 2019-08-24 MED ORDER — DEXTROSE 50 % IV SOLN
INTRAVENOUS | Status: AC
  Filled 2019-08-24: qty 50

## 2019-08-24 MED ORDER — DEXTROSE 50 % IV SOLN
12.5000 g | INTRAVENOUS | Status: AC
  Administered 2019-08-24: 12.5 g via INTRAVENOUS

## 2019-08-24 MED ORDER — TRAMADOL HCL 50 MG PO TABS: 50.0000 mg | ORAL_TABLET | Freq: Four times a day (QID) | ORAL | Status: DC | PRN

## 2019-08-24 MED ORDER — OXYCODONE HCL 5 MG/5ML PO SOLN
5.0000 mg | ORAL | Status: DC | PRN
  Administered 2019-08-24 (×2): 5 mg via ORAL
  Administered 2019-08-25 – 2019-08-26 (×3): 10 mg via ORAL
  Administered 2019-08-26: 5 mg via ORAL
  Administered 2019-08-26 – 2019-08-27 (×2): 10 mg via ORAL
  Administered 2019-08-27 – 2019-08-28 (×5): 5 mg via ORAL
  Administered 2019-08-28: 10 mg via ORAL
  Filled 2019-08-24 (×3): qty 5
  Filled 2019-08-24: qty 10
  Filled 2019-08-24 (×2): qty 5
  Filled 2019-08-24: qty 10
  Filled 2019-08-24 (×4): qty 5
  Filled 2019-08-24: qty 10
  Filled 2019-08-24: qty 5
  Filled 2019-08-24: qty 10
  Filled 2019-08-24 (×2): qty 5

## 2019-08-24 MED ORDER — FENTANYL CITRATE (PF) 100 MCG/2ML IJ SOLN: 12.5000 ug | INTRAMUSCULAR | Status: DC | PRN

## 2019-08-24 MED ORDER — ACETAMINOPHEN 160 MG/5ML PO SOLN
650.0000 mg | Freq: Four times a day (QID) | ORAL | Status: DC | PRN
  Administered 2019-08-27: 650 mg via ORAL
  Filled 2019-08-24: qty 20.3

## 2019-08-24 NOTE — Progress Notes (Signed)
Nutrition Follow-up  DOCUMENTATION CODES:   Severe malnutrition in context of chronic illness, Underweight  INTERVENTION:  - continue 1 carton (237 ml) Osmolite 1.5 x5/day with 30 ml prostat BID and 120 ml free water/TF bolus. - further diet advancement as medically feasible.  NUTRITION DIAGNOSIS:   Severe Malnutrition related to chronic illness (COPD) as evidenced by severe fat depletion, severe muscle depletion. -ongoing  GOAL:   Patient will meet greater than or equal to 90% of their needs -met with TF regimen  MONITOR:   TF tolerance, PO intake, Diet advancement, Labs, Weight trends, Skin  ASSESSMENT:   64 year old male with past medical history of COPD, polysubstance abuse, recent admission 2/12-2/14 for sepsis secondary to pneumonia and COPD exacerbation presented with SOB, fatigue, and unintentional wt loss. Family reports patient has done poorly s/p returning home from hospital, has difficulty swallowing food, spitting up saliva, and has chronic cough. CXR revealed new left lower lobe airspace opacity concerning for pneumonia, CT angiogram showed abnormal soft tissue in hypopharynx suspicious for underlying mass, and dense bilateral lower lobe consolidation.  Significant Events: 5/4- admission 5/5- initial RD assessment 5/6- trach in OR 5/11- PEG placed in IR 5/12- TF initiation 5/20- first XRT treatment 5/21- changed from continuous to bolus TF 5/24- Rapid Response for tachypnea and labored respirations; intubated via trach 5/28- taken off of vent and placed back on trach collar 6/5- transferred from [facility] to [place] 6/17- bronch   Patient had MBS today and was subsequently advanced to CLD, thin liquids at 1430; no intakes documented since that time.   He has PEG and is receiving 1 carton (237 ml) Osmolite 1.5 x5/day with 30 ml prostat BID and 120 ml free water/TF bolus. This regimen provides 1975 kcal, 104 grams protein, and 1505 ml free water. Will not adjust TF regimen  until diet advanced to at least FLD and it is known how well patient is consuming oral nutrition.  He was last weighed on 6/19 and weight at that time was stable since admission.    Labs reviewed; Cl: 97 mmol/l, creatinine: 0.43 mg/dl. Medications reviewed; 100 mg ascorbic acid BID, 100 mg colace BID, 300 mg ferrous sulfate BID, 17 g miralax/day, 1 tablet senokot BID, 100 mg thiamine/day.    Diet Order:   Diet Order            Diet clear liquid Room service appropriate? Yes with Assist; Fluid consistency: Thin  Diet effective now                 EDUCATION NEEDS:   No education needs have been identified at this time  Skin:  Skin Assessment: Skin Integrity Issues: Skin Integrity Issues:: Stage II, Incisions Stage II: mid sacrum Incisions: neck for trach (5/6)  Last BM:  6/19  Height:   Ht Readings from Last 1 Encounters:  07/27/19 5' 11" (1.803 m)    Weight:   Wt Readings from Last 1 Encounters:  08/22/19 43 kg     Estimated Nutritional Needs:  Kcal:  1800-2050 kcal Protein:  80-100 Fluid:  >/= 1.8 L/day     Jarome Matin, MS, RD, LDN, CNSC Inpatient Clinical Dietitian RD pager # available in AMION  After hours/weekend pager # available in [facility]

## 2019-08-24 NOTE — Progress Notes (Signed)
Aron Inge  [id number] DOB: 08/16/1955 DOA: 07/07/2019 PCP: Patient, No Pcp Per    Brief Narrative:  64 year old with a history of COPD, tobacco abuse, and cocaine abuse who presented Jul 07, 2019 with aspiration pneumonia and was found to have a hypopharyngeal mass.  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  He has suffered a prolonged hospitalization since.    A G-tube was placed Jul 14, 2019.  He suffered tracheostomy bleeding Jul 26, 2019 which fortunately resolved without further intervention.  On May 24 he required transfer to the ICU with acute respiratory distress and was placed on the ventilator via his trach.  On May 27 he suffered an episode of atrial flutter which was successfully treated with amiodarone boluses only.  He was able to be transferred off of the ICU service August 05, 2019   Antimicrobials:  Unasyn 5/4 > 5/11  Subjective: The patient is resting comfortably in bed.  He is undergoing a follow-up SLP eval today with hopes of being able to advance his diet.  He states his pain is reasonably well controlled but he does have intermittent episodes of exacerbations.  He has tolerated his trach capping trials well.  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 XRT completed 6/18 -ongoing care per oncology  Dysphagia with cachexia/severe protein calorie malnutrition Due to above - status post G-tube placement 07/14/2019 -SLP to reevaluate today to determine if oral intake  safe  Hypomagnesemia Magnesium stable at this time  COPD/emphysema No acute compensation  Anemia of critical illness and chronic disease with iron deficiency Hemoglobin relatively stable -check intermittently  Paroxysmal/transient atrial flutter Stable at present  Stage II sacral pressure ulcer -present on admission Pressure Injury 07/07/19 Sacrum Mid;Upper Stage 2 -  Partial thickness loss of dermis presenting as a shallow open injury with a red, pink wound bed without slough. 3 each small separate open areas (Active)  07/07/19 2107  Location: Sacrum  Location Orientation: Mid;Upper  Staging: Stage 2 -  Partial thickness loss of dermis presenting as a shallow open injury with a red, pink wound bed without slough.  Wound Description (Comments): 3 each small separate open areas  Present on Admission: Yes   Depression  Severe protein calorie malnutrition   DVT prophylaxis: SCDs Code Status: FULL CODE Family Communication:  Status is: Inpatient  Remains inpatient appropriate because:IV treatments appropriate due to intensity of illness or inability to take PO   Dispo: The patient is from: Home              Anticipated d/c is to: SNF              Anticipated d/c date is: ~2 days              Patient currently is not medically stable to d/c.   Consultants:  PCCM ENT Oncology XRT   Objective: Blood pressure 106/74, pulse 71, temperature 98.7 F (37.1 C), temperature source Oral, resp. rate 12, height '5\' 11"'$  (1.803 m), weight 43 kg, SpO2 96 %.  Intake/Output Summary (Last 24  hours) at 08/24/2019 0841 Last data filed at 08/23/2019 2255 Gross per 24 hour  Intake 237 ml  Output 1000 ml  Net -763 ml   Filed Weights   07/20/19 1600 08/10/19 1225 08/22/19 0415  Weight: 44 kg 42.2 kg 43 kg    Examination: General: No acute respiratory distress -alert and conversant Lungs: Fine crackles bilateral bases w/o change -no wheezing Cardiovascular: RRR Abdomen: Thin,  soft, bowel sounds positive Extremities: Cachectic, no edema bilateral lower extremities  CBC: Recent Labs  Lab 08/21/19 0330 08/22/19 0415 08/23/19 0441  WBC 3.5* 3.5* 3.1*  HGB 10.1* 9.9* 9.3*  HCT 31.4* 30.7* 29.1*  MCV 85.8 86.2 86.4  PLT 257 248 599   Basic Metabolic Panel: Recent Labs  Lab 08/21/19 0330 08/22/19 0415 08/23/19 0441  NA 134* 134* 138  K 3.7 4.0 4.3  CL 97* 96* 97*  CO2 '29 29 31  '$ GLUCOSE 75 133* 91  BUN '15 19 20  '$ CREATININE 0.37* 0.46* 0.43*  CALCIUM 8.8* 8.8* 9.3  MG  --  1.7 1.8   GFR: Estimated Creatinine Clearance: 56.7 mL/min (A) (by C-G formula based on SCr of 0.43 mg/dL (L)).  Liver Function Tests: Recent Labs  Lab 08/21/19 0330 08/23/19 0441  AST 16 17  ALT 12 13  ALKPHOS 62 60  BILITOT 0.5 0.3  PROT 6.8 6.6  ALBUMIN 3.2* 3.2*    CBG: Recent Labs  Lab 08/21/19 0405 08/21/19 2029 08/22/19 0104 08/22/19 0410 08/24/19 0052  GLUCAP 73 126* 151* 143* 86     Scheduled Meds: . ascorbic acid  100 mg Per Tube BID  . chlorhexidine gluconate (MEDLINE KIT)  15 mL Mouth Rinse BID  . Chlorhexidine Gluconate Cloth  6 each Topical Daily  . docusate  100 mg Per Tube BID  . feeding supplement (OSMOLITE 1.5 CAL)  237 mL Per Tube 5 X Daily  . feeding supplement (PRO-STAT SUGAR FREE 64)  30 mL Per Tube BID  . ferrous sulfate  300 mg Per Tube BID WC  . free water  120 mL Per Tube 5 X Daily  . mouth rinse  15 mL Mouth Rinse 10 times per day  . metoprolol tartrate  25 mg Per Tube BID  . pantoprazole sodium  40 mg Per Tube Q24H  . polyethylene glycol  17 g Per Tube Daily  . senna-docusate  1 tablet Per Tube BID  . sertraline  50 mg Per Tube Daily  . thiamine  100 mg Per Tube Daily     LOS: 57 days   Cherene Altes, MD [facility] Office  [phone] Pager - Text Page per Amion  If 7PM-7AM, please contact night-coverage per Amion 08/24/2019, 8:41 AM

## 2019-08-24 NOTE — Progress Notes (Signed)
Pt is OTF at this time during his routine trach check.

## 2019-08-24 NOTE — Progress Notes (Signed)
  Speech Language Pathology Treatment: Dysphagia  Patient Details Name: Devon Peterson MRN: [id number] DOB: [birth year] Today's Date: 08/24/2019 Time: 1501-1510 SLP Time Calculation (min) (ACUTE ONLY): 9 min  Assessment / Plan / Recommendation Clinical Impression  SLP visit to review MBS findings, dietary recommendations and mitigation strategies with pt post-MBS.  Provided pt's compensation strategies in writing and reviewed verbally.  Pt did verbalize "Dont baby me" when requested to review aspiration precautions reasoning/indications.  Pt requested apple juice - was given suspension morphine, ice and apple juice. Baseline wet voice that cleared with minimal intake and subtle cough.  Pt was challenged to consume sequential po to tax his respiratory and swallowing system during MBS without severe aspiration noted.   Advised pt to maintain strength of cough and "Hock" for airway protection.    SLP measure pt's jaw ROM to assess for trismus.  His maximal open mouth posture was 42 mm and normal is at least 50 mm.  With pt's permission, SLP will create a trismus device to maximize mandibular opening for improved speech and swallow ability.     SLP uncertain if pt will be compliant with recommendations but fortunately his cough and expectoration abilities are strong.  Will follow up.  Pt with improved comprehension of his dysphagia.     HPI HPI: HPI: 64 yo with progressive dysphagia, appears cachetic, found to have likely aspiration pna per CT - enhancing mass LLL- suspected aspiration.  Imaging studies also concerning for soft tissue mass in hypopharynx. Pt has been a consumer of ETOH - beer and smokes cigarettes. CT neck showed "Enhancing hypopharyngeal soft tissue in the post cricoid region extending superiorly with effacement of the pyriform sinuses and thickening of the aryepiglottic folds."  Pt desired to eat/drink and swallow evaluation had been ordered. SLP advised an MBS to view impact of mass on  pharyngeal swallow. This was completed 07/08/10, prior to trach placement, with recommendation for NPO except ice chips after oral care.      SLP Plan  Continue with current plan of care       Recommendations  Diet recommendations: Thin liquid (clears) Liquids provided via: Cup;Straw Medication Administration: Via alternative means (suspension or via PEG) Supervision: Patient able to self feed Compensations: Slow rate;Small sips/bites;Effortful swallow (cough or clear throat and expectorate on occasion)                Oral Care Recommendations: Oral care BID Follow up Recommendations: Skilled Nursing facility SLP Visit Diagnosis: Dysphagia, oropharyngeal phase (R13.12) Plan: Continue with current plan of care       GO                [name] 08/24/2019, 3:18 PM

## 2019-08-24 NOTE — Progress Notes (Signed)
Hypoglycemic Event  CBG: 65  Treatment: D50 25 mL (12.5 gm)  Symptoms: None  Follow-up CBG: Time:  0929 CBG Result:0929  Possible Reasons for Event: Inadequate meal intake  Comments/MD notified:  Dextrose given - CBG improved.    Devon Peterson

## 2019-08-24 NOTE — TOC Progression Note (Signed)
Transition of Care [facility]) - Progression Note    Patient Details  Name: Devon Peterson MRN: [id number] Date of Birth: Jul 18, 1955  [facility] CM/SW Contact  Purcell Mouton, RN Phone Number: 08/24/2019, 4:19 PM  Clinical Narrative:    Pt's trach is capped. Will continue to follow for discharge plan. CM is very happy for pt. Quality of life.   Expected Discharge Plan: Skilled Nursing Facility Barriers to Discharge: Continued Medical Work up  Expected Discharge Plan and Services Expected Discharge Plan: [place] Acute Care Choice: [place]                                         Social Determinants of Health (SDOH) Interventions    Readmission Risk Interventions Readmission Risk Prevention Plan 07/27/2019  Transportation Screening Complete  HRI or [place] Complete  Social Work Consult for [place] Planning/Counseling Complete  Palliative Care Screening Complete  Medication Review Press photographer) Complete  Some recent data might be hidden

## 2019-08-24 NOTE — Progress Notes (Signed)
Modified Barium Swallow Progress Note  Patient Details  Name: Devon Peterson MRN: [id number] Date of Birth: 1955/05/22  Today's Date: 08/24/2019  Modified Barium Swallow completed.  Full report located under Chart Review in the Imaging Section.  Brief recommendations include the following:  Clinical Impression  Significantly improved swallow function compared to prior test likely due to significant decrease in size of hypopharyngeal mass from XRT. He continues with mildly decreased oral coordination with lingual pumping - more pronounced with puree/cracker than liquids.  Pt does continue with impaired laryngeal elevation/closure resulting in trace aspiration of secretions mostly silent in nature.  Cued "fast and strong" with double swallows decreased retention and assisted to sustain airway closure.  Cued cough/expectoration effective to clear pharyngeal retention of barium and secretions.  Pt is having some aspiration of secretions that may mix with barium- chronic in nature, but also occuring after the swallow.  Did not test chin tuck due to pyriform sinus retention that would spill into open larynx with posture - result in aspiration.  Given pt has not consumed po for approx one month and he has sensory impairment - would recommend to return to po conservatively.  Recommend initiate a clear or full liquid diet. SLP will follow up to provide pt with trismus device and for dietary tolerance, advancement. Pt's tracheostomy tube was capped during testing.   Swallow Evaluation Recommendations       SLP Diet Recommendations:  (clears vs fulls)   Liquid Administration via: Cup;Straw   Medication Administration: Crushed with puree       Compensations: Slow rate;Small sips/bites (effortful, doublt swallows, expectorate after a few boluses)   Postural Changes: Seated upright at 90 degrees;Remain semi-upright after after feeds/meals (Comment)   Oral Care Recommendations: Oral care QID     Kathleen Lime, MS [facility] SLP Acute Rehab Services Office [phone]    [name] 08/24/2019,2:20 PM

## 2019-08-24 NOTE — Progress Notes (Addendum)
NAME:  Devon Peterson, MRN:  [id number], DOB:  [birth year], LOS: 74 ADMISSION DATE:  07/07/2019, CONSULTATION DATE:  07/09/19  REFERRING MD:  Alfredia Ferguson, CHIEF COMPLAINT:  Post-operative hypotension   Brief History   64 yo male smoker admitted 5/04 with aspiration pneumonia and found to have hypopharyngeal mass.  Had direct laryngoscopy with biopsy and tracheostomy on 5/06.  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      Past Medical History  COPD, Cocaine abuse  Significant Hospital Events   5/04 admitted 5/05 seen by ENT as initial evaluation underwent flexible laryngoscopy 5/06 OR for direct laryngoscopy + biopsy, and tracheostomy placement, postop for hypotension 5/11 G tube placed 5/23 tracheostomy bleeding >> no interventions needs 5/24 transfer to ICU for respiratory distress; placed on ventilator and changed to #4 cuffed trach (unable to pass #6 cuffed trach); ENT changed to #6 cuffed trach 5/27 episode of a flutter >  Amiodarone boluses only  5/31 Off from mechanical ventilation for 48 hours 6/01 Getting up with physical therapy 6/15 PCCM called back for FOB of LLL 6/17 bronchoscopy 6/18 start trach capping trials, reconsult speech therapy 6/21 Pt has been capping during day, uncapping at night (for unclear reasons)  Consults:  ENT Oncology Radiation oncology Palliative care  Procedures:  Tracheostomy 5/06 >> G tube 5/11 >>   Significant Diagnostic Tests:  CT angio chest 5/04 >> atherosclerosis, severe emphysema, b/l lower lobe consolidation Lt > Rt, 2.7 cm mass LLL CT neck 5/05 >> enhancing hypopharyngeal soft tissue in post cricoid region  Micro Data:  SARS CoV2 PCR 5/04 >> negative Blood 5/04 >> negative Sputum 5/24 >> no org seen > normal flora BAL 6/17 >>  Antimicrobials:  Unasyn 5/04 >> 5/11   Interim history/subjective:  Trach cap in place, good voice quality  Pt reports they have been removing the cap overnight  Afebrile  Pt ordering diet     Objective   Blood pressure 101/77, pulse 78, temperature 98.1 F (36.7 C), temperature source Oral, resp. rate 14, height _0  (1.803 m), weight 43 kg, SpO2 100 %.    FiO2 (%):  [21 %-28 %] 28 %   Intake/Output Summary (Last 24 hours) at 08/24/2019 1544 Last data filed at 08/24/2019 1149 Gross per 24 hour  Intake 237 ml  Output 1850 ml  Net -1613 ml   Filed Weights   07/20/19 1600 08/10/19 1225 08/22/19 0415  Weight: 44 kg 42.2 kg 43 kg    Examination: General: cachectic adult male lying in bed in NAD HEENT: MM pink/moist, trach midline c/d/i, #6 trach Neuro: AAOx4, speech clear, MAE CV: s1s2 RRR, no m/r/g PULM:  Non-labored on RA, lungs bilaterally clear GI: soft, bsx4 active  Extremities: warm/dry, no edema  Skin: no rashes or lesions  Resolved problems:  Aspiration pneumonia Acute hypoxic respiratory failure Transient A fib/flutter new this admission. Hypotension from hypovolemia.  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 Initially required tracheostomy due to upper airway obstruction. S/P XRT.  Tolerated day time capping trials over the weekend of 6/19, 6/20.  -leave trach capped unless he has new desaturation or work of breathing  -leave capped 24/7 -hold trach change on 6/22, may be nearing a point of decannulation  -appreciate SLP efforts with patient > cleared for liquid diet -f/u in am for evaluation post overnight capping trial   Lt lower lung mass. -LLL biopsy with benign bronchial wall and lung parenchyma, no evidence of malignancy  on submitted tissue  COPD with Emphysema. -PRN xopenex   Dysphagia. -appreciate SLP  -continue TF until patient able to ingest enough caloric intake  Best practice:  Diet: bolus tube feeds, liquid oral diet  DVT prophylaxis: PAS GI prophylaxis: protonix Mobility: as tolerated Code Status: full code  Disposition: per primary   Labs:   CMP Latest Ref Rng & Units 08/23/2019 08/22/2019  08/21/2019  Glucose 70 - 99 mg/dL 91 133(H) 75  BUN 8 - 23 mg/dL _0 Creatinine 0.61 - 1.24 mg/dL 0.43(L) 0.46(L) 0.37(L)  Sodium 135 - 145 mmol/L 138 134(L) 134(L)  Potassium 3.5 - 5.1 mmol/L 4.3 4.0 3.7  Chloride 98 - 111 mmol/L 97(L) 96(L) 97(L)  CO2 22 - 32 mmol/L _1 Calcium 8.9 - 10.3 mg/dL 9.3 8.8(L) 8.8(L)  Total Protein 6.5 - 8.1 g/dL 6.6 - 6.8  Total Bilirubin 0.3 - 1.2 mg/dL 0.3 - 0.5  Alkaline Phos 38 - 126 U/L 60 - 62  AST 15 - 41 U/L 17 - 16  ALT 0 - 44 U/L 13 - 12    CBC Latest Ref Rng & Units 08/23/2019 08/22/2019 08/21/2019  WBC 4.0 - 10.5 K/uL 3.1(L) 3.5(L) 3.5(L)  Hemoglobin 13.0 - 17.0 g/dL 9.3(L) 9.9(L) 10.1(L)  Hematocrit 39 - 52 % 29.1(L) 30.7(L) 31.4(L)  Platelets 150 - 400 K/uL 212 248 257    ABG    Component Value Date/Time   PHART 7.299 (L) 07/27/2019 1655   PCO2ART 60.2 (H) 07/27/2019 1655   PO2ART 32.8 (LL) 07/27/2019 1655   HCO3 28.6 (H) 07/27/2019 1655   O2SAT 44.3 07/27/2019 1655     CBG (last 3)  Recent Labs    08/24/19 0052 08/24/19 0843 08/24/19 0929  GLUCAP 86 65* 108*     Signature:   [name], MSN, NP-C [facility] 08/24/2019, 3:44 PM   Please see [email or web address] for pager details.

## 2019-08-24 NOTE — Progress Notes (Addendum)
HEMATOLOGY-ONCOLOGY PROGRESS NOTE  SUBJECTIVE: No new complaints reported this morning.  PHYSICAL EXAMINATION:  Vitals:   08/24/19 0611 08/24/19 0818  BP: 106/74   Pulse: 77 71  Resp: 15 12  Temp: 98.7 F (37.1 C)   SpO2: 97% 96%   Filed Weights   07/20/19 1600 08/10/19 1225 08/22/19 0415  Weight: 44 kg 42.2 kg 43 kg    Intake/Output from previous day: 06/20 0701 - 06/21 0700 In: 237 [NG/GT:237] Out: 1500 [Urine:1500]  General:  Severe cachexia, temporal muscle wasting noted.      Neck: tracheostomy midline , oropharynx without thrush or ulcers Respiratory: Diminished breath sounds.   Cardiovascular: Regular rate and rhythm, no lower extremity edema GI:  abdomen was soft, flat, nontender, nondistended, without organomegaly.    PEG tube without erythema or drainage.   Neuro:  Awake and alert.    LABORATORY DATA:  I have reviewed the data as listed CMP Latest Ref Rng & Units 08/23/2019 08/22/2019 08/21/2019  Glucose 70 - 99 mg/dL 91 133(H) 75  BUN 8 - 23 mg/dL 20 19 15   Creatinine 0.61 - 1.24 mg/dL 0.43(L) 0.46(L) 0.37(L)  Sodium 135 - 145 mmol/L 138 134(L) 134(L)  Potassium 3.5 - 5.1 mmol/L 4.3 4.0 3.7  Chloride 98 - 111 mmol/L 97(L) 96(L) 97(L)  CO2 22 - 32 mmol/L 31 29 29   Calcium 8.9 - 10.3 mg/dL 9.3 8.8(L) 8.8(L)  Total Protein 6.5 - 8.1 g/dL 6.6 - 6.8  Total Bilirubin 0.3 - 1.2 mg/dL 0.3 - 0.5  Alkaline Phos 38 - 126 U/L 60 - 62  AST 15 - 41 U/L 17 - 16  ALT 0 - 44 U/L 13 - 12    Lab Results  Component Value Date   WBC 3.1 (L) 08/23/2019   HGB 9.3 (L) 08/23/2019   HCT 29.1 (L) 08/23/2019   MCV 86.4 08/23/2019   PLT 212 08/23/2019   NEUTROABS 3.0 08/12/2019    CT CHEST W CONTRAST  Result Date: 08/17/2019 CLINICAL DATA:  Laryngeal cancer, lung mass. EXAM: CT CHEST WITH CONTRAST TECHNIQUE: Multidetector CT imaging of the chest was performed during intravenous contrast administration. CONTRAST:  61mL OMNIPAQUE IOHEXOL 300 MG/ML  SOLN COMPARISON:  07/07/2019.  FINDINGS: Cardiovascular: Coronary artery calcification. Heart size normal. No pericardial effusion. Mediastinum/Nodes: Hypopharyngeal mass is incidentally imaged. No pathologically enlarged mediastinal, hilar or axillary lymph nodes. Esophagus is grossly unremarkable. Lungs/Pleura: Centrilobular emphysema. Mild scarring in the apices. Persistent 2.1 x 2.8 cm low-attenuation lesion in the superior segment left lower lobe, stable in size from 07/07/2019. Surrounding collapse/consolidation in the left lower lobe. Basilar predominant peribronchovascular nodularity, ground-glass and consolidation seen on 07/07/2019 have otherwise largely resolved. No pleural fluid. Tracheostomy is in place. Airway is otherwise unremarkable. Upper Abdomen: Visualized portions of the liver, adrenal glands, kidneys, spleen, pancreas, stomach and bowel are grossly unremarkable. Percutaneous gastrostomy tube is partially imaged. Musculoskeletal: No worrisome lytic or sclerotic lesions. IMPRESSION: 1. Low-attenuation lesion in the superior segment left lower lobe, unchanged from 07/07/2019 and worrisome for malignancy. Residual surrounding collapse/consolidation in the left lower lobe. 2. Basilar predominant peribronchovascular nodularity, ground-glass and consolidation seen on 07/07/2019 have otherwise largely resolved. 3. Hypopharyngeal mass, consistent with the given history of laryngeal cancer. 4. Coronary artery calcification. 5.  Emphysema (ICD10-J43.9). Electronically Signed   By: Lorin Picket M.D.   On: 08/17/2019 11:57   DG Chest Port 1 View  Result Date: 08/20/2019 CLINICAL DATA:  Status post bronchoscopy. EXAM: PORTABLE CHEST 1 VIEW COMPARISON:  Jul 31, 2019. FINDINGS: The heart size and mediastinal contours are within normal limits. Tracheostomy tube is in grossly good position. No pneumothorax or pleural effusion is noted. Both lungs are clear. The visualized skeletal structures are unremarkable. IMPRESSION: No active  disease. No pneumothorax is noted. Electronically Signed   By: Marijo Conception M.D.   On: 08/20/2019 13:01   DG Chest Port 1 View  Result Date: 07/31/2019 CLINICAL DATA:  Respiratory failure EXAM: PORTABLE CHEST 1 VIEW COMPARISON:  07/29/2019 FINDINGS: No significant change in AP portable examination. Tracheostomy. There is probable left basilar atelectasis or consolidation and associated layering pleural effusion. No new airspace opacity. The heart and mediastinum are unremarkable. IMPRESSION: 1. No significant change in AP portable examination. There is probable left basilar atelectasis or consolidation and associated layering pleural effusion. No new airspace opacity. 2.  Tracheostomy. Electronically Signed   By: Eddie Candle M.D.   On: 07/31/2019 08:16   DG Chest Port 1 View  Result Date: 07/29/2019 CLINICAL DATA:  Respiratory failure. EXAM: PORTABLE CHEST 1 VIEW COMPARISON:  07/28/2019 FINDINGS: The tracheostomy tube is stable. The cardiac silhouette, mediastinal and hilar contours are within normal limits and unchanged. Stable hyperinflation and emphysematous changes. Persistent patchy bibasilar infiltrates, left greater than right. IMPRESSION: Persistent bibasilar infiltrates, left greater than right. Electronically Signed   By: Marijo Sanes M.D.   On: 07/29/2019 09:12   Portable Chest xray  Result Date: 07/28/2019 CLINICAL DATA:  Respiratory insufficiency EXAM: PORTABLE CHEST 1 VIEW COMPARISON:  Yesterday FINDINGS: Extensive airspace disease with volume loss on the left. There was airspace opacity and airway obstruction by recent chest CT. Milder infiltrate at the right base. The lungs are hyperinflated. Normal heart size. Tracheostomy tube in place. IMPRESSION: Unchanged pneumonia and volume loss on the left. Electronically Signed   By: Monte Fantasia M.D.   On: 07/28/2019 07:53   DG CHEST PORT 1 VIEW  Result Date: 07/27/2019 CLINICAL DATA:  Tracheostomy, respiratory failure EXAM: PORTABLE  CHEST 1 VIEW COMPARISON:  07/26/2019 FINDINGS: Tracheostomy in satisfactory position. Patient is rotated. Mild left perihilar/infrahilar soft tissue prominence, favoring pneumonia. Small left pleural effusion. Right lung is clear, noting minimal right basilar atelectasis versus pneumonia. No pneumothorax. The heart is normal in size. IMPRESSION: Suspected left upper and lower lobe pneumonia. Small left pleural effusion. Mild right basilar atelectasis versus pneumonia. Electronically Signed   By: Julian Hy M.D.   On: 07/27/2019 13:56   DG CHEST PORT 1 VIEW  Result Date: 07/26/2019 CLINICAL DATA:  Shortness of breath, tracheostomy EXAM: PORTABLE CHEST 1 VIEW COMPARISON:  07/25/2019 FINDINGS: The heart size and mediastinal contours are within normal limits. Tracheostomy. Possible small layering pleural effusions. The visualized skeletal structures are unremarkable. IMPRESSION: Possible small layering pleural effusions. No acute appearing airspace opacity. Electronically Signed   By: Eddie Candle M.D.   On: 07/26/2019 15:17   DG C-ARM BRONCHOSCOPY  Result Date: 08/20/2019 C-ARM BRONCHOSCOPY: Fluoroscopy was utilized by the requesting physician.  No radiographic interpretation.    ASSESSMENT AND PLAN: 1.  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  Left  lower lobe of the lung mass concerning for primary neoplasm versus metastatic disease             -07/07/2019 CT angiogram of the chest 2.7 cm mass in the left lower lobe of the lung             -CT 08/17/2019-persistent low-attenuation lesion in the superior segment of left lower lobe with surrounding consolidation/collapse, stable from   07/07/2019, consolidation and groundglass opacity in the left lower lobe have largely resolved  -08/20/2019 bronchoscopy-biopsy nondiagnostic 3.  Cachexia 4.  Aspiration pneumonia 5.  Mild anemia 6.  Atrial fibrillation with RVR 6.  COPD 8.  Alcohol abuse 9.  Tobacco dependence 10.  Cocaine abuse 11.  Odynophagia secondary to radiation  Devon Peterson appears unchanged.  He completed radiation to the neck mass on 08/21/2019.  He is awaiting SNF placement.  He underwent bronchoscopy on 08/20/2019.  Biopsy results were nondiagnostic.  Recommendations: 1.  Await placement at SNF. 2.  We will arrange for outpatient follow-up the cancer center and he will have a restaging CT scan of the neck as an outpatient.   LOS: 48 days   Mikey Bussing, DNP, AGPCNP-BC, AOCNP 08/24/19  I discussed the biopsy findings with Devon Peterson.  We will continue to follow the left lower lobe process on repeat imaging.  He has completed radiation to the neck mass.  Swallowing function has improved and the tracheostomy is now capped.  Outpatient follow-up will be scheduled at the Cancer center.

## 2019-08-25 LAB — CBC
HCT: 31.4 % — ABNORMAL LOW (ref 39.0–52.0)
Hemoglobin: 10.2 g/dL — ABNORMAL LOW (ref 13.0–17.0)
MCH: 27.9 pg (ref 26.0–34.0)
MCHC: 32.5 g/dL (ref 30.0–36.0)
MCV: 85.8 fL (ref 80.0–100.0)
Platelets: 249 10*3/uL (ref 150–400)
RBC: 3.66 MIL/uL — ABNORMAL LOW (ref 4.22–5.81)
RDW: 14.6 % (ref 11.5–15.5)
WBC: 3.5 10*3/uL — ABNORMAL LOW (ref 4.0–10.5)
nRBC: 0 % (ref 0.0–0.2)

## 2019-08-25 MED ORDER — METOPROLOL TARTRATE 25 MG/10 ML ORAL SUSPENSION: 12.5000 mg | Freq: Two times a day (BID) | ORAL | Status: DC

## 2019-08-25 MED ORDER — METOPROLOL TARTRATE 25 MG/10 ML ORAL SUSPENSION
12.5000 mg | Freq: Two times a day (BID) | ORAL | Status: DC
  Administered 2019-08-25 – 2019-08-28 (×7): 12.5 mg
  Filled 2019-08-25 (×7): qty 5

## 2019-08-25 NOTE — Plan of Care (Signed)
  Problem: Clinical Measurements: Goal: Ability to maintain clinical measurements within normal limits will improve Outcome: Progressing Goal: Will remain free from infection Outcome: Progressing Goal: Diagnostic test results will improve Outcome: Progressing Goal: Respiratory complications will improve Outcome: Progressing Goal: Cardiovascular complication will be avoided Outcome: Progressing   Problem: Coping: Goal: Level of anxiety will decrease Outcome: Progressing   Problem: Elimination: Goal: Will not experience complications related to bowel motility Outcome: Progressing Goal: Will not experience complications related to urinary retention Outcome: Progressing   Problem: Coping: Goal: Level of anxiety will decrease Outcome: Progressing

## 2019-08-25 NOTE — Progress Notes (Signed)
  Speech Language Pathology Treatment: Dysphagia  Patient Details Name: Devon Peterson MRN: [id number] DOB: 12-May-1955 Today's Date: 08/25/2019 Time: 5465-0354 SLP Time Calculation (min) (ACUTE ONLY): 33 min  Assessment / Plan / Recommendation Clinical Impression  Pt today seen to assess po tolerance, educate him to comparison of prior exam one month ago compared to yesterday's.  Using [place] and teach back, pt viewed several flouro loops of each study.  Using teach back, reinforced compensation strategies, importance of maintaining strength of cough/hock for airway protection.   Today pt is reporting discomfort from XRT thus he may be less inclined to follow strategy. Advised he use extra caution with swallow  today given pain.    Observed pt consuming thin liquids and icecream. No clinical indication of aspiration and voice was clear during session.  Intake was minimal however, likely due to odynophagia.  With MD approval, diet was advanced to full liquids.    In addition, SlP provided pt with a single exercise to initiate - lingual press to hard palate to improve laryngeal closure - which is viewed to be diminished during his MBS.  Pt returned demonstration of exercise with mod I and denied discomfort with exercise.  Provided pt with written information re: exercise and and chart to keep track of conducting TID x20 per set.    Will follow up for dysphagia management, tx including establishing use of trismus device. Thanks for allowing this SLP to work with this motivated pt.    HPI HPI: HPI: 64 yo with progressive dysphagia, appears cachetic, found to have likely aspiration pna per CT - enhancing mass LLL- suspected aspiration.  Imaging studies also concerning for soft tissue mass in hypopharynx. Pt has been a consumer of ETOH - beer and smokes cigarettes. CT neck showed "Enhancing hypopharyngeal soft tissue in the post cricoid region extending superiorly with effacement of the pyriform sinuses and  thickening of the aryepiglottic folds."  Pt desired to eat/drink and swallow evaluation had been ordered. SLP advised an MBS to view impact of mass on pharyngeal swallow. This was completed 07/08/10, prior to trach placement, with recommendation for NPO except ice chips after oral care.      SLP Plan  Continue with current plan of care       Recommendations  Diet recommendations: Other(comment);Thin liquid (full liquids) Liquids provided via: Cup;Straw Medication Administration: Via alternative means (or suspension) Supervision: Patient able to self feed Compensations: Slow rate;Small sips/bites;Effortful swallow (cough or clear throat and expectorate on occasion) Postural Changes and/or Swallow Maneuvers: Seated upright 90 degrees;Upright 30-60 min after meal                Oral Care Recommendations: Oral care BID Follow up Recommendations: Skilled Nursing facility SLP Visit Diagnosis: Dysphagia, oropharyngeal phase (R13.12) Plan: Continue with current plan of care       GO                [name] 08/25/2019, 3:14 PM   Kathleen Lime, MS [place] Office [phone]

## 2019-08-25 NOTE — Progress Notes (Signed)
Devon Peterson  [id number] DOB: 1955-12-06 DOA: 07/07/2019 PCP: Patient, No Pcp Per    Brief Narrative:  64 year old with a history of COPD, tobacco abuse, and cocaine abuse who presented Jul 07, 2019 with aspiration pneumonia and was found to have a hypopharyngeal mass.  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  He has suffered a prolonged hospitalization since.    A G-tube was placed Jul 14, 2019.  He suffered tracheostomy bleeding Jul 26, 2019 which fortunately resolved without further intervention.  On May 24 he required transfer to the ICU with acute respiratory distress and was placed on the ventilator via his trach.  On May 27 he suffered an episode of atrial flutter which was successfully treated with amiodarone boluses only.  He was able to be transferred off of the ICU service August 05, 2019  Antimicrobials:  Unasyn 5/4 > 5/11  Subjective: Doing well with tracheostomy capping trials and swallowing evaluations.  No new complaints today.  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  COPD/emphysema Well  compensated at present  Anemia of critical illness and chronic disease with iron deficiency Hemoglobin stable at this time  Paroxysmal/transient atrial flutter Stable at present  Stage II sacral pressure ulcer -present on admission Pressure Injury 07/07/19 Sacrum Mid;Upper Stage 2 -  Partial thickness loss of dermis presenting as a shallow open injury with a red, pink wound bed without slough. 3 each small separate open areas (Active)  07/07/19 2107  Location: Sacrum  Location Orientation: Mid;Upper  Staging: Stage 2 -  Partial thickness loss of dermis presenting as a shallow open injury with a red, pink wound bed without slough.  Wound Description (Comments): 3 each small separate open areas  Present on Admission: Yes   Depression  Severe malnutrition in context of chronic illness, Underweight  DVT prophylaxis: SCDs Code Status: FULL CODE Family Communication:  Status is: Inpatient  Remains inpatient appropriate because:IV treatments appropriate due to intensity of illness or inability to take PO   Dispo: The patient is from: Home              Anticipated d/c is to: SNF              Anticipated d/c date is: ~2 days              Patient currently is not medically stable to d/c.  Awaiting tracheostomy decannulation and will require approximately 24 hours of observation post decannulation to assure that his airway is safe before he is a candidate for discharge.   Consultants:  PCCM ENT Oncology XRT   Objective: Blood pressure 108/75, pulse 75, temperature 98.2 F (36.8 C), temperature source Oral, resp. rate 15, height '5\' 11"'$  (1.803 m), weight 43 kg,  SpO2 100 %.  Intake/Output Summary (Last 24 hours) at 08/25/2019 0839 Last data filed at 08/25/2019 0644 Gross per 24 hour  Intake --  Output 1800 ml  Net -1800 ml   Filed Weights   07/20/19 1600 08/10/19 1225 08/22/19 0415  Weight: 44 kg 42.2 kg 43 kg    Examination: General: No acute respiratory distress -resting  comfortably Lungs: Clear to auscultation throughout without wheezing Cardiovascular: RRR Abdomen: Thin, soft, bowel sounds positive -PEG insertion unremarkable Extremities: Cachectic, no edema bilateral lower extremities  CBC: Recent Labs  Lab 08/22/19 0415 08/23/19 0441 08/25/19 0359  WBC 3.5* 3.1* 3.5*  HGB 9.9* 9.3* 10.2*  HCT 30.7* 29.1* 31.4*  MCV 86.2 86.4 85.8  PLT 248 212 322   Basic Metabolic Panel: Recent Labs  Lab 08/21/19 0330 08/22/19 0415 08/23/19 0441  NA 134* 134* 138  K 3.7 4.0 4.3  CL 97* 96* 97*  CO2 '29 29 31  '$ GLUCOSE 75 133* 91  BUN '15 19 20  '$ CREATININE 0.37* 0.46* 0.43*  CALCIUM 8.8* 8.8* 9.3  MG  --  1.7 1.8   GFR: Estimated Creatinine Clearance: 56.7 mL/min (A) (by C-G formula based on SCr of 0.43 mg/dL (L)).  Liver Function Tests: Recent Labs  Lab 08/21/19 0330 08/23/19 0441  AST 16 17  ALT 12 13  ALKPHOS 62 60  BILITOT 0.5 0.3  PROT 6.8 6.6  ALBUMIN 3.2* 3.2*    CBG: Recent Labs  Lab 08/22/19 0410 08/24/19 0052 08/24/19 0843 08/24/19 0929 08/24/19 1633  GLUCAP 143* 86 65* 108* 131*     Scheduled Meds:  ascorbic acid  100 mg Per Tube BID   chlorhexidine gluconate (MEDLINE KIT)  15 mL Mouth Rinse BID   Chlorhexidine Gluconate Cloth  6 each Topical Daily   docusate  100 mg Per Tube BID   feeding supplement (OSMOLITE 1.5 CAL)  237 mL Per Tube 5 X Daily   feeding supplement (PRO-STAT SUGAR FREE 64)  30 mL Per Tube BID   ferrous sulfate  300 mg Per Tube BID WC   free water  120 mL Per Tube 5 X Daily   mouth rinse  15 mL Mouth Rinse 10 times per day   metoprolol tartrate  25 mg Per Tube BID   pantoprazole sodium  40 mg Per Tube Q24H   polyethylene glycol  17 g Per Tube Daily   senna-docusate  1 tablet Per Tube BID   sertraline  50 mg Per Tube Daily   thiamine  100 mg Per Tube Daily     LOS: 60 days   Cherene Altes, MD [facility] Office  [phone] Pager - Text Page per Shea Evans  If  7PM-7AM, please contact night-coverage per Amion 08/25/2019, 8:39 AM

## 2019-08-26 LAB — BASIC METABOLIC PANEL
Anion gap: 11 (ref 5–15)
BUN: 16 mg/dL (ref 8–23)
CO2: 27 mmol/L (ref 22–32)
Calcium: 9 mg/dL (ref 8.9–10.3)
Chloride: 99 mmol/L (ref 98–111)
Creatinine, Ser: 0.43 mg/dL — ABNORMAL LOW (ref 0.61–1.24)
GFR calc Af Amer: >60 mL/min (ref >60)
GFR calc non Af Amer: >60 mL/min (ref >60)
Glucose, Bld: 122 mg/dL — ABNORMAL HIGH (ref 70–99)
Potassium: 4.1 mmol/L (ref 3.5–5.1)
Sodium: 137 mmol/L (ref 135–145)

## 2019-08-26 LAB — MAGNESIUM: Magnesium: 1.6 mg/dL — ABNORMAL LOW (ref 1.7–2.4)

## 2019-08-26 MED ORDER — MAGNESIUM SULFATE 2 GM/50ML IV SOLN
2.0000 g | Freq: Once | INTRAVENOUS | Status: AC
  Administered 2019-08-26: 2 g via INTRAVENOUS
  Filled 2019-08-26: qty 50

## 2019-08-26 MED ORDER — CHLORHEXIDINE GLUCONATE 0.12 % MT SOLN
OROMUCOSAL | Status: AC
  Administered 2019-08-26: 15 mL via OROMUCOSAL
  Filled 2019-08-26: qty 15

## 2019-08-26 NOTE — Progress Notes (Signed)
Per MD order, RT removed PT trach and covered with gauze and tape. PT was suction prior to trach removal which resulted in small, white, fairly thin, frothy secretions. PT stated he was breathing fine before and after trach removal. Sp02 pre trach removal 96%, HR 84. Post trach removal Sp02 98% and HR 85. No respiratory distress noted at this time. RN aware of trach removal.

## 2019-08-26 NOTE — Progress Notes (Signed)
  Speech Language Pathology Treatment: Dysphagia  Patient Details Name: Devon Peterson MRN: [id number] DOB: 1955/05/20 Today's Date: 08/26/2019 Time: 8638-1771 SLP Time Calculation (min) (ACUTE ONLY): 12 min  Assessment / Plan / Recommendation Clinical Impression  Session focused on reinforcing hypolaryngeal exercises and trismus exercise.  Pt able to verbalize and demonstrates lingual press but admits he did nor perform yesterday.  In addition, trismus device provided with inital ROM being too limited - modified to 48 mm with improved ROM without pain.  Pt demonstrates trismus device use x3 with mod I cues.  He did not mute or turn down loud tv during session after SLP requested and thus uncertain to full ability to perform exercises reviewed.  Provided information in writing and chart to document exercises.  Did not observe pt with po today but his cough remains strong with adequate expectoration.  Note pt for possible decannulation today - thus advise continue full liquids at this time.      HPI HPI: HPI: 64 yo with progressive dysphagia, appears cachetic, found to have likely aspiration pna per CT - enhancing mass LLL- suspected aspiration.  Imaging studies also concerning for soft tissue mass in hypopharynx. Pt has been a consumer of ETOH - beer and smokes cigarettes. CT neck showed "Enhancing hypopharyngeal soft tissue in the post cricoid region extending superiorly with effacement of the pyriform sinuses and thickening of the aryepiglottic folds."  Pt desired to eat/drink and swallow evaluation had been ordered. SLP advised an MBS to view impact of mass on pharyngeal swallow. This was completed 07/08/10, prior to trach placement, with recommendation for NPO except ice chips after oral care.      SLP Plan  Continue with current plan of care       Recommendations  Diet recommendations: Thin liquid;Other(comment) Liquids provided via: Cup;Straw Medication Administration: Via alternative  means Supervision: Patient able to self feed;Intermittent supervision to cue for compensatory strategies Compensations: Slow rate;Small sips/bites (clear throat, expectorate prn) Postural Changes and/or Swallow Maneuvers: Seated upright 90 degrees;Upright 30-60 min after meal                Plan: Continue with current plan of care       GO               Devon Lime, MS [place]  Devon Peterson 08/26/2019, 1:18 PM

## 2019-08-26 NOTE — Progress Notes (Signed)
PT denies need for suctioning at this time.

## 2019-08-26 NOTE — Progress Notes (Signed)
PROGRESS NOTE  Devon Peterson  [id number] DOB: 03/16/1955 DOA: 07/07/2019 PCP: Patient, No Pcp Per   Brief Narrative: 64 year old male with history of COPD, cocaine abuse, tobacco abuse presented on Jul 07, 2019 with aspiration pneumonia and was found to have hypopharyngeal mass.  He had direct laryngoscopy with biopsy and tracheostomy on Jul 09, 2019.  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  He has had a prolonged hospitalization.  He had a G-tube placed on Jul 14, 2019.  He had some tracheostomy bleeding on Jul 26, 2019 which resolved without any intervention. On Jul 27, 2019, he was transferred to ICU for respiratory distress; placed on ventilator.  On Jul 30, 2019, he had episode of atrial flutter which responded to amiodarone boluses only.  Subsequently, he has been off of mechanical ventilation.  He was transferred back to [facility] service on 08/05/19  Assessment & Plan: Principal Problem:   Aspiration pneumonia ([place]) Active Problems:   Sepsis ([place])   Dysphagia   Severe protein-calorie malnutrition ([place])   Alcohol use   Pressure injury of skin   Lung mass   Neck mass   Tracheostomy care ([place])   Squamous cell carcinoma of neck   Palliative care encounter   Acute respiratory insufficiency  Acute hypoxic respiratory failure: Aspiration pneumonia: Treated with antibiotics - Changed to #6 cuffed trach by ENT on Jul 27, 2019.  Patient tolerated trach collar since a.m. of Jul 31, 2019 and vent was discontinued on Aug 02, 2019. ATC 6/13. Trach capped 6/21, planning decannulation per pulmonology. Will monitor x24 hours post decannulation prior to DC. - Continue diet advancement per SLP. Making progress s/p XRT with SLP.   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 Will continue management per oncology.   Left lower lung mass: Nondiagnostic biopsy  - Currently undergoing radiation therapy with plans for radiation treatments till 08/21/2019.  Oncology  following, CT chest ordered, appreciate any new recommendations.  Dysphagia Cachexia Severe protein calorie malnutrition Generalized conditioning - Continue tube feeds via G-tube (placed 07/14/2019) until durably tolerating a diet.   - Overall prognosis is guarded to poor.  Patient remains full code.  Palliative care has evaluated patient as well during the hospitalization. No evidence of current infection.  Hypomagnesemia: Supplemented with improvement.   COPD with emphysema - No exacerbation, continue prns  Anemia of critical illness, iron deficiency, chronic disease - Hemoglobin stable.  Monitor intermittently.  Paroxysmal/transient A. Fib/flutter - Had required amiodarone boluses briefly.  Currently rate controlled.  Continue metoprolol  Stage II sacral pressure ulcer - Present on admission - Continue wound care  Depression - Continue Zoloft  Stage II pressure injuries to mid sacrum: Present on admission - 3 separate areas.  Continue wound care Pressure Injury 07/07/19 Sacrum Mid;Upper Stage 2 -  Partial thickness loss of dermis presenting as a shallow open injury with a red, pink wound bed without slough. 3 each small separate open areas (Active)  07/07/19 2107  Location: Sacrum  Location Orientation: Mid;Upper  Staging: Stage 2 -  Partial thickness loss of dermis presenting as a shallow open injury with a red, pink wound bed without slough.  Wound Description (Comments): 3 each small separate open areas  Present on Admission: Yes   Severe protein calorie malnutrition: BMI 13.  - Supplement protein as able.  DVT prophylaxis: SCDs Code Status: Full Family Communication: None at bedside Disposition Plan:  Status is: Inpatient  Remains inpatient appropriate because:Unsafe d/c plan  Dispo:  The patient is from: Home              Anticipated d/c is to: SNF              Anticipated d/c date is: 08/28/2019.              Patient currently is not medically stable to  d/c.  Consultants:   PCCM, ENT, oncology, palliative care, radiation oncology.   Procedures:  Intubation Tracheostomy on 07/09/2019, decannulation 6/23. G-tube on 07/14/2019  Antimicrobials: Unasyn from 07/07/2019-07/14/2019  Subjective: Has had trach capped for >48 hours. Suctioning oral secretions, speaking and taking a diet. Much improved SLP evaluation recently. No fever, chills, bleeding.   Objective: Vitals:   08/26/19 0305 08/26/19 0505 08/26/19 1100 08/26/19 1218  BP:  101/76  98/71  Pulse: 81 73 77 77  Resp:  17 17   Temp:  98.4 F (36.9 C)  98.9 F (37.2 C)  TempSrc:  Oral  Oral  SpO2: 99% 100% 98% 99%  Weight:      Height:        Intake/Output Summary (Last 24 hours) at 08/26/2019 1838 Last data filed at 08/26/2019 1220 Gross per 24 hour  Intake 120 ml  Output 1650 ml  Net -1530 ml   Filed Weights   07/20/19 1600 08/10/19 1225 08/22/19 0415  Weight: 44 kg 42.2 kg 43 kg   Gen: Cachectic male in no distress Pulm: Nonlabored breathing room air, trach capped. Clear. CV: Regular rate and rhythm. No murmur, rub, or gallop. No JVD, no dependent edema. GI: Abdomen soft, non-tender, non-distended, with normoactive bowel sounds.  Ext: Warm, no deformities Skin: No rashes, lesions or ulcers on visualized skin. G-tube site c/d/i. Neuro: Alert and oriented. No focal neurological deficits. Psych: Judgement and insight appear fair. Mood euthymic & affect congruent. Behavior is appropriate.    Data Reviewed: I have personally reviewed following labs and imaging studies  CBC: Recent Labs  Lab 08/21/19 0330 08/22/19 0415 08/23/19 0441 08/25/19 0359  WBC 3.5* 3.5* 3.1* 3.5*  HGB 10.1* 9.9* 9.3* 10.2*  HCT 31.4* 30.7* 29.1* 31.4*  MCV 85.8 86.2 86.4 85.8  PLT 257 248 212 416   Basic Metabolic Panel: Recent Labs  Lab 08/21/19 0330 08/22/19 0415 08/23/19 0441 08/26/19 1019  NA 134* 134* 138 137  K 3.7 4.0 4.3 4.1  CL 97* 96* 97* 99  CO2 _0 GLUCOSE  75 133* 91 122*  BUN _1 CREATININE 0.37* 0.46* 0.43* 0.43*  CALCIUM 8.8* 8.8* 9.3 9.0  MG  --  1.7 1.8 1.6*   GFR: Estimated Creatinine Clearance: 56.7 mL/min (A) (by C-G formula based on SCr of 0.43 mg/dL (L)). Liver Function Tests: Recent Labs  Lab 08/21/19 0330 08/23/19 0441  AST 16 17  ALT 12 13  ALKPHOS 62 60  BILITOT 0.5 0.3  PROT 6.8 6.6  ALBUMIN 3.2* 3.2*   No results for input(s): LIPASE, AMYLASE in the last 168 hours. No results for input(s): AMMONIA in the last 168 hours. Coagulation Profile: No results for input(s): INR, PROTIME in the last 168 hours. Cardiac Enzymes: No results for input(s): CKTOTAL, CKMB, CKMBINDEX, TROPONINI in the last 168 hours. BNP (last 3 results) No results for input(s): PROBNP in the last 8760 hours. HbA1C: No results for input(s): HGBA1C in the last 72 hours. CBG: Recent Labs  Lab 08/22/19 0410 08/24/19 0052 08/24/19 0843 08/24/19 0929 08/24/19 1633  GLUCAP 143* 86 65*  108* 131*   Lipid Profile: No results for input(s): CHOL, HDL, LDLCALC, TRIG, CHOLHDL, LDLDIRECT in the last 72 hours. Thyroid Function Tests: No results for input(s): TSH, T4TOTAL, FREET4, T3FREE, THYROIDAB in the last 72 hours. Anemia Panel: No results for input(s): VITAMINB12, FOLATE, FERRITIN, TIBC, IRON, RETICCTPCT in the last 72 hours. Urine analysis:    Component Value Date/Time   COLORURINE YELLOW 07/07/2019 1729   APPEARANCEUR HAZY (A) 07/07/2019 1729   LABSPEC 1.016 07/07/2019 1729   PHURINE 5.0 07/07/2019 1729   GLUCOSEU NEGATIVE 07/07/2019 1729   HGBUR NEGATIVE 07/07/2019 1729   BILIRUBINUR NEGATIVE 07/07/2019 1729   KETONESUR NEGATIVE 07/07/2019 1729   PROTEINUR 30 (A) 07/07/2019 1729   UROBILINOGEN 1.0 10/28/2008 0458   NITRITE NEGATIVE 07/07/2019 1729   LEUKOCYTESUR NEGATIVE 07/07/2019 1729   Recent Results (from the past 240 hour(s))  Culture, respiratory     Status: None   Collection Time: 08/20/19 10:50 AM   Specimen:  Bronchoalveolar Lavage; Respiratory  Result Value Ref Range Status   Specimen Description   Final    BRONCHIAL ALVEOLAR LAVAGE Performed at [facility] [address] [place], [place] [address]    Special Requests   Final    NONE Performed at [facility], [place] [address], [place], [place] [address]    Gram Stain   Final    FEW WBC PRESENT, PREDOMINANTLY PMN RARE GRAM POSITIVE COCCI RARE GRAM POSITIVE RODS    Culture   Final    Consistent with normal respiratory flora. Performed at [facility], [place] [address], [place], [place] [address]    Report Status 08/22/2019 FINAL  Final  Acid Fast Smear (AFB)     Status: None   Collection Time: 08/20/19 10:50 AM   Specimen: Bronchial Alveolar Lavage  Result Value Ref Range Status   AFB Specimen Processing Concentration  Final   Acid Fast Smear Negative  Final    Comment: (NOTE) Performed At: [facility] [address] [place], [place] [id number] Rush Farmer MD DC:[id number]    Source (AFB) BRONCHIAL ALVEOLAR LAVAGE  Final    Comment: Performed at [facility], [place] [address] [place], [place] [address]  Fungus Culture With Stain     Status: None (Preliminary result)   Collection Time: 08/20/19 10:50 AM   Specimen: Bronchial Alveolar Lavage  Result Value Ref Range Status   Fungus Stain Final report  Final    Comment: (NOTE) Performed At: [facility] [place], [place] [id number] Rush Farmer MD FB:[id number]    Fungus (Mycology) Culture PENDING  Incomplete   Fungal Source BRONCHIAL ALVEOLAR LAVAGE  Final    Comment: Performed at [facility], [place] [address] [place], [place] [address]  Fungus Culture Result     Status: None   Collection Time: 08/20/19 10:50 AM  Result Value Ref Range Status   Result 1 Comment  Final    Comment: (NOTE) KOH/Calcofluor preparation:  no fungus observed. Performed At: [facility], [place] [id number] Rush Farmer MD QD:[id number]       Radiology Studies: No results found.  Scheduled Meds: . ascorbic acid  100 mg Per Tube BID  . chlorhexidine gluconate (MEDLINE KIT)  15 mL Mouth Rinse BID  . Chlorhexidine Gluconate Cloth  6 each Topical Daily  . docusate  100 mg Per Tube BID  . feeding supplement (OSMOLITE 1.5 CAL)  237 mL Per Tube 5 X  Daily  . feeding supplement (PRO-STAT SUGAR FREE 64)  30 mL Per Tube BID  . ferrous sulfate  300 mg Per Tube BID WC  . free water  120 mL Per Tube 5 X Daily  . mouth rinse  15 mL Mouth Rinse 10 times per day  . metoprolol tartrate  12.5 mg Per Tube BID  . pantoprazole sodium  40 mg Per Tube Q24H  . polyethylene glycol  17 g Per Tube Daily  . senna-docusate  1 tablet Per Tube BID  . sertraline  50 mg Per Tube Daily  . thiamine  100 mg Per Tube Daily   Continuous Infusions:   LOS: 50 days   Time spent: 25 minutes.  Patrecia Pour, MD [facility] [email or web address] 08/26/2019, 6:38 PM

## 2019-08-26 NOTE — Progress Notes (Signed)
NAME:  Devon Peterson, MRN:  [id number], DOB:  01-25-1956, LOS: 58 ADMISSION DATE:  07/07/2019, CONSULTATION DATE:  07/09/19  REFERRING MD:  Alfredia Ferguson, CHIEF COMPLAINT:  Post-operative hypotension   Brief History   64 yo male smoker admitted 5/04 with aspiration pneumonia and found to have hypopharyngeal mass.  Had direct laryngoscopy with biopsy and tracheostomy on 5/06.  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      Past Medical History  COPD, Cocaine abuse  Significant Hospital Events   5/04 admitted 5/05 seen by ENT as initial evaluation underwent flexible laryngoscopy 5/06 OR for direct laryngoscopy + biopsy, and tracheostomy placement, postop for hypotension 5/11 G tube placed 5/23 tracheostomy bleeding >> no interventions needs 5/24 transfer to ICU for respiratory distress; placed on ventilator and changed to #4 cuffed trach (unable to pass #6 cuffed trach); ENT changed to #6 cuffed trach 5/27 episode of a flutter >  Amiodarone boluses only  5/31 Off from mechanical ventilation for 48 hours 6/01 Getting up with physical therapy 6/15 PCCM called back for FOB of LLL 6/17 bronchoscopy 6/18 start trach capping trials, reconsult speech therapy 6/21 Pt has been capping during day, uncapping at night (for unclear reasons)   Consults:  ENT Oncology Radiation oncology Palliative care  Procedures:  Tracheostomy 5/06 >> G tube 5/11 >>   Significant Diagnostic Tests:  CT angio chest 5/04 >> atherosclerosis, severe emphysema, b/l lower lobe consolidation Lt > Rt, 2.7 cm mass LLL CT neck 5/05 >> enhancing hypopharyngeal soft tissue in post cricoid region  Micro Data:  SARS CoV2 PCR 5/04 >> negative Blood 5/04 >> negative Sputum 5/24 >> no org seen > normal flora BAL 6/17 >>  Antimicrobials:  Unasyn 5/04 >> 5/11   Interim history/subjective:   Trach cap in place 24/7 since Monday. No issues with dyspnea.  Has oral secretions but no tracheal secretions. Able to  eat and talk  Objective   Blood pressure 101/76, pulse 73, temperature 98.4 F (36.9 C), temperature source Oral, resp. rate 17, height _0  (1.803 m), weight 43 kg, SpO2 100 %.    FiO2 (%):  [21 %] 21 %   Intake/Output Summary (Last 24 hours) at 08/26/2019 1053 Last data filed at 08/26/2019 0600 Gross per 24 hour  Intake 1191 ml  Output 1400 ml  Net -209 ml   Filed Weights   07/20/19 1600 08/10/19 1225 08/22/19 0415  Weight: 44 kg 42.2 kg 43 kg    Examination: Gen:     Catchetic HEENT:  EOMI, sclera anicteric Neck:     No masses; no thyromegaly Lungs:    Clear to auscultation bilaterally; normal respiratory effort CV:         Regular rate and rhythm; no murmurs Abd:      + bowel sounds; soft, non-tender; no palpable masses, no distension Ext:    No edema; adequate peripheral perfusion Skin:      Warm and dry; no rash Neuro: alert and oriented x 3  Resolved problems:  Aspiration pneumonia Acute hypoxic respiratory failure Transient A fib/flutter new this admission. Hypotension from hypovolemia.  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 Initially required tracheostomy due to upper airway obstruction. S/P XRT.  Tolerated day time capping trials over the weekend of 6/19, 6/20.  -Ok for decannulation. Will place order -appreciate SLP efforts with patient > cleared for liquid diet  Lt lower lung mass. -LLL biopsy with benign bronchial wall and lung  parenchyma, no evidence of malignancy on submitted tissue Will need follow up CT in 3 months for revaluation  COPD with Emphysema. -PRN xopenex   Dysphagia. -appreciate SLP  -continue TF until patient able to ingest enough caloric intake  Best practice:  Diet: bolus tube feeds, liquid oral diet  DVT prophylaxis: PAS GI prophylaxis: protonix Mobility: as tolerated Code Status: full code  Disposition: per primary   Labs:   CMP Latest Ref Rng & Units 08/26/2019 08/23/2019 08/22/2019  Glucose  70 - 99 mg/dL 122(H) 91 133(H)  BUN 8 - 23 mg/dL _0 Creatinine 0.61 - 1.24 mg/dL 0.43(L) 0.43(L) 0.46(L)  Sodium 135 - 145 mmol/L 137 138 134(L)  Potassium 3.5 - 5.1 mmol/L 4.1 4.3 4.0  Chloride 98 - 111 mmol/L 99 97(L) 96(L)  CO2 22 - 32 mmol/L _1 Calcium 8.9 - 10.3 mg/dL 9.0 9.3 8.8(L)  Total Protein 6.5 - 8.1 g/dL - 6.6 -  Total Bilirubin 0.3 - 1.2 mg/dL - 0.3 -  Alkaline Phos 38 - 126 U/L - 60 -  AST 15 - 41 U/L - 17 -  ALT 0 - 44 U/L - 13 -    CBC Latest Ref Rng & Units 08/25/2019 08/23/2019 08/22/2019  WBC 4.0 - 10.5 K/uL 3.5(L) 3.1(L) 3.5(L)  Hemoglobin 13.0 - 17.0 g/dL 10.2(L) 9.3(L) 9.9(L)  Hematocrit 39 - 52 % 31.4(L) 29.1(L) 30.7(L)  Platelets 150 - 400 K/uL 249 212 248    ABG    Component Value Date/Time   PHART 7.299 (L) 07/27/2019 1655   PCO2ART 60.2 (H) 07/27/2019 1655   PO2ART 32.8 (LL) 07/27/2019 1655   HCO3 28.6 (H) 07/27/2019 1655   O2SAT 44.3 07/27/2019 1655     CBG (last 3)  Recent Labs    08/24/19 0843 08/24/19 0929 08/24/19 1633  GLUCAP 65* 108* 131*     Signature:   Marshell Garfinkel MD [facility] Please see [email or web address] for pager details.  08/26/2019, 10:56 AM

## 2019-08-26 NOTE — Progress Notes (Signed)
OT Cancellation Note  Patient Details Name: Devon Peterson MRN: [id number] DOB: 05-13-1955   Cancelled Treatment:    Reason Eval/Treat Not Completed: Patient declined, no reason specified (Declined therapy stating "Maybe this afternoon." Will f/u as able)  Lenward Chancellor 08/26/2019, 1:47 PM

## 2019-08-26 NOTE — Progress Notes (Signed)
PT Cancellation Note  Patient Details Name: Devon Peterson MRN: [id number] DOB: 28-Jul-1955   Cancelled Treatment:    Reason Eval/Treat Not Completed: Patient declined, no reason specified. Per Respiratory therapist, pt's trach removed 5-10 minutes ago. Pt declines therapy despite education on importance of strengthening/mobility to improve functional status and independence.   Talbot Grumbling PT, DPT 08/26/19, 2:26 PM

## 2019-08-27 LAB — SARS CORONAVIRUS 2 BY RT PCR (HOSPITAL ORDER, PERFORMED IN ~~LOC~~ HOSPITAL LAB): SARS Coronavirus 2: NEGATIVE

## 2019-08-27 NOTE — Progress Notes (Signed)
PROGRESS NOTE  Devon Peterson  [id number] DOB: [birth year] DOA: 07/07/2019 PCP: Patient, No Pcp Per   Brief Narrative: 64 year old male with history of COPD, cocaine abuse, tobacco abuse presented on Jul 07, 2019 with aspiration pneumonia and was found to have hypopharyngeal mass.  He had direct laryngoscopy with biopsy and tracheostomy on Jul 09, 2019.  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  He has had a prolonged hospitalization.  He had a G-tube placed on Jul 14, 2019.  He had some tracheostomy bleeding on Jul 26, 2019 which resolved without any intervention. On Jul 27, 2019, he was transferred to ICU for respiratory distress; placed on ventilator.  On Jul 30, 2019, he had episode of atrial flutter which responded to amiodarone boluses only.  Subsequently, he has been off of mechanical ventilation.  He was transferred back to [facility] service on 08/05/19. Ultimately completed radiation therapy and had tracheostomy decannulated 6/23. He continues to display significant deconditioning for which SNF placement is being pursued.   Assessment & Plan: Principal Problem:   Aspiration pneumonia ([place]) Active Problems:   Sepsis ([place])   Dysphagia   Severe protein-calorie malnutrition ([place])   Alcohol use   Pressure injury of skin   Lung mass   Neck mass   Tracheostomy care ([place])   Squamous cell carcinoma of neck   Palliative care encounter   Acute respiratory insufficiency  Acute hypoxic respiratory failure: Resolved, decannulated 6/23 and stable on room air. Pulmonology has signed off.  Aspiration pneumonia: Resolved. Treated with antibiotics - Continue diet advancement per SLP. Making progress s/p XRT with SLP.   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 Will continue management per oncology.   Left lower lung mass: Nondiagnostic biopsy  - Further management per oncology  Dysphagia Cachexia Severe protein calorie malnutrition Generalized conditioning -  Continue tube feeds via G-tube (placed 07/14/2019) until durably tolerating a diet.   - Overall prognosis is guarded to poor.  Patient remains full code.  Palliative care has evaluated patient as well during the hospitalization. No evidence of current infection.  Hypomagnesemia: Supplemented with improvement.   COPD with emphysema - No exacerbation, continue prns  Anemia of critical illness, iron deficiency, chronic disease - Hemoglobin stable.  Monitor intermittently.  Paroxysmal/transient A. Fib/flutter - Had required amiodarone boluses briefly.  Currently rate controlled.  Continue metoprolol  Stage II sacral pressure ulcer - Present on admission - Continue wound care  Depression - Continue Zoloft  Stage II pressure injuries to mid sacrum: Present on admission - 3 separate areas.  Continue wound care Pressure Injury 07/07/19 Sacrum Mid;Upper Stage 2 -  Partial thickness loss of dermis presenting as a shallow open injury with a red, pink wound bed without slough. 3 each small separate open areas (Active)  07/07/19 2107  Location: Sacrum  Location Orientation: Mid;Upper  Staging: Stage 2 -  Partial thickness loss of dermis presenting as a shallow open injury with a red, pink wound bed without slough.  Wound Description (Comments): 3 each small separate open areas  Present on Admission: Yes   Severe protein calorie malnutrition: BMI 13.  - Supplement protein as able.  DVT prophylaxis: SCDs Code Status: Full Family Communication: None at bedside Disposition Plan:  Status is: Inpatient  Remains inpatient appropriate because:Unsafe d/c plan  Dispo: The patient is from: Home              Anticipated d/c is to: SNF  Anticipated d/c date is: 08/28/2019.              Patient currently is medically stable to d/c. Didn't work with PT until later this afternoon, delaying TOC's ability to place the patient today.  Consultants:   PCCM, ENT, oncology, palliative  care, radiation oncology.   Procedures:  Intubation Tracheostomy on 07/09/2019, decannulation 6/23. G-tube on 07/14/2019  Antimicrobials: Unasyn from 07/07/2019-07/14/2019  Subjective: Having no dyspnea, coughing, trouble speaking. No new complaints.   Objective: Vitals:   08/27/19 0548 08/27/19 1158 08/27/19 1247 08/27/19 1630  BP: 1_0   Pulse: 73 99 83 82  Resp: _1 Temp: 98.5 F (36.9 C)  98.3 F (36.8 C)   TempSrc:   Oral   SpO2: 95%  100% 99%  Weight:      Height:        Intake/Output Summary (Last 24 hours) at 08/27/2019 1930 Last data filed at 08/27/2019 1400 Gross per 24 hour  Intake 827 ml  Output 1500 ml  Net -673 ml   Filed Weights   07/20/19 1600 08/10/19 1225 08/22/19 0415  Weight: 44 kg 42.2 kg 43 kg   Gen: Cachectic male in no distress Pulm: Nonlabored breathing room air. Clear. CV: Regular rate and rhythm. No murmur, rub, or gallop. No JVD, no dependent edema. GI: Abdomen soft, non-tender, non-distended, with normoactive bowel sounds.  Ext: Warm, no deformities Skin: No new rashes, lesions or ulcers on visualized skin. Trach site covered, c/d/i Neuro: Alert and oriented. No focal neurological deficits. Psych: Judgement and insight appear fair. Mood euthymic & affect congruent. Behavior is appropriate.    Data Reviewed: I have personally reviewed following labs and imaging studies  CBC: Recent Labs  Lab 08/21/19 0330 08/22/19 0415 08/23/19 0441 08/25/19 0359  WBC 3.5* 3.5* 3.1* 3.5*  HGB 10.1* 9.9* 9.3* 10.2*  HCT 31.4* 30.7* 29.1* 31.4*  MCV 85.8 86.2 86.4 85.8  PLT 257 248 212 211   Basic Metabolic Panel: Recent Labs  Lab 08/21/19 0330 08/22/19 0415 08/23/19 0441 08/26/19 1019  NA 134* 134* 138 137  K 3.7 4.0 4.3 4.1  CL 97* 96* 97* 99  CO2 _2 GLUCOSE 75 133* 91 122*  BUN _3 CREATININE 0.37* 0.46* 0.43* 0.43*  CALCIUM 8.8* 8.8* 9.3 9.0  MG  --  1.7 1.8 1.6*   GFR: Estimated Creatinine  Clearance: 56.7 mL/min (A) (by C-G formula based on SCr of 0.43 mg/dL (L)). Liver Function Tests: Recent Labs  Lab 08/21/19 0330 08/23/19 0441  AST 16 17  ALT 12 13  ALKPHOS 62 60  BILITOT 0.5 0.3  PROT 6.8 6.6  ALBUMIN 3.2* 3.2*   No results for input(s): LIPASE, AMYLASE in the last 168 hours. No results for input(s): AMMONIA in the last 168 hours. Coagulation Profile: No results for input(s): INR, PROTIME in the last 168 hours. Cardiac Enzymes: No results for input(s): CKTOTAL, CKMB, CKMBINDEX, TROPONINI in the last 168 hours. BNP (last 3 results) No results for input(s): PROBNP in the last 8760 hours. HbA1C: No results for input(s): HGBA1C in the last 72 hours. CBG: Recent Labs  Lab 08/22/19 0410 08/24/19 0052 08/24/19 0843 08/24/19 0929 08/24/19 1633  GLUCAP 143* 86 65* 108* 131*   Lipid Profile: No results for input(s): CHOL, HDL, LDLCALC, TRIG, CHOLHDL, LDLDIRECT in the last 72 hours. Thyroid Function Tests: No results for input(s): TSH, T4TOTAL, FREET4, T3FREE, THYROIDAB in the last  72 hours. Anemia Panel: No results for input(s): VITAMINB12, FOLATE, FERRITIN, TIBC, IRON, RETICCTPCT in the last 72 hours. Urine analysis:    Component Value Date/Time   COLORURINE YELLOW 07/07/2019 1729   APPEARANCEUR HAZY (A) 07/07/2019 1729   LABSPEC 1.016 07/07/2019 1729   PHURINE 5.0 07/07/2019 1729   GLUCOSEU NEGATIVE 07/07/2019 1729   HGBUR NEGATIVE 07/07/2019 1729   BILIRUBINUR NEGATIVE 07/07/2019 1729   KETONESUR NEGATIVE 07/07/2019 1729   PROTEINUR 30 (A) 07/07/2019 1729   UROBILINOGEN 1.0 10/28/2008 0458   NITRITE NEGATIVE 07/07/2019 1729   LEUKOCYTESUR NEGATIVE 07/07/2019 1729   Recent Results (from the past 240 hour(s))  Culture, respiratory     Status: None   Collection Time: 08/20/19 10:50 AM   Specimen: Bronchoalveolar Lavage; Respiratory  Result Value Ref Range Status   Specimen Description   Final    BRONCHIAL ALVEOLAR LAVAGE Performed at [facility] [address], [place], [place] [address]    Special Requests   Final    NONE Performed at [facility], [place] [address] [place], [place] [address]    Gram Stain   Final    FEW WBC PRESENT, PREDOMINANTLY PMN RARE GRAM POSITIVE COCCI RARE GRAM POSITIVE RODS    Culture   Final    Consistent with normal respiratory flora. Performed at [facility], [place] [address] [place], [place] [address]    Report Status 08/22/2019 FINAL  Final  Acid Fast Smear (AFB)     Status: None   Collection Time: 08/20/19 10:50 AM   Specimen: Bronchial Alveolar Lavage  Result Value Ref Range Status   AFB Specimen Processing Concentration  Final   Acid Fast Smear Negative  Final    Comment: (NOTE) Performed At: [facility] [address] [place], [place] [id number] Rush Farmer MD IO:[id number]    Source (AFB) BRONCHIAL ALVEOLAR LAVAGE  Final    Comment: Performed at [facility], [place] [address], [place], [place] [address]  Fungus Culture With Stain     Status: None (Preliminary result)   Collection Time: 08/20/19 10:50 AM   Specimen: Bronchial Alveolar Lavage  Result Value Ref Range Status   Fungus Stain Final report  Final    Comment: (NOTE) Performed At: [facility] [place], [place] [id number] Rush Farmer MD FY:[id number]    Fungus (Mycology) Culture PENDING  Incomplete   Fungal Source BRONCHIAL ALVEOLAR LAVAGE  Final    Comment: Performed at [facility], [place] [address] [place], [place] [address]  Fungus Culture Result     Status: None   Collection Time: 08/20/19 10:50 AM  Result Value Ref Range Status   Result 1 Comment  Final    Comment: (NOTE) KOH/Calcofluor preparation:  no fungus observed. Performed At: [facility] [address] [place], [place] [id number] Rush Farmer MD ER:[id number]   SARS Coronavirus 2 by RT PCR (hospital order, performed in [facility] hospital lab) Nasopharyngeal Nasopharyngeal Swab     Status: None   Collection Time: 08/27/19 10:35 AM   Specimen: Nasopharyngeal Swab  Result Value Ref Range Status   SARS Coronavirus 2 NEGATIVE NEGATIVE Final    Comment: (NOTE) SARS-CoV-2 target nucleic acids are NOT DETECTED.  The SARS-CoV-2 RNA is generally detectable in upper and lower respiratory specimens during the acute phase of infection. The lowest concentration of SARS-CoV-2 viral copies this assay can detect is 250 copies / mL. A negative result does not preclude  SARS-CoV-2 infection and should not be used as the sole basis for treatment or other patient management decisions.  A negative result may occur with improper specimen collection / handling, submission of specimen other than nasopharyngeal swab, presence of viral mutation(s) within the areas targeted by this assay, and inadequate number of viral copies (<250 copies / mL). A negative result must be combined with clinical observations, patient history, and epidemiological information.  Fact Sheet for Patients:   StrictlyIdeas.no  Fact Sheet for Healthcare Providers: BankingDealers.co.za  This test is not yet approved or  cleared by the [place] FDA and has been authorized for detection and/or diagnosis of SARS-CoV-2 by FDA under an Emergency Use Authorization (EUA).  This EUA will remain in effect (meaning this test can be used) for the duration of the COVID-19 declaration under Section 564(b)(1) of the Act, 21 U.S.C. section 360bbb-3(b)(1), unless the authorization is terminated or revoked sooner.  Performed at [facility], [place] [address] [place], [place] [address]       Radiology Studies: No results found.  Scheduled Meds:  ascorbic acid  100 mg Per Tube BID   chlorhexidine gluconate (MEDLINE KIT)  15 mL Mouth Rinse BID   Chlorhexidine Gluconate Cloth  6 each Topical  Daily   docusate  100 mg Per Tube BID   feeding supplement (OSMOLITE 1.5 CAL)  237 mL Per Tube 5 X Daily   feeding supplement (PRO-STAT SUGAR FREE 64)  30 mL Per Tube BID   ferrous sulfate  300 mg Per Tube BID WC   free water  120 mL Per Tube 5 X Daily   mouth rinse  15 mL Mouth Rinse 10 times per day   metoprolol tartrate  12.5 mg Per Tube BID   pantoprazole sodium  40 mg Per Tube Q24H   polyethylene glycol  17 g Per Tube Daily   senna-docusate  1 tablet Per Tube BID   sertraline  50 mg Per Tube Daily   thiamine  100 mg Per Tube Daily   Continuous Infusions:   LOS: 51 days   Time spent: 15 minutes.  Patrecia Pour, MD [facility] [email or web address] 08/27/2019, 7:30 PM

## 2019-08-27 NOTE — Progress Notes (Signed)
Chedked pt trach site this am.  Lurline Idol covered with gauge and tape in tact.  HR 82  RR 17  Oxygen  saturation 96% on RA.  Good strong cough.  Pt suctioning mouth.  Pt states he is breathing fine.

## 2019-08-27 NOTE — Progress Notes (Signed)
Physical Therapy Treatment Patient Details Name: Devon Peterson MRN: [id number] DOB: 01/05/1956 Today's Date: 08/27/2019    History of Present Illness Significant Hospital Events 5/04 admitted5/05 seen by ENT as initial evaluation underwent flexible laryngoscopy5/06 to operating room for direct laryngoscopy and biopsy, and tracheostomy placement pulmonary asked to evaluate postop for hypotension5/11 G tube placed5/23 tracheostomy bleeding >> no interventions needs5/24 transfer to ICU for respiratory distress; placed on ventilator and changed to #4 cuffed trach (unable to pass #6 cuffed trach); ENT changed to #6 cuffed trach5/27 episode of a flutter > Amiodarone boluses only 5/31: Off from mechanical ventilation for 48 hours6/1: Getting up with physical therapy    PT Comments    Pt agitated during session, tells therapist "stop yelling" when educating him on ambulation and "just open the door" when attempting to assess his dizziness while seated EOB prior to ambulation. Constant education on hand placement to improve rising from seated surface with poor carryover requiring reminders to perform safely. Pt ambulates to doorway with shuffling, short gait before leaning onto RW due to possible fatigue, but doesn't answer why he begins leaning on RW. Pt with loss of balance requiring mod A to recover while using RW and encouragement to return to sitting so therapist could check BP. Nurse tech and RN in room while assessing BP; pt remains in recliner, chair alarm on, call bell and suction in lap at EOS. Patient will benefit from continued physical therapy in hospital and recommendations below to increase strength, balance, endurance for safe ADLs and gait.   Follow Up Recommendations  SNF     Equipment Recommendations  None recommended by PT    Recommendations for Other Services       Precautions / Restrictions Precautions Precautions: Fall Precaution Comments: monitor  BP/orthostatic Restrictions Weight Bearing Restrictions: No    Mobility  Bed Mobility   Bed Mobility: Supine to Sit  Supine to sit: Supervision  General bed mobility comments: increased time, comes to EOB with use of bedrail, no physical assist  Transfers Overall transfer level: Needs assistance Equipment used: Rolling walker (2 wheeled) Transfers: Sit to/from Stand Sit to Stand: Min assist;From elevated surface;Min guard  General transfer comment: cues to push from seated surface with poor carryover requiring constant reeducation, min G from elevated bed, min A from low recliner, steadying A once in standing due to posterior weight  Ambulation/Gait Ambulation/Gait assistance: Min assist;+2 safety/equipment Gait Distance (Feet): 6 Feet Assistive device: Rolling walker (2 wheeled) Gait Pattern/deviations: Step-to pattern;Shuffle;Trunk flexed Gait velocity: decreased   General Gait Details: slow, shuffling steps with increased trunk flexion leaning on RW with fatigue, 1 loss of balance requiring mod A to recover and A to return to sitting with control; returned to sitting and BP 97/71mmHg with nurse tech in room assisting, pt denies dizziness, pain, SOB   Stairs             Wheelchair Mobility    Modified Rankin (Stroke Patients Only)       Balance   Sitting-balance support: Bilateral upper extremity supported;Feet supported Sitting balance-Leahy Scale: Good Sitting balance - Comments: seated EOB   Standing balance support: Bilateral upper extremity supported;During functional activity Standing balance-Leahy Scale: Poor Standing balance comment: reliant on RW       Cognition Arousal/Alertness: Awake/alert Behavior During Therapy: Flat affect;Agitated Overall Cognitive Status: Within Functional Limits for tasks assessed    General Comments: "just open the door"      Exercises      General Comments  General comments (skin integrity, edema, etc.): HR in  90s, SpO2 98% with ambulation, no increased work of breathing      Pertinent Vitals/Pain Pain Assessment: Faces Faces Pain Scale: Hurts a little bit Pain Location: throat Pain Descriptors / Indicators: Sore Pain Intervention(s): Limited activity within patient's tolerance;Monitored during session;Premedicated before session    Home Living                      Prior Function            PT Goals (current goals can now be found in the care plan section) Acute Rehab PT Goals Patient Stated Goal: agreed to mobility PT Goal Formulation: With patient Time For Goal Achievement: 09/05/19 Potential to Achieve Goals: Good Progress towards PT goals: Progressing toward goals    Frequency    Min 2X/week      PT Plan Current plan remains appropriate    Co-evaluation              AM-PAC PT "6 Clicks" Mobility   Outcome Measure  Help needed turning from your back to your side while in a flat bed without using bedrails?: None Help needed moving from lying on your back to sitting on the side of a flat bed without using bedrails?: None Help needed moving to and from a bed to a chair (including a wheelchair)?: A Little Help needed standing up from a chair using your arms (e.g., wheelchair or bedside chair)?: A Little Help needed to walk in hospital room?: A Lot Help needed climbing 3-5 steps with a railing? : Total 6 Click Score: 17    End of Session Equipment Utilized During Treatment: Gait belt Activity Tolerance: Patient tolerated treatment well Patient left: in chair;with call bell/phone within reach;with chair alarm set Nurse Communication: Mobility status;Other (comment) (nurse tech/RN in room when checking BP after amb) PT Visit Diagnosis: Muscle weakness (generalized) (M62.81);Difficulty in walking, not elsewhere classified (R26.2)     Time: 4827-0786 PT Time Calculation (min) (ACUTE ONLY): 23 min  Charges:  $Gait Training: 8-22 mins $Therapeutic  Activity: 8-22 mins                      Tori Ondra Deboard PT, DPT 08/27/19, 1:07 PM

## 2019-08-27 NOTE — TOC Progression Note (Signed)
Transition of Care [facility]) - Progression Note    Patient Details  Name: Devon Peterson MRN: [id number] Date of Birth: 19-Jun-1955  [facility] CM/SW Contact  Purcell Mouton, RN Phone Number: 08/27/2019, 11:25 AM  Clinical Narrative:    Pt no longer have a SNF bed. Searching now for SNF to take pt. Pt is asking to go home with no one to care for him. Pt was encouraged to work with PT.    Expected Discharge Plan: Skilled Nursing Facility Barriers to Discharge: Continued Medical Work up  Expected Discharge Plan and Services Expected Discharge Plan: [place] Choice: [place]   Expected Discharge Date: 08/27/19                                     Social Determinants of Health (SDOH) Interventions    Readmission Risk Interventions Readmission Risk Prevention Plan 07/27/2019  Transportation Screening Complete  HRI or Home Care Consult Complete  Social Work Consult for [place] Planning/Counseling Complete  Palliative Care Screening Complete  Medication Review [employer]) Complete  Some recent data might be hidden

## 2019-08-27 NOTE — Progress Notes (Signed)
NAME:  Devon Peterson, MRN:  [id number], DOB:  January 23, 1956, LOS: 74 ADMISSION DATE:  07/07/2019, CONSULTATION DATE:  07/09/19  REFERRING MD:  Alfredia Ferguson, CHIEF COMPLAINT:  Post-operative hypotension   Brief History   64 yo male smoker admitted 5/04 with aspiration pneumonia and found to have hypopharyngeal mass.  Had direct laryngoscopy with biopsy and tracheostomy on 5/06.  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      Past Medical History  COPD, Cocaine abuse  Significant Hospital Events   5/04 admitted 5/05 seen by ENT as initial evaluation underwent flexible laryngoscopy 5/06 OR for direct laryngoscopy + biopsy, and tracheostomy placement, postop for hypotension 5/11 G tube placed 5/23 tracheostomy bleeding >> no interventions needs 5/24 transfer to ICU for respiratory distress; placed on ventilator and changed to #4 cuffed trach (unable to pass #6 cuffed trach); ENT changed to #6 cuffed trach 5/27 episode of a flutter >  Amiodarone boluses only  5/31 Off from mechanical ventilation for 48 hours 6/01 Getting up with physical therapy 6/15 PCCM called back for FOB of LLL 6/17 bronchoscopy 6/18 start trach capping trials, reconsult speech therapy 6/21 Pt has been capping during day, uncapping at night (for unclear reasons) 6/23 decannulated   Consults:  ENT Oncology Radiation oncology Palliative care  Procedures:  Tracheostomy 5/06 >> G tube 5/11 >>   Significant Diagnostic Tests:  CT angio chest 5/04 >> atherosclerosis, severe emphysema, b/l lower lobe consolidation Lt > Rt, 2.7 cm mass LLL CT neck 5/05 >> enhancing hypopharyngeal soft tissue in post cricoid region  Micro Data:  SARS CoV2 PCR 5/04 >> negative Blood 5/04 >> negative Sputum 5/24 >> no org seen > normal flora BAL 6/17 >>  Antimicrobials:  Unasyn 5/04 >> 5/11   Interim history/subjective:   De cannulated yesterday. Stable with no issues. Breathing is fine.   Objective   Blood pressure  107/70, pulse 73, temperature 98.5 F (36.9 C), resp. rate 17, height _0  (1.803 m), weight 43 kg, SpO2 95 %.    FiO2 (%):  [21 %] 21 %   Intake/Output Summary (Last 24 hours) at 08/27/2019 1006 Last data filed at 08/27/2019 0505 Gross per 24 hour  Intake 827 ml  Output 1150 ml  Net -323 ml   Filed Weights   07/20/19 1600 08/10/19 1225 08/22/19 0415  Weight: 44 kg 42.2 kg 43 kg    Examination: Gen:      Catchetic HEENT:  EOMI, sclera anicteric Neck:     No masses; no thyromegaly Lungs:    Clear to auscultation bilaterally; normal respiratory effort CV:         Regular rate and rhythm; no murmurs Abd:      + bowel sounds; soft, non-tender; no palpable masses, no distension Ext:    No edema; adequate peripheral perfusion Skin:      Warm and dry; no rash Neuro: alert and oriented x 3 Psych: normal mood and affect  Resolved problems:  Aspiration pneumonia Acute hypoxic respiratory failure Transient A fib/flutter new this admission. Hypotension from hypovolemia.  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 Initially required tracheostomy due to upper airway obstruction. S/P XRT.  Tolerated day time capping trials over the weekend of 6/19, 6/20.  Stable post decannulation. Continue dressing for 7 days  Lt lower lung mass. -LLL biopsy with benign bronchial wall and lung parenchyma, no evidence of malignancy on submitted tissue Will need follow up CT in 3 months  for revaluation  COPD with Emphysema. -PRN xopenex   PCCM will sign off. Please call with any questions   Signature:   Marshell Garfinkel MD [place] [facility] Please see [email or web address] for pager details.  08/27/2019, 10:06 AM

## 2019-08-28 MED ORDER — SERTRALINE HCL 50 MG PO TABS
50.0000 mg | ORAL_TABLET | Freq: Every day | ORAL | Status: DC
Start: 2019-08-28 — End: 2020-04-21

## 2019-08-28 MED ORDER — ISOSOURCE 1.5 CAL PO LIQD: 237.0000 mL | Freq: Every day | ORAL | 0 refills | Status: DC

## 2019-08-28 MED ORDER — FERROUS SULFATE 325 (65 FE) MG PO TABS: 325.0000 mg | ORAL_TABLET | Freq: Every day | ORAL | Status: DC

## 2019-08-28 MED ORDER — PRO-STAT SUGAR FREE PO LIQD: 30.0000 mL | Freq: Two times a day (BID) | ORAL | 0 refills | Status: DC

## 2019-08-28 MED ORDER — THIAMINE HCL 100 MG PO TABS: 100.0000 mg | ORAL_TABLET | Freq: Every day | ORAL | Status: DC

## 2019-08-28 MED ORDER — POLYETHYLENE GLYCOL 3350 17 G PO PACK: 17.0000 g | PACK | Freq: Every day | ORAL | 0 refills | Status: DC

## 2019-08-28 MED ORDER — METOPROLOL TARTRATE 25 MG PO TABS
12.5000 mg | ORAL_TABLET | Freq: Two times a day (BID) | ORAL | Status: DC
Start: 2019-08-28 — End: 2020-03-18

## 2019-08-28 MED ORDER — PANTOPRAZOLE SODIUM 40 MG PO TBEC
40.0000 mg | DELAYED_RELEASE_TABLET | Freq: Every day | ORAL | Status: DC
Start: 2019-08-28 — End: 2020-04-21

## 2019-08-28 MED ORDER — SENNA 8.6 MG PO TABS
2.0000 | ORAL_TABLET | Freq: Every day | ORAL | 0 refills | Status: DC
Start: 2019-08-28 — End: 2020-04-21

## 2019-08-28 MED ORDER — CHLORHEXIDINE GLUCONATE 0.12% ORAL RINSE (MEDLINE KIT): 15.0000 mL | Freq: Two times a day (BID) | OROMUCOSAL | 0 refills | Status: AC

## 2019-08-28 MED ORDER — OXYCODONE HCL 5 MG PO TABS: 5.0000 mg | ORAL_TABLET | Freq: Three times a day (TID) | ORAL | 0 refills | Status: AC | PRN

## 2019-08-28 NOTE — Progress Notes (Signed)
AVS given to PTAR and explained over the phone with the receiving facility [facility]. Medications and follow up appointments have been explained with pt nurse verbalizing understanding.

## 2019-08-28 NOTE — Discharge Summary (Signed)
Physician Discharge Summary  Devon Peterson [id number] DOB: November 07, 1955 DOA: 07/07/2019  PCP: Patient, No Pcp Per  Admit date: 07/07/2019 Discharge date: 08/28/2019  Admitted From: Home Disposition: SNF   Recommendations for Outpatient Follow-up:  1. Follow up with PCP, oncology, radiation oncology in the next 2 weeks 2. Continue tube feeds until patient taking per oral adequately 3. Keep tracheostomy decannulated site clean and covered without submerging.  Home Health: N/A Equipment/Devices: Per SNF. G tube in place. Discharge Condition: Stable CODE STATUS: Full Diet recommendation: Full liquid, advance as tolerated. Also give isosource five times daily per tube.  Brief/Interim Summary: 64 year old male with history of COPD, cocaine abuse, tobacco abuse presented on Jul 07, 2019 with aspiration pneumonia and was found to have hypopharyngeal mass. He had direct laryngoscopy with biopsy and tracheostomy on Jul 09, 2019. 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 He has had a prolonged hospitalization. He had a G-tube placed on Jul 14, 2019. He had some tracheostomy bleeding on Jul 26, 2019 which resolved without any intervention. On Jul 27, 2019, he was transferred to ICU for respiratory distress; placed on ventilator. On Jul 30, 2019, he had episode of atrial flutter which responded to amiodarone boluses only. Subsequently, he has been off of mechanical ventilation. He was transferred back to [facility] service on 08/05/19. Ultimately completed radiation therapy and had tracheostomy decannulated 6/23. He continues to display significant deconditioning for which SNF placement is being pursued.   Discharge Diagnoses:  Principal Problem:   Aspiration pneumonia ([place]) Active Problems:   Sepsis ([place])   Dysphagia   Severe protein-calorie malnutrition ([place])   Alcohol use   Pressure injury of skin   Lung mass   Neck mass   Tracheostomy care ([place])   Squamous cell carcinoma  of neck   Palliative care encounter   Acute respiratory insufficiency  Acute hypoxic respiratory failure: Resolved, decannulated 6/23 and stable on room air. Pulmonology has signed off.  Aspiration pneumonia: Resolved. Treated with antibiotics - Continue diet advancement per SLP. Making progress s/p XRT with SLP. Recommend continued SLP evaluations as he progresses.  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 Will continue management per oncology.   Left lower lung mass: Nondiagnostic biopsy  - Further management per oncology  Dysphagia Cachexia Severe protein calorie malnutrition Generalized conditioning - Continue tube feeds via G-tube (placed 07/14/2019) until durably tolerating a diet.   - Overall prognosis is guarded to poor. Patient remains full code. Palliative care has evaluated patient as well during the hospitalization. No evidence of current infection.  Hypomagnesemia: Supplemented with improvement.   COPD with emphysema - No exacerbation, continue prns  Anemia of critical illness, iron deficiency, chronic disease - Hemoglobin stable. Monitor intermittently.  Paroxysmal/transient A. Fib/flutter - Had required amiodarone boluses briefly. Currently rate controlled. Continue metoprolol  Stage II sacral pressure ulcer - Present on admission - Offloading recommended.  Depression - Continue Zoloft  Stage II pressure injuries to mid sacrum: Present on admission - 3 separate areas. Continue wound care Pressure Injury 07/07/19 Sacrum Mid;Upper Stage 2 - Partial thickness loss of dermis presenting as a shallow open injury with a red, pink wound bed without slough. 3 each small separate open areas (Active)  07/07/19 2107  Location: Sacrum  Location Orientation: Mid;Upper  Staging: Stage 2 - Partial thickness loss of dermis presenting as a shallow open injury with a red, pink wound bed without slough.  Wound Description (Comments): 3 each  small separate open areas  Present on Admission: Yes   Severe protein calorie malnutrition: BMI 13.  - Supplement protein as able.   Discharge Instructions Discharge Instructions    Discharge wound care:   Complete by: As directed    Keep tracheostomy site covered, do not submerge, clean with soap and water as needed.     Allergies as of 08/28/2019      Reactions   Albumin (human) Itching   May be able to handle at low rates.      Medication List    STOP taking these medications   azithromycin 250 MG tablet Commonly known as: ZITHROMAX   cefdinir 300 MG capsule Commonly known as: OMNICEF     TAKE these medications   chlorhexidine gluconate (MEDLINE KIT) 0.12 % solution Commonly known as: PERIDEX 15 mLs by Mouth Rinse route 2 (two) times daily.   feeding supplement (PRO-STAT SUGAR FREE 64) Liqd Place 30 mLs into feeding tube 2 (two) times daily.   ferrous sulfate 325 (65 FE) MG tablet Take 1 tablet (325 mg total) by mouth daily.   Isosource 1.5 Cal Liqd Place 237 mLs into feeding tube 5 (five) times daily.   metoprolol tartrate 25 MG tablet Commonly known as: LOPRESSOR Take 0.5 tablets (12.5 mg total) by mouth 2 (two) times daily.   oxyCODONE 5 MG immediate release tablet Commonly known as: Roxicodone Take 1 tablet (5 mg total) by mouth every 8 (eight) hours as needed for up to 3 days for severe pain.   pantoprazole 40 MG tablet Commonly known as: Protonix Take 1 tablet (40 mg total) by mouth daily.   polyethylene glycol 17 g packet Commonly known as: MIRALAX / GLYCOLAX Take 17 g by mouth daily.   senna 8.6 MG Tabs tablet Commonly known as: SENOKOT Take 2 tablets (17.2 mg total) by mouth daily.   sertraline 50 MG tablet Commonly known as: Zoloft Take 1 tablet (50 mg total) by mouth daily.   thiamine 100 MG tablet Take 1 tablet (100 mg total) by mouth daily.            Discharge Care Instructions  (From admission, onward)         Start      Ordered   08/28/19 0000  Discharge wound care:       Comments: Keep tracheostomy site covered, do not submerge, clean with soap and water as needed.   08/28/19 0809          Follow-up Information    Eppie Gibson, MD Follow up.   Specialty: Radiation Oncology Contact information: [address] [place] [address] [phone]        Ladell Pier, MD .   Specialty: Oncology Contact information: [place] [address] [phone]        [facility] Follow up.   Specialty: Pulmonology Contact information: [place] [address] [phone]             Allergies  Allergen Reactions   Albumin (Human) Itching    May be able to handle at low rates.    Consultants:   PCCM, ENT, oncology, palliative care, radiation oncology.   Procedures:  Intubation Tracheostomy on 07/09/2019, decannulation 6/23. G-tube on 07/14/2019  Procedures/Studies: CT CHEST W CONTRAST  Result Date: 08/17/2019 CLINICAL DATA:  Laryngeal cancer, lung mass. EXAM: CT CHEST WITH CONTRAST TECHNIQUE: Multidetector CT imaging of the chest was performed during intravenous contrast administration. CONTRAST:  61m OMNIPAQUE  IOHEXOL 300 MG/ML  SOLN COMPARISON:  07/07/2019. FINDINGS: Cardiovascular: Coronary artery calcification. Heart size normal. No pericardial effusion. Mediastinum/Nodes: Hypopharyngeal mass is incidentally imaged. No pathologically enlarged mediastinal, hilar or axillary lymph nodes. Esophagus is grossly unremarkable. Lungs/Pleura: Centrilobular emphysema. Mild scarring in the apices. Persistent 2.1 x 2.8 cm low-attenuation lesion in the superior segment left lower lobe, stable in size from 07/07/2019. Surrounding collapse/consolidation in the left lower lobe. Basilar predominant peribronchovascular nodularity, ground-glass and consolidation seen on 07/07/2019 have otherwise largely resolved. No pleural  fluid. Tracheostomy is in place. Airway is otherwise unremarkable. Upper Abdomen: Visualized portions of the liver, adrenal glands, kidneys, spleen, pancreas, stomach and bowel are grossly unremarkable. Percutaneous gastrostomy tube is partially imaged. Musculoskeletal: No worrisome lytic or sclerotic lesions. IMPRESSION: 1. Low-attenuation lesion in the superior segment left lower lobe, unchanged from 07/07/2019 and worrisome for malignancy. Residual surrounding collapse/consolidation in the left lower lobe. 2. Basilar predominant peribronchovascular nodularity, ground-glass and consolidation seen on 07/07/2019 have otherwise largely resolved. 3. Hypopharyngeal mass, consistent with the given history of laryngeal cancer. 4. Coronary artery calcification. 5.  Emphysema (ICD10-J43.9). Electronically Signed   By: Lorin Picket M.D.   On: 08/17/2019 11:57   DG Chest Port 1 View  Result Date: 08/20/2019 CLINICAL DATA:  Status post bronchoscopy. EXAM: PORTABLE CHEST 1 VIEW COMPARISON:  Jul 31, 2019. FINDINGS: The heart size and mediastinal contours are within normal limits. Tracheostomy tube is in grossly good position. No pneumothorax or pleural effusion is noted. Both lungs are clear. The visualized skeletal structures are unremarkable. IMPRESSION: No active disease. No pneumothorax is noted. Electronically Signed   By: Marijo Conception M.D.   On: 08/20/2019 13:01   DG Chest Port 1 View  Result Date: 07/31/2019 CLINICAL DATA:  Respiratory failure EXAM: PORTABLE CHEST 1 VIEW COMPARISON:  07/29/2019 FINDINGS: No significant change in AP portable examination. Tracheostomy. There is probable left basilar atelectasis or consolidation and associated layering pleural effusion. No new airspace opacity. The heart and mediastinum are unremarkable. IMPRESSION: 1. No significant change in AP portable examination. There is probable left basilar atelectasis or consolidation and associated layering pleural effusion. No new  airspace opacity. 2.  Tracheostomy. Electronically Signed   By: Eddie Candle M.D.   On: 07/31/2019 08:16   DG Swallowing Func-Speech Pathology  Result Date: 08/24/2019 Objective Swallowing Evaluation: Type of Study: MBS-Modified Barium Swallow Study  Patient Details Name: Dannell Gortney MRN: [id number] Date of Birth: [birth year] Today's Date: 08/24/2019 Time: SLP Start Time (ACUTE ONLY): 1202 -SLP Stop Time (ACUTE ONLY): 1229 SLP Time Calculation (min) (ACUTE ONLY): 27 min Past Medical History: Past Medical History: Diagnosis Date  COPD (chronic obstructive pulmonary disease) ([place])   Medical history non-contributory  Past Surgical History: Past Surgical History: Procedure Laterality Date  BIOPSY  08/20/2019  Procedure: BIOPSY;  Surgeon: Chesley Mires, MD;  Location: [facility];  Service: Endoscopy;;  BRONCHIAL WASHINGS  08/20/2019  Procedure: BRONCHIAL WASHINGS;  Surgeon: Chesley Mires, MD;  Location: [facility];  Service: Endoscopy;;  LLL BAL  DIRECT LARYNGOSCOPY N/A 07/09/2019  Procedure: DIRECT LARYNGOSCOPY WITH BIOPSY;  Surgeon: Jerrell Belfast, MD;  Location: [facility];  Service: ENT;  Laterality: N/A;  IR FLUORO RM 30-60 MIN  07/13/2019  IR GASTROSTOMY TUBE MOD SED  07/14/2019  NO PAST SURGERIES    TRACHEOSTOMY TUBE PLACEMENT N/A 07/09/2019  Procedure: TRACHEOSTOMY;  Surgeon: Jerrell Belfast, MD;  Location: [facility];  Service: ENT;  Laterality: N/A;  VIDEO BRONCHOSCOPY Left 08/20/2019  Procedure: VIDEO BRONCHOSCOPY WITH FLUORO;  Surgeon: Chesley Mires, MD;  Location: [facility];  Service: Endoscopy;  Laterality: Left; HPI: HPI: 64 yo with progressive dysphagia, appears cachetic, found to have likely aspiration pna per CT - enhancing mass LLL- suspected aspiration.  Imaging studies also concerning for soft tissue mass in hypopharynx. Pt has been a consumer of ETOH - beer and smokes cigarettes. CT neck showed "Enhancing hypopharyngeal soft tissue in the post cricoid region extending superiorly with effacement of  the pyriform sinuses and thickening of the aryepiglottic folds."  Pt desired to eat/drink and swallow evaluation had been ordered. SLP advised an MBS to view impact of mass on pharyngeal swallow. This was completed 07/08/10, prior to trach placement, with recommendation for NPO except ice chips after oral care.  Subjective: Pt awake, resting in bed. no family present. Assessment / Plan / Recommendation CHL IP CLINICAL IMPRESSIONS 08/24/2019 Clinical Impression Significantly improved swallow function compared to prior test likely due to significant decrease in size of hypopharyngeal mass from XRT. He continues with mildly decreased oral coordination with lingual pumping - more pronounced with puree/cracker than liquids.  Pt does continue with impaired laryngeal elevation/closure resulting in trace aspiration of secretions mostly silent in nature.  Cued "fast and strong" with double swallows decreased retention and assisted to sustain airway closure.  Cued cough/expectoration effective to clear pharyngeal retention of barium and proximal tracheal secretions.  Pt is having some aspiration of secretions that may mix with barium- chronic in nature, but also occuring after the swallow.  Did not test chin tuck due to pyriform sinus retention that would spill into open larynx with posture - result in aspiration.  Given pt has not consumed po for approx one month and he has sensory impairment - would recommend to return to po conservatively.  Recommend initiate a clear or full liquid diet. SlP will follow up to provide pt with trismus device and for dietary tolerance, advancement. Pt's tracheostomy tube was capped during testing. SLP Visit Diagnosis Dysphagia, oropharyngeal phase (R13.12) Attention and concentration deficit following -- Frontal lobe and executive function deficit following -- Impact on safety and function Moderate aspiration risk   CHL IP TREATMENT RECOMMENDATION 08/24/2019 Treatment Recommendations Therapy as  outlined in treatment plan below   Prognosis 08/24/2019 Prognosis for Safe Diet Advancement Good Barriers to Reach Goals Behavior Barriers/Prognosis Comment -- CHL IP DIET RECOMMENDATION 08/24/2019 SLP Diet Recommendations (No Data) Liquid Administration via Cup;Straw Medication Administration Crushed with puree Compensations Slow rate;Small sips/bites Postural Changes Seated upright at 90 degrees;Remain semi-upright after after feeds/meals (Comment)   CHL IP OTHER RECOMMENDATIONS 08/24/2019 Recommended Consults -- Oral Care Recommendations Oral care QID Other Recommendations --   CHL IP FOLLOW UP RECOMMENDATIONS 08/24/2019 Follow up Recommendations Skilled Nursing facility   [facility] IP FREQUENCY AND DURATION 08/24/2019 Speech Therapy Frequency (ACUTE ONLY) min 2x/week Treatment Duration 2 weeks      CHL IP ORAL PHASE 08/24/2019 Oral Phase Impaired Oral - Pudding Teaspoon -- Oral - Pudding Cup -- Oral - Honey Teaspoon -- Oral - Honey Cup -- Oral - Nectar Teaspoon Delayed oral transit;Weak lingual manipulation Oral - Nectar Cup Delayed oral transit;Weak lingual manipulation Oral - Nectar Straw -- Oral - Thin Teaspoon Delayed oral transit;Weak lingual manipulation Oral - Thin Cup Weak lingual manipulation;Delayed oral transit Oral - Thin Straw Weak lingual manipulation;Delayed oral transit Oral - Puree Weak lingual manipulation;Delayed oral transit;Premature spillage Oral - Mech Soft Weak lingual manipulation;Delayed oral transit;Impaired mastication;Premature spillage Oral - Regular -- Oral - Multi-Consistency -- Oral - Pill --  Oral Phase - Comment lingual rocking with all boluses but pt was protective of his airway  CHL IP PHARYNGEAL PHASE 08/24/2019 Pharyngeal Phase Impaired Pharyngeal- Pudding Teaspoon -- Pharyngeal -- Pharyngeal- Pudding Cup -- Pharyngeal -- Pharyngeal- Honey Teaspoon -- Pharyngeal -- Pharyngeal- Honey Cup -- Pharyngeal -- Pharyngeal- Nectar Teaspoon Delayed swallow initiation-vallecula;Reduced tongue base  retraction;Reduced laryngeal elevation;Reduced pharyngeal peristalsis;Reduced epiglottic inversion;Pharyngeal residue - pyriform Pharyngeal Material does not enter airway Pharyngeal- Nectar Cup Reduced epiglottic inversion;Reduced laryngeal elevation;Reduced pharyngeal peristalsis;Pharyngeal residue - pyriform Pharyngeal Material does not enter airway Pharyngeal- Nectar Straw Penetration/Aspiration during swallow;Reduced laryngeal elevation;Reduced airway/laryngeal closure;Reduced tongue base retraction;Pharyngeal residue - pyriform Pharyngeal Material does not enter airway Pharyngeal- Thin Teaspoon Penetration/Aspiration during swallow;Reduced laryngeal elevation;Reduced airway/laryngeal closure;Pharyngeal residue - pyriform;Penetration/Apiration after swallow Pharyngeal Material enters airway, passes BELOW cords without attempt by patient to eject out (silent aspiration);Material enters airway, passes BELOW cords then ejected out Pharyngeal- Thin Cup Penetration/Aspiration during swallow;Penetration/Apiration after swallow;Pharyngeal residue - pyriform;Reduced anterior laryngeal mobility;Reduced laryngeal elevation;Reduced airway/laryngeal closure Pharyngeal Material enters airway, passes BELOW cords then ejected out Pharyngeal- Thin Straw Reduced anterior laryngeal mobility;Reduced laryngeal elevation;Pharyngeal residue - pyriform;Reduced airway/laryngeal closure Pharyngeal Material does not enter airway Pharyngeal- Puree WFL;Delayed swallow initiation-vallecula Pharyngeal Material does not enter airway Pharyngeal- Mechanical Soft WFL;Delayed swallow initiation-vallecula Pharyngeal Material does not enter airway Pharyngeal- Regular -- Pharyngeal -- Pharyngeal- Multi-consistency -- Pharyngeal -- Pharyngeal- Pill -- Pharyngeal -- Pharyngeal Comment Cued effortful swallow and double swallows with cough/expectoration most effective strategies to maximize airway protection; Pt is having some aspiration of secretions -  chronic in nature.  Did not test chin tuck due to pyriform sinus retention that would result in aspiration.  CHL IP CERVICAL ESOPHAGEAL PHASE 08/24/2019 Cervical Esophageal Phase -- Pudding Teaspoon -- Pudding Cup -- Honey Teaspoon -- Honey Cup -- Nectar Teaspoon -- Nectar Cup -- Nectar Straw -- Thin Teaspoon -- Thin Cup -- Thin Straw -- Puree -- Mechanical Soft -- Regular -- Multi-consistency -- Pill -- Cervical Esophageal Comment Much improved clearance of barium into esophagus- only minimal retention at pyriform sinus Kathleen Lime, MS [facility] SLP Acute Rehab Services Office [phone] [name] 08/24/2019, 2:22 PM              DG C-ARM BRONCHOSCOPY  Result Date: 08/20/2019 C-ARM BRONCHOSCOPY: Fluoroscopy was utilized by the requesting physician.  No radiographic interpretation.     Subjective: Feels well, no dyspnea, pain is controlled, only taking mild-moderate amounts by mouth. Still feels weak.   Discharge Exam: Vitals:   08/28/19 0626 08/28/19 0937  BP: 106/72 99/69  Pulse: 74 76  Resp: 20 18  Temp: 98.2 F (36.8 C) 98.5 F (36.9 C)  SpO2: 100% 100%   General: Cachectic in no distres Cardiovascular: RRR, S1/S2 +, no rubs, no gallops Respiratory: Nonlabored and clear. Trach site c/d/i. Abdominal: Soft, NT, ND, bowel sounds +. G tube site c/d/i. Extremities: No edema, no cyanosis  Labs: BNP (last 3 results) Recent Labs    04/17/19 1211  BNP 12.2   Basic Metabolic Panel: Recent Labs  Lab 08/22/19 0415 08/23/19 0441 08/26/19 1019  NA 134* 138 137  K 4.0 4.3 4.1  CL 96* 97* 99  CO2 _0 GLUCOSE 133* 91 122*  BUN _1 CREATININE 0.46* 0.43* 0.43*  CALCIUM 8.8* 9.3 9.0  MG 1.7 1.8 1.6*   Liver Function Tests: Recent Labs  Lab 08/23/19 0441  AST 17  ALT 13  ALKPHOS 60  BILITOT 0.3  PROT 6.6  ALBUMIN 3.2*   No results for input(s): LIPASE, AMYLASE in the last 168 hours. No results for input(s): AMMONIA in the last 168 hours. CBC: Recent Labs   Lab 08/22/19 0415 08/23/19 0441 08/25/19 0359  WBC 3.5* 3.1* 3.5*  HGB 9.9* 9.3* 10.2*  HCT 30.7* 29.1* 31.4*  MCV 86.2 86.4 85.8  PLT 248 212 249   Cardiac Enzymes: No results for input(s): CKTOTAL, CKMB, CKMBINDEX, TROPONINI in the last 168 hours. BNP: Invalid input(s): POCBNP CBG: Recent Labs  Lab 08/22/19 0410 08/24/19 0052 08/24/19 0843 08/24/19 0929 08/24/19 1633  GLUCAP 143* 86 65* 108* 131*   D-Dimer No results for input(s): DDIMER in the last 72 hours. Hgb A1c No results for input(s): HGBA1C in the last 72 hours. Lipid Profile No results for input(s): CHOL, HDL, LDLCALC, TRIG, CHOLHDL, LDLDIRECT in the last 72 hours. Thyroid function studies No results for input(s): TSH, T4TOTAL, T3FREE, THYROIDAB in the last 72 hours.  Invalid input(s): FREET3 Anemia work up No results for input(s): VITAMINB12, FOLATE, FERRITIN, TIBC, IRON, RETICCTPCT in the last 72 hours. Urinalysis    Component Value Date/Time   COLORURINE YELLOW 07/07/2019 1729   APPEARANCEUR HAZY (A) 07/07/2019 1729   LABSPEC 1.016 07/07/2019 1729   PHURINE 5.0 07/07/2019 1729   GLUCOSEU NEGATIVE 07/07/2019 1729   HGBUR NEGATIVE 07/07/2019 1729   BILIRUBINUR NEGATIVE 07/07/2019 1729   KETONESUR NEGATIVE 07/07/2019 1729   PROTEINUR 30 (A) 07/07/2019 1729   UROBILINOGEN 1.0 10/28/2008 0458   NITRITE NEGATIVE 07/07/2019 [place] 07/07/2019 1729    Microbiology Recent Results (from the past 240 hour(s))  Culture, respiratory     Status: None   Collection Time: 08/20/19 10:50 AM   Specimen: Bronchoalveolar Lavage; Respiratory  Result Value Ref Range Status   Specimen Description   Final    BRONCHIAL ALVEOLAR LAVAGE Performed at [facility] [address] [place], [place] [address]    Special Requests   Final    NONE Performed at [facility], [place] [address] [place], [place] [address]    Gram Stain   Final    FEW WBC PRESENT,  PREDOMINANTLY PMN RARE GRAM POSITIVE COCCI RARE GRAM POSITIVE RODS    Culture   Final    Consistent with normal respiratory flora. Performed at [facility], [place] [address] [place], [place] [address]    Report Status 08/22/2019 FINAL  Final  Acid Fast Smear (AFB)     Status: None   Collection Time: 08/20/19 10:50 AM   Specimen: Bronchial Alveolar Lavage  Result Value Ref Range Status   AFB Specimen Processing Concentration  Final   Acid Fast Smear Negative  Final    Comment: (NOTE) Performed At: [facility] [address] [place], [place] [id number] Rush Farmer MD [id number]    Source (AFB) BRONCHIAL ALVEOLAR LAVAGE  Final    Comment: Performed at [facility], [place] [address], [place], [place] [address]  Fungus Culture With Stain     Status: None (Preliminary result)   Collection Time: 08/20/19 10:50 AM   Specimen: Bronchial Alveolar Lavage  Result Value Ref Range Status   Fungus Stain Final report  Final    Comment: (NOTE) Performed At: [facility] [place], [place] [id number] Rush Farmer MD [id number]    Fungus (Mycology) Culture PENDING  Incomplete   Fungal Source BRONCHIAL ALVEOLAR LAVAGE  Final    Comment: Performed at [facility], [address] [place], [place] [address]  Fungus Culture Result     Status: None   Collection Time: 08/20/19 10:50 AM  Result Value Ref Range Status   Result 1 Comment  Final    Comment: (NOTE) KOH/Calcofluor preparation:  no fungus observed. Performed At: [facility] [address] [place], [place] [id number] Rush Farmer MD [id number]   SARS Coronavirus 2 by RT PCR (hospital order, performed in [facility] hospital lab) Nasopharyngeal Nasopharyngeal Swab     Status: None   Collection Time: 08/27/19 10:35 AM   Specimen: Nasopharyngeal Swab  Result Value Ref Range Status   SARS Coronavirus 2 NEGATIVE NEGATIVE Final    Comment:  (NOTE) SARS-CoV-2 target nucleic acids are NOT DETECTED.  The SARS-CoV-2 RNA is generally detectable in upper and lower respiratory specimens during the acute phase of infection. The lowest concentration of SARS-CoV-2 viral copies this assay can detect is 250 copies / mL. A negative result does not preclude SARS-CoV-2 infection and should not be used as the sole basis for treatment or other patient management decisions.  A negative result may occur with improper specimen collection / handling, submission of specimen other than nasopharyngeal swab, presence of viral mutation(s) within the areas targeted by this assay, and inadequate number of viral copies (<250 copies / mL). A negative result must be combined with clinical observations, patient history, and epidemiological information.  Fact Sheet for Patients:   StrictlyIdeas.no  Fact Sheet for Healthcare Providers: BankingDealers.co.za  This test is not yet approved or  cleared by the [place] FDA and has been authorized for detection and/or diagnosis of SARS-CoV-2 by FDA under an Emergency Use Authorization (EUA).  This EUA will remain in effect (meaning this test can be used) for the duration of the COVID-19 declaration under Section 564(b)(1) of the Act, 21 U.S.C. section 360bbb-3(b)(1), unless the authorization is terminated or revoked sooner.  Performed at [facility], [place] [address], [place], [place] [address]     Time coordinating discharge: Approximately 40 minutes  Patrecia Pour, MD  [facility] 08/28/2019, 11:41 AM

## 2019-08-28 NOTE — NC FL2 (Signed)
[place] LEVEL OF CARE SCREENING TOOL     IDENTIFICATION  Patient Name: Devon Peterson Birthdate: May 26, 1955 Sex: male Admission Date (Current Location): 07/07/2019  [facility] and [place] Number:  Herbalist and Address:  [facility],  [address], [place]      Provider Number: [id number]  Attending Physician Name and Address:  Patrecia Pour, MD  Relative Name and Phone Number:  Kamdin Follett sister [phone]    Current Level of Care: Hospital Recommended Level of Care: [place] Prior Approval Number:    Date Approved/Denied:   PASRR Number: [id number]  Discharge Plan: SNF    Current Diagnoses: Patient Active Problem List   Diagnosis Date Noted  . Acute respiratory insufficiency   . Squamous cell carcinoma of neck   . Palliative care encounter   . Tracheostomy care ([place])   . 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 Lung mass   . Neck mass   . Pressure injury of skin 07/08/2019  . Aspiration pneumonia ([place]) 07/07/2019  . Dysphagia 07/07/2019  . Severe protein-calorie malnutrition ([place]) 07/07/2019  . Alcohol use 07/07/2019  . Sepsis ([place]) 04/17/2019  . COPD exacerbation ([place])   . Lactic acidosis   . Tobacco abuse   . Alcohol abuse     Orientation RESPIRATION BLADDER Height & Weight     Self, Time, Situation, Place  Normal (Room Air Sats 100%) Continent Weight: 43 kg Height:  '5\' 11"'$  (180.3 cm)  BEHAVIORAL SYMPTOMS/MOOD NEUROLOGICAL BOWEL NUTRITION STATUS      Continent Diet, Supplemental, Feeding tube (Full liquid, has G tube if needed for supplements)  AMBULATORY STATUS COMMUNICATION OF NEEDS Skin   Extensive Assist Verbally Normal, Other (Comment) (old trach site)                       Personal Care Assistance Level of Assistance  Bathing, Feeding, Dressing Bathing Assistance: Limited assistance Feeding assistance: Independent (Pt can eat and has G tube if needed) Dressing  Assistance: Limited assistance     Functional Limitations Info  Sight, Hearing, Speech Sight Info: Adequate Hearing Info: Adequate Speech Info: Adequate    SPECIAL CARE FACTORS FREQUENCY  PT (By licensed PT), OT (By licensed OT)     PT Frequency: Eval and Treat OT Frequency: Eval and Treat            Contractures Contractures Info: Not present    Additional Factors Info  Code Status, Allergies Code Status Info: FULL Allergies Info: Albumin, Human           Current Medications (08/28/2019):  This is the current hospital active medication list Current Facility-Administered Medications  Medication Dose Route Frequency Provider Last Rate Last Admin  . acetaminophen (TYLENOL) 160 MG/5ML solution 650 mg  650 mg Oral Q6H PRN Cherene Altes, MD   650 mg at 08/27/19 2237  . ascorbic acid (VITAMIN C) 500 MG/5ML syrup 100 mg  100 mg Per Tube BID Chesley Mires, MD   100 mg at 08/28/19 0831  . chlorhexidine gluconate (MEDLINE KIT) (PERIDEX) 0.12 % solution 15 mL  15 mL Mouth Rinse BID Chesley Mires, MD   15 mL at 08/28/19 0833  . Chlorhexidine Gluconate Cloth 2 % PADS 6 each  6 each Topical Daily Julian Hy, DO   6 each at 08/28/19 [id number]  . docusate (COLACE) 50 MG/5ML liquid 100 mg  100 mg Per Tube BID Cherene Altes, MD  100 mg at 08/28/19 0830  . feeding supplement (OSMOLITE 1.5 CAL) liquid 237 mL  237 mL Per Tube 5 X Daily Chesley Mires, MD 0 mL/hr at 08/05/19 2016 237 mL at 08/28/19 0830  . feeding supplement (PRO-STAT SUGAR FREE 64) liquid 30 mL  30 mL Per Tube BID Chesley Mires, MD   30 mL at 08/28/19 0832  . ferrous sulfate 300 (60 Fe) MG/5ML syrup 300 mg  300 mg Per Tube BID WC Chesley Mires, MD   300 mg at 08/28/19 0830  . free water 120 mL  120 mL Per Tube 5 X Daily Chesley Mires, MD   120 mL at 08/28/19 0833  . levalbuterol (XOPENEX) nebulizer solution 0.63 mg  0.63 mg Nebulization Q4H PRN Chesley Mires, MD      . MEDLINE mouth rinse  15 mL Mouth Rinse 10 times per day  Chesley Mires, MD   15 mL at 08/28/19 0834  . metoprolol tartrate (LOPRESSOR) 25 mg/10 mL oral suspension 12.5 mg  12.5 mg Per Tube BID Cherene Altes, MD   12.5 mg at 08/28/19 9675  . ondansetron (ZOFRAN) injection 4 mg  4 mg Intravenous Q6H PRN Allie Bossier, MD   4 mg at 08/22/19 1017  . oxyCODONE (ROXICODONE) 5 MG/5ML solution 5-10 mg  5-10 mg Oral Q3H PRN Cherene Altes, MD   5 mg at 08/28/19 0831  . pantoprazole sodium (PROTONIX) 40 mg/20 mL oral suspension 40 mg  40 mg Per Tube Q24H Chesley Mires, MD   40 mg at 08/27/19 1605  . polyethylene glycol (MIRALAX / GLYCOLAX) packet 17 g  17 g Per Tube Daily Cherene Altes, MD   17 g at 08/27/19 1045  . senna-docusate (Senokot-S) tablet 1 tablet  1 tablet Per Tube BID Cherene Altes, MD   1 tablet at 08/28/19 [phone]  . sertraline (ZOLOFT) 20 MG/ML concentrated solution 50 mg  50 mg Per Tube Daily Chesley Mires, MD   50 mg at 08/28/19 0831  . simethicone (MYLICON) 40 WG/6.6ZL suspension 40 mg  40 mg Per Tube Q6H PRN Frederik Pear, MD   40 mg at 08/23/19 0211  . sodium chloride flush (NS) 0.9 % injection 10-40 mL  10-40 mL Intracatheter PRN Raiford Noble [place], DO   10 mL at 08/03/19 9357  . Sonafine emulsion 1 application  1 application Topical BID PRN Eppie Gibson, MD      . thiamine tablet 100 mg  100 mg Per Tube Daily Allie Bossier, MD   100 mg at 08/28/19 0177  . traMADol (ULTRAM) tablet 50 mg  50 mg Oral Q6H PRN Cherene Altes, MD         Discharge Medications: Please see discharge summary for a list of discharge medications.  Relevant Imaging Results:  Relevant Lab Results:   Additional Information [phone]  Purcell Mouton, RN

## 2019-08-31 NOTE — Anesthesia Postprocedure Evaluation (Signed)
Anesthesia Post Note  Patient: Devon Peterson  Procedure(s) Performed: VIDEO BRONCHOSCOPY WITH FLUORO (Left ) BRONCHIAL WASHINGS BIOPSY     Patient location during evaluation: Endoscopy Anesthesia Type: MAC Level of consciousness: sedated Pain management: pain level controlled Vital Signs Assessment: post-procedure vital signs reviewed and stable Respiratory status: spontaneous breathing Cardiovascular status: stable Postop Assessment: no apparent nausea or vomiting Anesthetic complications: no   No complications documented.  Last Vitals:  Vitals:   08/28/19 0937 08/28/19 1241  BP: 99/69 105/71  Pulse: 76 78  Resp: 18 18  Temp: 36.9 C 37.2 C  SpO2: 100% 99%    Last Pain:  Vitals:   08/28/19 1404  TempSrc:   PainSc: Daisy Jr

## 2019-09-01 DIAGNOSIS — C139 Malignant neoplasm of hypopharynx, unspecified: Secondary | ICD-10-CM | POA: Diagnosis not present

## 2019-09-01 DIAGNOSIS — R918 Other nonspecific abnormal finding of lung field: Secondary | ICD-10-CM | POA: Diagnosis not present

## 2019-09-01 DIAGNOSIS — J392 Other diseases of pharynx: Secondary | ICD-10-CM | POA: Diagnosis not present

## 2019-09-01 DIAGNOSIS — I4892 Unspecified atrial flutter: Secondary | ICD-10-CM | POA: Diagnosis not present

## 2019-09-02 ENCOUNTER — Telehealth: Payer: Self-pay | Admitting: *Deleted

## 2019-09-02 NOTE — Telephone Encounter (Signed)
Provided appointment for 09/22/19 at 10:00 am w/Dr. Benay Spice.

## 2019-09-03 DIAGNOSIS — D509 Iron deficiency anemia, unspecified: Secondary | ICD-10-CM | POA: Diagnosis not present

## 2019-09-03 DIAGNOSIS — E43 Unspecified severe protein-calorie malnutrition: Secondary | ICD-10-CM | POA: Diagnosis not present

## 2019-09-03 DIAGNOSIS — F339 Major depressive disorder, recurrent, unspecified: Secondary | ICD-10-CM | POA: Diagnosis not present

## 2019-09-03 DIAGNOSIS — J449 Chronic obstructive pulmonary disease, unspecified: Secondary | ICD-10-CM | POA: Diagnosis not present

## 2019-09-03 DIAGNOSIS — C4442 Squamous cell carcinoma of skin of scalp and neck: Secondary | ICD-10-CM | POA: Diagnosis not present

## 2019-09-03 DIAGNOSIS — I480 Paroxysmal atrial fibrillation: Secondary | ICD-10-CM | POA: Diagnosis not present

## 2019-09-03 DIAGNOSIS — Z931 Gastrostomy status: Secondary | ICD-10-CM | POA: Diagnosis not present

## 2019-09-14 ENCOUNTER — Ambulatory Visit (INDEPENDENT_AMBULATORY_CARE_PROVIDER_SITE_OTHER): Payer: Medicaid Other | Admitting: Internal Medicine

## 2019-09-14 ENCOUNTER — Encounter: Payer: Self-pay | Admitting: Internal Medicine

## 2019-09-14 ENCOUNTER — Other Ambulatory Visit: Payer: Self-pay

## 2019-09-14 DIAGNOSIS — Z430 Encounter for attention to tracheostomy: Secondary | ICD-10-CM

## 2019-09-14 NOTE — Progress Notes (Signed)
Devon Peterson, male    DOB: [birth year],    MRN: [id number]   Brief patient profile:  57 yobm quit smoking May 2021 at admit    Admit date: 07/07/2019 Discharge date: 08/28/2019  Admitted From: Home Disposition: SNF   Recommendations for Outpatient Follow-up:  1. Follow up with PCP, oncology, radiation oncology in the next 2 weeks 2. Continue tube feeds until patient taking per oral adequately 3. Keep tracheostomy decannulated site clean and covered without submerging.  Home Health: N/A Equipment/Devices: Per SNF. G tube in place. Discharge Condition: Stable CODE STATUS: Full Diet recommendation: Full liquid, advance as tolerated. Also give isosource five times daily per tube.  Brief/Interim Summary: 64 year old male with history of COPD, cocaine abuse, tobacco abuse presented on Jul 07, 2019 with aspiration pneumonia and was found to have hypopharyngeal mass. He had direct laryngoscopy with biopsy and tracheostomy on Jul 09, 2019. 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 He has had a prolonged hospitalization. He had a G-tube placed on Jul 14, 2019. He had some tracheostomy bleeding on Jul 26, 2019 which resolved without any intervention. On Jul 27, 2019, he was transferred to ICU for respiratory distress; placed on ventilator. On Jul 30, 2019, he had episode of atrial flutter which responded to amiodarone boluses only. Subsequently, he has been off of mechanical ventilation. He was transferred back to [facility] service on 08/05/19. Ultimately completed radiation therapy and had tracheostomy decannulated 6/23. He continues to display significant deconditioning for which SNF placement is being pursued.  Discharge Diagnoses:  Principal Problem:   Aspiration pneumonia ([place]) Active Problems:   Sepsis ([place])   Dysphagia   Severe protein-calorie malnutrition ([place])   Alcohol use   Pressure injury of skin   Lung mass   Neck mass   Tracheostomy care ([place])    Squamous cell carcinoma of neck   Palliative care encounter   Acute respiratory insufficiency  Acute hypoxic respiratory failure:Resolved, decannulated 6/23 and stable on room air. Pulmonology has signed off. Aspiration pneumonia:Resolved.Treated with antibiotics - Continue diet advancement per SLP. Making progress s/p XRT with SLP. Recommend continued SLP evaluations as he progresses.  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 Will continue management per oncology.   Left lower lung mass: Nondiagnostic biopsy - Further management per oncology  Dysphagia Cachexia Severe protein calorie malnutrition Generalized conditioning - Continue tube feeds via G-tube (placed 07/14/2019) until durably tolerating a diet.  - Overall prognosis is guarded to poor. Patient remains full code. Palliative care has evaluated patient as well during the hospitalization. No evidence of current infection.  Hypomagnesemia: Supplemented with improvement.   COPD with emphysema - No exacerbation, continue prns  Anemia of critical illness, iron deficiency, chronic disease - Hemoglobin stable. Monitor intermittently.  Paroxysmal/transient A. Fib/flutter - Had required amiodarone boluses briefly. Currently rate controlled. Continue metoprolol  Stage II sacral pressure ulcer - Present on admission - Offloading recommended.  Depression - Continue Zoloft  Stage II pressure injuries to mid sacrum: Present on admission - 3 separate areas. Continue wound care Pressure Injury 07/07/19 Sacrum Mid;Upper Stage 2 - Partial thickness loss of dermis presenting as a shallow open injury with a red, pink wound bed without slough. 3 each small separate open areas (Active)  07/07/19 2107  Location: Sacrum  Location Orientation: Mid;Upper  Staging: Stage 2 - Partial thickness loss of dermis presenting as a shallow open injury with a red, pink wound bed without slough.  Wound  Description (Comments):  3 each small separate open areas  Present on Admission: Yes   Severe protein calorie malnutrition: BMI 13.  - Supplement protein as able.     History of Present Illness  09/14/2019  Pulmonary/ 1st office eval/Katrina Brosh  Chief Complaint  Patient presents with  . Hospitalization Follow-up    Breathing is doing well. He has minimal cough with with clear sputum.    Dyspnea:  Strength slows him not sob , unable to stand s assistance but sometimes able to use walker by himself Cough: none  Sleep: ok 30 degrees no 02  SABA use: none  No obvious day to day or daytime variability or assoc excess/ purulent sputum or mucus plugs or hemoptysis or cp or chest tightness, subjective wheeze or overt sinus or hb symptoms.   Sleeping as above  without nocturnal  or early am exacerbation  of respiratory  c/o's or need for noct saba. Also denies any obvious fluctuation of symptoms with weather or environmental changes or other aggravating or alleviating factors except as outlined above   No unusual exposure hx or h/o childhood pna/ asthma or knowledge of premature birth.  Current Allergies, Complete Past Medical History, Past Surgical History, Family History, and Social History were reviewed in [employer] record.  ROS  The following are not active complaints unless bolded Hoarseness, sore throat, dysphagia improving , dental problems, itching, sneezing,  nasal congestion or discharge of excess mucus or purulent secretions, ear ache,   fever, chills, sweats, unintended wt loss or wt gain, classically pleuritic or exertional cp,  orthopnea pnd or arm/hand swelling  or leg swelling, presyncope, palpitations, abdominal pain, anorexia, nausea, vomiting, diarrhea  or change in bowel habits or change in bladder habits, change in stools or change in urine, dysuria, hematuria,  rash, arthralgias, visual complaints, headache, numbness, weakness or ataxia or problems with  walking or coordination,  change in mood or  memory.           Past Medical History:  Diagnosis Date  . COPD (chronic obstructive pulmonary disease) ([place])   . Medical history non-contributory     Outpatient Medications Prior to Visit  Medication Sig Dispense Refill  . Amino Acids-Protein Hydrolys (FEEDING SUPPLEMENT, PRO-STAT SUGAR FREE 64,) LIQD Place 30 mLs into feeding tube 2 (two) times daily. 887 mL 0  . chlorhexidine gluconate, MEDLINE KIT, (PERIDEX) 0.12 % solution 15 mLs by Mouth Rinse route 2 (two) times daily. 120 mL 0  . ferrous sulfate 325 (65 FE) MG tablet Take 1 tablet (325 mg total) by mouth daily.    . metoprolol tartrate (LOPRESSOR) 25 MG tablet Take 0.5 tablets (12.5 mg total) by mouth 2 (two) times daily.    . Nutritional Supplements (ISOSOURCE 1.5 CAL) LIQD Place 237 mLs into feeding tube 5 (five) times daily.  0  . oxycodone (OXY-IR) 5 MG capsule Take 5 mg by mouth every 8 (eight) hours as needed.    . pantoprazole (PROTONIX) 40 MG tablet Take 1 tablet (40 mg total) by mouth daily.    . polyethylene glycol (MIRALAX / GLYCOLAX) 17 g packet Take 17 g by mouth daily. 14 each 0  . senna (SENOKOT) 8.6 MG TABS tablet Take 2 tablets (17.2 mg total) by mouth daily. 120 tablet 0  . sertraline (ZOLOFT) 50 MG tablet Take 1 tablet (50 mg total) by mouth daily.    Marland Kitchen thiamine 100 MG tablet Take 1 tablet (100 mg total) by mouth daily.     No  facility-administered medications prior to visit.     Objective:     BP 106/64 (BP Location: Left Arm, Cuff Size: Normal)   Pulse 87   Temp 98 F (36.7 C) (Oral)   Ht '5\' 11"'$  (1.803 m)   Wt 106 lb 6.4 oz (48.3 kg)   SpO2 98% Comment: on RA  BMI 14.84 kg/m   SpO2: 98 % (on RA)   Cachectic chronically ill w/c bound bm nad  HEENT : pt wearing mask not removed for exam due to covid - 19 concerns.   NECK :  without JVD/Nodes/TM/ nl carotid upstrokes bilaterally/ trach site appears closed    LUNGS: no acc muscle use,  Min barrel   contour chest wall with bilateral  slightly decreased bs s audible wheeze and  without cough on insp or exp maneuvers and min  Hyperresonant  to  percussion bilaterally     CV:  RRR  no s3 or murmur or increase in P2, and no edema   ABD:  soft and nontender with pos end  insp Hoover's  in the supine position. No bruits or organomegaly appreciated, bowel sounds nl  MS:   Nl gait/  ext warm without deformities, calf tenderness, cyanosis or clubbing No obvious joint restrictions   SKIN: warm and dry without lesions    NEURO:  alert, approp, nl sensorium with  no motor or cerebellar deficits apparent.        I personally reviewed images and agree with radiology impression as follows:  CXR:   Portable 08/20/19  No active disease.          Assessment   No problem-specific Assessment & Plan notes found for this encounter.     Christinia Gully, MD 09/14/2019

## 2019-09-14 NOTE — Patient Instructions (Addendum)
Pulmonary follow up is as needed   You will need to see Dr Wilburn Cornelia for any problems related to your trach wound or voice issues  - plan on seeing him w/in the next 4-6 weeks   No change in medications but  strongly rec covid 19 vaccination asap  [facility] does have an office at [place] and would be a good choice for you at discharge.

## 2019-09-16 ENCOUNTER — Encounter: Payer: Self-pay | Admitting: Internal Medicine

## 2019-09-18 ENCOUNTER — Other Ambulatory Visit (HOSPITAL_COMMUNITY): Payer: Self-pay

## 2019-09-18 DIAGNOSIS — R1310 Dysphagia, unspecified: Secondary | ICD-10-CM

## 2019-09-22 ENCOUNTER — Inpatient Hospital Stay: Payer: Medicaid Other | Attending: Oncology | Admitting: Oncology

## 2019-09-22 ENCOUNTER — Telehealth: Payer: Self-pay | Admitting: Oncology

## 2019-09-22 ENCOUNTER — Other Ambulatory Visit: Payer: Self-pay

## 2019-09-22 VITALS — BP 100/80 | HR 101 | Temp 97.3°F | Resp 17 | Ht 71.0 in | Wt 97.9 lb

## 2019-09-22 DIAGNOSIS — R1310 Dysphagia, unspecified: Secondary | ICD-10-CM | POA: Insufficient documentation

## 2019-09-22 DIAGNOSIS — C329 Malignant neoplasm of larynx, unspecified: Secondary | ICD-10-CM | POA: Insufficient documentation

## 2019-09-22 DIAGNOSIS — R64 Cachexia: Secondary | ICD-10-CM | POA: Insufficient documentation

## 2019-09-22 DIAGNOSIS — D649 Anemia, unspecified: Secondary | ICD-10-CM | POA: Diagnosis not present

## 2019-09-22 DIAGNOSIS — I4891 Unspecified atrial fibrillation: Secondary | ICD-10-CM | POA: Diagnosis not present

## 2019-09-22 DIAGNOSIS — C139 Malignant neoplasm of hypopharynx, unspecified: Secondary | ICD-10-CM | POA: Diagnosis not present

## 2019-09-22 LAB — FUNGAL ORGANISM REFLEX

## 2019-09-22 LAB — FUNGUS CULTURE WITH STAIN

## 2019-09-22 LAB — FUNGUS CULTURE RESULT

## 2019-09-22 NOTE — Telephone Encounter (Signed)
Scheduled per los. Gave avs and calendar  

## 2019-09-22 NOTE — Progress Notes (Signed)
[facility] OFFICE PROGRESS NOTE   Diagnosis: Larynx cancer  INTERVAL HISTORY:   Mr. Fischman was discharged from the hospital on 08/28/2019.  He now resides in a skilled nursing facility.  He continues to have "throat "pain, but this has improved.  No dyspnea.  He is on a liquid diet and would like to advance his diet.  He continues tube feedings.  Objective:  Vital signs in last 24 hours:  Blood pressure 100/80, pulse (!) 101, temperature (!) 97.3 F (36.3 C), temperature source Temporal, resp. rate 17, height 5\' 11"  (1.803 m), weight 97 lb 14.4 oz (44.4 kg), SpO2 100 %.    HEENT: White coat over the tongue, pharynx without erythema Lymphatics: No cervical or supraclavicular nodes.  "Shotty "bilateral axillary nodes Resp: Lungs clear bilaterally Cardio: Regular rate and rhythm GI: No hepatomegaly, left upper quadrant feeding tube Vascular: No leg edema Neuro: Alert and oriented Skin: Tracheostomy site at the anterior neck has healed    Lab Results:  Lab Results  Component Value Date   WBC 3.5 (L) 08/25/2019   HGB 10.2 (L) 08/25/2019   HCT 31.4 (L) 08/25/2019   MCV 85.8 08/25/2019   PLT 249 08/25/2019   NEUTROABS 3.0 08/12/2019    CMP  Lab Results  Component Value Date   NA 137 08/26/2019   K 4.1 08/26/2019   CL 99 08/26/2019   CO2 27 08/26/2019   GLUCOSE 122 (H) 08/26/2019   BUN 16 08/26/2019   CREATININE 0.43 (L) 08/26/2019   CALCIUM 9.0 08/26/2019   PROT 6.6 08/23/2019   ALBUMIN 3.2 (L) 08/23/2019   AST 17 08/23/2019   ALT 13 08/23/2019   ALKPHOS 60 08/23/2019   BILITOT 0.3 08/23/2019   GFRNONAA >60 08/26/2019   GFRAA >60 08/26/2019    Medications: I have reviewed the patient's current medications.   Assessment/Plan: 1. 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 Left lower lobe of the lung mass concerning for primary neoplasm versus metastatic disease -07/07/2019 CT angiogram of the chest 2.7 cm mass in the left lower lobe of the lung             -CT 08/17/2019-persistent low-attenuation lesion in the superior segment of left lower lobe with surrounding consolidation/collapse, stable from      07/07/2019, consolidation and groundglass opacity in the left lower lobe have largely resolved             -08/20/2019 bronchoscopy-biopsy nondiagnostic 3. Cachexia 4. History of aspiration pneumonia 5. Mild anemia 6. Atrial fibrillation with RVR 6. COPD 8. Alcohol abuse 9. Tobacco dependence 10. Cocaine abuse 11.  Odynophagia secondary to radiation-improved    Disposition: Mr. Meech completed radiation for treatment of the obstructing hypopharyngeal carcinoma.  He underwent placement of a tracheostomy for the obstructing tumor prior to treatment with radiation.  The tracheostomy has been removed.  He is tolerating liquids by mouth.  He continues tube feedings.  Mr. Meuth will be scheduled for a restaging neck and chest CT evaluation prior to an office visit in approximately 6 weeks.  We will recommend a speech/swallow evaluation at the skilled nursing facility to determine whether  he can advance his diet and the need for continuing tube feedings.  He underwent a nondiagnostic bronchoscopy 08/20/2019.  The chest CT next month will be to follow-up on the left lower lung mass.  He will be referred for a repeat ENT evaluation.  Betsy Coder, MD  09/22/2019  10:32 AM

## 2019-09-23 DIAGNOSIS — I4892 Unspecified atrial flutter: Secondary | ICD-10-CM | POA: Diagnosis not present

## 2019-09-23 DIAGNOSIS — C139 Malignant neoplasm of hypopharynx, unspecified: Secondary | ICD-10-CM | POA: Diagnosis not present

## 2019-09-23 DIAGNOSIS — R918 Other nonspecific abnormal finding of lung field: Secondary | ICD-10-CM | POA: Diagnosis not present

## 2019-09-23 DIAGNOSIS — J392 Other diseases of pharynx: Secondary | ICD-10-CM | POA: Diagnosis not present

## 2019-09-24 ENCOUNTER — Other Ambulatory Visit: Payer: Self-pay

## 2019-09-24 ENCOUNTER — Encounter: Payer: Self-pay | Admitting: *Deleted

## 2019-09-24 ENCOUNTER — Telehealth: Payer: Self-pay | Admitting: Oncology

## 2019-09-24 DIAGNOSIS — C139 Malignant neoplasm of hypopharynx, unspecified: Secondary | ICD-10-CM

## 2019-09-24 NOTE — Progress Notes (Signed)
On 09/23/19, message to Head and Neck RN Navigator requesting assistance w/patient getting in for f/u with Dr. Wilburn Cornelia (ENT). Also inquired if Dr. Isidore Moos will be following this patient and will she manage his nutrition?

## 2019-09-24 NOTE — Progress Notes (Signed)
Oncology Nurse Navigator Documentation  I received notice from Dr. Gearldine Shown office yesterday regarding a request for a follow up appointment for Mr. Johnsey with Dr. Wilburn Cornelia ENT. I sent a fax to Dr. Danie Binder office requesting a post hospital follow up for Mr. Pringle. Notification of successful fax transmission received.   Harlow Asa RN, BSN, OCN Head & Neck Oncology Nurse [place] at [facility] Phone # [phone]  Fax # [phone]

## 2019-09-24 NOTE — Telephone Encounter (Signed)
Scheduled appointment per 7/22 message. Patient's number was not in service so I called Patient's sister, Devon Peterson who is listed first under patient's contacts. Patient's sister is aware of appointment date and time and will be setting up transportation for the patient to appointment.

## 2019-09-25 NOTE — Progress Notes (Signed)
Oncology Nurse Navigator Documentation  I spoke with Mr. Vannice' sister Rector Devonshire. I informed her of his appointment that I have scheduled with [facility] ENT on 10/20/19 at 1:30. I provided her with the address of the office as well. She voiced her understanding of the appointment.  Harlow Asa RN, BSN, OCN Head & Neck Oncology Nurse [place] at [facility] Phone # [phone]  Fax # [phone]

## 2019-09-28 ENCOUNTER — Ambulatory Visit (HOSPITAL_COMMUNITY)
Admission: RE | Admit: 2019-09-28 | Discharge: 2019-09-28 | Disposition: A | Discharge status: Home / Self Care | Payer: Medicaid Other | Source: Ambulatory Visit | Attending: Internal Medicine | Admitting: Internal Medicine

## 2019-09-28 ENCOUNTER — Other Ambulatory Visit: Payer: Self-pay

## 2019-09-28 DIAGNOSIS — R1310 Dysphagia, unspecified: Secondary | ICD-10-CM | POA: Insufficient documentation

## 2019-10-04 DIAGNOSIS — C4442 Squamous cell carcinoma of skin of scalp and neck: Secondary | ICD-10-CM | POA: Diagnosis not present

## 2019-10-04 DIAGNOSIS — J449 Chronic obstructive pulmonary disease, unspecified: Secondary | ICD-10-CM | POA: Diagnosis not present

## 2019-10-04 DIAGNOSIS — Z931 Gastrostomy status: Secondary | ICD-10-CM | POA: Diagnosis not present

## 2019-10-04 DIAGNOSIS — E43 Unspecified severe protein-calorie malnutrition: Secondary | ICD-10-CM | POA: Diagnosis not present

## 2019-10-04 DIAGNOSIS — F339 Major depressive disorder, recurrent, unspecified: Secondary | ICD-10-CM | POA: Diagnosis not present

## 2019-10-04 DIAGNOSIS — D509 Iron deficiency anemia, unspecified: Secondary | ICD-10-CM | POA: Diagnosis not present

## 2019-10-04 DIAGNOSIS — I480 Paroxysmal atrial fibrillation: Secondary | ICD-10-CM | POA: Diagnosis not present

## 2019-10-05 ENCOUNTER — Encounter: Payer: Self-pay | Admitting: Nutrition

## 2019-10-05 ENCOUNTER — Inpatient Hospital Stay: Payer: Medicaid Other | Attending: Oncology | Admitting: Nutrition

## 2019-10-05 DIAGNOSIS — R197 Diarrhea, unspecified: Secondary | ICD-10-CM | POA: Insufficient documentation

## 2019-10-05 DIAGNOSIS — I4891 Unspecified atrial fibrillation: Secondary | ICD-10-CM | POA: Insufficient documentation

## 2019-10-05 DIAGNOSIS — C329 Malignant neoplasm of larynx, unspecified: Secondary | ICD-10-CM | POA: Insufficient documentation

## 2019-10-05 DIAGNOSIS — J449 Chronic obstructive pulmonary disease, unspecified: Secondary | ICD-10-CM | POA: Insufficient documentation

## 2019-10-05 DIAGNOSIS — F1010 Alcohol abuse, uncomplicated: Secondary | ICD-10-CM | POA: Insufficient documentation

## 2019-10-05 DIAGNOSIS — D649 Anemia, unspecified: Secondary | ICD-10-CM | POA: Insufficient documentation

## 2019-10-05 LAB — ACID FAST CULTURE WITH REFLEXED SENSITIVITIES (MYCOBACTERIA): Acid Fast Culture: NEGATIVE

## 2019-10-05 NOTE — Progress Notes (Signed)
Patient did not show up for scheduled nutrition appointment.

## 2019-10-06 ENCOUNTER — Ambulatory Visit
Admission: RE | Admit: 2019-10-06 | Discharge: 2019-10-06 | Disposition: A | Discharge status: Home / Self Care | Payer: Medicaid Other | Source: Ambulatory Visit | Attending: Radiation Oncology | Admitting: Radiation Oncology

## 2019-10-06 ENCOUNTER — Other Ambulatory Visit: Payer: Self-pay

## 2019-10-06 VITALS — BP 96/70 | HR 82 | Temp 98.2°F | Resp 18 | Ht 71.0 in

## 2019-10-06 DIAGNOSIS — R1310 Dysphagia, unspecified: Secondary | ICD-10-CM | POA: Insufficient documentation

## 2019-10-06 DIAGNOSIS — B370 Candidal stomatitis: Secondary | ICD-10-CM | POA: Insufficient documentation

## 2019-10-06 DIAGNOSIS — Z923 Personal history of irradiation: Secondary | ICD-10-CM | POA: Diagnosis not present

## 2019-10-06 DIAGNOSIS — C131 Malignant neoplasm of aryepiglottic fold, hypopharyngeal aspect: Secondary | ICD-10-CM | POA: Diagnosis not present

## 2019-10-06 DIAGNOSIS — C139 Malignant neoplasm of hypopharynx, unspecified: Secondary | ICD-10-CM

## 2019-10-06 DIAGNOSIS — Z79899 Other long term (current) drug therapy: Secondary | ICD-10-CM | POA: Diagnosis not present

## 2019-10-06 DIAGNOSIS — C4442 Squamous cell carcinoma of skin of scalp and neck: Secondary | ICD-10-CM

## 2019-10-06 MED ORDER — FLUCONAZOLE 10 MG/ML PO SUSR
100.0000 mg | Freq: Every day | ORAL | 0 refills | Status: DC
Start: 2019-10-06 — End: 2019-11-03

## 2019-10-06 NOTE — Progress Notes (Signed)
Oncology Nurse Navigator Documentation  I met with Devon Peterson briefly before his follow up visit with Dr. Isidore Moos today. He is currently residing at [facility] facility. He tells me that he is feeling well. He is receiving continuous tube feedings via his PEG every night from 7pm- 7am. He is also eating meals throughout the day following quidelines recommends by SLP. He is scheduled to see ENT on 8/17, and was aware of that appointment. He will see Dr. Benay Spice next on 11/03/19 and Dr. Isidore Moos on 02/12/20.  Harlow Asa RN, BSN, OCN Head & Neck Oncology Nurse [place] at [facility] Phone # [phone]  Fax # [phone]

## 2019-10-06 NOTE — Progress Notes (Signed)
Devon Peterson presents for follow up of radiation to his larynx completed on 08/21/2019  Pain issues, if any: Patient denies Using a feeding tube?: Yes--staff at SNF help with feedings Weight changes, if any:  Wt Readings from Last 3 Encounters:  09/22/19 97 lb 14.4 oz (44.4 kg)  09/14/19 106 lb 6.4 oz (48.3 kg)  08/22/19 94 lb 12.8 oz (43 kg)   Swallowing issues, if any: Patient reports he is trying to eat and drink more orally. Had a modified barium swallow study on 09/28/2019: In an upright position, he continued to demonstrate decreased laryngeal vestibule closure during the swallow of most consistencies, with poor arytenoid movment to approximate the base of the epiglottis, and intermittently incomplete epiglottic inversion.  There was aspiration of all materials (thin, nectar, purees) before, during, then after the swallow due to ongoing spilling of residue into an open vestibule.  Pt demonstrated reliable sensation, coughing when aspirating and often being able to eject most of the material out of his larynx; however, material tended to descend back into open larynx afterwards.  Chin tuck, head turns and tilts were attempted with marginal improvement noted with chin tuck and double swallow. When reclined to a position of 40 degrees and offered multiple boluses of thin liquids, purees, and both consistencies simultaneously, pt DID NOT ASPIRATE ANY CONSISTENCY over multiple trials.  Material flowed down posterior pharynx and through UES without entering the laryngeal vestibule.  Devon Peterson viewed video in real time.  We discussed results/recommendations; he was pleased with the outcome and realized that absence of coughing meant he was protecting his airway adequately.  Recommend beginning to allow purees/finely chopped POs at 40 degrees for all PO intake (chair recliner or bed).  Pt should ensure all residue is clear before he sits upright. Continue SLP tx at SNF for dysphagia. Smoking or chewing  tobacco? None Using fluoride trays daily? N/A Last ENT visit was on: Not since hospitalization earlier this year Other notable issues, if any: Patient denies any issues or concerns.  Vitals:   10/06/19 1053  BP: 96/70  Pulse: 82  Resp: 18  Temp: 98.2 F (36.8 C)  SpO2: 100%

## 2019-10-08 ENCOUNTER — Other Ambulatory Visit (HOSPITAL_COMMUNITY): Payer: Self-pay | Admitting: Internal Medicine

## 2019-10-08 DIAGNOSIS — K9423 Gastrostomy malfunction: Secondary | ICD-10-CM

## 2019-10-09 ENCOUNTER — Ambulatory Visit (HOSPITAL_COMMUNITY)
Admission: RE | Admit: 2019-10-09 | Discharge: 2019-10-09 | Disposition: A | Discharge status: Home / Self Care | Payer: Medicaid Other | Source: Ambulatory Visit | Attending: Internal Medicine | Admitting: Internal Medicine

## 2019-10-09 ENCOUNTER — Other Ambulatory Visit: Payer: Self-pay

## 2019-10-09 ENCOUNTER — Encounter: Payer: Self-pay | Admitting: Radiation Oncology

## 2019-10-09 DIAGNOSIS — K9423 Gastrostomy malfunction: Secondary | ICD-10-CM

## 2019-10-09 HISTORY — PX: IR REPLACE G-TUBE SIMPLE WO FLUORO: IMG2323

## 2019-10-09 MED ORDER — LIDOCAINE VISCOUS HCL 2 % MT SOLN
OROMUCOSAL | Status: AC
  Filled 2019-10-09: qty 15

## 2019-10-09 MED ORDER — LIDOCAINE VISCOUS HCL 2 % MT SOLN
OROMUCOSAL | Status: DC | PRN
  Administered 2019-10-09: 15 mL via OROMUCOSAL

## 2019-10-09 MED ORDER — IOHEXOL 300 MG/ML  SOLN
50.0000 mL | Freq: Once | INTRAMUSCULAR | Status: AC | PRN
  Administered 2019-10-09: 10 mL

## 2019-10-09 NOTE — Progress Notes (Signed)
Radiation Oncology         [phone] ________________________________  Name: Devon Peterson MRN: [id number]  Date: 10/06/2019  DOB: 11-07-1955  Follow-Up Visit Note  CC: Patient, No Pcp Per  No ref. provider found  Diagnosis and Prior Radiotherapy:       ICD-10-CM   1. 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   Devon Peterson presents for follow up of radiation to his larynx completed on 08/21/2019  Pain issues, if any: Patient denies Using a feeding tube?: Yes--staff at SNF help with feedings Weight changes, if any:  Wt Readings from Last 3 Encounters:  09/22/19 97 lb 14.4 oz (44.4 kg)  09/14/19 106 lb 6.4 oz (48.3 kg)  08/22/19 94 lb 12.8 oz (43 kg)   Swallowing issues, if any: Patient reports he is trying to eat and drink more orally. Had a modified barium swallow study on 09/28/2019. Will continue SLP tx at SNF for dysphagia. Smoking or chewing tobacco? None Using fluoride trays daily? N/A Last ENT visit was on: Not since hospitalization earlier this year Other notable issues, if any: Patient denies any issues or concerns.                       ALLERGIES:  is allergic to albumin (human).  Meds: Current Outpatient Medications  Medication Sig Dispense Refill  . Amino Acids-Protein Hydrolys (FEEDING SUPPLEMENT, PRO-STAT SUGAR FREE 64,) LIQD Place 30 mLs into feeding tube 2 (two) times daily. 887 mL 0  . chlorhexidine gluconate, MEDLINE KIT, (PERIDEX) 0.12 % solution 15 mLs by Mouth Rinse route 2 (two) times daily. 120 mL 0  . ferrous sulfate 325 (65 FE) MG tablet Take 1 tablet  (325 mg total) by mouth daily.    . Nutritional Supplements (ISOSOURCE 1.5 CAL) LIQD Place 237 mLs into feeding tube 5 (five) times daily.  0  . nystatin (MYCOSTATIN) 100000 UNIT/ML suspension Take 30 mLs by mouth daily.    Marland Kitchen oxycodone (OXY-IR) 5 MG capsule Take 5 mg by mouth every 8 (eight) hours as needed.    . pantoprazole (PROTONIX) 40 MG tablet Take 1 tablet (40 mg total) by mouth daily.    . polyethylene glycol (MIRALAX / GLYCOLAX) 17 g packet Take 17 g by mouth daily. 14 each 0  . senna (SENOKOT) 8.6 MG TABS tablet Take 2 tablets (17.2 mg total) by mouth daily. 120 tablet 0  . sertraline (ZOLOFT) 50 MG tablet Take 1 tablet (50 mg total) by mouth daily.    Marland Kitchen thiamine 100 MG tablet Take 1 tablet (100 mg total) by mouth daily.    . fluconazole (DIFLUCAN) 10 MG/ML suspension Take 10 mLs (100 mg total) by mouth daily. On day 1, take 46m. Days 2-14, take 146m 150 mL 0  . metoprolol tartrate (LOPRESSOR) 25 MG tablet Take 0.5 tablets (12.5 mg total) by mouth 2 (two) times daily.     No current facility-administered medications for this encounter.    Physical Findings: The patient is in no acute distress. Patient is alert and oriented. Wt Readings from Last 3  Encounters:  09/22/19 97 lb 14.4 oz (44.4 kg)  09/14/19 106 lb 6.4 oz (48.3 kg)  08/22/19 94 lb 12.8 oz (43 kg)    height is _0  (1.803 m). His temperature is 98.2 F (36.8 C). His blood pressure is 96/70 and his pulse is 82. His respiration is 18 and oxygen saturation is 100%. .  General: Alert and oriented, in no acute distress, in WC HEENT: Head is normocephalic. Extraocular movements are intact. Oropharynx is notable for no lesions in upper throat, does have oral thrush. Neck: Neck is notable for no palpable adenopathy. Skin: Skin in treatment fields shows satisfactory healing   Lab Findings: Lab Results  Component Value Date   WBC 3.5 (L) 08/25/2019   HGB 10.2 (L) 08/25/2019   HCT 31.4 (L) 08/25/2019   MCV 85.8  08/25/2019   PLT 249 08/25/2019    Lab Results  Component Value Date   TSH 2.015 04/18/2019    Radiographic Findings: DG SWALLOW FUNC SPEECH PATH  Result Date: 09/28/2019 Objective Swallowing Evaluation: Type of Study: MBS-Modified Barium Swallow Study  Patient Details Name: Devon Peterson MRN: [id number] Date of Birth: 08-01-1955 Today's Date: 09/28/2019 Time: SLP Start Time (ACUTE ONLY): 1155 -SLP Stop Time (ACUTE ONLY): 1230 SLP Time Calculation (min) (ACUTE ONLY): 35 min Past Medical History: Past Medical History: Diagnosis Date . COPD (chronic obstructive pulmonary disease) ([place])  . Medical history non-contributory  Past Surgical History: Past Surgical History: Procedure Laterality Date . BIOPSY  08/20/2019  Procedure: BIOPSY;  Surgeon: Chesley Mires, MD;  Location: [facility];  Service: Endoscopy;; . BRONCHIAL WASHINGS  08/20/2019  Procedure: BRONCHIAL WASHINGS;  Surgeon: Chesley Mires, MD;  Location: [facility];  Service: Endoscopy;;  LLL BAL . DIRECT LARYNGOSCOPY N/A 07/09/2019  Procedure: DIRECT LARYNGOSCOPY WITH BIOPSY;  Surgeon: Jerrell Belfast, MD;  Location: [facility];  Service: ENT;  Laterality: N/A; . IR FLUORO RM 30-60 MIN  07/13/2019 . IR GASTROSTOMY TUBE MOD SED  07/14/2019 . NO PAST SURGERIES   . TRACHEOSTOMY TUBE PLACEMENT N/A 07/09/2019  Procedure: TRACHEOSTOMY;  Surgeon: Jerrell Belfast, MD;  Location: [facility];  Service: ENT;  Laterality: N/A; . VIDEO BRONCHOSCOPY Left 08/20/2019  Procedure: VIDEO BRONCHOSCOPY WITH FLUORO;  Surgeon: Chesley Mires, MD;  Location: [facility];  Service: Endoscopy;  Laterality: Left; HPI: 64 y.o. male with hx of laryngeal cancer referred for repeat MBS as OP.  Hx includes 07/08/19: CT neck hypopharyngeal soft tissue suspicious for malignancy; 07/09/19-6/23 trach; 5/20-6/18: radiation to larynx; hx aspiration pna, afib, COPD, alcohol and cocaine abuse.  Pt underwent several MBS studies during hospital admission.  Last MBS on 6/21 revealed trace aspiration of thin liquids;  conservative liquid diet recommended.  Primary nutrition is via PEG.  Subjective: alert Assessment / Plan / Recommendation CHL IP CLINICAL IMPRESSIONS 09/28/2019 Clinical Impression Pt presents with a chronic dysphagia s/p radiation tx to head/neck.  In an upright position, he continued to demonstrate decreased laryngeal vestibule closure during the swallow of most consistencies, with poor arytenoid movment to approximate the base of the epiglottis, and intermittently incomplete epiglottic inversion.  There was aspiration of all materials (thin, nectar, purees) before, during, then after the swallow due to ongoing spilling of residue into an open vestibule.  Pt demonstrated reliable sensation, coughing when aspirating and often being able to eject most of the material out of his larynx; however, material tended to descend back into open larynx afterwards.  Chin tuck, head turns and tilts were attempted with marginal improvement noted with chin  tuck and double swallow. When reclined to a position of 40 degrees and offered multiple boluses of thin liquids, purees, and both consistencies simultaneously, pt DID NOT ASPIRATE ANY CONSISTENCY over multiple trials.  Material flowed down posterior pharynx and through UES without entering the laryngeal vestibule.  Devon Peterson viewed video in real time.  We discussed results/recommendations; he was pleased with the outcome and realized that absence of coughing meant he was protecting his airway adequately.  Recommend beginning to allow purees/finely chopped POs at 40 degrees for all PO intake (chair recliner or bed).  Pt should ensure all residue is clear before he sits upright. Continue SLP tx at SNF for dysphagia. SLP Visit Diagnosis Dysphagia, oropharyngeal phase (R13.12) Attention and concentration deficit following -- Frontal lobe and executive function deficit following -- Impact on safety and function Moderate aspiration risk   CHL IP TREATMENT RECOMMENDATION 09/28/2019  Treatment Recommendations Defer treatment plan to f/u with SLP   Prognosis 08/24/2019 Prognosis for Safe Diet Advancement Good Barriers to Reach Goals Behavior Barriers/Prognosis Comment -- CHL IP DIET RECOMMENDATION 09/28/2019 SLP Diet Recommendations Dysphagia 1 (Puree) solids;Dysphagia 2 (Fine chop) solids;Thin liquid Liquid Administration via Cup;Straw Medication Administration Via alternative means Compensations -- Postural Changes Other (Comment)   CHL IP OTHER RECOMMENDATIONS 08/24/2019 Recommended Consults -- Oral Care Recommendations Oral care QID Other Recommendations --   CHL IP FOLLOW UP RECOMMENDATIONS 09/28/2019 Follow up Recommendations Skilled Nursing facility   [facility] IP FREQUENCY AND DURATION 08/24/2019 Speech Therapy Frequency (ACUTE ONLY) min 2x/week Treatment Duration 2 weeks      CHL IP ORAL PHASE 09/28/2019 Oral Phase Impaired Oral - Pudding Teaspoon -- Oral - Pudding Cup -- Oral - Honey Teaspoon -- Oral - Honey Cup -- Oral - Nectar Teaspoon -- Oral - Nectar Cup Lingual pumping Oral - Nectar Straw -- Oral - Thin Teaspoon -- Oral - Thin Cup -- Oral - Thin Straw Lingual pumping Oral - Puree Lingual pumping Oral - Mech Soft -- Oral - Regular -- Oral - Multi-Consistency -- Oral - Pill -- Oral Phase - Comment --  CHL IP PHARYNGEAL PHASE 09/28/2019 Pharyngeal Phase Impaired Pharyngeal- Pudding Teaspoon -- Pharyngeal -- Pharyngeal- Pudding Cup -- Pharyngeal -- Pharyngeal- Honey Teaspoon -- Pharyngeal -- Pharyngeal- Honey Cup -- Pharyngeal -- Pharyngeal- Nectar Teaspoon -- Pharyngeal -- Pharyngeal- Nectar Cup Delayed swallow initiation-pyriform sinuses;Reduced anterior laryngeal mobility;Reduced laryngeal elevation;Reduced epiglottic inversion;Reduced airway/laryngeal closure;Penetration/Aspiration before swallow;Penetration/Aspiration during swallow;Penetration/Apiration after swallow;Moderate aspiration;Pharyngeal residue - valleculae;Pharyngeal residue - pyriform Pharyngeal Material enters airway, passes  BELOW cords and not ejected out despite cough attempt by patient Pharyngeal- Nectar Straw -- Pharyngeal -- Pharyngeal- Thin Teaspoon -- Pharyngeal -- Pharyngeal- Thin Cup -- Pharyngeal -- Pharyngeal- Thin Straw Delayed swallow initiation-pyriform sinuses;Reduced anterior laryngeal mobility;Reduced laryngeal elevation;Reduced epiglottic inversion;Reduced airway/laryngeal closure;Penetration/Aspiration before swallow;Penetration/Aspiration during swallow;Penetration/Apiration after swallow;Moderate aspiration;Pharyngeal residue - valleculae;Pharyngeal residue - pyriform Pharyngeal Material enters airway, passes BELOW cords and not ejected out despite cough attempt by patient Pharyngeal- Puree Delayed swallow initiation-pyriform sinuses;Reduced anterior laryngeal mobility;Reduced laryngeal elevation;Reduced epiglottic inversion;Reduced airway/laryngeal closure;Penetration/Aspiration before swallow;Penetration/Aspiration during swallow;Penetration/Apiration after swallow;Pharyngeal residue - valleculae;Pharyngeal residue - pyriform;Trace aspiration Pharyngeal Material enters airway, passes BELOW cords and not ejected out despite cough attempt by patient Pharyngeal- Mechanical Soft -- Pharyngeal -- Pharyngeal- Regular -- Pharyngeal -- Pharyngeal- Multi-consistency -- Pharyngeal -- Pharyngeal- Pill -- Pharyngeal -- Pharyngeal Comment --  CHL IP CERVICAL ESOPHAGEAL PHASE 09/28/2019 Cervical Esophageal Phase Impaired Pudding Teaspoon -- Pudding Cup -- Honey Teaspoon -- Honey Cup -- Nectar Teaspoon -- Nectar Cup --  Nectar Straw -- Thin Teaspoon -- Thin Cup -- Thin Straw -- Puree -- Mechanical Soft -- Regular -- Multi-consistency -- Pill -- Cervical Esophageal Comment -- Devon Peterson 09/28/2019, 2:16 PM               Impression/Plan:    1) Head and Neck Cancer Status: Healing from radiotherapy. Thrush present in mouth despite nystatin -see #6  2) Nutritional Status: Stabilizing, continuing tube feeds and oral  intake as tolerated PEG tube: Skilled nursing facility staff help some use this  3) Risk Factors: The patient has been educated about risk factors including alcohol and tobacco abuse; they understand that avoidance of alcohol and tobacco is important to prevent recurrences as well as other cancers  4) Swallowing: Continue speech-language pathology exercises at skilled nursing facility for dysphagia  5)  Thyroid function: Check annually Lab Results  Component Value Date   TSH 2.015 04/18/2019    6) Other: For his thrush, I prescribed fluconazole and wrote a note to the skilled nursing facility to administer this  7) We discussed measures to reduce the risk of infection during the COVID-19 pandemic. He has not been vaccinated. I recommended he get vaccinated. We discussed the risks of the virus and the small risks of the vaccine. He would like to receive the vaccine. [facility] called the skilled nursing facility today and they are conducting vaccinations this week in their center. They will add him to their list.  8) Follow-up in 4 months. The patient was encouraged to call with any issues or questions before then. He has follow-up in the interim with medical oncology and otolaryngology and also restaging scans.  On date of service, in total, I spent 25 minutes on this encounter. Patient was seen in person. _____________________________________   Eppie Gibson, MD

## 2019-10-19 NOTE — Progress Notes (Signed)
  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    Plan: The patient will follow-up with radiation oncology in August, following medical oncology.  -----------------------------------  Eppie Gibson, MD

## 2019-10-20 DIAGNOSIS — C139 Malignant neoplasm of hypopharynx, unspecified: Secondary | ICD-10-CM | POA: Diagnosis not present

## 2019-10-21 DIAGNOSIS — J392 Other diseases of pharynx: Secondary | ICD-10-CM | POA: Diagnosis not present

## 2019-10-21 DIAGNOSIS — I4892 Unspecified atrial flutter: Secondary | ICD-10-CM | POA: Diagnosis not present

## 2019-10-21 DIAGNOSIS — R918 Other nonspecific abnormal finding of lung field: Secondary | ICD-10-CM | POA: Diagnosis not present

## 2019-10-21 DIAGNOSIS — C139 Malignant neoplasm of hypopharynx, unspecified: Secondary | ICD-10-CM | POA: Diagnosis not present

## 2019-10-23 ENCOUNTER — Other Ambulatory Visit: Payer: Self-pay | Admitting: *Deleted

## 2019-10-23 DIAGNOSIS — C139 Malignant neoplasm of hypopharynx, unspecified: Secondary | ICD-10-CM

## 2019-10-23 NOTE — Progress Notes (Signed)
Notification by radiology that patient needs labs prior to CT scan on 11/02/19. Order for BMP placed and scheduling message sent.

## 2019-10-26 ENCOUNTER — Telehealth: Payer: Self-pay | Admitting: *Deleted

## 2019-10-26 NOTE — Telephone Encounter (Signed)
Sister call to ask why he was sent to ENT for referral. Nothing was done at the appointment. Informed her that Dr. Benay Spice wanted him established w/ENT, since the ENT is the main MD going to follow him for surveillance to assess for recurrence. Informed her that the exam was negative. Reviewed his appointments for 8/30 and 8/31--copy of calendar was faxed to [facility]. This RN has call facility twice and never able to speak w/transportation coordinator. His sister will f/u with facility.

## 2019-11-02 ENCOUNTER — Inpatient Hospital Stay: Payer: Medicaid Other

## 2019-11-02 ENCOUNTER — Encounter (HOSPITAL_COMMUNITY): Payer: Self-pay

## 2019-11-02 ENCOUNTER — Other Ambulatory Visit: Payer: Self-pay

## 2019-11-02 ENCOUNTER — Ambulatory Visit (HOSPITAL_COMMUNITY)
Admission: RE | Admit: 2019-11-02 | Discharge: 2019-11-02 | Disposition: A | Discharge status: Home / Self Care | Payer: Medicaid Other | Source: Ambulatory Visit | Attending: Oncology | Admitting: Oncology

## 2019-11-02 DIAGNOSIS — M50321 Other cervical disc degeneration at C4-C5 level: Secondary | ICD-10-CM | POA: Diagnosis not present

## 2019-11-02 DIAGNOSIS — R197 Diarrhea, unspecified: Secondary | ICD-10-CM | POA: Diagnosis not present

## 2019-11-02 DIAGNOSIS — J392 Other diseases of pharynx: Secondary | ICD-10-CM | POA: Diagnosis not present

## 2019-11-02 DIAGNOSIS — C139 Malignant neoplasm of hypopharynx, unspecified: Secondary | ICD-10-CM | POA: Insufficient documentation

## 2019-11-02 DIAGNOSIS — F1010 Alcohol abuse, uncomplicated: Secondary | ICD-10-CM | POA: Diagnosis not present

## 2019-11-02 DIAGNOSIS — J0100 Acute maxillary sinusitis, unspecified: Secondary | ICD-10-CM | POA: Diagnosis not present

## 2019-11-02 DIAGNOSIS — J387 Other diseases of larynx: Secondary | ICD-10-CM | POA: Diagnosis not present

## 2019-11-02 DIAGNOSIS — I4891 Unspecified atrial fibrillation: Secondary | ICD-10-CM | POA: Diagnosis not present

## 2019-11-02 DIAGNOSIS — C329 Malignant neoplasm of larynx, unspecified: Secondary | ICD-10-CM | POA: Diagnosis not present

## 2019-11-02 DIAGNOSIS — D649 Anemia, unspecified: Secondary | ICD-10-CM | POA: Diagnosis not present

## 2019-11-02 DIAGNOSIS — J449 Chronic obstructive pulmonary disease, unspecified: Secondary | ICD-10-CM | POA: Diagnosis not present

## 2019-11-02 LAB — BASIC METABOLIC PANEL - CANCER CENTER ONLY
Anion gap: 8 (ref 5–15)
BUN: 20 mg/dL (ref 8–23)
CO2: 30 mmol/L (ref 22–32)
Calcium: 11.1 mg/dL — ABNORMAL HIGH (ref 8.9–10.3)
Chloride: 97 mmol/L — ABNORMAL LOW (ref 98–111)
Creatinine: 0.72 mg/dL (ref 0.61–1.24)
GFR, Est AFR Am: >60 mL/min
GFR, Estimated: >60 mL/min
Glucose, Bld: 96 mg/dL (ref 70–99)
Potassium: 4.5 mmol/L (ref 3.5–5.1)
Sodium: 135 mmol/L (ref 135–145)

## 2019-11-02 MED ORDER — IOHEXOL 300 MG/ML  SOLN
75.0000 mL | Freq: Once | INTRAMUSCULAR | Status: AC | PRN
  Administered 2019-11-02: 75 mL via INTRAVENOUS

## 2019-11-02 MED ORDER — SODIUM CHLORIDE (PF) 0.9 % IJ SOLN
INTRAMUSCULAR | Status: AC
  Filled 2019-11-02: qty 50

## 2019-11-03 ENCOUNTER — Inpatient Hospital Stay: Payer: Medicaid Other

## 2019-11-03 ENCOUNTER — Telehealth: Payer: Self-pay | Admitting: Oncology

## 2019-11-03 ENCOUNTER — Other Ambulatory Visit: Payer: Self-pay

## 2019-11-03 ENCOUNTER — Inpatient Hospital Stay (HOSPITAL_BASED_OUTPATIENT_CLINIC_OR_DEPARTMENT_OTHER): Payer: Medicaid Other | Admitting: Oncology

## 2019-11-03 VITALS — BP 102/56 | HR 98 | Temp 98.3°F | Resp 18 | Ht 71.0 in | Wt 109.7 lb

## 2019-11-03 DIAGNOSIS — C329 Malignant neoplasm of larynx, unspecified: Secondary | ICD-10-CM | POA: Diagnosis not present

## 2019-11-03 DIAGNOSIS — C139 Malignant neoplasm of hypopharynx, unspecified: Secondary | ICD-10-CM

## 2019-11-03 LAB — BASIC METABOLIC PANEL - CANCER CENTER ONLY
Anion gap: 11 (ref 5–15)
BUN: 24 mg/dL — ABNORMAL HIGH (ref 8–23)
CO2: 29 mmol/L (ref 22–32)
Calcium: 11.1 mg/dL — ABNORMAL HIGH (ref 8.9–10.3)
Chloride: 98 mmol/L (ref 98–111)
Creatinine: 0.79 mg/dL (ref 0.61–1.24)
GFR, Est AFR Am: >60 mL/min (ref >60)
GFR, Estimated: >60 mL/min (ref >60)
Glucose, Bld: 86 mg/dL (ref 70–99)
Potassium: 4.5 mmol/L (ref 3.5–5.1)
Sodium: 138 mmol/L (ref 135–145)

## 2019-11-03 NOTE — Progress Notes (Signed)
Devon Peterson OFFICE PROGRESS NOTE   Diagnosis: Larynx cancer, left lung mass  INTERVAL HISTORY:   Devon Peterson returns for a scheduled visit.  He continues to reside in the skilled nursing facility.  He feels well.  Throat pain is improving.  He had an episode of diarrhea today.  He plans to return home.  Objective:  Vital signs in last 24 hours:  Blood pressure (!) 77/60, pulse 98, temperature 98.3 F (36.8 C), temperature source Tympanic, resp. rate 18, height 5\' 11"  (1.803 m), weight 109 lb 11.2 oz (49.8 kg), SpO2 99 %.    HEENT: Neck without mass Lymphatics: No cervical or supraclavicular nodes.  "Shotty "axillary nodes. Resp: Lungs clear bilaterally Cardio: Regular rate and rhythm GI: No hepatomegaly, no mass, left upper quadrant feeding tube site with granulation tissue, the dressing is soiled with dark brown material Vascular: No leg edema       Lab Results:  Lab Results  Component Value Date   WBC 3.5 (L) 08/25/2019   HGB 10.2 (L) 08/25/2019   HCT 31.4 (L) 08/25/2019   MCV 85.8 08/25/2019   PLT 249 08/25/2019   NEUTROABS 3.0 08/12/2019    CMP  Lab Results  Component Value Date   NA 135 11/02/2019   K 4.5 11/02/2019   CL 97 (L) 11/02/2019   CO2 30 11/02/2019   GLUCOSE 96 11/02/2019   BUN 20 11/02/2019   CREATININE 0.72 11/02/2019   CALCIUM 11.1 (H) 11/02/2019   PROT 6.6 08/23/2019   ALBUMIN 3.2 (L) 08/23/2019   AST 17 08/23/2019   ALT 13 08/23/2019   ALKPHOS 60 08/23/2019   BILITOT 0.3 08/23/2019   GFRNONAA >60 11/02/2019   GFRAA >60 11/02/2019     Imaging:  CT SOFT TISSUE NECK W CONTRAST  Result Date: 11/03/2019 CLINICAL DATA:  Restaging laryngeal cancer. EXAM: CT NECK WITH CONTRAST TECHNIQUE: Multidetector CT imaging of the neck was performed using the standard protocol following the bolus administration of intravenous contrast. CONTRAST:  62mL OMNIPAQUE IOHEXOL 300 MG/ML  SOLN COMPARISON:  07/08/2019 FINDINGS: Pharynx and larynx:  Mild diffuse low-density edema in the pharynx and larynx is consistent with interval radiation therapy. There is asymmetric low-density thickening of the right aryepiglottic fold. The overall volume of soft tissue in the hypopharyngeal region has greatly diminished, and no residual measurable hyperenhancing tumor is identified. The airway is patent. Salivary glands: Post radiation changes including hyperenhancement of the submandibular glands. No salivary stone. Thyroid: Unremarkable. Lymph nodes: No enlarged or suspicious lymph nodes in the neck. Vascular: Major vascular structures of the neck are patent. Prominent calcified and soft plaque in the proximal right ICA likely results in at least 60% stenosis. Limited intracranial: Unremarkable. Visualized orbits: Unremarkable. Mastoids and visualized paranasal sinuses: Minimal secretions in the maxillary sinuses. Clear mastoid air cells. Skeleton: Edentulous. Stable degenerative endplate changes at O1-3. Prominent median C1-2 arthropathy. No suspicious osseous lesion. Upper chest: Reported separately. Other: None. IMPRESSION: 1. Post radiation changes without evidence of residual enhancing primary tumor or cervical lymphadenopathy. 2. Suspected at least 60% proximal right ICA stenosis. Electronically Signed   By: Logan Bores M.D.   On: 11/03/2019 08:52   CT CHEST W CONTRAST  Result Date: 11/02/2019 CLINICAL DATA:  Restaging metastatic laryngeal cancer EXAM: CT CHEST WITH CONTRAST TECHNIQUE: Multidetector CT imaging of the chest was performed during intravenous contrast administration. CONTRAST:  8mL OMNIPAQUE IOHEXOL 300 MG/ML  SOLN COMPARISON:  09/16/2019 FINDINGS: Cardiovascular: Left coronary artery calcifications. Normal heart size.  No pericardial effusion. Mediastinum/Nodes: No enlarged mediastinal, hilar, or axillary lymph nodes. Thyroid gland, trachea, and esophagus demonstrate no significant findings. Lungs/Pleura: Moderate centrilobular emphysema.  Improved aeration of the left lower lobe. Redemonstrated hypodense mass of the dependent left lower lobe measuring 3.7 x 3.2 cm, increased in size compared to prior examination, measuring 2.8 x 2.1 cm at that time (series 2, image 116). No pleural effusion or pneumothorax. Upper Abdomen: No acute abnormality.  Percutaneous gastrostomy. Musculoskeletal: No chest wall mass or suspicious bone lesions identified. IMPRESSION: 1. Redemonstrated hypodense mass of the dependent left lower lobe measuring 3.7 x 3.2 cm, increased in size compared to prior examination, measuring 2.8 x 2.1 cm at that time. Findings are worrisome for enlarging primary lung malignancy malignancy or metastasis, although pulmonary abscess could have this appearance. Consider PET-CT for metabolic characterization and tissue sampling. 2. Improved aeration of the left lower lobe. 3. Emphysema (ICD10-J43.9). 4. Coronary artery disease. Electronically Signed   By: Eddie Candle M.D.   On: 11/02/2019 15:55   CT images reviewed with Devon Peterson Medications: I have reviewed the patient's current medications.   Assessment/Plan:  1. 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 Left lower lobe of the lung mass concerning for primary neoplasm versus metastatic disease -07/07/2019 CT angiogram of the chest 2.7 cm mass in the left lower lobe of the lung             -CT 08/17/2019-persistent low-attenuation lesion in the superior segment of left lower lobe with surrounding consolidation/collapse,  stable from      07/07/2019, consolidation and groundglass opacity in the left lower lobe have largely resolved             -08/20/2019 bronchoscopy-biopsy nondiagnostic             -CT chest 11/02/2019-, increased in size concerning for a primary lung malignancy versus a metastasis versus pulmonary abscess, improved left lower lobe aeration, COPD 3. Cachexia 4. History of aspiration pneumonia 5. Mild anemia 6. Atrial fibrillation with RVR 6. COPD 8. Alcohol abuse 9. Tobacco dependence 10. Cocaine abuse 11.  Odynophagia secondary to radiation-improved     Disposition: Devon Peterson appears well.  The restaging neck scan reveals no evidence of residual tumor.  He appears to be in clinical remission from the head and neck cancer.  The left lower lung density has increased in size over the past few months.  The lesion is hypodense and could reflect a fluid collection.  He does not have symptoms to suggest an abscess.  A bronchoscopic biopsy was nondiagnostic in June.  I will present his case at the thoracic tumor conference to see whether he may be a candidate for a percutaneous biopsy.  I instructed him to hold MiraLAX for diarrhea.  He will return for an office visit in 1 month.  I recommended he obtain the COVID-19 vaccine via the skilled nursing facility.  He may be able to come off of the tube feedings in the near future.  Betsy Coder, MD  11/03/2019  1:13 PM

## 2019-11-03 NOTE — Progress Notes (Signed)
Peg dressing removed and area cleaned with sterile saline. There was granulation tissue as well as dark, black stool looking drainage at site with strong odor. New dressing applied. MD noted on consult form for facility to please f/u on the G-tube.

## 2019-11-03 NOTE — Telephone Encounter (Signed)
Scheduled appointments per 8/31 los. Gave patient updated calendar at time of service.

## 2019-11-04 ENCOUNTER — Telehealth: Payer: Self-pay | Admitting: *Deleted

## 2019-11-04 DIAGNOSIS — E43 Unspecified severe protein-calorie malnutrition: Secondary | ICD-10-CM | POA: Diagnosis not present

## 2019-11-04 DIAGNOSIS — C4442 Squamous cell carcinoma of skin of scalp and neck: Secondary | ICD-10-CM | POA: Diagnosis not present

## 2019-11-04 DIAGNOSIS — J449 Chronic obstructive pulmonary disease, unspecified: Secondary | ICD-10-CM | POA: Diagnosis not present

## 2019-11-04 DIAGNOSIS — Z931 Gastrostomy status: Secondary | ICD-10-CM | POA: Diagnosis not present

## 2019-11-04 DIAGNOSIS — I480 Paroxysmal atrial fibrillation: Secondary | ICD-10-CM | POA: Diagnosis not present

## 2019-11-04 DIAGNOSIS — D509 Iron deficiency anemia, unspecified: Secondary | ICD-10-CM | POA: Diagnosis not present

## 2019-11-04 DIAGNOSIS — F339 Major depressive disorder, recurrent, unspecified: Secondary | ICD-10-CM | POA: Diagnosis not present

## 2019-11-04 NOTE — Telephone Encounter (Signed)
Notified patient that repeat calcium level was still high at 10.23, but not has high as earlier result. Faxed result to [facility] with order to repeat CMP and an ionized calcium week of 9/6 and fax results to [facility].

## 2019-11-11 DIAGNOSIS — J392 Other diseases of pharynx: Secondary | ICD-10-CM | POA: Diagnosis not present

## 2019-11-11 DIAGNOSIS — I4892 Unspecified atrial flutter: Secondary | ICD-10-CM | POA: Diagnosis not present

## 2019-11-11 DIAGNOSIS — R918 Other nonspecific abnormal finding of lung field: Secondary | ICD-10-CM | POA: Diagnosis not present

## 2019-11-11 DIAGNOSIS — C139 Malignant neoplasm of hypopharynx, unspecified: Secondary | ICD-10-CM | POA: Diagnosis not present

## 2019-11-12 ENCOUNTER — Telehealth: Payer: Self-pay | Admitting: *Deleted

## 2019-11-12 ENCOUNTER — Encounter: Payer: Self-pay | Admitting: *Deleted

## 2019-11-12 ENCOUNTER — Other Ambulatory Visit: Payer: Self-pay | Admitting: Oncology

## 2019-11-12 ENCOUNTER — Other Ambulatory Visit: Payer: Self-pay | Admitting: *Deleted

## 2019-11-12 DIAGNOSIS — C139 Malignant neoplasm of hypopharynx, unspecified: Secondary | ICD-10-CM

## 2019-11-12 NOTE — Telephone Encounter (Addendum)
Informed patient that per thoracic conference, consensus was that a PET scan is needed to evaluate the left lung mass that had increased in size to determine if cyst vs. Cancer. May need to biopsy in future. He understands and agrees to PET scan. Inquired if his labs have been drawn this week as ordered and he said "no". Attempted to call facility to f/u on orders sent on 11/04/19 for CMP and ionized Ca+ and hall phone was never answered. Faxed message to please get this lab done this week. Called back and spoke with nurse, Aldona Bar. She will order the labs to be drawn tomorrow. Provided her my phone # to call for any issues.

## 2019-11-13 DIAGNOSIS — D509 Iron deficiency anemia, unspecified: Secondary | ICD-10-CM | POA: Diagnosis not present

## 2019-11-26 ENCOUNTER — Ambulatory Visit (HOSPITAL_COMMUNITY)
Admission: RE | Admit: 2019-11-26 | Discharge: 2019-11-26 | Disposition: A | Discharge status: Home / Self Care | Payer: Medicaid Other | Source: Ambulatory Visit | Attending: Oncology | Admitting: Oncology

## 2019-11-26 ENCOUNTER — Other Ambulatory Visit: Payer: Self-pay

## 2019-11-26 DIAGNOSIS — C801 Malignant (primary) neoplasm, unspecified: Secondary | ICD-10-CM | POA: Diagnosis not present

## 2019-11-26 DIAGNOSIS — C139 Malignant neoplasm of hypopharynx, unspecified: Secondary | ICD-10-CM

## 2019-11-26 DIAGNOSIS — C770 Secondary and unspecified malignant neoplasm of lymph nodes of head, face and neck: Secondary | ICD-10-CM | POA: Diagnosis not present

## 2019-11-26 LAB — GLUCOSE, CAPILLARY: Glucose-Capillary: 93 mg/dL (ref 70–99)

## 2019-11-26 MED ORDER — FLUDEOXYGLUCOSE F - 18 (FDG) INJECTION
5.4000 | Freq: Once | INTRAVENOUS | Status: AC | PRN
  Administered 2019-11-26: 5.4 via INTRAVENOUS

## 2019-12-03 ENCOUNTER — Inpatient Hospital Stay: Payer: Medicaid Other | Admitting: Nurse Practitioner

## 2019-12-03 ENCOUNTER — Inpatient Hospital Stay: Payer: Medicaid Other | Attending: Oncology

## 2019-12-11 ENCOUNTER — Ambulatory Visit: Payer: Self-pay

## 2019-12-30 DIAGNOSIS — J392 Other diseases of pharynx: Secondary | ICD-10-CM | POA: Diagnosis not present

## 2019-12-30 DIAGNOSIS — I4892 Unspecified atrial flutter: Secondary | ICD-10-CM | POA: Diagnosis not present

## 2019-12-30 DIAGNOSIS — C139 Malignant neoplasm of hypopharynx, unspecified: Secondary | ICD-10-CM | POA: Diagnosis not present

## 2019-12-30 DIAGNOSIS — L8990 Pressure ulcer of unspecified site, unspecified stage: Secondary | ICD-10-CM | POA: Diagnosis not present

## 2019-12-30 DIAGNOSIS — R918 Other nonspecific abnormal finding of lung field: Secondary | ICD-10-CM | POA: Diagnosis not present

## 2019-12-30 DIAGNOSIS — J449 Chronic obstructive pulmonary disease, unspecified: Secondary | ICD-10-CM | POA: Diagnosis not present

## 2019-12-30 DIAGNOSIS — D638 Anemia in other chronic diseases classified elsewhere: Secondary | ICD-10-CM | POA: Diagnosis not present

## 2020-01-13 ENCOUNTER — Telehealth: Payer: Self-pay | Admitting: *Deleted

## 2020-01-13 DIAGNOSIS — C139 Malignant neoplasm of hypopharynx, unspecified: Secondary | ICD-10-CM

## 2020-01-13 NOTE — Telephone Encounter (Signed)
Sister left message requesting to schedule patient's appointments. All COVID restrictions have been lifted at the SNF now. Scheduling message sent to get him in this month w/BS or Lattie Haw, NP and to call sister.

## 2020-01-14 ENCOUNTER — Telehealth: Payer: Self-pay | Admitting: Oncology

## 2020-01-14 NOTE — Telephone Encounter (Signed)
Called pt per 11/10 sch msg - unable to reach pt . Left message with appt date and time on sisters vmail.

## 2020-02-01 ENCOUNTER — Inpatient Hospital Stay: Payer: Medicaid Other | Attending: Oncology | Admitting: Nurse Practitioner

## 2020-02-01 ENCOUNTER — Inpatient Hospital Stay: Payer: Medicaid Other

## 2020-02-01 ENCOUNTER — Encounter: Payer: Self-pay | Admitting: Nurse Practitioner

## 2020-02-01 ENCOUNTER — Other Ambulatory Visit: Payer: Self-pay

## 2020-02-01 VITALS — BP 105/84 | HR 86 | Temp 99.7°F | Resp 20 | Ht 71.0 in | Wt 117.0 lb

## 2020-02-01 DIAGNOSIS — Z8521 Personal history of malignant neoplasm of larynx: Secondary | ICD-10-CM | POA: Diagnosis not present

## 2020-02-01 DIAGNOSIS — R64 Cachexia: Secondary | ICD-10-CM | POA: Diagnosis not present

## 2020-02-01 DIAGNOSIS — J449 Chronic obstructive pulmonary disease, unspecified: Secondary | ICD-10-CM | POA: Diagnosis not present

## 2020-02-01 DIAGNOSIS — D649 Anemia, unspecified: Secondary | ICD-10-CM | POA: Diagnosis not present

## 2020-02-01 DIAGNOSIS — C139 Malignant neoplasm of hypopharynx, unspecified: Secondary | ICD-10-CM

## 2020-02-01 DIAGNOSIS — R918 Other nonspecific abnormal finding of lung field: Secondary | ICD-10-CM | POA: Insufficient documentation

## 2020-02-01 DIAGNOSIS — I4891 Unspecified atrial fibrillation: Secondary | ICD-10-CM | POA: Diagnosis not present

## 2020-02-01 DIAGNOSIS — R1310 Dysphagia, unspecified: Secondary | ICD-10-CM | POA: Diagnosis not present

## 2020-02-01 LAB — CMP (CANCER CENTER ONLY)
ALT: 9 U/L (ref 0–44)
AST: 13 U/L — ABNORMAL LOW (ref 15–41)
Albumin: 3.6 g/dL (ref 3.5–5.0)
Alkaline Phosphatase: 92 U/L (ref 38–126)
Anion gap: 13 (ref 5–15)
BUN: 18 mg/dL (ref 8–23)
CO2: 25 mmol/L (ref 22–32)
Calcium: 10.4 mg/dL — ABNORMAL HIGH (ref 8.9–10.3)
Chloride: 101 mmol/L (ref 98–111)
Creatinine: 0.78 mg/dL (ref 0.61–1.24)
GFR, Estimated: >60 mL/min (ref >60)
Glucose, Bld: 106 mg/dL — ABNORMAL HIGH (ref 70–99)
Potassium: 4.3 mmol/L (ref 3.5–5.1)
Sodium: 139 mmol/L (ref 135–145)
Total Bilirubin: 0.4 mg/dL (ref 0.3–1.2)
Total Protein: 8.5 g/dL — ABNORMAL HIGH (ref 6.5–8.1)

## 2020-02-01 LAB — FERRITIN: Ferritin: 158 ng/mL (ref 24–336)

## 2020-02-01 LAB — CBC WITH DIFFERENTIAL (CANCER CENTER ONLY)
Abs Immature Granulocytes: 0.01 10*3/uL (ref 0.00–0.07)
Basophils Absolute: 0 10*3/uL (ref 0.0–0.1)
Basophils Relative: 1 %
Eosinophils Absolute: 0.1 10*3/uL (ref 0.0–0.5)
Eosinophils Relative: 1 %
HCT: 39.1 % (ref 39.0–52.0)
Hemoglobin: 12.8 g/dL — ABNORMAL LOW (ref 13.0–17.0)
Immature Granulocytes: 0 %
Lymphocytes Relative: 27 %
Lymphs Abs: 1.6 10*3/uL (ref 0.7–4.0)
MCH: 24.6 pg — ABNORMAL LOW (ref 26.0–34.0)
MCHC: 32.7 g/dL (ref 30.0–36.0)
MCV: 75 fL — ABNORMAL LOW (ref 80.0–100.0)
Monocytes Absolute: 0.6 10*3/uL (ref 0.1–1.0)
Monocytes Relative: 11 %
Neutro Abs: 3.6 10*3/uL (ref 1.7–7.7)
Neutrophils Relative %: 60 %
Platelet Count: 280 10*3/uL (ref 150–400)
RBC: 5.21 MIL/uL (ref 4.22–5.81)
RDW: 15.7 % — ABNORMAL HIGH (ref 11.5–15.5)
WBC Count: 5.9 10*3/uL (ref 4.0–10.5)
nRBC: 0 % (ref 0.0–0.2)

## 2020-02-01 NOTE — Progress Notes (Signed)
[place] OFFICE PROGRESS NOTE   Diagnosis: Larynx cancer, left lung mass  INTERVAL HISTORY:   [name] returns for follow-up.  He reports overall feeling well.  He describes his appetite as "the same".  He continues to feedings.  He denies dysphagia but periodically has pain with swallowing.  He denies nausea/vomiting.  No diarrhea.  No shortness of breath.  He denies bleeding.  Objective:  Vital signs in last 24 hours:  Blood pressure 105/84, pulse 86, temperature 99.7 F (37.6 C), temperature source Tympanic, resp. rate 20, height 5\' 11"  (1.803 m), weight 117 lb (53.1 kg), SpO2 98 %.    HEENT: Neck without mass. Lymphatics: No palpable cervical, supraclavicular or axillary lymph nodes. Resp: Scattered bilateral rhonchi.  No respiratory distress. Cardio: Regular rate and rhythm. GI: No hepatomegaly.  Left abdomen feeding tube. Vascular: No leg edema.    Lab Results:  Lab Results  Component Value Date   WBC 5.9 02/01/2020   HGB 12.8 (L) 02/01/2020   HCT 39.1 02/01/2020   MCV 75.0 (L) 02/01/2020   PLT 280 02/01/2020   NEUTROABS 3.6 02/01/2020    Imaging:  No results found.  Medications: I have reviewed the patient's current medications.  Assessment/Plan:  1. 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 Left lower lobe of the lung mass concerning for primary neoplasm versus metastatic disease -07/07/2019 CT angiogram of the chest 2.7 cm mass in the left lower lobe of the  lung -CT 08/17/2019-persistent low-attenuation lesion in the superior segment of left lower lobe with surrounding consolidation/collapse, stable from 07/07/2019, consolidation and groundglass opacity in the left lower lobe have largely resolved -08/20/2019 bronchoscopy-biopsy nondiagnostic             -CT chest 11/02/2019-, increased in size concerning for a primary lung malignancy versus a metastasis versus pulmonary abscess, improved left lower lobe aeration, COPD  -PET scan 11/26/2019-3.7 cm left lower lobe pulmonary mass, centrally necrotic and markedly hypermetabolic.  Second tiny 7 mm irregular nodular opacity in the superior segment left lower lobe shows low-level FDG accumulation.  Low-level uptake in an enlarged AP window lymph node.  Mild hypermetabolism in a normal-appearing left axillary lymph node, felt to be reactive.  Bony uptake identified in the pelvis without underlying lesions evident by CT. 3. Cachexia 4. History of aspiration pneumonia 5. Mild anemia 6. Atrial fibrillation with RVR 6. COPD 8. Alcohol abuse 9. Tobacco dependence 10. Cocaine abuse 11. Odynophagia secondary to radiation-improved   Disposition: Mr. Decuir appears stable.  We reviewed the PET scan report/images with him at today's visit.  We are referring him for a biopsy of the left lung mass.  We reviewed the CBC from today.  He has developed red cell microcytosis.  He is not aware of bleeding.  We will add a ferritin to today's labs.  He will complete a set of stool cards.  He will return for lab and follow-up in approximately 3 weeks.  Patient seen with Dr. Benay Spice.    Ned Card ANP/GNP-BC   02/01/2020  12:18 PM  This was a shared visit with Ned Card.  Mr. Muldrew appears well.  We reviewed  the PET images with him.  His case was presented at the thoracic tumor conference.  Biopsy of the left lung mass is recommended.  He agrees to referral for a CT-guided biopsy  of the hypermetabolic left lung mass.  Julieanne Manson, MD

## 2020-02-02 ENCOUNTER — Telehealth: Payer: Self-pay | Admitting: Nurse Practitioner

## 2020-02-02 ENCOUNTER — Encounter (HOSPITAL_COMMUNITY): Payer: Self-pay

## 2020-02-02 ENCOUNTER — Other Ambulatory Visit: Payer: Self-pay | Admitting: Nurse Practitioner

## 2020-02-02 NOTE — Telephone Encounter (Signed)
Scheduled appointment per 11/29 los. Spoke to patient who is aware of appointment date and time.

## 2020-02-02 NOTE — Progress Notes (Unsigned)
[address]   Devon Peterson Male, 64 y.o., 1955/11/14 MRN:  [id number] Phone:  [phone] (H) ... PCP:  Patient, No Pcp Per Coverage:  [place] [employer]/[place] [employer] Next Appt With Radiology ([facility]-CT 3) 02/11/2020 at 11:00 AM  RE: Biopsy Received: Today Message Details  Suttle, Rosanne Ashing, MD  [name] Approved for CT guided left lower lobe mass biopsy.   Devon Peterson   Previous Messages  ----- Message -----  From: Lenore Cordia  Sent: 02/01/2020  2:25 PM EST  To: [facility] Procedure Requests  Subject: Biopsy                      Procedure Requested: CT Biopsy    Reason for Procedure: h/o larynx cancer, biopsy left lung mass    Provider Requesting: Owens Shark  Provider Telephone: [phone]    Other Info:

## 2020-02-03 ENCOUNTER — Other Ambulatory Visit: Payer: Self-pay | Admitting: Nurse Practitioner

## 2020-02-03 DIAGNOSIS — R918 Other nonspecific abnormal finding of lung field: Secondary | ICD-10-CM

## 2020-02-03 DIAGNOSIS — C139 Malignant neoplasm of hypopharynx, unspecified: Secondary | ICD-10-CM

## 2020-02-10 ENCOUNTER — Telehealth: Payer: Self-pay | Admitting: *Deleted

## 2020-02-10 ENCOUNTER — Other Ambulatory Visit: Payer: Self-pay | Admitting: Student

## 2020-02-10 NOTE — Telephone Encounter (Signed)
CALLED PATIENT TO ASK ABOUT CHANGING FU APPT. TO  02-17-20 FROM 02-12-20 DUE TO HIM HAVING A BIOPSY ON 02-11-20, PATIENT AGREED TO NEW DATE AND TIME

## 2020-02-11 ENCOUNTER — Ambulatory Visit (HOSPITAL_COMMUNITY)
Admission: RE | Admit: 2020-02-11 | Discharge: 2020-02-11 | Disposition: A | Discharge status: Home / Self Care | Payer: Medicaid Other | Source: Ambulatory Visit | Attending: Nurse Practitioner | Admitting: Nurse Practitioner

## 2020-02-11 ENCOUNTER — Other Ambulatory Visit: Payer: Self-pay

## 2020-02-11 DIAGNOSIS — J439 Emphysema, unspecified: Secondary | ICD-10-CM | POA: Diagnosis not present

## 2020-02-11 DIAGNOSIS — Z87891 Personal history of nicotine dependence: Secondary | ICD-10-CM | POA: Diagnosis not present

## 2020-02-11 DIAGNOSIS — C139 Malignant neoplasm of hypopharynx, unspecified: Secondary | ICD-10-CM | POA: Insufficient documentation

## 2020-02-11 DIAGNOSIS — Z20822 Contact with and (suspected) exposure to covid-19: Secondary | ICD-10-CM | POA: Diagnosis not present

## 2020-02-11 DIAGNOSIS — C7802 Secondary malignant neoplasm of left lung: Secondary | ICD-10-CM | POA: Insufficient documentation

## 2020-02-11 DIAGNOSIS — R918 Other nonspecific abnormal finding of lung field: Secondary | ICD-10-CM | POA: Diagnosis not present

## 2020-02-11 LAB — CBC
HCT: 38.7 % — ABNORMAL LOW (ref 39.0–52.0)
Hemoglobin: 12.8 g/dL — ABNORMAL LOW (ref 13.0–17.0)
MCH: 24.9 pg — ABNORMAL LOW (ref 26.0–34.0)
MCHC: 33.1 g/dL (ref 30.0–36.0)
MCV: 75.1 fL — ABNORMAL LOW (ref 80.0–100.0)
Platelets: 381 10*3/uL (ref 150–400)
RBC: 5.15 MIL/uL (ref 4.22–5.81)
RDW: 15.7 % — ABNORMAL HIGH (ref 11.5–15.5)
WBC: 5.3 10*3/uL (ref 4.0–10.5)
nRBC: 0 % (ref 0.0–0.2)

## 2020-02-11 LAB — RESP PANEL BY RT-PCR (FLU A&B, COVID) ARPGX2
Influenza A by PCR: NEGATIVE
Influenza B by PCR: NEGATIVE
SARS Coronavirus 2 by RT PCR: NEGATIVE

## 2020-02-11 LAB — PROTIME-INR
INR: 1 (ref 0.8–1.2)
Prothrombin Time: 13.1 seconds (ref 11.4–15.2)

## 2020-02-11 MED ORDER — MIDAZOLAM HCL 2 MG/2ML IJ SOLN
INTRAMUSCULAR | Status: AC
  Filled 2020-02-11: qty 2

## 2020-02-11 MED ORDER — HYDROCODONE-ACETAMINOPHEN 5-325 MG PO TABS: 1.0000 | ORAL_TABLET | ORAL | Status: DC | PRN

## 2020-02-11 MED ORDER — SODIUM CHLORIDE 0.9 % IV SOLN: INTRAVENOUS | Status: DC

## 2020-02-11 MED ORDER — MIDAZOLAM HCL 2 MG/2ML IJ SOLN
INTRAMUSCULAR | Status: AC | PRN
  Administered 2020-02-11: 1 mg via INTRAVENOUS
  Administered 2020-02-11: 0.5 mg via INTRAVENOUS

## 2020-02-11 MED ORDER — FENTANYL CITRATE (PF) 100 MCG/2ML IJ SOLN
INTRAMUSCULAR | Status: AC
  Filled 2020-02-11: qty 2

## 2020-02-11 MED ORDER — FENTANYL CITRATE (PF) 100 MCG/2ML IJ SOLN
INTRAMUSCULAR | Status: AC | PRN
Start: 2020-02-11 — End: 2020-02-11
  Administered 2020-02-11 (×2): 25 ug via INTRAVENOUS

## 2020-02-11 NOTE — H&P (Signed)
Chief Complaint: Patient was seen in consultation today for left lower lobe mass biopsy   Referring Physician(s): Dr. Benay Spice  Supervising Physician: Arne Cleveland  Patient Status: [facility] - Out-pt  History of Present Illness: Devon Peterson is a 64 y.o. male with a medical history significant for COPD, paroxysmal atrial fibrillation/flutter, depression, cocaine/tobacco/alcohol abuse, squamous cell carcinoma of the larynx (May 2021, s/p radiation) and a left lower lobe mass s/p non-diagnostic bronchoscopy 08/20/19. CT imaging 11/02/19 showed the lung mass had increased in size and a PET was ordered for additional work up.   CT Chest 11/02/19 IMPRESSION: 1. Redemonstrated hypodense mass of the dependent left lower lobe measuring 3.7 x 3.2 cm, increased in size compared to prior examination, measuring 2.8 x 2.1 cm at that time. Findings are worrisome for enlarging primary lung malignancy malignancy or metastasis, although pulmonary abscess could have this appearance. Consider PET-CT for metabolic characterization and tissue sampling. 2. Improved aeration of the left lower lobe. 3. Emphysema (ICD10-J43.9). 4. Coronary artery disease.  NM PET 11/26/19 IMPRESSION: 1. The 3.7 cm left lower lobe pulmonary mass is centrally necrotic and markedly hypermetabolic, consistent with neoplasm. This could represent metastatic disease or lung primary. 2. 2nd tiny 7 mm irregular nodular opacity in the superior segment left lower lobe shows low level FDG accumulation. Finding is considered indeterminate and may be infectious/inflammatory although neoplasm not excluded. 3. Low level uptake in in unenlarged AP window lymph node. Attention on follow-up recommended. 4. Mild hypermetabolism in a normal appearing left axillary lymph node. Given history of COVID vaccination in the left upper extremity 1 week ago, this finding is felt to be reactive. 5. Bony uptake identified in the pelvis without underlying  lesions evident by CT. Uptake appears to map to areas of muscle attachment and is probably reactive. Attention to these regions on follow-up recommended. 6.  Aortic Atherosclerois (ICD10-170.0) 7.  Emphysema. (JTT01-X79.9)  Interventional Radiology has been asked to evaluate this patient for an image-guided left lower lobe mass biopsy. This case has been reviewed and procedure approved by Dr. Serafina Royals.   Past Medical History:  Diagnosis Date  . COPD (chronic obstructive pulmonary disease) ([place])   . Medical history non-contributory     Past Surgical History:  Procedure Laterality Date  . BIOPSY  08/20/2019   Procedure: BIOPSY;  Surgeon: Chesley Mires, MD;  Location: [facility];  Service: Endoscopy;;  . BRONCHIAL WASHINGS  08/20/2019   Procedure: BRONCHIAL WASHINGS;  Surgeon: Chesley Mires, MD;  Location: [facility];  Service: Endoscopy;;  LLL BAL  . DIRECT LARYNGOSCOPY N/A 07/09/2019   Procedure: DIRECT LARYNGOSCOPY WITH BIOPSY;  Surgeon: Jerrell Belfast, MD;  Location: [facility];  Service: ENT;  Laterality: N/A;  . IR FLUORO RM 30-60 MIN  07/13/2019  . IR GASTROSTOMY TUBE MOD SED  07/14/2019  . IR REPLACE G-TUBE SIMPLE WO FLUORO  10/09/2019  . NO PAST SURGERIES    . TRACHEOSTOMY TUBE PLACEMENT N/A 07/09/2019   Procedure: TRACHEOSTOMY;  Surgeon: Jerrell Belfast, MD;  Location: [facility];  Service: ENT;  Laterality: N/A;  . VIDEO BRONCHOSCOPY Left 08/20/2019   Procedure: VIDEO BRONCHOSCOPY WITH FLUORO;  Surgeon: Chesley Mires, MD;  Location: [facility];  Service: Endoscopy;  Laterality: Left;    Allergies: Albumin (human)  Medications: Prior to Admission medications   Medication Sig Start Date End Date Taking? Authorizing Provider  Amino Acids-Protein Hydrolys (FEEDING SUPPLEMENT, PRO-STAT SUGAR FREE 64,) LIQD Place 30 mLs into feeding tube 2 (two) times daily. 08/28/19  Yes Bonner Puna,  Meredith Leeds, MD  chlorhexidine gluconate, MEDLINE KIT, (PERIDEX) 0.12 % solution 15 mLs by Mouth Rinse route 2 (two)  times daily. 08/28/19  Yes Patrecia Pour, MD  ferrous sulfate 325 (65 FE) MG tablet Take 1 tablet (325 mg total) by mouth daily. 08/28/19 08/27/20 Yes Patrecia Pour, MD  metoprolol tartrate (LOPRESSOR) 25 MG tablet Take 0.5 tablets (12.5 mg total) by mouth 2 (two) times daily. 08/28/19 02/09/20 Yes Patrecia Pour, MD  Nutritional Supplements (ISOSOURCE 1.5 CAL) LIQD Place 237 mLs into feeding tube 5 (five) times daily. Patient taking differently: Place 60 mLs into feeding tube See admin instructions. Give 60 ml per hour in feeding tube for 12 hours a day (1900 - 0700) 08/28/19  Yes Patrecia Pour, MD  nystatin (MYCOSTATIN) 100000 UNIT/ML suspension Take 5 mLs by mouth 4 (four) times daily as needed (thrush).   Yes [provider]  oxycodone (OXY-IR) 5 MG capsule Take 5 mg by mouth every 8 (eight) hours as needed for pain.    Yes [provider]  pantoprazole (PROTONIX) 40 MG tablet Take 1 tablet (40 mg total) by mouth daily. 08/28/19 08/27/20 Yes Patrecia Pour, MD  polyethylene glycol (MIRALAX / GLYCOLAX) 17 g packet Take 17 g by mouth daily. 08/28/19  Yes Patrecia Pour, MD  senna (SENOKOT) 8.6 MG TABS tablet Take 2 tablets (17.2 mg total) by mouth daily. 08/28/19  Yes Patrecia Pour, MD  sertraline (ZOLOFT) 50 MG tablet Take 1 tablet (50 mg total) by mouth daily. 08/28/19 08/27/20 Yes Patrecia Pour, MD  thiamine 100 MG tablet Take 1 tablet (100 mg total) by mouth daily. 08/28/19  Yes Patrecia Pour, MD     Family History  Problem Relation Age of Onset  . CAD Father     Social History   Socioeconomic History  . Marital status: Single    Spouse name: Not on file  . Number of children: Not on file  . Years of education: Not on file  . Highest education level: Not on file  Occupational History  . Not on file  Tobacco Use  . Smoking status: Former Smoker    Packs/day: 1.00    Years: 39.00    Pack years: 39.00    Types: Cigarettes    Quit date: 07/07/2019    Years since quitting: 0.6   . Smokeless tobacco: Never Used  Substance and Sexual Activity  . Alcohol use: Yes    Alcohol/week: 24.0 standard drinks    Types: 24 Cans of beer per week  . Drug use: Not Currently  . Sexual activity: Not on file  Other Topics Concern  . Not on file  Social History Narrative  . Not on file   Social Determinants of Health   Financial Resource Strain: Not on file  Food Insecurity: Not on file  Transportation Needs: Not on file  Physical Activity: Not on file  Stress: Not on file  Social Connections: Not on file    Review of Systems: A 12 point ROS discussed and pertinent positives are indicated in the HPI above.  All other systems are negative.  Review of Systems  Constitutional: Positive for fatigue. Negative for appetite change.  Respiratory: Positive for cough. Negative for shortness of breath.   Cardiovascular: Negative for chest pain and leg swelling.  Gastrointestinal: Negative for abdominal pain, diarrhea, nausea and vomiting.  Musculoskeletal: Negative for back pain.  Neurological: Negative for headaches.    Vital Signs: BP  112/83   Pulse (!) 109   Temp 98.6 F (37 C) (Oral)   Ht _0  (1.803 m)   Wt 117 lb 1 oz (53.1 kg)   SpO2 100%   BMI 16.33 kg/m   Physical Exam Constitutional:      General: He is not in acute distress.    Comments: cachectic  HENT:     Mouth/Throat:     Mouth: Mucous membranes are moist.     Pharynx: Oropharynx is clear.  Cardiovascular:     Rate and Rhythm: Normal rate and regular rhythm.     Pulses: Normal pulses.     Heart sounds: Normal heart sounds.  Pulmonary:     Effort: Pulmonary effort is normal.     Breath sounds: Normal breath sounds.     Comments: Productive cough; clear sputum.  Abdominal:     General: Bowel sounds are normal.     Palpations: Abdomen is soft.     Comments: Gastrostomy tube in place; clamped.   Skin:    General: Skin is warm and dry.  Neurological:     Mental Status: He is alert and  oriented to person, place, and time.  Psychiatric:        Mood and Affect: Mood is depressed.     Imaging: No results found.  Labs:  CBC: Recent Labs    08/23/19 0441 08/25/19 0359 02/01/20 1117 02/11/20 0834  WBC 3.1* 3.5* 5.9 5.3  HGB 9.3* 10.2* 12.8* 12.8*  HCT 29.1* 31.4* 39.1 38.7*  PLT 212 249 280 381    COAGS: Recent Labs    07/07/19 1650 07/13/19 0455 02/11/20 1013  INR 1.0 1.1 1.0  APTT 27  --   --     BMP: Recent Labs    08/23/19 0441 08/26/19 1019 11/02/19 1343 11/03/19 1404 02/01/20 1117  NA 138 137 135 138 139  K 4.3 4.1 4.5 4.5 4.3  CL 97* 99 97* 98 101  CO2 _1 GLUCOSE 91 122* 96 86 106*  BUN _2 24* 18  CALCIUM 9.3 9.0 11.1* 11.1* 10.4*  CREATININE 0.43* 0.43* 0.72 0.79 0.78  GFRNONAA >60 >60 >60 >60 >60  GFRAA >60 >60 >60 >60  --     LIVER FUNCTION TESTS: Recent Labs    08/17/19 0432 08/21/19 0330 08/23/19 0441 02/01/20 1117  BILITOT 0.2* 0.5 0.3 0.4  AST _3 13*  ALT _4 ALKPHOS 60 62 60 92  PROT 6.7 6.8 6.6 8.5*  ALBUMIN 3.1* 3.2* 3.2* 3.6    TUMOR MARKERS: No results for input(s): AFPTM, CEA, CA199, CHROMGRNA in the last 8760 hours.  Assessment and Plan:  Left lower lobe lung mass; history of squamous cell carcinoma of the larynx: Devon Peterson, 64 year old male, presents today to the [facility] for an image-guided left lower lobe lung mass biopsy.   Risks and benefits of CT guided lung mass biopsy were discussed with the patient including, but not limited to bleeding, hemoptysis, respiratory failure requiring intubation, infection, pneumothorax requiring chest tube placement, stroke from air embolism or even death.  All of the patient's questions were answered and the patient is agreeable to proceed.  The patient has been NPO. Vitals have been reviewed. Lab work, including a COVID test (negative result), have been reviewed.   Consent signed and in chart.  Thank  you for this interesting consult.  I greatly enjoyed meeting Devon Peterson and look  forward to participating in their care.  A copy of this report was sent to the requesting provider on this date.  Electronically Signed: Soyla Dryer, AGACNP-BC [phone] 02/11/2020, 10:51 AM   I spent a total of  30 Minutes   in face to face in clinical consultation, greater than 50% of which was counseling/coordinating care for image-guided left lower lobe mass biopsy.

## 2020-02-11 NOTE — Discharge Instructions (Signed)
Lung Biopsy, Care After This sheet gives you information about how to care for yourself after your procedure. Your health care provider may also give you more specific instructions depending on the type of biopsy you had. If you have problems or questions, contact your health care provider. What can I expect after the procedure? After the procedure, it is common to have:  A cough.  A sore throat.  Pain where a needle, bronchoscope, or incision was used to collect a biopsy sample (biopsy site). Follow these instructions at home: Medicines  Take over-the-counter and prescription medicines only as told by your health care provider.  Do not drink alcohol if your health care provider tells you not to drink.  Ask your health care provider if the medicine prescribed to you: ? Requires you to avoid driving or using heavy machinery. ? Can cause constipation. You may need to take these actions to prevent or treat constipation:  Drink enough fluid to keep your urine pale yellow.  Take over-the-counter or prescription medicines.  Eat foods that are high in fiber, such as beans, whole grains, and fresh fruits and vegetables.  Limit foods that are high in fat and processed sugars, such as fried or sweet foods.  Do not drive for 24 hours if you were given a sedative. Biopsy site care   Follow instructions from your health care provider about how to take care of your biopsy site. Make sure you: ? Wash your hands with soap and water before and after you change your bandage (dressing). If soap and water are not available, use hand sanitizer. ? Change your dressing as told by your health care provider. ? Leave stitches (sutures), skin glue, or adhesive strips in place. These skin closures may need to stay in place for 2 weeks or longer. If adhesive strip edges start to loosen and curl up, you may trim the loose edges. Do not remove adhesive strips completely unless your health care provider tells  you to do that.  Do not take baths, swim, or use a hot tub until your health care provider approves. Ask your health care provider if you may take showers. You may only be allowed to take sponge baths.  Check your biopsy site every day for signs of infection. Check for: ? Redness, swelling, or more pain. ? Fluid or blood. ? Warmth. ? Pus or a bad smell. General instructions  Return to your normal activities as told by your health care provider. Ask your health care provider what activities are safe for you.  It is up to you to get the results of your procedure. Ask your health care provider, or the department that is doing the procedure, when your results will be ready.  Keep all follow-up visits as told by your health care provider. This is important. Contact a health care provider if:  You have a fever.  You have redness, swelling, or more pain around your biopsy site.  You have fluid or blood coming from your biopsy site.  Your biopsy site feels warm to the touch.  You have pus or a bad smell coming from your biopsy site.  You have pain that does not get better with medicine. Get help right away if:  You cough up blood.  You have trouble breathing.  You have chest pain.  You lose consciousness. Summary  After the procedure, it is common to have a sore throat and a cough.  Return to your normal activities as told by your  health care provider. Ask your health care provider what activities are safe for you.  Take over-the-counter and prescription medicines only as told by your health care provider.  Report any unusual symptoms to your health care provider. This information is not intended to replace advice given to you by your health care provider. Make sure you discuss any questions you have with your health care provider. Document Revised: 03/26/2018 Document Reviewed: 03/20/2016 Elsevier Patient Education  [place].

## 2020-02-11 NOTE — Procedures (Signed)
  Procedure: CT core biopsy L lung mass   EBL:   minimal Complications:  none immediate  See full dictation in [employer].  Dillard Cannon MD Main # [phone] Pager  [phone] Mobile [phone]

## 2020-02-11 NOTE — Sedation Documentation (Signed)
Patient is resting comfortably. 

## 2020-02-12 ENCOUNTER — Other Ambulatory Visit: Payer: Self-pay

## 2020-02-12 ENCOUNTER — Ambulatory Visit: Payer: Self-pay | Admitting: Radiation Oncology

## 2020-02-15 ENCOUNTER — Encounter: Payer: Self-pay | Admitting: *Deleted

## 2020-02-15 LAB — SURGICAL PATHOLOGY

## 2020-02-15 NOTE — Progress Notes (Signed)
I received a call from Dr. Earma Reading regarding recent pathology of squamous cell. I notified Dr. Benay Spice and Ned Card NP.

## 2020-02-17 ENCOUNTER — Ambulatory Visit
Admission: RE | Admit: 2020-02-17 | Discharge: 2020-02-17 | Disposition: A | Discharge status: Home / Self Care | Payer: Medicaid Other | Source: Ambulatory Visit | Attending: Radiation Oncology | Admitting: Radiation Oncology

## 2020-02-17 ENCOUNTER — Other Ambulatory Visit: Payer: Self-pay

## 2020-02-17 VITALS — BP 115/85 | HR 88 | Temp 97.5°F | Resp 18 | Ht 71.0 in | Wt 111.4 lb

## 2020-02-17 DIAGNOSIS — Z85818 Personal history of malignant neoplasm of other sites of lip, oral cavity, and pharynx: Secondary | ICD-10-CM | POA: Diagnosis not present

## 2020-02-17 DIAGNOSIS — C4442 Squamous cell carcinoma of skin of scalp and neck: Secondary | ICD-10-CM

## 2020-02-17 DIAGNOSIS — C3432 Malignant neoplasm of lower lobe, left bronchus or lung: Secondary | ICD-10-CM

## 2020-02-17 DIAGNOSIS — Z923 Personal history of irradiation: Secondary | ICD-10-CM | POA: Diagnosis not present

## 2020-02-17 DIAGNOSIS — Z79899 Other long term (current) drug therapy: Secondary | ICD-10-CM | POA: Insufficient documentation

## 2020-02-17 DIAGNOSIS — C139 Malignant neoplasm of hypopharynx, unspecified: Secondary | ICD-10-CM

## 2020-02-17 NOTE — Progress Notes (Signed)
Devon Peterson presents for follow up of radiation to his larynx completed on 08/21/2019  Pain issues, if any: Patient denies Using a feeding tube?: Yes--reports staff at SNF help with feedings Weight changes, if any:  Wt Readings from Last 3 Encounters:  02/17/20 111 lb 6.4 oz (50.5 kg)  02/11/20 117 lb 1 oz (53.1 kg)  02/01/20 117 lb (53.1 kg)   Swallowing issues, if any: Patient reports he is able to tolerate softer foods (mashed potatoes, eggs, etc), but only eats small amounts Smoking or chewing tobacco? Continues to smoke ~3 cigarettes a day Using fluoride trays daily? N/A Last ENT visit was on: Not since hospitalization earlier this year Other notable issues, if any:  CT guided biopsy of left lung mass showed  02/11/2020 FINAL MICROSCOPIC DIAGNOSIS:  A. LUNG MASS, LEFT, NEEDLE CORE BIOPSY:  - Squamous cell carcinoma.  COMMENT:  Immunohistochemistry for p40 and CK5/6 is positive. TTF-1,  Scheduled for F/U with Dr. Dominica Severin B. Sherrill on 02/23/2020 Reports he's been dealing with a congested cough.   Vitals:   02/17/20 1032  BP: 115/85  Pulse: 88  Resp: 18  Temp: (!) 97.5 F (36.4 C)  SpO2: 100%

## 2020-02-19 ENCOUNTER — Encounter: Payer: Self-pay | Admitting: Radiation Oncology

## 2020-02-19 DIAGNOSIS — C3432 Malignant neoplasm of lower lobe, left bronchus or lung: Secondary | ICD-10-CM | POA: Insufficient documentation

## 2020-02-19 NOTE — Progress Notes (Signed)
Radiation Oncology         [phone] ________________________________  Name: Devon Peterson MRN: [id number]  Date: 02/17/2020  DOB: 1955-10-09  Follow-Up Visit Note  CC: Patient, No Pcp Per  No ref. provider found  Diagnosis and Prior Radiotherapy:       ICD-10-CM   1. 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 Malignant neoplasm of lower lobe of left lung [facility])  C34.32     Radiation Treatment Dates: 07/23/2019 through 08/21/2019 Site Technique Total Dose (Gy) Dose per Fx (Gy) Completed Fx Beam Energies  Laryngopharynx: HN_larynx IMRT 52/52 2.6 20/20 6X     CHIEF COMPLAINT:  Here for follow-up and surveillance of throat cancer (and new lung mass)  Narrative:   Devon Peterson presents for follow up of radiation to his larynx completed on 08/21/2019  Pain issues, if any: Patient denies Using a feeding tube?: Yes--reports staff at SNF help with feedings Weight changes, if any:  Wt Readings from Last 3 Encounters:  02/17/20 111 lb 6.4 oz (50.5 kg)  02/11/20 117 lb 1 oz (53.1 kg)  02/01/20 117 lb (53.1 kg)   Swallowing issues, if any: Patient reports he is able to tolerate softer foods (mashed potatoes, eggs, etc), but only eats small amounts Smoking or chewing tobacco? Continues to smoke ~3 cigarettes a day Using fluoride trays daily? N/A Last ENT visit was on: Not since hospitalization earlier this year Other notable issues, if any: His most recent imaging of his lung was reviewed at tumor board. He has a growing left lower lung mass and an adjacent suspicious satellite lesion.  PET scan on November 26, 2019 revealed a 3.7 cm left lower lobe pulmonary mass and a tiny 7 mm irregular nodular opacity in the superior segment of the left lower lobe. There was low-level uptake in an enlarged AP window lymph node that was nonspecific.  CT guided biopsy of left lung mass showed  02/11/2020 FINAL MICROSCOPIC DIAGNOSIS:  A. LUNG MASS, LEFT,  NEEDLE CORE BIOPSY:  - Squamous cell carcinoma.   Immunohistochemistry for p40 and CK5/6 is positive. TTF-1,  Synaptophysin, Chromogranin and CD56 are negative. If applicable, there  is likely sufficient tissue for ancillary studies (Block A1).   Scheduled for F/U with Dr. Dominica Severin B. Sherrill on 02/23/2020 Reports he's been dealing with a congested cough that is chronic. He has received 2 Covid shots. He is with an attendant from his nursing facility. Apparently they anticipate booster shots to be delivered in the near future but the exact date is not clear.   Vitals:   02/17/20 1032  BP: 115/85  Pulse: 88  Resp: 18  Temp: (!) 97.5 F (36.4 C)  SpO2: 100%                       ALLERGIES:  is allergic to albumin (human).  Meds: Current Outpatient Medications  Medication Sig Dispense Refill  . Amino Acids-Protein Hydrolys (FEEDING SUPPLEMENT, PRO-STAT SUGAR FREE 64,) LIQD Place 30 mLs into feeding tube 2 (two) times daily. 887 mL 0  . chlorhexidine gluconate, MEDLINE KIT, (PERIDEX) 0.12 % solution 15 mLs by Mouth Rinse route 2 (two) times daily. 120 mL 0  . ferrous sulfate 325 (65 FE) MG tablet Take 1 tablet (325 mg total) by mouth daily.    . metoprolol tartrate (LOPRESSOR) 25 MG tablet Take 0.5 tablets (12.5 mg total) by mouth 2 (two) times  daily.    . Nutritional Supplements (ISOSOURCE 1.5 CAL) LIQD Place 237 mLs into feeding tube 5 (five) times daily. (Patient taking differently: Place 60 mLs into feeding tube See admin instructions. Give 60 ml per hour in feeding tube for 12 hours a day (1900 - 0700))  0  . nystatin (MYCOSTATIN) 100000 UNIT/ML suspension Take 5 mLs by mouth 4 (four) times daily as needed (thrush).    Marland Kitchen oxycodone (OXY-IR) 5 MG capsule Take 5 mg by mouth every 8 (eight) hours as needed for pain.     . pantoprazole (PROTONIX) 40 MG tablet Take 1 tablet (40 mg total) by mouth daily.    . polyethylene glycol (MIRALAX / GLYCOLAX) 17 g packet Take 17 g by mouth daily. 14  each 0  . senna (SENOKOT) 8.6 MG TABS tablet Take 2 tablets (17.2 mg total) by mouth daily. 120 tablet 0  . sertraline (ZOLOFT) 50 MG tablet Take 1 tablet (50 mg total) by mouth daily.    Marland Kitchen thiamine 100 MG tablet Take 1 tablet (100 mg total) by mouth daily.     No current facility-administered medications for this encounter.    Physical Findings: The patient is in no acute distress. Patient is alert and oriented. Wt Readings from Last 3 Encounters:  02/17/20 111 lb 6.4 oz (50.5 kg)  02/11/20 117 lb 1 oz (53.1 kg)  02/01/20 117 lb (53.1 kg)    height is _0  (1.803 m) and weight is 111 lb 6.4 oz (50.5 kg). His temporal temperature is 97.5 F (36.4 C) (abnormal). His blood pressure is 115/85 and his pulse is 88. His respiration is 18 and oxygen saturation is 100%. .  General: Alert and oriented, in no acute distress, in WC HEENT: Head is normocephalic. Extraocular movements are intact. Oropharynx is notable for no lesions in upper throat  Neck: Neck is notable for no palpable adenopathy. Skin: Skin in treatment fields shows satisfactory healing  Chest notable for decreased breath sounds bilaterally  Heart is regular in rate and rhythm  ECOG = 3  0 - Asymptomatic (Fully active, able to carry on all predisease activities without restriction)  1 - Symptomatic but completely ambulatory (Restricted in physically strenuous activity but ambulatory and able to carry out work of a light or sedentary nature. For example, light housework, office work)  2 - Symptomatic, <50% in bed during the day (Ambulatory and capable of all self care but unable to carry out any work activities. Up and about more than 50% of waking hours)  3 - Symptomatic, >50% in bed, but not bedbound (Capable of only limited self-care, confined to bed or chair 50% or more of waking hours)  4 - Bedbound (Completely disabled. Cannot carry on any self-care. Totally confined to bed or chair)  5 - Death   Eustace Pen MM, Creech RH,  Tormey DC, et al. [phone]). "Toxicity and response criteria of the [facility] Group". [place] Oncol. 5 (6): 649-55    Lab Findings: Lab Results  Component Value Date   WBC 5.3 02/11/2020   HGB 12.8 (L) 02/11/2020   HCT 38.7 (L) 02/11/2020   MCV 75.1 (L) 02/11/2020   PLT 381 02/11/2020    Lab Results  Component Value Date   TSH 2.015 04/18/2019    Radiographic Findings: CT Biopsy  Result Date: 02/11/2020 CLINICAL DATA:  Head and neck carcinoma. Hypermetabolic left lower lobe lung mass. EXAM: CT GUIDED CORE BIOPSY OF LUNG MASS ANESTHESIA/SEDATION: Intravenous Fentanyl 54mg  and Versed 1.42m were administered as conscious sedation during continuous monitoring of the patient's level of consciousness and physiological / cardiorespiratory status by the radiology RN, with a total moderate sedation time of 20 minutes. PROCEDURE: The procedure risks, benefits, and alternatives were explained to the patient. Questions regarding the procedure were encouraged and answered. The patient understands and consents to the procedure. Patient placed prone. Select axial scans through the thorax obtained. The posterior left lower lobe mass was localized and an appropriate skin entry site was determined and marked. The operative field was prepped with chlorhexidinein a sterile fashion, and a sterile drape was applied covering the operative field. A sterile gown and sterile gloves were used for the procedure. Local anesthesia was provided with 1% Lidocaine. Under CT fluoroscopic guidance, a 17 gauge trocar needle was advanced to the margin of the lesion. Once needle tip position was confirmed, coaxial 18-gauge core biopsy samples were obtained, submitted in formalin to surgical pathology. The guide needle was removed. Small pneumothorax was noted on the immediate post procedure images. Delayed imaging shows no progression of collection consistent with some air having entered through the guide  needle, as no aerated lung was traversed to be source of pneumothorax. COMPLICATIONS: None immediate FINDINGS: Posterior left lower lobe lung mass localized. Representative core biopsy samples obtained as above. IMPRESSION: Technically successful CT-guided core biopsy, left lower lobe lung mass. Electronically Signed   By: DLucrezia EuropeM.D.   On: 02/11/2020 17:11    Impression/Plan:    1) Head and Neck Cancer Status: No evidence of local /regional recurrence but unfortunately he has a very large left lower lung mass that has been growing. This was finally biopsied and showed squamous cell carcinoma -is unclear if this is metastatic or primary. He was reviewed at tumor board. The consensus is that he is a candidate for aggressive local radiation therapy versus systemic therapy. I have reached out to Dr. SBenay Spiceto discuss his case personally as Dr. SBenay Spicewas not at the tumor board. In my assessment, given the patient's performance status as well as the limited degree of disease on his imaging, I think that radiation therapy is a reasonable first step to achieve local control and systemic therapy can be considered, as warranted, in the future, depending on subsequent imaging. We will schedule him for simulation depending on my conversation with Dr. SBenay Spice Consent form was signed with the patient just in case we proceed with radiation therapy. He is enthusiastic about this if the team determines it to be the most appropriate option.  2) Nutritional Status: Decreased over the past month, continuing tube feeds and oral intake as tolerated  Wt Readings from Last 3 Encounters:  02/17/20 111 lb 6.4 oz (50.5 kg)  02/11/20 117 lb 1 oz (53.1 kg)  02/01/20 117 lb (53.1 kg)    PEG tube: Skilled nursing facility staff help some use this  3) Risk Factors: The patient has been educated about risk factors including alcohol and tobacco abuse; they understand that avoidance of alcohol and tobacco is important to  prevent recurrences as well as other cancers; unfortunately he is still smoking and we discussed the importance of cessation  4) Swallowing: Continue speech-language pathology exercises at skilled nursing facility for dysphagia  5)  Thyroid function: Check annually Lab Results  Component Value Date   TSH 2.015 04/18/2019    6)   We discussed measures to reduce the risk of infection during the COVID-19 pandemic.   I recommended he  get vaccinated with his booster. It is not clear when this will be done at his nursing facility. I will ask our nurses to offer him the vaccine next week if he has not gotten his booster by then.    Addendum: I spoke with Dr. Benay Spice and our consensus is to proceed with radiation therapy. We will schedule him accordingly for treatment planning.  On date of service, in total, I spent 30 minutes on this encounter. Patient was seen in person. _____________________________________   Eppie Gibson, MD

## 2020-02-22 ENCOUNTER — Encounter: Payer: Self-pay | Admitting: *Deleted

## 2020-02-22 NOTE — Progress Notes (Signed)
Per Dr. Benay Spice request: Email to [facility] pathology requesting PD-L1 testing on lung biopsy case #[id number] dated 02/11/20

## 2020-02-23 ENCOUNTER — Inpatient Hospital Stay: Payer: Medicaid Other | Attending: Oncology

## 2020-02-23 ENCOUNTER — Ambulatory Visit
Admission: RE | Admit: 2020-02-23 | Discharge: 2020-02-23 | Disposition: A | Discharge status: Home / Self Care | Payer: Medicaid Other | Source: Ambulatory Visit | Attending: Radiation Oncology | Admitting: Radiation Oncology

## 2020-02-23 ENCOUNTER — Other Ambulatory Visit: Payer: Self-pay

## 2020-02-23 ENCOUNTER — Inpatient Hospital Stay (HOSPITAL_BASED_OUTPATIENT_CLINIC_OR_DEPARTMENT_OTHER): Payer: Medicaid Other | Admitting: Oncology

## 2020-02-23 VITALS — BP 124/84 | HR 110 | Temp 99.2°F | Resp 15 | Ht 71.0 in

## 2020-02-23 DIAGNOSIS — Z510 Encounter for antineoplastic radiation therapy: Secondary | ICD-10-CM | POA: Diagnosis not present

## 2020-02-23 DIAGNOSIS — Z8521 Personal history of malignant neoplasm of larynx: Secondary | ICD-10-CM | POA: Diagnosis not present

## 2020-02-23 DIAGNOSIS — I4891 Unspecified atrial fibrillation: Secondary | ICD-10-CM | POA: Insufficient documentation

## 2020-02-23 DIAGNOSIS — R918 Other nonspecific abnormal finding of lung field: Secondary | ICD-10-CM

## 2020-02-23 DIAGNOSIS — F1010 Alcohol abuse, uncomplicated: Secondary | ICD-10-CM | POA: Diagnosis not present

## 2020-02-23 DIAGNOSIS — Z923 Personal history of irradiation: Secondary | ICD-10-CM | POA: Insufficient documentation

## 2020-02-23 DIAGNOSIS — J449 Chronic obstructive pulmonary disease, unspecified: Secondary | ICD-10-CM | POA: Insufficient documentation

## 2020-02-23 DIAGNOSIS — Z8701 Personal history of pneumonia (recurrent): Secondary | ICD-10-CM | POA: Insufficient documentation

## 2020-02-23 DIAGNOSIS — C3432 Malignant neoplasm of lower lobe, left bronchus or lung: Secondary | ICD-10-CM | POA: Insufficient documentation

## 2020-02-23 DIAGNOSIS — D649 Anemia, unspecified: Secondary | ICD-10-CM | POA: Diagnosis not present

## 2020-02-23 DIAGNOSIS — R64 Cachexia: Secondary | ICD-10-CM | POA: Diagnosis not present

## 2020-02-23 DIAGNOSIS — C139 Malignant neoplasm of hypopharynx, unspecified: Secondary | ICD-10-CM

## 2020-02-23 DIAGNOSIS — F17210 Nicotine dependence, cigarettes, uncomplicated: Secondary | ICD-10-CM | POA: Insufficient documentation

## 2020-02-23 LAB — CBC WITH DIFFERENTIAL (CANCER CENTER ONLY)
Abs Immature Granulocytes: 0.02 10*3/uL (ref 0.00–0.07)
Basophils Absolute: 0 10*3/uL (ref 0.0–0.1)
Basophils Relative: 0 %
Eosinophils Absolute: 0.1 10*3/uL (ref 0.0–0.5)
Eosinophils Relative: 1 %
HCT: 40.9 % (ref 39.0–52.0)
Hemoglobin: 12.8 g/dL — ABNORMAL LOW (ref 13.0–17.0)
Immature Granulocytes: 0 %
Lymphocytes Relative: 25 %
Lymphs Abs: 1.9 10*3/uL (ref 0.7–4.0)
MCH: 23.7 pg — ABNORMAL LOW (ref 26.0–34.0)
MCHC: 31.3 g/dL (ref 30.0–36.0)
MCV: 75.7 fL — ABNORMAL LOW (ref 80.0–100.0)
Monocytes Absolute: 0.7 10*3/uL (ref 0.1–1.0)
Monocytes Relative: 10 %
Neutro Abs: 4.9 10*3/uL (ref 1.7–7.7)
Neutrophils Relative %: 64 %
Platelet Count: 350 10*3/uL (ref 150–400)
RBC: 5.4 MIL/uL (ref 4.22–5.81)
RDW: 15.4 % (ref 11.5–15.5)
WBC Count: 7.6 10*3/uL (ref 4.0–10.5)
nRBC: 0 % (ref 0.0–0.2)

## 2020-02-23 MED ORDER — FLUCONAZOLE 100 MG PO TABS: 100.0000 mg | ORAL_TABLET | Freq: Every day | ORAL | 0 refills | Status: AC

## 2020-02-23 NOTE — Progress Notes (Signed)
Devon Peterson, Devon Peterson  INTERVAL HISTORY:   Devon Peterson returns for a scheduled visit.  He underwent a CT-guided biopsy of the left lung mass on 02/11/2020 revealed squamous cell carcinoma.  Devon Peterson denies dyspnea.  He has mild pain with swallowing.  No other complaint.  He reports taking a partial diet by mouth.  He continues tube feedings.  Objective:  Vital signs in last 24 hours:  Blood pressure 124/84, pulse (!) 110, temperature 99.2 F (37.3 C), temperature source Tympanic, resp. rate 15, height 5\' 11"  (1.803 m), SpO2 100 %.    HEENT: Thick white coat over the tongue Lymphatics: No cervical, supraclavicular, or axillary nodes Resp: Decreased breath sounds at the right lower chest, no respiratory distress Cardio: Regular rate and rhythm GI: No hepatosplenomegaly, nontender, left upper abdomen feeding tube site Vascular: No leg edema    Portacath/PICC-without erythema  Lab Results:  Lab Results  Component Value Date   WBC 7.6 02/23/2020   HGB 12.8 (L) 02/23/2020   HCT 40.9 02/23/2020   MCV 75.7 (L) 02/23/2020   PLT 350 02/23/2020   NEUTROABS 4.9 02/23/2020    CMP  Lab Results  Component Value Date   NA 139 02/01/2020   K 4.3 02/01/2020   CL 101 02/01/2020   CO2 25 02/01/2020   GLUCOSE 106 (H) 02/01/2020   BUN 18 02/01/2020   CREATININE 0.78 02/01/2020   CALCIUM 10.4 (H) 02/01/2020   PROT 8.5 (H) 02/01/2020   ALBUMIN 3.6 02/01/2020   AST 13 (L) 02/01/2020   ALT 9 02/01/2020   ALKPHOS 92 02/01/2020   BILITOT 0.4 02/01/2020   GFRNONAA >60 02/01/2020   GFRAA >60 11/03/2019     Medications: I have reviewed the patient's current medications.   Assessment/Plan:  1. 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 Left lower lobe of the lung mass concerning for primary neoplasm versus metastatic disease -07/07/2019 CT angiogram of the chest 2.7 cm mass in the left lower lobe of the lung -CT 08/17/2019-persistent low-attenuation lesion in the superior segment of left lower lobe with surrounding consolidation/collapse, stable from 07/07/2019, consolidation and groundglass opacity in the left lower lobe have largely resolved -08/20/2019 bronchoscopy-biopsy nondiagnostic             -CT chest 11/02/2019-, increased in size concerning for a primary lung malignancy versus a metastasis versus pulmonary abscess, improved left lower lobe aeration, COPD  -PET scan 11/26/2019-3.7 cm left lower lobe pulmonary mass, centrally necrotic and markedly hypermetabolic.  Second tiny 7 mm irregular nodular opacity in the superior segment left lower lobe shows low-level FDG accumulation.  Low-level uptake in an enlarged AP window lymph node.  Mild hypermetabolism in a normal-appearing left axillary lymph node, felt to be reactive.  Bony uptake identified in the pelvis without underlying lesions evident by CT.           -CT biopsy of left lung mass 02/11/2020-squamous cell carcinoma 3. Cachexia 4. History of aspiration pneumonia 5. Mild anemia 6. Atrial fibrillation with RVR 6. COPD 8. Alcohol abuse 9. Tobacco dependence 10. Cocaine abuse 11. Odynophagia  secondary to radiation-improved    Disposition: Devon Peterson has been diagnosed with squamous cell carcinoma involving a left lung mass.  I discussed the biopsy results with Devon Peterson.  It is likely the left lung mass represents a new primary lung  Peterson as opposed to metastasis from the head neck Peterson.  I discussed the case with Dr. Isidore Moos.  She agrees Devon Peterson is not a surgical candidate.  She recommends definitive radiation.  He is scheduled to begin radiation next week.  We will consider systemic treatment options depending on the response to radiation.  I requested PD1 testing on the lung biopsy tissue.  He will return for an office visit in approximately 6 weeks.  I recommended a course of Diflucan for oral candidiasis. Betsy Coder, MD  02/23/2020  5:04 PM

## 2020-02-23 NOTE — Progress Notes (Signed)
G-tube dressing visibly soiled. G-tube site cleaned w/NS and new dressing applied.

## 2020-02-24 ENCOUNTER — Telehealth: Payer: Self-pay | Admitting: *Deleted

## 2020-02-24 ENCOUNTER — Telehealth: Payer: Self-pay | Admitting: Oncology

## 2020-02-24 ENCOUNTER — Encounter: Payer: Self-pay | Admitting: *Deleted

## 2020-02-24 NOTE — Telephone Encounter (Signed)
PDL-1 requested. 

## 2020-02-24 NOTE — Telephone Encounter (Signed)
Sister is requesting MD call her to discuss his current medical situation and upcoming treatment need. Sister was not on ROI, but this RN called patient and he gave verbal permission to speak w/her.

## 2020-02-24 NOTE — Telephone Encounter (Signed)
Scheduled appointment per 12/21 los. Spoke to patient who is aware of appointment date and time.

## 2020-02-24 NOTE — Progress Notes (Signed)
Per Dr. Benay Spice, I updated pathology to send PDL 1 on recent bx.

## 2020-02-25 ENCOUNTER — Encounter: Payer: Self-pay | Admitting: *Deleted

## 2020-02-25 DIAGNOSIS — Z510 Encounter for antineoplastic radiation therapy: Secondary | ICD-10-CM | POA: Diagnosis not present

## 2020-02-25 DIAGNOSIS — C3432 Malignant neoplasm of lower lobe, left bronchus or lung: Secondary | ICD-10-CM | POA: Diagnosis not present

## 2020-02-25 NOTE — Progress Notes (Signed)
Dr. Benay Spice spoke w/patient's sister regarding his current treatment and status. She reports his physical therapy at SNF has stopped and asking to resume. MD signed order to resume physical therapy for strengthening and ambulation. Faxed to [facility] [phone].

## 2020-03-01 ENCOUNTER — Ambulatory Visit: Payer: Medicaid Other | Admitting: Radiation Oncology

## 2020-03-01 ENCOUNTER — Ambulatory Visit: Payer: Medicaid Other

## 2020-03-02 ENCOUNTER — Ambulatory Visit
Admission: RE | Admit: 2020-03-02 | Discharge: 2020-03-02 | Disposition: A | Discharge status: Home / Self Care | Payer: Medicaid Other | Source: Ambulatory Visit | Attending: Radiation Oncology | Admitting: Radiation Oncology

## 2020-03-02 ENCOUNTER — Other Ambulatory Visit: Payer: Self-pay

## 2020-03-02 ENCOUNTER — Inpatient Hospital Stay: Payer: Medicaid Other

## 2020-03-02 DIAGNOSIS — Z8521 Personal history of malignant neoplasm of larynx: Secondary | ICD-10-CM | POA: Diagnosis not present

## 2020-03-02 DIAGNOSIS — Z23 Encounter for immunization: Secondary | ICD-10-CM

## 2020-03-02 DIAGNOSIS — C3432 Malignant neoplasm of lower lobe, left bronchus or lung: Secondary | ICD-10-CM | POA: Diagnosis not present

## 2020-03-02 DIAGNOSIS — Z510 Encounter for antineoplastic radiation therapy: Secondary | ICD-10-CM | POA: Diagnosis not present

## 2020-03-02 NOTE — Progress Notes (Signed)
   Covid-19 Vaccination Clinic  Name:  Dezmin Kittelson    MRN: [id number] DOB: 03-05-1956  03/02/2020  Dr. Isidore Moos requested that patient receive 3rd vaccine shot since patient meets immunocompromised criteria, and may be starting systemic therapy next year. Mr. Ramdass was observed post Covid-19 immunization for 15 minutes without incident. He and his SNF were provided with Vaccine Information Sheet and instruction to access the V-Safe system.   Mr. Wambold was instructed to call 911 with any severe reactions post vaccine: Marland Kitchen Difficulty breathing  . Swelling of face and throat  . A fast heartbeat  . A bad rash all over body  . Dizziness and weakness   Immunizations Administered    Name Date Dose VIS Date Route   Pfizer COVID-19 Vaccine 03/02/2020 11:35 AM 0.3 mL 12/23/2019 Intramuscular   Manufacturer: [place]   Lot: [id number]   [place]: [id number]

## 2020-03-03 ENCOUNTER — Ambulatory Visit: Payer: Medicaid Other | Admitting: Radiation Oncology

## 2020-03-07 ENCOUNTER — Ambulatory Visit: Payer: Medicaid Other | Admitting: Radiation Oncology

## 2020-03-08 ENCOUNTER — Telehealth: Payer: Self-pay | Admitting: Radiation Oncology

## 2020-03-08 ENCOUNTER — Ambulatory Visit
Admission: RE | Admit: 2020-03-08 | Discharge: 2020-03-08 | Disposition: A | Discharge status: Home / Self Care | Payer: Medicaid Other | Source: Ambulatory Visit | Attending: Radiation Oncology | Admitting: Radiation Oncology

## 2020-03-08 ENCOUNTER — Other Ambulatory Visit: Payer: Self-pay

## 2020-03-08 DIAGNOSIS — Z510 Encounter for antineoplastic radiation therapy: Secondary | ICD-10-CM | POA: Insufficient documentation

## 2020-03-08 DIAGNOSIS — C3432 Malignant neoplasm of lower lobe, left bronchus or lung: Secondary | ICD-10-CM | POA: Insufficient documentation

## 2020-03-08 NOTE — Telephone Encounter (Signed)
Patient without any further appointments today at [facility]. Phoned Devon Peterson patient's transportation for pick up. Then, phone Sabrina at [facility] to ensure awareness of 0900 simulation appointment for tomorrow. She confirmed awareness. Wheeled patient to lobby for pick up. Patient in no distress.

## 2020-03-09 ENCOUNTER — Ambulatory Visit: Payer: Medicaid Other | Admitting: Radiation Oncology

## 2020-03-09 ENCOUNTER — Ambulatory Visit
Admission: RE | Admit: 2020-03-09 | Discharge: 2020-03-09 | Disposition: A | Discharge status: Home / Self Care | Payer: Medicaid Other | Source: Ambulatory Visit | Attending: Radiation Oncology | Admitting: Radiation Oncology

## 2020-03-10 ENCOUNTER — Emergency Department (HOSPITAL_COMMUNITY): Payer: Medicaid Other

## 2020-03-10 ENCOUNTER — Inpatient Hospital Stay (HOSPITAL_COMMUNITY)
Admission: EM | Admit: 2020-03-10 | Discharge: 2020-03-18 | DRG: 871 | Disposition: A | Discharge status: Skilled Nursing Facility | Payer: Medicaid Other | Source: Skilled Nursing Facility | Attending: Internal Medicine | Admitting: Internal Medicine

## 2020-03-10 ENCOUNTER — Inpatient Hospital Stay (HOSPITAL_COMMUNITY): Payer: Medicaid Other

## 2020-03-10 ENCOUNTER — Encounter (HOSPITAL_COMMUNITY): Payer: Self-pay | Admitting: Emergency Medicine

## 2020-03-10 ENCOUNTER — Other Ambulatory Visit: Payer: Self-pay

## 2020-03-10 ENCOUNTER — Ambulatory Visit: Payer: Medicaid Other | Admitting: Radiation Oncology

## 2020-03-10 DIAGNOSIS — Q315 Congenital laryngomalacia: Secondary | ICD-10-CM | POA: Diagnosis not present

## 2020-03-10 DIAGNOSIS — Z931 Gastrostomy status: Secondary | ICD-10-CM | POA: Diagnosis not present

## 2020-03-10 DIAGNOSIS — B370 Candidal stomatitis: Secondary | ICD-10-CM | POA: Diagnosis present

## 2020-03-10 DIAGNOSIS — Z8521 Personal history of malignant neoplasm of larynx: Secondary | ICD-10-CM

## 2020-03-10 DIAGNOSIS — J9601 Acute respiratory failure with hypoxia: Secondary | ICD-10-CM | POA: Diagnosis present

## 2020-03-10 DIAGNOSIS — R0902 Hypoxemia: Secondary | ICD-10-CM

## 2020-03-10 DIAGNOSIS — D630 Anemia in neoplastic disease: Secondary | ICD-10-CM | POA: Diagnosis present

## 2020-03-10 DIAGNOSIS — J690 Pneumonitis due to inhalation of food and vomit: Secondary | ICD-10-CM | POA: Diagnosis present

## 2020-03-10 DIAGNOSIS — R531 Weakness: Secondary | ICD-10-CM

## 2020-03-10 DIAGNOSIS — Z888 Allergy status to other drugs, medicaments and biological substances status: Secondary | ICD-10-CM

## 2020-03-10 DIAGNOSIS — C7802 Secondary malignant neoplasm of left lung: Secondary | ICD-10-CM | POA: Diagnosis not present

## 2020-03-10 DIAGNOSIS — C3432 Malignant neoplasm of lower lobe, left bronchus or lung: Secondary | ICD-10-CM | POA: Diagnosis present

## 2020-03-10 DIAGNOSIS — R059 Cough, unspecified: Secondary | ICD-10-CM | POA: Diagnosis not present

## 2020-03-10 DIAGNOSIS — J90 Pleural effusion, not elsewhere classified: Secondary | ICD-10-CM | POA: Diagnosis present

## 2020-03-10 DIAGNOSIS — R54 Age-related physical debility: Secondary | ICD-10-CM | POA: Diagnosis present

## 2020-03-10 DIAGNOSIS — J9811 Atelectasis: Secondary | ICD-10-CM | POA: Diagnosis present

## 2020-03-10 DIAGNOSIS — R061 Stridor: Secondary | ICD-10-CM | POA: Diagnosis present

## 2020-03-10 DIAGNOSIS — E872 Acidosis: Secondary | ICD-10-CM | POA: Diagnosis present

## 2020-03-10 DIAGNOSIS — N3000 Acute cystitis without hematuria: Secondary | ICD-10-CM

## 2020-03-10 DIAGNOSIS — R1313 Dysphagia, pharyngeal phase: Secondary | ICD-10-CM | POA: Diagnosis present

## 2020-03-10 DIAGNOSIS — Y9289 Other specified places as the place of occurrence of the external cause: Secondary | ICD-10-CM | POA: Diagnosis not present

## 2020-03-10 DIAGNOSIS — I4892 Unspecified atrial flutter: Secondary | ICD-10-CM | POA: Diagnosis present

## 2020-03-10 DIAGNOSIS — C7801 Secondary malignant neoplasm of right lung: Secondary | ICD-10-CM | POA: Diagnosis present

## 2020-03-10 DIAGNOSIS — X58XXXA Exposure to other specified factors, initial encounter: Secondary | ICD-10-CM | POA: Diagnosis present

## 2020-03-10 DIAGNOSIS — T17890A Other foreign object in other parts of respiratory tract causing asphyxiation, initial encounter: Secondary | ICD-10-CM | POA: Diagnosis not present

## 2020-03-10 DIAGNOSIS — C329 Malignant neoplasm of larynx, unspecified: Secondary | ICD-10-CM | POA: Diagnosis not present

## 2020-03-10 DIAGNOSIS — C3490 Malignant neoplasm of unspecified part of unspecified bronchus or lung: Secondary | ICD-10-CM

## 2020-03-10 DIAGNOSIS — Z7189 Other specified counseling: Secondary | ICD-10-CM

## 2020-03-10 DIAGNOSIS — J440 Chronic obstructive pulmonary disease with acute lower respiratory infection: Secondary | ICD-10-CM | POA: Diagnosis present

## 2020-03-10 DIAGNOSIS — Y842 Radiological procedure and radiotherapy as the cause of abnormal reaction of the patient, or of later complication, without mention of misadventure at the time of the procedure: Secondary | ICD-10-CM | POA: Diagnosis present

## 2020-03-10 DIAGNOSIS — Z681 Body mass index (BMI) 19 or less, adult: Secondary | ICD-10-CM

## 2020-03-10 DIAGNOSIS — E43 Unspecified severe protein-calorie malnutrition: Secondary | ICD-10-CM | POA: Diagnosis present

## 2020-03-10 DIAGNOSIS — Z20822 Contact with and (suspected) exposure to covid-19: Secondary | ICD-10-CM | POA: Diagnosis present

## 2020-03-10 DIAGNOSIS — E860 Dehydration: Secondary | ICD-10-CM | POA: Diagnosis present

## 2020-03-10 DIAGNOSIS — J181 Lobar pneumonia, unspecified organism: Secondary | ICD-10-CM | POA: Diagnosis not present

## 2020-03-10 DIAGNOSIS — R64 Cachexia: Secondary | ICD-10-CM | POA: Diagnosis present

## 2020-03-10 DIAGNOSIS — F1010 Alcohol abuse, uncomplicated: Secondary | ICD-10-CM | POA: Diagnosis present

## 2020-03-10 DIAGNOSIS — E871 Hypo-osmolality and hyponatremia: Secondary | ICD-10-CM | POA: Diagnosis not present

## 2020-03-10 DIAGNOSIS — C3400 Malignant neoplasm of unspecified main bronchus: Secondary | ICD-10-CM | POA: Diagnosis not present

## 2020-03-10 DIAGNOSIS — Z87891 Personal history of nicotine dependence: Secondary | ICD-10-CM

## 2020-03-10 DIAGNOSIS — Z8249 Family history of ischemic heart disease and other diseases of the circulatory system: Secondary | ICD-10-CM

## 2020-03-10 DIAGNOSIS — J189 Pneumonia, unspecified organism: Secondary | ICD-10-CM | POA: Diagnosis present

## 2020-03-10 DIAGNOSIS — R0600 Dyspnea, unspecified: Secondary | ICD-10-CM

## 2020-03-10 DIAGNOSIS — N390 Urinary tract infection, site not specified: Secondary | ICD-10-CM | POA: Diagnosis not present

## 2020-03-10 DIAGNOSIS — I480 Paroxysmal atrial fibrillation: Secondary | ICD-10-CM | POA: Diagnosis present

## 2020-03-10 DIAGNOSIS — A419 Sepsis, unspecified organism: Secondary | ICD-10-CM | POA: Diagnosis present

## 2020-03-10 DIAGNOSIS — F1410 Cocaine abuse, uncomplicated: Secondary | ICD-10-CM | POA: Diagnosis present

## 2020-03-10 DIAGNOSIS — Z515 Encounter for palliative care: Secondary | ICD-10-CM | POA: Diagnosis not present

## 2020-03-10 DIAGNOSIS — J9600 Acute respiratory failure, unspecified whether with hypoxia or hypercapnia: Secondary | ICD-10-CM | POA: Diagnosis not present

## 2020-03-10 DIAGNOSIS — E876 Hypokalemia: Secondary | ICD-10-CM | POA: Diagnosis not present

## 2020-03-10 DIAGNOSIS — E870 Hyperosmolality and hypernatremia: Secondary | ICD-10-CM | POA: Diagnosis not present

## 2020-03-10 DIAGNOSIS — R6520 Severe sepsis without septic shock: Secondary | ICD-10-CM

## 2020-03-10 DIAGNOSIS — J9621 Acute and chronic respiratory failure with hypoxia: Secondary | ICD-10-CM | POA: Diagnosis not present

## 2020-03-10 DIAGNOSIS — Z79899 Other long term (current) drug therapy: Secondary | ICD-10-CM

## 2020-03-10 HISTORY — DX: Malignant (primary) neoplasm, unspecified: C80.1

## 2020-03-10 LAB — BASIC METABOLIC PANEL
Anion gap: 18 — ABNORMAL HIGH (ref 5–15)
Anion gap: 11 (ref 5–15)
BUN: 49 mg/dL — ABNORMAL HIGH (ref 8–23)
BUN: 38 mg/dL — ABNORMAL HIGH (ref 8–23)
CO2: 31 mmol/L (ref 22–32)
CO2: 32 mmol/L (ref 22–32)
Calcium: 9.9 mg/dL (ref 8.9–10.3)
Calcium: 8.9 mg/dL (ref 8.9–10.3)
Chloride: 110 mmol/L (ref 98–111)
Chloride: 112 mmol/L — ABNORMAL HIGH (ref 98–111)
Creatinine, Ser: 1.08 mg/dL (ref 0.61–1.24)
Creatinine, Ser: 0.93 mg/dL (ref 0.61–1.24)
GFR, Estimated: >60 mL/min (ref >60)
GFR, Estimated: >60 mL/min (ref >60)
Glucose, Bld: 134 mg/dL — ABNORMAL HIGH (ref 70–99)
Glucose, Bld: 124 mg/dL — ABNORMAL HIGH (ref 70–99)
Potassium: 4.3 mmol/L (ref 3.5–5.1)
Potassium: 4.8 mmol/L (ref 3.5–5.1)
Sodium: 159 mmol/L — ABNORMAL HIGH (ref 135–145)
Sodium: 155 mmol/L — ABNORMAL HIGH (ref 135–145)

## 2020-03-10 LAB — CBC
HCT: 39.2 % (ref 39.0–52.0)
Hemoglobin: 11.9 g/dL — ABNORMAL LOW (ref 13.0–17.0)
MCH: 23.6 pg — ABNORMAL LOW (ref 26.0–34.0)
MCHC: 30.4 g/dL (ref 30.0–36.0)
MCV: 77.6 fL — ABNORMAL LOW (ref 80.0–100.0)
Platelets: 350 10*3/uL (ref 150–400)
RBC: 5.05 MIL/uL (ref 4.22–5.81)
RDW: 16.2 % — ABNORMAL HIGH (ref 11.5–15.5)
WBC: 12.5 10*3/uL — ABNORMAL HIGH (ref 4.0–10.5)
nRBC: 0.2 % (ref 0.0–0.2)

## 2020-03-10 LAB — URINALYSIS, ROUTINE W REFLEX MICROSCOPIC
Bilirubin Urine: NEGATIVE
Glucose, UA: NEGATIVE mg/dL
Hgb urine dipstick: NEGATIVE
Ketones, ur: NEGATIVE mg/dL
Nitrite: POSITIVE — AB
Protein, ur: NEGATIVE mg/dL
Specific Gravity, Urine: 1.018 (ref 1.005–1.030)
pH: 6 (ref 5.0–8.0)

## 2020-03-10 LAB — HEPATIC FUNCTION PANEL
ALT: 18 U/L (ref 0–44)
AST: 28 U/L (ref 15–41)
Albumin: 3.3 g/dL — ABNORMAL LOW (ref 3.5–5.0)
Alkaline Phosphatase: 108 U/L (ref 38–126)
Bilirubin, Direct: <0.1 mg/dL (ref 0.0–0.2)
Total Bilirubin: 0.4 mg/dL (ref 0.3–1.2)
Total Protein: 9.7 g/dL — ABNORMAL HIGH (ref 6.5–8.1)

## 2020-03-10 LAB — RESP PANEL BY RT-PCR (FLU A&B, COVID) ARPGX2
Influenza A by PCR: NEGATIVE
Influenza B by PCR: NEGATIVE
SARS Coronavirus 2 by RT PCR: NEGATIVE

## 2020-03-10 LAB — MRSA PCR SCREENING: MRSA by PCR: NEGATIVE

## 2020-03-10 LAB — LACTIC ACID, PLASMA
Lactic Acid, Venous: 2.5 mmol/L (ref 0.5–1.9)
Lactic Acid, Venous: 2.1 mmol/L (ref 0.5–1.9)

## 2020-03-10 LAB — APTT: aPTT: 30 seconds (ref 24–36)

## 2020-03-10 LAB — TROPONIN I (HIGH SENSITIVITY)
Troponin I (High Sensitivity): 11 ng/L (ref <18)
Troponin I (High Sensitivity): 10 ng/L (ref <18)

## 2020-03-10 LAB — D-DIMER, QUANTITATIVE: D-Dimer, Quant: 2.71 ug/mL-FEU — ABNORMAL HIGH (ref 0.00–0.50)

## 2020-03-10 LAB — PROTIME-INR
INR: 1.1 (ref 0.8–1.2)
Prothrombin Time: 13.2 seconds (ref 11.4–15.2)

## 2020-03-10 MED ORDER — LACTATED RINGERS IV SOLN: INTRAVENOUS | Status: DC

## 2020-03-10 MED ORDER — IOHEXOL 350 MG/ML SOLN
100.0000 mL | Freq: Once | INTRAVENOUS | Status: AC | PRN
  Administered 2020-03-10: 100 mL via INTRAVENOUS

## 2020-03-10 MED ORDER — METRONIDAZOLE IN NACL 5-0.79 MG/ML-% IV SOLN
500.0000 mg | Freq: Once | INTRAVENOUS | Status: AC
  Administered 2020-03-10: 500 mg via INTRAVENOUS
  Filled 2020-03-10: qty 100

## 2020-03-10 MED ORDER — LACTATED RINGERS IV BOLUS (SEPSIS)
500.0000 mL | Freq: Once | INTRAVENOUS | Status: AC
  Administered 2020-03-10: 500 mL via INTRAVENOUS

## 2020-03-10 MED ORDER — IPRATROPIUM-ALBUTEROL 20-100 MCG/ACT IN AERS
1.0000 | INHALATION_SPRAY | Freq: Three times a day (TID) | RESPIRATORY_TRACT | Status: DC
  Administered 2020-03-10 – 2020-03-13 (×9): 1 via RESPIRATORY_TRACT
  Filled 2020-03-10 (×2): qty 4

## 2020-03-10 MED ORDER — VANCOMYCIN HCL IN DEXTROSE 1-5 GM/200ML-% IV SOLN
1000.0000 mg | Freq: Once | INTRAVENOUS | Status: AC
  Administered 2020-03-10: 1000 mg via INTRAVENOUS
  Filled 2020-03-10: qty 200

## 2020-03-10 MED ORDER — SODIUM CHLORIDE 0.9 % IV SOLN: INTRAVENOUS | Status: DC

## 2020-03-10 MED ORDER — LACTATED RINGERS IV BOLUS (SEPSIS)
1000.0000 mL | Freq: Once | INTRAVENOUS | Status: AC
  Administered 2020-03-10: 1000 mL via INTRAVENOUS

## 2020-03-10 MED ORDER — PIPERACILLIN-TAZOBACTAM 3.375 G IVPB: 3.3750 g | Freq: Once | INTRAVENOUS | Status: DC

## 2020-03-10 MED ORDER — VANCOMYCIN HCL IN DEXTROSE 1-5 GM/200ML-% IV SOLN
1000.0000 mg | INTRAVENOUS | Status: DC
  Administered 2020-03-10: 1000 mg via INTRAVENOUS
  Filled 2020-03-10: qty 200

## 2020-03-10 MED ORDER — LACTATED RINGERS IV BOLUS (SEPSIS)
250.0000 mL | Freq: Once | INTRAVENOUS | Status: AC
  Administered 2020-03-10: 250 mL via INTRAVENOUS

## 2020-03-10 MED ORDER — SODIUM CHLORIDE 0.9 % IV SOLN
2.0000 g | Freq: Once | INTRAVENOUS | Status: AC
  Administered 2020-03-10: 2 g via INTRAVENOUS
  Filled 2020-03-10: qty 2

## 2020-03-10 MED ORDER — SODIUM CHLORIDE 0.9 % IV SOLN
2.0000 g | Freq: Two times a day (BID) | INTRAVENOUS | Status: DC
  Administered 2020-03-10: 2 g via INTRAVENOUS
  Filled 2020-03-10: qty 2

## 2020-03-10 NOTE — Progress Notes (Signed)
Pharmacy Antibiotic Note  Devon Peterson is a 65 y.o. male admitted on 03/10/2020 with PNA and UTI.  Pharmacy has been consulted for vancomycin and cefepime dosing.  Plan:  Vancomycin 1000 mg IV q24 hr (est AUC 539 based on SCr 0.93 [TBW CrCl]; Vd 0.72)  Measure vancomycin AUC at steady state as indicated  SCr q48 while on vanc  Cefepime 2 g IV q12 hr   Weight: 50 kg (110 lb 3.7 oz)  Temp (24hrs), Avg:101.1 F (38.4 C), Min:101.1 F (38.4 C), Max:101.1 F (38.4 C)  Recent Labs  Lab 03/10/20 1007 03/10/20 1058 03/10/20 1352 03/10/20 1753  WBC 12.5*  --   --   --   CREATININE 1.08  --   --  0.93  LATICACIDVEN  --  2.5* 2.1*  --     Estimated Creatinine Clearance: 56.8 mL/min (by C-G formula based on SCr of 0.93 mg/dL).    Allergies  Allergen Reactions  . Albumin (Human) Itching    May be able to handle at low rates.    Antimicrobials this admission: 1/6 vanc >>  1/6 cefepime >>   Dose adjustments this admission: n/a  Microbiology results: 1/6 BCx: IP 1/6 UCx: sent  1/6 MRSA PCR: ordered  Thank you for allowing pharmacy to be a part of this patient's care.  Bobbye Petti A 03/10/2020 7:42 PM

## 2020-03-10 NOTE — ED Triage Notes (Signed)
Per [facility], coming from [facility], RN at facility states SOB-placed of 2L [place]-history of lung cancer-just had radiation yesterday-covid test was negative

## 2020-03-10 NOTE — H&P (Addendum)
History and Physical    Damarco Keysor [id number] DOB: 03-Sep-1955 DOA: 03/10/2020  PCP: Patient, No Pcp Per  Patient coming from: [facility]  Chief Complaint: Dyspnea  HPI: Hristopher Missildine is a 65 y.o. male with medical history significant of metastatic lung cancer. Presenting with dyspnea. Difficult to get information from him. History is from chart review. Patient is currently with [facility]. He has a history of lung CA and just started a round of XRT yesterday. He was found to be dyspneic and hypoxic this AM. He was placed on 2L [place] and improved. Apparently no prior history of O2 use. He has apparently had a couple of days of cough, increased sputum production, and fevers. The facility became concerned and sent him to the ED.   ED Course: He was found to be septic. UA was dirty. Chest imaging showed wide spread CA w/ an area that was suspicious for PNA. He was started on broad spec abx. His COVID was negative. TRH was called for admission.   Review of Systems:  Denies CP, N, V, ab pain, hemoptysis. Review of systems is otherwise negative for all not mentioned in HPI.   PMHx Past Medical History:  Diagnosis Date  . Cancer ([place])   . COPD (chronic obstructive pulmonary disease) ([place])   . Medical history non-contributory     PSHx Past Surgical History:  Procedure Laterality Date  . BIOPSY  08/20/2019   Procedure: BIOPSY;  Surgeon: Chesley Mires, MD;  Location: [facility];  Service: Endoscopy;;  . BRONCHIAL WASHINGS  08/20/2019   Procedure: BRONCHIAL WASHINGS;  Surgeon: Chesley Mires, MD;  Location: [facility];  Service: Endoscopy;;  LLL BAL  . DIRECT LARYNGOSCOPY N/A 07/09/2019   Procedure: DIRECT LARYNGOSCOPY WITH BIOPSY;  Surgeon: Jerrell Belfast, MD;  Location: [facility];  Service: ENT;  Laterality: N/A;  . IR FLUORO RM 30-60 MIN  07/13/2019  . IR GASTROSTOMY TUBE MOD SED  07/14/2019  . IR REPLACE G-TUBE SIMPLE WO FLUORO  10/09/2019  . NO PAST SURGERIES    . TRACHEOSTOMY TUBE  PLACEMENT N/A 07/09/2019   Procedure: TRACHEOSTOMY;  Surgeon: Jerrell Belfast, MD;  Location: [facility];  Service: ENT;  Laterality: N/A;  . VIDEO BRONCHOSCOPY Left 08/20/2019   Procedure: VIDEO BRONCHOSCOPY WITH FLUORO;  Surgeon: Chesley Mires, MD;  Location: [facility];  Service: Endoscopy;  Laterality: Left;    SocHx  reports that he quit smoking about 8 months ago. His smoking use included cigarettes. He has a 39.00 pack-year smoking history. He has never used smokeless tobacco. He reports current alcohol use of about 24.0 standard drinks of alcohol per week. He reports previous drug use.  Allergies  Allergen Reactions  . Albumin (Human) Itching    May be able to handle at low rates.    FamHx Family History  Problem Relation Age of Onset  . CAD Father     Prior to Admission medications   Medication Sig Start Date End Date Taking? Authorizing Provider  Amino Acids-Protein Hydrolys (FEEDING SUPPLEMENT, PRO-STAT SUGAR FREE 64,) LIQD Place 30 mLs into feeding tube 2 (two) times daily. 08/28/19  Yes Patrecia Pour, MD  chlorhexidine gluconate, MEDLINE KIT, (PERIDEX) 0.12 % solution 15 mLs by Mouth Rinse route 2 (two) times daily. 08/28/19  Yes Patrecia Pour, MD  ferrous sulfate 325 (65 FE) MG tablet Take 1 tablet (325 mg total) by mouth daily. 08/28/19 08/27/20 Yes Patrecia Pour, MD  metoprolol tartrate (LOPRESSOR) 25 MG tablet Take 0.5  tablets (12.5 mg total) by mouth 2 (two) times daily. 08/28/19 02/09/20 Yes Patrecia Pour, MD  Nutritional Supplements (ISOSOURCE 1.5 CAL) LIQD Place 237 mLs into feeding tube 5 (five) times daily. Patient taking differently: Place 60 mLs into feeding tube See admin instructions. Give 60 ml per hour in feeding tube for 12 hours a day (1900 - 0700) 08/28/19  Yes Patrecia Pour, MD  nystatin (MYCOSTATIN) 100000 UNIT/ML suspension Take 5 mLs by mouth 4 (four) times daily as needed (thrush).   Yes [provider]  OVER THE COUNTER MEDICATION Take 120 mLs by mouth  2 (two) times daily. 175m resource twice daily with med pass   Yes [provider]  oxycodone (OXY-IR) 5 MG capsule Take 5 mg by mouth every 8 (eight) hours as needed for pain.    Yes [provider]  pantoprazole (PROTONIX) 40 MG tablet Take 1 tablet (40 mg total) by mouth daily. 08/28/19 08/27/20 Yes GPatrecia Pour MD  polyethylene glycol (MIRALAX / GLYCOLAX) 17 g packet Take 17 g by mouth daily. 08/28/19  Yes GPatrecia Pour MD  senna (SENOKOT) 8.6 MG TABS tablet Take 2 tablets (17.2 mg total) by mouth daily. 08/28/19  Yes GPatrecia Pour MD  sertraline (ZOLOFT) 50 MG tablet Take 1 tablet (50 mg total) by mouth daily. 08/28/19 08/27/20 Yes GPatrecia Pour MD  thiamine 100 MG tablet Take 1 tablet (100 mg total) by mouth daily. 08/28/19  Yes GPatrecia Pour MD    Physical Exam: Vitals:   03/10/20 1430 03/10/20 1500 03/10/20 1530 03/10/20 1549  BP: 113/78 104/75 110/76   Pulse: (!) 110  (!) 104 (!) 102  Resp: (!) 26 (!) 24 (!) 27   Temp:      TempSrc:      SpO2: 95%  94% 100%  Weight:        General: 65y.o. 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 Liver Function Tests: Recent Labs  Lab 03/10/20 1055  AST 28  ALT 18  ALKPHOS 108  BILITOT 0.4  PROT 9.7*  ALBUMIN 3.3*   No results for input(s): LIPASE, AMYLASE in the last 168 hours. No results for input(s): AMMONIA in the last 168 hours. Coagulation Profile: Recent Labs  Lab 03/10/20 1055  INR 1.1   Cardiac Enzymes: No results for input(s): CKTOTAL, CKMB, CKMBINDEX, TROPONINI in the last 168 hours. BNP (last 3 results) No results for input(s): PROBNP in the last 8760 hours. HbA1C: No results for  input(s): HGBA1C in the last 72 hours. CBG: No results for input(s): GLUCAP in the last 168 hours. Lipid Profile: No results for input(s): CHOL, HDL, LDLCALC, TRIG, CHOLHDL, LDLDIRECT in the last 72 hours. Thyroid Function Tests: No results for input(s): TSH, T4TOTAL, FREET4, T3FREE, THYROIDAB in the last 72 hours. Anemia Panel: No results for input(s): VITAMINB12, FOLATE, FERRITIN, TIBC, IRON, RETICCTPCT in the last 72 hours. Urine analysis:    Component Value Date/Time   COLORURINE YELLOW 03/10/2020 1400   APPEARANCEUR HAZY (A) 03/10/2020 1400   LABSPEC 1.018 03/10/2020 1400   PHURINE 6.0 03/10/2020 1400   GLUCOSEU NEGATIVE 03/10/2020 1400   HGBUR NEGATIVE 03/10/2020 1400   BILIRUBINUR NEGATIVE 03/10/2020 1400   KETONESUR NEGATIVE 03/10/2020 1400   PROTEINUR NEGATIVE 03/10/2020 1400   UROBILINOGEN 1.0 10/28/2008 0458   NITRITE POSITIVE (A) 03/10/2020 1400   LEUKOCYTESUR TRACE (A) 03/10/2020 1400    Radiological Exams on Admission: DG Chest 2 View  Result Date: 03/10/2020 CLINICAL DATA:  History of lung carcinoma.  Shortness of breath EXAM: CHEST - 2 VIEW COMPARISON:  Chest CT November 02, 2019; chest radiograph August 20, 2019. PET-CT November 26, 2019 FINDINGS: There is opacity in the posterior left base at the site  of known mass. Mass is ill-defined by current radiographic examination with suspected surrounding ill-defined pneumonitis. Elsewhere no edema or airspace opacity evident. Heart size and pulmonary vascular normal. No adenopathy. No bone lesions. IMPRESSION: Airspace opacity posterior left base, likely due to combination of mass and surrounding pneumonitis. Lungs elsewhere clear. Heart size normal. No adenopathy evident. Electronically Signed   By: Lowella Grip III M.D.   On: 03/10/2020 10:26   Assessment/Plan Sepsis secondary to UTI and PNA     - admit to inpt, progressive     - continue vanc, cefepime, flagyl for now     - likely combo of UTI and PNA     - continue LR     - follow up UCx, Bld Cx     - immunocompromised d/t recent radiation  Hx of lung CA     - followed by Dr. Benay Spice; he is aware patient is in the hospital     - continue outpt follow up     - will also get palliative care consult  Cachexia  Feeding Tube use Severe protein-calorie malnutrition     - consult dietician for nutrition assessment and tube feeds  Hypernatremia     - secondary to dehydration, poor PO intake     - change LR to NS, follow q6h BMP  Microcytic anemia     - no evidence of bleed; follow  DVT prophylaxis: lovenox  Code Status: FULL  Family Communication: Updated sisted (E. Mcauley-Evans by phone)   Consults called: [facility] notified of patient arrival; Palliative care consulted   Status is: Inpatient  Remains inpatient appropriate because:Inpatient level of care appropriate due to severity of illness   Dispo: The patient is from: Home              Anticipated d/c is to: Home              Anticipated d/c date is: 3 days              Patient currently is not medically stable to d/c.  Jonnie Finner DO [facility]  If 7PM-7AM, please contact night-coverage [email or web address]  03/10/2020, 3:57 PM

## 2020-03-10 NOTE — Sepsis Progress Note (Signed)
eLink monitoring this Code Sepsis. 

## 2020-03-10 NOTE — Progress Notes (Signed)
A consult was received from an ED physician for vancomycin & cefepime per pharmacy dosing.  The patient's profile has been reviewed for ht/wt/allergies/indication/available labs.   A one time order has been placed for cefepime 2 gm, Flagyl 500 mg, Vancomycin 1000 mg .    Further antibiotics/pharmacy consults should be ordered by admitting physician if indicated.                       Thank you, Eudelia Bunch, Pharm.D 03/10/2020 11:08 AM

## 2020-03-10 NOTE — ED Notes (Signed)
Sister, Devon Peterson would like an update about her brother. [phone].

## 2020-03-10 NOTE — ED Notes (Signed)
Georgina Pillion, son, [id number] would like to be kept informed about his fathers progress.

## 2020-03-10 NOTE — ED Provider Notes (Signed)
Devon Peterson DEPT Provider Note   CSN: [id number] Arrival date & time: 03/10/20  4098     History Chief Complaint  Patient presents with  . Shortness of Breath    Devon Peterson is a 65 y.o. male.  The history is provided by the patient and medical records. No language interpreter was used.  Shortness of Breath Severity:  Moderate Onset quality:  Gradual Timing:  Constant Progression:  Worsening Chronicity:  New Context: URI   Relieved by:  Nothing Worsened by:  Coughing Associated symptoms: cough, fever and sputum production   Associated symptoms: no abdominal pain, no chest pain, no diaphoresis, no headaches, no vomiting and no wheezing   Risk factors: hx of cancer   Risk factors: no hx of PE/DVT        Past Medical History:  Diagnosis Date  . Cancer ([place])   . COPD (chronic obstructive pulmonary disease) ([place])   . Medical history non-contributory     Patient Active Problem List   Diagnosis Date Noted  . Malignant neoplasm of lower lobe of left lung ([place]) 02/19/2020  . Acute respiratory insufficiency   . Squamous cell carcinoma of neck   . Palliative care encounter   . Tracheostomy care ([place])   . 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 Lung mass   . Neck mass   . Pressure injury of skin 07/08/2019  . Aspiration pneumonia ([place]) 07/07/2019  . Dysphagia 07/07/2019  . Severe protein-calorie malnutrition ([place]) 07/07/2019  . Alcohol use 07/07/2019  . Sepsis ([place]) 04/17/2019  . COPD exacerbation ([place])   . Lactic acidosis   . Tobacco abuse   . Alcohol abuse     Past Surgical History:  Procedure Laterality Date  . BIOPSY  08/20/2019   Procedure: BIOPSY;  Surgeon: Chesley Mires, MD;  Location: [facility];  Service: Endoscopy;;  . BRONCHIAL WASHINGS  08/20/2019   Procedure: BRONCHIAL WASHINGS;  Surgeon: Chesley Mires, MD;  Location: [facility];  Service: Endoscopy;;  LLL BAL  . DIRECT LARYNGOSCOPY N/A 07/09/2019   Procedure: DIRECT  LARYNGOSCOPY WITH BIOPSY;  Surgeon: Jerrell Belfast, MD;  Location: [facility];  Service: ENT;  Laterality: N/A;  . IR FLUORO RM 30-60 MIN  07/13/2019  . IR GASTROSTOMY TUBE MOD SED  07/14/2019  . IR REPLACE G-TUBE SIMPLE WO FLUORO  10/09/2019  . NO PAST SURGERIES    . TRACHEOSTOMY TUBE PLACEMENT N/A 07/09/2019   Procedure: TRACHEOSTOMY;  Surgeon: Jerrell Belfast, MD;  Location: [facility];  Service: ENT;  Laterality: N/A;  . VIDEO BRONCHOSCOPY Left 08/20/2019   Procedure: VIDEO BRONCHOSCOPY WITH FLUORO;  Surgeon: Chesley Mires, MD;  Location: [facility];  Service: Endoscopy;  Laterality: Left;       Family History  Problem Relation Age of Onset  . CAD Father     Social History   Tobacco Use  . Smoking status: Former Smoker    Packs/day: 1.00    Years: 39.00    Pack years: 39.00    Types: Cigarettes    Quit date: 07/07/2019    Years since quitting: 0.6  . Smokeless tobacco: Never Used  Substance Use Topics  . Alcohol use: Yes    Alcohol/week: 24.0 standard drinks    Types: 24 Cans of beer per week  . Drug use: Not Currently    Home Medications Prior to Admission medications   Medication Sig Start Date End Date Taking? Authorizing Provider  Amino Acids-Protein Hydrolys (FEEDING SUPPLEMENT, PRO-STAT SUGAR FREE 64,) LIQD  Place 30 mLs into feeding tube 2 (two) times daily. 08/28/19   Patrecia Pour, MD  chlorhexidine gluconate, MEDLINE KIT, (PERIDEX) 0.12 % solution 15 mLs by Mouth Rinse route 2 (two) times daily. 08/28/19   Patrecia Pour, MD  ferrous sulfate 325 (65 FE) MG tablet Take 1 tablet (325 mg total) by mouth daily. 08/28/19 08/27/20  Patrecia Pour, MD  metoprolol tartrate (LOPRESSOR) 25 MG tablet Take 0.5 tablets (12.5 mg total) by mouth 2 (two) times daily. 08/28/19 02/09/20  Patrecia Pour, MD  Nutritional Supplements (ISOSOURCE 1.5 CAL) LIQD Place 237 mLs into feeding tube 5 (five) times daily. Patient taking differently: Place 60 mLs into feeding tube See admin instructions. Give 60  ml per hour in feeding tube for 12 hours a day (1900 - 0700) 08/28/19   Patrecia Pour, MD  nystatin (MYCOSTATIN) 100000 UNIT/ML suspension Take 5 mLs by mouth 4 (four) times daily as needed (thrush). Patient not taking: Reported on 02/23/2020    [provider]  oxycodone (OXY-IR) 5 MG capsule Take 5 mg by mouth every 8 (eight) hours as needed for pain.     [provider]  pantoprazole (PROTONIX) 40 MG tablet Take 1 tablet (40 mg total) by mouth daily. 08/28/19 08/27/20  Patrecia Pour, MD  polyethylene glycol (MIRALAX / GLYCOLAX) 17 g packet Take 17 g by mouth daily. 08/28/19   Patrecia Pour, MD  senna (SENOKOT) 8.6 MG TABS tablet Take 2 tablets (17.2 mg total) by mouth daily. 08/28/19   Patrecia Pour, MD  sertraline (ZOLOFT) 50 MG tablet Take 1 tablet (50 mg total) by mouth daily. 08/28/19 08/27/20  Patrecia Pour, MD  thiamine 100 MG tablet Take 1 tablet (100 mg total) by mouth daily. 08/28/19   Patrecia Pour, MD    Allergies    Albumin (human)  Review of Systems   Review of Systems  Constitutional: Positive for chills, fatigue and fever. Negative for diaphoresis.  HENT: Positive for congestion.   Eyes: Negative for visual disturbance.  Respiratory: Positive for cough, sputum production, chest tightness and shortness of breath. Negative for wheezing.   Cardiovascular: Negative for chest pain, palpitations and leg swelling.  Gastrointestinal: Negative for abdominal pain, constipation, diarrhea, nausea and vomiting.  Genitourinary: Negative for frequency.  Musculoskeletal: Negative for back pain.  Neurological: Negative for headaches.  Psychiatric/Behavioral: Negative for agitation.  All other systems reviewed and are negative.   Physical Exam Updated Vital Signs BP 116/86 (BP Location: Left Arm)   Pulse (!) 132   Temp (!) 101.1 F (38.4 C) (Oral)   Resp 18   SpO2 94%   Physical Exam Vitals and nursing note reviewed.  Constitutional:      General: He is not in acute  distress.    Appearance: He is well-developed and well-nourished. He is not ill-appearing, toxic-appearing or diaphoretic.  HENT:     Head: Normocephalic and atraumatic.     Mouth/Throat:     Pharynx: No pharyngeal swelling or oropharyngeal exudate.  Eyes:     Conjunctiva/sclera: Conjunctivae normal.     Pupils: Pupils are equal, round, and reactive to light.  Cardiovascular:     Rate and Rhythm: Regular rhythm. Tachycardia present.  No extrasystoles are present.    Heart sounds: No murmur heard.   Pulmonary:     Effort: Pulmonary effort is normal. Tachypnea present. No respiratory distress.     Breath sounds: Rhonchi present. No decreased breath sounds, wheezing or  rales.  Chest:     Chest wall: No tenderness.  Abdominal:     Palpations: Abdomen is soft.     Tenderness: There is no abdominal tenderness.  Musculoskeletal:        General: No edema.     Cervical back: Neck supple.     Right lower leg: No tenderness. No edema.     Left lower leg: No tenderness. No edema.  Skin:    General: Skin is warm and dry.     Capillary Refill: Capillary refill takes less than 2 seconds.     Findings: No erythema.  Neurological:     General: No focal deficit present.     Mental Status: He is alert.  Psychiatric:        Mood and Affect: Mood and affect and mood normal.     ED Results / Procedures / Treatments   Labs (all labs ordered are listed, but only abnormal results are displayed) Labs Reviewed  BASIC METABOLIC PANEL - Abnormal; Notable for the following components:      Result Value   Sodium 159 (*)    Glucose, Bld 134 (*)    BUN 49 (*)    Anion gap 18 (*)    All other components within normal limits  CBC - Abnormal; Notable for the following components:   WBC 12.5 (*)    Hemoglobin 11.9 (*)    MCV 77.6 (*)    MCH 23.6 (*)    RDW 16.2 (*)    All other components within normal limits  HEPATIC FUNCTION PANEL - Abnormal; Notable for the following components:   Total  Protein 9.7 (*)    Albumin 3.3 (*)    All other components within normal limits  LACTIC ACID, PLASMA - Abnormal; Notable for the following components:   Lactic Acid, Venous 2.5 (*)    All other components within normal limits  LACTIC ACID, PLASMA - Abnormal; Notable for the following components:   Lactic Acid, Venous 2.1 (*)    All other components within normal limits  URINALYSIS, ROUTINE W REFLEX MICROSCOPIC - Abnormal; Notable for the following components:   APPearance HAZY (*)    Nitrite POSITIVE (*)    Leukocytes,Ua TRACE (*)    Bacteria, UA MANY (*)    All other components within normal limits  D-DIMER, QUANTITATIVE (NOT AT [facility]) - Abnormal; Notable for the following components:   D-Dimer, Quant 2.71 (*)    All other components within normal limits  RESP PANEL BY RT-PCR (FLU A&B, COVID) ARPGX2  CULTURE, BLOOD (ROUTINE X 2)  CULTURE, BLOOD (ROUTINE X 2)  URINE CULTURE  PROTIME-INR  APTT  TROPONIN I (HIGH SENSITIVITY)  TROPONIN I (HIGH SENSITIVITY)    EKG EKG Interpretation  Date/Time:  Thursday March 10 2020 11:15:42 EST Ventricular Rate:  128 PR Interval:    QRS Duration: 74 QT Interval:  310 QTC Calculation: 453 R Axis:   87 Text Interpretation: Sinus or ectopic atrial tachycardia Ventricular premature complex Aberrant conduction of SV complex(es) Borderline right axis deviation Baseline wander in lead(s) V3 When compared toprior, similar apperance. NO sTEMI Confirmed by Antony Blackbird [id number]) on 03/10/2020 12:01:35 PM   Radiology DG Chest 2 View  Result Date: 03/10/2020 CLINICAL DATA:  History of lung carcinoma.  Shortness of breath EXAM: CHEST - 2 VIEW COMPARISON:  Chest CT November 02, 2019; chest radiograph August 20, 2019. PET-CT November 26, 2019 FINDINGS: There is opacity in the posterior left base at the site  of known mass. Mass is ill-defined by current radiographic examination with suspected surrounding ill-defined pneumonitis. Elsewhere no edema or airspace  opacity evident. Heart size and pulmonary vascular normal. No adenopathy. No bone lesions. IMPRESSION: Airspace opacity posterior left base, likely due to combination of mass and surrounding pneumonitis. Lungs elsewhere clear. Heart size normal. No adenopathy evident. Electronically Signed   By: Lowella Grip III M.D.   On: 03/10/2020 10:26    Procedures Procedures (including critical care time)  CRITICAL CARE Performed by: Gwenyth Allegra Kseniya Grunden Total critical care time: 35 minutes Critical care time was exclusive of separately billable procedures and treating other patients. Critical care was necessary to treat or prevent imminent or life-threatening deterioration. Critical care was time spent personally by me on the following activities: development of treatment plan with patient and/or surrogate as well as nursing, discussions with consultants, evaluation of patient's response to treatment, examination of patient, obtaining history from patient or surrogate, ordering and performing treatments and interventions, ordering and review of laboratory studies, ordering and review of radiographic studies, pulse oximetry and re-evaluation of patient's condition.   Medications Ordered in ED Medications  lactated ringers infusion ( Intravenous New Bag/Given 03/10/20 1230)  Ipratropium-Albuterol (COMBIVENT) respimat 1 puff (1 puff Inhalation Given 03/10/20 1401)  iohexol (OMNIPAQUE) 350 MG/ML injection 100 mL (has no administration in time range)  lactated ringers bolus 1,000 mL (0 mLs Intravenous Stopped 03/10/20 1402)    And  lactated ringers bolus 500 mL (500 mLs Intravenous New Bag/Given 03/10/20 1548)    And  lactated ringers bolus 250 mL (250 mLs Intravenous New Bag/Given 03/10/20 1548)  ceFEPIme (MAXIPIME) 2 g in sodium chloride 0.9 % 100 mL IVPB (0 g Intravenous Stopped 03/10/20 1247)  vancomycin (VANCOCIN) IVPB 1000 mg/200 mL premix (0 mg Intravenous Stopped 03/10/20 1331)  metroNIDAZOLE (FLAGYL)  IVPB 500 mg (500 mg Intravenous New Bag/Given 03/10/20 1356)    ED Course  I have reviewed the triage vital signs and the nursing notes.  Pertinent labs & imaging results that were available during my care of the patient were reviewed by me and considered in my medical decision making (see chart for details).    MDM Rules/Calculators/A&P                          Devon Peterson is a 65 y.o. 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 fib/flutter, and documentation last month of immunocompromise status who presents for hypoxia, tachycardia, fevers, chills, cough, shortness of breath.  Patient was brought in by EMS for hypoxia and he does not use oxygen normally.  Patient is now on 4 L to maintain oxygen saturations in the 90s.  Vital signs on initial evaluation showed a fever of 101.1, tachycardia in the 130s, and tachypnea.  He also was having hypoxia that is improved with the oxygen.  He is not hypotensive but based on his documentation supporting immunocompromise status and these vital signs, we will make him a code sepsis for further management.  Patient reports that he has had some fevers, chills, cough, shortness of breath.  He reportedly had a negative COVID swab yesterday however given his vitals and symptoms, we will recheck them today.  Patient is denying any abdominal pain or chest pain.  Denying any leg pain or leg swelling.  I do not see history of DVT or PE.  He  is denying any bowel changes, nausea, or vomiting.  He does have any shortness of breath and malaise and cough.  On exam, patient has coarse breath sounds bilaterally.  He is tachycardic and tachypneic.  Chest abdomen are nontender on my exam.  No murmur.  Legs are cachectic with no significant tenderness or swelling.  He is moving his extremities.  He is needing 4 L to maintain oxygen  saturations in the 90s.  Clinically I am concerned about either pneumonia versus COVID causing septic vital signs.  X-ray was obtained in triage showing consolidation left lower lung which could be either mass, pneumonitis however given the fever and other symptoms, and concern it could be a component of possible pneumonia.  Patient be given broad-spectrum antibiotics and will wait for his other work-up.  Given the tachycardia, tachypnea, hypoxia, he needs to have work-up to rule out pulmonary embolism.  Dimer was ordered.  The documentation in the chart show the patient has met criteria for immunosuppression however I do not see immunosuppressive medications ordered.  He did get the radiation yesterday as a last treatment.  He also received his COVID booster last week although his symptoms did not start until the last few days so I do not think this fever is likely related to his COVID shot.  Anticipate admission after work-up is completed.  3:30 PM COVID test returned and is negative.  Urinalysis does show nitrites, leukocytes, bacteria.  Suspect a component of urosepsis however with his hypoxia, I am still concerned about possible pneumonia versus PE.  His D-dimer was elevated so PE study was ordered.  Will call for admission for further management of sepsis either from urine versus lungs.  Still waiting on PE study results.   Final Clinical Impression(s) / ED Diagnoses Final diagnoses:  Hypoxia  Sepsis, due to unspecified organism, unspecified whether acute organ dysfunction present ([place])  Acute cystitis without hematuria  Cough     Clinical Impression: 1. Hypoxia   2. Sepsis, due to unspecified organism, unspecified whether acute organ dysfunction present ([place])   3. Acute cystitis without hematuria   4. Cough     Disposition: Admit  This note was prepared with assistance of Dragon voice recognition software. Occasional wrong-word or sound-a-like substitutions may have occurred  due to the inherent limitations of voice recognition software.     Orpah Hausner, Gwenyth Allegra, MD 03/10/20 [phone]

## 2020-03-11 ENCOUNTER — Ambulatory Visit: Payer: Medicaid Other | Admitting: Radiation Oncology

## 2020-03-11 DIAGNOSIS — C7802 Secondary malignant neoplasm of left lung: Secondary | ICD-10-CM | POA: Diagnosis not present

## 2020-03-11 DIAGNOSIS — Z931 Gastrostomy status: Secondary | ICD-10-CM

## 2020-03-11 DIAGNOSIS — J189 Pneumonia, unspecified organism: Secondary | ICD-10-CM

## 2020-03-11 DIAGNOSIS — C329 Malignant neoplasm of larynx, unspecified: Secondary | ICD-10-CM

## 2020-03-11 DIAGNOSIS — J9601 Acute respiratory failure with hypoxia: Secondary | ICD-10-CM | POA: Diagnosis not present

## 2020-03-11 DIAGNOSIS — J181 Lobar pneumonia, unspecified organism: Secondary | ICD-10-CM

## 2020-03-11 DIAGNOSIS — A419 Sepsis, unspecified organism: Secondary | ICD-10-CM | POA: Diagnosis not present

## 2020-03-11 DIAGNOSIS — E871 Hypo-osmolality and hyponatremia: Secondary | ICD-10-CM

## 2020-03-11 DIAGNOSIS — N390 Urinary tract infection, site not specified: Secondary | ICD-10-CM | POA: Diagnosis not present

## 2020-03-11 DIAGNOSIS — C3432 Malignant neoplasm of lower lobe, left bronchus or lung: Secondary | ICD-10-CM

## 2020-03-11 DIAGNOSIS — C3400 Malignant neoplasm of unspecified main bronchus: Secondary | ICD-10-CM

## 2020-03-11 LAB — PROTIME-INR
INR: 1.1 (ref 0.8–1.2)
Prothrombin Time: 13.3 seconds (ref 11.4–15.2)

## 2020-03-11 LAB — BASIC METABOLIC PANEL
Anion gap: 10 (ref 5–15)
BUN: 24 mg/dL — ABNORMAL HIGH (ref 8–23)
CO2: 30 mmol/L (ref 22–32)
Calcium: 9.3 mg/dL (ref 8.9–10.3)
Chloride: 116 mmol/L — ABNORMAL HIGH (ref 98–111)
Creatinine, Ser: 0.78 mg/dL (ref 0.61–1.24)
GFR, Estimated: >60 mL/min (ref >60)
Glucose, Bld: 96 mg/dL (ref 70–99)
Potassium: 4 mmol/L (ref 3.5–5.1)
Sodium: 156 mmol/L — ABNORMAL HIGH (ref 135–145)

## 2020-03-11 LAB — PROCALCITONIN: Procalcitonin: 0.57 ng/mL

## 2020-03-11 LAB — CORTISOL-AM, BLOOD: Cortisol - AM: 22.9 ug/dL — ABNORMAL HIGH (ref 6.7–22.6)

## 2020-03-11 MED ORDER — PANTOPRAZOLE SODIUM 40 MG PO TBEC: 40.0000 mg | DELAYED_RELEASE_TABLET | Freq: Every day | ORAL | Status: DC

## 2020-03-11 MED ORDER — PROSOURCE TF PO LIQD
45.0000 mL | Freq: Every day | ORAL | Status: DC
  Administered 2020-03-11 – 2020-03-18 (×8): 45 mL
  Filled 2020-03-11 (×9): qty 45

## 2020-03-11 MED ORDER — METRONIDAZOLE IN NACL 5-0.79 MG/ML-% IV SOLN
500.0000 mg | Freq: Three times a day (TID) | INTRAVENOUS | Status: DC
  Administered 2020-03-11 – 2020-03-16 (×16): 500 mg via INTRAVENOUS
  Filled 2020-03-11 (×19): qty 100

## 2020-03-11 MED ORDER — OSMOLITE 1.5 CAL PO LIQD
1000.0000 mL | ORAL | Status: DC
  Filled 2020-03-11: qty 1000

## 2020-03-11 MED ORDER — SODIUM CHLORIDE 0.9 % IV SOLN
2.0000 g | Freq: Three times a day (TID) | INTRAVENOUS | Status: DC
  Administered 2020-03-11 – 2020-03-13 (×8): 2 g via INTRAVENOUS
  Filled 2020-03-11 (×8): qty 2

## 2020-03-11 MED ORDER — ACETAMINOPHEN 650 MG RE SUPP
650.0000 mg | Freq: Four times a day (QID) | RECTAL | Status: DC | PRN
  Filled 2020-03-11: qty 1

## 2020-03-11 MED ORDER — OXYCODONE HCL 5 MG PO TABS
5.0000 mg | ORAL_TABLET | Freq: Three times a day (TID) | ORAL | Status: DC | PRN
  Administered 2020-03-12 – 2020-03-16 (×6): 5 mg via ORAL
  Filled 2020-03-11 (×8): qty 1

## 2020-03-11 MED ORDER — NYSTATIN 100000 UNIT/ML MT SUSP
5.0000 mL | Freq: Four times a day (QID) | OROMUCOSAL | Status: DC | PRN
  Filled 2020-03-11: qty 5

## 2020-03-11 MED ORDER — ACETAMINOPHEN 325 MG PO TABS: 650.0000 mg | ORAL_TABLET | Freq: Four times a day (QID) | ORAL | Status: DC | PRN

## 2020-03-11 MED ORDER — ISOSOURCE 1.5 CAL PO LIQD: 60.0000 mL | ORAL | Status: DC

## 2020-03-11 MED ORDER — PROSOURCE PLUS PO LIQD: 30.0000 mL | Freq: Every day | ORAL | Status: DC

## 2020-03-11 MED ORDER — ENOXAPARIN SODIUM 40 MG/0.4ML ~~LOC~~ SOLN
40.0000 mg | SUBCUTANEOUS | Status: DC
  Administered 2020-03-11 – 2020-03-18 (×8): 40 mg via SUBCUTANEOUS
  Filled 2020-03-11 (×8): qty 0.4

## 2020-03-11 MED ORDER — SENNA 8.6 MG PO TABS
2.0000 | ORAL_TABLET | Freq: Every day | ORAL | Status: DC
  Administered 2020-03-11 – 2020-03-16 (×3): 17.2 mg via ORAL
  Filled 2020-03-11 (×8): qty 2

## 2020-03-11 MED ORDER — PRO-STAT SUGAR FREE PO LIQD
30.0000 mL | Freq: Two times a day (BID) | ORAL | Status: DC
  Administered 2020-03-11: 30 mL
  Filled 2020-03-11 (×2): qty 30

## 2020-03-11 MED ORDER — OSMOLITE 1.5 CAL PO LIQD
1000.0000 mL | ORAL | Status: AC
Start: 2020-03-11 — End: 2020-03-18
  Administered 2020-03-11 – 2020-03-17 (×7): 1000 mL
  Filled 2020-03-11 (×7): qty 1000

## 2020-03-11 MED ORDER — FREE WATER
200.0000 mL | Status: DC
  Administered 2020-03-11 – 2020-03-12 (×7): 200 mL

## 2020-03-11 MED ORDER — THIAMINE HCL 100 MG PO TABS
100.0000 mg | ORAL_TABLET | Freq: Every day | ORAL | Status: DC
  Administered 2020-03-11 – 2020-03-18 (×8): 100 mg via ORAL
  Filled 2020-03-11 (×10): qty 1

## 2020-03-11 NOTE — Progress Notes (Signed)
PHARMACY NOTE:  ANTIMICROBIAL RENAL DOSAGE ADJUSTMENT  Current antimicrobial regimen includes a mismatch between antimicrobial dosage and estimated renal function.  As per policy approved by the Pharmacy & Therapeutics and Medical Executive Committees, the antimicrobial dosage will be adjusted accordingly.  Current antimicrobial dosage:  Cefepime 2 g iv q 12 hours  Indication: PNA  Renal Function:  Estimated Creatinine Clearance: 66 mL/min (by C-G formula based on SCr of 0.78 mg/dL). []      On intermittent HD, scheduled: []      On CRRT    Antimicrobial dosage has been changed to:  Cefepime 2 g iv q 8 hours  Additional comments:   Thank you for allowing pharmacy to be a part of this patient's care.  Napoleon Form, [facility] 03/11/2020 7:36 AM

## 2020-03-11 NOTE — Progress Notes (Addendum)
HEMATOLOGY-ONCOLOGY PROGRESS NOTE  SUBJECTIVE: [name] is well-known to our practice.  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  He completed radiation on 08/21/2019 and had a complete response.  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  He is receiving radiation to this lung mass under the care of Dr. Isidore Moos.  The patient came to the emergency room secondary to dyspnea.  He was found to be septic.  He has evidence of a UTI and pneumonia and has been started on broad-spectrum antibiotics.  CTA chest showed at least 3 enlarging pulmonary nodules in the right upper and lower lobes which have enlarged over the past 3 months since the last PET scan and highly concerning for metastatic disease.  The patient was seen in the emergency room.  He reports ongoing shortness of breath.  Denies chest pain.  Reports some difficulty swallowing still.  PHYSICAL EXAMINATION:  Vitals:   03/11/20 1200 03/11/20 1230  BP: 103/80 109/78  Pulse: 76 81  Resp: (!) 21 (!) 24  Temp:    SpO2: 92% 93%   Filed Weights   03/10/20 1042  Weight: 50 kg    Intake/Output from previous day: 01/06 0701 - 01/07 0700 In: 2761.7 [I.V.:212.1; IV Piggyback:2549.6] Out: -   GENERAL: Awake and alert, cachectic, no distress SKIN: skin color, texture, turgor are normal, no rashes or significant lesions EYES: normal, Conjunctiva are pink and non-injected, sclera clear OROPHARYNX: Dry oral mucosa LUNGS: Rhonchi present, O2 at 2 L HEART: regular rate & rhythm and no murmurs and no lower extremity edema ABDOMEN:abdomen soft, non-tender and normal bowel sounds NEURO: alert & oriented x 3 with fluent speech, no focal motor/sensory deficits  LABORATORY DATA:  I have reviewed the data as listed CMP Latest Ref Rng & Units 03/11/2020 03/10/2020 03/10/2020  Glucose 70 - 99 mg/dL 96 124(H) -  BUN 8 - 23 mg/dL 24(H) 38(H) -  Creatinine 0.61 - 1.24  mg/dL 0.78 0.93 -  Sodium 135 - 145 mmol/L 156(H) 155(H) -  Potassium 3.5 - 5.1 mmol/L 4.0 4.8 -  Chloride 98 - 111 mmol/L 116(H) 112(H) -  CO2 22 - 32 mmol/L 30 32 -  Calcium 8.9 - 10.3 mg/dL 9.3 8.9 -  Total Protein 6.5 - 8.1 g/dL - - 9.7(H)  Total Bilirubin 0.3 - 1.2 mg/dL - - 0.4  Alkaline Phos 38 - 126 U/L - - 108  AST 15 - 41 U/L - - 28  ALT 0 - 44 U/L - - 18    Lab Results  Component Value Date   WBC 12.5 (H) 03/10/2020   HGB 11.9 (L) 03/10/2020   HCT 39.2 03/10/2020   MCV 77.6 (L) 03/10/2020   PLT 350 03/10/2020   NEUTROABS 4.9 02/23/2020    DG Chest 2 View  Result Date: 03/10/2020 CLINICAL DATA:  History of lung carcinoma.  Shortness of breath EXAM: CHEST - 2 VIEW COMPARISON:  Chest CT November 02, 2019; chest radiograph August 20, 2019. PET-CT November 26, 2019 FINDINGS: There is opacity in the posterior left base at the site of known mass. Mass is ill-defined by current radiographic examination with suspected surrounding ill-defined pneumonitis. Elsewhere no edema or airspace opacity evident. Heart size and pulmonary vascular normal. No adenopathy. No bone lesions. IMPRESSION: Airspace opacity posterior left base, likely due to combination of mass and surrounding pneumonitis. Lungs elsewhere clear. Heart size normal. No adenopathy evident. Electronically  Signed   By: Lowella Grip III M.D.   On: 03/10/2020 10:26   CT Angio Chest PE W and/or Wo Contrast  Result Date: 03/10/2020 CLINICAL DATA:  Known lung cancer currently undergoing radiation therapy (last yesterday). Sepsis, tachycardia and hypoxia. COVID test was negative. Evaluate for pneumonia versus pulmonary embolus. EXAM: CT ANGIOGRAPHY CHEST WITH CONTRAST TECHNIQUE: Multidetector CT imaging of the chest was performed using the standard protocol during bolus administration of intravenous contrast. Multiplanar CT image reconstructions and MIPs were obtained to evaluate the vascular anatomy. CONTRAST:  186mL OMNIPAQUE  IOHEXOL 350 MG/ML SOLN COMPARISON:  Recent PET-CT 11/26/2019; CT biopsy 02/11/2020 FINDINGS: Cardiovascular: Satisfactory opacification of the pulmonary arteries to the segmental level. No evidence of pulmonary embolism. Normal heart size. No pericardial effusion. Coronary artery calcifications. Mediastinum/Nodes: No enlarged mediastinal, hilar, or axillary lymph nodes. Thyroid gland, trachea, and esophagus demonstrate no significant findings. Lungs/Pleura: Enlarging right upper lobe pulmonary nodule measures up to 0.9 cm and is highly concerning for metastatic disease. Enlarging nodule within the superior segment of the right lower lobe now measures 0.5 cm. Enlarging right lower lobe pulmonary nodule now measuring up to 0.7 cm. Interval development of extensive consolidative airspace opacities throughout the left lower lobe with associated volume loss. The left mainstem bronchus is completely obscured and may be stenosed or impacted with debris. The previously identified mass can be vaguely identified as low attenuation in the center of the consolidated left lower lobe. Background of centrilobular pulmonary emphysema. No pleural effusion. Upper Abdomen: No acute abnormality. Percutaneous gastrostomy tube in place. Musculoskeletal: No chest wall abnormality. No acute or significant osseous findings. Review of the MIP images confirms the above findings. IMPRESSION: 1. Negative for acute pulmonary embolus. 2. Interval development of dense consolidation and associated volume loss within the left lower lobe. The left lower lobe bronchus is also completely obscured and is likely impacted with debris. Findings are most concerning for lobar pneumonia. Acute radiation pneumonitis is possible but considered less likely given the timing (radiation therapy is still on going). 3. At least 3 enlarging pulmonary nodules are identified in the right upper and lower lobes. These were barely identifiable as punctate foci on the prior  PET-CT. Enlargement over the past 3 months is highly concerning for metastatic disease. 4. Coronary artery calcifications. 5. Percutaneous gastrostomy tube in place. 6. Centrilobular pulmonary emphysema. Emphysema (ICD10-J43.9). Electronically Signed   By: Jacqulynn Cadet M.D.   On: 03/10/2020 17:23   CT Biopsy  Result Date: 02/11/2020 CLINICAL DATA:  Head and neck carcinoma. Hypermetabolic left lower lobe lung mass. EXAM: CT GUIDED CORE BIOPSY OF LUNG MASS ANESTHESIA/SEDATION: Intravenous Fentanyl 73mcg and Versed 1.5mg  were administered as conscious sedation during continuous monitoring of the patient's level of consciousness and physiological / cardiorespiratory status by the radiology RN, with a total moderate sedation time of 20 minutes. PROCEDURE: The procedure risks, benefits, and alternatives were explained to the patient. Questions regarding the procedure were encouraged and answered. The patient understands and consents to the procedure. Patient placed prone. Select axial scans through the thorax obtained. The posterior left lower lobe mass was localized and an appropriate skin entry site was determined and marked. The operative field was prepped with chlorhexidinein a sterile fashion, and a sterile drape was applied covering the operative field. A sterile gown and sterile gloves were used for the procedure. Local anesthesia was provided with 1% Lidocaine. Under CT fluoroscopic guidance, a 17 gauge trocar needle was advanced to the margin of the lesion. Once  needle tip position was confirmed, coaxial 18-gauge core biopsy samples were obtained, submitted in formalin to surgical pathology. The guide needle was removed. Small pneumothorax was noted on the immediate post procedure images. Delayed imaging shows no progression of collection consistent with some air having entered through the guide needle, as no aerated lung was traversed to be source of pneumothorax. COMPLICATIONS: None immediate FINDINGS:  Posterior left lower lobe lung mass localized. Representative core biopsy samples obtained as above. IMPRESSION: Technically successful CT-guided core biopsy, left lower lobe lung mass. Electronically Signed   By: Lucrezia Europe M.D.   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 Left lower lobe of the lung mass concerning for primary neoplasm versus metastatic disease -07/07/2019 CT angiogram of the chest 2.7 cm mass in the left lower lobe of the lung -CT 08/17/2019-persistent low-attenuation lesion in the superior segment of left lower lobe with surrounding consolidation/collapse, stable from 07/07/2019, consolidation and groundglass opacity in the left lower lobe have largely resolved -08/20/2019 bronchoscopy-biopsy nondiagnostic -CT chest 11/02/2019-, increased in size concerning for a primary lung malignancy versus a metastasis versus pulmonary abscess, improved left lower lobe aeration, COPD             -PET scan 11/26/2019-3.7 cm left lower lobe pulmonary mass, centrally necrotic and markedly hypermetabolic.  Second tiny 7 mm irregular nodular opacity in the superior segment left lower lobe shows low-level FDG accumulation.  Low-level uptake in an enlarged AP window lymph node.  Mild hypermetabolism in a normal-appearing left axillary lymph node, felt to be reactive.  Bony uptake identified in the pelvis without underlying lesions evident by CT.           -CT  biopsy of left lung mass 02/11/2020-squamous cell carcinoma 3. Cachexia 4. History of aspiration pneumonia 5. Mild anemia 6. Atrial fibrillation with RVR 6. COPD 8. Alcohol abuse 9. Tobacco dependence 10. Cocaine abuse 11. Odynophagia secondary to radiation-improved 12.  Hospital admission 03/10/2020-sepsis secondary to UTI pneumonia  [name] is now admitted for treatment of UTI and pneumonia.  He has been placed on broad-spectrum antibiotics.  CTA chest not show any evidence of PE.  He was also noted to have consolidation in the left lower lobe concerning for lobar pneumonia.  CT also question possible radiation pneumonitis but less likely as it does not fit the typical timeframe.  He was also found to have at least 3 enlarging pulmonary nodules in the right upper and lower lobes which have enlarged over the past 3 months highly concerning for metastatic disease.  CT scan results of been discussed with the patient.  He is aware that there are findings concerning for disease progression.  Due to poor nutritional status and performance status, he is not a great candidate for chemotherapy.    Recommendations: 1.  Antibiotics per hospitalist for treatment of UTI and pneumonia. 2.  The patient likely does not have radiation pneumonitis because it does not fit the typical timeframe.  Would recommend reaching out to radiation oncology (Dr. Isidore Moos) for further recommendations. 3.  Continue radiation under the care of Dr. Isidore Moos. 4.  Will arrange for outpatient follow-up at the cancer center to make further recommendations  regarding management of his new pulmonary nodules. 5.  Please call oncology as needed    LOS: 1 day   Mikey Bussing, DNP, AGPCNP-BC, AOCNP 03/11/20 [name] was interviewed and examined.  He is currently completing a course of radiation to the left lower lung mass which likely represents a primary non-small cell lung cancer. The admission chest CT reveals evidence of  left lower lobe consolidation in addition to the known left lower lobe mass.  He most likely has pneumonia.  There are several new/enlarging right-sided lung nodules consistent with metastatic disease.  I recommend hospital admission for antibiotics and oxygen support.  I will check on [name] 03/14/2020.  Outpatient follow-up will be scheduled at the Cancer center.

## 2020-03-11 NOTE — ED Notes (Signed)
Son, Devon Peterson wants update.

## 2020-03-11 NOTE — Progress Notes (Signed)
Initial Nutrition Assessment  DOCUMENTATION CODES:   Underweight (Suspect severe PCM continues)  INTERVENTION:   Transition to home bolus regimen once tolerance is established  Tube feeding:  -Osmolite 1.5 @ 20 ml/hr via G-tube -Increase by 10 ml Q8 hours to goal rate of 50 ml/hr (1200 ml) -ProSource TF 45 ml daily -Free water 200 ml Q4 hours   Provides: 1840 kcals, 86 grams protein, 914 ml free water (2114 ml with flushes)   NUTRITION DIAGNOSIS:   Inadequate oral intake related to inability to eat as evidenced by NPO status.   GOAL:   Patient will meet greater than or equal to 90% of their needs  MONITOR:   Skin,TF tolerance,Weight trends,Labs,I & O's  REASON FOR ASSESSMENT:   Consult Enteral/tube feeding initiation and management  ASSESSMENT:   Patient with PMH significant for metastatic lung cancer s/p G-tube and COPD. Presents this admission with sepsis 2/2 to UTI/PNA.   Unable to obtain history from patient. Home medications show patient receives bolus feedings of IsoSource 1.5 five cans/ARC daily with Prostat BID. Unsure if patient has received this consistently so will be conservative and start continuous feedings of comparable tube feeding of Osmolite 1.5. Monitor for signs of refeeding. May transition back to bolus feedings once tolerance is established.    Records indicate patient weighed 53/1 kg on 02/17/20 and 50 kg this admission. Patient has long standing history of severe malnutrition, suspect this continues but unable to diagnose without NFPE.   Medications: senokot, thiamine  Labs: Na 156 (H) CBG 96-134  Diet Order:   Diet Order            Diet NPO time specified Except for: Sips with Meds  Diet effective now                 EDUCATION NEEDS:   Not appropriate for education at this time  Skin:  Skin Assessment: Reviewed RN Assessment  Last BM:  PTA  Height:   Ht Readings from Last 1 Encounters:  02/23/20 5\' 11"  (1.803 m)     Weight:   Wt Readings from Last 1 Encounters:  03/10/20 50 kg    BMI:  Body mass index is 15.37 kg/m.  Estimated Nutritional Needs:   Kcal:  1650-1850 kcal  Protein:  80-100 grams  Fluid:  >/= 1.6 L/day   Mariana Single RD, LDN Clinical Nutrition Pager listed in [place]

## 2020-03-11 NOTE — Progress Notes (Signed)
PROGRESS NOTE    Devon Peterson  [id number] DOB: 1955/05/28 DOA: 03/10/2020 PCP: Patient, No Pcp Per    Chief Complaint  Patient presents with  . 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 fib and aspiration pneumonia, he is sent from nursing home to [facility] due to short of breath, cough, increased sputum production, fever and hypoxia  CT A no PE, + left lower lobe lobar pneumonia, left lower lobe bronchus completely obscured and is likely impacted with debris , CTA also concern new metastatic lung lesions right upper and lower lobes  UA concerning for UTI  Subjective:  Fever subsided, less tachypnea, currently denies pain  Assessment & Plan:   Active Problems:   Sepsis (Echo)   Sepsis present on admission with UTI and lobar pneumonia  (aspiration pneumonia due to debris seen in left lower lobe bronchus) -COVID-19 screen negative -MRSA screen negative -Blood culture in process -We will attempt to obtain sputum culture -DC vancomycin, continue cefepime and Flagyl for now  Acute hypoxic respiratory failure likely due to lobar pneumonia and sepsis Treat underlying etiology, oxygen supplement  History of COPD Currently no wheezing  UTI Urine culture in process, already on antibiotics  Hypernatremia Likely due to dehydration, continue normal saline, start free water flushes per tube  H/o Afib/aflutter Metoprolol held due to sepsis Resume metoprolol with holding parameter CHADsvacs score 0.  Severe malnutrition, underweight Body mass index is 15.37 kg/m.  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 Benay Spice  Procedures:   None  Antimicrobials:   Vanco x1 on admission Cefepime and Flagyl since admission     Objective: Vitals:   03/11/20 0530 03/11/20 0600 03/11/20 0630 03/11/20 0700  BP: 119/77 94/74 112/71 114/63  Pulse: 100 (!) 105 97 99  Resp: 18 19 20  (!) 21  Temp:      TempSrc:      SpO2: 95% 99% 98% 100%  Weight:        Intake/Output Summary (Last 24 hours) at 03/11/2020 0809 Last data filed at 03/11/2020 0708 Gross per 24 hour  Intake 2761.65 ml  Output 700 ml  Net 2061.65 ml   Filed Weights   03/10/20 1042  Weight: 50 kg    Examination:  General exam: Malnourished, positive PEG Respiratory system: Positive rhonchi, less tachypneic, Cardiovascular system: S1 & S2 heard, RRR. No  pedal edema. Gastrointestinal system: Abdomen is nondistended, soft and nontender.. Normal bowel sounds heard, PEG tube in place Central nervous system: Alert and oriented. No focal neurological deficits. Extremities: Symmetric, generalized weakness Skin: No rashes, lesions or ulcers Psychiatry: Judgement and insight appear normal. Mood & affect appropriate.     Data Reviewed: I have personally reviewed following labs and imaging studies  CBC: Recent Labs  Lab 03/10/20 1007  WBC 12.5*  HGB 11.9*  HCT 39.2  MCV 77.6*  PLT 321    Basic Metabolic Panel: Recent  Labs  Lab 03/10/20 1007 03/10/20 1753 03/11/20 0508  NA 159* 155* 156*  K 4.3 4.8 4.0  CL 110 112* 116*  CO2 31 32 30  GLUCOSE 134* 124* 96  BUN 49* 38* 24*  CREATININE 1.08 0.93 0.78  CALCIUM 9.9 8.9 9.3    GFR: Estimated Creatinine Clearance: 66 mL/min (by C-G formula based on SCr of 0.78 mg/dL).  Liver Function Tests: Recent Labs  Lab 03/10/20 1055  AST 28  ALT 18  ALKPHOS 108  BILITOT 0.4  PROT 9.7*  ALBUMIN 3.3*    CBG: No results for input(s): GLUCAP in the last 168 hours.   Recent Results (from the past 240 hour(s))  Blood Culture (routine x 2)     Status: None (Preliminary result)   Collection Time: 03/10/20 12:15 PM   Specimen: BLOOD LEFT FOREARM  Result Value Ref Range Status   Specimen Description BLOOD LEFT FOREARM  Final   Special Requests   Final    BOTTLES DRAWN AEROBIC AND ANAEROBIC Blood Culture adequate volume Performed at [facility], [place] [address] [place], [place] [address]    Culture PENDING  Incomplete   Report Status PENDING  Incomplete  Blood Culture (routine x 2)     Status: None (Preliminary result)   Collection Time: 03/10/20 12:15 PM   Specimen: BLOOD RIGHT FOREARM  Result Value Ref Range Status   Specimen Description   Final    BLOOD RIGHT FOREARM Performed at [facility], [place] [address], [place], [place] [address]    Special Requests   Final    BOTTLES DRAWN AEROBIC AND ANAEROBIC Blood Culture results may not be optimal due to an inadequate volume of blood received in culture bottles Performed at [facility] [address], [place], [place] [address]    Culture PENDING  Incomplete   Report Status PENDING  Incomplete  Resp Panel by RT-PCR (Flu A&B, Covid) Nasopharyngeal Swab     Status: None   Collection Time: 03/10/20  1:00 PM   Specimen: Nasopharyngeal Swab; Nasopharyngeal(NP) swabs in vial transport medium  Result Value Ref Range Status   SARS Coronavirus 2 by RT PCR NEGATIVE NEGATIVE  Final    Comment: (NOTE) SARS-CoV-2 target nucleic acids are NOT DETECTED.  The SARS-CoV-2 RNA is generally detectable in upper respiratory specimens during the acute phase of infection. The lowest concentration of SARS-CoV-2 viral copies this assay can detect is 138 copies/mL. A negative result does not preclude SARS-Cov-2 infection and should not be used as the sole basis for treatment or other patient management decisions. A negative result may occur with  improper specimen collection/handling, submission of specimen other than nasopharyngeal swab, presence of viral mutation(s) within the areas targeted by this assay, and inadequate number of viral copies(<138 copies/mL). A negative result must be combined with clinical observations, patient history, and epidemiological information. The expected result is Negative.  Fact Sheet for Patients:  EntrepreneurPulse.com.au  Fact Sheet for Healthcare Providers:  IncredibleEmployment.be  This test is no t yet approved or cleared by the [place] FDA and  has been authorized for detection and/or diagnosis of SARS-CoV-2 by FDA under an Emergency Use Authorization (EUA). This EUA will remain  in effect (meaning this test can be used) for the duration of the COVID-19 declaration under Section 564(b)(1) of the Act, 21 U.S.C.section 360bbb-3(b)(1), unless the authorization is terminated  or revoked sooner.       Influenza A by PCR NEGATIVE NEGATIVE Final   Influenza B by PCR NEGATIVE NEGATIVE Final    Comment: (NOTE) The Xpert Xpress SARS-CoV-2/FLU/RSV plus assay is intended as an aid in the diagnosis of influenza from Nasopharyngeal swab specimens and should not be used as a sole basis for treatment. Nasal washings and aspirates are unacceptable for Xpert Xpress SARS-CoV-2/FLU/RSV testing.  Fact Sheet for Patients: EntrepreneurPulse.com.au  Fact Sheet for Healthcare  Providers: IncredibleEmployment.be  This test is not yet approved or cleared by the [place] FDA and has been authorized for detection and/or diagnosis of SARS-CoV-2 by FDA under an Emergency Use Authorization (EUA). This EUA will remain in effect (meaning this test can be used) for the duration of the COVID-19 declaration under Section 564(b)(1) of the Act, 21 U.S.C. section 360bbb-3(b)(1), unless the authorization is terminated or revoked.  Performed at [facility], [place] [address] [place], [place] [address]   MRSA PCR Screening     Status: None   Collection Time: 03/10/20  8:38 PM   Specimen: Nasopharyngeal  Result Value Ref Range Status   MRSA by PCR NEGATIVE NEGATIVE Final    Comment:        The GeneXpert MRSA Assay (FDA approved for NASAL specimens only), is one component of a comprehensive MRSA colonization surveillance program. It is not intended to diagnose MRSA infection nor to guide or monitor treatment for MRSA infections. Performed at [facility], [place] [address], [place], [place] [address]          Radiology Studies: DG Chest 2 View  Result Date: 03/10/2020 CLINICAL DATA:  History of lung carcinoma.  Shortness of breath EXAM: CHEST - 2 VIEW COMPARISON:  Chest CT November 02, 2019; chest radiograph August 20, 2019. PET-CT November 26, 2019 FINDINGS: There is opacity in the posterior left base at the site of known mass. Mass is ill-defined by current radiographic examination with suspected surrounding ill-defined pneumonitis. Elsewhere no edema or airspace opacity evident. Heart size and pulmonary vascular normal. No adenopathy. No bone lesions. IMPRESSION: Airspace opacity posterior left base, likely due to combination of mass and surrounding pneumonitis. Lungs elsewhere clear. Heart size normal. No adenopathy evident. Electronically Signed   By: Lowella Grip III M.D.   On: 03/10/2020 10:26   CT  Angio Chest PE W and/or Wo Contrast  Result Date: 03/10/2020 CLINICAL DATA:  Known lung cancer currently undergoing radiation therapy (last yesterday). Sepsis, tachycardia and hypoxia. COVID test was negative. Evaluate for pneumonia versus pulmonary embolus. EXAM: CT ANGIOGRAPHY CHEST WITH CONTRAST TECHNIQUE: Multidetector CT imaging of the chest was performed using the standard protocol during bolus administration of intravenous contrast. Multiplanar CT image reconstructions and MIPs were obtained to evaluate the vascular anatomy. CONTRAST:  162mL OMNIPAQUE IOHEXOL 350 MG/ML SOLN COMPARISON:  Recent PET-CT 11/26/2019; CT biopsy 02/11/2020 FINDINGS: Cardiovascular: Satisfactory opacification of the pulmonary arteries to the segmental level. No evidence of pulmonary embolism. Normal heart size. No pericardial effusion. Coronary artery calcifications.  Mediastinum/Nodes: No enlarged mediastinal, hilar, or axillary lymph nodes. Thyroid gland, trachea, and esophagus demonstrate no significant findings. Lungs/Pleura: Enlarging right upper lobe pulmonary nodule measures up to 0.9 cm and is highly concerning for metastatic disease. Enlarging nodule within the superior segment of the right lower lobe now measures 0.5 cm. Enlarging right lower lobe pulmonary nodule now measuring up to 0.7 cm. Interval development of extensive consolidative airspace opacities throughout the left lower lobe with associated volume loss. The left mainstem bronchus is completely obscured and may be stenosed or impacted with debris. The previously identified mass can be vaguely identified as low attenuation in the center of the consolidated left lower lobe. Background of centrilobular pulmonary emphysema. No pleural effusion. Upper Abdomen: No acute abnormality. Percutaneous gastrostomy tube in place. Musculoskeletal: No chest wall abnormality. No acute or significant osseous findings. Review of the MIP images confirms the above findings.  IMPRESSION: 1. Negative for acute pulmonary embolus. 2. Interval development of dense consolidation and associated volume loss within the left lower lobe. The left lower lobe bronchus is also completely obscured and is likely impacted with debris. Findings are most concerning for lobar pneumonia. Acute radiation pneumonitis is possible but considered less likely given the timing (radiation therapy is still on going). 3. At least 3 enlarging pulmonary nodules are identified in the right upper and lower lobes. These were barely identifiable as punctate foci on the prior PET-CT. Enlargement over the past 3 months is highly concerning for metastatic disease. 4. Coronary artery calcifications. 5. Percutaneous gastrostomy tube in place. 6. Centrilobular pulmonary emphysema. Emphysema (ICD10-J43.9). Electronically Signed   By: Jacqulynn Cadet M.D.   On: 03/10/2020 17:23        Scheduled Meds: . enoxaparin (LOVENOX) injection  40 mg Subcutaneous Q24H  . feeding supplement (PRO-STAT SUGAR FREE 64)  30 mL Per Tube BID  . free water  200 mL Per Tube Q4H  . Ipratropium-Albuterol  1 puff Inhalation TID  . Isosource 1.5 Cal  60 mL Per Tube See admin instructions  . pantoprazole  40 mg Oral Daily  . senna  2 tablet Oral Daily  . thiamine  100 mg Oral Daily   Continuous Infusions: . sodium chloride 125 mL/hr at 03/11/20 0708  . ceFEPime (MAXIPIME) IV    . metronidazole Stopped (03/11/20 0319)     LOS: 1 day   Time spent: 73mins, case discussed with oncology Dr. Gearldine Shown through secure chat Greater than 50% of this time was spent in counseling, explanation of diagnosis, planning of further management, and coordination of care.  I have personally reviewed and interpreted on  03/11/2020 daily labs, tele strips, imagings as discussed above under date review session and assessment and plans.  I reviewed all nursing notes, pharmacy notes, consultant notes,  vitals, pertinent old records  I have  discussed plan of care as described above with RN , patient on 03/11/2020  Voice Recognition /Dragon dictation system was used to create this note, attempts have been made to correct errors. Please contact the author with questions and/or clarifications.   Florencia Reasons, MD PhD FACP [facility]  Available via Epic secure chat 7am-7pm for nonurgent issues Please page for urgent issues To page the attending provider between 7A-7P or the covering provider during after hours 7P-7A, please log into the web site [email or web address] and access using universal [place] password for that web site. If you do not have the password, please call the hospital operator.    03/11/2020, 8:09 AM

## 2020-03-11 NOTE — Progress Notes (Signed)
SLP Cancellation Note  Patient Details Name: [name] MRN: [id number] DOB: 01/24/1956   Cancelled treatment:       Reason Eval/Treat Not Completed:  (SLP messaged Dr Erlinda Hong re: order. In lieu of BSE - advised MBS due to pt's prior dysphagia and sensorimotor deficits.  Per MD plan, will conduct MBS later in hospital coarse when pt's oral candidiasis and respiratory status has improved. Ice chips after oral care advised in the interim. Thanks.   Kathleen Lime, MS [facility] SLP Acute Rehab Services Office [phone] Pager [phone]     [name] 03/11/2020, 9:20 PM

## 2020-03-12 ENCOUNTER — Other Ambulatory Visit: Payer: Self-pay

## 2020-03-12 DIAGNOSIS — C3490 Malignant neoplasm of unspecified part of unspecified bronchus or lung: Secondary | ICD-10-CM

## 2020-03-12 DIAGNOSIS — E870 Hyperosmolality and hypernatremia: Secondary | ICD-10-CM | POA: Diagnosis not present

## 2020-03-12 DIAGNOSIS — R0902 Hypoxemia: Secondary | ICD-10-CM | POA: Diagnosis not present

## 2020-03-12 DIAGNOSIS — R531 Weakness: Secondary | ICD-10-CM

## 2020-03-12 DIAGNOSIS — Z7189 Other specified counseling: Secondary | ICD-10-CM

## 2020-03-12 DIAGNOSIS — Z931 Gastrostomy status: Secondary | ICD-10-CM | POA: Diagnosis not present

## 2020-03-12 DIAGNOSIS — A419 Sepsis, unspecified organism: Secondary | ICD-10-CM | POA: Diagnosis not present

## 2020-03-12 DIAGNOSIS — N3000 Acute cystitis without hematuria: Secondary | ICD-10-CM | POA: Diagnosis not present

## 2020-03-12 DIAGNOSIS — Z515 Encounter for palliative care: Secondary | ICD-10-CM

## 2020-03-12 LAB — CBC WITH DIFFERENTIAL/PLATELET
Abs Immature Granulocytes: 0.08 10*3/uL — ABNORMAL HIGH (ref 0.00–0.07)
Basophils Absolute: 0 10*3/uL (ref 0.0–0.1)
Basophils Relative: 0 %
Eosinophils Absolute: 0.1 10*3/uL (ref 0.0–0.5)
Eosinophils Relative: 1 %
HCT: 36 % — ABNORMAL LOW (ref 39.0–52.0)
Hemoglobin: 10.8 g/dL — ABNORMAL LOW (ref 13.0–17.0)
Immature Granulocytes: 1 %
Lymphocytes Relative: 10 %
Lymphs Abs: 0.7 10*3/uL (ref 0.7–4.0)
MCH: 23.8 pg — ABNORMAL LOW (ref 26.0–34.0)
MCHC: 30 g/dL (ref 30.0–36.0)
MCV: 79.3 fL — ABNORMAL LOW (ref 80.0–100.0)
Monocytes Absolute: 0.3 10*3/uL (ref 0.1–1.0)
Monocytes Relative: 4 %
Neutro Abs: 6 10*3/uL (ref 1.7–7.7)
Neutrophils Relative %: 84 %
Platelets: 270 10*3/uL (ref 150–400)
RBC: 4.54 MIL/uL (ref 4.22–5.81)
RDW: 16.1 % — ABNORMAL HIGH (ref 11.5–15.5)
WBC: 7.2 10*3/uL (ref 4.0–10.5)
nRBC: 0.4 % — ABNORMAL HIGH (ref 0.0–0.2)

## 2020-03-12 LAB — BASIC METABOLIC PANEL
Anion gap: 12 (ref 5–15)
BUN: 21 mg/dL (ref 8–23)
CO2: 24 mmol/L (ref 22–32)
Calcium: 8.7 mg/dL — ABNORMAL LOW (ref 8.9–10.3)
Chloride: 118 mmol/L — ABNORMAL HIGH (ref 98–111)
Creatinine, Ser: 0.7 mg/dL (ref 0.61–1.24)
GFR, Estimated: >60 mL/min (ref >60)
Glucose, Bld: 138 mg/dL — ABNORMAL HIGH (ref 70–99)
Potassium: 3.6 mmol/L (ref 3.5–5.1)
Sodium: 154 mmol/L — ABNORMAL HIGH (ref 135–145)

## 2020-03-12 LAB — URINE CULTURE: Culture: NO GROWTH

## 2020-03-12 LAB — MAGNESIUM: Magnesium: 1.9 mg/dL (ref 1.7–2.4)

## 2020-03-12 LAB — LACTIC ACID, PLASMA: Lactic Acid, Venous: 1.4 mmol/L (ref 0.5–1.9)

## 2020-03-12 MED ORDER — CHLORHEXIDINE GLUCONATE 0.12 % MT SOLN
15.0000 mL | Freq: Two times a day (BID) | OROMUCOSAL | Status: DC
  Administered 2020-03-12 – 2020-03-18 (×12): 15 mL via OROMUCOSAL
  Filled 2020-03-12 (×11): qty 15

## 2020-03-12 MED ORDER — ORAL CARE MOUTH RINSE
15.0000 mL | Freq: Two times a day (BID) | OROMUCOSAL | Status: DC
  Administered 2020-03-13 – 2020-03-18 (×10): 15 mL via OROMUCOSAL

## 2020-03-12 MED ORDER — FREE WATER
200.0000 mL | Status: AC
  Administered 2020-03-12 (×4): 200 mL

## 2020-03-12 MED ORDER — FREE WATER
200.0000 mL | Status: DC
  Administered 2020-03-12 – 2020-03-18 (×34): 200 mL

## 2020-03-12 NOTE — ED Notes (Signed)
Patient had bowel movement. Patient cleaned and new brief placed.

## 2020-03-12 NOTE — ED Notes (Signed)
ED TO INPATIENT HANDOFF REPORT  ED Nurse Name and Phone #: [phone]  S Name/Age/Gender Devon Peterson 65 y.o. male Room/Bed: WA09/WA09  Code Status   Code Status: Full Code  Home/SNF/Other Nursing Home Patient oriented to: self, place, time and situation Is this baseline? Yes   Triage Complete: Triage complete  Chief Complaint Sepsis [facility]) [A41.9]  Triage Note Per [facility], coming from [facility], RN at facility states SOB-placed of 2L Gem-history of lung cancer-just had radiation yesterday-covid test was negative    Allergies Allergies  Allergen Reactions  . Albumin (Human) Itching    May be able to handle at low rates.    Level of Care/Admitting Diagnosis ED Disposition    ED Disposition Condition Comment   Admit  Hospital Area: [facility] [[id number]]  Level of Care: Progressive [102]  Admit to Progressive based on following criteria: MULTISYSTEM THREATS such as stable sepsis, metabolic/electrolyte imbalance with or without encephalopathy that is responding to early treatment.  May admit patient to [facility] or [facility] if equivalent level of care is available:: No  Covid Evaluation: Confirmed COVID Negative  Diagnosis: Sepsis [facility]) [[id number]]  Admitting Physician: Jonnie Finner [[id number]]  Attending Physician: Jonnie Finner [[id number]]  Estimated length of stay: past midnight tomorrow  Certification:: I certify this patient will need inpatient services for at least 2 midnights       B Medical/Surgery History Past Medical History:  Diagnosis Date  . Cancer ([place])   . COPD (chronic obstructive pulmonary disease) ([place])   . Medical history non-contributory    Past Surgical History:  Procedure Laterality Date  . BIOPSY  08/20/2019   Procedure: BIOPSY;  Surgeon: Chesley Mires, MD;  Location: [facility];  Service: Endoscopy;;  . BRONCHIAL WASHINGS  08/20/2019   Procedure: BRONCHIAL WASHINGS;  Surgeon: Chesley Mires, MD;  Location: [facility];   Service: Endoscopy;;  LLL BAL  . DIRECT LARYNGOSCOPY N/A 07/09/2019   Procedure: DIRECT LARYNGOSCOPY WITH BIOPSY;  Surgeon: Jerrell Belfast, MD;  Location: [facility] ORS;  Service: ENT;  Laterality: N/A;  . IR FLUORO RM 30-60 MIN  07/13/2019  . IR GASTROSTOMY TUBE MOD SED  07/14/2019  . IR REPLACE G-TUBE SIMPLE WO FLUORO  10/09/2019  . NO PAST SURGERIES    . TRACHEOSTOMY TUBE PLACEMENT N/A 07/09/2019   Procedure: TRACHEOSTOMY;  Surgeon: Jerrell Belfast, MD;  Location: [facility];  Service: ENT;  Laterality: N/A;  . VIDEO BRONCHOSCOPY Left 08/20/2019   Procedure: VIDEO BRONCHOSCOPY WITH FLUORO;  Surgeon: Chesley Mires, MD;  Location[facility];  Service: Endoscopy;  Laterality: Left;     A IV Location/Drains/Wounds Patient Lines/Drains/Airways Status    Active Line/Drains/Airways    Name Placement date Placement time Site Days   Peripheral IV 11/02/19 Left Antecubital 11/02/19  1507  Antecubital  131   Peripheral IV 03/10/20 Anterior;Left Forearm 03/10/20  1207  Forearm  2   Peripheral IV 03/10/20 Anterior;Right Forearm 03/10/20  1220  Forearm  2   Gastrostomy/Enterostomy Percutaneous endoscopic gastrostomy (PEG) 18 Fr. LUQ 10/09/19  1323  LUQ  155   External Urinary Catheter 07/25/19  0435  -  231   External Urinary Catheter 03/11/20  0814  -  1   Pressure Injury 07/07/19 Sacrum Mid;Upper Stage 2 -  Partial thickness loss of dermis presenting as a shallow open injury with a red, pink wound bed without slough. 3 each small separate open areas 07/07/19  2107  - 249  Intake/Output Last 24 hours  Intake/Output Summary (Last 24 hours) at 03/12/2020 1656 Last data filed at 03/12/2020 0900 Gross per 24 hour  Intake 3174.08 ml  Output 1600 ml  Net 1574.08 ml    Labs/Imaging Results for orders placed or performed during the hospital encounter of 03/10/20 (from the past 48 hour(s))  Basic metabolic panel     Status: Abnormal   Collection Time: 03/10/20  5:53 PM  Result Value Ref Range    Sodium 155 (H) 135 - 145 mmol/L   Potassium 4.8 3.5 - 5.1 mmol/L   Chloride 112 (H) 98 - 111 mmol/L   CO2 32 22 - 32 mmol/L   Glucose, Bld 124 (H) 70 - 99 mg/dL    Comment: Glucose reference range applies only to samples taken after fasting for at least 8 hours.   BUN 38 (H) 8 - 23 mg/dL   Creatinine, Ser 0.93 0.61 - 1.24 mg/dL   Calcium 8.9 8.9 - 10.3 mg/dL   GFR, Estimated >60 >60 mL/min    Comment: (NOTE) Calculated using the CKD-EPI Creatinine Equation (2021)    Anion gap 11 5 - 15    Comment: Performed at [facility], [place] [address] [place], [place] [address]  MRSA PCR Screening     Status: None   Collection Time: 03/10/20  8:38 PM   Specimen: Nasopharyngeal  Result Value Ref Range   MRSA by PCR NEGATIVE NEGATIVE    Comment:        The GeneXpert MRSA Assay (FDA approved for NASAL specimens only), is one component of a comprehensive MRSA colonization surveillance program. It is not intended to diagnose MRSA infection nor to guide or monitor treatment for MRSA infections. Performed at [facility], [place] [address] [place], [place] [address]   Protime-INR     Status: None   Collection Time: 03/11/20  3:37 AM  Result Value Ref Range   Prothrombin Time 13.3 11.4 - 15.2 seconds   INR 1.1 0.8 - 1.2    Comment: (NOTE) INR goal varies based on device and disease states. Performed at [facility], [place] [address] [place], [place] [address]   Cortisol-am, blood     Status: Abnormal   Collection Time: 03/11/20  3:37 AM  Result Value Ref Range   Cortisol - AM 22.9 (H) 6.7 - 22.6 ug/dL    Comment: Performed at [place] [address] [place], [place] [address]  Basic metabolic panel     Status: Abnormal   Collection Time: 03/11/20  5:08 AM  Result Value Ref Range   Sodium 156 (H) 135 - 145 mmol/L   Potassium 4.0 3.5 - 5.1 mmol/L   Chloride 116 (H) 98 - 111 mmol/L   CO2 30 22 - 32 mmol/L   Glucose, Bld  96 70 - 99 mg/dL    Comment: Glucose reference range applies only to samples taken after fasting for at least 8 hours.   BUN 24 (H) 8 - 23 mg/dL   Creatinine, Ser 0.78 0.61 - 1.24 mg/dL   Calcium 9.3 8.9 - 10.3 mg/dL   GFR, Estimated >60 >60 mL/min    Comment: (NOTE) Calculated using the CKD-EPI Creatinine Equation (2021)    Anion gap 10 5 - 15    Comment: Performed at [facility], [place] [address], [place], [place] [address]  Procalcitonin     Status: None   Collection Time: 03/11/20  5:08 AM  Result  Value Ref Range   Procalcitonin 0.57 ng/mL    Comment:        Interpretation: PCT > 0.5 ng/mL and <= 2 ng/mL: Systemic infection (sepsis) is possible, but other conditions are known to elevate PCT as well. (NOTE)       Sepsis PCT Algorithm           Lower Respiratory Tract                                      Infection PCT Algorithm    ----------------------------     ----------------------------         PCT < 0.25 ng/mL                PCT < 0.10 ng/mL          Strongly encourage             Strongly discourage   discontinuation of antibiotics    initiation of antibiotics    ----------------------------     -----------------------------       PCT 0.25 - 0.50 ng/mL            PCT 0.10 - 0.25 ng/mL               OR       >80% decrease in PCT            Discourage initiation of                                            antibiotics      Encourage discontinuation           of antibiotics    ----------------------------     -----------------------------         PCT >= 0.50 ng/mL              PCT 0.26 - 0.50 ng/mL                AND       <80% decrease in PCT             Encourage initiation of                                             antibiotics       Encourage continuation           of antibiotics    ----------------------------     -----------------------------        PCT >= 0.50 ng/mL                  PCT > 0.50 ng/mL               AND         increase in  PCT                  Strongly encourage                                      initiation of antibiotics    Strongly encourage escalation  of antibiotics                                     -----------------------------                                           PCT <= 0.25 ng/mL                                                 OR                                        > 80% decrease in PCT                                      Discontinue / Do not initiate                                             antibiotics  Performed at [place] [address] [place], [place] [address]   CBC with Differential/Platelet     Status: Abnormal   Collection Time: 03/12/20  3:48 AM  Result Value Ref Range   WBC 7.2 4.0 - 10.5 K/uL   RBC 4.54 4.22 - 5.81 MIL/uL   Hemoglobin 10.8 (L) 13.0 - 17.0 g/dL   HCT 36.0 (L) 39.0 - 52.0 %   MCV 79.3 (L) 80.0 - 100.0 fL   MCH 23.8 (L) 26.0 - 34.0 pg   MCHC 30.0 30.0 - 36.0 g/dL   RDW 16.1 (H) 11.5 - 15.5 %   Platelets 270 150 - 400 K/uL   nRBC 0.4 (H) 0.0 - 0.2 %   Neutrophils Relative % 84 %   Neutro Abs 6.0 1.7 - 7.7 K/uL   Lymphocytes Relative 10 %   Lymphs Abs 0.7 0.7 - 4.0 K/uL   Monocytes Relative 4 %   Monocytes Absolute 0.3 0.1 - 1.0 K/uL   Eosinophils Relative 1 %   Eosinophils Absolute 0.1 0.0 - 0.5 K/uL   Basophils Relative 0 %   Basophils Absolute 0.0 0.0 - 0.1 K/uL   Immature Granulocytes 1 %   Abs Immature Granulocytes 0.08 (H) 0.00 - 0.07 K/uL    Comment: Performed at [facility], [place] [address] [place], [place] [address]  Basic metabolic panel     Status: Abnormal   Collection Time: 03/12/20  3:48 AM  Result Value Ref Range   Sodium 154 (H) 135 - 145 mmol/L   Potassium 3.6 3.5 - 5.1 mmol/L   Chloride 118 (H) 98 - 111 mmol/L   CO2 24 22 - 32 mmol/L   Glucose, Bld 138 (H) 70 - 99 mg/dL    Comment: Glucose reference range applies only to samples taken after fasting for at least 8  hours.   BUN 21 8 - 23 mg/dL   Creatinine, Ser 0.70 0.61 -  1.24 mg/dL   Calcium 8.7 (L) 8.9 - 10.3 mg/dL   GFR, Estimated >60 >60 mL/min    Comment: (NOTE) Calculated using the CKD-EPI Creatinine Equation (2021)    Anion gap 12 5 - 15    Comment: Performed at [facility], [place] [address] [place], [place] [address]  Magnesium     Status: None   Collection Time: 03/12/20  3:48 AM  Result Value Ref Range   Magnesium 1.9 1.7 - 2.4 mg/dL    Comment: Performed at [facility], [place] [address], [place], [place] [address]  Lactic acid, plasma     Status: None   Collection Time: 03/12/20  3:48 AM  Result Value Ref Range   Lactic Acid, Venous 1.4 0.5 - 1.9 mmol/L    Comment: Performed at [facility], [place] [address] [place], [place] [address]   CT Angio Chest PE W and/or Wo Contrast  Result Date: 03/10/2020 CLINICAL DATA:  Known lung cancer currently undergoing radiation therapy (last yesterday). Sepsis, tachycardia and hypoxia. COVID test was negative. Evaluate for pneumonia versus pulmonary embolus. EXAM: CT ANGIOGRAPHY CHEST WITH CONTRAST TECHNIQUE: Multidetector CT imaging of the chest was performed using the standard protocol during bolus administration of intravenous contrast. Multiplanar CT image reconstructions and MIPs were obtained to evaluate the vascular anatomy. CONTRAST:  138m OMNIPAQUE IOHEXOL 350 MG/ML SOLN COMPARISON:  Recent PET-CT 11/26/2019; CT biopsy 02/11/2020 FINDINGS: Cardiovascular: Satisfactory opacification of the pulmonary arteries to the segmental level. No evidence of pulmonary embolism. Normal heart size. No pericardial effusion. Coronary artery calcifications. Mediastinum/Nodes: No enlarged mediastinal, hilar, or axillary lymph nodes. Thyroid gland, trachea, and esophagus demonstrate no significant findings. Lungs/Pleura: Enlarging right upper lobe pulmonary nodule measures up to 0.9 cm and is highly concerning for  metastatic disease. Enlarging nodule within the superior segment of the right lower lobe now measures 0.5 cm. Enlarging right lower lobe pulmonary nodule now measuring up to 0.7 cm. Interval development of extensive consolidative airspace opacities throughout the left lower lobe with associated volume loss. The left mainstem bronchus is completely obscured and may be stenosed or impacted with debris. The previously identified mass can be vaguely identified as low attenuation in the center of the consolidated left lower lobe. Background of centrilobular pulmonary emphysema. No pleural effusion. Upper Abdomen: No acute abnormality. Percutaneous gastrostomy tube in place. Musculoskeletal: No chest wall abnormality. No acute or significant osseous findings. Review of the MIP images confirms the above findings. IMPRESSION: 1. Negative for acute pulmonary embolus. 2. Interval development of dense consolidation and associated volume loss within the left lower lobe. The left lower lobe bronchus is also completely obscured and is likely impacted with debris. Findings are most concerning for lobar pneumonia. Acute radiation pneumonitis is possible but considered less likely given the timing (radiation therapy is still on going). 3. At least 3 enlarging pulmonary nodules are identified in the right upper and lower lobes. These were barely identifiable as punctate foci on the prior PET-CT. Enlargement over the past 3 months is highly concerning for metastatic disease. 4. Coronary artery calcifications. 5. Percutaneous gastrostomy tube in place. 6. Centrilobular pulmonary emphysema. Emphysema (ICD10-J43.9). Electronically Signed   By: HJacqulynn CadetM.D.   On: 03/10/2020 17:23    Pending Labs Unresulted Labs (From admission, onward)          Start     Ordered   03/17/20 0500  Creatinine, serum  (enoxaparin (LOVENOX)    CrCl >/= 30 ml/min)  Weekly,   R     Comments: while on enoxaparin therapy    03/11/20 0125    03/13/20 1694  Basic metabolic panel  Daily,   R      03/12/20 1258   03/13/20 0500  Magnesium  Tomorrow morning,   STAT        03/12/20 1258   03/11/20 1759  Expectorated sputum assessment w rflx to resp cult  Once,   R        03/11/20 1758   03/11/20 0500  Creatinine, serum  Every 48 hours,   R     Comments: While on vancomycin - may discontinue once vanc stopped    03/10/20 1941          Vitals/Pain Today's Vitals   03/12/20 1100 03/12/20 1230 03/12/20 1404 03/12/20 1500  BP: 107/76 105/70  111/67  Pulse: 95 81  88  Resp: (!) 28 (!) 27  (!) 30  Temp:      TempSrc:      SpO2: 100% 97%  97%  Weight:      PainSc:   4      Isolation Precautions No active isolations  Medications Medications  Ipratropium-Albuterol (COMBIVENT) respimat 1 puff (1 puff Inhalation Given 03/12/20 1403)  oxyCODONE (Oxy IR/ROXICODONE) immediate release tablet 5 mg (5 mg Oral Given 03/12/20 1133)  senna (SENOKOT) tablet 17.2 mg (17.2 mg Oral Not Given 03/12/20 1126)  thiamine tablet 100 mg (100 mg Oral Given 03/12/20 1126)  nystatin (MYCOSTATIN) 100000 UNIT/ML suspension 500,000 Units (has no administration in time range)  enoxaparin (LOVENOX) injection 40 mg (40 mg Subcutaneous Given 03/12/20 1125)  metroNIDAZOLE (FLAGYL) IVPB 500 mg (0 mg Intravenous Stopped 03/12/20 1509)  acetaminophen (TYLENOL) tablet 650 mg (has no administration in time range)    Or  acetaminophen (TYLENOL) suppository 650 mg (has no administration in time range)  0.9 %  sodium chloride infusion ( Intravenous Rate/Dose Verify 03/12/20 1404)  ceFEPIme (MAXIPIME) 2 g in sodium chloride 0.9 % 100 mL IVPB (2 g Intravenous New Bag/Given 03/12/20 1510)  feeding supplement (OSMOLITE 1.5 CAL) liquid 1,000 mL ( Per Tube Canceled Entry 03/12/20 1342)  feeding supplement (PROSource TF) liquid 45 mL (45 mLs Per Tube Given 03/12/20 1125)  free water 200 mL (0 mLs Per Tube Hold 03/12/20 1506)  lactated ringers bolus 1,000 mL (0 mLs Intravenous Stopped 03/10/20  1402)    And  lactated ringers bolus 500 mL (0 mLs Intravenous Stopped 03/10/20 1749)    And  lactated ringers bolus 250 mL (0 mLs Intravenous Stopped 03/10/20 1749)  ceFEPIme (MAXIPIME) 2 g in sodium chloride 0.9 % 100 mL IVPB (0 g Intravenous Stopped 03/10/20 1247)  vancomycin (VANCOCIN) IVPB 1000 mg/200 mL premix (0 mg Intravenous Stopped 03/10/20 1331)  metroNIDAZOLE (FLAGYL) IVPB 500 mg (0 mg Intravenous Stopped 03/10/20 1456)  iohexol (OMNIPAQUE) 350 MG/ML injection 100 mL (100 mLs Intravenous Contrast Given 03/10/20 1648)    Mobility non-ambulatory High fall risk   Focused Assessments Pulmonary Assessment Handoff:  Lung sounds:   O2 Device: Nasal Cannula O2 Flow Rate (L/min): 2 L/min      R Recommendations: See Admitting Provider Note  Report given to:   Additional Notes:

## 2020-03-12 NOTE — Progress Notes (Signed)
PROGRESS NOTE    Devon Peterson  [id number] DOB: Jan 12, 1956 DOA: 03/10/2020 PCP: Patient, No Pcp Per    Chief Complaint  Patient presents with  . 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 fib and aspiration pneumonia, he is sent from nursing home to [place] [facility] ED due to short of breath, cough, increased sputum production, fever and hypoxia  CT A no PE, + left lower lobe lobar pneumonia, left lower lobe bronchus completely obscured and is likely impacted with debris , CTA also concern new metastatic lung lesions right upper and lower lobes  UA concerning for UTI  Subjective:  Feeling better, no fever last 24hrs, tolerating tube feeds  denies pain  Assessment & Plan:   Active Problems:   Sepsis ([place])   Sepsis present on admission with UTI and lobar pneumonia  (aspiration pneumonia due to debris seen in left lower lobe bronchus) -COVID-19 screen negative -MRSA screen negative -blood culture no growth - attempt to obtain sputum culture -DC vancomycin, continue cefepime and Flagyl for now  Acute hypoxic respiratory failure likely due to lobar pneumonia and sepsis Treat underlying etiology, oxygen supplement  History of COPD Currently no wheezing  UTI Urine culture no growth, already on antibiotics Currently has Foley catheter  Hypernatremia Likely due to dehydration, continue normal saline, start free water flushes per tube Free water deficit still 2.5 L, increase free water flush to 200 cc per 2 hours  H/o Afib/aflutter Metoprolol held due to sepsis Resume metoprolol with holding parameter CHADsvacs score 0.  Severe malnutrition, underweight Body mass index is  15.37 kg/m.  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 Benay Spice  Palliative care  Procedures:   None  Antimicrobials:   Vanco x1 on admission Cefepime and Flagyl since admission     Objective: Vitals:   03/12/20 0500 03/12/20 0530 03/12/20 0600 03/12/20 0755  BP: 128/83 128/85 126/76 120/88  Pulse: 83 (!) 110 100 99  Resp: (!) 30 (!) 29 (!) 29 (!) 33  Temp:    97.9 F (36.6 C)  TempSrc:    Oral  SpO2: 100% 98% 100% 100%  Weight:        Intake/Output Summary (Last 24 hours) at 03/12/2020 0920 Last data filed at 03/12/2020 0900 Gross per 24 hour  Intake 3374.08 ml  Output 1600 ml  Net 1774.08 ml   Danley Danker  Weights   03/10/20 1042  Weight: 50 kg    Examination:  General exam: Malnourished, positive PEG, + Foley Respiratory system: Positive rhonchi, normal respiratory effort, Cardiovascular system: S1 & S2 heard, RRR. No pedal edema. Gastrointestinal system: Abdomen is nondistended, soft and nontender.. Normal bowel sounds heard, PEG tube in place Central nervous system: Alert and oriented. No focal neurological deficits. Extremities: Symmetric, generalized weakness Skin: No rashes, lesions or ulcers Psychiatry: Judgement and insight appear normal. Mood & affect appropriate.     Data Reviewed: I  have personally reviewed following labs and imaging studies  CBC: Recent Labs  Lab 03/10/20 1007 03/12/20 0348  WBC 12.5* 7.2  NEUTROABS  --  6.0  HGB 11.9* 10.8*  HCT 39.2 36.0*  MCV 77.6* 79.3*  PLT 350 643    Basic Metabolic Panel: Recent Labs  Lab 03/10/20 1007 03/10/20 1753 03/11/20 0508 03/12/20 0348  NA 159* 155* 156* 154*  K 4.3 4.8 4.0 3.6  CL 110 112* 116* 118*  CO2 31 32 30 24  GLUCOSE 134* 124* 96 138*  BUN 49* 38* 24* 21  CREATININE 1.08 0.93 0.78 0.70  CALCIUM 9.9 8.9 9.3 8.7*  MG  --   --   --  1.9    GFR: Estimated Creatinine Clearance: 66 mL/min (by C-G formula based on SCr of 0.7 mg/dL).  Liver Function Tests: Recent Labs  Lab 03/10/20 1055  AST 28  ALT 18  ALKPHOS 108  BILITOT 0.4  PROT 9.7*  ALBUMIN 3.3*    CBG: No results for input(s): GLUCAP in the last 168 hours.   Recent Results (from the past 240 hour(s))  Blood Culture (routine x 2)     Status: None (Preliminary result)   Collection Time: 03/10/20 12:15 PM   Specimen: BLOOD LEFT FOREARM  Result Value Ref Range Status   Specimen Description BLOOD LEFT FOREARM  Final   Special Requests   Final    BOTTLES DRAWN AEROBIC AND ANAEROBIC Blood Culture adequate volume Performed at [facility], [place] [address] [place], [place] [address]    Culture PENDING  Incomplete   Report Status PENDING  Incomplete  Blood Culture (routine x 2)     Status: None (Preliminary result)   Collection Time: 03/10/20 12:15 PM   Specimen: BLOOD RIGHT FOREARM  Result Value Ref Range Status   Specimen Description   Final    BLOOD RIGHT FOREARM Performed at [facility], [place] [address] [place], [place] [address]    Special Requests   Final    BOTTLES DRAWN AEROBIC AND ANAEROBIC Blood Culture results may not be optimal due to an inadequate volume of blood received in culture bottles Performed at [place] [address] [place],  [address]    Culture PENDING   Incomplete   Report Status PENDING  Incomplete  Resp Panel by RT-PCR (Flu A&B, Covid) Nasopharyngeal Swab     Status: None   Collection Time: 03/10/20  1:00 PM   Specimen: Nasopharyngeal Swab; Nasopharyngeal(NP) swabs in vial transport medium  Result Value Ref Range Status   SARS Coronavirus 2 by RT PCR NEGATIVE NEGATIVE Final    Comment: (NOTE) SARS-CoV-2 target nucleic acids are NOT DETECTED.  The SARS-CoV-2 RNA is generally detectable in upper respiratory specimens during the acute phase of infection. The lowest concentration of SARS-CoV-2 viral copies this assay can detect is 138 copies/mL. A negative result does not preclude SARS-Cov-2 infection and should not be used  as the sole basis for treatment or other patient management decisions. A negative result may occur with  improper specimen collection/handling, submission of specimen other than nasopharyngeal swab, presence of viral mutation(s) within the areas targeted by this assay, and inadequate number of viral copies(<138 copies/mL). A negative result must be combined with clinical observations, patient history, and epidemiological information. The expected result is Negative.  Fact Sheet for Patients:  EntrepreneurPulse.com.au  Fact Sheet for Healthcare Providers:  IncredibleEmployment.be  This test is no t yet approved or cleared by the [place] FDA and  has been authorized for detection and/or diagnosis of SARS-CoV-2 by FDA under an Emergency Use Authorization (EUA). This EUA will remain  in effect (meaning this test can be used) for the duration of the COVID-19 declaration under Section 564(b)(1) of the Act, 21 U.S.C.section 360bbb-3(b)(1), unless the authorization is terminated  or revoked sooner.       Influenza A by PCR NEGATIVE NEGATIVE Final   Influenza B by PCR NEGATIVE NEGATIVE Final    Comment: (NOTE) The Xpert Xpress SARS-CoV-2/FLU/RSV plus assay is intended as an  aid in the diagnosis of influenza from Nasopharyngeal swab specimens and should not be used as a sole basis for treatment. Nasal washings and aspirates are unacceptable for Xpert Xpress SARS-CoV-2/FLU/RSV testing.  Fact Sheet for Patients: EntrepreneurPulse.com.au  Fact Sheet for Healthcare Providers: IncredibleEmployment.be  This test is not yet approved or cleared by the [place] FDA and has been authorized for detection and/or diagnosis of SARS-CoV-2 by FDA under an Emergency Use Authorization (EUA). This EUA will remain in effect (meaning this test can be used) for the duration of the COVID-19 declaration under Section 564(b)(1) of the Act, 21 U.S.C. section 360bbb-3(b)(1), unless the authorization is terminated or revoked.  Performed at [facility], [place] [address] [place], [place] [address]   Urine culture     Status: None   Collection Time: 03/10/20  2:00 PM   Specimen: In/Out Cath Urine  Result Value Ref Range Status   Specimen Description   Final    IN/OUT CATH URINE Performed at [place] [address] [place], [place] [address]    Special Requests   Final    NONE Performed at [facility], [place] [address] [place], [place] [address]    Culture   Final    NO GROWTH Performed at [facility], [place] [address] [place], [place] [address]    Report Status 03/12/2020 FINAL  Final  MRSA PCR Screening     Status: None   Collection Time: 03/10/20  8:38 PM   Specimen: Nasopharyngeal  Result Value Ref Range Status   MRSA by PCR NEGATIVE NEGATIVE Final    Comment:        The GeneXpert MRSA Assay (FDA approved for NASAL specimens only), is one component of a comprehensive MRSA colonization surveillance program. It is not intended to diagnose MRSA infection nor to guide or monitor treatment for MRSA infections. Performed at [facility],  [place] [address], [place], [place] [address]          Radiology Studies: DG Chest 2 View  Result Date: 03/10/2020 CLINICAL DATA:  History of lung carcinoma.  Shortness of breath EXAM: CHEST - 2 VIEW COMPARISON:  Chest CT November 02, 2019; chest radiograph August 20, 2019. PET-CT November 26, 2019 FINDINGS: There is opacity in the posterior left base at the site of known mass. Mass is ill-defined by  current radiographic examination with suspected surrounding ill-defined pneumonitis. Elsewhere no edema or airspace opacity evident. Heart size and pulmonary vascular normal. No adenopathy. No bone lesions. IMPRESSION: Airspace opacity posterior left base, likely due to combination of mass and surrounding pneumonitis. Lungs elsewhere clear. Heart size normal. No adenopathy evident. Electronically Signed   By: Lowella Grip III M.D.   On: 03/10/2020 10:26   CT Angio Chest PE W and/or Wo Contrast  Result Date: 03/10/2020 CLINICAL DATA:  Known lung cancer currently undergoing radiation therapy (last yesterday). Sepsis, tachycardia and hypoxia. COVID test was negative. Evaluate for pneumonia versus pulmonary embolus. EXAM: CT ANGIOGRAPHY CHEST WITH CONTRAST TECHNIQUE: Multidetector CT imaging of the chest was performed using the standard protocol during bolus administration of intravenous contrast. Multiplanar CT image reconstructions and MIPs were obtained to evaluate the vascular anatomy. CONTRAST:  133mL OMNIPAQUE IOHEXOL 350 MG/ML SOLN COMPARISON:  Recent PET-CT 11/26/2019; CT biopsy 02/11/2020 FINDINGS: Cardiovascular: Satisfactory opacification of the pulmonary arteries to the segmental level. No evidence of pulmonary embolism. Normal heart size. No pericardial effusion. Coronary artery calcifications. Mediastinum/Nodes: No enlarged mediastinal, hilar, or axillary lymph nodes. Thyroid gland, trachea, and esophagus demonstrate no significant findings. Lungs/Pleura: Enlarging right upper lobe pulmonary  nodule measures up to 0.9 cm and is highly concerning for metastatic disease. Enlarging nodule within the superior segment of the right lower lobe now measures 0.5 cm. Enlarging right lower lobe pulmonary nodule now measuring up to 0.7 cm. Interval development of extensive consolidative airspace opacities throughout the left lower lobe with associated volume loss. The left mainstem bronchus is completely obscured and may be stenosed or impacted with debris. The previously identified mass can be vaguely identified as low attenuation in the center of the consolidated left lower lobe. Background of centrilobular pulmonary emphysema. No pleural effusion. Upper Abdomen: No acute abnormality. Percutaneous gastrostomy tube in place. Musculoskeletal: No chest wall abnormality. No acute or significant osseous findings. Review of the MIP images confirms the above findings. IMPRESSION: 1. Negative for acute pulmonary embolus. 2. Interval development of dense consolidation and associated volume loss within the left lower lobe. The left lower lobe bronchus is also completely obscured and is likely impacted with debris. Findings are most concerning for lobar pneumonia. Acute radiation pneumonitis is possible but considered less likely given the timing (radiation therapy is still on going). 3. At least 3 enlarging pulmonary nodules are identified in the right upper and lower lobes. These were barely identifiable as punctate foci on the prior PET-CT. Enlargement over the past 3 months is highly concerning for metastatic disease. 4. Coronary artery calcifications. 5. Percutaneous gastrostomy tube in place. 6. Centrilobular pulmonary emphysema. Emphysema (ICD10-J43.9). Electronically Signed   By: Jacqulynn Cadet M.D.   On: 03/10/2020 17:23        Scheduled Meds: . enoxaparin (LOVENOX) injection  40 mg Subcutaneous Q24H  . feeding supplement (OSMOLITE 1.5 CAL)  1,000 mL Per Tube Q24H  . feeding supplement (PROSource TF)   45 mL Per Tube Daily  . free water  200 mL Per Tube Q2H  . Ipratropium-Albuterol  1 puff Inhalation TID  . senna  2 tablet Oral Daily  . thiamine  100 mg Oral Daily   Continuous Infusions: . sodium chloride 125 mL/hr at 03/12/20 0339  . ceFEPime (MAXIPIME) IV 2 g (03/12/20 0522)  . metronidazole Stopped (03/12/20 0221)     LOS: 2 days   Time spent: 32mins,  Greater than 50% of this time was spent in counseling, explanation of  diagnosis, planning of further management, and coordination of care.  I have personally reviewed and interpreted on  03/12/2020 daily labs, tele strips, imagings as discussed above under date review session and assessment and plans.  I reviewed all nursing notes, pharmacy notes, consultant notes,  vitals, pertinent old records  I have discussed plan of care as described above with RN , patient on 03/12/2020  Voice Recognition /Dragon dictation system was used to create this note, attempts have been made to correct errors. Please contact the author with questions and/or clarifications.   Florencia Reasons, MD PhD FACP [facility]  Available via Epic secure chat 7am-7pm for nonurgent issues Please page for urgent issues To page the attending provider between 7A-7P or the covering provider during after hours 7P-7A, please log into the web site [email or web address] and access using universal [place] password for that web site. If you do not have the password, please call the hospital operator.    03/12/2020, 9:20 AM

## 2020-03-12 NOTE — ED Notes (Signed)
Patient incontinent of stool at this time. Patient cleaned up and new brief applied; no other acute needs identified.

## 2020-03-12 NOTE — Consult Note (Signed)
Consultation Note Date: 03/12/2020   Patient Name: Devon Peterson  DOB: Jul 24, 1955  MRN: [id number]  Age / Sex: 65 y.o., male  PCP: Patient, No Pcp Per Referring Physician: Florencia Reasons, MD  Reason for Consultation: Establishing goals of care  HPI/Patient Profile: 65 y.o. 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 CTA without PE but reveals left lower lobe pneumonia, LLL bronchus completely obscured ? Debris, and concern for new metastatic lung lesions of RUL and RLL. UA concerning for UTI. Started on antibiotics. Oncology following, well known to Dr. Benay Spice. Poor candidate for chemotherapy due to poor nutritional and functional status. Palliative medicine consultation for goals of care.   Clinical Assessment and Goals of Care:  I have reviewed medical records, discussed with care team, and met with patient at bedside. [name] is awake, alert, oriented and able to participate in discussion. He appears very weak and deconditioned. He is mildly short of breath at rest but reports he feels slightly better than yesterday. Denies current pain. No family at bedside. Patient known to PMT from previous admissions.   I introduced Palliative Medicine as specialized medical care for people living with serious illness. It focuses on providing relief from the symptoms and stress of a serious illness. The goal is to improve quality of life for both the patient and the family.  We discussed a brief life review of the patient. Since health decline last year, has been living at [facility]. Patient reports  they are in the process of converting him to long-term care.   Discussed his journey since cancer diagnoses. Discussed events leading up to admission and course of hospitalization including diagnoses, interventions, plan of care. Patient recalls his conversation with Dr. Benay Spice and Erasmo Downer NP yesterday and plan for ongoing radiation and outpatient oncology f/u. He understands he is too weak for consideration of chemotherapy.   Advanced directives and concepts specific to code status were discussed. Patient confirms his sister Devon Peterson is documented POA. Documentation is uploaded in EMR. Reviewed. At this point, patient does desire full code status and would at least wish for interventions to be attempted. He speaks that if they are unsuccessful, to let him go.  Explained importance of ongoing discussions with his sister regarding his wishes, especially with concern for worsening cancer and unlikelihood that aggressive measures would change the outcome if/when his condition worsens.   Patient gives this NP permission to contact his sister to provide update. She lives in [place] area.    Questions and concerns addressed. Patient has no complaints and resting comfortably, watching tv.  **This afternoon, reached his sister Devon Peterson. Provided update on [name]'s condition including diagnoses, interventions, plan of care, and concern with worsening cancer due to progression on  CT scan. Explained plan for management of acute infections and f/u with radiation oncology and oncology outpatient. Devon Peterson also acknowledges her understanding that he would be a poor candidate for chemotherapy due to functional status. Devon Peterson confirms his decision for her to act as POA if he is unable to make decisions. Discussed importance of ongoing discussions regarding his goals/wishes especially once oncology recommendations and prognosis are further determined. Expressed concern that with worsening cancer, interventions such as  CPR and life-support would not change the outcome or reverse the nature of his condition. Devon Peterson is appreciative of update and frequently communicates with her brother.     SUMMARY OF RECOMMENDATIONS    Continue full code/full scope per patient wishes.  Patient has documented living will/POA uploaded into EMR. Confirms his sister, Devon Peterson is documented HCPOA. This PMT also spoke with sister this afternoon. Encouraged patient and sister to continue conversations regarding his goals/wishes, especially when more information is determined by oncology (stage of cancer/prognosis/options).    Outpatient oncology and radiation oncology f/u.  Outpatient palliative referral at SNF. Anticipate he may need hospice services in the near future with CT concerning for metastatic disease, poor functional/nutritional status, and poor candidacy for chemotherapy.   Code Status/Advance Care Planning:  Full code  Symptom Management:   Per attending  Palliative Prophylaxis:   Aspiration, Bowel Regimen, Delirium Protocol, Frequent Pain Assessment, Oral Care and Turn Reposition  Psycho-social/Spiritual:   Desire for further Chaplaincy support:yes  Additional Recommendations: Caregiving  Support/Resources, Compassionate Wean Education and Education on Hospice  Prognosis:   Tenuous. Poor long-term with worsening chest CT concerning for metastatic lung cancer  Discharge Planning: Back to [facility] when stable     Primary Diagnoses: Present on Admission: . Sepsis ([place])   I have reviewed the medical record, interviewed the patient and family, and examined the patient. The following aspects are pertinent.  Past Medical History:  Diagnosis Date  . Cancer ([place])   . COPD (chronic obstructive pulmonary disease) ([place])   . Medical history non-contributory    Social History   Socioeconomic History  . Marital status: Single    Spouse name: Not on file  . Number of children: Not on file   . Years of education: Not on file  . Highest education level: Not on file  Occupational History  . Not on file  Tobacco Use  . Smoking status: Former Smoker    Packs/day: 1.00    Years: 39.00    Pack years: 39.00    Types: Cigarettes    Quit date: 07/07/2019    Years since quitting: 0.6  . Smokeless tobacco: Never Used  Substance and Sexual Activity  . Alcohol use: Yes    Alcohol/week: 24.0 standard drinks    Types: 24 Cans of beer per week  . Drug use: Not Currently  . Sexual activity: Not on file  Other Topics Concern  . Not on file  Social History Narrative  . Not on file   Social Determinants of Health   Financial Resource Strain: Not on file  Food Insecurity: Not on file  Transportation Needs: Not on file  Physical Activity: Not on file  Stress: Not on file  Social Connections: Not on file   Family History  Problem Relation Age of Onset  . CAD Father    Scheduled Meds: . enoxaparin (LOVENOX) injection  40 mg Subcutaneous Q24H  . feeding supplement (OSMOLITE 1.5 CAL)  1,000 mL Per Tube Q24H  . feeding supplement (PROSource TF)  45 mL Per Tube Daily  . free water  200 mL Per Tube Q2H  . Ipratropium-Albuterol  1 puff Inhalation TID  . senna  2 tablet Oral Daily  . thiamine  100 mg Oral Daily   Continuous Infusions: . sodium chloride 125 mL/hr at 03/12/20 1404  . ceFEPime (MAXIPIME) IV 2 g (03/12/20 1510)  . metronidazole Stopped (03/12/20 1509)   PRN Meds:.acetaminophen **OR** acetaminophen, nystatin, oxyCODONE Medications Prior to Admission:  Prior to Admission medications   Medication Sig Start Date End Date Taking? Authorizing Provider  Amino Acids-Protein Hydrolys (FEEDING SUPPLEMENT, PRO-STAT SUGAR FREE 64,) LIQD Place 30 mLs into feeding tube 2 (two) times daily. 08/28/19  Yes Patrecia Pour, MD  chlorhexidine gluconate, MEDLINE KIT, (PERIDEX) 0.12 % solution 15 mLs by Mouth Rinse route 2 (two) times daily. 08/28/19  Yes Patrecia Pour, MD  ferrous sulfate  325 (65 FE) MG tablet Take 1 tablet (325 mg total) by mouth daily. 08/28/19 08/27/20 Yes Patrecia Pour, MD  metoprolol tartrate (LOPRESSOR) 25 MG tablet Take 0.5 tablets (12.5 mg total) by mouth 2 (two) times daily. 08/28/19 02/09/20 Yes Patrecia Pour, MD  Nutritional Supplements (ISOSOURCE 1.5 CAL) LIQD Place 237 mLs into feeding tube 5 (five) times daily. Patient taking differently: Place 60 mLs into feeding tube See admin instructions. Give 60 ml per hour in feeding tube for 12 hours a day (1900 - 0700) 08/28/19  Yes Patrecia Pour, MD  nystatin (MYCOSTATIN) 100000 UNIT/ML suspension Take 5 mLs by mouth 4 (four) times daily as needed (thrush).   Yes [provider]  OVER THE COUNTER MEDICATION Take 120 mLs by mouth 2 (two) times daily. 177ml resource twice daily with med pass   Yes [provider]  oxycodone (OXY-IR) 5 MG capsule Take 5 mg by mouth every 8 (eight) hours as needed for pain.    Yes [provider]  pantoprazole (PROTONIX) 40 MG tablet Take 1 tablet (40 mg total) by mouth daily. 08/28/19 08/27/20 Yes Patrecia Pour, MD  polyethylene glycol (MIRALAX / GLYCOLAX) 17 g packet Take 17 g by mouth daily. 08/28/19  Yes Patrecia Pour, MD  senna (SENOKOT) 8.6 MG TABS tablet Take 2 tablets (17.2 mg total) by mouth daily. 08/28/19  Yes Patrecia Pour, MD  sertraline (ZOLOFT) 50 MG tablet Take 1 tablet (50 mg total) by mouth daily. 08/28/19 08/27/20 Yes Patrecia Pour, MD  thiamine 100 MG tablet Take 1 tablet (100 mg total) by mouth daily. 08/28/19  Yes Patrecia Pour, MD   Allergies  Allergen Reactions  . Albumin (Human) Itching    May be able to handle at low rates.   Review of Systems  Constitutional: Positive for activity change and appetite change.  Neurological: Positive for weakness.       Dysphagia   Physical Exam Vitals and nursing note reviewed.  Constitutional:      General: He is awake.     Appearance: He is cachectic. He is ill-appearing.  Cardiovascular:      Rate and Rhythm: Normal rate.  Pulmonary:     Effort: No tachypnea, accessory muscle usage or respiratory distress.     Breath sounds: Decreased breath sounds present.     Comments: [place] Skin:    General: Skin is warm and dry.  Neurological:     Mental Status: He is alert and oriented to person, place, and time.  Psychiatric:        Mood and Affect:  Mood normal.        Cognition and Memory: Cognition normal.    Vital Signs: BP 105/65   Pulse 96   Temp 97.9 F (36.6 C) (Oral)   Resp (!) 23   Wt 50 kg   SpO2 100%   BMI 15.37 kg/m  Pain Scale: 0-10   Pain Score: 4    SpO2: SpO2: 100 % O2 Device:SpO2: 100 % O2 Flow Rate: .O2 Flow Rate (L/min): 2 L/min  IO: Intake/output summary:   Intake/Output Summary (Last 24 hours) at 03/12/2020 1722 Last data filed at 03/12/2020 0900 Gross per 24 hour  Intake 3174.08 ml  Output 1600 ml  Net 1574.08 ml    LBM:   Baseline Weight: Weight: 50 kg Most recent weight: Weight: 50 kg     Palliative Assessment/Data: PPS 40%   Flowsheet Rows   Flowsheet Row Most Recent Value  Intake Tab   Referral Department Hospitalist  Unit at Time of Referral ER  Palliative Care Primary Diagnosis Cancer  Palliative Care Type Return patient Palliative Care  Reason for referral Clarify Goals of Care  Date first seen by Palliative Care 03/12/20  Clinical Assessment   Palliative Performance Scale Score 40%  Psychosocial & Spiritual Assessment   Palliative Care Outcomes   Patient/Family meeting held? Yes  Who was at the meeting? patient, spoke with sister via telephone  Palliative Care Outcomes Clarified goals of care, Provided end of life care assistance, Provided psychosocial or spiritual support, Linked to palliative care logitudinal support, ACP counseling assistance      Time Total: 60 Greater than 50%  of this time was spent counseling and coordinating care related to the above assessment and plan.  Signed by:  Ihor Dow, DNP,  FNP-C Palliative Medicine Team  Phone: [phone] Fax: [phone]   Please contact Palliative Medicine Team phone at [phone] for questions and concerns.  For individual provider: See Shea Evans

## 2020-03-13 DIAGNOSIS — E871 Hypo-osmolality and hyponatremia: Secondary | ICD-10-CM | POA: Diagnosis not present

## 2020-03-13 DIAGNOSIS — A419 Sepsis, unspecified organism: Secondary | ICD-10-CM | POA: Diagnosis not present

## 2020-03-13 DIAGNOSIS — Z515 Encounter for palliative care: Secondary | ICD-10-CM

## 2020-03-13 DIAGNOSIS — Z7189 Other specified counseling: Secondary | ICD-10-CM | POA: Diagnosis not present

## 2020-03-13 DIAGNOSIS — R531 Weakness: Secondary | ICD-10-CM

## 2020-03-13 DIAGNOSIS — J9601 Acute respiratory failure with hypoxia: Secondary | ICD-10-CM | POA: Diagnosis not present

## 2020-03-13 DIAGNOSIS — J181 Lobar pneumonia, unspecified organism: Secondary | ICD-10-CM | POA: Diagnosis not present

## 2020-03-13 LAB — BASIC METABOLIC PANEL
Anion gap: 11 (ref 5–15)
BUN: 11 mg/dL (ref 8–23)
CO2: 25 mmol/L (ref 22–32)
Calcium: 8.2 mg/dL — ABNORMAL LOW (ref 8.9–10.3)
Chloride: 114 mmol/L — ABNORMAL HIGH (ref 98–111)
Creatinine, Ser: 0.65 mg/dL (ref 0.61–1.24)
GFR, Estimated: >60 mL/min (ref >60)
Glucose, Bld: 128 mg/dL — ABNORMAL HIGH (ref 70–99)
Potassium: 2.6 mmol/L — CL (ref 3.5–5.1)
Sodium: 150 mmol/L — ABNORMAL HIGH (ref 135–145)

## 2020-03-13 LAB — MAGNESIUM: Magnesium: 1.7 mg/dL (ref 1.7–2.4)

## 2020-03-13 MED ORDER — SODIUM CHLORIDE 0.9 % IV SOLN: INTRAVENOUS | Status: DC

## 2020-03-13 MED ORDER — MAGNESIUM SULFATE 2 GM/50ML IV SOLN
2.0000 g | Freq: Once | INTRAVENOUS | Status: AC
  Administered 2020-03-13: 2 g via INTRAVENOUS
  Filled 2020-03-13: qty 50

## 2020-03-13 MED ORDER — NYSTATIN 100000 UNIT/ML MT SUSP
5.0000 mL | Freq: Four times a day (QID) | OROMUCOSAL | Status: DC
  Administered 2020-03-13 – 2020-03-18 (×17): 500000 [IU] via ORAL
  Filled 2020-03-13 (×18): qty 5

## 2020-03-13 MED ORDER — POTASSIUM CHLORIDE 10 MEQ/100ML IV SOLN
10.0000 meq | INTRAVENOUS | Status: AC
  Administered 2020-03-13 (×6): 10 meq via INTRAVENOUS
  Filled 2020-03-13 (×6): qty 100

## 2020-03-13 MED ORDER — IPRATROPIUM-ALBUTEROL 20-100 MCG/ACT IN AERS
1.0000 | INHALATION_SPRAY | Freq: Two times a day (BID) | RESPIRATORY_TRACT | Status: DC
  Administered 2020-03-14 – 2020-03-16 (×6): 1 via RESPIRATORY_TRACT

## 2020-03-13 MED ORDER — SODIUM CHLORIDE 0.9 % IV SOLN
1.0000 g | INTRAVENOUS | Status: DC
  Administered 2020-03-13 – 2020-03-15 (×3): 1 g via INTRAVENOUS
  Filled 2020-03-13: qty 10
  Filled 2020-03-13: qty 1
  Filled 2020-03-13: qty 10

## 2020-03-13 NOTE — Progress Notes (Signed)
Daily Progress Note   Patient Name: Devon Peterson       Date: 03/13/2020 DOB: [birth year]  Age: 65 y.o. MRN#: [id number] Attending Physician: Florencia Reasons, MD Primary Care Physician: Patient, No Pcp Per Admit Date: 03/10/2020  Reason for Consultation/Follow-up: Establishing goals of care  Subjective:  appears with generalized weakness, resting in bed, he has some cough with sputum production going on. Denies pain, shortness of breath.   Length of Stay: 3  Current Medications: Scheduled Meds:  . chlorhexidine  15 mL Mouth Rinse BID  . enoxaparin (LOVENOX) injection  40 mg Subcutaneous Q24H  . feeding supplement (OSMOLITE 1.5 CAL)  1,000 mL Per Tube Q24H  . feeding supplement (PROSource TF)  45 mL Per Tube Daily  . free water  200 mL Per Tube Q4H  . Ipratropium-Albuterol  1 puff Inhalation TID  . mouth rinse  15 mL Mouth Rinse q12n4p  . senna  2 tablet Oral Daily  . thiamine  100 mg Oral Daily    Continuous Infusions: . sodium chloride 125 mL/hr at 03/13/20 0223  . ceFEPime (MAXIPIME) IV 2 g (03/13/20 0610)  . metronidazole 500 mg (03/13/20 0230)  . potassium chloride 10 mEq (03/13/20 1022)    PRN Meds: acetaminophen **OR** acetaminophen, nystatin, oxyCODONE  Physical Exam            Vital Signs: BP 105/65 (BP Location: Right Arm)   Pulse 95   Temp 98.2 F (36.8 C) (Oral)   Resp (!) 24   Ht 5\' 11"  (1.803 m)   Wt 49.8 kg   SpO2 96%   BMI 15.31 kg/m  SpO2: SpO2: 96 % O2 Device: O2 Device: Nasal Cannula O2 Flow Rate: O2 Flow Rate (L/min): 2 L/min  Intake/output summary:   Intake/Output Summary (Last 24 hours) at 03/13/2020 1100 Last data filed at 03/13/2020 6837 Gross per 24 hour  Intake 1608.37 ml  Output 1575 ml  Net 33.37 ml   LBM: Last BM Date: 03/11/20 Baseline  Weight: Weight: 50 kg Most recent weight: Weight: 49.8 kg       Palliative Assessment/Data:    Flowsheet Rows   Flowsheet Row Most Recent Value  Intake Tab   Referral Department Hospitalist  Unit at Time of Referral ER  Palliative Care Primary Diagnosis Cancer  Palliative Care Type Return patient  Palliative Care  Reason for referral Clarify Goals of Care  Date first seen by Palliative Care 03/12/20  Clinical Assessment   Palliative Performance Scale Score 40%  Psychosocial & Spiritual Assessment   Palliative Care Outcomes   Patient/Family meeting held? Yes  Who was at the meeting? patient, spoke with sister via telephone  Palliative Care Outcomes Clarified goals of care, Provided end of life care assistance, Provided psychosocial or spiritual support, Linked to palliative care logitudinal support, ACP counseling assistance      Patient Active Problem List   Diagnosis Date Noted  . Hypoxia   . Acute cystitis without hematuria   . Squamous cell carcinoma of lung ([place])   . Weakness   . Malignant neoplasm of lower lobe of left lung ([place]) 02/19/2020  . Acute respiratory insufficiency   . Squamous cell carcinoma of neck   . Goals of care, counseling/discussion   . Tracheostomy care ([place])   . 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 Lung mass   . Neck mass   . Pressure injury of skin 07/08/2019  . Aspiration pneumonia ([place]) 07/07/2019  . Dysphagia 07/07/2019  . Severe protein-calorie malnutrition ([place]) 07/07/2019  . Alcohol use 07/07/2019  . Sepsis ([place]) 04/17/2019  . COPD exacerbation ([place])   . Lactic acidosis   . Tobacco abuse   . Alcohol abuse     Palliative Care Assessment & Plan   Patient Profile:  65 y.o. 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 CTA without PE but reveals left lower lobe pneumonia, LLL bronchus completely obscured ? Debris, and concern for new metastatic lung lesions of RUL and RLL. UA concerning for UTI. Started on antibiotics. Oncology following, well known to Dr. Benay Spice. Poor candidate for chemotherapy due to poor nutritional and functional status. Palliative medicine consultation for goals of care.   Assessment:  sepsis present on admission and acute hypoxic resp failure, UTI and lobar PNA.  Lung cancer, with new right lung massx2 , concerning for metastatic disease oncology following,h/o laryngeal cancer, appear cured by radiation treatment per chart review,status post trach (decannulated in 08/2019),dysphagia from XRT s/p PEG  Recommendations/Plan:  Full Code, Full Scope for now, sister is designated Psychiatrist.  Med onc following, monitor hospital course for further goals of care decision making next week.    Goals of Care and Additional Recommendations: Limitations on Scope of Treatment: Full Scope Treatment  Code Status:    Code Status Orders  (From admission, onward)         Start     Ordered   03/11/20 0126  Full code  Continuous        03/11/20 0125        Code Status History    Date Active Date Inactive Code Status Order ID Comments User Context   07/07/2019 2004 08/28/2019 1932 Full Code [id number]  Shela Leff, MD ED   04/17/2019 1638 04/19/2019 1957 Full Code [id number]  Loletha Grayer, MD ED   Advance Care Planning Activity    Advance Directive Documentation   Flowsheet Row Most Recent Value  Type of Advance Directive Healthcare Power of [place], Living will  [per previous hx]  Pre-existing out of facility DNR order (  yellow form or pink MOST form) --  "MOST" Form in Place? --      Prognosis:  Unable to determine  Discharge Planning: To Be Determined  Care  plan was discussed with  Patient.   Thank you for allowing the Palliative Medicine Team to assist in the care of this patient.   Time In: 11 Time Out: 11.25 Total Time 25 Prolonged Time Billed No       Greater than 50%  of this time was spent counseling and coordinating care related to the above assessment and plan.  Loistine Chance, MD  Please contact Palliative Medicine Team phone at [phone] for questions and concerns.

## 2020-03-13 NOTE — Plan of Care (Signed)
  Problem: Education: Goal: Knowledge of General Education information will improve Description: Including pain rating scale, medication(s)/side effects and non-pharmacologic comfort measures Outcome: Progressing   Problem: Clinical Measurements: Goal: Cardiovascular complication will be avoided Outcome: Progressing   Problem: Coping: Goal: Level of anxiety will decrease Outcome: Progressing   Problem: Elimination: Goal: Will not experience complications related to urinary retention Outcome: Progressing   Problem: Pain Managment: Goal: General experience of comfort will improve Outcome: Progressing   Problem: Safety: Goal: Ability to remain free from injury will improve Outcome: Progressing   Problem: Skin Integrity: Goal: Risk for impaired skin integrity will decrease Outcome: Progressing   

## 2020-03-13 NOTE — Progress Notes (Signed)
PROGRESS NOTE    Devon Peterson  [id number] DOB: Jun 11, 1955 DOA: 03/10/2020 PCP: Patient, No Pcp Per    Chief Complaint  Patient presents with  . 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 fib and aspiration pneumonia, he is sent from nursing home to [facility] due to short of breath, cough, increased sputum production, fever and hypoxia  CT A no PE, + left lower lobe lobar pneumonia, left lower lobe bronchus completely obscured and is likely impacted with debris , CTA also concern new metastatic lung lesions right upper and lower lobes  UA concerning for UTI  Subjective:  Appears stronger, no fever tolerating tube feeds  denies pain  Assessment & Plan:   Active Problems:   Sepsis ([place])   Goals of care, counseling/discussion   Hypoxia   Acute cystitis without hematuria   Squamous cell carcinoma of lung (HCC)   Weakness   Sepsis present on admission with UTI and lobar pneumonia  (aspiration pneumonia due to debris seen in left lower lobe bronchus) -COVID-19 screen negative -MRSA screen negative -blood culture no growth -  sputum culture ordered, pending collection -Speech following, plan for modified barium study on Monday per speech -Change cefepime to Rocephin , continue Flagyl , clinically improving , fever resolved ,leukocytosis and lactate acidosis resolved ,likely able to change to Augmentin soon  Acute hypoxic respiratory failure likely due to lobar pneumonia and sepsis Treat underlying etiology Improving, wean oxygen  History of COPD Currently no wheezing  UTI Urine culture no growth, already on antibiotics   Hypernatremia Sodium 159 on  presentation Likely due to dehydration, continue normal saline,  free water flushes per tube Sodium 150 today  Hypokalemia/hypomagnesemia Replace K and mag  H/o Afib/aflutter Metoprolol held due to sepsis Resume metoprolol with holding parameter CHADsvacs score 0. He is not on aspirin or anticoagulation at home Has been sinus rhythm in the hospital, will keep on telemetry today due to electrolyte abnormality, likely able to DC telemetry soon  Severe malnutrition, underweight Body mass index is 15.31 kg/m.  Nutrition per tube   Lung cancer, with new right lung massx2 , concerning for metastatic disease Seen by Dr. Benay Spice on January 7 , detail please refer to his note .  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 Benay Spice  Palliative care  Procedures:   None  Antimicrobials:   Vanco x1 on admission Flagyl since admission Cefepime from admission to January 9 Rocephin  from January 9     Objective: Vitals:   03/13/20 0156 03/13/20 0503 03/13/20 0905 03/13/20 1307  BP: 103/63 105/65  109/77  Pulse: 88 95  93  Resp: 20 (!) 24  18  Temp: 98.4 F (36.9 C) 98.2 F (36.8 C)  99.2 F (37.3 C)  TempSrc: Oral Oral  Oral  SpO2: 96% 96% 96% 98%  Weight:      Height:        Intake/Output Summary (Last 24 hours) at 03/13/2020 1459 Last data filed at 03/13/2020 1428 Gross per 24 hour   Intake 1608.37 ml  Output 2225 ml  Net -616.63 ml   Filed Weights   03/10/20 1042 03/12/20 1748  Weight: 50 kg 49.8 kg    Examination:  General exam: Malnourished, positive PEG, + condom catheter Respiratory system:  rhonchi has largely resolved, no wheezing , normal respiratory effort Cardiovascular system: S1 & S2 heard, RRR. No pedal edema. Gastrointestinal system: Abdomen is nondistended, soft and nontender.. Normal bowel sounds heard, PEG tube in place Central nervous system: Alert and oriented. No focal neurological deficits. Extremities: Symmetric, generalized weakness Skin: No rashes, lesions or ulcers Psychiatry: Judgement and insight appear normal. Mood & affect appropriate.     Data Reviewed: I have personally reviewed following labs and imaging studies  CBC: Recent Labs  Lab 03/10/20 1007 03/12/20 0348  WBC 12.5* 7.2  NEUTROABS  --  6.0  HGB 11.9* 10.8*  HCT 39.2 36.0*  MCV 77.6* 79.3*  PLT 350 536    Basic Metabolic Panel: Recent Labs  Lab 03/10/20 1007 03/10/20 1753 03/11/20 0508 03/12/20 0348 03/13/20 0558  NA 159* 155* 156* 154* 150*  K 4.3 4.8 4.0 3.6 2.6*  CL 110 112* 116* 118* 114*  CO2 31 32 30 24 25   GLUCOSE 134* 124* 96 138* 128*  BUN 49* 38* 24* 21 11  CREATININE 1.08 0.93 0.78 0.70 0.65  CALCIUM 9.9 8.9 9.3 8.7* 8.2*  MG  --   --   --  1.9 1.7    GFR: Estimated Creatinine Clearance: 65.7 mL/min (by C-G formula based on SCr of 0.65 mg/dL).  Liver Function Tests: Recent Labs  Lab 03/10/20 1055  AST 28  ALT 18  ALKPHOS 108  BILITOT 0.4  PROT 9.7*  ALBUMIN 3.3*    CBG: No results for input(s): GLUCAP in the last 168 hours.   Recent Results (from the past 240 hour(s))  Blood Culture (routine x 2)     Status: None (Preliminary result)   Collection Time: 03/10/20 12:15 PM   Specimen: BLOOD LEFT FOREARM  Result Value Ref Range Status   Specimen Description BLOOD LEFT FOREARM  Final   Special Requests   Final     BOTTLES DRAWN AEROBIC AND ANAEROBIC Blood Culture adequate volume   Culture   Final    NO GROWTH 3 DAYS Performed at [facility], [address] [place], [place] [address]    Report Status PENDING  Incomplete  Blood Culture (routine x 2)     Status: None (Preliminary result)   Collection Time: 03/10/20 12:15 PM   Specimen: BLOOD RIGHT FOREARM  Result Value Ref Range Status   Specimen Description   Final    BLOOD RIGHT FOREARM Performed at [facility], [place] [address], [place], [place] [address]    Special Requests   Final    BOTTLES DRAWN AEROBIC AND ANAEROBIC Blood Culture results may not be optimal due to an inadequate  volume of blood received in culture bottles Performed at [facility] [address], [place], [place] [address]    Culture   Final    NO GROWTH 3 DAYS Performed at [facility], [place] [address], [place], [place] [address]    Report Status PENDING  Incomplete  Resp Panel by RT-PCR (Flu A&B, Covid) Nasopharyngeal Swab     Status: None   Collection Time: 03/10/20  1:00 PM   Specimen: Nasopharyngeal Swab; Nasopharyngeal(NP) swabs in vial transport medium  Result Value Ref Range Status   SARS Coronavirus 2 by RT PCR NEGATIVE NEGATIVE Final    Comment: (NOTE) SARS-CoV-2 target nucleic acids are NOT DETECTED.  The SARS-CoV-2 RNA is generally detectable in upper respiratory specimens during the acute phase of infection. The lowest concentration of SARS-CoV-2 viral copies this assay can detect is 138 copies/mL. A negative result does not preclude SARS-Cov-2 infection and should not be used as the sole basis for treatment or other patient management decisions. A negative result may occur with  improper specimen collection/handling, submission of specimen other than nasopharyngeal swab, presence of viral mutation(s) within the areas targeted by this assay, and inadequate number of viral copies(<138 copies/mL). A negative result  must be combined with clinical observations, patient history, and epidemiological information. The expected result is Negative.  Fact Sheet for Patients:  EntrepreneurPulse.com.au  Fact Sheet for Healthcare Providers:  IncredibleEmployment.be  This test is no t yet approved or cleared by the [place] FDA and  has been authorized for detection and/or diagnosis of SARS-CoV-2 by FDA under an Emergency Use Authorization (EUA). This EUA will remain  in effect (meaning this test can be used) for the duration of the COVID-19 declaration under Section 564(b)(1) of the Act, 21 U.S.C.section 360bbb-3(b)(1), unless the authorization is terminated  or revoked sooner.       Influenza A by PCR NEGATIVE NEGATIVE Final   Influenza B by PCR NEGATIVE NEGATIVE Final    Comment: (NOTE) The Xpert Xpress SARS-CoV-2/FLU/RSV plus assay is intended as an aid in the diagnosis of influenza from Nasopharyngeal swab specimens and should not be used as a sole basis for treatment. Nasal washings and aspirates are unacceptable for Xpert Xpress SARS-CoV-2/FLU/RSV testing.  Fact Sheet for Patients: EntrepreneurPulse.com.au  Fact Sheet for Healthcare Providers: IncredibleEmployment.be  This test is not yet approved or cleared by the [place] FDA and has been authorized for detection and/or diagnosis of SARS-CoV-2 by FDA under an Emergency Use Authorization (EUA). This EUA will remain in effect (meaning this test can be used) for the duration of the COVID-19 declaration under Section 564(b)(1) of the Act, 21 U.S.C. section 360bbb-3(b)(1), unless the authorization is terminated or revoked.  Performed at [facility], [place] [address] [place], [place] [address]   Urine culture     Status: None   Collection Time: 03/10/20  2:00 PM   Specimen: In/Out Cath Urine  Result Value Ref Range Status   Specimen  Description   Final    IN/OUT CATH URINE Performed at [place] [address], [place], [place] [address]    Special Requests   Final    NONE Performed at [facility], [place] [address], [place], [place] [address]    Culture   Final    NO GROWTH Performed at [facility], [place] [address], [place], [place] [address]    Report Status 03/12/2020 FINAL  Final  MRSA PCR Screening  Status: None   Collection Time: 03/10/20  8:38 PM   Specimen: Nasopharyngeal  Result Value Ref Range Status   MRSA by PCR NEGATIVE NEGATIVE Final    Comment:        The GeneXpert MRSA Assay (FDA approved for NASAL specimens only), is one component of a comprehensive MRSA colonization surveillance program. It is not intended to diagnose MRSA infection nor to guide or monitor treatment for MRSA infections. Performed at [facility], [place] [address], [place], [place] [address]          Radiology Studies: No results found.      Scheduled Meds: . chlorhexidine  15 mL Mouth Rinse BID  . enoxaparin (LOVENOX) injection  40 mg Subcutaneous Q24H  . feeding supplement (OSMOLITE 1.5 CAL)  1,000 mL Per Tube Q24H  . feeding supplement (PROSource TF)  45 mL Per Tube Daily  . free water  200 mL Per Tube Q4H  . Ipratropium-Albuterol  1 puff Inhalation TID  . mouth rinse  15 mL Mouth Rinse q12n4p  . senna  2 tablet Oral Daily  . thiamine  100 mg Oral Daily   Continuous Infusions: . sodium chloride 125 mL/hr at 03/13/20 1451  . ceFEPime (MAXIPIME) IV 2 g (03/13/20 1344)  . metronidazole 500 mg (03/13/20 1112)  . potassium chloride 10 mEq (03/13/20 1453)     LOS: 3 days   Time spent: 15mins,  Greater than 50% of this time was spent in counseling, explanation of diagnosis, planning of further management, and coordination of care.  I have personally reviewed and interpreted on  03/13/2020 daily labs, tele strips, imagings as discussed  above under date review session and assessment and plans.  I reviewed all nursing notes, pharmacy notes, consultant notes,  vitals, pertinent old records  I have discussed plan of care as described above with RN , patient on 03/13/2020  Voice Recognition /Dragon dictation system was used to create this note, attempts have been made to correct errors. Please contact the author with questions and/or clarifications.   Florencia Reasons, MD PhD FACP [facility]  Available via Epic secure chat 7am-7pm for nonurgent issues Please page for urgent issues To page the attending provider between 7A-7P or the covering provider during after hours 7P-7A, please log into the web site [email or web address] and access using universal [place] password for that web site. If you do not have the password, please call the hospital operator.    03/13/2020, 2:59 PM

## 2020-03-13 NOTE — Progress Notes (Signed)
Pharmacy Antibiotic Note  Devon Peterson is a 65 y.o. male admitted on 03/10/2020 with PNA and UTI.  Pharmacy has been consulted for cefepime dosing.  Day #4 abx - Afebrile - WBC 7.2 - SCr 0.65, CrCl 65 ml/min - PCT 0.57 (1/7)  Plan:  Continue Cefepime 2 g IV q8 hr  Flagyl per MD  F/u ability to narrow, duration of therapy   Height: 5\' 11"  (180.3 cm) Weight: 49.8 kg (109 lb 12.6 oz) IBW/kg (Calculated) : 75.3  Temp (24hrs), Avg:98.1 F (36.7 C), Min:97.8 F (36.6 C), Max:98.4 F (36.9 C)  Recent Labs  Lab 03/10/20 1007 03/10/20 1058 03/10/20 1352 03/10/20 1753 03/11/20 0508 03/12/20 0348 03/13/20 0558  WBC 12.5*  --   --   --   --  7.2  --   CREATININE 1.08  --   --  0.93 0.78 0.70 0.65  LATICACIDVEN  --  2.5* 2.1*  --   --  1.4  --     Estimated Creatinine Clearance: 65.7 mL/min (by C-G formula based on SCr of 0.65 mg/dL).    Allergies  Allergen Reactions  . Albumin (Human) Itching    May be able to handle at low rates.    Antimicrobials this admission: 1/6 vanc >> 1/7 1/6 cefepime >>  1/6 Flagyl >>  Dose adjustments this admission: n/a  Microbiology results: 1/6 BCx: ngtd 1/6 UCx: ngF 1/6 MRSA PCR: neg  Thank you for allowing pharmacy to be a part of this patient's care.  Peggyann Juba, PharmD, BCPS Pharmacy: [phone] 03/13/2020 10:26 AM

## 2020-03-14 ENCOUNTER — Encounter (HOSPITAL_COMMUNITY): Payer: Self-pay | Admitting: Internal Medicine

## 2020-03-14 ENCOUNTER — Inpatient Hospital Stay (HOSPITAL_COMMUNITY): Payer: Medicaid Other

## 2020-03-14 ENCOUNTER — Ambulatory Visit
Admission: RE | Admit: 2020-03-14 | Discharge: 2020-03-14 | Disposition: A | Discharge status: Home / Self Care | Payer: Medicaid Other | Source: Ambulatory Visit | Attending: Radiation Oncology | Admitting: Radiation Oncology

## 2020-03-14 DIAGNOSIS — J9600 Acute respiratory failure, unspecified whether with hypoxia or hypercapnia: Secondary | ICD-10-CM | POA: Diagnosis not present

## 2020-03-14 DIAGNOSIS — R0902 Hypoxemia: Secondary | ICD-10-CM

## 2020-03-14 DIAGNOSIS — R061 Stridor: Secondary | ICD-10-CM

## 2020-03-14 LAB — BASIC METABOLIC PANEL
Anion gap: 8 (ref 5–15)
BUN: 9 mg/dL (ref 8–23)
CO2: 29 mmol/L (ref 22–32)
Calcium: 8 mg/dL — ABNORMAL LOW (ref 8.9–10.3)
Chloride: 110 mmol/L (ref 98–111)
Creatinine, Ser: 0.68 mg/dL (ref 0.61–1.24)
GFR, Estimated: >60 mL/min (ref >60)
Glucose, Bld: 168 mg/dL — ABNORMAL HIGH (ref 70–99)
Potassium: 2.8 mmol/L — ABNORMAL LOW (ref 3.5–5.1)
Sodium: 147 mmol/L — ABNORMAL HIGH (ref 135–145)

## 2020-03-14 LAB — MAGNESIUM: Magnesium: 1.5 mg/dL — ABNORMAL LOW (ref 1.7–2.4)

## 2020-03-14 LAB — HEMOGLOBIN A1C
Hgb A1c MFr Bld: 6.3 % — ABNORMAL HIGH (ref 4.8–5.6)
Mean Plasma Glucose: 134.11 mg/dL

## 2020-03-14 LAB — GLUCOSE, CAPILLARY: Glucose-Capillary: 177 mg/dL — ABNORMAL HIGH (ref 70–99)

## 2020-03-14 LAB — POTASSIUM: Potassium: 3.3 mmol/L — ABNORMAL LOW (ref 3.5–5.1)

## 2020-03-14 MED ORDER — INSULIN ASPART 100 UNIT/ML ~~LOC~~ SOLN
0.0000 [IU] | SUBCUTANEOUS | Status: DC
  Administered 2020-03-14: 3 [IU] via SUBCUTANEOUS
  Administered 2020-03-15: 2 [IU] via SUBCUTANEOUS
  Administered 2020-03-15: 3 [IU] via SUBCUTANEOUS
  Administered 2020-03-15: 2 [IU] via SUBCUTANEOUS
  Administered 2020-03-15 – 2020-03-17 (×11): 3 [IU] via SUBCUTANEOUS
  Administered 2020-03-18 (×3): 2 [IU] via SUBCUTANEOUS
  Administered 2020-03-18: 3 [IU] via SUBCUTANEOUS
  Administered 2020-03-18: 2 [IU] via SUBCUTANEOUS

## 2020-03-14 MED ORDER — DEXAMETHASONE SODIUM PHOSPHATE 4 MG/ML IJ SOLN
4.0000 mg | Freq: Four times a day (QID) | INTRAMUSCULAR | Status: DC
  Administered 2020-03-14 – 2020-03-16 (×6): 4 mg via INTRAVENOUS
  Filled 2020-03-14 (×5): qty 1

## 2020-03-14 MED ORDER — SODIUM CHLORIDE 0.45 % IV SOLN: INTRAVENOUS | Status: DC

## 2020-03-14 MED ORDER — POTASSIUM CHLORIDE 10 MEQ/100ML IV SOLN
10.0000 meq | INTRAVENOUS | Status: AC
  Administered 2020-03-14 (×6): 10 meq via INTRAVENOUS
  Filled 2020-03-14 (×6): qty 100

## 2020-03-14 MED ORDER — ALBUTEROL SULFATE (2.5 MG/3ML) 0.083% IN NEBU
2.5000 mg | INHALATION_SOLUTION | RESPIRATORY_TRACT | Status: DC | PRN
  Administered 2020-03-14: 2.5 mg via RESPIRATORY_TRACT
  Filled 2020-03-14: qty 3

## 2020-03-14 MED ORDER — MAGNESIUM SULFATE 2 GM/50ML IV SOLN
2.0000 g | Freq: Once | INTRAVENOUS | Status: AC
  Administered 2020-03-14: 2 g via INTRAVENOUS
  Filled 2020-03-14: qty 50

## 2020-03-14 NOTE — Progress Notes (Signed)
CRITICAL VALUE ALERT  Critical Value:  K-2.6 from 3.6  Date & Time Notied:  03/14/2020/ 0617  Provider Notified: Jeannette Corpus  NP  Orders Received/Actions taken:Made aware/Notified/Awaiting orders if any

## 2020-03-14 NOTE — Progress Notes (Signed)
Patient's breathing louder and SOB, respiratory therapist call for evaluation initiated breathing treatment. Dr. Tawanna Solo aware. Will continue to assess patient.

## 2020-03-14 NOTE — Progress Notes (Signed)
Assumed care of pt from off going RN. No changes in initial Am assessment. Con with plan of care

## 2020-03-14 NOTE — Progress Notes (Signed)
Called RT to come and assess patient. Pt with resp stridor, wheezes. Per RT this is patients baseline at this time. No real changes since earlier today. CCM seen pt earlier and ordered decadron. Was given. Pt is a full code. Cont to monitor.

## 2020-03-14 NOTE — Plan of Care (Signed)
  Problem: Nutrition: Goal: Adequate nutrition will be maintained Outcome: Progressing   Problem: Coping: Goal: Level of anxiety will decrease Outcome: Progressing   Problem: Safety: Goal: Ability to remain free from injury will improve Outcome: Progressing   Problem: Education: Goal: Knowledge of General Education information will improve Description: Including pain rating scale, medication(s)/side effects and non-pharmacologic comfort measures Outcome: Progressing

## 2020-03-14 NOTE — Consult Note (Addendum)
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  In early December 2021 approximately 4 weeks prior to admission had CT biopsy of a left lower lobe lung mass that was enlarging.  Positive for squamous cell carcinoma.  He saw his primary oncologist Dr. Malachy Mood few days prior to Christmas 2021.  At that point in time he was taking a partial diet by mouth.  And he only had mild odynophagia.  He has been receiving radiation therapy with Dr. Lowella Dandy last March 10, 2019.  He was living at [facility] he presented on March 11, 2019 with few days of cough and increased sputum production and fevers.  Diagnosed with urinary tract infection because of a dirty UA.  Covid was negative.  He did require 2 L of oxygen.  At the time of admission 03/10/2020: He had CT angiogram without any pulmonary embolism but he had left lower lobe consolidation with left lower lobe bronchus complete "by debris.  CT also concerning for new metastatic lung lesions right upper and lower lobes.  Unclear course of the next 4 days but today on the hospitalist admission 03/14/2020 noted to have stridor.  Chest x-ray 03/14/2020 showed worsening left lower lobe collapse in the last 4 days since admission and pulmonary consulted.  He has been afebrile since the day after arrival since March 11, 2020  He is considered a very poor candidate for chemotherapy given ECOG 4, cachexia  He had swallow eval 03/14/2020: Results are pending  Other past medical history listed below includes aspiration pneumonia.  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 Left lower lobe of the lung mass concerning for primary neoplasm versus metastatic disease -07/07/2019 CT angiogram of the chest 2.7 cm mass in the left lower lobe of the lung -CT 08/17/2019-persistent low-attenuation lesion in the superior segment of left lower lobe with surrounding consolidation/collapse, stable from 07/07/2019, consolidation and groundglass opacity in the left lower lobe have largely resolved -08/20/2019 bronchoscopy-biopsy nondiagnostic -CT chest 11/02/2019-, increased in size concerning for a primary lung malignancy versus a metastasis versus pulmonary abscess, improved left lower lobe aeration, COPD             -PET scan 11/26/2019-3.7 cm left lower lobe pulmonary mass, centrally necrotic and markedly hypermetabolic.  Second tiny 7 mm irregular nodular opacity in the superior segment left lower lobe shows low-level FDG accumulation.  Low-level uptake in an enlarged AP window lymph node.  Mild hypermetabolism in a normal-appearing left axillary lymph node, felt to be reactive.  Bony uptake identified in the pelvis without underlying lesions evident by CT.           -CT biopsy of left lung mass 02/11/2020-squamous cell carcinoma  - visit Dr Adolph Pollack 12/21?21 - [name] returns for a scheduled visit.  He underwent a CT-guided  biopsy of the left lung mass on 02/11/2020 revealed squamous cell carcinoma.  [name] denies dyspnea.  He has mild pain with swallowing.   No other complaint.  He reports taking a partial diet by mouth.  He continues tube feedings.  -Last SBRT 03/09/20  3. Cachexia 4. History of aspiration pneumonia 5. Mild anemia 6. Atrial fibrillation with RVR 6. COPD 8. Alcohol abuse 9. Tobacco dependence 10. Cocaine abuse 11. Odynophagia secondary to radiation-improved   has a past medical history of Cancer ([place]), COPD (chronic obstructive pulmonary disease) ([place]), and Medical history non-contributory.   reports that he quit smoking about 8 months ago. His smoking use included cigarettes. He has a 39.00 pack-year smoking history. He has never used smokeless tobacco.  Past Surgical History:  Procedure Laterality Date   BIOPSY  08/20/2019   Procedure: BIOPSY;  Surgeon: Chesley Mires, MD;  Location: [facility];  Service: Endoscopy;;   BRONCHIAL WASHINGS  08/20/2019   Procedure: BRONCHIAL WASHINGS;  Surgeon: Chesley Mires, MD;  Location: [facility];  Service: Endoscopy;;  LLL BAL   DIRECT LARYNGOSCOPY N/A 07/09/2019   Procedure: DIRECT LARYNGOSCOPY WITH BIOPSY;  Surgeon: Jerrell Belfast, MD;  Location: [facility];  Service: ENT;  Laterality: N/A;   IR FLUORO RM 30-60 MIN  07/13/2019   IR GASTROSTOMY TUBE MOD SED  07/14/2019   IR REPLACE G-TUBE SIMPLE WO FLUORO  10/09/2019   NO PAST SURGERIES     TRACHEOSTOMY TUBE PLACEMENT N/A 07/09/2019   Procedure: TRACHEOSTOMY;  Surgeon: Jerrell Belfast, MD;  Location: [facility];  Service: ENT;  Laterality: N/A;   VIDEO BRONCHOSCOPY Left 08/20/2019   Procedure: VIDEO BRONCHOSCOPY WITH FLUORO;  Surgeon: Chesley Mires, MD;  Location: [facility];  Service: Endoscopy;  Laterality: Left;    Allergies  Allergen Reactions   Albumin (Human) Itching    May be able to handle at low rates.    Immunization History  Administered Date(s) Administered   Influenza-Unspecified 12/11/2019   Moderna Sars-Covid-2 Vaccination 10/09/2019, 11/06/2019   PFIZER SARS-COV-2 Vaccination 03/02/2020    Pneumococcal Polysaccharide-23 04/19/2019    Family History  Problem Relation Age of Onset   CAD Father      Current Facility-Administered Medications:    0.45 % sodium chloride infusion, , Intravenous, Continuous, Adhikari, Amrit, MD, Last Rate: 75 mL/hr at 03/14/20 0959, New Bag at 03/14/20 0959   acetaminophen (TYLENOL) tablet 650 mg, 650 mg, Oral, Q6H PRN **OR** acetaminophen (TYLENOL) suppository 650 mg, 650 mg, Rectal, Q6H PRN, Marylyn Ishihara, Tyrone A, DO   albuterol (PROVENTIL) (2.5 MG/3ML) 0.083% nebulizer solution 2.5 mg, 2.5 mg, Nebulization, Q2H PRN, Tawanna Solo, Amrit, MD, 2.5 mg at 03/14/20 1342   cefTRIAXone (ROCEPHIN) 1 g in sodium chloride 0.9 % 100 mL IVPB, 1 g, Intravenous, Q24H, Florencia Reasons, MD, Last Rate: 200 mL/hr at 03/13/20 2010, 1 g at 03/13/20 2010   chlorhexidine (PERIDEX) 0.12 % solution 15 mL, 15 mL, Mouth Rinse, BID, Florencia Reasons, MD, 15 mL at 03/14/20 1008   enoxaparin (LOVENOX) injection 40 mg, 40 mg, Subcutaneous, Q24H, Kyle, Tyrone A, DO, 40 mg at 03/14/20 1011   feeding supplement (OSMOLITE 1.5 CAL) liquid 1,000 mL, 1,000 mL, Per Tube, Q24H, Florencia Reasons, MD, Last Rate: 50 mL/hr at 03/14/20 1354, 1,000 mL at 03/14/20 1354   feeding supplement (PROSource TF) liquid 45 mL, 45 mL, Per Tube, Daily, Florencia Reasons, MD, 45 mL at 03/14/20 0959   free water 200 mL, 200 mL, Per Tube, Q4H, Florencia Reasons, MD, 200 mL at 03/14/20  1715   Ipratropium-Albuterol (COMBIVENT) respimat 1 puff, 1 puff, Inhalation, BID, Florencia Reasons, MD, 1 puff at 03/14/20 9326   MEDLINE mouth rinse, 15 mL, Mouth Rinse, q12n4p, Florencia Reasons, MD, 15 mL at 03/14/20 1715   metroNIDAZOLE (FLAGYL) IVPB 500 mg, 500 mg, Intravenous, Q8H, Kyle, Tyrone A, DO, Last Rate: 100 mL/hr at 03/14/20 1714, 500 mg at 03/14/20 1714   nystatin (MYCOSTATIN) 100000 UNIT/ML suspension 500,000 Units, 5 mL, Oral, QID PRN, Marylyn Ishihara, Tyrone A, DO   nystatin (MYCOSTATIN) 100000 UNIT/ML suspension 500,000 Units, 5 mL, Oral, QID, Florencia Reasons, MD, 500,000 Units  at 03/14/20 1710   oxyCODONE (Oxy IR/ROXICODONE) immediate release tablet 5 mg, 5 mg, Oral, Q8H PRN, Marylyn Ishihara, Tyrone A, DO, 5 mg at 03/14/20 0751   senna (SENOKOT) tablet 17.2 mg, 2 tablet, Oral, Daily, Kyle, Tyrone A, DO, 17.2 mg at 03/13/20 1017   thiamine tablet 100 mg, 100 mg, Oral, Daily, Kyle, Tyrone A, DO, 100 mg at 03/14/20 [facility] Events:  03/11/2019: Admit 03/13/2019: Palliative care consult full code 03/14/2020: Pulmonary consult and swallow eval  Consults:  x  Procedures:  xx  Significant Diagnostic Tests:  x  Micro Data:  x  Antimicrobials:  x   Interim History / Subjective:  03/15/2019: Seen in 4 E  Objective   Blood pressure 124/76, pulse (!) 105, temperature 98.4 F (36.9 C), temperature source Oral, resp. rate (!) 24, height _0  (1.803 m), weight 49.8 kg, SpO2 98 %.        Intake/Output Summary (Last 24 hours) at 03/14/2020 1718 Last data filed at 03/14/2020 1400 Gross per 24 hour  Intake 4511.3 ml  Output 2750 ml  Net 1761.3 ml   Filed Weights   03/10/20 1042 03/12/20 1748  Weight: 50 kg 49.8 kg    Examination: General: frail cachectic male with stridor HENT: Stridor with mild retraction but no paradoxical movement.  Able to voice but weak Lungs: Clear to auscultation no wheezing Cardiovascular: Normal heart sounds no diaphoresis Abdomen: Soft nontender Extremities: Cachectic but intact Neuro: Alert and oriented x3 GU: Appears intact  Resolved Hospital Problem list   x  Assessment & Plan:   #Status post tracheostomy decannulation May 2021 for Head and neck cancer #Stridor #Worsening left lobe collapse #Acute respiratory distress secondary to the above  -Concern is that he might have aspiration related debris related left lower lobe collapse and the aspirations of could be due to laryngeal issues -such as recurrence of laryngeal cancer or post XRT strictture. -Alternative possibilities worsening cancer causing left  lobe collapse and also some combination of the above  Plan -Get CT chest and CT neck -Recommend ENT consult -might need repeat tracheostomy -Agree with antibiotics -Continue goals of care ideally should be DNR [appears his prognosis is really poor] -Keep in the floor [transferred to the ICU only if worsening respiratory distress and needs intubation]   Discussed with Dr. Christen Bame practice (evaluated daily)  According to the hospitalist*  Goals of Care:  Part of care following       ATTESTATION & SIGNATURE   The patient Needham Biggins is critically ill with multiple organ systems failure and requires high complexity decision making for assessment and support, frequent evaluation and titration of therapies, application of advanced monitoring technologies and extensive interpretation of multiple databases.   Critical Care Time devoted to patient care services described in this note is  45  Minutes. This time reflects time of care of this signee  Dr Brand Males. This critical care time does not reflect procedure time, or teaching time or supervisory time of PA/NP/Med student/Med Resident etc but could involve care discussion time     Dr. Brand Males, M.D., [facility].C.P Pulmonary and Critical Care Medicine Staff Physician [place] [facility] Pager: [phone], If no answer or between  15:00h - 7:00h: call [phone]  03/14/2020 5:49 PM    LABS    PULMONARY No results for input(s): PHART, PCO2ART, PO2ART, HCO3, TCO2, O2SAT in the last 168 hours.  Invalid input(s): PCO2, PO2  CBC Recent Labs  Lab 03/10/20 1007 03/12/20 0348  HGB 11.9* 10.8*  HCT 39.2 36.0*  WBC 12.5* 7.2  PLT 350 270    COAGULATION Recent Labs  Lab 03/10/20 1055 03/11/20 0337  INR 1.1 1.1    CARDIAC  No results for input(s): TROPONINI in the last 168 hours. No results for input(s): PROBNP in the last 168 hours.   CHEMISTRY Recent Labs   Lab 03/10/20 1753 03/11/20 0508 03/12/20 0348 03/13/20 0558 03/14/20 0535 03/14/20 1538  NA 155* 156* 154* 150* 147*  --   K 4.8 4.0 3.6 2.6* 2.8* 3.3*  CL 112* 116* 118* 114* 110  --   CO2 32 _0 --   GLUCOSE 124* 96 138* 128* 168*  --   BUN 38* 24* _1 --   CREATININE 0.93 0.78 0.70 0.65 0.68  --   CALCIUM 8.9 9.3 8.7* 8.2* 8.0*  --   MG  --   --  1.9 1.7 1.5*  --    Estimated Creatinine Clearance: 65.7 mL/min (by C-G formula based on SCr of 0.68 mg/dL).   LIVER Recent Labs  Lab 03/10/20 1055 03/11/20 0337  AST 28  --   ALT 18  --   ALKPHOS 108  --   BILITOT 0.4  --   PROT 9.7*  --   ALBUMIN 3.3*  --   INR 1.1 1.1     INFECTIOUS Recent Labs  Lab 03/10/20 1058 03/10/20 1352 03/11/20 0508 03/12/20 0348  LATICACIDVEN 2.5* 2.1*  --  1.4  PROCALCITON  --   --  0.57  --      ENDOCRINE CBG (last 3)  No results for input(s): GLUCAP in the last 72 hours.       IMAGING x48h  - image(s) personally visualized  -   highlighted in bold DG CHEST PORT 1 VIEW  Result Date: 03/14/2020 CLINICAL DATA:  Shortness of breath. EXAM: PORTABLE CHEST 1 VIEW COMPARISON:  CT chest and chest radiograph 03/10/2020. FINDINGS: Leftward shift of the heart mediastinum. Increasing collapse/consolidation in the left upper and left lower lobes. Large left pleural effusion. Apparent cut off of the left mainstem bronchus. Right lung is clear. Retained oral contrast is seen in the esophagus and stomach. IMPRESSION: Worsening collapse/consolidation in the lingula and left lower lobe with leftward shift of the heart and mediastinum, findings indicative of atelectasis/pneumonia due to worsening mucous plugging. Aspiration is not excluded. Electronically Signed   By: Lorin Picket M.D.   On: 03/14/2020 15:39

## 2020-03-14 NOTE — Progress Notes (Signed)
Modified Barium Swallow Progress Note  Patient Details  Name: Devon Peterson MRN: [id number] Date of Birth: 17-Feb-1956  Today's Date: 03/14/2020  Modified Barium Swallow completed.  Full report located under Chart Review in the Imaging Section.  Brief recommendations include the following:  Clinical Impression  Patient presents with a moderate oral and mod-severe pharyngeal dysphagia. As compared to previous MBS in June of 2020, swallow function has improved. He exhibited moderate vallecular residuals, mild pyriform and posterior pharyngeal wall residuals with puree solids but chin tuck did help clear this. Thin liquids and nectar thick liquids both resulted in diffuse residuals in pharynx, frequent penetration during and after swallow. In addition, esophageal backflow of boluses  were observed with thin/nectar liquids moving back into pharynx and with thin liquids, into laryngeal vestibule. Patient exhibited some belching and this was followed by him regurgitating a significant amount of barium mixed with secretions (this occured twice during study). Although aspiration was not observed, patient exhibited frequent penetration on residuals, which were never fully cleared from pharynx. In addition, esophageal backflow into pharynx and laryngeal vestibule also put patient at high risk of aspiration.   Swallow Evaluation Recommendations       SLP Diet Recommendations: NPO (ice chips PRN after oral care)       Medication Administration: Via alternative means           Postural Changes: Seated upright at 90 degrees   Oral Care Recommendations: Oral care QID      Sonia Baller, MA, CCC-SLP Speech Therapy

## 2020-03-14 NOTE — Progress Notes (Signed)
PT Cancellation Note  Patient Details Name: Devon Peterson MRN: [id number] DOB: 1955-05-31   Cancelled Treatment:    Reason Eval/Treat Not Completed: Patient declined, no reason specified   [facility] 03/14/2020, 3:54 PM

## 2020-03-14 NOTE — Progress Notes (Addendum)
PROGRESS NOTE    Devon Peterson  [id number] DOB: Dec 08, 1955 DOA: 03/10/2020 PCP: Patient, No Pcp Per   Chief Complain: Shortness of breath  Brief Narrative: Patient is a 65 year old male with history of COPD, cocaine abuse, tobacco abuse, laryngeal cancer diagnosed in 2021  status post radiation therapy, status post trach and PEG placement, newly diagnosed left squamous cell lung cancer getting radiation therapy, A. fib, aspiration pneumonia who was sent from skilled nursing facility for the evaluation of shortness of breath, cough, increased sputum production, fever and hypoxia.  CT imaging on presentation did not show any PE but showed left lower lobe pneumonia, debris in the left lower lobe bronchus.  Also showed new metastatic lung lesions on the right upper and lower lobes.  UA was also concerning for UTI.  Hospital course remarkable for electrolyte abnormalities.  Palliative care also consulted for goals of care.  Eventual plan is to discharge back to skilled nursing facility.  Assessment & Plan:   Active Problems:   Sepsis ([place])   Goals of care, counseling/discussion   Hypoxia   Acute cystitis without hematuria   Squamous cell carcinoma of lung (HCC)   Weakness   Sepsis secondary to UTI and lobar pneumonia: Suspected aspiration pneumonia.  Debris seen on the left lower lobe bronchus.  COVID screen negative.  Cultures have not shown any growth.  Speech therapy following.  Plan for modified barium swallow.  Currently on Rocephin and Flagyl.  Currently afebrile.  Lactic acidosis, leukocytosis resolved.  We will change antibiotic to Augmentin when possible.  Acute hypoxic respiratory failure: Secondary to pneumonia.  Continue current  antibiotics.  Continue to wean the oxygen.  She is on 2 L of oxygen per minute .  History of COPD: No wheezing.  Continue bronchodilators as needed  Suspected UTI: Urine culture did not show any growth.  Hypernatremia:Suspected to be from dehydration.   Continue hypotonic fluids.  Check BMP tomorrow  Severe hypokalemia/hypomagnesemia: Continue supplementation and monitoring  Paroxysmal A. fib: Currently rate is controlled.  Not on anticoagulation.  On metoprolol.  Currently in normal sinus rhythm.  Severe protein calorie malnutrition: BMI of 15.31.  On tube feeding diet  Recently diagnosed right squamous cell lung cancer: Follows with Dr. Learta Codding.  CT imaging concerning for metastatic disease.  Will recommend outpatient follow-up with oncology  History of laryngeal cancer/dysphagia: Status post trach but decannulated on 6/21.  Developed dysphagia from radiation therapy.  Status post PEG.  Currently NPO and on tube feeding.  Speech is following.  Plan for MBS  Oral thrush: On nystatin  Normocytic anemia: Most likely secondary to malignancy.  Hemoglobin currently stable  Goals of care: palliative  care has been following during this admission.  Goals of care discussed.  Plan is to continue full code and full scope of treatment for now.  Addendum:CXR showed Worsening collapse/consolidation  left lower lobe due to worsening mucous plugging.I called PCCM consult,discussed with Dr Chase Caller.CXR also showed large pleural effusion,ordered US guidded thoracentesis   Nutrition Problem: Inadequate oral intake Etiology: inability to eat      DVT prophylaxis:Lovnox Code Status: Full Family Communication: None at bedside Status is: Inpatient  Remains inpatient appropriate because:Inpatient level of care appropriate due to severity of illness   Dispo: The patient is from: SNF              Anticipated d/c is to: SNF              Anticipated d/c date is: 2  days              Patient currently is not medically stable to d/c.    Consultants: palliative care  Procedures: None  Antimicrobials:  Anti-infectives (From admission, onward)   Start     Dose/Rate Route Frequency Ordered Stop   03/13/20 2000  cefTRIAXone (ROCEPHIN) 1 g in sodium  chloride 0.9 % 100 mL IVPB        1 g 200 mL/hr over 30 Minutes Intravenous Every 24 hours 03/13/20 1502     03/11/20 0800  ceFEPIme (MAXIPIME) 2 g in sodium chloride 0.9 % 100 mL IVPB  Status:  Discontinued        2 g 200 mL/hr over 30 Minutes Intravenous Every 8 hours 03/11/20 0732 03/13/20 1501   03/11/20 0200  metroNIDAZOLE (FLAGYL) IVPB 500 mg        500 mg 100 mL/hr over 60 Minutes Intravenous Every 8 hours 03/11/20 0125     03/10/20 2200  ceFEPIme (MAXIPIME) 2 g in sodium chloride 0.9 % 100 mL IVPB  Status:  Discontinued        2 g 200 mL/hr over 30 Minutes Intravenous Every 12 hours 03/10/20 1941 03/11/20 0732   03/10/20 2200  vancomycin (VANCOCIN) IVPB 1000 mg/200 mL premix  Status:  Discontinued        1,000 mg 200 mL/hr over 60 Minutes Intravenous Every 24 hours 03/10/20 1941 03/11/20 0801   03/10/20 1115  metroNIDAZOLE (FLAGYL) IVPB 500 mg        500 mg 100 mL/hr over 60 Minutes Intravenous  Once 03/10/20 1105 03/10/20 1456   03/10/20 1100  ceFEPIme (MAXIPIME) 2 g in sodium chloride 0.9 % 100 mL IVPB        2 g 200 mL/hr over 30 Minutes Intravenous  Once 03/10/20 1058 03/10/20 1247   03/10/20 1100  vancomycin (VANCOCIN) IVPB 1000 mg/200 mL premix        1,000 mg 200 mL/hr over 60 Minutes Intravenous  Once 03/10/20 1058 03/10/20 1331   03/10/20 1100  piperacillin-tazobactam (ZOSYN) IVPB 3.375 g  Status:  Discontinued        3.375 g 12.5 mL/hr over 240 Minutes Intravenous  Once 03/10/20 1058 03/10/20 1105      Subjective:  Patient seen and examined the bedside this morning.  Hemodynamically stable.  On 2 L of oxygen per minute.  Had some grunting/labored breathing which could be his baseline.  Denies any severe shortness of breath or cough.  Very deconditioned  Objective: Vitals:   03/13/20 1307 03/13/20 2041 03/14/20 0524 03/14/20 0815  BP: 109/77 134/76 (!) 142/85   Pulse: 93 99 (!) 108   Resp: 18 20 20    Temp: 99.2 F (37.3 C) 99.6 F (37.6 C) 99.3 F (37.4 C)    TempSrc: Oral Oral Oral   SpO2: 98% 94% 95% 96%  Weight:      Height:        Intake/Output Summary (Last 24 hours) at 03/14/2020 1220 Last data filed at 03/14/2020 0526 Gross per 24 hour  Intake 5309.76 ml  Output 2050 ml  Net 3259.76 ml   Filed Weights   03/10/20 1042 03/12/20 1748  Weight: 50 kg 49.8 kg    Examination:  General exam: Extremely deconditioned, debilitated HEENT: Status post tracheostomy with stoma Respiratory system: Bilateral diminished air sounds but no wheezes or crackles  Cardiovascular system: S1 & S2 heard, RRR. No JVD, murmurs, rubs, gallops or clicks. No pedal edema. Gastrointestinal system:  Abdomen is nondistended, soft and nontender. No organomegaly or masses felt. Normal bowel sounds heard.  PEG Central nervous system: Alert and oriented. Extremities: No edema, no clubbing ,no cyanosis Skin: Mild skin breakdowns   Data Reviewed: I have personally reviewed following labs and imaging studies  CBC: Recent Labs  Lab 03/10/20 1007 03/12/20 0348  WBC 12.5* 7.2  NEUTROABS  --  6.0  HGB 11.9* 10.8*  HCT 39.2 36.0*  MCV 77.6* 79.3*  PLT 350 428   Basic Metabolic Panel: Recent Labs  Lab 03/10/20 1753 03/11/20 0508 03/12/20 0348 03/13/20 0558 03/14/20 0535  NA 155* 156* 154* 150* 147*  K 4.8 4.0 3.6 2.6* 2.8*  CL 112* 116* 118* 114* 110  CO2 32 30 24 25 29   GLUCOSE 124* 96 138* 128* 168*  BUN 38* 24* 21 11 9   CREATININE 0.93 0.78 0.70 0.65 0.68  CALCIUM 8.9 9.3 8.7* 8.2* 8.0*  MG  --   --  1.9 1.7 1.5*   GFR: Estimated Creatinine Clearance: 65.7 mL/min (by C-G formula based on SCr of 0.68 mg/dL). Liver Function Tests: Recent Labs  Lab 03/10/20 1055  AST 28  ALT 18  ALKPHOS 108  BILITOT 0.4  PROT 9.7*  ALBUMIN 3.3*   No results for input(s): LIPASE, AMYLASE in the last 168 hours. No results for input(s): AMMONIA in the last 168 hours. Coagulation Profile: Recent Labs  Lab 03/10/20 1055 03/11/20 0337  INR 1.1 1.1    Cardiac Enzymes: No results for input(s): CKTOTAL, CKMB, CKMBINDEX, TROPONINI in the last 168 hours. BNP (last 3 results) No results for input(s): PROBNP in the last 8760 hours. HbA1C: No results for input(s): HGBA1C in the last 72 hours. CBG: No results for input(s): GLUCAP in the last 168 hours. Lipid Profile: No results for input(s): CHOL, HDL, LDLCALC, TRIG, CHOLHDL, LDLDIRECT in the last 72 hours. Thyroid Function Tests: No results for input(s): TSH, T4TOTAL, FREET4, T3FREE, THYROIDAB in the last 72 hours. Anemia Panel: No results for input(s): VITAMINB12, FOLATE, FERRITIN, TIBC, IRON, RETICCTPCT in the last 72 hours. Sepsis Labs: Recent Labs  Lab 03/10/20 1058 03/10/20 1352 03/11/20 0508 03/12/20 0348  PROCALCITON  --   --  0.57  --   LATICACIDVEN 2.5* 2.1*  --  1.4    Recent Results (from the past 240 hour(s))  Blood Culture (routine x 2)     Status: None (Preliminary result)   Collection Time: 03/10/20 12:15 PM   Specimen: BLOOD LEFT FOREARM  Result Value Ref Range Status   Specimen Description BLOOD LEFT FOREARM  Final   Special Requests   Final    BOTTLES DRAWN AEROBIC AND ANAEROBIC Blood Culture adequate volume   Culture   Final    NO GROWTH 4 DAYS Performed at [facility], [place] [address] [place], [place] [address]    Report Status PENDING  Incomplete  Blood Culture (routine x 2)     Status: None (Preliminary result)   Collection Time: 03/10/20 12:15 PM   Specimen: BLOOD RIGHT FOREARM  Result Value Ref Range Status   Specimen Description   Final    BLOOD RIGHT FOREARM Performed at [facility], [place] [address] [place], [place] [address]    Special Requests   Final    BOTTLES DRAWN AEROBIC AND ANAEROBIC Blood Culture results may not be optimal due to an inadequate volume of blood received in culture bottles Performed at [facility] [address], [place], [place] [address]  Culture   Final    NO GROWTH 4  DAYS Performed at [facility], [place] [address], [place], [place] [address]    Report Status PENDING  Incomplete  Resp Panel by RT-PCR (Flu A&B, Covid) Nasopharyngeal Swab     Status: None   Collection Time: 03/10/20  1:00 PM   Specimen: Nasopharyngeal Swab; Nasopharyngeal(NP) swabs in vial transport medium  Result Value Ref Range Status   SARS Coronavirus 2 by RT PCR NEGATIVE NEGATIVE Final    Comment: (NOTE) SARS-CoV-2 target nucleic acids are NOT DETECTED.  The SARS-CoV-2 RNA is generally detectable in upper respiratory specimens during the acute phase of infection. The lowest concentration of SARS-CoV-2 viral copies this assay can detect is 138 copies/mL. A negative result does not preclude SARS-Cov-2 infection and should not be used as the sole basis for treatment or other patient management decisions. A negative result may occur with  improper specimen collection/handling, submission of specimen other than nasopharyngeal swab, presence of viral mutation(s) within the areas targeted by this assay, and inadequate number of viral copies(<138 copies/mL). A negative result must be combined with clinical observations, patient history, and epidemiological information. The expected result is Negative.  Fact Sheet for Patients:  EntrepreneurPulse.com.au  Fact Sheet for Healthcare Providers:  IncredibleEmployment.be  This test is no t yet approved or cleared by the [place] FDA and  has been authorized for detection and/or diagnosis of SARS-CoV-2 by FDA under an Emergency Use Authorization (EUA). This EUA will remain  in effect (meaning this test can be used) for the duration of the COVID-19 declaration under Section 564(b)(1) of the Act, 21 U.S.C.section 360bbb-3(b)(1), unless the authorization is terminated  or revoked sooner.       Influenza A by PCR NEGATIVE NEGATIVE Final   Influenza B by PCR NEGATIVE NEGATIVE Final    Comment:  (NOTE) The Xpert Xpress SARS-CoV-2/FLU/RSV plus assay is intended as an aid in the diagnosis of influenza from Nasopharyngeal swab specimens and should not be used as a sole basis for treatment. Nasal washings and aspirates are unacceptable for Xpert Xpress SARS-CoV-2/FLU/RSV testing.  Fact Sheet for Patients: EntrepreneurPulse.com.au  Fact Sheet for Healthcare Providers: IncredibleEmployment.be  This test is not yet approved or cleared by the [place] FDA and has been authorized for detection and/or diagnosis of SARS-CoV-2 by FDA under an Emergency Use Authorization (EUA). This EUA will remain in effect (meaning this test can be used) for the duration of the COVID-19 declaration under Section 564(b)(1) of the Act, 21 U.S.C. section 360bbb-3(b)(1), unless the authorization is terminated or revoked.  Performed at [facility], [place] [address], [place], [place] [address]   Urine culture     Status: None   Collection Time: 03/10/20  2:00 PM   Specimen: In/Out Cath Urine  Result Value Ref Range Status   Specimen Description   Final    IN/OUT CATH URINE Performed at [facility] [address], [place], [place] [address]    Special Requests   Final    NONE Performed at [facility], [place] [address], [place], [place] [address]    Culture   Final    NO GROWTH Performed at [facility], [place] [address], [place],  [address]    Report Status 03/12/2020 FINAL  Final  MRSA PCR Screening     Status: None   Collection Time: 03/10/20  8:38 PM   Specimen: Nasopharyngeal  Result Value Ref Range  Status   MRSA by PCR NEGATIVE NEGATIVE Final    Comment:        The GeneXpert MRSA Assay (FDA approved for NASAL specimens only), is one component of a comprehensive MRSA colonization surveillance program. It is not intended to diagnose MRSA infection nor to guide or monitor  treatment for MRSA infections. Performed at [facility], [place] [address], [place], [place] [address]          Radiology Studies: No results found.      Scheduled Meds: . chlorhexidine  15 mL Mouth Rinse BID  . enoxaparin (LOVENOX) injection  40 mg Subcutaneous Q24H  . feeding supplement (OSMOLITE 1.5 CAL)  1,000 mL Per Tube Q24H  . feeding supplement (PROSource TF)  45 mL Per Tube Daily  . free water  200 mL Per Tube Q4H  . Ipratropium-Albuterol  1 puff Inhalation BID  . mouth rinse  15 mL Mouth Rinse q12n4p  . nystatin  5 mL Oral QID  . senna  2 tablet Oral Daily  . thiamine  100 mg Oral Daily   Continuous Infusions: . sodium chloride 75 mL/hr at 03/14/20 0959  . cefTRIAXone (ROCEPHIN)  IV 1 g (03/13/20 2010)  . metronidazole 500 mg (03/14/20 0206)  . potassium chloride 10 mEq (03/14/20 1105)     LOS: 4 days    Time spent: 35 mins,More than 50% of that time was spent in counseling and/or coordination of care.      Shelly Coss, MD [facility] P1/12/2020, 12:20 PM

## 2020-03-14 NOTE — Progress Notes (Signed)
Reason for Consult: Stridor Referring Physician: Shelly Coss, MD  Devon Peterson is an 65 y.o. male.  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  Treated with radiation for that as well.  Admitted to the hospital a few days ago with pneumonia and treated for that.  Found to have inspiratory stridor while in the hospital.  According to the patient, his breathing is better now than it has been past few days.  Past Medical History:  Diagnosis Date  . Cancer ([place])   . COPD (chronic obstructive pulmonary disease) ([place])   . Medical history non-contributory     Past Surgical History:  Procedure Laterality Date  . BIOPSY  08/20/2019   Procedure: BIOPSY;  Surgeon: Chesley Mires, MD;  [facility];  Service: Endoscopy;;  . BRONCHIAL WASHINGS  08/20/2019   Procedure: BRONCHIAL WASHINGS;  Surgeon: Chesley Mires, MD;  [facility];  Service: Endoscopy;;  LLL BAL  . DIRECT LARYNGOSCOPY N/A 07/09/2019   Procedure: DIRECT LARYNGOSCOPY WITH BIOPSY;  Surgeon: Jerrell Belfast, MD;  [facility];  Service: ENT;  Laterality: N/A;  . IR FLUORO RM 30-60 MIN  07/13/2019  . IR GASTROSTOMY TUBE MOD SED  07/14/2019  . IR REPLACE G-TUBE SIMPLE WO FLUORO  10/09/2019  . NO PAST SURGERIES    . TRACHEOSTOMY TUBE PLACEMENT N/A 07/09/2019   Procedure: TRACHEOSTOMY;  Surgeon: Jerrell Belfast, MD;  [facility];  Service: ENT;  Laterality: N/A;  . VIDEO BRONCHOSCOPY Left 08/20/2019   Procedure: VIDEO BRONCHOSCOPY WITH FLUORO;  Surgeon: Chesley Mires, MD;  [facility];  Service: Endoscopy;  Laterality: Left;    Family History  Problem Relation Age of Onset  . CAD Father     Social History:  reports that he quit smoking about 8 months ago. His smoking use included cigarettes. He has a 39.00 pack-year smoking history. He has never used smokeless tobacco. He reports current alcohol use of about 24.0  standard drinks of alcohol per week. He reports previous drug use.  Allergies:  Allergies  Allergen Reactions  . Albumin (Human) Itching    May be able to handle at low rates.    Medications: Reviewed  Results for orders placed or performed during the hospital encounter of 03/10/20 (from the past 48 hour(s))  Basic metabolic panel     Status: Abnormal   Collection Time: 03/13/20  5:58 AM  Result Value Ref Range   Sodium 150 (H) 135 - 145 mmol/L   Potassium 2.6 (LL) 3.5 - 5.1 mmol/L    Comment: DELTA CHECK NOTED CRITICAL RESULT CALLED TO, READ BACK BY AND VERIFIED WITH: GIRLY,K RN _0  ON 03/13/20 JACKSON,K    Chloride 114 (H) 98 - 111 mmol/L   CO2 25 22 - 32 mmol/L   Glucose, Bld 128 (H) 70 - 99 mg/dL    Comment: Glucose reference range applies only to samples taken after fasting for at least 8 hours.   BUN 11 8 - 23 mg/dL   Creatinine, Ser 0.65 0.61 - 1.24 mg/dL   Calcium 8.2 (L) 8.9 - 10.3 mg/dL   GFR, Estimated >60 >60 mL/min    Comment: (NOTE) Calculated using the CKD-EPI Creatinine Equation (2021)    Anion gap 11 5 - 15    Comment: Performed at [facility], [place] [address], [place], [place] [address]  Magnesium     Status: None   Collection Time:  03/13/20  5:58 AM  Result Value Ref Range   Magnesium 1.7 1.7 - 2.4 mg/dL    Comment: Performed at [facility], [place] [address] [place], [place] [address]  Basic metabolic panel     Status: Abnormal   Collection Time: 03/14/20  5:35 AM  Result Value Ref Range   Sodium 147 (H) 135 - 145 mmol/L   Potassium 2.8 (L) 3.5 - 5.1 mmol/L   Chloride 110 98 - 111 mmol/L   CO2 29 22 - 32 mmol/L   Glucose, Bld 168 (H) 70 - 99 mg/dL    Comment: Glucose reference range applies only to samples taken after fasting for at least 8 hours.   BUN 9 8 - 23 mg/dL   Creatinine, Ser 0.68 0.61 - 1.24 mg/dL   Calcium 8.0 (L) 8.9 - 10.3 mg/dL   GFR, Estimated >60 >60 mL/min    Comment: (NOTE) Calculated  using the CKD-EPI Creatinine Equation (2021)    Anion gap 8 5 - 15    Comment: Performed at [facility], [place] [address], [place], [place] [address]  Magnesium     Status: Abnormal   Collection Time: 03/14/20  5:35 AM  Result Value Ref Range   Magnesium 1.5 (L) 1.7 - 2.4 mg/dL    Comment: Performed at [facility], [place] [address] [place], [place] [address]  Potassium     Status: Abnormal   Collection Time: 03/14/20  3:38 PM  Result Value Ref Range   Potassium 3.3 (L) 3.5 - 5.1 mmol/L    Comment: Performed at [facility], [place] [address], [place],  [address]    DG CHEST PORT 1 VIEW  Result Date: 03/14/2020 CLINICAL DATA:  Shortness of breath. EXAM: PORTABLE CHEST 1 VIEW COMPARISON:  CT chest and chest radiograph 03/10/2020. FINDINGS: Leftward shift of the heart mediastinum. Increasing collapse/consolidation in the left upper and left lower lobes. Large left pleural effusion. Apparent cut off of the left mainstem bronchus. Right lung is clear. Retained oral contrast is seen in the esophagus and stomach. IMPRESSION: Worsening collapse/consolidation in the lingula and left lower lobe with leftward shift of the heart and mediastinum, findings indicative of atelectasis/pneumonia due to worsening mucous plugging. Aspiration is not excluded. Electronically Signed   By: Lorin Picket M.D.   On: 03/14/2020 15:39   DG Swallowing Func-Speech Pathology  Result Date: 03/14/2020 Objective Swallowing Evaluation: Type of Study: MBS-Modified Barium Swallow Study  Patient Details Name: Devon Peterson MRN: [id number] Date of Birth: [birth year] Today's Date: 03/14/2020 Time: SLP Start Time (ACUTE ONLY): 1450 -SLP Stop Time (ACUTE ONLY): 1505 SLP Time Calculation (min) (ACUTE ONLY): 15 min Past Medical History: Past Medical History: Diagnosis Date . Cancer ([place])  . COPD (chronic obstructive pulmonary disease) ([place])  . Medical history non-contributory  Past  Surgical History: Past Surgical History: Procedure Laterality Date . BIOPSY  08/20/2019  Procedure: BIOPSY;  Surgeon: Chesley Mires, MD;  [facility];  Service: Endoscopy;; . BRONCHIAL WASHINGS  08/20/2019  Procedure: BRONCHIAL WASHINGS;  Surgeon: Chesley Mires, MD;  [facility];  Service: Endoscopy;;  LLL BAL . DIRECT LARYNGOSCOPY N/A 07/09/2019  Procedure: DIRECT LARYNGOSCOPY WITH BIOPSY;  Surgeon: Jerrell Belfast, MD;  [facility];  Service: ENT;  Laterality: N/A; . IR FLUORO RM 30-60 MIN  07/13/2019 . IR GASTROSTOMY TUBE MOD SED  07/14/2019 . IR REPLACE G-TUBE SIMPLE WO FLUORO  10/09/2019 . NO PAST SURGERIES   . TRACHEOSTOMY TUBE PLACEMENT  N/A 07/09/2019  Procedure: TRACHEOSTOMY;  Surgeon: Jerrell Belfast, MD;  [facility];  Service: ENT;  Laterality: N/A; . VIDEO BRONCHOSCOPY Left 08/20/2019  Procedure: VIDEO BRONCHOSCOPY WITH FLUORO;  Surgeon: Chesley Mires, MD;  [facility];  Service: Endoscopy;  Laterality: Left; HPI: Patient is a 65 y.o. male with PMH: metastatic lung cancer (just started round of XRT), COPD [facility] at [facility], who was found to be dyspneic and hypoxic and placed on 2L [place] and improved. He has had a couple days history of cough, increased sputnum producdtion and fevers so he was brought to ED.  CXR revealed Worsening collapse/consolidation in the lingula and left lower lobewith leftward shift of the heart and mediastinum, findings indicative of atelectasis/pneumonia due to worsening mucous plugging. Aspiration is not excluded, COVID negative. Patient was seen by speech therapy during previous admission in June of 2021 for dysphagia.  Subjective: pleasant, cooperative Assessment / Plan / Recommendation CHL IP CLINICAL IMPRESSIONS 03/14/2020 Clinical Impression Patient presents with a moderate oral and mod-severe pharyngeal dysphagia. As compared to previous MBS in June of 2020, swallow function has improved. He exhibited moderate vallecular residuals,  mild pyriform and posterior pharyngeal wall residuals with puree solids but chin tuck did help clear this. Thin liquids and nectar thick liquids both resulted in diffuse residuals in pharynx, frequent penetration during and after swallow. In addition, esophageal backflow of boluses  were observed with thin/nectar liquids moving back into pharynx and with thin liquids, into laryngeal vestibule. Patient exhibited some belching and this was followed by him regurgitating a significant amount of barium mixed with secretions (this occured twice during study). Although aspiration was not observed, patient exhibited frequent penetration on residuals, which were never fully cleared from pharynx. In addition, esophageal backflow into pharynx and laryngeal vestibule also put patient at high risk of aspiration. SLP Visit Diagnosis -- Attention and concentration deficit following -- Frontal lobe and executive function deficit following -- Impact on safety and function --   CHL IP TREATMENT RECOMMENDATION 03/14/2020 Treatment Recommendations Therapy as outlined in treatment plan below   Prognosis 03/14/2020 Prognosis for Safe Diet Advancement [place] Barriers to Reach Goals Time post onset;Severity of deficits Barriers/Prognosis Comment -- CHL IP DIET RECOMMENDATION 03/14/2020 SLP Diet Recommendations NPO Liquid Administration via -- Medication Administration Via alternative means Compensations -- Postural Changes Seated upright at 90 degrees   CHL IP OTHER RECOMMENDATIONS 03/14/2020 Recommended Consults -- Oral Care Recommendations Oral care QID Other Recommendations --   CHL IP FOLLOW UP RECOMMENDATIONS 03/14/2020 Follow up Recommendations Skilled Nursing facility;24 hour supervision/assistance   CHL IP FREQUENCY AND DURATION 03/14/2020 Speech Therapy Frequency (ACUTE ONLY) min 2x/week Treatment Duration 1 week      CHL IP ORAL PHASE 03/14/2020 Oral Phase Impaired Oral - Pudding Teaspoon -- Oral - Pudding Cup -- Oral - Honey Teaspoon --  Oral - Honey Cup -- Oral - Nectar Teaspoon -- Oral - Nectar Cup -- Oral - Nectar Straw -- Oral - Thin Teaspoon Reduced posterior propulsion;Delayed oral transit;Weak lingual manipulation Oral - Thin Cup Lingual pumping;Delayed oral transit;Reduced posterior propulsion;Weak lingual manipulation Oral - Thin Straw -- Oral - Puree Delayed oral transit;Piecemeal swallowing;Reduced posterior propulsion;Weak lingual manipulation;Lingual pumping Oral - Mech Soft -- Oral - Regular -- Oral - Multi-Consistency -- Oral - Pill -- Oral Phase - Comment --  CHL IP PHARYNGEAL PHASE 03/14/2020 Pharyngeal Phase Impaired Pharyngeal- Pudding Teaspoon -- Pharyngeal -- Pharyngeal- Pudding Cup -- Pharyngeal -- Pharyngeal- Honey Teaspoon -- Pharyngeal -- Pharyngeal- Honey Cup --  Pharyngeal -- Pharyngeal- Nectar Teaspoon -- Pharyngeal -- Pharyngeal- Nectar Cup Delayed swallow initiation-vallecula;Reduced pharyngeal peristalsis;Reduced laryngeal elevation;Pharyngeal residue - valleculae;Pharyngeal residue - pyriform;Pharyngeal residue - posterior pharnyx;Penetration/Aspiration during swallow;Penetration/Apiration after swallow Pharyngeal Material enters airway, remains ABOVE vocal cords and not ejected out Pharyngeal- Nectar Straw -- Pharyngeal -- Pharyngeal- Thin Teaspoon Reduced airway/laryngeal closure;Delayed swallow initiation-vallecula;Reduced laryngeal elevation;Penetration/Apiration after swallow;Penetration/Aspiration during swallow;Pharyngeal residue - valleculae;Pharyngeal residue - pyriform;Pharyngeal residue - posterior pharnyx Pharyngeal Material enters airway, remains ABOVE vocal cords and not ejected out Pharyngeal- Thin Cup Delayed swallow initiation-vallecula;Reduced airway/laryngeal closure;Reduced laryngeal elevation;Penetration/Aspiration during swallow;Penetration/Apiration after swallow;Pharyngeal residue - valleculae;Pharyngeal residue - pyriform;Pharyngeal residue - posterior pharnyx Pharyngeal Material enters airway,  remains ABOVE vocal cords and not ejected out Pharyngeal- Thin Straw -- Pharyngeal -- Pharyngeal- Puree Delayed swallow initiation-vallecula;Pharyngeal residue - valleculae;Pharyngeal residue - pyriform;Pharyngeal residue - posterior pharnyx Pharyngeal -- Pharyngeal- Mechanical Soft -- Pharyngeal -- Pharyngeal- Regular -- Pharyngeal -- Pharyngeal- Multi-consistency -- Pharyngeal -- Pharyngeal- Pill -- Pharyngeal -- Pharyngeal Comment --  CHL IP CERVICAL ESOPHAGEAL PHASE 03/14/2020 Cervical Esophageal Phase Impaired Pudding Teaspoon -- Pudding Cup -- Honey Teaspoon -- Honey Cup -- Nectar Teaspoon -- Nectar Cup Esophageal backflow into the pharynx;Reduced cricopharyngeal relaxation Nectar Straw -- Thin Teaspoon -- Thin Cup Esophageal backflow into the pharynx;Reduced cricopharyngeal relaxation;Esophageal backflow into the larynx Thin Straw -- Puree Reduced cricopharyngeal relaxation Mechanical Soft -- Regular -- Multi-consistency -- Pill -- Cervical Esophageal Comment -- Sonia Baller, MA, CCC-SLP Speech Therapy              PPI:[id number] except as listed in admit H&P  Blood pressure 123/76, pulse (!) 107, temperature 99.4 F (37.4 C), temperature source Oral, resp. rate (!) 25, height _0  (1.803 m), weight 49.8 kg, SpO2 96 %.  PHYSICAL EXAM: Overall appearance: Cachectic gentleman, in no distress, with inspiratory stridor. Head:  Normocephalic, atraumatic. Larynx/Hypopharynx: See below Neuro:  No identifiable neurologic deficits. Neck: No palpable neck masses.  Studies Reviewed: none  Procedures: Flexible fiberoptic laryngoscopy was performed following topical application of Xylocaine/Afrin to the nasal cavities.  The right nasal cavity is clear.  The nasopharynx and oropharynx are clear.  The hypopharynx and larynx are grossly abnormal.  The cords are limited visibility.  There is either necrosis or possibly a mass arising from the right side posterior larynx.  I cannot say if the cords are  mobile as they are barely visible.  The piriform sinuses are not visible either.  There is significant edema of the supraglottic larynx.   Assessment/Plan: Continued stridor.  Possible persistent hypopharyngeal cancer, very difficult to tell with fiberoptic examination.  Last CT neck was August and there is no evidence of persistent or recurrent disease.  Recommend proceed with new CT of the neck to see if there is any evidence of residual cancer.  Ultimately he may require tracheostomy for airway security.  Izora Gala 03/14/2020, 7:43 PM

## 2020-03-14 NOTE — TOC Initial Note (Signed)
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 Benay Spice.Await PT recc prior sending fl2.                Expected Discharge Plan: [place] Barriers to Discharge: Continued Medical Work up   Patient Goals and CMS Choice Patient states their goals for this hospitalization and ongoing recovery are:: return back to [employer] Medicare.gov Compare Post Acute Care list provided to:: Patient Represenative (must comment) Choice offered to / list presented to : [place] / Guardian,Sibling  Expected Discharge Plan and Services Expected Discharge Plan: [place]   Discharge Planning Services: CM Consult   Living arrangements for the past 2 months: [place]                                      Prior Living Arrangements/Services Living arrangements for the past 2 months: [place] Lives with:: Facility Resident Patient language and need for interpreter reviewed:: Yes Do you feel safe going back to the place where you live?: Yes      Need for Family Participation in Patient Care: No (Comment) Care giver support system in place?: Yes (comment) Current home services: DME (rw) Criminal Activity/Legal Involvement Pertinent to Current Situation/Hospitalization: No - Comment as needed  Activities of Daily Living Home Assistive Devices/Equipment: Enteral Feeding Supplies ADL Screening (condition at time of admission) Patient's cognitive ability adequate to safely complete daily activities?: Yes Is the patient deaf or have difficulty hearing?: No Does the patient  have difficulty seeing, even when wearing glasses/contacts?: No Does the patient have difficulty concentrating, remembering, or making decisions?: No Patient able to express need for assistance with ADLs?: Yes Does the patient have difficulty dressing or bathing?: Yes Independently performs ADLs?: No Communication: Independent Dressing (OT): Needs assistance Is this a change from baseline?: Pre-admission baseline Grooming: Needs assistance Is this a change from baseline?: Pre-admission baseline Feeding: Needs assistance Is this a change from baseline?: Pre-admission baseline Bathing: Needs assistance Is this a change from baseline?: Pre-admission baseline Toileting: Needs assistance Is this a change from baseline?: Pre-admission baseline In/Out Bed: Needs assistance Is this a change from baseline?: Pre-admission baseline Walks in Home: Needs assistance Is this a change from baseline?: Pre-admission baseline Does the patient have difficulty walking or climbing stairs?: Yes Weakness of Legs: Both Weakness of Arms/Hands: Both  Permission Sought/Granted Permission sought to share information with : Case Manager Permission granted to share information with : Yes, Verbal Permission Granted  Share Information with NAME: Case manager     Permission granted to share info w Relationship: [name] sister [phone]     Emotional Assessment Appearance:: Appears stated age Attitude/Demeanor/Rapport: Gracious Affect (typically observed): Accepting Orientation: : Oriented to Self,Oriented to  Time,Oriented to Place,Oriented to Situation Alcohol / Substance Use: Not Applicable Psych Involvement: No (comment)  Admission diagnosis:  Cough [R05.9] Hypoxia [R09.02] Acute cystitis without hematuria [N30.00] Sepsis ([place]) [A41.9] Sepsis, due to unspecified organism, unspecified whether acute organ dysfunction present [facility]) [A41.9] Patient Active Problem List   Diagnosis Date Noted  . Hypoxia    .  Acute cystitis without hematuria   . 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 Weakness   . Malignant neoplasm of lower lobe of left lung ([place]) 02/19/2020  . Acute respiratory insufficiency   . Squamous cell carcinoma of neck   . Goals of care, counseling/discussion   . Tracheostomy care ([place])   . Cancer of hypopharynx ([place]) 07/11/2019  . Lung mass   . Neck mass   . Pressure injury of skin 07/08/2019  . Aspiration pneumonia ([place]) 07/07/2019  . Dysphagia 07/07/2019  . Severe protein-calorie malnutrition ([place]) 07/07/2019  . Alcohol use 07/07/2019  . Sepsis ([place]) 04/17/2019  . COPD exacerbation ([place])   . Lactic acidosis   . Tobacco abuse   . Alcohol abuse    PCP:  Patient, No Pcp Per Pharmacy:   [id number] [place], [place] [address] Phone: [phone] Fax: [phone]     Social Determinants of Health (SDOH) Interventions    Readmission Risk Interventions Readmission Risk Prevention Plan 07/27/2019  Transportation Screening Complete  HRI or Home Care Consult Complete  Social Work Consult for [place] Planning/Counseling Complete  Palliative Care Screening Complete  Medication Review Press photographer) Complete  Some recent data might be hidden

## 2020-03-15 ENCOUNTER — Ambulatory Visit
Admission: RE | Admit: 2020-03-15 | Discharge: 2020-03-15 | Disposition: A | Discharge status: Home / Self Care | Payer: Medicaid Other | Source: Ambulatory Visit | Attending: Radiation Oncology | Admitting: Radiation Oncology

## 2020-03-15 ENCOUNTER — Other Ambulatory Visit: Payer: Self-pay | Admitting: Otolaryngology

## 2020-03-15 ENCOUNTER — Inpatient Hospital Stay (HOSPITAL_COMMUNITY): Payer: Medicaid Other

## 2020-03-15 DIAGNOSIS — R061 Stridor: Secondary | ICD-10-CM | POA: Diagnosis not present

## 2020-03-15 LAB — MAGNESIUM: Magnesium: 1.7 mg/dL (ref 1.7–2.4)

## 2020-03-15 LAB — BASIC METABOLIC PANEL
Anion gap: 10 (ref 5–15)
BUN: 11 mg/dL (ref 8–23)
CO2: 28 mmol/L (ref 22–32)
Calcium: 8.5 mg/dL — ABNORMAL LOW (ref 8.9–10.3)
Chloride: 107 mmol/L (ref 98–111)
Creatinine, Ser: 0.61 mg/dL (ref 0.61–1.24)
GFR, Estimated: >60 mL/min (ref >60)
Glucose, Bld: 178 mg/dL — ABNORMAL HIGH (ref 70–99)
Potassium: 3.6 mmol/L (ref 3.5–5.1)
Sodium: 145 mmol/L (ref 135–145)

## 2020-03-15 LAB — GLUCOSE, CAPILLARY
Glucose-Capillary: 133 mg/dL — ABNORMAL HIGH (ref 70–99)
Glucose-Capillary: 149 mg/dL — ABNORMAL HIGH (ref 70–99)
Glucose-Capillary: 152 mg/dL — ABNORMAL HIGH (ref 70–99)
Glucose-Capillary: 156 mg/dL — ABNORMAL HIGH (ref 70–99)
Glucose-Capillary: 163 mg/dL — ABNORMAL HIGH (ref 70–99)
Glucose-Capillary: 198 mg/dL — ABNORMAL HIGH (ref 70–99)

## 2020-03-15 LAB — CULTURE, BLOOD (ROUTINE X 2)
Culture: NO GROWTH
Culture: NO GROWTH
Special Requests: ADEQUATE

## 2020-03-15 MED ORDER — LIDOCAINE HCL 1 % IJ SOLN
INTRAMUSCULAR | Status: AC
  Filled 2020-03-15: qty 20

## 2020-03-15 NOTE — Progress Notes (Addendum)
Attempting to give pt morning medications and PEG tube is clogged. Two nurses have attempted to flush tube with no results. Md notified and requested IR assist with unclogging.Eulas Post, RN

## 2020-03-15 NOTE — Progress Notes (Signed)
Daily Progress Note   Patient Name: Devon Peterson       Date: 03/15/2020 DOB: Dec 03, 1955  Age: 65 y.o. MRN#: [id number] Attending Physician: Shelly Coss, MD Primary Care Physician: Patient, No Pcp Per Admit Date: 03/10/2020  Reason for Consultation/Follow-up: Establishing goals of care  Subjective: I saw and examined Devon Peterson today.  He reports remembering me talking with him last May about goals of care as well.  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  Discussed that he subsequently had biopsy for lung lesions as well with findings of squamous cell carcinoma.  Discussed being under the care of Dr. Isidore Moos and currently is undergoing radiation treatment.  We also talked about evaluation by ENT and consideration that he may require tracheostomy if he continues to desire aggressive care.  Told me that he, "hopes it does not come to that."  I further discussed this with him and he expressed that his hope is that he does not need a tracheostomy, however, he does want it if it is the next medically recommended step and continuation of aggressive interventions.  We have talked several times with Devon Peterson over the past year about aggressiveness of care and he continues to endorse wanting full code/full scope of treatment.  I also reviewed with him surrogate decision making.  He expressed again that his sister, Devon Peterson, will be his surrogate.  There is paperwork on his chart that designates her as his surrogate decision-maker.  length of Stay: 5  Current Medications: Scheduled Meds:  . chlorhexidine  15 mL Mouth Rinse BID  . dexamethasone (DECADRON) injection  4 mg Intravenous Q6H  . enoxaparin (LOVENOX) injection  40 mg Subcutaneous Q24H  . feeding  supplement (OSMOLITE 1.5 CAL)  1,000 mL Per Tube Q24H  . feeding supplement (PROSource TF)  45 mL Per Tube Daily  . free water  200 mL Per Tube Q4H  . insulin aspart  0-15 Units Subcutaneous Q4H  . Ipratropium-Albuterol  1 puff Inhalation BID  . mouth rinse  15 mL Mouth Rinse q12n4p  . nystatin  5 mL Oral QID  . senna  2 tablet Oral Daily  . thiamine  100 mg Oral Daily    Continuous Infusions: . sodium chloride 75 mL/hr at 03/15/20 0257  . cefTRIAXone (ROCEPHIN)  IV  1 g (03/14/20 2119)  . metronidazole 500 mg (03/15/20 1323)    PRN Meds: acetaminophen **OR** acetaminophen, albuterol, nystatin, oxyCODONE  Physical Exam         General: Alert, awake, in no acute distress. Frail and cachectic. ENT: Weak voice.  No obvious stridor.  Radiation changes noted to neck. Heart: Regular rate and rhythm. No murmur appreciated. Lungs: Fair air movement, slight wheeze Abdomen: Soft, nontender, nondistended, positive bowel sounds.  Ext: No significant edema Skin: Warm and dry Neuro: Grossly intact, nonfocal.     Vital Signs: BP 109/78 (BP Location: Right Arm)   Pulse 98   Temp 98.2 F (36.8 C) (Oral)   Resp 18   Ht 5\' 11"  (1.803 m)   Wt 49.8 kg   SpO2 96%   BMI 15.31 kg/m  SpO2: SpO2: 96 % O2 Device: O2 Device: Nasal Cannula O2 Flow Rate: O2 Flow Rate (L/min): 3 L/min  Intake/output summary:   Intake/Output Summary (Last 24 hours) at 03/15/2020 1713 Last data filed at 03/15/2020 1300 Gross per 24 hour  Intake 0 ml  Output 2250 ml  Net -2250 ml   LBM: Last BM Date: 03/14/20 Baseline Weight: Weight: 50 kg Most recent weight: Weight: 49.8 kg       Palliative Assessment/Data:    Flowsheet Rows   Flowsheet Row Most Recent Value  Intake Tab   Referral Department Hospitalist  Unit at Time of Referral ER  Palliative Care Primary Diagnosis Cancer  Palliative Care Type Return patient Palliative Care  Reason for referral Clarify Goals of Care  Date first seen by  Palliative Care 03/12/20  Clinical Assessment   Palliative Performance Scale Score 40%  Psychosocial & Spiritual Assessment   Palliative Care Outcomes   Patient/Family meeting held? Yes  Who was at the meeting? patient, spoke with sister via telephone  Palliative Care Outcomes Clarified goals of care, Provided end of life care assistance, Provided psychosocial or spiritual support, Linked to palliative care logitudinal support, ACP counseling assistance      Patient Active Problem List   Diagnosis Date Noted  . Hypoxia   . Acute cystitis without hematuria   . Squamous cell carcinoma of lung ([place])   . Weakness   . Malignant neoplasm of lower lobe of left lung ([place]) 02/19/2020  . Acute respiratory insufficiency   . Squamous cell carcinoma of neck   . Goals of care, counseling/discussion   . Tracheostomy care ([place])   . Cancer of hypopharynx ([place]) 07/11/2019  . Lung mass   . Neck mass   . Pressure injury of skin 07/08/2019  . Aspiration pneumonia ([place]) 07/07/2019  . Dysphagia 07/07/2019  . Severe protein-calorie malnutrition ([place]) 07/07/2019  . Alcohol use 07/07/2019  . Sepsis ([place]) 04/17/2019  . COPD exacerbation ([place])   . Lactic acidosis   . Tobacco abuse   . Alcohol abuse     Palliative Care Assessment & Plan   Patient Profile:  65 y.o. 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 CTA without PE but reveals left lower lobe pneumonia, LLL bronchus completely obscured ? Debris, and concern for new metastatic lung lesions of RUL and RLL. UA concerning for UTI. Started on antibiotics. Oncology following,  well known to Dr. Benay Spice. Poor candidate for  chemotherapy due to poor nutritional and functional status. Palliative medicine consultation for goals of care.   Assessment:  sepsis present on admission and acute hypoxic resp failure, UTI and lobar PNA.  Lung cancer, with new right lung massx2 , concerning for metastatic disease oncology following,h/o laryngeal cancer, appear cured by radiation treatment per chart review,status post trach (decannulated in 08/2019),dysphagia from XRT s/p PEG  Recommendations/Plan:  Full code/full scope treatment  He endorsed today that he would consent to tracheostomy if recommended.  His sister, Devon Peterson, is his [facility] POA agent   Med onc following.  No other palliative specific recommendations at this point, however, we will continue to follow and progress conversation as appropriate based upon his clinical course.   Goals of Care and Additional Recommendations:  Limitations on Scope of Treatment: Full Scope Treatment  Code Status: .    Code Status Orders  (From admission, onward)         Start     Ordered   03/11/20 0126  Full code  Continuous        03/11/20 0125        Code Status History    Date Active Date Inactive Code Status Order ID Comments User Context   07/07/2019 2004 08/28/2019 1932 Full Code [id number]  Shela Leff, MD ED   04/17/2019 1638 04/19/2019 1957 Full Code [id number]  Loletha Grayer, MD ED   Advance Care Planning Activity    Advance Directive Documentation   Flowsheet Row Most Recent Value  Type of Advance Directive Healthcare Power of [place], Living will  [per previous hx]  Pre-existing out of facility DNR order (yellow form or pink MOST form) -  "MOST" Form in Place? -       Prognosis:   Unable to determine  Discharge Planning:  To Be Determined  Care plan was discussed with  Patient.   Thank you for allowing the Palliative Medicine Team to assist in the care of this patient.   Time In: 1600 Time Out: 1650 Total Time 50 Prolonged Time Billed No     Greater than 50%  of this time was spent counseling and coordinating care related to the above assessment and plan.  Micheline Rough, MD  Please contact Palliative Medicine Team phone at [phone] for questions and concerns.

## 2020-03-15 NOTE — Progress Notes (Signed)
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  In early December 2021 approximately 4 weeks prior to admission had CT biopsy of a left lower lobe lung mass that was enlarging.  Positive for squamous cell carcinoma.  He saw his primary oncologist Dr. Malachy Mood few days prior to Christmas 2021.  At that point in time he was taking a partial diet by mouth.  And he only had mild odynophagia.  He has been receiving radiation therapy with Dr. Lowella Dandy last March 10, 2019.  He was living at [facility] he presented on March 11, 2019 with few days of cough and increased sputum production and fevers.  Diagnosed with urinary tract infection because of a dirty UA.  Covid was negative.  He did require 2 L of oxygen.  At the time of admission 03/10/2020: He had CT angiogram without any pulmonary embolism but he had left lower lobe consolidation with left lower lobe bronchus complete "by debris.  CT also concerning for new metastatic lung lesions right upper and lower lobes.  Unclear course of the next 4 days but today on the hospitalist admission 03/14/2020 noted to have stridor.  Chest x-ray 03/14/2020 showed worsening left lower lobe collapse in the last 4 days since admission and pulmonary consulted.  He has been afebrile since the day after arrival since March 11, 2020  He is considered a very poor candidate for chemotherapy given ECOG 4, cachexia  He had swallow eval 03/14/2020: Results are pending  Other past medical history listed below includes aspiration pneumonia.  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 Left lower lobe of the lung mass concerning for primary neoplasm versus metastatic disease -07/07/2019 CT angiogram of the chest 2.7 cm mass in the left lower lobe of the lung -CT 08/17/2019-persistent low-attenuation lesion in the superior segment of left lower lobe with surrounding consolidation/collapse, stable from 07/07/2019, consolidation and groundglass opacity in the left lower lobe have largely resolved -08/20/2019 bronchoscopy-biopsy nondiagnostic -CT chest 11/02/2019-, increased in size concerning for a primary lung malignancy versus a metastasis versus pulmonary abscess, improved left lower lobe aeration, COPD             -PET scan 11/26/2019-3.7 cm left lower lobe pulmonary mass, centrally necrotic and markedly hypermetabolic.  Second tiny 7 mm irregular nodular opacity in the superior segment left lower lobe shows low-level FDG accumulation.  Low-level uptake in an enlarged AP window lymph node.  Mild hypermetabolism in a normal-appearing left axillary lymph node, felt to be reactive.  Bony uptake identified in the pelvis without underlying lesions evident by CT.           -CT biopsy of left lung mass 02/11/2020-squamous cell carcinoma  - visit Dr Adolph Pollack 12/21?21 - Mr. Prout returns for a scheduled visit.  He underwent a CT-guided  biopsy of the left lung mass on 02/11/2020 revealed squamous cell carcinoma.  Mr. Tanzi denies dyspnea.  He has mild pain with swallowing.   No other complaint.  He reports taking a partial diet by mouth.  He continues tube feedings.  -Last SBRT 03/09/20  3. Cachexia 4. History of aspiration pneumonia 5. Mild anemia 6. Atrial fibrillation with RVR 6. COPD 8. Alcohol abuse 9. Tobacco dependence 10. Cocaine abuse 11. Odynophagia secondary to radiation-improved   has a past medical history of Cancer ([place]), COPD (chronic obstructive pulmonary disease) ([place]), and Medical history non-contributory.   reports that he quit smoking about 8 months ago. His smoking use included cigarettes. He has a 39.00 pack-year smoking history. He has never used smokeless tobacco.  Past Surgical History:  Procedure Laterality Date  . BIOPSY  08/20/2019   Procedure: BIOPSY;  Surgeon: Chesley Mires, MD;  Location: [facility];  Service: Endoscopy;;  . BRONCHIAL WASHINGS  08/20/2019   Procedure: BRONCHIAL WASHINGS;  Surgeon: Chesley Mires, MD;  [facility];  Service: Endoscopy;;  LLL BAL  . DIRECT LARYNGOSCOPY N/A 07/09/2019   Procedure: DIRECT LARYNGOSCOPY WITH BIOPSY;  Surgeon: Jerrell Belfast, MD;  Location: [facility];  Service: ENT;  Laterality: N/A;  . IR FLUORO RM 30-60 MIN  07/13/2019  . IR GASTROSTOMY TUBE MOD SED  07/14/2019  . IR REPLACE G-TUBE SIMPLE WO FLUORO  10/09/2019  . NO PAST SURGERIES    . TRACHEOSTOMY TUBE PLACEMENT N/A 07/09/2019   Procedure: TRACHEOSTOMY;  Surgeon: Jerrell Belfast, MD;  [facility];  Service: ENT;  Laterality: N/A;  . VIDEO BRONCHOSCOPY Left 08/20/2019   Procedure: VIDEO BRONCHOSCOPY WITH FLUORO;  Surgeon: Chesley Mires, MD;  [facility];  Service: Endoscopy;  Laterality: Left;    Allergies  Allergen Reactions  . Albumin (Human) Itching    May be able to handle at low rates.    Immunization History  Administered Date(s) Administered  . Influenza-Unspecified 12/11/2019  . Moderna Sars-Covid-2 Vaccination 10/09/2019, 11/06/2019  . PFIZER SARS-COV-2 Vaccination 03/02/2020  .  Pneumococcal Polysaccharide-23 04/19/2019    Family History  Problem Relation Age of Onset  . CAD Father      Current Facility-Administered Medications:  .  0.45 % sodium chloride infusion, , Intravenous, Continuous, Adhikari, Amrit, MD, Last Rate: 75 mL/hr at 03/15/20 0257, New Bag at 03/15/20 0257 .  acetaminophen (TYLENOL) tablet 650 mg, 650 mg, Oral, Q6H PRN **OR** acetaminophen (TYLENOL) suppository 650 mg, 650 mg, Rectal, Q6H PRN, Marylyn Ishihara, Tyrone A, DO .  albuterol (PROVENTIL) (2.5 MG/3ML) 0.083% nebulizer solution 2.5 mg, 2.5 mg, Nebulization, Q2H PRN, Tawanna Solo, Amrit, MD, 2.5 mg at 03/14/20 1342 .  cefTRIAXone (ROCEPHIN) 1 g in sodium chloride 0.9 % 100 mL IVPB, 1 g, Intravenous, Q24H, Florencia Reasons, MD, Last Rate: 200 mL/hr at 03/14/20 2119, 1 g at 03/14/20 2119 .  chlorhexidine (PERIDEX) 0.12 % solution 15 mL, 15 mL, Mouth Rinse, BID, Florencia Reasons, MD, 15 mL at 03/15/20 1008 .  dexamethasone (DECADRON) injection 4 mg, 4 mg, Intravenous, Q6H, Sham Alviar, MD, 4 mg at 03/15/20 0046 .  enoxaparin (LOVENOX) injection 40 mg, 40 mg, Subcutaneous, Q24H, Kyle, Tyrone A, DO, 40 mg at 03/15/20 1010 .  feeding supplement (OSMOLITE 1.5 CAL) liquid 1,000 mL, 1,000 mL, Per Tube, Q24H, Florencia Reasons, MD, Last Rate: 50 mL/hr at 03/14/20 1354, 1,000 mL at 03/14/20 1354 .  feeding supplement (PROSource TF) liquid 45 mL, 45 mL, Per Tube, Daily, Florencia Reasons, MD, 45 mL at 03/15/20  1008 .  free water 200 mL, 200 mL, Per Tube, Q4H, Florencia Reasons, MD, 200 mL at 03/15/20 0402 .  insulin aspart (novoLOG) injection 0-15 Units, 0-15 Units, Subcutaneous, Q4H, Brand Males, MD, 3 Units at 03/15/20 1009 .  Ipratropium-Albuterol (COMBIVENT) respimat 1 puff, 1 puff, Inhalation, BID, Florencia Reasons, MD, 1 puff at 03/15/20 0831 .  lidocaine (XYLOCAINE) 1 % (with pres) injection, , , ,  .  MEDLINE mouth rinse, 15 mL, Mouth Rinse, q12n4p, Florencia Reasons, MD, 15 mL at 03/14/20 1715 .  metroNIDAZOLE (FLAGYL) IVPB 500 mg, 500 mg, Intravenous, Q8H,  Kyle, Tyrone A, DO, Last Rate: 100 mL/hr at 03/15/20 0300, 500 mg at 03/15/20 0300 .  nystatin (MYCOSTATIN) 100000 UNIT/ML suspension 500,000 Units, 5 mL, Oral, QID PRN, Marylyn Ishihara, Tyrone A, DO .  nystatin (MYCOSTATIN) 100000 UNIT/ML suspension 500,000 Units, 5 mL, Oral, QID, Florencia Reasons, MD, 500,000 Units at 03/15/20 1008 .  oxyCODONE (Oxy IR/ROXICODONE) immediate release tablet 5 mg, 5 mg, Oral, Q8H PRN, Kyle, Tyrone A, DO, 5 mg at 03/15/20 1009 .  senna (SENOKOT) tablet 17.2 mg, 2 tablet, Oral, Daily, Kyle, Tyrone A, DO, 17.2 mg at 03/15/20 1009 .  thiamine tablet 100 mg, 100 mg, Oral, Daily, Kyle, Tyrone A, DO, 100 mg at 03/15/20 [facility] Events:  03/11/2019: Admit 03/13/2019: Palliative care consult full code 03/14/2020: Pulmonary consult and swallow eval  Consults:  x  Procedures:  xx  Significant Diagnostic Tests:  x  Micro Data:  x  Antimicrobials:  x   Interim History / Subjective:    03/15/2020- no change . STridor better this morning. On decadron. ENT eval yesterday -<> ".  The cords are limited visibility.  There is either necrosis or possibly a mass arising from the right side posterior larynx.  I cannot say if the cords are mobile as they are barely visible.  The piriform sinuses are not visible either.  There is significant edema of the supraglottic larynx". CT neck -> Circumferential mucosal thickening of the supraglottic larynx with narrowing of the transverse dimension of the airway to 3 mm. Laryngeal thickening is markedly worsened compared to 11/02/2019. New/recurrent tumor or post radiation change could both account for this appearance.  CT chest with volume loss left side and contraltateral R Pulm mets - similar to admit 03/10/20 but worse since aug 2021 (Findings could represent any combination of aspiration, pneumonia, atelectasis, evolving postradiation change and/or residual tumor. 2. Multiple (at least 5) solid pulmonary nodules  scattered throughout the right lung, all new or increased since 11/02/2019 chest CT and unchanged since recent 03/11/2019 chest CT. 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 rate 18, height _0  (1.803 m), weight 49.8 kg, SpO2 100 %.        Intake/Output Summary (Last 24 hours) at 03/15/2020 1054 Last data filed at 03/15/2020 0900 Gross per 24 hour  Intake 1664.24 ml  Output 2100 ml  Net -435.76 ml   Filed Weights   03/10/20 1042 03/12/20 1748  Weight: 50 kg 49.8 kg    Examination: General: frail cachectic male with simproved stridor HENT: weak voice but no obvious stridor. Skin over neck is dark and tight after XRRT Lungs: some wheeze but suspected is transmitted Cardiovascular: Normal heart sounds no diaphoresis Abdomen: Soft nontender Extremities: Cachectic but intact Neuro:  Alert and oriented x3 GU: Appears intact  Resolved Hospital Problem list   x  Assessment & Plan:   #Status post tracheostomy decannulation May 2021 for Head and neck cancer   #Stridor 03/14/20 due to XRT may 2021 neck followed by Jan 2022 LLL lung - related necrosis versus recurrence of cancer   - improved 03/15/20 with decadron  #Worsening left lobe collapse due to LLL cancer +/- aspiration due to above - lung - worse since aug 2021 but similr 03/10/20 and 03/14/20   #Acute respiratory distress secondary to the above  - improved 03/15/20   Plan - per his goals of care - I advised him prognosis is bad and to consider symptom based Rx at home for reminder of natural life. He said "ok" but also said "want to keep trying". He shrugged his shoulders when I asked "what is no chemo options". Did not give an answer. He reluctantly said he would want tracheostomy if his options are running out  Based on above  - abx for  potential aspiration as below per triad  - needs trach for airway protection - during this time +/- vocial cord eval for recurrence of cancer and prognosis  - bronch depending on course but no role without securing airway  - support ongoing goals of care  Ccm will sign off  Best practice (evaluated daily)  According to the hospitalist*  Goals of Care:  Part of care following       ATTESTATION & SIGNATURE  Dr. Brand Males, M.D., [facility].C.P Pulmonary and Critical Care Medicine Staff Physician [facility] Pager: [phone], If no answer or between  15:00h - 7:00h: call [phone]  03/15/2020 10:54 AM    LABS    PULMONARY No results for input(s): PHART, PCO2ART, PO2ART, HCO3, TCO2, O2SAT in the last 168 hours.  Invalid input(s): PCO2, PO2  CBC Recent Labs  Lab 03/10/20 1007 03/12/20 0348  HGB 11.9* 10.8*  HCT 39.2 36.0*  WBC 12.5* 7.2  PLT 350 270    COAGULATION Recent Labs  Lab 03/10/20 1055 03/11/20 0337  INR 1.1 1.1    CARDIAC  No results for input(s): TROPONINI in the last 168 hours. No results for input(s): PROBNP in the last 168 hours.   CHEMISTRY Recent Labs  Lab 03/11/20 0508 03/12/20 0348 03/13/20 0558 03/14/20 0535 03/14/20 1538 03/15/20 0543  NA 156* 154* 150* 147*  --  145  K 4.0 3.6 2.6* 2.8* 3.3* 3.6  CL 116* 118* 114* 110  --  107  CO2 _0 --  28  GLUCOSE 96 138* 128* 168*  --  178*  BUN 24* _1 --  11  CREATININE 0.78 0.70 0.65 0.68  --  0.61  CALCIUM 9.3 8.7* 8.2* 8.0*  --  8.5*  MG  --  1.9 1.7 1.5*  --  1.7   Estimated Creatinine Clearance: 65.7 mL/min (by C-G formula based on SCr of 0.61 mg/dL).   LIVER Recent Labs  Lab 03/10/20 1055 03/11/20 0337  AST 28  --   ALT 18  --   ALKPHOS 108  --   BILITOT 0.4  --   PROT 9.7*  --   ALBUMIN 3.3*  --   INR 1.1 1.1     INFECTIOUS Recent Labs  Lab 03/10/20 1058 03/10/20 1352 03/11/20 0508  03/12/20 0348  LATICACIDVEN 2.5* 2.1*  --  1.4  PROCALCITON  --   --  0.57  --      ENDOCRINE CBG (last 3)  Recent Labs    03/15/20 0000 03/15/20 0405 03/15/20 0737  GLUCAP 156* 149* 163*         IMAGING x48h  - image(s) personally visualized  -   highlighted in bold CT SOFT TISSUE NECK WO CONTRAST  Result Date: 03/14/2020 CLINICAL DATA:  Stridor.  Head and neck cancer. EXAM: CT NECK WITHOUT CONTRAST TECHNIQUE: Multidetector CT imaging of the neck was performed following the standard protocol without intravenous contrast. COMPARISON:  CT neck 11/02/2019 Head CT 11/26/2019 FINDINGS: Pharynx and larynx: Lack of intravenous contrast agent and marked motion at the level of the epiglottis and larynx limits study. Within that limitation, there is circumferential mucosal thickening of the supraglottic larynx with narrowing of the transverse dimension of the airway to 3 mm. There is no retropharyngeal collection. The pharynx and oral cavity are unremarkable. Salivary glands: No inflammation, mass, or stone. Thyroid: Normal. Lymph nodes: None enlarged or abnormal density. Vascular: Atherosclerotic calcification of the internal carotid arteries. Limited intracranial: Negative. Visualized orbits: Negative. Mastoids and visualized paranasal sinuses: Clear. Skeleton: No acute or aggressive process. Upper chest: Please see dedicated report for concomitant chest CT. Other: None. IMPRESSION: 1. Lack of intravenous contrast agent and marked motion at the level of the epiglottis and larynx limits study. 2. Circumferential mucosal thickening of the supraglottic larynx with narrowing of the transverse dimension of the airway to 3 mm. Laryngeal thickening is markedly worsened compared to 11/02/2019. New/recurrent tumor or post radiation change could both account for this appearance. 3. No cervical lymphadenopathy. 4. Please see report for concomitant CT of the chest for discussion of left lung findings. Aortic  Atherosclerosis (ICD10-I70.0). Electronically Signed   By: Ulyses Jarred M.D.   On: 03/14/2020 20:37   CT CHEST WO CONTRAST  Result Date: 03/15/2020 CLINICAL DATA:  Inpatient. 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 EXAM: CT CHEST WITHOUT CONTRAST TECHNIQUE: Multidetector CT imaging of the chest was performed following the standard protocol without IV contrast. COMPARISON:  03/10/2020 chest CT angiogram. 11/26/2019 PET-CT. 11/02/2027 chest CT. FINDINGS: Cardiovascular: Normal heart size. No significant pericardial effusion/thickening. Left anterior descending coronary atherosclerosis. Great vessels are normal in course and caliber. Mediastinum/Nodes: No discrete thyroid nodules. Mildly patulous thoracic esophagus containing a small amount of oral contrast. No axillary adenopathy. Mild AP window adenopathy up to 1.1 cm (series 1/image 64), new. No discrete hilar adenopathy on these noncontrast images. Lungs/Pleura: No pneumothorax. Small dependent left pleural effusion. No right pleural effusion. Moderate centrilobular and paraseptal emphysema with mild diffuse bronchial wall thickening. Patchy debris throughout left lung airways. Dense patchy consolidation and volume loss throughout the lingula and left lower lobe with scattered air bronchograms, similar to 03/10/2020 chest CT angiogram study, obscuring the left lower lobe mass seen on 11/02/2019 chest CT. Multiple (at least 5) solid pulmonary nodules scattered throughout the right lung, all new or increased since 11/02/2019 chest CT and unchanged since recent 03/10/2020 chest CT angiogram study. Representative 10 mm right upper lobe nodule (series 5/image 55), increased from 3 mm on 11/02/2019 CT. Representative anterior right lower lobe 8 mm nodule (series 5/image 107), new. Upper abdomen: No acute abnormality. Musculoskeletal: No aggressive appearing focal  osseous lesions. Mild thoracic spondylosis. IMPRESSION: 1. Patchy debris throughout the left lung airways. Dense patchy consolidation and volume loss throughout the lingula and  left lower lobe with scattered air bronchograms, similar to recent 03/11/2019 chest CT angiogram study, obscuring the left lower lobe mass seen on 11/02/2019 chest CT. Findings could represent any combination of aspiration, pneumonia, atelectasis, evolving postradiation change and/or residual tumor. 2. Multiple (at least 5) solid pulmonary nodules scattered throughout the right lung, all new or increased since 11/02/2019 chest CT and unchanged since recent 03/11/2019 chest CT. 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 3. New mild AP window adenopathy, nonspecific, cannot exclude nodal metastasis versus reactive etiology. Attention on follow-up chest CT advised. 4. Small dependent left pleural effusion. 5. One vessel coronary atherosclerosis. 6. Aortic Atherosclerosis (ICD10-I70.0) and Emphysema (ICD10-J43.9). Electronically Signed   By: Ilona Sorrel M.D.   On: 03/15/2020 09:19   DG CHEST PORT 1 VIEW  Result Date: 03/14/2020 CLINICAL DATA:  Shortness of breath. EXAM: PORTABLE CHEST 1 VIEW COMPARISON:  CT chest and chest radiograph 03/10/2020. FINDINGS: Leftward shift of the heart mediastinum. Increasing collapse/consolidation in the left upper and left lower lobes. Large left pleural effusion. Apparent cut off of the left mainstem bronchus. Right lung is clear. Retained oral contrast is seen in the esophagus and stomach. IMPRESSION: Worsening collapse/consolidation in the lingula and left lower lobe with leftward shift of the heart and mediastinum, findings indicative of atelectasis/pneumonia due to worsening mucous plugging. Aspiration is not excluded. Electronically Signed   By: Lorin Picket M.D.   On: 03/14/2020 15:39   DG Swallowing Func-Speech Pathology  Result Date: 03/14/2020 Objective Swallowing  Evaluation: Type of Study: MBS-Modified Barium Swallow Study  Patient Details Name: Devon Peterson MRN: [id number] Date of Birth: February 18, 1956 Today's Date: 03/14/2020 Time: SLP Start Time (ACUTE ONLY): 1450 -SLP Stop Time (ACUTE ONLY): 1505 SLP Time Calculation (min) (ACUTE ONLY): 15 min Past Medical History: Past Medical History: Diagnosis Date . Cancer ([place])  . COPD (chronic obstructive pulmonary disease) ([place])  . Medical history non-contributory  Past Surgical History: Past Surgical History: Procedure Laterality Date . BIOPSY  08/20/2019  Procedure: BIOPSY;  Surgeon: Chesley Mires, MD;  [facility];  Service: Endoscopy;; . BRONCHIAL WASHINGS  08/20/2019  Procedure: BRONCHIAL WASHINGS;  Surgeon: Chesley Mires, MD;  [facility];  Service: Endoscopy;;  LLL BAL . DIRECT LARYNGOSCOPY N/A 07/09/2019  Procedure: DIRECT LARYNGOSCOPY WITH BIOPSY;  Surgeon: Jerrell Belfast, MD;  [facility];  Service: ENT;  Laterality: N/A; . IR FLUORO RM 30-60 MIN  07/13/2019 . IR GASTROSTOMY TUBE MOD SED  07/14/2019 . IR REPLACE G-TUBE SIMPLE WO FLUORO  10/09/2019 . NO PAST SURGERIES   . TRACHEOSTOMY TUBE PLACEMENT N/A 07/09/2019  Procedure: TRACHEOSTOMY;  Surgeon: Jerrell Belfast, MD;  [facility];  Service: ENT;  Laterality: N/A; . VIDEO BRONCHOSCOPY Left 08/20/2019  Procedure: VIDEO BRONCHOSCOPY WITH FLUORO;  Surgeon: Chesley Mires, MD;  [facility];  Service: Endoscopy;  Laterality: Left; HPI: Patient is a 65 y.o. male with PMH: metastatic lung cancer (just started round of XRT), COPD currently at [facility], who was found to be dyspneic and hypoxic and placed on 2L [place] and improved. He has had a couple days history of cough, increased sputnum producdtion and fevers so he was brought to ED.  CXR revealed Worsening collapse/consolidation in the lingula and left lower lobewith leftward shift of the heart and mediastinum, findings indicative of atelectasis/pneumonia due to worsening mucous plugging.  Aspiration is not excluded, COVID negative. Patient was seen by speech therapy during previous admission in June of 2021 for dysphagia.  Subjective: pleasant, cooperative Assessment /  Plan / Recommendation CHL IP CLINICAL IMPRESSIONS 03/14/2020 Clinical Impression Patient presents with a moderate oral and mod-severe pharyngeal dysphagia. As compared to previous MBS in June of 2020, swallow function has improved. He exhibited moderate vallecular residuals, mild pyriform and posterior pharyngeal wall residuals with puree solids but chin tuck did help clear this. Thin liquids and nectar thick liquids both resulted in diffuse residuals in pharynx, frequent penetration during and after swallow. In addition, esophageal backflow of boluses  were observed with thin/nectar liquids moving back into pharynx and with thin liquids, into laryngeal vestibule. Patient exhibited some belching and this was followed by him regurgitating a significant amount of barium mixed with secretions (this occured twice during study). Although aspiration was not observed, patient exhibited frequent penetration on residuals, which were never fully cleared from pharynx. In addition, esophageal backflow into pharynx and laryngeal vestibule also put patient at high risk of aspiration. SLP Visit Diagnosis -- Attention and concentration deficit following -- Frontal lobe and executive function deficit following -- Impact on safety and function --   CHL IP TREATMENT RECOMMENDATION 03/14/2020 Treatment Recommendations Therapy as outlined in treatment plan below   Prognosis 03/14/2020 Prognosis for Safe Diet Advancement Fair Barriers to Reach Goals Time post onset;Severity of deficits Barriers/Prognosis Comment -- CHL IP DIET RECOMMENDATION 03/14/2020 SLP Diet Recommendations NPO Liquid Administration via -- Medication Administration Via alternative means Compensations -- Postural Changes Seated upright at 90 degrees   CHL IP OTHER RECOMMENDATIONS 03/14/2020  Recommended Consults -- Oral Care Recommendations Oral care QID Other Recommendations --   CHL IP FOLLOW UP RECOMMENDATIONS 03/14/2020 Follow up Recommendations Skilled Nursing facility;24 hour supervision/assistance   CHL IP FREQUENCY AND DURATION 03/14/2020 Speech Therapy Frequency (ACUTE ONLY) min 2x/week Treatment Duration 1 week      CHL IP ORAL PHASE 03/14/2020 Oral Phase Impaired Oral - Pudding Teaspoon -- Oral - Pudding Cup -- Oral - Honey Teaspoon -- Oral - Honey Cup -- Oral - Nectar Teaspoon -- Oral - Nectar Cup -- Oral - Nectar Straw -- Oral - Thin Teaspoon Reduced posterior propulsion;Delayed oral transit;Weak lingual manipulation Oral - Thin Cup Lingual pumping;Delayed oral transit;Reduced posterior propulsion;Weak lingual manipulation Oral - Thin Straw -- Oral - Puree Delayed oral transit;Piecemeal swallowing;Reduced posterior propulsion;Weak lingual manipulation;Lingual pumping Oral - Mech Soft -- Oral - Regular -- Oral - Multi-Consistency -- Oral - Pill -- Oral Phase - Comment --  [facility] IP PHARYNGEAL PHASE 03/14/2020 Pharyngeal Phase Impaired Pharyngeal- Pudding Teaspoon -- Pharyngeal -- Pharyngeal- Pudding Cup -- Pharyngeal -- Pharyngeal- Honey Teaspoon -- Pharyngeal -- Pharyngeal- Honey Cup -- Pharyngeal -- Pharyngeal- Nectar Teaspoon -- Pharyngeal -- Pharyngeal- Nectar Cup Delayed swallow initiation-vallecula;Reduced pharyngeal peristalsis;Reduced laryngeal elevation;Pharyngeal residue - valleculae;Pharyngeal residue - pyriform;Pharyngeal residue - posterior pharnyx;Penetration/Aspiration during swallow;Penetration/Apiration after swallow Pharyngeal Material enters airway, remains ABOVE vocal cords and not ejected out Pharyngeal- Nectar Straw -- Pharyngeal -- Pharyngeal- Thin Teaspoon Reduced airway/laryngeal closure;Delayed swallow initiation-vallecula;Reduced laryngeal elevation;Penetration/Apiration after swallow;Penetration/Aspiration during swallow;Pharyngeal residue - valleculae;Pharyngeal residue  - pyriform;Pharyngeal residue - posterior pharnyx Pharyngeal Material enters airway, remains ABOVE vocal cords and not ejected out Pharyngeal- Thin Cup Delayed swallow initiation-vallecula;Reduced airway/laryngeal closure;Reduced laryngeal elevation;Penetration/Aspiration during swallow;Penetration/Apiration after swallow;Pharyngeal residue - valleculae;Pharyngeal residue - pyriform;Pharyngeal residue - posterior pharnyx Pharyngeal Material enters airway, remains ABOVE vocal cords and not ejected out Pharyngeal- Thin Straw -- Pharyngeal -- Pharyngeal- Puree Delayed swallow initiation-vallecula;Pharyngeal residue - valleculae;Pharyngeal residue - pyriform;Pharyngeal residue - posterior pharnyx Pharyngeal -- Pharyngeal- Mechanical Soft -- Pharyngeal -- Pharyngeal- Regular -- Pharyngeal -- Pharyngeal- Multi-consistency --  Pharyngeal -- Pharyngeal- Pill -- Pharyngeal -- Pharyngeal Comment --  [facility] IP CERVICAL ESOPHAGEAL PHASE 03/14/2020 Cervical Esophageal Phase Impaired Pudding Teaspoon -- Pudding Cup -- Honey Teaspoon -- Honey Cup -- Nectar Teaspoon -- Nectar Cup Esophageal backflow into the pharynx;Reduced cricopharyngeal relaxation Nectar Straw -- Thin Teaspoon -- Thin Cup Esophageal backflow into the pharynx;Reduced cricopharyngeal relaxation;Esophageal backflow into the larynx Thin Straw -- Puree Reduced cricopharyngeal relaxation Mechanical Soft -- Regular -- Multi-consistency -- Pill -- Cervical Esophageal Comment -- Sonia Baller, MA, CCC-SLP Speech Therapy

## 2020-03-15 NOTE — Progress Notes (Signed)
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 fib, aspiration pneumonia who was sent from skilled nursing facility for the evaluation of shortness of breath, cough, increased sputum production, fever and hypoxia.  CT imaging on presentation did not show any PE but showed left lower lobe pneumonia, debris in the left lower lobe bronchus.  Also showed new metastatic lung lesions on the right upper and lower lobes.  UA was also concerning for UTI.  Hospital course remarkable for electrolyte abnormalities.  Palliative care also consulted for goals of care.  Remains full code.  On 03/14/2020, he was found to have a stridor.  ENT consulted.   Assessment & Plan:   Active Problems:   Sepsis ([place])   Goals of care, counseling/discussion   Hypoxia   Acute cystitis without hematuria   Squamous cell carcinoma of lung (HCC)   Weakness   Sepsis secondary  lobar pneumonia: Suspected aspiration pneumonia.  Debris seen on the left lower lobe bronchus.  COVID screen negative.  Cultures have not shown any growth.  Speech therapy following.Plan for modified barium swallow.  Currently on Rocephin and Flagyl.  Currently afebrile.  Lactic acidosis, leukocytosis resolved.  We will change antibiotic to Augmentin when possible.  Acute hypoxic respiratory failure: Secondary to pneumonia.  Continue current  antibiotics.  Continue to wean the oxygen. He  is on 3-4 L of oxygen per minute . CXR on 03/15/19 showed Worsening collapse/consolidation  left lower lobe due to worsening mucous plugging.CXR also showed large pleural effusion,ordered US guided  thoracentesis but was not found to have significant fluid as per ultrasound.  PCCM were following,now signed off. CT chest on 03/15/19 showed patchy debris throughout the left lung airways,dense patchy consolidation and volume loss throughout the lingula and left lower lobe with scattered air bronchograms.Findings suggesting  combination of aspiration, pneumonia, atelectasis, evolving postradiation change and/or residual tumor.Also showed  solid pulmonary nodules scattered throughout the right lung.  Stridor/History of laryngeal cancer/dysphagia: Status post trach but decannulated on 6/21.  Developed dysphagia from radiation therapy.  Status post PEG.  Currently NPO and on tube feeding.  Speech is following.  CT soft tissue neck  showed circumferential mucosal thickening of the supraglottic larynx with narrowing of the transverse dimension of the airway to 3 mm,Laryngeal thickening has markedly worsened compared to 11/02/2019. New/recurrent tumor or post radiation change is a possibility. ENT following.  Plan for possible tracheostomy. Stridor has improved with decadron.  History of COPD: No wheezing.  Continue bronchodilators as needed  Hypernatremia:Suspected to be from dehydration.  Continue hypotonic fluids.  Check BMP tomorrow  Severe hypokalemia/hypomagnesemia: Continue supplementation and monitoring  Paroxysmal A. fib: Currently rate is controlled.  Not on anticoagulation.  On metoprolol.  Currently in normal sinus rhythm.  Severe protein calorie malnutrition: BMI of 15.31.  On tube feeding diet  Recently diagnosed right squamous cell lung cancer: Follows with Dr. Learta Codding.  CT imaging concerning for metastatic disease.  Will recommend outpatient follow-up with oncology.On radiation therapy  Oral thrush: On nystatin  Normocytic anemia: Most likely secondary to malignancy.  Hemoglobin currently stable  Goals of care: palliative  care had been following during  this admission.  Goals of  care discussed.  Plan is to continue full code and full scope of treatment for now.    Nutrition Problem: Inadequate oral intake Etiology: inability to eat      DVT prophylaxis:Lovnox Code Status: Full Family Communication: None at bedside Status is: Inpatient  Remains inpatient appropriate because:Inpatient level of care appropriate due to severity of illness   Dispo: The patient is from: SNF              Anticipated d/c is to: SNF              Anticipated d/c date is: 2 days              Patient currently is not medically stable to d/c.    Consultants: palliative care  Procedures: None  Antimicrobials:  Anti-infectives (From admission, onward)   Start     Dose/Rate Route Frequency Ordered Stop   03/13/20 2000  cefTRIAXone (ROCEPHIN) 1 g in sodium chloride 0.9 % 100 mL IVPB        1 g 200 mL/hr over 30 Minutes Intravenous Every 24 hours 03/13/20 1502     03/11/20 0800  ceFEPIme (MAXIPIME) 2 g in sodium chloride 0.9 % 100 mL IVPB  Status:  Discontinued        2 g 200 mL/hr over 30 Minutes Intravenous Every 8 hours 03/11/20 0732 03/13/20 1501   03/11/20 0200  metroNIDAZOLE (FLAGYL) IVPB 500 mg        500 mg 100 mL/hr over 60 Minutes Intravenous Every 8 hours 03/11/20 0125     03/10/20 2200  ceFEPIme (MAXIPIME) 2 g in sodium chloride 0.9 % 100 mL IVPB  Status:  Discontinued        2 g 200 mL/hr over 30 Minutes Intravenous Every 12 hours 03/10/20 1941 03/11/20 0732   03/10/20 2200  vancomycin (VANCOCIN) IVPB 1000 mg/200 mL premix  Status:  Discontinued        1,000 mg 200 mL/hr over 60 Minutes Intravenous Every 24 hours 03/10/20 1941 03/11/20 0801   03/10/20 1115  metroNIDAZOLE (FLAGYL) IVPB 500 mg        500 mg 100 mL/hr over 60 Minutes Intravenous  Once 03/10/20 1105 03/10/20 1456   03/10/20 1100  ceFEPIme (MAXIPIME) 2 g in sodium chloride 0.9 % 100 mL IVPB        2 g 200 mL/hr over 30 Minutes Intravenous  Once 03/10/20 1058 03/10/20 1247   03/10/20 1100   vancomycin (VANCOCIN) IVPB 1000 mg/200 mL premix        1,000 mg 200 mL/hr over 60 Minutes Intravenous  Once 03/10/20 1058 03/10/20 1331   03/10/20 1100  piperacillin-tazobactam (ZOSYN) IVPB 3.375 g  Status:  Discontinued        3.375 g 12.5 mL/hr over 240 Minutes Intravenous  Once 03/10/20 1058 03/10/20 1105      Subjective:  Patient seen and examined at bedside this morning.  Hemodynamically stable during evaluation.  His stridor has improved but he is still in mild  respiratory distress.  He is maintaining saturation on supplemental oxygen.  I discussed goals of care with him at the bedside and he wants to remain full code .  Objective: Vitals:   03/14/20 2030 03/14/20 2115 03/14/20 2117 03/15/20 0319  BP:  126/81  109/72  Pulse:  (!) 109 (!) 111 94  Resp:  18  18  Temp:  98.5 F (36.9 C)  (!) 97.3 F (36.3 C)  TempSrc:  Oral  Oral  SpO2: 97%  99% 97%  Weight:      Height:        Intake/Output Summary (Last 24 hours) at 03/15/2020 0827 Last data filed at 03/14/2020 2118 Gross per 24 hour  Intake 1664.24 ml  Output 2100 ml  Net -435.76 ml   Filed Weights   03/10/20 1042 03/12/20 1748  Weight: 50 kg 49.8 kg    Examination:   General exam: Extremity decondition, debilitated, malnourished HEENT: Tracheostomy scar Respiratory system: Diminished air sounds bilaterally Cardiovascular system: S1 & S2 heard, RRR. No JVD, murmurs, rubs, gallops or clicks. Gastrointestinal system: Abdomen is nondistended, soft and nontender. No organomegaly or masses felt. Normal bowel sounds heard. Central nervous system: Alert and oriented. No focal neurological deficits. Extremities: No edema, no clubbing ,no cyanosis Skin: minor skin breakdowns,no icterus ,no pallor  Data Reviewed: I have personally reviewed following labs and imaging studies  CBC: Recent Labs  Lab 03/10/20 1007 03/12/20 0348  WBC 12.5* 7.2  NEUTROABS  --  6.0  HGB 11.9* 10.8*  HCT 39.2 36.0*  MCV 77.6* 79.3*   PLT 350 720   Basic Metabolic Panel: Recent Labs  Lab 03/11/20 0508 03/12/20 0348 03/13/20 0558 03/14/20 0535 03/14/20 1538 03/15/20 0543  NA 156* 154* 150* 147*  --  145  K 4.0 3.6 2.6* 2.8* 3.3* 3.6  CL 116* 118* 114* 110  --  107  CO2 _0 --  28  GLUCOSE 96 138* 128* 168*  --  178*  BUN 24* _1 --  11  CREATININE 0.78 0.70 0.65 0.68  --  0.61  CALCIUM 9.3 8.7* 8.2* 8.0*  --  8.5*  MG  --  1.9 1.7 1.5*  --  1.7   GFR: Estimated Creatinine Clearance: 65.7 mL/min (by C-G formula based on SCr of 0.61 mg/dL). Liver Function Tests: Recent Labs  Lab 03/10/20 1055  AST 28  ALT 18  ALKPHOS 108  BILITOT 0.4  PROT 9.7*  ALBUMIN 3.3*   No results for input(s): LIPASE, AMYLASE in the last 168 hours. No results for input(s): AMMONIA in the last 168 hours. Coagulation Profile: Recent Labs  Lab 03/10/20 1055 03/11/20 0337  INR 1.1 1.1   Cardiac Enzymes: No results for input(s): CKTOTAL, CKMB, CKMBINDEX, TROPONINI in the last 168 hours. BNP (last 3 results) No results for input(s): PROBNP in the last 8760 hours. HbA1C: Recent Labs    03/14/20 1537  HGBA1C 6.3*   CBG: Recent Labs  Lab 03/14/20 2152 03/15/20 0000 03/15/20 0405 03/15/20 0737  GLUCAP 177* 156* 149* 163*   Lipid Profile: No results for input(s): CHOL, HDL, LDLCALC, TRIG, CHOLHDL, LDLDIRECT in the last 72 hours. Thyroid Function Tests: No results for input(s): TSH, T4TOTAL, FREET4, T3FREE, THYROIDAB in the last 72 hours. Anemia Panel: No results for input(s): VITAMINB12, FOLATE, FERRITIN, TIBC, IRON, RETICCTPCT in the last 72 hours. Sepsis Labs: Recent Labs  Lab 03/10/20 1058 03/10/20 1352 03/11/20 0508 03/12/20 0348  PROCALCITON  --   --  0.57  --   LATICACIDVEN 2.5* 2.1*  --  1.4    Recent Results (from the past 240 hour(s))  Blood Culture (routine x 2)     Status: None   Collection Time: 03/10/20 12:15 PM   Specimen: BLOOD LEFT FOREARM  Result Value Ref Range Status    Specimen Description BLOOD LEFT FOREARM  Final   Special Requests   Final  BOTTLES DRAWN AEROBIC AND ANAEROBIC Blood Culture adequate volume   Culture   Final    NO GROWTH 5 DAYS Performed at [facility], [place] [address], [place], [place] [address]    Report Status 03/15/2020 FINAL  Final  Blood Culture (routine x 2)     Status: None   Collection Time: 03/10/20 12:15 PM   Specimen: BLOOD RIGHT FOREARM  Result Value Ref Range Status   Specimen Description   Final    BLOOD RIGHT FOREARM Performed at [facility], [place] [address], [place], [place] [address]    Special Requests   Final    BOTTLES DRAWN AEROBIC AND ANAEROBIC Blood Culture results may not be optimal due to an inadequate volume of blood received in culture bottles Performed at [facility] [address], [place], [place] [address]    Culture   Final    NO GROWTH 5 DAYS Performed at [facility], [place] [address] [place], [place] [address]    Report Status 03/15/2020 FINAL  Final  Resp Panel by RT-PCR (Flu A&B, Covid) Nasopharyngeal Swab     Status: None   Collection Time: 03/10/20  1:00 PM   Specimen: Nasopharyngeal Swab; Nasopharyngeal(NP) swabs in vial transport medium  Result Value Ref Range Status   SARS Coronavirus 2 by RT PCR NEGATIVE NEGATIVE Final    Comment: (NOTE) SARS-CoV-2 target nucleic acids are NOT DETECTED.  The SARS-CoV-2 RNA is generally detectable in upper respiratory specimens during the acute phase of infection. The lowest concentration of SARS-CoV-2 viral copies this assay can detect is 138 copies/mL. A negative result does not preclude SARS-Cov-2 infection and should not be used as the sole basis for treatment or other patient management decisions. A negative result may occur with  improper specimen collection/handling, submission of specimen other than nasopharyngeal swab, presence of viral mutation(s) within the areas targeted by this assay, and  inadequate number of viral copies(<138 copies/mL). A negative result must be combined with clinical observations, patient history, and epidemiological information. The expected result is Negative.  Fact Sheet for Patients:  EntrepreneurPulse.com.au  Fact Sheet for Healthcare Providers:  IncredibleEmployment.be  This test is no t yet approved or cleared by the [place] FDA and  has been authorized for detection and/or diagnosis of SARS-CoV-2 by FDA under an Emergency Use Authorization (EUA). This EUA will remain  in effect (meaning this test can be used) for the duration of the COVID-19 declaration under Section 564(b)(1) of the Act, 21 U.S.C.section 360bbb-3(b)(1), unless the authorization is terminated  or revoked sooner.       Influenza A by PCR NEGATIVE NEGATIVE Final   Influenza B by PCR NEGATIVE NEGATIVE Final    Comment: (NOTE) The Xpert Xpress SARS-CoV-2/FLU/RSV plus assay is intended as an aid in the diagnosis of influenza from Nasopharyngeal swab specimens and should not be used as a sole basis for treatment. Nasal washings and aspirates are unacceptable for Xpert Xpress SARS-CoV-2/FLU/RSV testing.  Fact Sheet for Patients: EntrepreneurPulse.com.au  Fact Sheet for Healthcare Providers: IncredibleEmployment.be  This test is not yet approved or cleared by the [place] FDA and has been authorized for detection and/or diagnosis of SARS-CoV-2 by FDA under an Emergency Use Authorization (EUA). This EUA will remain in effect (meaning this test can be used) for the duration of the COVID-19 declaration under Section 564(b)(1) of the Act, 21 U.S.C. section 360bbb-3(b)(1), unless the authorization is terminated or revoked.  Performed at [facility], [place] [address], [place], [place] [address]   Urine culture     Status: None   Collection Time: 03/10/20  2:00 PM   Specimen:  In/Out Cath Urine  Result Value Ref Range Status   Specimen Description   Final    IN/OUT CATH URINE Performed at [facility] [address] [place], [place] [address]    Special Requests   Final    NONE Performed at [facility], [place] [address], [place], [place] [address]    Culture   Final    NO GROWTH Performed at [facility], [place] [address] [place], [place] [address]    Report Status 03/12/2020 FINAL  Final  MRSA PCR Screening     Status: None   Collection Time: 03/10/20  8:38 PM   Specimen: Nasopharyngeal  Result Value Ref Range Status   MRSA by PCR NEGATIVE NEGATIVE Final    Comment:        The GeneXpert MRSA Assay (FDA approved for NASAL specimens only), is one component of a comprehensive MRSA colonization surveillance program. It is not intended to diagnose MRSA infection nor to guide or monitor treatment for MRSA infections. Performed at [facility], [place] [address], [place], [place] [address]          Radiology Studies: CT SOFT TISSUE NECK WO CONTRAST  Result Date: 03/14/2020 CLINICAL DATA:  Stridor.  Head and neck cancer. EXAM: CT NECK WITHOUT CONTRAST TECHNIQUE: Multidetector CT imaging of the neck was performed following the standard protocol without intravenous contrast. COMPARISON:  CT neck 11/02/2019 Head CT 11/26/2019 FINDINGS: Pharynx and larynx: Lack of intravenous contrast agent and marked motion at the level of the epiglottis and larynx limits study. Within that limitation, there is circumferential mucosal thickening of the supraglottic larynx with narrowing of the transverse dimension of the airway to 3 mm. There is no retropharyngeal collection. The pharynx and oral cavity are unremarkable. Salivary glands: No inflammation, mass, or stone. Thyroid: Normal. Lymph nodes: None enlarged or abnormal density. Vascular: Atherosclerotic calcification of the internal carotid arteries. Limited  intracranial: Negative. Visualized orbits: Negative. Mastoids and visualized paranasal sinuses: Clear. Skeleton: No acute or aggressive process. Upper chest: Please see dedicated report for concomitant chest CT. Other: None. IMPRESSION: 1. Lack of intravenous contrast agent and marked motion at the level of the epiglottis and larynx limits study. 2. Circumferential mucosal thickening of the supraglottic larynx with narrowing of the transverse dimension of the airway to 3 mm. Laryngeal thickening is markedly worsened compared to 11/02/2019. New/recurrent tumor or post radiation change could both account for this appearance. 3. No cervical lymphadenopathy. 4. Please see report for concomitant CT of the chest for discussion of left lung findings. Aortic Atherosclerosis (ICD10-I70.0). Electronically Signed   By: Ulyses Jarred M.D.   On: 03/14/2020 20:37   DG CHEST PORT 1 VIEW  Result Date: 03/14/2020 CLINICAL DATA:  Shortness of breath. EXAM: PORTABLE CHEST 1 VIEW COMPARISON:  CT chest and chest radiograph 03/10/2020. FINDINGS: Leftward shift of the heart mediastinum. Increasing collapse/consolidation in the left upper and left lower lobes. Large left pleural effusion. Apparent cut off of the left mainstem bronchus. Right lung is clear. Retained oral contrast is seen in the esophagus and stomach. IMPRESSION: Worsening collapse/consolidation in the lingula and left lower lobe with leftward shift of the heart and mediastinum, findings indicative of atelectasis/pneumonia due to worsening mucous plugging. Aspiration is not excluded. Electronically Signed   By:  Lorin Picket M.D.   On: 03/14/2020 15:39   DG Swallowing Func-Speech Pathology  Result Date: 03/14/2020 Objective Swallowing Evaluation: Type of Study: MBS-Modified Barium Swallow Study  Patient Details Name: Lijah Bourque MRN: [id number] Date of Birth: 1955/08/19 Today's Date: 03/14/2020 Time: SLP Start Time (ACUTE ONLY): 1450 -SLP Stop Time (ACUTE ONLY): 1505  SLP Time Calculation (min) (ACUTE ONLY): 15 min Past Medical History: Past Medical History: Diagnosis Date . Cancer ([place])  . COPD (chronic obstructive pulmonary disease) ([place])  . Medical history non-contributory  Past Surgical History: Past Surgical History: Procedure Laterality Date . BIOPSY  08/20/2019  Procedure: BIOPSY;  Surgeon: Chesley Mires, MD;  Location: [facility];  Service: Endoscopy;; . BRONCHIAL WASHINGS  08/20/2019  Procedure: BRONCHIAL WASHINGS;  Surgeon: Chesley Mires, MD;  Location: [facility];  Service: Endoscopy;;  LLL BAL . DIRECT LARYNGOSCOPY N/A 07/09/2019  Procedure: DIRECT LARYNGOSCOPY WITH BIOPSY;  Surgeon: Jerrell Belfast, MD;  Location: [facility];  Service: ENT;  Laterality: N/A; . IR FLUORO RM 30-60 MIN  07/13/2019 . IR GASTROSTOMY TUBE MOD SED  07/14/2019 . IR REPLACE G-TUBE SIMPLE WO FLUORO  10/09/2019 . NO PAST SURGERIES   . TRACHEOSTOMY TUBE PLACEMENT N/A 07/09/2019  Procedure: TRACHEOSTOMY;  Surgeon: Jerrell Belfast, MD;  Location: [facility];  Service: ENT;  Laterality: N/A; . VIDEO BRONCHOSCOPY Left 08/20/2019  Procedure: VIDEO BRONCHOSCOPY WITH FLUORO;  Surgeon: Chesley Mires, MD;  Location: [facility];  Service: Endoscopy;  Laterality: Left; HPI: Patient is a 65 y.o. male with PMH: metastatic lung cancer (just started round of XRT), COPD currently at [facility], who was found to be dyspneic and hypoxic and placed on 2L [place] and improved. He has had a couple days history of cough, increased sputnum producdtion and fevers so he was brought to ED.  CXR revealed Worsening collapse/consolidation in the lingula and left lower lobewith leftward shift of the heart and mediastinum, findings indicative of atelectasis/pneumonia due to worsening mucous plugging. Aspiration is not excluded, COVID negative. Patient was seen by speech therapy during previous admission in June of 2021 for dysphagia.  Subjective: pleasant, cooperative Assessment / Plan / Recommendation CHL IP CLINICAL IMPRESSIONS 03/14/2020  Clinical Impression Patient presents with a moderate oral and mod-severe pharyngeal dysphagia. As compared to previous MBS in June of 2020, swallow function has improved. He exhibited moderate vallecular residuals, mild pyriform and posterior pharyngeal wall residuals with puree solids but chin tuck did help clear this. Thin liquids and nectar thick liquids both resulted in diffuse residuals in pharynx, frequent penetration during and after swallow. In addition, esophageal backflow of boluses  were observed with thin/nectar liquids moving back into pharynx and with thin liquids, into laryngeal vestibule. Patient exhibited some belching and this was followed by him regurgitating a significant amount of barium mixed with secretions (this occured twice during study). Although aspiration was not observed, patient exhibited frequent penetration on residuals, which were never fully cleared from pharynx. In addition, esophageal backflow into pharynx and laryngeal vestibule also put patient at high risk of aspiration. SLP Visit Diagnosis -- Attention and concentration deficit following -- Frontal lobe and executive function deficit following -- Impact on safety and function --   CHL IP TREATMENT RECOMMENDATION 03/14/2020 Treatment Recommendations Therapy as outlined in treatment plan below   Prognosis 03/14/2020 Prognosis for Safe Diet Advancement Fair Barriers to Reach Goals Time post onset;Severity of deficits Barriers/Prognosis Comment -- CHL IP DIET RECOMMENDATION 03/14/2020 SLP Diet Recommendations NPO Liquid Administration via -- Medication Administration Via alternative means  Compensations -- Postural Changes Seated upright at 90 degrees   CHL IP OTHER RECOMMENDATIONS 03/14/2020 Recommended Consults -- Oral Care Recommendations Oral care QID Other Recommendations --   CHL IP FOLLOW UP RECOMMENDATIONS 03/14/2020 Follow up Recommendations Skilled Nursing facility;24 hour supervision/assistance   CHL IP FREQUENCY AND  DURATION 03/14/2020 Speech Therapy Frequency (ACUTE ONLY) min 2x/week Treatment Duration 1 week      CHL IP ORAL PHASE 03/14/2020 Oral Phase Impaired Oral - Pudding Teaspoon -- Oral - Pudding Cup -- Oral - Honey Teaspoon -- Oral - Honey Cup -- Oral - Nectar Teaspoon -- Oral - Nectar Cup -- Oral - Nectar Straw -- Oral - Thin Teaspoon Reduced posterior propulsion;Delayed oral transit;Weak lingual manipulation Oral - Thin Cup Lingual pumping;Delayed oral transit;Reduced posterior propulsion;Weak lingual manipulation Oral - Thin Straw -- Oral - Puree Delayed oral transit;Piecemeal swallowing;Reduced posterior propulsion;Weak lingual manipulation;Lingual pumping Oral - Mech Soft -- Oral - Regular -- Oral - Multi-Consistency -- Oral - Pill -- Oral Phase - Comment --  CHL IP PHARYNGEAL PHASE 03/14/2020 Pharyngeal Phase Impaired Pharyngeal- Pudding Teaspoon -- Pharyngeal -- Pharyngeal- Pudding Cup -- Pharyngeal -- Pharyngeal- Honey Teaspoon -- Pharyngeal -- Pharyngeal- Honey Cup -- Pharyngeal -- Pharyngeal- Nectar Teaspoon -- Pharyngeal -- Pharyngeal- Nectar Cup Delayed swallow initiation-vallecula;Reduced pharyngeal peristalsis;Reduced laryngeal elevation;Pharyngeal residue - valleculae;Pharyngeal residue - pyriform;Pharyngeal residue - posterior pharnyx;Penetration/Aspiration during swallow;Penetration/Apiration after swallow Pharyngeal Material enters airway, remains ABOVE vocal cords and not ejected out Pharyngeal- Nectar Straw -- Pharyngeal -- Pharyngeal- Thin Teaspoon Reduced airway/laryngeal closure;Delayed swallow initiation-vallecula;Reduced laryngeal elevation;Penetration/Apiration after swallow;Penetration/Aspiration during swallow;Pharyngeal residue - valleculae;Pharyngeal residue - pyriform;Pharyngeal residue - posterior pharnyx Pharyngeal Material enters airway, remains ABOVE vocal cords and not ejected out Pharyngeal- Thin Cup Delayed swallow initiation-vallecula;Reduced airway/laryngeal closure;Reduced  laryngeal elevation;Penetration/Aspiration during swallow;Penetration/Apiration after swallow;Pharyngeal residue - valleculae;Pharyngeal residue - pyriform;Pharyngeal residue - posterior pharnyx Pharyngeal Material enters airway, remains ABOVE vocal cords and not ejected out Pharyngeal- Thin Straw -- Pharyngeal -- Pharyngeal- Puree Delayed swallow initiation-vallecula;Pharyngeal residue - valleculae;Pharyngeal residue - pyriform;Pharyngeal residue - posterior pharnyx Pharyngeal -- Pharyngeal- Mechanical Soft -- Pharyngeal -- Pharyngeal- Regular -- Pharyngeal -- Pharyngeal- Multi-consistency -- Pharyngeal -- Pharyngeal- Pill -- Pharyngeal -- Pharyngeal Comment --  CHL IP CERVICAL ESOPHAGEAL PHASE 03/14/2020 Cervical Esophageal Phase Impaired Pudding Teaspoon -- Pudding Cup -- Honey Teaspoon -- Honey Cup -- Nectar Teaspoon -- Nectar Cup Esophageal backflow into the pharynx;Reduced cricopharyngeal relaxation Nectar Straw -- Thin Teaspoon -- Thin Cup Esophageal backflow into the pharynx;Reduced cricopharyngeal relaxation;Esophageal backflow into the larynx Thin Straw -- Puree Reduced cricopharyngeal relaxation Mechanical Soft -- Regular -- Multi-consistency -- Pill -- Cervical Esophageal Comment -- Sonia Baller, MA, CCC-SLP Speech Therapy                  Scheduled Meds: . chlorhexidine  15 mL Mouth Rinse BID  . dexamethasone (DECADRON) injection  4 mg Intravenous Q6H  . enoxaparin (LOVENOX) injection  40 mg Subcutaneous Q24H  . feeding supplement (OSMOLITE 1.5 CAL)  1,000 mL Per Tube Q24H  . feeding supplement (PROSource TF)  45 mL Per Tube Daily  . free water  200 mL Per Tube Q4H  . insulin aspart  0-15 Units Subcutaneous Q4H  . Ipratropium-Albuterol  1 puff Inhalation BID  . mouth rinse  15 mL Mouth Rinse q12n4p  . nystatin  5 mL Oral QID  . senna  2 tablet Oral Daily  . thiamine  100 mg Oral Daily   Continuous Infusions: . sodium chloride 75 mL/hr at 03/15/20  1021  . cefTRIAXone (ROCEPHIN)   IV 1 g (03/14/20 2119)  . metronidazole 500 mg (03/15/20 0300)     LOS: 5 days    Time spent: 35 mins,More than 50% of that time was spent in counseling and/or coordination of care.      Shelly Coss, MD [facility] P1/01/2021, 8:27 AM

## 2020-03-15 NOTE — Evaluation (Signed)
Physical Therapy Evaluation Patient Details Name: Devon Peterson MRN: [id number] DOB: Apr 03, 1955 Today's Date: 03/15/2020   History of Present Illness  65 year old male with history of COPD, cocaine abuse, tobacco abuse, laryngeal cancer diagnosed in 2021  status post radiation therapy, status post trach and PEG placement, newly diagnosed left squamous cell lung cancer getting radiation therapy, A. fib, aspiration pneumonia who was sent from skilled nursing facility for the evaluation of shortness of breath, cough, increased sputum production, fever and hypoxia.  CT imaging on presentation did not show any PE but showed left lower lobe pneumonia, debris in the left lower lobe bronchus.  Also showed new metastatic lung lesions on the right upper and lower lobes.  Pt admitted for Sepsis secondary lobar pneumonia  Clinical Impression  Pt admitted with above diagnosis.  Pt currently with functional limitations due to the deficits listed below (see PT Problem List). Pt will benefit from skilled PT to increase their independence and safety with mobility to allow discharge to the venue listed below.   Pt agreeable to mobilize however provided minimal communication verbally.  Pt assisted OOB to recliner and requiring at least mod assist at this time.  Recommend pt return to SNF upon d/c.     Follow Up Recommendations SNF    Equipment Recommendations  None recommended by PT    Recommendations for Other Services       Precautions / Restrictions Precautions Precautions: Fall Precaution Comments: currently on 3L O2 [place], PEG      Mobility  Bed Mobility Overal bed mobility: Needs Assistance Bed Mobility: Supine to Sit     Supine to sit: Min guard;HOB elevated          Transfers Overall transfer level: Needs assistance Equipment used: Rolling walker (2 wheeled) Transfers: Sit to/from [facility] Sit to Stand: Mod assist Stand pivot transfers: Min assist       General  transfer comment: assist to rise and steady, pt with flexed posture, pt felt unable to ambulate so assisted safely to recliner, SPO2 87% on 3L O2 after transfer and pt with increased work of breathing which improved with 2-3 minutes rest  Ambulation/Gait                Stairs            Wheelchair Mobility    Modified Rankin (Stroke Patients Only)       Balance Overall balance assessment: Needs assistance         Standing balance support: Bilateral upper extremity supported Standing balance-Leahy Scale: Poor Standing balance comment: reliant on UE and external support                             Pertinent Vitals/Pain Pain Assessment: Faces Faces Pain Scale: No hurt Pain Intervention(s): Monitored during session;Repositioned    Home Living Family/patient expects to be discharged to:: Skilled nursing facility                      Prior Function Level of Independence: Needs assistance   Gait / Transfers Assistance Needed: using RW     Comments: pt not very vocal during session, per chart pt is from SNF and to return to SNF     Hand Dominance        Extremity/Trunk Assessment   Upper Extremity Assessment Upper Extremity Assessment: Generalized weakness    Lower Extremity Assessment Lower Extremity Assessment: Generalized weakness  Communication      Cognition Arousal/Alertness: Awake/alert Behavior During Therapy: Flat affect Overall Cognitive Status: Within Functional Limits for tasks assessed                                        General Comments      Exercises     Assessment/Plan    PT Assessment Patient needs continued PT services  PT Problem List Decreased strength;Decreased activity tolerance;Decreased knowledge of use of DME;Decreased mobility;Decreased balance;Cardiopulmonary status limiting activity       PT Treatment Interventions Gait training;DME instruction;Therapeutic  exercise;Functional mobility training;Therapeutic activities;Patient/family education;Balance training    PT Goals (Current goals can be found in the Care Plan section)  Acute Rehab PT Goals PT Goal Formulation: With patient Time For Goal Achievement: 03/29/20 Potential to Achieve Goals: Fair    Frequency Min 2X/week   Barriers to discharge        Co-evaluation               AM-PAC PT "6 Clicks" Mobility  Outcome Measure Help needed turning from your back to your side while in a flat bed without using bedrails?: A Little Help needed moving from lying on your back to sitting on the side of a flat bed without using bedrails?: A Little Help needed moving to and from a bed to a chair (including a wheelchair)?: A Lot Help needed standing up from a chair using your arms (e.g., wheelchair or bedside chair)?: A Lot Help needed to walk in hospital room?: A Lot Help needed climbing 3-5 steps with a railing? : Total 6 Click Score: 13    End of Session Equipment Utilized During Treatment: Gait belt;Oxygen Activity Tolerance: Patient limited by fatigue Patient left: in chair;with call bell/phone within reach Nurse Communication: Mobility status (Nurse tech aware pt in recliner, no chair alarm, pt aware to use call bell for safety) PT Visit Diagnosis: Other abnormalities of gait and mobility (R26.89)    Time: 6440-3474 PT Time Calculation (min) (ACUTE ONLY): 16 min   Charges:   PT Evaluation $PT Eval Low Complexity: 1 Low     Kati PT, DPT Acute Rehabilitation Services Pager: [phone] Office: [phone]  [name] E 03/15/2020, 3:16 PM

## 2020-03-15 NOTE — Progress Notes (Signed)
[name], male   DOB: [birth year], 65 y.o.   MRN: [id number] Pt presented to [place] dept today for left thoracentesis. On limited US of both left and right pleural spaces there is only minimal free fluid noted in left pleural space, none on right. Thoracentesis cancelled. Pt /TRH updated.

## 2020-03-16 ENCOUNTER — Ambulatory Visit
Admission: RE | Admit: 2020-03-16 | Discharge: 2020-03-16 | Disposition: A | Discharge status: Home / Self Care | Payer: Medicaid Other | Source: Ambulatory Visit | Attending: Radiation Oncology | Admitting: Radiation Oncology

## 2020-03-16 ENCOUNTER — Other Ambulatory Visit: Payer: Self-pay | Admitting: Otolaryngology

## 2020-03-16 ENCOUNTER — Encounter (HOSPITAL_COMMUNITY): Payer: Self-pay | Admitting: Certified Registered Nurse Anesthetist

## 2020-03-16 DIAGNOSIS — C329 Malignant neoplasm of larynx, unspecified: Secondary | ICD-10-CM | POA: Diagnosis not present

## 2020-03-16 DIAGNOSIS — N390 Urinary tract infection, site not specified: Secondary | ICD-10-CM | POA: Diagnosis not present

## 2020-03-16 DIAGNOSIS — J189 Pneumonia, unspecified organism: Secondary | ICD-10-CM | POA: Diagnosis not present

## 2020-03-16 DIAGNOSIS — C7802 Secondary malignant neoplasm of left lung: Secondary | ICD-10-CM | POA: Diagnosis not present

## 2020-03-16 DIAGNOSIS — A419 Sepsis, unspecified organism: Secondary | ICD-10-CM

## 2020-03-16 LAB — CBC WITH DIFFERENTIAL/PLATELET
Abs Immature Granulocytes: 0.07 10*3/uL (ref 0.00–0.07)
Basophils Absolute: 0 10*3/uL (ref 0.0–0.1)
Basophils Relative: 0 %
Eosinophils Absolute: 0 10*3/uL (ref 0.0–0.5)
Eosinophils Relative: 0 %
HCT: 29 % — ABNORMAL LOW (ref 39.0–52.0)
Hemoglobin: 9.2 g/dL — ABNORMAL LOW (ref 13.0–17.0)
Immature Granulocytes: 1 %
Lymphocytes Relative: 2 %
Lymphs Abs: 0.3 10*3/uL — ABNORMAL LOW (ref 0.7–4.0)
MCH: 23.7 pg — ABNORMAL LOW (ref 26.0–34.0)
MCHC: 31.7 g/dL (ref 30.0–36.0)
MCV: 74.6 fL — ABNORMAL LOW (ref 80.0–100.0)
Monocytes Absolute: 0.3 10*3/uL (ref 0.1–1.0)
Monocytes Relative: 2 %
Neutro Abs: 13.4 10*3/uL — ABNORMAL HIGH (ref 1.7–7.7)
Neutrophils Relative %: 95 %
Platelets: 330 10*3/uL (ref 150–400)
RBC: 3.89 MIL/uL — ABNORMAL LOW (ref 4.22–5.81)
RDW: 16.2 % — ABNORMAL HIGH (ref 11.5–15.5)
WBC: 14.1 10*3/uL — ABNORMAL HIGH (ref 4.0–10.5)
nRBC: 0.1 % (ref 0.0–0.2)

## 2020-03-16 LAB — BASIC METABOLIC PANEL
Anion gap: 13 (ref 5–15)
BUN: 16 mg/dL (ref 8–23)
CO2: 28 mmol/L (ref 22–32)
Calcium: 8.2 mg/dL — ABNORMAL LOW (ref 8.9–10.3)
Chloride: 101 mmol/L (ref 98–111)
Creatinine, Ser: 0.57 mg/dL — ABNORMAL LOW (ref 0.61–1.24)
GFR, Estimated: >60 mL/min (ref >60)
Glucose, Bld: 189 mg/dL — ABNORMAL HIGH (ref 70–99)
Potassium: 3.3 mmol/L — ABNORMAL LOW (ref 3.5–5.1)
Sodium: 142 mmol/L (ref 135–145)

## 2020-03-16 LAB — GLUCOSE, CAPILLARY
Glucose-Capillary: 104 mg/dL — ABNORMAL HIGH (ref 70–99)
Glucose-Capillary: 174 mg/dL — ABNORMAL HIGH (ref 70–99)
Glucose-Capillary: 174 mg/dL — ABNORMAL HIGH (ref 70–99)
Glucose-Capillary: 179 mg/dL — ABNORMAL HIGH (ref 70–99)
Glucose-Capillary: 183 mg/dL — ABNORMAL HIGH (ref 70–99)
Glucose-Capillary: 196 mg/dL — ABNORMAL HIGH (ref 70–99)
Glucose-Capillary: 99 mg/dL (ref 70–99)

## 2020-03-16 MED ORDER — POTASSIUM CHLORIDE CRYS ER 20 MEQ PO TBCR: 40.0000 meq | EXTENDED_RELEASE_TABLET | Freq: Once | ORAL | Status: DC

## 2020-03-16 MED ORDER — POTASSIUM CHLORIDE 20 MEQ PO PACK
40.0000 meq | PACK | Freq: Once | ORAL | Status: AC
  Administered 2020-03-16: 40 meq
  Filled 2020-03-16: qty 2

## 2020-03-16 MED ORDER — DEXAMETHASONE 4 MG PO TABS
4.0000 mg | ORAL_TABLET | Freq: Three times a day (TID) | ORAL | Status: AC
  Administered 2020-03-16 – 2020-03-18 (×6): 4 mg via ORAL
  Filled 2020-03-16 (×6): qty 1

## 2020-03-16 MED ORDER — AMOXICILLIN-POT CLAVULANATE 875-125 MG PO TABS
1.0000 | ORAL_TABLET | Freq: Two times a day (BID) | ORAL | Status: DC
  Administered 2020-03-16 – 2020-03-18 (×4): 1 via ORAL
  Filled 2020-03-16 (×4): qty 1

## 2020-03-16 MED ORDER — OXYCODONE HCL 5 MG PO TABS
5.0000 mg | ORAL_TABLET | ORAL | Status: DC | PRN
  Administered 2020-03-16 – 2020-03-18 (×5): 5 mg via ORAL
  Filled 2020-03-16 (×4): qty 1

## 2020-03-16 MED ORDER — DEXAMETHASONE 2 MG PO TABS: 2.0000 mg | ORAL_TABLET | Freq: Every day | ORAL | Status: DC

## 2020-03-16 MED ORDER — DEXAMETHASONE 2 MG PO TABS
2.0000 mg | ORAL_TABLET | Freq: Two times a day (BID) | ORAL | Status: DC
  Administered 2020-03-18: 2 mg via ORAL
  Filled 2020-03-16: qty 1

## 2020-03-16 MED ORDER — OXYCODONE HCL 5 MG PO TABS: 5.0000 mg | ORAL_TABLET | ORAL | Status: DC | PRN

## 2020-03-16 MED ORDER — IPRATROPIUM-ALBUTEROL 20-100 MCG/ACT IN AERS: 1.0000 | INHALATION_SPRAY | Freq: Four times a day (QID) | RESPIRATORY_TRACT | Status: DC | PRN

## 2020-03-16 MED ORDER — MORPHINE SULFATE (PF) 2 MG/ML IV SOLN
2.0000 mg | INTRAVENOUS | Status: DC | PRN
  Administered 2020-03-18: 2 mg via INTRAVENOUS
  Filled 2020-03-16: qty 1

## 2020-03-16 NOTE — Progress Notes (Addendum)
Upon reviewing Mr Devon Peterson's chart today and discussing him w/ medical oncology, I spoke with [name] and recommended the following:  I think it's reasonable to stop RT to his left lower lung mass after one or two more treatments. That would be enough to provide palliation.   A full course of RT (for aggressive local control) is unnecessary due to his declining status. He is agreeable.  I also recommend a palliative consult to discuss hospice w/ him (hospice was directly recommended by Dr Benay Spice). I think he's had outpatient palliative care but I hope our team can visit w/ him to facilitate hospice if he agrees. I have sent a message to our palliative team. Pt is interested in meeting w/ them.  We will treat him with Left lower lung RT tomorrow and Fri, and then stop RT. If he is discharged before Fri, or declines any fractions in the next couple days, we will still stop RT as of the end of Friday. He is agreeable to this too.  I will ask my team to tentatively schedule 1 mo f/u.  -----------------------------------  Eppie Gibson, MD

## 2020-03-16 NOTE — Progress Notes (Signed)
I have reviewed the CT of the neck and chest.  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  The only way we could know that would be with direct laryngoscopy and biopsy.  That would require tracheostomy first under local anesthetic and then he can safely be put to sleep for the laryngoscopy and biopsy.  If he has persistent disease in the hypopharynx then a laryngopharyngectomy would be the next step which would need to be done at one of the [facility] due to the need for reconstruction with a free flap.  Given the findings on CT of the chest is very unlikely that we will be able to achieve a long-term cure.  In that case tracheostomy may be necessary if his respiratory status deteriorates any further.  Please contact us if the need arises.

## 2020-03-16 NOTE — Progress Notes (Addendum)
HEMATOLOGY-ONCOLOGY PROGRESS NOTE  SUBJECTIVE: He denies shortness of breath. No pain.  PHYSICAL EXAMINATION:  Vitals:   03/15/20 2018 03/16/20 0434  BP: 95/66 107/74  Pulse: 93 98  Resp: 20 16  Temp: 98.2 F (36.8 C) 97.6 F (36.4 C)  SpO2:  96%   Filed Weights   03/10/20 1042 03/12/20 1748  Weight: 110 lb 3.7 oz (50 kg) 109 lb 12.6 oz (49.8 kg)    Intake/Output from previous day: 01/11 0701 - 01/12 0700 In: 0  Out: 2400 [Urine:2400]  GENERAL: Awake and alert, cachectic, no distress SKIN: skin color, texture, turgor are normal, no rashes or significant lesions LUNGS: Rhonchi present bilaterally HEART: regular rate & rhythm and no murmurs and no lower extremity edema ABDOMEN:abdomen soft, non-tender and normal bowel sounds. Left abdomen feeding tube.    LABORATORY DATA:  I have reviewed the data as listed CMP Latest Ref Rng & Units 03/16/2020 03/15/2020 03/14/2020  Glucose 70 - 99 mg/dL 189(H) 178(H) -  BUN 8 - 23 mg/dL 16 11 -  Creatinine 0.61 - 1.24 mg/dL 0.57(L) 0.61 -  Sodium 135 - 145 mmol/L 142 145 -  Potassium 3.5 - 5.1 mmol/L 3.3(L) 3.6 3.3(L)  Chloride 98 - 111 mmol/L 101 107 -  CO2 22 - 32 mmol/L 28 28 -  Calcium 8.9 - 10.3 mg/dL 8.2(L) 8.5(L) -  Total Protein 6.5 - 8.1 g/dL - - -  Total Bilirubin 0.3 - 1.2 mg/dL - - -  Alkaline Phos 38 - 126 U/L - - -  AST 15 - 41 U/L - - -  ALT 0 - 44 U/L - - -    Lab Results  Component Value Date   WBC 14.1 (H) 03/16/2020   HGB 9.2 (L) 03/16/2020   HCT 29.0 (L) 03/16/2020   MCV 74.6 (L) 03/16/2020   PLT 330 03/16/2020   NEUTROABS 13.4 (H) 03/16/2020    DG Chest 2 View  Result Date: 03/10/2020 CLINICAL DATA:  History of lung carcinoma.  Shortness of breath EXAM: CHEST - 2 VIEW COMPARISON:  Chest CT November 02, 2019; chest radiograph August 20, 2019. PET-CT November 26, 2019 FINDINGS: There is opacity in the posterior left base at the site of known mass. Mass is ill-defined by current radiographic examination with  suspected surrounding ill-defined pneumonitis. Elsewhere no edema or airspace opacity evident. Heart size and pulmonary vascular normal. No adenopathy. No bone lesions. IMPRESSION: Airspace opacity posterior left base, likely due to combination of mass and surrounding pneumonitis. Lungs elsewhere clear. Heart size normal. No adenopathy evident. Electronically Signed   By: Lowella Grip III M.D.   On: 03/10/2020 10:26   CT SOFT TISSUE NECK WO CONTRAST  Result Date: 03/14/2020 CLINICAL DATA:  [place].  Head and neck cancer. EXAM: CT NECK WITHOUT CONTRAST TECHNIQUE: Multidetector CT imaging of the neck was performed following the standard protocol without intravenous contrast. COMPARISON:  CT neck 11/02/2019 Head CT 11/26/2019 FINDINGS: Pharynx and larynx: Lack of intravenous contrast agent and marked motion at the level of the epiglottis and larynx limits study. Within that limitation, there is circumferential mucosal thickening of the supraglottic larynx with narrowing of the transverse dimension of the airway to 3 mm. There is no retropharyngeal collection. The pharynx and oral cavity are unremarkable. Salivary glands: No inflammation, mass, or stone. Thyroid: Normal. Lymph nodes: None enlarged or abnormal density. Vascular: Atherosclerotic calcification of the internal carotid arteries. Limited intracranial: Negative. Visualized orbits: Negative. Mastoids and visualized paranasal sinuses: Clear. Skeleton: No acute  or aggressive process. Upper chest: Please see dedicated report for concomitant chest CT. Other: None. IMPRESSION: 1. Lack of intravenous contrast agent and marked motion at the level of the epiglottis and larynx limits study. 2. Circumferential mucosal thickening of the supraglottic larynx with narrowing of the transverse dimension of the airway to 3 mm. Laryngeal thickening is markedly worsened compared to 11/02/2019. New/recurrent tumor or post radiation change could both account for this  appearance. 3. No cervical lymphadenopathy. 4. Please see report for concomitant CT of the chest for discussion of left lung findings. Aortic Atherosclerosis (ICD10-I70.0). Electronically Signed   By: Ulyses Jarred M.D.   On: 03/14/2020 20:37   CT CHEST WO CONTRAST  Result Date: 03/15/2020 CLINICAL DATA:  Inpatient. 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 EXAM: CT CHEST WITHOUT CONTRAST TECHNIQUE: Multidetector CT imaging of the chest was performed following the standard protocol without IV contrast. COMPARISON:  03/10/2020 chest CT angiogram. 11/26/2019 PET-CT. 11/02/2027 chest CT. FINDINGS: Cardiovascular: Normal heart size. No significant pericardial effusion/thickening. Left anterior descending coronary atherosclerosis. Great vessels are normal in course and caliber. Mediastinum/Nodes: No discrete thyroid nodules. Mildly patulous thoracic esophagus containing a small amount of oral contrast. No axillary adenopathy. Mild AP window adenopathy up to 1.1 cm (series 1/image 64), new. No discrete hilar adenopathy on these noncontrast images. Lungs/Pleura: No pneumothorax. Small dependent left pleural effusion. No right pleural effusion. Moderate centrilobular and paraseptal emphysema with mild diffuse bronchial wall thickening. Patchy debris throughout left lung airways. Dense patchy consolidation and volume loss throughout the lingula and left lower lobe with scattered air bronchograms, similar to 03/10/2020 chest CT angiogram study, obscuring the left lower lobe mass seen on 11/02/2019 chest CT. Multiple (at least 5) solid pulmonary nodules scattered throughout the right lung, all new or increased since 11/02/2019 chest CT and unchanged since recent 03/10/2020 chest CT angiogram study. Representative 10 mm right upper lobe nodule (series 5/image 55), increased from 3 mm on 11/02/2019 CT. Representative  anterior right lower lobe 8 mm nodule (series 5/image 107), new. Upper abdomen: No acute abnormality. Musculoskeletal: No aggressive appearing focal osseous lesions. Mild thoracic spondylosis. IMPRESSION: 1. Patchy debris throughout the left lung airways. Dense patchy consolidation and volume loss throughout the lingula and left lower lobe with scattered air bronchograms, similar to recent 03/11/2019 chest CT angiogram study, obscuring the left lower lobe mass seen on 11/02/2019 chest CT. Findings could represent any combination of aspiration, pneumonia, atelectasis, evolving postradiation change and/or residual tumor. 2. Multiple (at least 5) solid pulmonary nodules scattered throughout the right lung, all new or increased since 11/02/2019 chest CT and unchanged since recent 03/11/2019 chest CT. 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 3. New mild AP window adenopathy, nonspecific, cannot exclude nodal metastasis versus reactive etiology. Attention on follow-up chest CT advised. 4. Small dependent left pleural effusion. 5. One vessel coronary atherosclerosis. 6. Aortic Atherosclerosis (ICD10-I70.0) and Emphysema (ICD10-J43.9). Electronically Signed   By: Ilona Sorrel M.D.   On: 03/15/2020 09:19   CT Angio Chest PE W and/or Wo Contrast  Result Date: 03/10/2020 CLINICAL DATA:  Known lung cancer currently undergoing radiation therapy (last yesterday). Sepsis, tachycardia and hypoxia. COVID test was negative. Evaluate for pneumonia versus pulmonary embolus. EXAM: CT ANGIOGRAPHY CHEST WITH CONTRAST TECHNIQUE: Multidetector CT imaging of the chest was performed using the standard protocol during bolus administration of intravenous contrast. Multiplanar CT image reconstructions and MIPs were obtained to evaluate  the vascular anatomy. CONTRAST:  143m OMNIPAQUE IOHEXOL 350 MG/ML SOLN COMPARISON:  Recent PET-CT 11/26/2019; CT biopsy 02/11/2020 FINDINGS: Cardiovascular: Satisfactory opacification  of the pulmonary arteries to the segmental level. No evidence of pulmonary embolism. Normal heart size. No pericardial effusion. Coronary artery calcifications. Mediastinum/Nodes: No enlarged mediastinal, hilar, or axillary lymph nodes. Thyroid gland, trachea, and esophagus demonstrate no significant findings. Lungs/Pleura: Enlarging right upper lobe pulmonary nodule measures up to 0.9 cm and is highly concerning for metastatic disease. Enlarging nodule within the superior segment of the right lower lobe now measures 0.5 cm. Enlarging right lower lobe pulmonary nodule now measuring up to 0.7 cm. Interval development of extensive consolidative airspace opacities throughout the left lower lobe with associated volume loss. The left mainstem bronchus is completely obscured and may be stenosed or impacted with debris. The previously identified mass can be vaguely identified as low attenuation in the center of the consolidated left lower lobe. Background of centrilobular pulmonary emphysema. No pleural effusion. Upper Abdomen: No acute abnormality. Percutaneous gastrostomy tube in place. Musculoskeletal: No chest wall abnormality. No acute or significant osseous findings. Review of the MIP images confirms the above findings. IMPRESSION: 1. Negative for acute pulmonary embolus. 2. Interval development of dense consolidation and associated volume loss within the left lower lobe. The left lower lobe bronchus is also completely obscured and is likely impacted with debris. Findings are most concerning for lobar pneumonia. Acute radiation pneumonitis is possible but considered less likely given the timing (radiation therapy is still on going). 3. At least 3 enlarging pulmonary nodules are identified in the right upper and lower lobes. These were barely identifiable as punctate foci on the prior PET-CT. Enlargement over the past 3 months is highly concerning for metastatic disease. 4. Coronary artery calcifications. 5.  Percutaneous gastrostomy tube in place. 6. Centrilobular pulmonary emphysema. Emphysema (ICD10-J43.9). Electronically Signed   By: HJacqulynn CadetM.D.   On: 03/10/2020 17:23   [place]ST (PLEURAL EFFUSION)  Result Date: 03/15/2020 CLINICAL DATA:  65year old male referred for possible thoracentesis EXAM: CHEST ULTRASOUND COMPARISON:  CT 03/14/2020 FINDINGS: Ultrasound performed demonstrating scant pleural fluid on the left, with no pleural fluid on the right. Thoracentesis not performed at this time. IMPRESSION: Scant left-sided pleural fluid with no adequate window for aspiration. Electronically Signed   By: JCorrie MckusickD.O.   On: 03/15/2020 12:26   DG CHEST PORT 1 VIEW  Result Date: 03/14/2020 CLINICAL DATA:  Shortness of breath. EXAM: PORTABLE CHEST 1 VIEW COMPARISON:  CT chest and chest radiograph 03/10/2020. FINDINGS: Leftward shift of the heart mediastinum. Increasing collapse/consolidation in the left upper and left lower lobes. Large left pleural effusion. Apparent cut off of the left mainstem bronchus. Right lung is clear. Retained oral contrast is seen in the esophagus and stomach. IMPRESSION: Worsening collapse/consolidation in the lingula and left lower lobe with leftward shift of the heart and mediastinum, findings indicative of atelectasis/pneumonia due to worsening mucous plugging. Aspiration is not excluded. Electronically Signed   By: MLorin PicketM.D.   On: 03/14/2020 15:39   DG Swallowing Func-Speech Pathology  Result Date: 03/14/2020 Objective Swallowing Evaluation: Type of Study: MBS-Modified Barium Swallow Study  Patient Details Name: RKonnor VondrasekMRN: [id number] of Birth: [id number]'s Date: 03/14/2020 Time: SLP Start Time (ACUTE ONLY): 1450 -SLP Stop Time (ACUTE ONLY): 1505 SLP Time Calculation (min) (ACUTE ONLY): 15 min Past Medical History: Past Medical History: Diagnosis Date . Cancer ([place]  . COPD (chronic obstructive pulmonary disease) ([place]  .  Medical history  non-contributory  Past Surgical History: Past Surgical History: Procedure Laterality Date . BIOPSY  08/20/2019  Procedure: BIOPSY;  Surgeon: Chesley Mires, MD;  Location: [facility];  Service: Endoscopy;; . BRONCHIAL WASHINGS  08/20/2019  Procedure: BRONCHIAL WASHINGS;  Surgeon: Chesley Mires, MD;  Location: [facility];  Service: Endoscopy;;  LLL BAL . DIRECT LARYNGOSCOPY N/A 07/09/2019  Procedure: DIRECT LARYNGOSCOPY WITH BIOPSY;  Surgeon: Jerrell Belfast, MD;  Location: [facility];  Service: ENT;  Laterality: N/A; . IR FLUORO RM 30-60 MIN  07/13/2019 . IR GASTROSTOMY TUBE MOD SED  07/14/2019 . IR REPLACE G-TUBE SIMPLE WO FLUORO  10/09/2019 . NO PAST SURGERIES   . TRACHEOSTOMY TUBE PLACEMENT N/A 07/09/2019  Procedure: TRACHEOSTOMY;  Surgeon: Jerrell Belfast, MD;  Location: [facility];  Service: ENT;  Laterality: N/A; . VIDEO BRONCHOSCOPY Left 08/20/2019  Procedure: VIDEO BRONCHOSCOPY WITH FLUORO;  Surgeon: Chesley Mires, MD;  Location: [facility];  Service: Endoscopy;  Laterality: Left; HPI: Patient is a 65 y.o. male with PMH: metastatic lung cancer (just started round of XRT), COPD currently at [facility], who was found to be dyspneic and hypoxic and placed on 2L [place] and improved. He has had a couple days history of cough, increased sputnum producdtion and fevers so he was brought to ED.  CXR revealed Worsening collapse/consolidation in the lingula and left lower lobewith leftward shift of the heart and mediastinum, findings indicative of atelectasis/pneumonia due to worsening mucous plugging. Aspiration is not excluded, COVID negative. Patient was seen by speech therapy during previous admission in June of 2021 for dysphagia.  Subjective: pleasant, cooperative Assessment / Plan / Recommendation CHL IP CLINICAL IMPRESSIONS 03/14/2020 Clinical Impression Patient presents with a moderate oral and mod-severe pharyngeal dysphagia. As compared to previous MBS in June of 2020, swallow function has improved. He exhibited moderate  vallecular residuals, mild pyriform and posterior pharyngeal wall residuals with puree solids but chin tuck did help clear this. Thin liquids and nectar thick liquids both resulted in diffuse residuals in pharynx, frequent penetration during and after swallow. In addition, esophageal backflow of boluses  were observed with thin/nectar liquids moving back into pharynx and with thin liquids, into laryngeal vestibule. Patient exhibited some belching and this was followed by him regurgitating a significant amount of barium mixed with secretions (this occured twice during study). Although aspiration was not observed, patient exhibited frequent penetration on residuals, which were never fully cleared from pharynx. In addition, esophageal backflow into pharynx and laryngeal vestibule also put patient at high risk of aspiration. SLP Visit Diagnosis -- Attention and concentration deficit following -- Frontal lobe and executive function deficit following -- Impact on safety and function --   CHL IP TREATMENT RECOMMENDATION 03/14/2020 Treatment Recommendations Therapy as outlined in treatment plan below   Prognosis 03/14/2020 Prognosis for Safe Diet Advancement Fair Barriers to Reach Goals Time post onset;Severity of deficits Barriers/Prognosis Comment -- CHL IP DIET RECOMMENDATION 03/14/2020 SLP Diet Recommendations NPO Liquid Administration via -- Medication Administration Via alternative means Compensations -- Postural Changes Seated upright at 90 degrees   CHL IP OTHER RECOMMENDATIONS 03/14/2020 Recommended Consults -- Oral Care Recommendations Oral care QID Other Recommendations --   CHL IP FOLLOW UP RECOMMENDATIONS 03/14/2020 Follow up Recommendations Skilled Nursing facility;24 hour supervision/assistance   CHL IP FREQUENCY AND DURATION 03/14/2020 Speech Therapy Frequency (ACUTE ONLY) min 2x/week Treatment Duration 1 week      CHL IP ORAL PHASE 03/14/2020 Oral Phase Impaired Oral - Pudding Teaspoon -- Oral - Pudding Cup --  Oral - Honey Teaspoon -- Oral - Honey Cup -- Oral - Nectar Teaspoon -- Oral - Nectar Cup -- Oral - Nectar Straw -- Oral - Thin Teaspoon Reduced posterior propulsion;Delayed oral transit;Weak lingual manipulation Oral - Thin Cup Lingual pumping;Delayed oral transit;Reduced posterior propulsion;Weak lingual manipulation Oral - Thin Straw -- Oral - Puree Delayed oral transit;Piecemeal swallowing;Reduced posterior propulsion;Weak lingual manipulation;Lingual pumping Oral - Mech Soft -- Oral - Regular -- Oral - Multi-Consistency -- Oral - Pill -- Oral Phase - Comment --  CHL IP PHARYNGEAL PHASE 03/14/2020 Pharyngeal Phase Impaired Pharyngeal- Pudding Teaspoon -- Pharyngeal -- Pharyngeal- Pudding Cup -- Pharyngeal -- Pharyngeal- Honey Teaspoon -- Pharyngeal -- Pharyngeal- Honey Cup -- Pharyngeal -- Pharyngeal- Nectar Teaspoon -- Pharyngeal -- Pharyngeal- Nectar Cup Delayed swallow initiation-vallecula;Reduced pharyngeal peristalsis;Reduced laryngeal elevation;Pharyngeal residue - valleculae;Pharyngeal residue - pyriform;Pharyngeal residue - posterior pharnyx;Penetration/Aspiration during swallow;Penetration/Apiration after swallow Pharyngeal Material enters airway, remains ABOVE vocal cords and not ejected out Pharyngeal- Nectar Straw -- Pharyngeal -- Pharyngeal- Thin Teaspoon Reduced airway/laryngeal closure;Delayed swallow initiation-vallecula;Reduced laryngeal elevation;Penetration/Apiration after swallow;Penetration/Aspiration during swallow;Pharyngeal residue - valleculae;Pharyngeal residue - pyriform;Pharyngeal residue - posterior pharnyx Pharyngeal Material enters airway, remains ABOVE vocal cords and not ejected out Pharyngeal- Thin Cup Delayed swallow initiation-vallecula;Reduced airway/laryngeal closure;Reduced laryngeal elevation;Penetration/Aspiration during swallow;Penetration/Apiration after swallow;Pharyngeal residue - valleculae;Pharyngeal residue - pyriform;Pharyngeal residue - posterior pharnyx Pharyngeal  Material enters airway, remains ABOVE vocal cords and not ejected out Pharyngeal- Thin Straw -- Pharyngeal -- Pharyngeal- Puree Delayed swallow initiation-vallecula;Pharyngeal residue - valleculae;Pharyngeal residue - pyriform;Pharyngeal residue - posterior pharnyx Pharyngeal -- Pharyngeal- Mechanical Soft -- Pharyngeal -- Pharyngeal- Regular -- Pharyngeal -- Pharyngeal- Multi-consistency -- Pharyngeal -- Pharyngeal- Pill -- Pharyngeal -- Pharyngeal Comment --  CHL IP CERVICAL ESOPHAGEAL PHASE 03/14/2020 Cervical Esophageal Phase Impaired Pudding Teaspoon -- Pudding Cup -- Honey Teaspoon -- Honey Cup -- Nectar Teaspoon -- Nectar Cup Esophageal backflow into the pharynx;Reduced cricopharyngeal relaxation Nectar Straw -- Thin Teaspoon -- Thin Cup Esophageal backflow into the pharynx;Reduced cricopharyngeal relaxation;Esophageal backflow into the larynx Thin Straw -- Puree Reduced cricopharyngeal relaxation Mechanical Soft -- Regular -- Multi-consistency -- Pill -- Cervical Esophageal Comment -- Sonia Baller, MA, CCC-SLP Speech Therapy              ASSESSMENT AND PLAN: 1.Laryngeal cancer -07/08/2019 CT of the neck showed enhancing hypopharyngeal soft tissue suspicious for malignancy -07/09/2019 tracheostomy placement and biopsy, tracheostomy decannulation 08/26/2019 -07/09/2019 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 No cervical lymphadenopathy.  2. Left lower lobe of the lung mass concerning for primary neoplasm versus metastatic disease -07/07/2019 CT angiogram of the chest 2.7 cm mass in the left lower lobe of the  lung -CT 08/17/2019-persistent low-attenuation lesion in the superior segment of left lower lobe with surrounding consolidation/collapse, stable from 07/07/2019, consolidation and groundglass opacity in the left lower lobe have largely resolved -08/20/2019 bronchoscopy-biopsy nondiagnostic -CT chest 11/02/2019-, increased in size concerning for a primary lung malignancy versus a metastasis versus pulmonary abscess, improved left lower lobe aeration, COPD             -PET scan 11/26/2019-3.7 cm left lower lobe pulmonary mass, centrally necrotic and markedly hypermetabolic.  Second tiny 7 mm irregular nodular opacity in the superior segment left lower lobe shows low-level FDG accumulation.  Low-level uptake in an enlarged AP  window lymph node.  Mild hypermetabolism in a normal-appearing left axillary lymph node, felt to be reactive.  Bony uptake identified in the pelvis without underlying lesions evident by CT.           -CT biopsy of left lung mass 02/11/2020-squamous cell carcinoma; PDL1 TPS 0%  -CT angio chest 03/10/20-negative acute PE. Interval development dense consolidation and associated volume loss within the LLL. LLL bronchus completely obscured and likely impacted with debris. At least 3 enlarging pulmonary nodules in the right upper and lower lobes.   -Neck CT 03/14/20-circumferential mucosal thickening supraglottic larynx with narrowing of transverse dimension of airway to 3 mm. No cervical lymphadenopathy.   -CT chest 03/14/20-dense patchy consolidation and volume loss throughout the lingula and LLL with scattered air bronchograms, obscuring LLL mass seen on 11/02/2019 chest CT. Multiple solid pulmonary nodules scattered throughout right lung all new or increased since 11/02/2019. New mild AP window adenopathy.  -SBRT left lung mass 03/02/2020, 03/08/2020, 03/14/2020, 03/15/2020 3. Cachexia 4. History of aspiration pneumonia 5. Mild anemia 6. Atrial fibrillation  with RVR 6. COPD 8. Alcohol abuse 9. Tobacco dependence 10. Cocaine abuse 11. Odynophagia secondary to radiation-improved 12.  Hospital admission 03/10/2020-sepsis secondary to UTI, pneumonia   Recommendations: 1.  Antibiotics per hospitalist for treatment of UTI and pneumonia. 2.  Continue radiation under the care of Dr. Larita Fife discontinuing radiation based on evidence of progressive disease in the right lung 3. continue Decadron, airway management per ENT and pulmonary medicine 4. Continue discussions with patient and family regarding goals of care and CODE STATUS   LOS: 6 days   Ned Card, AGPCNP-BC 03/16/20 Mr. Fiorella was interviewed and examined. He developed increased respiratory distress with stridor over the past few days. This has improved with Decadron. He has been evaluated by ENT and pulmonary medicine. Dr. Constance Holster does not recommend a tracheostomy at present, but notes this may be required in the future.  Mr. Turnbaugh has evidence of metastatic disease in the right lung. There is fullness in the upper airway which could represent radiation necrosis or progressive laryngeal cancer. It is unclear whether the right-sided lung nodules are related to head neck cancer or non-small cell lung cancer. In either case no therapy will be curative.  Mr. Fann has a poor performance status. He is not a good candidate for immunotherapy based on the low PD1 score on the lung biopsy. He is not a candidate for chemotherapy in his current condition. I recommend hospice care. We discussed CPR and ACLS issues. He requested I contact his sister to discuss the case.  I contacted his sister, Nazire Fruth, by telephone at approximately 2:15 PM. I reviewed his entire case. I explained the poor prognosis and recommendation for comfort care. She will have further discussions with Mr. Copenhaver.  I will see Mr. Brightwell again on 03/17/2020. I will contact Dr. Isidore Moos regarding the recent developments  in his case.

## 2020-03-16 NOTE — NC FL2 (Signed)
[place] LEVEL OF CARE SCREENING TOOL     IDENTIFICATION  Patient Name: Devon Peterson Birthdate: 1955-05-14 Sex: male Admission Date (Current Location): 03/10/2020  [facility] and [place] Number:  Herbalist and Address:  [facility],  [place] [address] [place]      Provider Number: [id number]  Attending Physician Name and Address:  Shelly Coss, MD  Relative Name and Phone Number:  Devon Peterson [phone]    Current Level of Care: Hospital Recommended Level of Care: [place] Prior Approval Number:    Date Approved/Denied:   PASRR Number: [id number]  Discharge Plan: SNF    Current Diagnoses: Patient Active Problem List   Diagnosis Date Noted  . Hypoxia   . Acute cystitis without hematuria   . 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 Weakness   . Malignant neoplasm of lower lobe of left lung ([place]) 02/19/2020  . Acute respiratory insufficiency   . Squamous cell carcinoma of neck   . Palliative care encounter   . Tracheostomy care ([place])   . 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 Lung mass   . Neck mass   . Pressure injury of skin 07/08/2019  . Aspiration pneumonia ([place]) 07/07/2019  . Dysphagia 07/07/2019  . Severe protein-calorie malnutrition ([place]) 07/07/2019  . Alcohol use 07/07/2019  . Sepsis ([place]) 04/17/2019  . COPD exacerbation ([place])   . Lactic acidosis   . Tobacco abuse   . Alcohol abuse     Orientation RESPIRATION BLADDER Height & Weight     Self,Place,Time,Situation  O2,Tracheostomy Incontinent Weight: 49.8 kg Height:  5\' 11"  (180.3 cm)  BEHAVIORAL SYMPTOMS/MOOD NEUROLOGICAL BOWEL NUTRITION STATUS      Incontinent Feeding tube (PEG-TF osmolite)  AMBULATORY STATUS COMMUNICATION OF NEEDS Skin   Limited Assist Verbally Surgical wounds (trach stoma,PEG-abd incision site.)                       Personal Care Assistance Level of Assistance  Bathing,Feeding,Dressing Bathing  Assistance: Limited assistance Feeding assistance: Limited assistance Dressing Assistance: Limited assistance     Functional Limitations Info  Sight,Hearing,Speech Sight Info: Adequate Hearing Info: Adequate Speech Info: Impaired (Trach-stoma, PEG tube-osmolite TF.)    SPECIAL CARE FACTORS FREQUENCY  PT (By licensed PT),OT (By licensed OT),Speech therapy     PT Frequency: 5x week OT Frequency: 5x week     Speech Therapy Frequency: 3x week      Contractures Contractures Info: Not present    Additional Factors Info  Code Status,Allergies,Suctioning Needs Code Status Info:  (Full code) Allergies Info:  (Albumin(Human))       Suctioning Needs:  (trach stoma site-prn)   Current Medications (03/16/2020):  This is the current hospital active medication list Current Facility-Administered Medications  Medication Dose Route Frequency Provider Last Rate Last Admin  . acetaminophen (TYLENOL) tablet 650 mg  650 mg Oral Q6H PRN Marylyn Ishihara, Tyrone A, DO       Or  . acetaminophen (TYLENOL) suppository 650 mg  650 mg Rectal Q6H PRN Marylyn Ishihara, Tyrone A, DO      . albuterol (PROVENTIL) (2.5 MG/3ML) 0.083% nebulizer solution 2.5 mg  2.5 mg Nebulization Q2H PRN Shelly Coss, MD   2.5 mg at 03/14/20 1342  . amoxicillin-clavulanate (AUGMENTIN) 875-125 MG per tablet 1 tablet  1 tablet Oral Q12H Adhikari, Amrit, MD      . chlorhexidine (PERIDEX) 0.12 % solution 15 mL  15 mL Mouth  Rinse BID Florencia Reasons, MD   15 mL at 03/16/20 1040  . dexamethasone (DECADRON) injection 4 mg  4 mg Intravenous Q6H Brand Males, MD   4 mg at 03/16/20 0541  . enoxaparin (LOVENOX) injection 40 mg  40 mg Subcutaneous Q24H Kyle, Tyrone A, DO   40 mg at 03/16/20 1018  . feeding supplement (OSMOLITE 1.5 CAL) liquid 1,000 mL  1,000 mL Per Tube Q24H Florencia Reasons, MD 50 mL/hr at 03/15/20 1338 1,000 mL at 03/15/20 1338  . feeding supplement (PROSource TF) liquid 45 mL  45 mL Per Tube Daily Florencia Reasons, MD   45 mL at 03/16/20 1018  . free  water 200 mL  200 mL Per Tube Q4H Florencia Reasons, MD   200 mL at 03/16/20 1000  . insulin aspart (novoLOG) injection 0-15 Units  0-15 Units Subcutaneous Q4H Brand Males, MD   3 Units at 03/16/20 0512  . Ipratropium-Albuterol (COMBIVENT) respimat 1 puff  1 puff Inhalation BID Florencia Reasons, MD   1 puff at 03/16/20 0758  . MEDLINE mouth rinse  15 mL Mouth Rinse q12n4p Florencia Reasons, MD   15 mL at 03/14/20 1715  . nystatin (MYCOSTATIN) 100000 UNIT/ML suspension 500,000 Units  5 mL Oral QID PRN Cherylann Ratel A, DO      . nystatin (MYCOSTATIN) 100000 UNIT/ML suspension 500,000 Units  5 mL Oral QID Florencia Reasons, MD   500,000 Units at 03/16/20 1018  . oxyCODONE (Oxy IR/ROXICODONE) immediate release tablet 5 mg  5 mg Oral Q8H PRN Marylyn Ishihara, Tyrone A, DO   5 mg at 03/16/20 0541  . senna (SENOKOT) tablet 17.2 mg  2 tablet Oral Daily Kyle, Tyrone A, DO   17.2 mg at 03/16/20 1018  . thiamine tablet 100 mg  100 mg Oral Daily Kyle, Tyrone A, DO   100 mg at 03/16/20 1018     Discharge Medications: Please see discharge summary for a list of discharge medications.  Relevant Imaging Results:  Relevant Lab Results:   Additional Information [phone]  Dessa Phi, RN

## 2020-03-16 NOTE — Progress Notes (Signed)
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 fib, aspiration pneumonia who was sent from skilled nursing facility for the evaluation of shortness of breath, cough, increased sputum production, fever and hypoxia.  CT imaging on presentation did not show any PE but showed left lower lobe pneumonia, debris in the left lower lobe bronchus.  Also showed new metastatic lung lesions on the right upper and lower lobes.  UA was also concerning for UTI.  Hospital course remarkable for electrolyte abnormalities.  Palliative care also consulted for goals of care.  Remains full code.  On 03/14/2020, he was found to have a stridor which has improved after starting on decadron.  ENT consulted and the plan is for tracheostomy.    Assessment & Plan:   Active Problems:   Sepsis ([place])   Palliative care encounter   Hypoxia   Acute cystitis without hematuria   Squamous cell carcinoma of lung (HCC)   Weakness   Sepsis secondary  lobar pneumonia: Suspected aspiration pneumonia.  Debris seen on the left lower lobe bronchus.  COVID screen negative.  Cultures have not shown any growth.  Speech therapy following.Plan for modified barium swallow.  Currently on Rocephin and Flagyl,day 7.  Currently afebrile.  Lactic acidosis, leukocytosis resolved.  We will change antibiotic to Augmentin to complete total abx course of 10 days.  Acute hypoxic respiratory failure: Secondary to pneumonia.  Continue current  antibiotics.  Continue to wean the oxygen. He  is on 3-4 L of oxygen per minute . CXR on 03/15/19 showed Worsening collapse/consolidation  left lower  lobe due to worsening mucous plugging.CXR also showed large pleural effusion,ordered US guided thoracentesis but was not found to have significant fluid as per ultrasound.  PCCM were following,now signed off. CT chest on 03/15/19 showed patchy debris throughout the left lung airways,dense patchy consolidation and volume loss throughout the lingula and left lower lobe with scattered air bronchograms.Findings suggesting  combination of aspiration, pneumonia, atelectasis, evolving postradiation change and/or residual tumor.Also showed  solid pulmonary nodules scattered throughout the right lung.  Stridor/History of laryngeal cancer/dysphagia: Status post trach but decannulated on 6/21.  Developed dysphagia from radiation therapy.  Status post PEG.  Currently NPO and on tube feeding.  Speech is following.  CT soft tissue neck  showed circumferential mucosal thickening of the supraglottic larynx with narrowing of the transverse dimension of the airway to 3 mm,Laryngeal thickening has markedly worsened compared to 11/02/2019. New/recurrent tumor or post radiation change is a possibility. ENT following.  Plan for  Tracheostomy as per [name]. Stridor has improved with decadron.  History of COPD: No wheezing.  Continue bronchodilators as needed  Hypernatremia:Suspected to be from dehydration. Resolved  Severe hypokalemia/hypomagnesemia: Continue supplementation and monitoring  Paroxysmal A. fib: Currently rate is controlled.  Not on anticoagulation.  On metoprolol.  Currently in normal sinus rhythm.  Severe protein calorie malnutrition: BMI of 15.31.  On tube feeding diet  Recently diagnosed right squamous cell lung cancer: Follows with Dr. Learta Codding.  CT imaging concerning for metastatic disease.  Will recommend outpatient follow-up with oncology.On radiation therapy  Oral thrush: On nystatin  Normocytic anemia: Most likely secondary  to malignancy.  Hemoglobin currently stable  Goals of care:  palliative  care had been following during this admission.  Goals of care discussed.  Plan is to continue full code and full scope of treatment for now.  PT/OT recommending skilled nursing facility on discharge.    Nutrition Problem: Inadequate oral intake Etiology: inability to eat      DVT prophylaxis:Lovnox Code Status: Full Family Communication: Sister on phone on 03/15/20 Status is: Inpatient  Remains inpatient appropriate because:Inpatient level of care appropriate due to severity of illness   Dispo: The patient is from: SNF              Anticipated d/c is to: SNF              Anticipated d/c date is: 2-3 days              Patient currently is not medically stable to d/c. Waiting for tracheostomy   Consultants: palliative care  Procedures: None  Antimicrobials:  Anti-infectives (From admission, onward)   Start     Dose/Rate Route Frequency Ordered Stop   03/13/20 2000  cefTRIAXone (ROCEPHIN) 1 g in sodium chloride 0.9 % 100 mL IVPB        1 g 200 mL/hr over 30 Minutes Intravenous Every 24 hours 03/13/20 1502     03/11/20 0800  ceFEPIme (MAXIPIME) 2 g in sodium chloride 0.9 % 100 mL IVPB  Status:  Discontinued        2 g 200 mL/hr over 30 Minutes Intravenous Every 8 hours 03/11/20 0732 03/13/20 1501   03/11/20 0200  metroNIDAZOLE (FLAGYL) IVPB 500 mg        500 mg 100 mL/hr over 60 Minutes Intravenous Every 8 hours 03/11/20 0125     03/10/20 2200  ceFEPIme (MAXIPIME) 2 g in sodium chloride 0.9 % 100 mL IVPB  Status:  Discontinued        2 g 200 mL/hr over 30 Minutes Intravenous Every 12 hours 03/10/20 1941 03/11/20 0732   03/10/20 2200  vancomycin (VANCOCIN) IVPB 1000 mg/200 mL premix  Status:  Discontinued        1,000 mg 200 mL/hr over 60 Minutes Intravenous Every 24 hours 03/10/20 1941 03/11/20 0801   03/10/20 1115  metroNIDAZOLE (FLAGYL) IVPB 500 mg        500 mg 100 mL/hr over 60 Minutes Intravenous  Once 03/10/20 1105 03/10/20 1456   03/10/20 1100  ceFEPIme  (MAXIPIME) 2 g in sodium chloride 0.9 % 100 mL IVPB        2 g 200 mL/hr over 30 Minutes Intravenous  Once 03/10/20 1058 03/10/20 1247   03/10/20 1100  vancomycin (VANCOCIN) IVPB 1000 mg/200 mL premix        1,000 mg 200 mL/hr over 60 Minutes Intravenous  Once 03/10/20 1058 03/10/20 1331   03/10/20 1100  piperacillin-tazobactam (ZOSYN) IVPB 3.375 g  Status:  Discontinued        3.375 g 12.5 mL/hr over 240 Minutes Intravenous  Once 03/10/20 1058 03/10/20 1105      Subjective:  Patient seen and examined at bedside this morning.  Hemodynamically stable.  Stridor has significantly improved.  Respiratory status has improved.  Very weak and mostly bedbound.  Denies any new complaints  Objective: Vitals:   03/15/20 1229 03/15/20 1937 03/15/20 2018 03/16/20 0434  BP: 109/78  95/66 107/74  Pulse: 98  93 98  Resp: _0 Temp: 98.2 F (36.8 C)  98.2  F (36.8 C) 97.6 F (36.4 C)  TempSrc: Oral  Oral Oral  SpO2: 96% 97%  96%  Weight:      Height:        Intake/Output Summary (Last 24 hours) at 03/16/2020 0754 Last data filed at 03/15/2020 2200 Gross per 24 hour  Intake 0 ml  Output 2400 ml  Net -2400 ml   Filed Weights   03/10/20 1042 03/12/20 1748  Weight: 50 kg 49.8 kg    Examination:    General exam: Deconditioned, debilitated, chronically looking HEENT: Tracheostomy scar Respiratory system: Bilateral diminished air sounds but no wheezes or crackles  Cardiovascular system: S1 & S2 heard, RRR. No JVD, murmurs, rubs, gallops or clicks. Gastrointestinal system: Abdomen is nondistended, soft and nontender. No organomegaly or masses felt. Normal bowel sounds heard. Central nervous system: Alert and oriented. No focal neurological deficits. Extremities: No edema, no clubbing ,no cyanosis Skin: No rashes, lesions or ulcers,no icterus ,no pallor    Data Reviewed: I have personally reviewed following labs and imaging studies  CBC: Recent Labs  Lab 03/10/20 1007  03/12/20 0348 03/16/20 0547  WBC 12.5* 7.2 14.1*  NEUTROABS  --  6.0 13.4*  HGB 11.9* 10.8* 9.2*  HCT 39.2 36.0* 29.0*  MCV 77.6* 79.3* 74.6*  PLT 350 270 831   Basic Metabolic Panel: Recent Labs  Lab 03/12/20 0348 03/13/20 0558 03/14/20 0535 03/14/20 1538 03/15/20 0543 03/16/20 0547  NA 154* 150* 147*  --  145 142  K 3.6 2.6* 2.8* 3.3* 3.6 3.3*  CL 118* 114* 110  --  107 101  CO2 _0 --  28 28  GLUCOSE 138* 128* 168*  --  178* 189*  BUN _1 --  11 16  CREATININE 0.70 0.65 0.68  --  0.61 0.57*  CALCIUM 8.7* 8.2* 8.0*  --  8.5* 8.2*  MG 1.9 1.7 1.5*  --  1.7  --    GFR: Estimated Creatinine Clearance: 65.7 mL/min (A) (by C-G formula based on SCr of 0.57 mg/dL (L)). Liver Function Tests: Recent Labs  Lab 03/10/20 1055  AST 28  ALT 18  ALKPHOS 108  BILITOT 0.4  PROT 9.7*  ALBUMIN 3.3*   No results for input(s): LIPASE, AMYLASE in the last 168 hours. No results for input(s): AMMONIA in the last 168 hours. Coagulation Profile: Recent Labs  Lab 03/10/20 1055 03/11/20 0337  INR 1.1 1.1   Cardiac Enzymes: No results for input(s): CKTOTAL, CKMB, CKMBINDEX, TROPONINI in the last 168 hours. BNP (last 3 results) No results for input(s): PROBNP in the last 8760 hours. HbA1C: Recent Labs    03/14/20 1537  HGBA1C 6.3*   CBG: Recent Labs  Lab 03/15/20 1641 03/15/20 2014 03/16/20 0041 03/16/20 0439 03/16/20 0733  GLUCAP 152* 198* 196* 174* 99   Lipid Profile: No results for input(s): CHOL, HDL, LDLCALC, TRIG, CHOLHDL, LDLDIRECT in the last 72 hours. Thyroid Function Tests: No results for input(s): TSH, T4TOTAL, FREET4, T3FREE, THYROIDAB in the last 72 hours. Anemia Panel: No results for input(s): VITAMINB12, FOLATE, FERRITIN, TIBC, IRON, RETICCTPCT in the last 72 hours. Sepsis Labs: Recent Labs  Lab 03/10/20 1058 03/10/20 1352 03/11/20 0508 03/12/20 0348  PROCALCITON  --   --  0.57  --   LATICACIDVEN 2.5* 2.1*  --  1.4    Recent  Results (from the past 240 hour(s))  Blood Culture (routine x 2)     Status: None   Collection Time: 03/10/20 12:15  PM   Specimen: BLOOD LEFT FOREARM  Result Value Ref Range Status   Specimen Description BLOOD LEFT FOREARM  Final   Special Requests   Final    BOTTLES DRAWN AEROBIC AND ANAEROBIC Blood Culture adequate volume   Culture   Final    NO GROWTH 5 DAYS Performed at [facility], [address], [place], [place] [address]    Report Status 03/15/2020 FINAL  Final  Blood Culture (routine x 2)     Status: None   Collection Time: 03/10/20 12:15 PM   Specimen: BLOOD RIGHT FOREARM  Result Value Ref Range Status   Specimen Description   Final    BLOOD RIGHT FOREARM Performed at [facility], [place] [address], [place], [place] [address]    Special Requests   Final    BOTTLES DRAWN AEROBIC AND ANAEROBIC Blood Culture results may not be optimal due to an inadequate volume of blood received in culture bottles Performed at [facility] [address], [place], [place] [address]    Culture   Final    NO GROWTH 5 DAYS Performed at [facility], [place] [address], [place], [place] [address]    Report Status 03/15/2020 FINAL  Final  Resp Panel by RT-PCR (Flu A&B, Covid) Nasopharyngeal Swab     Status: None   Collection Time: 03/10/20  1:00 PM   Specimen: Nasopharyngeal Swab; Nasopharyngeal(NP) swabs in vial transport medium  Result Value Ref Range Status   SARS Coronavirus 2 by RT PCR NEGATIVE NEGATIVE Final    Comment: (NOTE) SARS-CoV-2 target nucleic acids are NOT DETECTED.  The SARS-CoV-2 RNA is generally detectable in upper respiratory specimens during the acute phase of infection. The lowest concentration of SARS-CoV-2 viral copies this assay can detect is 138 copies/mL. A negative result does not preclude SARS-Cov-2 infection and should not be used as the sole basis for treatment or other patient management decisions. A negative result may  occur with  improper specimen collection/handling, submission of specimen other than nasopharyngeal swab, presence of viral mutation(s) within the areas targeted by this assay, and inadequate number of viral copies(<138 copies/mL). A negative result must be combined with clinical observations, patient history, and epidemiological information. The expected result is Negative.  Fact Sheet for Patients:  EntrepreneurPulse.com.au  Fact Sheet for Healthcare Providers:  IncredibleEmployment.be  This test is no t yet approved or cleared by the [place] FDA and  has been authorized for detection and/or diagnosis of SARS-CoV-2 by FDA under an Emergency Use Authorization (EUA). This EUA will remain  in effect (meaning this test can be used) for the duration of the COVID-19 declaration under Section 564(b)(1) of the Act, 21 U.S.C.section 360bbb-3(b)(1), unless the authorization is terminated  or revoked sooner.       Influenza A by PCR NEGATIVE NEGATIVE Final   Influenza B by PCR NEGATIVE NEGATIVE Final    Comment: (NOTE) The Xpert Xpress SARS-CoV-2/FLU/RSV plus assay is intended as an aid in the diagnosis of influenza from Nasopharyngeal swab specimens and should not be used as a sole basis for treatment. Nasal washings and aspirates are unacceptable for Xpert Xpress SARS-CoV-2/FLU/RSV testing.  Fact Sheet for Patients: EntrepreneurPulse.com.au  Fact Sheet for Healthcare Providers: IncredibleEmployment.be  This test is not yet approved or cleared by the [place] FDA and has been authorized for detection and/or diagnosis of SARS-CoV-2 by FDA under an Emergency Use Authorization (EUA). This EUA will remain in effect (meaning this test  can be used) for the duration of the COVID-19 declaration under Section 564(b)(1) of the Act, 21 U.S.C. section 360bbb-3(b)(1), unless the authorization is terminated  or revoked.  Performed at [facility], [place] [address], [place], [place] [address]   Urine culture     Status: None   Collection Time: 03/10/20  2:00 PM   Specimen: In/Out Cath Urine  Result Value Ref Range Status   Specimen Description   Final    IN/OUT CATH URINE Performed at [facility] [address], [place], [place] [address]    Special Requests   Final    NONE Performed at [facility], [place] [address] [place], [place] [address]    Culture   Final    NO GROWTH Performed at [facility], [place] [address], [place], [place] [address]    Report Status 03/12/2020 FINAL  Final  MRSA PCR Screening     Status: None   Collection Time: 03/10/20  8:38 PM   Specimen: Nasopharyngeal  Result Value Ref Range Status   MRSA by PCR NEGATIVE NEGATIVE Final    Comment:        The GeneXpert MRSA Assay (FDA approved for NASAL specimens only), is one component of a comprehensive MRSA colonization surveillance program. It is not intended to diagnose MRSA infection nor to guide or monitor treatment for MRSA infections. Performed at [facility], [place] [address] [place], [place] [address]          Radiology Studies: CT SOFT TISSUE NECK WO CONTRAST  Result Date: 03/14/2020 CLINICAL DATA:  Stridor.  Head and neck cancer. EXAM: CT NECK WITHOUT CONTRAST TECHNIQUE: Multidetector CT imaging of the neck was performed following the standard protocol without intravenous contrast. COMPARISON:  CT neck 11/02/2019 Head CT 11/26/2019 FINDINGS: Pharynx and larynx: Lack of intravenous contrast agent and marked motion at the level of the epiglottis and larynx limits study. Within that limitation, there is circumferential mucosal thickening of the supraglottic larynx with narrowing of the transverse dimension of the airway to 3 mm. There is no retropharyngeal collection. The pharynx and oral cavity are unremarkable.  Salivary glands: No inflammation, mass, or stone. Thyroid: Normal. Lymph nodes: None enlarged or abnormal density. Vascular: Atherosclerotic calcification of the internal carotid arteries. Limited intracranial: Negative. Visualized orbits: Negative. Mastoids and visualized paranasal sinuses: Clear. Skeleton: No acute or aggressive process. Upper chest: Please see dedicated report for concomitant chest CT. Other: None. IMPRESSION: 1. Lack of intravenous contrast agent and marked motion at the level of the epiglottis and larynx limits study. 2. Circumferential mucosal thickening of the supraglottic larynx with narrowing of the transverse dimension of the airway to 3 mm. Laryngeal thickening is markedly worsened compared to 11/02/2019. New/recurrent tumor or post radiation change could both account for this appearance. 3. No cervical lymphadenopathy. 4. Please see report for concomitant CT of the chest for discussion of left lung findings. Aortic Atherosclerosis (ICD10-I70.0). Electronically Signed   By: Ulyses Jarred M.D.   On: 03/14/2020 20:37   CT CHEST WO CONTRAST  Result Date: 03/15/2020 CLINICAL DATA:  Inpatient. 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 EXAM: CT CHEST WITHOUT CONTRAST TECHNIQUE: Multidetector CT imaging of the chest was performed following the standard protocol without IV contrast. COMPARISON:  03/10/2020 chest CT angiogram. 11/26/2019 PET-CT. 11/02/2027 chest CT. FINDINGS: Cardiovascular: Normal heart size. No significant pericardial effusion/thickening. Left anterior  descending coronary atherosclerosis. Great vessels are normal in course and caliber. Mediastinum/Nodes: No discrete thyroid nodules. Mildly patulous thoracic esophagus containing a small amount of oral contrast. No axillary adenopathy. Mild AP window adenopathy up to 1.1 cm (series 1/image 64), new. No discrete hilar  adenopathy on these noncontrast images. Lungs/Pleura: No pneumothorax. Small dependent left pleural effusion. No right pleural effusion. Moderate centrilobular and paraseptal emphysema with mild diffuse bronchial wall thickening. Patchy debris throughout left lung airways. Dense patchy consolidation and volume loss throughout the lingula and left lower lobe with scattered air bronchograms, similar to 03/10/2020 chest CT angiogram study, obscuring the left lower lobe mass seen on 11/02/2019 chest CT. Multiple (at least 5) solid pulmonary nodules scattered throughout the right lung, all new or increased since 11/02/2019 chest CT and unchanged since recent 03/10/2020 chest CT angiogram study. Representative 10 mm right upper lobe nodule (series 5/image 55), increased from 3 mm on 11/02/2019 CT. Representative anterior right lower lobe 8 mm nodule (series 5/image 107), new. Upper abdomen: No acute abnormality. Musculoskeletal: No aggressive appearing focal osseous lesions. Mild thoracic spondylosis. IMPRESSION: 1. Patchy debris throughout the left lung airways. Dense patchy consolidation and volume loss throughout the lingula and left lower lobe with scattered air bronchograms, similar to recent 03/11/2019 chest CT angiogram study, obscuring the left lower lobe mass seen on 11/02/2019 chest CT. Findings could represent any combination of aspiration, pneumonia, atelectasis, evolving postradiation change and/or residual tumor. 2. Multiple (at least 5) solid pulmonary nodules scattered throughout the right lung, all new or increased since 11/02/2019 chest CT and unchanged since recent 03/11/2019 chest CT. 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 3. New mild AP window adenopathy, nonspecific, cannot exclude nodal metastasis versus reactive etiology. Attention on follow-up chest CT advised. 4. Small dependent left pleural effusion. 5. One vessel coronary atherosclerosis. 6. Aortic Atherosclerosis  (ICD10-I70.0) and Emphysema (ICD10-J43.9). Electronically Signed   By: Ilona Sorrel M.D.   On: 03/15/2020 09:19   [place] CHEST (PLEURAL EFFUSION)  Result Date: 03/15/2020 CLINICAL DATA:  65 year old male referred for possible thoracentesis EXAM: CHEST ULTRASOUND COMPARISON:  CT 03/14/2020 FINDINGS: Ultrasound performed demonstrating scant pleural fluid on the left, with no pleural fluid on the right. Thoracentesis not performed at this time. IMPRESSION: Scant left-sided pleural fluid with no adequate window for aspiration. Electronically Signed   By: Corrie Mckusick D.O.   On: 03/15/2020 12:26   DG CHEST PORT 1 VIEW  Result Date: 03/14/2020 CLINICAL DATA:  Shortness of breath. EXAM: PORTABLE CHEST 1 VIEW COMPARISON:  CT chest and chest radiograph 03/10/2020. FINDINGS: Leftward shift of the heart mediastinum. Increasing collapse/consolidation in the left upper and left lower lobes. Large left pleural effusion. Apparent cut off of the left mainstem bronchus. Right lung is clear. Retained oral contrast is seen in the esophagus and stomach. IMPRESSION: Worsening collapse/consolidation in the lingula and left lower lobe with leftward shift of the heart and mediastinum, findings indicative of atelectasis/pneumonia due to worsening mucous plugging. Aspiration is not excluded. Electronically Signed   By: Lorin Picket M.D.   On: 03/14/2020 15:39   DG Swallowing Func-Speech Pathology  Result Date: 03/14/2020 Objective Swallowing Evaluation: Type of Study: MBS-Modified Barium Swallow Study  Patient Details Name: Croix Presley MRN: [id number] Date of Birth: [birth year] Today's Date: 03/14/2020 Time: SLP Start Time (ACUTE ONLY): 1450 -SLP Stop Time (ACUTE ONLY): 1505 SLP Time Calculation (min) (ACUTE ONLY): 15 min Past Medical History: Past Medical History: Diagnosis Date . Cancer ([place])  . COPD (chronic obstructive  pulmonary disease) ([place])  . Medical history non-contributory  Past Surgical History: Past Surgical History:  Procedure Laterality Date . BIOPSY  08/20/2019  Procedure: BIOPSY;  Surgeon: Chesley Mires, MD;  Location: [facility];  Service: Endoscopy;; . BRONCHIAL WASHINGS  08/20/2019  Procedure: BRONCHIAL WASHINGS;  Surgeon: Chesley Mires, MD;  Location: [facility];  Service: Endoscopy;;  LLL BAL . DIRECT LARYNGOSCOPY N/A 07/09/2019  Procedure: DIRECT LARYNGOSCOPY WITH BIOPSY;  Surgeon: Jerrell Belfast, MD;  Location: [facility];  Service: ENT;  Laterality: N/A; . IR FLUORO RM 30-60 MIN  07/13/2019 . IR GASTROSTOMY TUBE MOD SED  07/14/2019 . IR REPLACE G-TUBE SIMPLE WO FLUORO  10/09/2019 . NO PAST SURGERIES   . TRACHEOSTOMY TUBE PLACEMENT N/A 07/09/2019  Procedure: TRACHEOSTOMY;  Surgeon: Jerrell Belfast, MD;  Location: [facility];  Service: ENT;  Laterality: N/A; . VIDEO BRONCHOSCOPY Left 08/20/2019  Procedure: VIDEO BRONCHOSCOPY WITH FLUORO;  Surgeon: Chesley Mires, MD;  Location: [facility];  Service: Endoscopy;  Laterality: Left; HPI: Patient is a 65 y.o. male with PMH: metastatic lung cancer (just started round of XRT), COPD currently at [facility], who was found to be dyspneic and hypoxic and placed on 2L [place] and improved. He has had a couple days history of cough, increased sputnum producdtion and fevers so he was brought to ED.  CXR revealed Worsening collapse/consolidation in the lingula and left lower lobewith leftward shift of the heart and mediastinum, findings indicative of atelectasis/pneumonia due to worsening mucous plugging. Aspiration is not excluded, COVID negative. Patient was seen by speech therapy during previous admission in June of 2021 for dysphagia.  Subjective: pleasant, cooperative Assessment / Plan / Recommendation CHL IP CLINICAL IMPRESSIONS 03/14/2020 Clinical Impression Patient presents with a moderate oral and mod-severe pharyngeal dysphagia. As compared to previous MBS in June of 2020, swallow function has improved. He exhibited moderate vallecular residuals, mild pyriform and posterior pharyngeal wall  residuals with puree solids but chin tuck did help clear this. Thin liquids and nectar thick liquids both resulted in diffuse residuals in pharynx, frequent penetration during and after swallow. In addition, esophageal backflow of boluses  were observed with thin/nectar liquids moving back into pharynx and with thin liquids, into laryngeal vestibule. Patient exhibited some belching and this was followed by him regurgitating a significant amount of barium mixed with secretions (this occured twice during study). Although aspiration was not observed, patient exhibited frequent penetration on residuals, which were never fully cleared from pharynx. In addition, esophageal backflow into pharynx and laryngeal vestibule also put patient at high risk of aspiration. SLP Visit Diagnosis -- Attention and concentration deficit following -- Frontal lobe and executive function deficit following -- Impact on safety and function --   [facility] IP TREATMENT RECOMMENDATION 03/14/2020 Treatment Recommendations Therapy as outlined in treatment plan below   Prognosis 03/14/2020 Prognosis for Safe Diet Advancement Fair Barriers to Reach Goals Time post onset;Severity of deficits Barriers/Prognosis Comment -- [facility] IP DIET RECOMMENDATION 03/14/2020 SLP Diet Recommendations NPO Liquid Administration via -- Medication Administration Via alternative means Compensations -- Postural Changes Seated upright at 90 degrees   [facility] IP OTHER RECOMMENDATIONS 03/14/2020 Recommended Consults -- Oral Care Recommendations Oral care QID Other Recommendations --   [facility] IP FOLLOW UP RECOMMENDATIONS 03/14/2020 Follow up Recommendations Skilled Nursing facility;24 hour supervision/assistance   [facility] IP FREQUENCY AND DURATION 03/14/2020 Speech Therapy Frequency (ACUTE ONLY) min 2x/week Treatment Duration 1 week      [facility] IP ORAL PHASE 03/14/2020 Oral Phase Impaired Oral - Pudding Teaspoon --  Oral - Pudding Cup -- Oral - Honey Teaspoon -- Oral - Honey Cup -- Oral - Nectar Teaspoon  -- Oral - Nectar Cup -- Oral - Nectar Straw -- Oral - Thin Teaspoon Reduced posterior propulsion;Delayed oral transit;Weak lingual manipulation Oral - Thin Cup Lingual pumping;Delayed oral transit;Reduced posterior propulsion;Weak lingual manipulation Oral - Thin Straw -- Oral - Puree Delayed oral transit;Piecemeal swallowing;Reduced posterior propulsion;Weak lingual manipulation;Lingual pumping Oral - Mech Soft -- Oral - Regular -- Oral - Multi-Consistency -- Oral - Pill -- Oral Phase - Comment --  [facility] IP PHARYNGEAL PHASE 03/14/2020 Pharyngeal Phase Impaired Pharyngeal- Pudding Teaspoon -- Pharyngeal -- Pharyngeal- Pudding Cup -- Pharyngeal -- Pharyngeal- Honey Teaspoon -- Pharyngeal -- Pharyngeal- Honey Cup -- Pharyngeal -- Pharyngeal- Nectar Teaspoon -- Pharyngeal -- Pharyngeal- Nectar Cup Delayed swallow initiation-vallecula;Reduced pharyngeal peristalsis;Reduced laryngeal elevation;Pharyngeal residue - valleculae;Pharyngeal residue - pyriform;Pharyngeal residue - posterior pharnyx;Penetration/Aspiration during swallow;Penetration/Apiration after swallow Pharyngeal Material enters airway, remains ABOVE vocal cords and not ejected out Pharyngeal- Nectar Straw -- Pharyngeal -- Pharyngeal- Thin Teaspoon Reduced airway/laryngeal closure;Delayed swallow initiation-vallecula;Reduced laryngeal elevation;Penetration/Apiration after swallow;Penetration/Aspiration during swallow;Pharyngeal residue - valleculae;Pharyngeal residue - pyriform;Pharyngeal residue - posterior pharnyx Pharyngeal Material enters airway, remains ABOVE vocal cords and not ejected out Pharyngeal- Thin Cup Delayed swallow initiation-vallecula;Reduced airway/laryngeal closure;Reduced laryngeal elevation;Penetration/Aspiration during swallow;Penetration/Apiration after swallow;Pharyngeal residue - valleculae;Pharyngeal residue - pyriform;Pharyngeal residue - posterior pharnyx Pharyngeal Material enters airway, remains ABOVE vocal cords and not ejected  out Pharyngeal- Thin Straw -- Pharyngeal -- Pharyngeal- Puree Delayed swallow initiation-vallecula;Pharyngeal residue - valleculae;Pharyngeal residue - pyriform;Pharyngeal residue - posterior pharnyx Pharyngeal -- Pharyngeal- Mechanical Soft -- Pharyngeal -- Pharyngeal- Regular -- Pharyngeal -- Pharyngeal- Multi-consistency -- Pharyngeal -- Pharyngeal- Pill -- Pharyngeal -- Pharyngeal Comment --  [facility] IP CERVICAL ESOPHAGEAL PHASE 03/14/2020 Cervical Esophageal Phase Impaired Pudding Teaspoon -- Pudding Cup -- Honey Teaspoon -- Honey Cup -- Nectar Teaspoon -- Nectar Cup Esophageal backflow into the pharynx;Reduced cricopharyngeal relaxation Nectar Straw -- Thin Teaspoon -- Thin Cup Esophageal backflow into the pharynx;Reduced cricopharyngeal relaxation;Esophageal backflow into the larynx Thin Straw -- Puree Reduced cricopharyngeal relaxation Mechanical Soft -- Regular -- Multi-consistency -- Pill -- Cervical Esophageal Comment -- Sonia Baller, MA, CCC-SLP Speech Therapy                  Scheduled Meds: . chlorhexidine  15 mL Mouth Rinse BID  . dexamethasone (DECADRON) injection  4 mg Intravenous Q6H  . enoxaparin (LOVENOX) injection  40 mg Subcutaneous Q24H  . feeding supplement (OSMOLITE 1.5 CAL)  1,000 mL Per Tube Q24H  . feeding supplement (PROSource TF)  45 mL Per Tube Daily  . free water  200 mL Per Tube Q4H  . insulin aspart  0-15 Units Subcutaneous Q4H  . Ipratropium-Albuterol  1 puff Inhalation BID  . mouth rinse  15 mL Mouth Rinse q12n4p  . nystatin  5 mL Oral QID  . senna  2 tablet Oral Daily  . thiamine  100 mg Oral Daily   Continuous Infusions: . sodium chloride 75 mL/hr at 03/15/20 2044  . cefTRIAXone (ROCEPHIN)  IV 1 g (03/15/20 2044)  . metronidazole 500 mg (03/16/20 0239)     LOS: 6 days    Time spent: 35 mins,More than 50% of that time was spent in counseling and/or coordination of care.      Shelly Coss, MD [facility] P1/02/2021, 7:54 AM

## 2020-03-16 NOTE — Progress Notes (Signed)
SLP Cancellation Note  Patient Details Name: Devon Peterson MRN: [id number] DOB: January 13, 1956   Cancelled treatment:       Reason Eval/Treat Not Completed: Other (comment) (pt at radiation for pallaitive treatment, note recommendation for comfort care per chart review)  Kathleen Lime, MS [place] Office [phone] Pager [phone]   [name] 03/16/2020, 6:25 PM

## 2020-03-16 NOTE — Progress Notes (Signed)
PEG tube clogged per change of shift report. This RN attempted to flush with 60cc syringe, unable to flush or withdraw any material. Located a male Foley adapter with 10cc syringe, was able to flush with this setup and clear the clog. Tubing appears to be discolored and feels "gummy" or thickened inside proximal to patient. Flushing well at this time. MD notified.  Coolidge Breeze, RN 03/16/2020

## 2020-03-17 ENCOUNTER — Ambulatory Visit
Admission: RE | Admit: 2020-03-17 | Discharge: 2020-03-17 | Disposition: A | Discharge status: Home / Self Care | Payer: Medicaid Other | Source: Ambulatory Visit | Attending: Radiation Oncology | Admitting: Radiation Oncology

## 2020-03-17 ENCOUNTER — Encounter (HOSPITAL_COMMUNITY)
Admission: EM | Disposition: A | Discharge status: Skilled Nursing Facility | Payer: Self-pay | Source: Skilled Nursing Facility | Attending: Internal Medicine

## 2020-03-17 DIAGNOSIS — C329 Malignant neoplasm of larynx, unspecified: Secondary | ICD-10-CM | POA: Diagnosis not present

## 2020-03-17 DIAGNOSIS — N390 Urinary tract infection, site not specified: Secondary | ICD-10-CM | POA: Diagnosis not present

## 2020-03-17 DIAGNOSIS — C7802 Secondary malignant neoplasm of left lung: Secondary | ICD-10-CM | POA: Diagnosis not present

## 2020-03-17 DIAGNOSIS — J189 Pneumonia, unspecified organism: Secondary | ICD-10-CM | POA: Diagnosis not present

## 2020-03-17 LAB — BASIC METABOLIC PANEL
Anion gap: 12 (ref 5–15)
BUN: 17 mg/dL (ref 8–23)
CO2: 28 mmol/L (ref 22–32)
Calcium: 8.5 mg/dL — ABNORMAL LOW (ref 8.9–10.3)
Chloride: 103 mmol/L (ref 98–111)
Creatinine, Ser: 0.59 mg/dL — ABNORMAL LOW (ref 0.61–1.24)
GFR, Estimated: >60 mL/min (ref >60)
Glucose, Bld: 202 mg/dL — ABNORMAL HIGH (ref 70–99)
Potassium: 3.8 mmol/L (ref 3.5–5.1)
Sodium: 143 mmol/L (ref 135–145)

## 2020-03-17 LAB — GLUCOSE, CAPILLARY
Glucose-Capillary: 111 mg/dL — ABNORMAL HIGH (ref 70–99)
Glucose-Capillary: 126 mg/dL — ABNORMAL HIGH (ref 70–99)
Glucose-Capillary: 158 mg/dL — ABNORMAL HIGH (ref 70–99)
Glucose-Capillary: 168 mg/dL — ABNORMAL HIGH (ref 70–99)
Glucose-Capillary: 170 mg/dL — ABNORMAL HIGH (ref 70–99)
Glucose-Capillary: 196 mg/dL — ABNORMAL HIGH (ref 70–99)

## 2020-03-17 SURGERY — CREATION, TRACHEOSTOMY
Anesthesia: General

## 2020-03-17 MED ORDER — JEVITY 1.5 CAL/FIBER PO LIQD
1000.0000 mL | ORAL | Status: DC
  Administered 2020-03-18: 1000 mL
  Filled 2020-03-17 (×2): qty 1000
  Filled 2020-03-17: qty 1185

## 2020-03-17 NOTE — Progress Notes (Signed)
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 fib, aspiration pneumonia who was sent from skilled nursing facility for the evaluation of shortness of breath, cough, increased sputum production, fever and hypoxia.  CT imaging on presentation did not show any PE but showed left lower lobe pneumonia, debris in the left lower lobe bronchus.  Also showed new metastatic lung lesions on the right upper and lower lobes.  UA was also concerning for UTI.  Hospital course remarkable for electrolyte abnormalities.  Palliative care also consulted for goals of care.  Remains full code.  On 03/14/2020, he was found to have a stridor which has improved after starting on decadron.  ENT consulted and the plan is for tracheostomy.    Assessment & Plan:   Active Problems:   Sepsis ([place])   Palliative care encounter   Hypoxia   Acute cystitis without hematuria   Squamous cell carcinoma of lung (HCC)   Weakness   Sepsis secondary  lobar pneumonia: Suspected aspiration pneumonia.  Debris seen on the left lower lobe bronchus.  COVID screen negative.  Cultures have not shown any growth.  Speech therapy following.Plan for modified barium swallow.  Finished  Rocephin and Flagyl,day 7.  Currently afebrile.  Lactic acidosis, leukocytosis resolved.  We changed  antibiotic to Augmentin to complete total abx course of 10 days.  Acute hypoxic respiratory failure: Secondary to pneumonia.  Continue current  antibiotics.  Continue to wean the oxygen. He  is on 3-4 L of oxygen per minute . CXR on 03/15/19 showed Worsening collapse/consolidation  left lower lobe  due to worsening mucous plugging.CXR also showed large pleural effusion,ordered US guided thoracentesis but was not found to have significant fluid as per ultrasound.  PCCM were following,now signed off. CT chest on 03/15/19 showed patchy debris throughout the left lung airways,dense patchy consolidation and volume loss throughout the lingula and left lower lobe with scattered air bronchograms.Findings suggesting  combination of aspiration, pneumonia, atelectasis, evolving postradiation change and/or residual tumor.Also showed  solid pulmonary nodules scattered throughout the right lung.  Stridor/History of laryngeal cancer/dysphagia: Status post trach but decannulated on 6/21.  Developed dysphagia from radiation therapy.  Status post PEG.  Currently NPO and on tube feeding.  Speech is following.  CT soft tissue neck  showed circumferential mucosal thickening of the supraglottic larynx with narrowing of the transverse dimension of the airway to 3 mm,Laryngeal thickening has markedly worsened compared to 11/02/2019. New/recurrent tumor or post radiation change is a possibility. ENT following.  Plan for  Tracheostomy as per [name]. Stridor has improved with decadron.  History of COPD: No wheezing.  Continue bronchodilators as needed  Hypernatremia:Suspected to be from dehydration. Resolved  Severe hypokalemia/hypomagnesemia: Continue supplementation and monitoring  Paroxysmal A. fib: Currently rate is controlled.  Not on anticoagulation.  On metoprolol.  Currently in normal sinus rhythm.  Severe protein calorie malnutrition: BMI of 15.31.  On tube feeding diet  Recently diagnosed right squamous cell lung cancer: Follows with Dr. Learta Codding.  CT imaging concerning for metastatic disease.  Will recommend outpatient follow-up with oncology.On radiation therapy  Oral thrush: On nystatin  Normocytic anemia: Most likely secondary  to malignancy.  Hemoglobin currently stable  Goals of care: palliative   care had been following during this admission.  Goals of care discussed.  Plan is to continue full code and full scope of treatment for now.  PT/OT recommending skilled nursing facility on discharge.    Nutrition Problem: Inadequate oral intake Etiology: inability to eat      DVT prophylaxis:Lovnox Code Status: Full Family Communication: Sister on phone on 03/15/20 Status is: Inpatient  Remains inpatient appropriate because:Inpatient level of care appropriate due to severity of illness   Dispo: The patient is from: SNF              Anticipated d/c is to: SNF              Anticipated d/c date is: 2-3 days              Patient currently is not medically stable to d/c. Waiting for tracheostomy   Consultants: palliative care  Procedures: None  Antimicrobials:  Anti-infectives (From admission, onward)   Start     Dose/Rate Route Frequency Ordered Stop   03/16/20 2200  amoxicillin-clavulanate (AUGMENTIN) 875-125 MG per tablet 1 tablet        1 tablet Oral Every 12 hours 03/16/20 1037 03/20/20 0959   03/13/20 2000  cefTRIAXone (ROCEPHIN) 1 g in sodium chloride 0.9 % 100 mL IVPB  Status:  Discontinued        1 g 200 mL/hr over 30 Minutes Intravenous Every 24 hours 03/13/20 1502 03/16/20 1037   03/11/20 0800  ceFEPIme (MAXIPIME) 2 g in sodium chloride 0.9 % 100 mL IVPB  Status:  Discontinued        2 g 200 mL/hr over 30 Minutes Intravenous Every 8 hours 03/11/20 0732 03/13/20 1501   03/11/20 0200  metroNIDAZOLE (FLAGYL) IVPB 500 mg  Status:  Discontinued        500 mg 100 mL/hr over 60 Minutes Intravenous Every 8 hours 03/11/20 0125 03/16/20 1037   03/10/20 2200  ceFEPIme (MAXIPIME) 2 g in sodium chloride 0.9 % 100 mL IVPB  Status:  Discontinued        2 g 200 mL/hr over 30 Minutes Intravenous Every 12 hours 03/10/20 1941 03/11/20 0732   03/10/20 2200  vancomycin (VANCOCIN) IVPB 1000 mg/200 mL premix  Status:  Discontinued        1,000 mg 200 mL/hr over 60 Minutes Intravenous  Every 24 hours 03/10/20 1941 03/11/20 0801   03/10/20 1115  metroNIDAZOLE (FLAGYL) IVPB 500 mg        500 mg 100 mL/hr over 60 Minutes Intravenous  Once 03/10/20 1105 03/10/20 1456   03/10/20 1100  ceFEPIme (MAXIPIME) 2 g in sodium chloride 0.9 % 100 mL IVPB        2 g 200 mL/hr over 30 Minutes Intravenous  Once 03/10/20 1058 03/10/20 1247   03/10/20 1100  vancomycin (VANCOCIN) IVPB 1000 mg/200 mL premix        1,000 mg 200 mL/hr over 60 Minutes Intravenous  Once 03/10/20 1058 03/10/20 1331   03/10/20 1100  piperacillin-tazobactam (ZOSYN) IVPB 3.375 g  Status:  Discontinued        3.375 g 12.5 mL/hr over 240 Minutes Intravenous  Once 03/10/20 1058 03/10/20 1105      Subjective:  Patient seen and examined the bedside this morning.  Hemodynamically stable.  His breathing has remained stable since last 24 to 48 hours.  I did not hear any stridor.  Does not  complain of any dyspnea.  Agreeable for tracheostomy.  Objective: Vitals:   03/16/20 1300 03/16/20 1938 03/16/20 2125 03/17/20 0525  BP: 126/81  102/72 117/79  Pulse: 96  99 89  Resp: 18  19 19   Temp: 98.5 F (36.9 C)  97.8 F (36.6 C) 98 F (36.7 C)  TempSrc: Oral  Oral   SpO2:  95% 100% 98%  Weight:      Height:        Intake/Output Summary (Last 24 hours) at 03/17/2020 1208 Last data filed at 03/17/2020 0500 Gross per 24 hour  Intake --  Output 1300 ml  Net -1300 ml   Filed Weights   03/10/20 1042 03/12/20 1748  Weight: 50 kg 49.8 kg    Examination:  General exam: Very deconditioned ambulatory, chronically looking, malnourished HEENT: Tracheostomy scar Respiratory system: Bilateral diminished breath sounds, no wheezes or crackles  Cardiovascular system: S1 & S2 heard, RRR. No JVD, murmurs, rubs, gallops or clicks. Gastrointestinal system: Abdomen is nondistended, soft and nontender. No organomegaly or masses felt. Normal bowel sounds heard. Central nervous system: Alert and oriented. No focal neurological  deficits. Extremities: No edema, no clubbing ,no cyanosis Skin: No rashes, lesions or ulcers,no icterus ,no pallor  Data Reviewed: I have personally reviewed following labs and imaging studies  CBC: Recent Labs  Lab 03/12/20 0348 03/16/20 0547  WBC 7.2 14.1*  NEUTROABS 6.0 13.4*  HGB 10.8* 9.2*  HCT 36.0* 29.0*  MCV 79.3* 74.6*  PLT 270 409   Basic Metabolic Panel: Recent Labs  Lab 03/12/20 0348 03/13/20 0558 03/14/20 0535 03/14/20 1538 03/15/20 0543 03/16/20 0547 03/17/20 0605  NA 154* 150* 147*  --  145 142 143  K 3.6 2.6* 2.8* 3.3* 3.6 3.3* 3.8  CL 118* 114* 110  --  107 101 103  CO2 24 25 29   --  28 28 28   GLUCOSE 138* 128* 168*  --  178* 189* 202*  BUN 21 11 9   --  11 16 17   CREATININE 0.70 0.65 0.68  --  0.61 0.57* 0.59*  CALCIUM 8.7* 8.2* 8.0*  --  8.5* 8.2* 8.5*  MG 1.9 1.7 1.5*  --  1.7  --   --    GFR: Estimated Creatinine Clearance: 65.7 mL/min (A) (by C-G formula based on SCr of 0.59 mg/dL (L)). Liver Function Tests: No results for input(s): AST, ALT, ALKPHOS, BILITOT, PROT, ALBUMIN in the last 168 hours. No results for input(s): LIPASE, AMYLASE in the last 168 hours. No results for input(s): AMMONIA in the last 168 hours. Coagulation Profile: Recent Labs  Lab 03/11/20 0337  INR 1.1   Cardiac Enzymes: No results for input(s): CKTOTAL, CKMB, CKMBINDEX, TROPONINI in the last 168 hours. BNP (last 3 results) No results for input(s): PROBNP in the last 8760 hours. HbA1C: Recent Labs    03/14/20 1537  HGBA1C 6.3*   CBG: Recent Labs  Lab 03/16/20 1907 03/16/20 2015 03/16/20 2312 03/17/20 0402 03/17/20 0755  GLUCAP 183* 179* 174* 196* 158*   Lipid Profile: No results for input(s): CHOL, HDL, LDLCALC, TRIG, CHOLHDL, LDLDIRECT in the last 72 hours. Thyroid Function Tests: No results for input(s): TSH, T4TOTAL, FREET4, T3FREE, THYROIDAB in the last 72 hours. Anemia Panel: No results for input(s): VITAMINB12, FOLATE, FERRITIN, TIBC, IRON,  RETICCTPCT in the last 72 hours. Sepsis Labs: Recent Labs  Lab 03/10/20 1352 03/11/20 0508 03/12/20 0348  PROCALCITON  --  0.57  --   LATICACIDVEN 2.1*  --  1.4  Recent Results (from the past 240 hour(s))  Blood Culture (routine x 2)     Status: None   Collection Time: 03/10/20 12:15 PM   Specimen: BLOOD LEFT FOREARM  Result Value Ref Range Status   Specimen Description BLOOD LEFT FOREARM  Final   Special Requests   Final    BOTTLES DRAWN AEROBIC AND ANAEROBIC Blood Culture adequate volume   Culture   Final    NO GROWTH 5 DAYS Performed at [facility], [address], [place], [place] [address]    Report Status 03/15/2020 FINAL  Final  Blood Culture (routine x 2)     Status: None   Collection Time: 03/10/20 12:15 PM   Specimen: BLOOD RIGHT FOREARM  Result Value Ref Range Status   Specimen Description   Final    BLOOD RIGHT FOREARM Performed at [facility], [place] [address] [place], [place] [address]    Special Requests   Final    BOTTLES DRAWN AEROBIC AND ANAEROBIC Blood Culture results may not be optimal due to an inadequate volume of blood received in culture bottles Performed at [place] [address], [place], [place] [address]    Culture   Final    NO GROWTH 5 DAYS Performed at [facility], [place] [address], [place], [place] [address]    Report Status 03/15/2020 FINAL  Final  Resp Panel by RT-PCR (Flu A&B, Covid) Nasopharyngeal Swab     Status: None   Collection Time: 03/10/20  1:00 PM   Specimen: Nasopharyngeal Swab; Nasopharyngeal(NP) swabs in vial transport medium  Result Value Ref Range Status   SARS Coronavirus 2 by RT PCR NEGATIVE NEGATIVE Final    Comment: (NOTE) SARS-CoV-2 target nucleic acids are NOT DETECTED.  The SARS-CoV-2 RNA is generally detectable in upper respiratory specimens during the acute phase of infection. The lowest concentration of SARS-CoV-2 viral copies this assay can detect is 138  copies/mL. A negative result does not preclude SARS-Cov-2 infection and should not be used as the sole basis for treatment or other patient management decisions. A negative result may occur with  improper specimen collection/handling, submission of specimen other than nasopharyngeal swab, presence of viral mutation(s) within the areas targeted by this assay, and inadequate number of viral copies(<138 copies/mL). A negative result must be combined with clinical observations, patient history, and epidemiological information. The expected result is Negative.  Fact Sheet for Patients:  EntrepreneurPulse.com.au  Fact Sheet for Healthcare Providers:  IncredibleEmployment.be  This test is no t yet approved or cleared by the [place] FDA and  has been authorized for detection and/or diagnosis of SARS-CoV-2 by FDA under an Emergency Use Authorization (EUA). This EUA will remain  in effect (meaning this test can be used) for the duration of the COVID-19 declaration under Section 564(b)(1) of the Act, 21 U.S.C.section 360bbb-3(b)(1), unless the authorization is terminated  or revoked sooner.       Influenza A by PCR NEGATIVE NEGATIVE Final   Influenza B by PCR NEGATIVE NEGATIVE Final    Comment: (NOTE) The Xpert Xpress SARS-CoV-2/FLU/RSV plus assay is intended as an aid in the diagnosis of influenza from Nasopharyngeal swab specimens and should not be used as a sole basis for treatment. Nasal washings and aspirates are unacceptable for Xpert Xpress SARS-CoV-2/FLU/RSV testing.  Fact Sheet for Patients: EntrepreneurPulse.com.au  Fact Sheet for Healthcare Providers: IncredibleEmployment.be  This test is not yet approved or cleared by the [place] FDA and has  been authorized for detection and/or diagnosis of SARS-CoV-2 by FDA under an Emergency Use Authorization (EUA). This EUA will remain in effect (meaning  this test can be used) for the duration of the COVID-19 declaration under Section 564(b)(1) of the Act, 21 U.S.C. section 360bbb-3(b)(1), unless the authorization is terminated or revoked.  Performed at [facility], [place] [address], [place], [place] [address]   Urine culture     Status: None   Collection Time: 03/10/20  2:00 PM   Specimen: In/Out Cath Urine  Result Value Ref Range Status   Specimen Description   Final    IN/OUT CATH URINE Performed at [place] [address] [place], [place] [address]    Special Requests   Final    NONE Performed at [facility], [place] [address] [place], [place] [address]    Culture   Final    NO GROWTH Performed at [facility], [place] [address] [place], [place] [address]    Report Status 03/12/2020 FINAL  Final  MRSA PCR Screening     Status: None   Collection Time: 03/10/20  8:38 PM   Specimen: Nasopharyngeal  Result Value Ref Range Status   MRSA by PCR NEGATIVE NEGATIVE Final    Comment:        The GeneXpert MRSA Assay (FDA approved for NASAL specimens only), is one component of a comprehensive MRSA colonization surveillance program. It is not intended to diagnose MRSA infection nor to guide or monitor treatment for MRSA infections. Performed at [facility], [place] [address], [place], [place] [address]          Radiology Studies: No results found.      Scheduled Meds: . amoxicillin-clavulanate  1 tablet Oral Q12H  . chlorhexidine  15 mL Mouth Rinse BID  . [START ON 03/18/2020] dexamethasone  2 mg Oral Q12H  . [START ON 03/20/2020] dexamethasone  2 mg Oral Daily  . dexamethasone  4 mg Oral Q8H  . enoxaparin (LOVENOX) injection  40 mg Subcutaneous Q24H  . feeding supplement (OSMOLITE 1.5 CAL)  1,000 mL Per Tube Q24H  . feeding supplement (PROSource TF)  45 mL Per Tube Daily  . free water  200 mL Per Tube Q4H  . insulin aspart  0-15  Units Subcutaneous Q4H  . mouth rinse  15 mL Mouth Rinse q12n4p  . nystatin  5 mL Oral QID  . senna  2 tablet Oral Daily  . thiamine  100 mg Oral Daily   Continuous Infusions:    LOS: 7 days    Time spent: 35 mins,More than 50% of that time was spent in counseling and/or coordination of care.      Shelly Coss, MD [facility] P1/13/2022, 12:08 PM

## 2020-03-17 NOTE — Progress Notes (Addendum)
Nutrition Follow-up  DOCUMENTATION CODES:   Severe malnutrition in context of chronic illness,Underweight  INTERVENTION:  - will adjust TF regimen: Jevity 1.5 @ 20 ml/hr to advance by 10 ml every 12 hours to reach goal rate of 50 ml/hr with 45 ml Prosource TF once/day and 200 ml free water every 4 hours. - at goal rate, this regimen will provide 1840 kcal, 87 grams protein, 25 grams fiber, and 2112 ml free water.  - weigh patient today.   NUTRITION DIAGNOSIS:   Severe Malnutrition related to chronic illness,cancer and cancer related treatments as evidenced by severe fat depletion,severe muscle depletion. -revised, ongoing  GOAL:   Patient will meet greater than or equal to 90% of their needs -met with TF regimen  MONITOR:   TF tolerance,Labs,Weight trends  ASSESSMENT:   Patient with PMH significant for metastatic lung cancer s/p G-tube and COPD. Presents this admission with sepsis 2/2 to UTI/PNA.  Patient recently returned from XRT; palliative treatment. Oncology note from earlier today indicates that tomorrow will be last date of palliative XRT treatment d/t progressive disease in R lung.   Patient with PEG and is receiving TF at goal rate: Osmolite 1.5 @ 50 ml/hr with 45 ml Prosource TF once/day and 200 ml free water every 4 hours. This regimen is providing 1840 kcal, 86 grams protein, 0 grams fiber, and 2114 ml free water.  He denies abdominal pain/pressure or nausea. He is bothered by frequency of stools and that they are liquidy.   He has not been weighed since 1/8. Admission date of 1/6.   At the time of visit, HOB was at 16 degrees and TF was infusing. Adjusted HOB and left it at 33 degrees (must be >/= 30 degrees when TF infusing).    Labs reviewed; CBGs: 196, 158, 111 mg/dl, creatinine: 0.59 mg/dl, Ca: 8.5 mg/dl. Medications reviewed; sliding scale novolog, 5 ml mycostatin QID, 2 tablets senokot/day, 100 mg thiamine/day.     NUTRITION - FOCUSED PHYSICAL  EXAM:  Flowsheet Row Most Recent Value  Orbital Region Moderate depletion  Upper Arm Region Severe depletion  Thoracic and Lumbar Region Severe depletion  Buccal Region Severe depletion  Temple Region No depletion  Clavicle Bone Region Severe depletion  Clavicle and Acromion Bone Region Severe depletion  Scapular Bone Region Unable to assess  Dorsal Hand Moderate depletion  Patellar Region Severe depletion  Anterior Thigh Region Severe depletion  Posterior Calf Region Severe depletion  Edema (RD Assessment) None  Hair Reviewed  Eyes Reviewed  Mouth Reviewed  Skin Reviewed  Nails Reviewed       Diet Order:   Diet Order            Diet NPO time specified Except for: Ice Chips  Diet effective now                 EDUCATION NEEDS:   Education needs have been addressed  Skin:  Skin Assessment: Reviewed RN Assessment  Last BM:  1/13 (type 7 x2)  Height:   Ht Readings from Last 1 Encounters:  03/12/20 $RemoveB'5\' 11"'mbaWFVyW$  (1.803 m)    Weight:   Wt Readings from Last 1 Encounters:  03/12/20 49.8 kg    Estimated Nutritional Needs:  Kcal:  1850-2050 kcal Protein:  80-100 grams Fluid:  >/= 2 L/day     Jarome Matin, MS, RD, LDN, CNSC Inpatient Clinical Dietitian RD pager # available in [facility]  After hours/weekend pager # available in [facility]

## 2020-03-17 NOTE — TOC Progression Note (Addendum)
Transition of Care [facility]) - Progression Note    Patient Details  Name: Devon Peterson MRN: [id number] Date of Birth: 1955/05/24  Transition of [facility]) CM/SW Contact  Montee Tallman, Juliann Pulse, RN Phone Number: 03/17/2020, 12:57 PM  Clinical Narrative:Received referral for home w/hospice services-Spoke to sister Devon Peterson is not in agreement to home w/hospice-she says she had a conversation about hospice but nothing was agreed, currently d/c plan return back to [place].Noted patient is for Tracheostomy. MD updated.   3:12p-Noted per attending-trach cancelled,d/c plan return back to [facility] w/PCS in am- rep Shazma aware. covid to be ordered.   Expected Discharge Plan: [place] Barriers to Discharge: Continued Medical Work up  Expected Discharge Plan and Services Expected Discharge Plan: [place]   Discharge Planning Services: CM Consult   Living arrangements for the past 2 months: [place]                                       Social Determinants of Health (SDOH) Interventions    Readmission Risk Interventions Readmission Risk Prevention Plan 07/27/2019  Transportation Screening Complete  HRI or Home Care Consult Complete  Social Work Consult for [place] Planning/Counseling Complete  Palliative Care Screening Complete  Medication Review Press photographer) Complete  Some recent data might be hidden

## 2020-03-17 NOTE — TOC Progression Note (Signed)
Transition of Care [facility]) - Progression Note    Patient Details  Name: Devon Peterson MRN: [id number] Date of Birth: [birth year]  Transition of Care [facility]) CM/SW Contact  Jeanne Terrance, Juliann Pulse, RN Phone Number: 03/17/2020, 3:45 PM  Clinical Narrative: Case manager spoke to sister Eunice-home w/hospice may not be appropriate since patient will live w/mother,having water issues. Patient is from [facility] [place], can have palliative care services.      Expected Discharge Plan: [place] Barriers to Discharge: Continued Medical Work up  Expected Discharge Plan and Services Expected Discharge Plan: [place]   Discharge Planning Services: CM Consult   Living arrangements for the past 2 months: [place]                                       Social Determinants of Health (SDOH) Interventions    Readmission Risk Interventions Readmission Risk Prevention Plan 07/27/2019  Transportation Screening Complete  HRI or Home Care Consult Complete  Social Work Consult for [place] Planning/Counseling Complete  Palliative Care Screening Complete  Medication Review Press photographer) Complete  Some recent data might be hidden

## 2020-03-17 NOTE — Plan of Care (Signed)
  Problem: Health Behavior/Discharge Planning: Goal: Ability to manage health-related needs will improve Outcome: Progressing   

## 2020-03-17 NOTE — Discharge Summary (Addendum)
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 fib, aspiration pneumonia who was sent from skilled nursing facility for the evaluation of shortness of breath, cough, increased sputum production, fever and hypoxia.  CT imaging on presentation did not show any PE but showed left lower lobe pneumonia, debris in the left lower lobe bronchus.  Also showed new metastatic lung lesions on the right upper and lower lobes.  UA was also concerning for UTI.  Hospital course remarkable for electrolyte abnormalities.  Palliative care also consulted for goals of care.  On 03/14/2020, he was found to have a stridor which has improved after starting on decadron.  Extensive discussion held with the patient and his sister about goals of care.  Patient remains full code.  Patient was interested for home with hospice but due to his poor home environment, hospice could not be arranged.  Plan is to discharge him back to the skilled nursing facility with palliative care follow-up.  Following problems were addressed during his hospitalization:  Sepsis secondary  lobar pneumonia: Suspected aspiration pneumonia.  Debris seen on the left lower lobe bronchus.  COVID screen negative.  Cultures have not shown any growth.  Speech therapy were following. Finished  Rocephin and Flagyl,day 7.  Currently afebrile.  Lactic acidosis, leukocytosis resolved.  We changed  antibiotic to Augmentin to complete total abx course of 10  days.  Acute hypoxic respiratory failure: Secondary to pneumonia.  He  is on 3-4 L of oxygen per minute . CXR on 03/15/19 showed Worsening collapse/consolidation  left lower lobe due to worsening mucous plugging.CXR also showed large pleural effusion,ordered US guided thoracentesis but was not found to have significant fluid as per ultrasound.  PCCM were following,now signed off. CT chest on 03/15/19 showed patchy debris throughout the left lung airways,dense patchy consolidation and volume loss throughout the lingula and left lower lobe with scattered air bronchograms.Findings suggesting  combination of aspiration, pneumonia, atelectasis, evolving postradiation change and/or residual tumor.Also showed  solid pulmonary nodules scattered throughout the right lung.  Currently his respiratory status is stable.  Stridor/History of laryngeal cancer/dysphagia: Status post trach but decannulated on 6/21.  Developed dysphagia from radiation therapy.  Status post PEG.  Currently NPO and on tube feeding.  Speech is following.  CT soft tissue neck  showed circumferential mucosal thickening of the supraglottic larynx with narrowing of the transverse dimension of the airway to 3 mm,Laryngeal thickening has markedly worsened compared to 11/02/2019. New/recurrent tumor or post radiation change is a possibility. ENT where following.  There was plan for Tracheostomy as per [name] but patient declined. Stridor has improved with decadron.  Will recommend to follow-up with ENT as an outpatient for further consideration of palliative tracheostomy if patient agrees in the future.  History of COPD: No wheezing.  Continue bronchodilators as needed  Hypernatremia:Suspected to be from dehydration. Resolved  Severe hypokalemia/hypomagnesemia: Supplemented and corrected  Paroxysmal A. fib: Currently rate is controlled.  Not on anticoagulation.  Currently in normal sinus rhythm.  Metoprolol discontinued due to soft  blood pressure  Severe  protein calorie malnutrition: BMI of 15.31.  On tube feeding diet  Recently diagnosed right squamous cell lung cancer: Follows with Dr. Learta Codding.  CT imaging concerning for metastatic disease. On radiation therapy.  Oncology recommended hospice care.  Oral thrush: On nystatin  Normocytic anemia: Most likely secondary to malignancy.  Hemoglobin currently stable,moniotr as an outpatient  Goals of care: Extensive discussion held with the patient and his sister about goals of care.  Patient remains full code.  Patient was interested for home with hospice but due to his poor home environment, hospice could not be arranged.  Plan is to discharge him back to the skilled nursing facility with palliative care follow-up  Discharge Diagnoses:  Active Problems:   Sepsis ([place])   Palliative care encounter   Hypoxia   Acute cystitis without hematuria   Squamous cell carcinoma of lung ([place])   Weakness    Discharge Instructions  Discharge Instructions    Diet general   Complete by: As directed    Tube feeding   Discharge instructions   Complete by: As directed    1)Please take prescribed medications as instructed 2)Follow up with ENT as an outpatient in 2-4 weeks.  Name and number of the provider has been attached 3)Follow up with palliative care as an outpatient. 4)Do a CBC and BMP test in a week   Increase activity slowly   Complete by: As directed      Allergies as of 03/18/2020      Reactions   Albumin (human) Itching   May be able to handle at low rates.      Medication List    STOP taking these medications   metoprolol tartrate 25 MG tablet Commonly known as: LOPRESSOR     TAKE these medications   amoxicillin-clavulanate 875-125 MG tablet Commonly known as: AUGMENTIN Take 1 tablet by mouth every 12 (twelve) hours for 1 day. Start taking on: March 19, 2020   chlorhexidine gluconate (MEDLINE KIT) 0.12 % solution Commonly known as: PERIDEX 15  mLs by Mouth Rinse route 2 (two) times daily.   dexamethasone 2 MG tablet Commonly known as: DECADRON Take 1 tablet (2 mg total) by mouth every 12 (twelve) hours for 1 day. Start taking on: March 19, 2020   dexamethasone 2 MG tablet Commonly known as: DECADRON Take 1 tablet (2 mg total) by mouth daily for 3 days. Start taking on: March 20, 2020   feeding supplement (PRO-STAT SUGAR FREE 64) Liqd Place 30 mLs into feeding tube 2 (two) times daily.   ferrous sulfate 325 (65 FE) MG tablet Take 1 tablet (325 mg total) by mouth daily.   Ipratropium-Albuterol 20-100 MCG/ACT Aers respimat Commonly known as: COMBIVENT Inhale 1 puff into the lungs every 6 (six) hours as needed for wheezing.   Isosource 1.5 Cal Liqd Place 237 mLs into feeding tube 5 (five) times daily. What changed:   how much to take  when to take this  additional instructions   nystatin 100000 UNIT/ML suspension Commonly known as: MYCOSTATIN Take 5 mLs by mouth 4 (four) times daily as needed (thrush).   OVER THE COUNTER MEDICATION Take 120 mLs by mouth 2 (two) times daily. 165m resource twice daily with med pass   oxycodone 5 MG capsule Commonly known as: OXY-IR Take 5 mg by mouth every 8 (eight) hours as needed for pain.   pantoprazole 40 MG tablet Commonly known as: Protonix Take 1 tablet (40 mg total) by mouth daily.   polyethylene glycol 17  g packet Commonly known as: MIRALAX / GLYCOLAX Take 17 g by mouth daily.   senna 8.6 MG Tabs tablet Commonly known as: SENOKOT Take 2 tablets (17.2 mg total) by mouth daily.   sertraline 50 MG tablet Commonly known as: Zoloft Take 1 tablet (50 mg total) by mouth daily.   thiamine 100 MG tablet Take 1 tablet (100 mg total) by mouth daily.       Contact information for follow-up providers    Izora Gala, MD. Schedule an appointment as soon as possible for a visit in 4 week(s).   Specialty: Otolaryngology Contact information: [address] [place] [address] [phone]            Contact information for after-discharge care    [facility] .   Service: Skilled Nursing Contact information: [place] [address] [phone]                 Allergies  Allergen Reactions  . Albumin (Human) Itching    May be able to handle at low rates.    Consultations:  Palliative care, oncology, ENT   Procedures/Studies: DG Chest 2 View  Result Date: 03/10/2020 CLINICAL DATA:  History of lung carcinoma.  Shortness of breath EXAM: CHEST - 2 VIEW COMPARISON:  Chest CT November 02, 2019; chest radiograph August 20, 2019. PET-CT November 26, 2019 FINDINGS: There is opacity in the posterior left base at the site of known mass. Mass is ill-defined by current radiographic examination with suspected surrounding ill-defined pneumonitis. Elsewhere no edema or airspace opacity evident. Heart size and pulmonary vascular normal. No adenopathy. No bone lesions. IMPRESSION: Airspace opacity posterior left base, likely due to combination of mass and surrounding pneumonitis. Lungs elsewhere clear. Heart size normal. No adenopathy evident. Electronically Signed   By: Lowella Grip III M.D.   On: 03/10/2020 10:26   CT SOFT TISSUE NECK WO CONTRAST  Result Date: 03/14/2020 CLINICAL DATA:  Stridor.  Head and neck cancer. EXAM: CT NECK WITHOUT CONTRAST TECHNIQUE: Multidetector CT imaging of the neck was performed following the standard protocol without intravenous contrast. COMPARISON:  CT neck 11/02/2019 Head CT 11/26/2019 FINDINGS: Pharynx and larynx: Lack of intravenous contrast agent and marked motion at the level of the epiglottis and larynx limits study. Within that limitation, there is circumferential mucosal thickening of the supraglottic larynx with narrowing of the transverse dimension of the airway to 3 mm. There is no retropharyngeal collection. The pharynx and  oral cavity are unremarkable. Salivary glands: No inflammation, mass, or stone. Thyroid: Normal. Lymph nodes: None enlarged or abnormal density. Vascular: Atherosclerotic calcification of the internal carotid arteries. Limited intracranial: Negative. Visualized orbits: Negative. Mastoids and visualized paranasal sinuses: Clear. Skeleton: No acute or aggressive process. Upper chest: Please see dedicated report for concomitant chest CT. Other: None. IMPRESSION: 1. Lack of intravenous contrast agent and marked motion at the level of the epiglottis and larynx limits study. 2. Circumferential mucosal thickening of the supraglottic larynx with narrowing of the transverse dimension of the airway to 3 mm. Laryngeal thickening is markedly worsened compared to 11/02/2019. New/recurrent tumor or post radiation change could both account for this appearance. 3. No cervical lymphadenopathy. 4. Please see report for concomitant CT of the chest for discussion of left lung findings. Aortic Atherosclerosis (ICD10-I70.0). Electronically Signed   By: Ulyses Jarred M.D.   On: 03/14/2020 20:37   CT CHEST WO CONTRAST  Result Date: 03/15/2020  CLINICAL DATA:  Inpatient. 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 EXAM: CT CHEST WITHOUT CONTRAST TECHNIQUE: Multidetector CT imaging of the chest was performed following the standard protocol without IV contrast. COMPARISON:  03/10/2020 chest CT angiogram. 11/26/2019 PET-CT. 11/02/2027 chest CT. FINDINGS: Cardiovascular: Normal heart size. No significant pericardial effusion/thickening. Left anterior descending coronary atherosclerosis. Great vessels are normal in course and caliber. Mediastinum/Nodes: No discrete thyroid nodules. Mildly patulous thoracic esophagus containing a small amount of oral contrast. No axillary adenopathy. Mild AP window adenopathy up to 1.1 cm (series 1/image 64),  new. No discrete hilar adenopathy on these noncontrast images. Lungs/Pleura: No pneumothorax. Small dependent left pleural effusion. No right pleural effusion. Moderate centrilobular and paraseptal emphysema with mild diffuse bronchial wall thickening. Patchy debris throughout left lung airways. Dense patchy consolidation and volume loss throughout the lingula and left lower lobe with scattered air bronchograms, similar to 03/10/2020 chest CT angiogram study, obscuring the left lower lobe mass seen on 11/02/2019 chest CT. Multiple (at least 5) solid pulmonary nodules scattered throughout the right lung, all new or increased since 11/02/2019 chest CT and unchanged since recent 03/10/2020 chest CT angiogram study. Representative 10 mm right upper lobe nodule (series 5/image 55), increased from 3 mm on 11/02/2019 CT. Representative anterior right lower lobe 8 mm nodule (series 5/image 107), new. Upper abdomen: No acute abnormality. Musculoskeletal: No aggressive appearing focal osseous lesions. Mild thoracic spondylosis. IMPRESSION: 1. Patchy debris throughout the left lung airways. Dense patchy consolidation and volume loss throughout the lingula and left lower lobe with scattered air bronchograms, similar to recent 03/11/2019 chest CT angiogram study, obscuring the left lower lobe mass seen on 11/02/2019 chest CT. Findings could represent any combination of aspiration, pneumonia, atelectasis, evolving postradiation change and/or residual tumor. 2. Multiple (at least 5) solid pulmonary nodules scattered throughout the right lung, all new or increased since 11/02/2019 chest CT and unchanged since recent 03/11/2019 chest CT. 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 3. New mild AP window adenopathy, nonspecific, cannot exclude nodal metastasis versus reactive etiology. Attention on follow-up chest CT advised. 4. Small dependent left pleural effusion. 5. One vessel coronary atherosclerosis. 6.  Aortic Atherosclerosis (ICD10-I70.0) and Emphysema (ICD10-J43.9). Electronically Signed   By: Ilona Sorrel M.D.   On: 03/15/2020 09:19   CT Angio Chest PE W and/or Wo Contrast  Result Date: 03/10/2020 CLINICAL DATA:  Known lung cancer currently undergoing radiation therapy (last yesterday). Sepsis, tachycardia and hypoxia. COVID test was negative. Evaluate for pneumonia versus pulmonary embolus. EXAM: CT ANGIOGRAPHY CHEST WITH CONTRAST TECHNIQUE: Multidetector CT imaging of the chest was performed using the standard protocol during bolus administration of intravenous contrast. Multiplanar CT image reconstructions and MIPs were obtained to evaluate the vascular anatomy. CONTRAST:  175m OMNIPAQUE IOHEXOL 350 MG/ML SOLN COMPARISON:  Recent PET-CT 11/26/2019; CT biopsy 02/11/2020 FINDINGS: Cardiovascular: Satisfactory opacification of the pulmonary arteries to the segmental level. No evidence of pulmonary embolism. Normal heart size. No pericardial effusion. Coronary artery calcifications. Mediastinum/Nodes: No enlarged mediastinal, hilar, or axillary lymph nodes. Thyroid gland, trachea, and esophagus demonstrate no significant findings. Lungs/Pleura: Enlarging right upper lobe pulmonary nodule measures up to 0.9 cm and is highly concerning for metastatic disease. Enlarging nodule within the superior segment of the right lower lobe now measures 0.5 cm. Enlarging right lower lobe pulmonary nodule now measuring up to 0.7 cm. Interval development of extensive consolidative airspace opacities throughout the left lower lobe with  associated volume loss. The left mainstem bronchus is completely obscured and may be stenosed or impacted with debris. The previously identified mass can be vaguely identified as low attenuation in the center of the consolidated left lower lobe. Background of centrilobular pulmonary emphysema. No pleural effusion. Upper Abdomen: No acute abnormality. Percutaneous gastrostomy tube in place.  Musculoskeletal: No chest wall abnormality. No acute or significant osseous findings. Review of the MIP images confirms the above findings. IMPRESSION: 1. Negative for acute pulmonary embolus. 2. Interval development of dense consolidation and associated volume loss within the left lower lobe. The left lower lobe bronchus is also completely obscured and is likely impacted with debris. Findings are most concerning for lobar pneumonia. Acute radiation pneumonitis is possible but considered less likely given the timing (radiation therapy is still on going). 3. At least 3 enlarging pulmonary nodules are identified in the right upper and lower lobes. These were barely identifiable as punctate foci on the prior PET-CT. Enlargement over the past 3 months is highly concerning for metastatic disease. 4. Coronary artery calcifications. 5. Percutaneous gastrostomy tube in place. 6. Centrilobular pulmonary emphysema. Emphysema (ICD10-J43.9). Electronically Signed   By: Jacqulynn Cadet M.D.   On: 03/10/2020 17:23   [place] CHEST (PLEURAL EFFUSION)  Result Date: 03/15/2020 CLINICAL DATA:  66 year old male referred for possible thoracentesis EXAM: CHEST ULTRASOUND COMPARISON:  CT 03/14/2020 FINDINGS: Ultrasound performed demonstrating scant pleural fluid on the left, with no pleural fluid on the right. Thoracentesis not performed at this time. IMPRESSION: Scant left-sided pleural fluid with no adequate window for aspiration. Electronically Signed   By: Corrie Mckusick D.O.   On: 03/15/2020 12:26   DG CHEST PORT 1 VIEW  Result Date: 03/14/2020 CLINICAL DATA:  Shortness of breath. EXAM: PORTABLE CHEST 1 VIEW COMPARISON:  CT chest and chest radiograph 03/10/2020. FINDINGS: Leftward shift of the heart mediastinum. Increasing collapse/consolidation in the left upper and left lower lobes. Large left pleural effusion. Apparent cut off of the left mainstem bronchus. Right lung is clear. Retained oral contrast is seen in the esophagus  and stomach. IMPRESSION: Worsening collapse/consolidation in the lingula and left lower lobe with leftward shift of the heart and mediastinum, findings indicative of atelectasis/pneumonia due to worsening mucous plugging. Aspiration is not excluded. Electronically Signed   By: Lorin Picket M.D.   On: 03/14/2020 15:39   DG Swallowing Func-Speech Pathology  Result Date: 03/14/2020 Objective Swallowing Evaluation: Type of Study: MBS-Modified Barium Swallow Study  Patient Details Name: Ancelmo Hunt MRN: [id number] Date of Birth: [birth year] Today's Date: 03/14/2020 Time: SLP Start Time (ACUTE ONLY): 1450 -SLP Stop Time (ACUTE ONLY): 1505 SLP Time Calculation (min) (ACUTE ONLY): 15 min Past Medical History: Past Medical History: Diagnosis Date . Cancer ([place])  . COPD (chronic obstructive pulmonary disease) ([place])  . Medical history non-contributory  Past Surgical History: Past Surgical History: Procedure Laterality Date . BIOPSY  08/20/2019  Procedure: BIOPSY;  Surgeon: Chesley Mires, MD;  Location: [facility];  Service: Endoscopy;; . BRONCHIAL WASHINGS  08/20/2019  Procedure: BRONCHIAL WASHINGS;  Surgeon: Chesley Mires, MD;  Location: [facility];  Service: Endoscopy;;  LLL BAL . DIRECT LARYNGOSCOPY N/A 07/09/2019  Procedure: DIRECT LARYNGOSCOPY WITH BIOPSY;  Surgeon: Jerrell Belfast, MD;  Location: [facility];  Service: ENT;  Laterality: N/A; . IR FLUORO RM 30-60 MIN  07/13/2019 . IR GASTROSTOMY TUBE MOD SED  07/14/2019 . IR REPLACE G-TUBE SIMPLE WO FLUORO  10/09/2019 . NO PAST SURGERIES   . TRACHEOSTOMY TUBE PLACEMENT N/A 07/09/2019  Procedure: TRACHEOSTOMY;  Surgeon: Jerrell Belfast, MD;  Location: [facility];  Service: ENT;  Laterality: N/A; . VIDEO BRONCHOSCOPY Left 08/20/2019  Procedure: VIDEO BRONCHOSCOPY WITH FLUORO;  Surgeon: Chesley Mires, MD;  Location: [facility];  Service: Endoscopy;  Laterality: Left; HPI: Patient is a 65 y.o. male with PMH: metastatic lung cancer (just started round of XRT), COPD [facility] at [facility], who was found to be dyspneic and hypoxic and placed on 2L [place] and improved. He has had a couple days history of cough, increased sputnum producdtion and fevers so he was brought to ED.  CXR revealed Worsening collapse/consolidation in the lingula and left lower lobewith leftward shift of the heart and mediastinum, findings indicative of atelectasis/pneumonia due to worsening mucous plugging. Aspiration is not excluded, COVID negative. Patient was seen by speech therapy during previous admission in June of 2021 for dysphagia.  Subjective: pleasant, cooperative Assessment / Plan / Recommendation CHL IP CLINICAL IMPRESSIONS 03/14/2020 Clinical Impression Patient presents with a moderate oral and mod-severe pharyngeal dysphagia. As compared to previous MBS in June of 2020, swallow function has improved. He exhibited moderate vallecular residuals, mild pyriform and posterior pharyngeal wall residuals with puree solids but chin tuck did help clear this. Thin liquids and nectar thick liquids both resulted in diffuse residuals in pharynx, frequent penetration during and after swallow. In addition, esophageal backflow of boluses  were observed with thin/nectar liquids moving back into pharynx and with thin liquids, into laryngeal vestibule. Patient exhibited some belching and this was followed by him regurgitating a significant amount of barium mixed with secretions (this occured twice during study). Although aspiration was not observed, patient exhibited frequent penetration on residuals, which were never fully cleared from pharynx. In addition, esophageal backflow into pharynx and laryngeal vestibule also put patient at high risk of aspiration. SLP Visit Diagnosis -- Attention and concentration deficit following -- Frontal lobe and executive function deficit following -- Impact on safety and function --   CHL IP TREATMENT RECOMMENDATION 03/14/2020 Treatment Recommendations Therapy as outlined in treatment plan  below   Prognosis 03/14/2020 Prognosis for Safe Diet Advancement Fair Barriers to Reach Goals Time post onset;Severity of deficits Barriers/Prognosis Comment -- CHL IP DIET RECOMMENDATION 03/14/2020 SLP Diet Recommendations NPO Liquid Administration via -- Medication Administration Via alternative means Compensations -- Postural Changes Seated upright at 90 degrees   CHL IP OTHER RECOMMENDATIONS 03/14/2020 Recommended Consults -- Oral Care Recommendations Oral care QID Other Recommendations --   CHL IP FOLLOW UP RECOMMENDATIONS 03/14/2020 Follow up Recommendations Skilled Nursing facility;24 hour supervision/assistance   CHL IP FREQUENCY AND DURATION 03/14/2020 Speech Therapy Frequency (ACUTE ONLY) min 2x/week Treatment Duration 1 week      CHL IP ORAL PHASE 03/14/2020 Oral Phase Impaired Oral - Pudding Teaspoon -- Oral - Pudding Cup -- Oral - Honey Teaspoon -- Oral - Honey Cup -- Oral - Nectar Teaspoon -- Oral - Nectar Cup -- Oral - Nectar Straw -- Oral - Thin Teaspoon Reduced posterior propulsion;Delayed oral transit;Weak lingual manipulation Oral - Thin Cup Lingual pumping;Delayed oral transit;Reduced posterior propulsion;Weak lingual manipulation Oral - Thin Straw -- Oral - Puree Delayed oral transit;Piecemeal swallowing;Reduced posterior propulsion;Weak lingual manipulation;Lingual pumping Oral - Mech Soft -- Oral - Regular -- Oral - Multi-Consistency -- Oral - Pill -- Oral Phase - Comment --  CHL IP PHARYNGEAL PHASE 03/14/2020 Pharyngeal Phase Impaired Pharyngeal- Pudding Teaspoon -- Pharyngeal -- Pharyngeal- Pudding Cup -- Pharyngeal -- Pharyngeal- Honey Teaspoon -- Pharyngeal -- Pharyngeal- Honey Cup -- Pharyngeal -- Pharyngeal- Nectar Teaspoon --  Pharyngeal -- Pharyngeal- Nectar Cup Delayed swallow initiation-vallecula;Reduced pharyngeal peristalsis;Reduced laryngeal elevation;Pharyngeal residue - valleculae;Pharyngeal residue - pyriform;Pharyngeal residue - posterior pharnyx;Penetration/Aspiration during  swallow;Penetration/Apiration after swallow Pharyngeal Material enters airway, remains ABOVE vocal cords and not ejected out Pharyngeal- Nectar Straw -- Pharyngeal -- Pharyngeal- Thin Teaspoon Reduced airway/laryngeal closure;Delayed swallow initiation-vallecula;Reduced laryngeal elevation;Penetration/Apiration after swallow;Penetration/Aspiration during swallow;Pharyngeal residue - valleculae;Pharyngeal residue - pyriform;Pharyngeal residue - posterior pharnyx Pharyngeal Material enters airway, remains ABOVE vocal cords and not ejected out Pharyngeal- Thin Cup Delayed swallow initiation-vallecula;Reduced airway/laryngeal closure;Reduced laryngeal elevation;Penetration/Aspiration during swallow;Penetration/Apiration after swallow;Pharyngeal residue - valleculae;Pharyngeal residue - pyriform;Pharyngeal residue - posterior pharnyx Pharyngeal Material enters airway, remains ABOVE vocal cords and not ejected out Pharyngeal- Thin Straw -- Pharyngeal -- Pharyngeal- Puree Delayed swallow initiation-vallecula;Pharyngeal residue - valleculae;Pharyngeal residue - pyriform;Pharyngeal residue - posterior pharnyx Pharyngeal -- Pharyngeal- Mechanical Soft -- Pharyngeal -- Pharyngeal- Regular -- Pharyngeal -- Pharyngeal- Multi-consistency -- Pharyngeal -- Pharyngeal- Pill -- Pharyngeal -- Pharyngeal Comment --  CHL IP CERVICAL ESOPHAGEAL PHASE 03/14/2020 Cervical Esophageal Phase Impaired Pudding Teaspoon -- Pudding Cup -- Honey Teaspoon -- Honey Cup -- Nectar Teaspoon -- Nectar Cup Esophageal backflow into the pharynx;Reduced cricopharyngeal relaxation Nectar Straw -- Thin Teaspoon -- Thin Cup Esophageal backflow into the pharynx;Reduced cricopharyngeal relaxation;Esophageal backflow into the larynx Thin Straw -- Puree Reduced cricopharyngeal relaxation Mechanical Soft -- Regular -- Multi-consistency -- Pill -- Cervical Esophageal Comment -- Sonia Baller, MA, CCC-SLP Speech Therapy                Subjective: Patient seen  and examined at the bedside this morning.  Hemodynamically stable.  Stable for transfer to skilled nursing facility.  Discharge Exam: Vitals:   03/17/20 2006 03/18/20 0420  BP: 103/75 118/85  Pulse: 83 85  Resp: 20 20  Temp: 97.9 F (36.6 C) 97.6 F (36.4 C)  SpO2: 100% 99%   Vitals:   03/17/20 1421 03/17/20 1600 03/17/20 2006 03/18/20 0420  BP: 97/71  103/75 118/85  Pulse: 83  83 85  Resp: _0 Temp: 98.2 F (36.8 C)  97.9 F (36.6 C) 97.6 F (36.4 C)  TempSrc: Oral  Oral Oral  SpO2: 98%  100% 99%  Weight:  54.3 kg    Height:        General: Pt is alert, awake, not in acute distress Cardiovascular: RRR, S1/S2 +, no rubs, no gallops Respiratory: Diminished air sounds bilaterally, no wheezing, no rhonchi Abdominal: Soft, NT, ND, bowel sounds +, PEG tube Extremities: no edema, no cyanosis    The results of significant diagnostics from this hospitalization (including imaging, microbiology, ancillary and laboratory) are listed below for reference.     Microbiology: Recent Results (from the past 240 hour(s))  Blood Culture (routine x 2)     Status: None   Collection Time: 03/10/20 12:15 PM   Specimen: BLOOD LEFT FOREARM  Result Value Ref Range Status   Specimen Description BLOOD LEFT FOREARM  Final   Special Requests   Final    BOTTLES DRAWN AEROBIC AND ANAEROBIC Blood Culture adequate volume   Culture   Final    NO GROWTH 5 DAYS Performed at [facility], [address] [place], [place] [address]    Report Status 03/15/2020 FINAL  Final  Blood Culture (routine x 2)     Status: None   Collection Time: 03/10/20 12:15 PM   Specimen: BLOOD RIGHT FOREARM  Result Value Ref Range Status   Specimen Description   Final  BLOOD RIGHT FOREARM Performed at [facility], [place] [address] [place], [place] [address]    Special Requests   Final    BOTTLES DRAWN AEROBIC AND ANAEROBIC Blood Culture results may not be optimal due to an inadequate volume of  blood received in culture bottles Performed at [facility] [address], [place], [place] [address]    Culture   Final    NO GROWTH 5 DAYS Performed at [facility], [place] [address] [place], [place] [address]    Report Status 03/15/2020 FINAL  Final  Resp Panel by RT-PCR (Flu A&B, Covid) Nasopharyngeal Swab     Status: None   Collection Time: 03/10/20  1:00 PM   Specimen: Nasopharyngeal Swab; Nasopharyngeal(NP) swabs in vial transport medium  Result Value Ref Range Status   SARS Coronavirus 2 by RT PCR NEGATIVE NEGATIVE Final    Comment: (NOTE) SARS-CoV-2 target nucleic acids are NOT DETECTED.  The SARS-CoV-2 RNA is generally detectable in upper respiratory specimens during the acute phase of infection. The lowest concentration of SARS-CoV-2 viral copies this assay can detect is 138 copies/mL. A negative result does not preclude SARS-Cov-2 infection and should not be used as the sole basis for treatment or other patient management decisions. A negative result may occur with  improper specimen collection/handling, submission of specimen other than nasopharyngeal swab, presence of viral mutation(s) within the areas targeted by this assay, and inadequate number of viral copies(<138 copies/mL). A negative result must be combined with clinical observations, patient history, and epidemiological information. The expected result is Negative.  Fact Sheet for Patients:  EntrepreneurPulse.com.au  Fact Sheet for Healthcare Providers:  IncredibleEmployment.be  This test is no t yet approved or cleared by the [place] FDA and  has been authorized for detection and/or diagnosis of SARS-CoV-2 by FDA under an Emergency Use Authorization (EUA). This EUA will remain  in effect (meaning this test can be used) for the duration of the COVID-19 declaration under Section 564(b)(1) of the Act, 21 U.S.C.section 360bbb-3(b)(1), unless  the authorization is terminated  or revoked sooner.       Influenza A by PCR NEGATIVE NEGATIVE Final   Influenza B by PCR NEGATIVE NEGATIVE Final    Comment: (NOTE) The Xpert Xpress SARS-CoV-2/FLU/RSV plus assay is intended as an aid in the diagnosis of influenza from Nasopharyngeal swab specimens and should not be used as a sole basis for treatment. Nasal washings and aspirates are unacceptable for Xpert Xpress SARS-CoV-2/FLU/RSV testing.  Fact Sheet for Patients: EntrepreneurPulse.com.au  Fact Sheet for Healthcare Providers: IncredibleEmployment.be  This test is not yet approved or cleared by the [place] FDA and has been authorized for detection and/or diagnosis of SARS-CoV-2 by FDA under an Emergency Use Authorization (EUA). This EUA will remain in effect (meaning this test can be used) for the duration of the COVID-19 declaration under Section 564(b)(1) of the Act, 21 U.S.C. section 360bbb-3(b)(1), unless the authorization is terminated or revoked.  Performed at [facility], [place] [address] [place], [place] [address]   Urine culture     Status: None   Collection Time: 03/10/20  2:00 PM   Specimen: In/Out Cath Urine  Result Value Ref Range Status   Specimen Description   Final    IN/OUT CATH URINE Performed at [facility] [address], [place], [place] [address]    Special Requests   Final    NONE Performed at [facility], [address] [place], [place] [address]    Culture   Final    NO GROWTH Performed at [facility], [place] [address] [place], [place] [address]    Report Status 03/12/2020 FINAL  Final  MRSA PCR Screening     Status: None   Collection Time: 03/10/20  8:38 PM   Specimen: Nasopharyngeal  Result Value Ref Range Status   MRSA by PCR NEGATIVE NEGATIVE Final    Comment:        The GeneXpert MRSA Assay (FDA approved for NASAL  specimens only), is one component of a comprehensive MRSA colonization surveillance program. It is not intended to diagnose MRSA infection nor to guide or monitor treatment for MRSA infections. Performed at [facility], [place] [address], [place],  [address]      Labs: BNP (last 3 results) Recent Labs    04/17/19 1211  BNP 61.6   Basic Metabolic Panel: Recent Labs  Lab 03/12/20 0348 03/13/20 0558 03/14/20 0535 03/14/20 1538 03/15/20 0543 03/16/20 0547 03/17/20 0605  NA 154* 150* 147*  --  145 142 143  K 3.6 2.6* 2.8* 3.3* 3.6 3.3* 3.8  CL 118* 114* 110  --  107 101 103  CO2 _0 --  _1 GLUCOSE 138* 128* 168*  --  178* 189* 202*  BUN _2 --  _3 CREATININE 0.70 0.65 0.68  --  0.61 0.57* 0.59*  CALCIUM 8.7* 8.2* 8.0*  --  8.5* 8.2* 8.5*  MG 1.9 1.7 1.5*  --  1.7  --   --    Liver Function Tests: No results for input(s): AST, ALT, ALKPHOS, BILITOT, PROT, ALBUMIN in the last 168 hours. No results for input(s): LIPASE, AMYLASE in the last 168 hours. No results for input(s): AMMONIA in the last 168 hours. CBC: Recent Labs  Lab 03/12/20 0348 03/16/20 0547  WBC 7.2 14.1*  NEUTROABS 6.0 13.4*  HGB 10.8* 9.2*  HCT 36.0* 29.0*  MCV 79.3* 74.6*  PLT 270 330   Cardiac Enzymes: No results for input(s): CKTOTAL, CKMB, CKMBINDEX, TROPONINI in the last 168 hours. BNP: Invalid input(s): POCBNP CBG: Recent Labs  Lab 03/17/20 1647 03/17/20 2004 03/17/20 2354 03/18/20 0409 03/18/20 0805  GLUCAP 168* 170* 126* 180* 133*   D-Dimer No results for input(s): DDIMER in the last 72 hours. Hgb A1c No results for input(s): HGBA1C in the last 72 hours. Lipid Profile No results for input(s): CHOL, HDL, LDLCALC, TRIG, CHOLHDL, LDLDIRECT in the last 72 hours. Thyroid function studies No results for input(s): TSH, T4TOTAL, T3FREE, THYROIDAB in the last 72 hours.  Invalid input(s): FREET3 Anemia work up No results for  input(s): VITAMINB12, FOLATE, FERRITIN, TIBC, IRON, RETICCTPCT in the last 72 hours. Urinalysis    Component Value Date/Time   COLORURINE YELLOW 03/10/2020 1400   APPEARANCEUR HAZY (A) 03/10/2020 1400   LABSPEC 1.018 03/10/2020 1400   PHURINE 6.0 03/10/2020 1400   GLUCOSEU NEGATIVE 03/10/2020 1400   HGBUR NEGATIVE 03/10/2020 1400   BILIRUBINUR NEGATIVE 03/10/2020 1400   KETONESUR NEGATIVE 03/10/2020 1400   PROTEINUR NEGATIVE 03/10/2020 1400   UROBILINOGEN 1.0 10/28/2008 0458   NITRITE POSITIVE (A) 03/10/2020 1400   LEUKOCYTESUR TRACE (A) 03/10/2020 1400   Sepsis Labs Invalid input(s): PROCALCITONIN,  WBC,  LACTICIDVEN Microbiology Recent Results (from the past 240 hour(s))  Blood Culture (routine x 2)     Status: None   Collection Time: 03/10/20 12:15 PM  Specimen: BLOOD LEFT FOREARM  Result Value Ref Range Status   Specimen Description BLOOD LEFT FOREARM  Final   Special Requests   Final    BOTTLES DRAWN AEROBIC AND ANAEROBIC Blood Culture adequate volume   Culture   Final    NO GROWTH 5 DAYS Performed at [facility], [address], [place], [place] [address]    Report Status 03/15/2020 FINAL  Final  Blood Culture (routine x 2)     Status: None   Collection Time: 03/10/20 12:15 PM   Specimen: BLOOD RIGHT FOREARM  Result Value Ref Range Status   Specimen Description   Final    BLOOD RIGHT FOREARM Performed at [facility], [place] [address], [place], [place] [address]    Special Requests   Final    BOTTLES DRAWN AEROBIC AND ANAEROBIC Blood Culture results may not be optimal due to an inadequate volume of blood received in culture bottles Performed at [facility] [address], [place], [place] [address]    Culture   Final    NO GROWTH 5 DAYS Performed at [facility], [place] [address], [place], [place] [address]    Report Status 03/15/2020 FINAL  Final  Resp Panel by RT-PCR (Flu A&B, Covid) Nasopharyngeal Swab     Status: None    Collection Time: 03/10/20  1:00 PM   Specimen: Nasopharyngeal Swab; Nasopharyngeal(NP) swabs in vial transport medium  Result Value Ref Range Status   SARS Coronavirus 2 by RT PCR NEGATIVE NEGATIVE Final    Comment: (NOTE) SARS-CoV-2 target nucleic acids are NOT DETECTED.  The SARS-CoV-2 RNA is generally detectable in upper respiratory specimens during the acute phase of infection. The lowest concentration of SARS-CoV-2 viral copies this assay can detect is 138 copies/mL. A negative result does not preclude SARS-Cov-2 infection and should not be used as the sole basis for treatment or other patient management decisions. A negative result may occur with  improper specimen collection/handling, submission of specimen other than nasopharyngeal swab, presence of viral mutation(s) within the areas targeted by this assay, and inadequate number of viral copies(<138 copies/mL). A negative result must be combined with clinical observations, patient history, and epidemiological information. The expected result is Negative.  Fact Sheet for Patients:  EntrepreneurPulse.com.au  Fact Sheet for Healthcare Providers:  IncredibleEmployment.be  This test is no t yet approved or cleared by the [place] FDA and  has been authorized for detection and/or diagnosis of SARS-CoV-2 by FDA under an Emergency Use Authorization (EUA). This EUA will remain  in effect (meaning this test can be used) for the duration of the COVID-19 declaration under Section 564(b)(1) of the Act, 21 U.S.C.section 360bbb-3(b)(1), unless the authorization is terminated  or revoked sooner.       Influenza A by PCR NEGATIVE NEGATIVE Final   Influenza B by PCR NEGATIVE NEGATIVE Final    Comment: (NOTE) The Xpert Xpress SARS-CoV-2/FLU/RSV plus assay is intended as an aid in the diagnosis of influenza from Nasopharyngeal swab specimens and should not be used as a sole basis for treatment.  Nasal washings and aspirates are unacceptable for Xpert Xpress SARS-CoV-2/FLU/RSV testing.  Fact Sheet for Patients: EntrepreneurPulse.com.au  Fact Sheet for Healthcare Providers: IncredibleEmployment.be  This test is not yet approved or cleared by the [place] FDA and has been authorized for detection and/or diagnosis of SARS-CoV-2 by FDA under an Emergency Use Authorization (EUA). This EUA will remain in effect (meaning this test can be  used) for the duration of the COVID-19 declaration under Section 564(b)(1) of the Act, 21 U.S.C. section 360bbb-3(b)(1), unless the authorization is terminated or revoked.  Performed at [facility], [place] [address] [place], [place] [address]   Urine culture     Status: None   Collection Time: 03/10/20  2:00 PM   Specimen: In/Out Cath Urine  Result Value Ref Range Status   Specimen Description   Final    IN/OUT CATH URINE Performed at [facility] [address], [place], [place] [address]    Special Requests   Final    NONE Performed at [facility], [place] [address] [place], [place] [address]    Culture   Final    NO GROWTH Performed at [facility], [place] [address] [place], [place] [address]    Report Status 03/12/2020 FINAL  Final  MRSA PCR Screening     Status: None   Collection Time: 03/10/20  8:38 PM   Specimen: Nasopharyngeal  Result Value Ref Range Status   MRSA by PCR NEGATIVE NEGATIVE Final    Comment:        The GeneXpert MRSA Assay (FDA approved for NASAL specimens only), is one component of a comprehensive MRSA colonization surveillance program. It is not intended to diagnose MRSA infection nor to guide or monitor treatment for MRSA infections. Performed at [facility], [place] [address], [place], [place] [address]     Please note: You were cared for by a hospitalist during your hospital stay.  Once you are discharged, your primary care physician will handle any further medical issues. Please note that NO REFILLS for any discharge medications will be authorized once you are discharged, as it is imperative that you return to your primary care physician (or establish a relationship with a primary care physician if you do not have one) for your post hospital discharge needs so that they can reassess your need for medications and monitor your lab values.    Time coordinating discharge: 40 minutes  SIGNED:   Shelly Coss, MD  [facility] 03/18/2020, 11:10 AM Pager [phone]  If 7PM-7AM, please contact night-coverage [email or web address] Password [email or web address]

## 2020-03-17 NOTE — Plan of Care (Signed)
  Problem: Pain Managment: Goal: General experience of comfort will improve Outcome: Progressing   Problem: Safety: Goal: Ability to remain free from injury will improve Outcome: Progressing   Problem: Nutrition: Goal: Adequate nutrition will be maintained Outcome: Progressing   

## 2020-03-17 NOTE — Progress Notes (Signed)
I spoke with somebody from radiation oncology this morning who was questioning if it was still necessary to proceed with tracheostomy.  I then spoke with the nurse on the inpatient unit who talked to the patient and the patient said he does not want the tracheostomy at this point.  I am available in the future if any airway issues arise we can do the tracheostomy at any time.  I will follow as an outpatient.

## 2020-03-17 NOTE — Progress Notes (Addendum)
As per Dr Smith Mince for trach canceled because patient declined.And patient is interested on hospice at home. After discussion with sister,she wants to know more about hospice options: residential vs home with hospice.She says her brother doesn't have good home environment for hospice. I have alerted case manager about referral for hospice agency.

## 2020-03-17 NOTE — Progress Notes (Addendum)
HEMATOLOGY-ONCOLOGY PROGRESS NOTE  SUBJECTIVE: The patient denies shortness of breath today.  Denies pain today  PHYSICAL EXAMINATION:  Vitals:   03/16/20 2125 03/17/20 0525  BP: 102/72 117/79  Pulse: 99 89  Resp: 19 19  Temp: 97.8 F (36.6 C) 98 F (36.7 C)  SpO2: 100% 98%   Filed Weights   03/10/20 1042 03/12/20 1748  Weight: 50 kg 49.8 kg    Intake/Output from previous day: 01/12 0701 - 01/13 0700 In: 0  Out: 1301 [Urine:1300; Stool:1]  GENERAL: Awake and alert, cachectic, no distress SKIN: skin color, texture, turgor are normal, no rashes or significant lesions LUNGS: Rhonchi present bilaterally HEART: regular rate & rhythm and no murmurs and no lower extremity edema ABDOMEN:abdomen soft, non-tender and normal bowel sounds. Left abdomen feeding tube.    LABORATORY DATA:  I have reviewed the data as listed CMP Latest Ref Rng & Units 03/17/2020 03/16/2020 03/15/2020  Glucose 70 - 99 mg/dL 202(H) 189(H) 178(H)  BUN 8 - 23 mg/dL _0 Creatinine 0.61 - 1.24 mg/dL 0.59(L) 0.57(L) 0.61  Sodium 135 - 145 mmol/L 143 142 145  Potassium 3.5 - 5.1 mmol/L 3.8 3.3(L) 3.6  Chloride 98 - 111 mmol/L 103 101 107  CO2 22 - 32 mmol/L _1 Calcium 8.9 - 10.3 mg/dL 8.5(L) 8.2(L) 8.5(L)  Total Protein 6.5 - 8.1 g/dL - - -  Total Bilirubin 0.3 - 1.2 mg/dL - - -  Alkaline Phos 38 - 126 U/L - - -  AST 15 - 41 U/L - - -  ALT 0 - 44 U/L - - -    Lab Results  Component Value Date   WBC 14.1 (H) 03/16/2020   HGB 9.2 (L) 03/16/2020   HCT 29.0 (L) 03/16/2020   MCV 74.6 (L) 03/16/2020   PLT 330 03/16/2020   NEUTROABS 13.4 (H) 03/16/2020    DG Chest 2 View  Result Date: 03/10/2020 CLINICAL DATA:  History of lung carcinoma.  Shortness of breath EXAM: CHEST - 2 VIEW COMPARISON:  Chest CT November 02, 2019; chest radiograph August 20, 2019. PET-CT November 26, 2019 FINDINGS: There is opacity in the posterior left base at the site of known mass. Mass is ill-defined by current  radiographic examination with suspected surrounding ill-defined pneumonitis. Elsewhere no edema or airspace opacity evident. Heart size and pulmonary vascular normal. No adenopathy. No bone lesions. IMPRESSION: Airspace opacity posterior left base, likely due to combination of mass and surrounding pneumonitis. Lungs elsewhere clear. Heart size normal. No adenopathy evident. Electronically Signed   By: Lowella Grip III M.D.   On: 03/10/2020 10:26   CT SOFT TISSUE NECK WO CONTRAST  Result Date: 03/14/2020 CLINICAL DATA:  [place].  Head and neck cancer. EXAM: CT NECK WITHOUT CONTRAST TECHNIQUE: Multidetector CT imaging of the neck was performed following the standard protocol without intravenous contrast. COMPARISON:  CT neck 11/02/2019 Head CT 11/26/2019 FINDINGS: Pharynx and larynx: Lack of intravenous contrast agent and marked motion at the level of the epiglottis and larynx limits study. Within that limitation, there is circumferential mucosal thickening of the supraglottic larynx with narrowing of the transverse dimension of the airway to 3 mm. There is no retropharyngeal collection. The pharynx and oral cavity are unremarkable. Salivary glands: No inflammation, mass, or stone. Thyroid: Normal. Lymph nodes: None enlarged or abnormal density. Vascular: Atherosclerotic calcification of the internal carotid arteries. Limited intracranial: Negative. Visualized orbits: Negative. Mastoids and visualized paranasal sinuses: Clear. Skeleton: No acute or aggressive process.  Upper chest: Please see dedicated report for concomitant chest CT. Other: None. IMPRESSION: 1. Lack of intravenous contrast agent and marked motion at the level of the epiglottis and larynx limits study. 2. Circumferential mucosal thickening of the supraglottic larynx with narrowing of the transverse dimension of the airway to 3 mm. Laryngeal thickening is markedly worsened compared to 11/02/2019. New/recurrent tumor or post radiation change  could both account for this appearance. 3. No cervical lymphadenopathy. 4. Please see report for concomitant CT of the chest for discussion of left lung findings. Aortic Atherosclerosis (ICD10-I70.0). Electronically Signed   By: Ulyses Jarred M.D.   On: 03/14/2020 20:37   CT CHEST WO CONTRAST  Result Date: 03/15/2020 CLINICAL DATA:  Inpatient. 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 EXAM: CT CHEST WITHOUT CONTRAST TECHNIQUE: Multidetector CT imaging of the chest was performed following the standard protocol without IV contrast. COMPARISON:  03/10/2020 chest CT angiogram. 11/26/2019 PET-CT. 11/02/2027 chest CT. FINDINGS: Cardiovascular: Normal heart size. No significant pericardial effusion/thickening. Left anterior descending coronary atherosclerosis. Great vessels are normal in course and caliber. Mediastinum/Nodes: No discrete thyroid nodules. Mildly patulous thoracic esophagus containing a small amount of oral contrast. No axillary adenopathy. Mild AP window adenopathy up to 1.1 cm (series 1/image 64), new. No discrete hilar adenopathy on these noncontrast images. Lungs/Pleura: No pneumothorax. Small dependent left pleural effusion. No right pleural effusion. Moderate centrilobular and paraseptal emphysema with mild diffuse bronchial wall thickening. Patchy debris throughout left lung airways. Dense patchy consolidation and volume loss throughout the lingula and left lower lobe with scattered air bronchograms, similar to 03/10/2020 chest CT angiogram study, obscuring the left lower lobe mass seen on 11/02/2019 chest CT. Multiple (at least 5) solid pulmonary nodules scattered throughout the right lung, all new or increased since 11/02/2019 chest CT and unchanged since recent 03/10/2020 chest CT angiogram study. Representative 10 mm right upper lobe nodule (series 5/image 55), increased from 3 mm on  11/02/2019 CT. Representative anterior right lower lobe 8 mm nodule (series 5/image 107), new. Upper abdomen: No acute abnormality. Musculoskeletal: No aggressive appearing focal osseous lesions. Mild thoracic spondylosis. IMPRESSION: 1. Patchy debris throughout the left lung airways. Dense patchy consolidation and volume loss throughout the lingula and left lower lobe with scattered air bronchograms, similar to recent 03/11/2019 chest CT angiogram study, obscuring the left lower lobe mass seen on 11/02/2019 chest CT. Findings could represent any combination of aspiration, pneumonia, atelectasis, evolving postradiation change and/or residual tumor. 2. Multiple (at least 5) solid pulmonary nodules scattered throughout the right lung, all new or increased since 11/02/2019 chest CT and unchanged since recent 03/11/2019 chest CT. 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 3. New mild AP window adenopathy, nonspecific, cannot exclude nodal metastasis versus reactive etiology. Attention on follow-up chest CT advised. 4. Small dependent left pleural effusion. 5. One vessel coronary atherosclerosis. 6. Aortic Atherosclerosis (ICD10-I70.0) and Emphysema (ICD10-J43.9). Electronically Signed   By: Ilona Sorrel M.D.   On: 03/15/2020 09:19   CT Angio Chest PE W and/or Wo Contrast  Result Date: 03/10/2020 CLINICAL DATA:  Known lung cancer currently undergoing radiation therapy (last yesterday). Sepsis, tachycardia and hypoxia. COVID test was negative. Evaluate for pneumonia versus pulmonary embolus. EXAM: CT ANGIOGRAPHY CHEST WITH CONTRAST TECHNIQUE: Multidetector CT imaging of the chest was performed using the standard protocol during bolus administration of intravenous contrast. Multiplanar CT image reconstructions and MIPs were obtained to evaluate the vascular anatomy.  CONTRAST:  148m OMNIPAQUE IOHEXOL 350 MG/ML SOLN COMPARISON:  Recent PET-CT 11/26/2019; CT biopsy 02/11/2020 FINDINGS:  Cardiovascular: Satisfactory opacification of the pulmonary arteries to the segmental level. No evidence of pulmonary embolism. Normal heart size. No pericardial effusion. Coronary artery calcifications. Mediastinum/Nodes: No enlarged mediastinal, hilar, or axillary lymph nodes. Thyroid gland, trachea, and esophagus demonstrate no significant findings. Lungs/Pleura: Enlarging right upper lobe pulmonary nodule measures up to 0.9 cm and is highly concerning for metastatic disease. Enlarging nodule within the superior segment of the right lower lobe now measures 0.5 cm. Enlarging right lower lobe pulmonary nodule now measuring up to 0.7 cm. Interval development of extensive consolidative airspace opacities throughout the left lower lobe with associated volume loss. The left mainstem bronchus is completely obscured and may be stenosed or impacted with debris. The previously identified mass can be vaguely identified as low attenuation in the center of the consolidated left lower lobe. Background of centrilobular pulmonary emphysema. No pleural effusion. Upper Abdomen: No acute abnormality. Percutaneous gastrostomy tube in place. Musculoskeletal: No chest wall abnormality. No acute or significant osseous findings. Review of the MIP images confirms the above findings. IMPRESSION: 1. Negative for acute pulmonary embolus. 2. Interval development of dense consolidation and associated volume loss within the left lower lobe. The left lower lobe bronchus is also completely obscured and is likely impacted with debris. Findings are most concerning for lobar pneumonia. Acute radiation pneumonitis is possible but considered less likely given the timing (radiation therapy is still on going). 3. At least 3 enlarging pulmonary nodules are identified in the right upper and lower lobes. These were barely identifiable as punctate foci on the prior PET-CT. Enlargement over the past 3 months is highly concerning for metastatic disease. 4.  Coronary artery calcifications. 5. Percutaneous gastrostomy tube in place. 6. Centrilobular pulmonary emphysema. Emphysema (ICD10-J43.9). Electronically Signed   By: HJacqulynn CadetM.D.   On: 03/10/2020 17:23   [place] (PLEURAL EFFUSION)  Result Date: 03/15/2020 CLINICAL DATA:  65year old male referred for possible thoracentesis EXAM: CHEST ULTRASOUND COMPARISON:  CT 03/14/2020 FINDINGS: Ultrasound performed demonstrating scant pleural fluid on the left, with no pleural fluid on the right. Thoracentesis not performed at this time. IMPRESSION: Scant left-sided pleural fluid with no adequate window for aspiration. Electronically Signed   By: JCorrie MckusickD.O.   On: 03/15/2020 12:26   DG CHEST PORT 1 VIEW  Result Date: 03/14/2020 CLINICAL DATA:  Shortness of breath. EXAM: PORTABLE CHEST 1 VIEW COMPARISON:  CT chest and chest radiograph 03/10/2020. FINDINGS: Leftward shift of the heart mediastinum. Increasing collapse/consolidation in the left upper and left lower lobes. Large left pleural effusion. Apparent cut off of the left mainstem bronchus. Right lung is clear. Retained oral contrast is seen in the esophagus and stomach. IMPRESSION: Worsening collapse/consolidation in the lingula and left lower lobe with leftward shift of the heart and mediastinum, findings indicative of atelectasis/pneumonia due to worsening mucous plugging. Aspiration is not excluded. Electronically Signed   By: MLorin PicketM.D.   On: 03/14/2020 15:39   DG Swallowing Func-Speech Pathology  Result Date: 03/14/2020 Objective Swallowing Evaluation: Type of Study: MBS-Modified Barium Swallow Study  Patient Details Name: RRiyaan HerouxMRN: [id number] of Birth: 211/24/1957Today's Date: 03/14/2020 Time: SLP Start Time (ACUTE ONLY): 1450 -SLP Stop Time (ACUTE ONLY): 1505 SLP Time Calculation (min) (ACUTE ONLY): 15 min Past Medical History: Past Medical History: Diagnosis Date . [place]  . COPD (chronic obstructive pulmonary  disease) [place]  . Medical history  non-contributory  Past Surgical History: Past Surgical History: Procedure Laterality Date . BIOPSY  08/20/2019  Procedure: BIOPSY;  Surgeon: Chesley Mires, MD;  Location: [facility];  Service: Endoscopy;; . BRONCHIAL WASHINGS  08/20/2019  Procedure: BRONCHIAL WASHINGS;  Surgeon: Chesley Mires, MD;  Location: [facility];  Service: Endoscopy;;  LLL BAL . DIRECT LARYNGOSCOPY N/A 07/09/2019  Procedure: DIRECT LARYNGOSCOPY WITH BIOPSY;  Surgeon: Jerrell Belfast, MD;  Location: [facility];  Service: ENT;  Laterality: N/A; . IR FLUORO RM 30-60 MIN  07/13/2019 . IR GASTROSTOMY TUBE MOD SED  07/14/2019 . IR REPLACE G-TUBE SIMPLE WO FLUORO  10/09/2019 . NO PAST SURGERIES   . TRACHEOSTOMY TUBE PLACEMENT N/A 07/09/2019  Procedure: TRACHEOSTOMY;  Surgeon: Jerrell Belfast, MD;  Location: [facility];  Service: ENT;  Laterality: N/A; . VIDEO BRONCHOSCOPY Left 08/20/2019  Procedure: VIDEO BRONCHOSCOPY WITH FLUORO;  Surgeon: Chesley Mires, MD;  Location: [facility];  Service: Endoscopy;  Laterality: Left; HPI: Patient is a 65 y.o. male with PMH: metastatic lung cancer (just started round of XRT), COPD currently at [facility], who was found to be dyspneic and hypoxic and placed on 2L [place] and improved. He has had a couple days history of cough, increased sputnum producdtion and fevers so he was brought to ED.  CXR revealed Worsening collapse/consolidation in the lingula and left lower lobewith leftward shift of the heart and mediastinum, findings indicative of atelectasis/pneumonia due to worsening mucous plugging. Aspiration is not excluded, COVID negative. Patient was seen by speech therapy during previous admission in June of 2021 for dysphagia.  Subjective: pleasant, cooperative Assessment / Plan / Recommendation CHL IP CLINICAL IMPRESSIONS 03/14/2020 Clinical Impression Patient presents with a moderate oral and mod-severe pharyngeal dysphagia. As compared to previous MBS in June of 2020, swallow function has  improved. He exhibited moderate vallecular residuals, mild pyriform and posterior pharyngeal wall residuals with puree solids but chin tuck did help clear this. Thin liquids and nectar thick liquids both resulted in diffuse residuals in pharynx, frequent penetration during and after swallow. In addition, esophageal backflow of boluses  were observed with thin/nectar liquids moving back into pharynx and with thin liquids, into laryngeal vestibule. Patient exhibited some belching and this was followed by him regurgitating a significant amount of barium mixed with secretions (this occured twice during study). Although aspiration was not observed, patient exhibited frequent penetration on residuals, which were never fully cleared from pharynx. In addition, esophageal backflow into pharynx and laryngeal vestibule also put patient at high risk of aspiration. SLP Visit Diagnosis -- Attention and concentration deficit following -- Frontal lobe and executive function deficit following -- Impact on safety and function --   CHL IP TREATMENT RECOMMENDATION 03/14/2020 Treatment Recommendations Therapy as outlined in treatment plan below   Prognosis 03/14/2020 Prognosis for Safe Diet Advancement Fair Barriers to Reach Goals Time post onset;Severity of deficits Barriers/Prognosis Comment -- CHL IP DIET RECOMMENDATION 03/14/2020 SLP Diet Recommendations NPO Liquid Administration via -- Medication Administration Via alternative means Compensations -- Postural Changes Seated upright at 90 degrees   CHL IP OTHER RECOMMENDATIONS 03/14/2020 Recommended Consults -- Oral Care Recommendations Oral care QID Other Recommendations --   CHL IP FOLLOW UP RECOMMENDATIONS 03/14/2020 Follow up Recommendations Skilled Nursing facility;24 hour supervision/assistance   CHL IP FREQUENCY AND DURATION 03/14/2020 Speech Therapy Frequency (ACUTE ONLY) min 2x/week Treatment Duration 1 week      CHL IP ORAL PHASE 03/14/2020 Oral Phase Impaired Oral - Pudding  Teaspoon -- Oral - Pudding Cup -- Oral -  Honey Teaspoon -- Oral - Honey Cup -- Oral - Nectar Teaspoon -- Oral - Nectar Cup -- Oral - Nectar Straw -- Oral - Thin Teaspoon Reduced posterior propulsion;Delayed oral transit;Weak lingual manipulation Oral - Thin Cup Lingual pumping;Delayed oral transit;Reduced posterior propulsion;Weak lingual manipulation Oral - Thin Straw -- Oral - Puree Delayed oral transit;Piecemeal swallowing;Reduced posterior propulsion;Weak lingual manipulation;Lingual pumping Oral - Mech Soft -- Oral - Regular -- Oral - Multi-Consistency -- Oral - Pill -- Oral Phase - Comment --  CHL IP PHARYNGEAL PHASE 03/14/2020 Pharyngeal Phase Impaired Pharyngeal- Pudding Teaspoon -- Pharyngeal -- Pharyngeal- Pudding Cup -- Pharyngeal -- Pharyngeal- Honey Teaspoon -- Pharyngeal -- Pharyngeal- Honey Cup -- Pharyngeal -- Pharyngeal- Nectar Teaspoon -- Pharyngeal -- Pharyngeal- Nectar Cup Delayed swallow initiation-vallecula;Reduced pharyngeal peristalsis;Reduced laryngeal elevation;Pharyngeal residue - valleculae;Pharyngeal residue - pyriform;Pharyngeal residue - posterior pharnyx;Penetration/Aspiration during swallow;Penetration/Apiration after swallow Pharyngeal Material enters airway, remains ABOVE vocal cords and not ejected out Pharyngeal- Nectar Straw -- Pharyngeal -- Pharyngeal- Thin Teaspoon Reduced airway/laryngeal closure;Delayed swallow initiation-vallecula;Reduced laryngeal elevation;Penetration/Apiration after swallow;Penetration/Aspiration during swallow;Pharyngeal residue - valleculae;Pharyngeal residue - pyriform;Pharyngeal residue - posterior pharnyx Pharyngeal Material enters airway, remains ABOVE vocal cords and not ejected out Pharyngeal- Thin Cup Delayed swallow initiation-vallecula;Reduced airway/laryngeal closure;Reduced laryngeal elevation;Penetration/Aspiration during swallow;Penetration/Apiration after swallow;Pharyngeal residue - valleculae;Pharyngeal residue - pyriform;Pharyngeal  residue - posterior pharnyx Pharyngeal Material enters airway, remains ABOVE vocal cords and not ejected out Pharyngeal- Thin Straw -- Pharyngeal -- Pharyngeal- Puree Delayed swallow initiation-vallecula;Pharyngeal residue - valleculae;Pharyngeal residue - pyriform;Pharyngeal residue - posterior pharnyx Pharyngeal -- Pharyngeal- Mechanical Soft -- Pharyngeal -- Pharyngeal- Regular -- Pharyngeal -- Pharyngeal- Multi-consistency -- Pharyngeal -- Pharyngeal- Pill -- Pharyngeal -- Pharyngeal Comment --  CHL IP CERVICAL ESOPHAGEAL PHASE 03/14/2020 Cervical Esophageal Phase Impaired Pudding Teaspoon -- Pudding Cup -- Honey Teaspoon -- Honey Cup -- Nectar Teaspoon -- Nectar Cup Esophageal backflow into the pharynx;Reduced cricopharyngeal relaxation Nectar Straw -- Thin Teaspoon -- Thin Cup Esophageal backflow into the pharynx;Reduced cricopharyngeal relaxation;Esophageal backflow into the larynx Thin Straw -- Puree Reduced cricopharyngeal relaxation Mechanical Soft -- Regular -- Multi-consistency -- Pill -- Cervical Esophageal Comment -- Sonia Baller, MA, CCC-SLP Speech Therapy              ASSESSMENT AND PLAN: 1.Laryngeal cancer -07/08/2019 CT of the neck showed enhancing hypopharyngeal soft tissue suspicious for malignancy -07/09/2019 tracheostomy placement and biopsy, tracheostomy decannulation 08/26/2019 -07/09/2019 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 No cervical lymphadenopathy.  2. Left lower lobe of the lung mass concerning for primary neoplasm versus metastatic disease -07/07/2019 CT angiogram of the chest 2.7 cm mass  in the left lower lobe of the lung -CT 08/17/2019-persistent low-attenuation lesion in the superior segment of left lower lobe with surrounding consolidation/collapse, stable from 07/07/2019, consolidation and groundglass opacity in the left lower lobe have largely resolved -08/20/2019 bronchoscopy-biopsy nondiagnostic -CT chest 11/02/2019-, increased in size concerning for a primary lung malignancy versus a metastasis versus pulmonary abscess, improved left lower lobe aeration, COPD             -PET scan 11/26/2019-3.7 cm left lower lobe pulmonary mass, centrally necrotic and markedly hypermetabolic.  Second tiny 7 mm irregular nodular opacity in the superior segment left lower lobe shows low-level FDG accumulation.  Low-level uptake in an enlarged AP window lymph  node.  Mild hypermetabolism in a normal-appearing left axillary lymph node, felt to be reactive.  Bony uptake identified in the pelvis without underlying lesions evident by CT.           -CT biopsy of left lung mass 02/11/2020-squamous cell carcinoma; PDL1 TPS 0%  -CT angio chest 03/10/20-negative acute PE. Interval development dense consolidation and associated volume loss within the LLL. LLL bronchus completely obscured and likely impacted with debris. At least 3 enlarging pulmonary nodules in the right upper and lower lobes.   -Neck CT 03/14/20-circumferential mucosal thickening supraglottic larynx with narrowing of transverse dimension of airway to 3 mm. No cervical lymphadenopathy.   -CT chest 03/14/20-dense patchy consolidation and volume loss throughout the lingula and LLL with scattered air bronchograms, obscuring LLL mass seen on 11/02/2019 chest CT. Multiple solid pulmonary nodules scattered throughout right lung all new or increased since 11/02/2019. New mild AP window adenopathy.  -SBRT left lung mass 03/02/2020, 03/08/2020, 03/14/2020, 03/15/2020 3. Cachexia 4. History of aspiration pneumonia 5. Mild  anemia 6. Atrial fibrillation with RVR 6. COPD 8. Alcohol abuse 9. Tobacco dependence 10. Cocaine abuse 11. Odynophagia secondary to radiation-improved 12.  Hospital admission 03/10/2020-sepsis secondary to UTI, pneumonia  Devon Peterson appears unchanged.  He has been evaluated by ENT due to respiratory distress and stridor over the past few days.  They do not recommend tracheostomy.  His symptoms have improved with dexamethasone.  He has evidence of metastatic disease in the lung.  He is not a candidate for systemic chemotherapy due to poor performance status or immunotherapy due to low PD-L1.  Recommend hospice.  Was discussed with his sister and the patient and his sister are agreeable to home with hospice.  CPR and ACLS issues were discussed with the patient and he would like to remain a full code for now.  Recommendations: 1.  Antibiotics per hospitalist for treatment of UTI and pneumonia. 2.  Continue radiation under the care of Dr. Consepcion Hearing to discontinue radiation after treatment tomorrow 3. continue Decadron, airway management per ENT and pulmonary medicine 4.  TOC consult placed for referral to [place].   LOS: 7 days   Mikey Bussing 03/17/20 Devon Peterson was interviewed and examined.  He appears comfortable and does not have stridor today.  I discussed treatment options with Devon Peterson again today.  I reviewed the case with Dr. Isidore Moos yesterday.  He will complete a final treatment with radiation tomorrow.  He appears to have metastatic disease from either the laryngeal cancer or a left lung primary.  No therapy will be curative.  I recommend hospice care.  He is in agreement.  He would like to go home and live with his brother.  I was present for greater than 50% of the visit today and performed medical decision making

## 2020-03-18 ENCOUNTER — Ambulatory Visit
Admission: RE | Admit: 2020-03-18 | Discharge: 2020-03-18 | Disposition: A | Discharge status: Home / Self Care | Payer: Medicaid Other | Source: Ambulatory Visit | Attending: Radiation Oncology | Admitting: Radiation Oncology

## 2020-03-18 DIAGNOSIS — N390 Urinary tract infection, site not specified: Secondary | ICD-10-CM | POA: Diagnosis not present

## 2020-03-18 DIAGNOSIS — C329 Malignant neoplasm of larynx, unspecified: Secondary | ICD-10-CM | POA: Diagnosis not present

## 2020-03-18 DIAGNOSIS — C7802 Secondary malignant neoplasm of left lung: Secondary | ICD-10-CM | POA: Diagnosis not present

## 2020-03-18 DIAGNOSIS — J189 Pneumonia, unspecified organism: Secondary | ICD-10-CM | POA: Diagnosis not present

## 2020-03-18 LAB — GLUCOSE, CAPILLARY
Glucose-Capillary: 180 mg/dL — ABNORMAL HIGH (ref 70–99)
Glucose-Capillary: 133 mg/dL — ABNORMAL HIGH (ref 70–99)
Glucose-Capillary: 135 mg/dL — ABNORMAL HIGH (ref 70–99)
Glucose-Capillary: 143 mg/dL — ABNORMAL HIGH (ref 70–99)

## 2020-03-18 LAB — SARS CORONAVIRUS 2 (TAT 6-24 HRS): SARS Coronavirus 2: NEGATIVE

## 2020-03-18 MED ORDER — AMOXICILLIN-POT CLAVULANATE 875-125 MG PO TABS: 1.0000 | ORAL_TABLET | Freq: Two times a day (BID) | ORAL | 0 refills | Status: DC

## 2020-03-18 MED ORDER — IPRATROPIUM-ALBUTEROL 20-100 MCG/ACT IN AERS
1.0000 | INHALATION_SPRAY | Freq: Four times a day (QID) | RESPIRATORY_TRACT | Status: AC | PRN
Start: 2020-03-18 — End: ?

## 2020-03-18 MED ORDER — DEXAMETHASONE 2 MG PO TABS: 2.0000 mg | ORAL_TABLET | Freq: Every day | ORAL | 0 refills | Status: DC

## 2020-03-18 MED ORDER — DEXAMETHASONE 2 MG PO TABS: 2.0000 mg | ORAL_TABLET | Freq: Two times a day (BID) | ORAL | 0 refills | Status: DC

## 2020-03-18 NOTE — TOC Transition Note (Signed)
Transition of Care [facility]) - CM/SW Discharge Note   Patient Details  Name: Chett Taniguchi MRN: [id number] Date of Birth: 12/11/1955  Transition of Care [facility]) CM/SW Contact:  Dessa Phi, RN Phone Number: 03/18/2020, 1:05 PM   Clinical Narrative: Per [place] patient can return back today-going to [place], nsg call report tel#[phone]-Nsg to manage [place] transport-since still awaiting covid results. Sister [name] prefer to be notified when transport is picking patient up-Nsg notified. No further CM needs.      Final next level of care: Skilled Nursing Facility Barriers to Discharge: No Barriers Identified   Patient Goals and CMS Choice Patient states their goals for this hospitalization and ongoing recovery are:: return back to [facility] Medicare.gov Compare Post Acute Care list provided to:: Patient Represenative (must comment) Choice offered to / list presented to : [place] / [facility]  Discharge Placement              Patient chooses bed at: [facility] Patient to be transferred to facility by: [facility] Transport Name of family member notified: [name] sister [phone] Patient and family notified of of transfer: 03/18/20  Discharge Plan and Services   Discharge Planning Services: CM Consult                                 Social Determinants of Health ([place]) Interventions     Readmission Risk Interventions Readmission Risk Prevention Plan 07/27/2019  Transportation Screening Complete  HRI or Home Care Consult Complete  Social Work Consult for [place] Planning/Counseling Complete  Palliative Care Screening Complete  Medication Review Press photographer) Complete  Some recent data might be hidden

## 2020-03-18 NOTE — Plan of Care (Signed)
  Problem: Education: Goal: Knowledge of General Education information will improve Description: Including pain rating scale, medication(s)/side effects and non-pharmacologic comfort measures Outcome: Progressing   Problem: Clinical Measurements: Goal: Will remain free from infection Outcome: Progressing Goal: Respiratory complications will improve Outcome: Progressing

## 2020-03-18 NOTE — Progress Notes (Signed)
Report called to [place] at [facility].  IV removed - WNL.  Patient aware of discharge this evening and dressed in transfer gown.  AVS placed in packet to be transported with him once transport arrives.  Patient in NAD at this time awaiting pick up

## 2020-03-18 NOTE — TOC Progression Note (Signed)
Transition of Care [facility]) - Progression Note    Patient Details  Name: Devon Peterson MRN: [id number] Date of Birth: 22-Feb-1956  Transition of Care [facility]) CM/SW Contact  Herby Amick, Juliann Pulse, RN Phone Number: 03/18/2020, 11:28 AM  Clinical Narrative:Spoke to [place] for SNF w/PCS([facility] care aware & following)-sister [name] in agreement. PTAR @ d/c.       Expected Discharge Plan: Skilled Nursing Facility Barriers to Discharge: Insurance Authorization  Expected Discharge Plan and Services Expected Discharge Plan: [place]   Discharge Planning Services: CM Consult   Living arrangements for the past 2 months: [place] Expected Discharge Date: 03/19/20                                     Social Determinants of Health (SDOH) Interventions    Readmission Risk Interventions Readmission Risk Prevention Plan 07/27/2019  Transportation Screening Complete  HRI or Home Care Consult Complete  Social Work Consult for [place] Planning/Counseling Complete  Palliative Care Screening Complete  Medication Review Press photographer) Complete  Some recent data might be hidden

## 2020-03-18 NOTE — Plan of Care (Signed)
  Problem: Health Behavior/Discharge Planning: Goal: Ability to manage health-related needs will improve Outcome: Progressing   Problem: Nutrition: Goal: Adequate nutrition will be maintained Outcome: Progressing   Problem: Pain Managment: Goal: General experience of comfort will improve Outcome: Progressing   

## 2020-03-18 NOTE — Plan of Care (Signed)
  Problem: Education: Goal: Knowledge of General Education information will improve Description: Including pain rating scale, medication(s)/side effects and non-pharmacologic comfort measures 03/18/2020 1407 by Llana Aliment, RN Outcome: Adequate for Discharge 03/18/2020 1135 by Llana Aliment, RN Outcome: Progressing   Problem: Health Behavior/Discharge Planning: Goal: Ability to manage health-related needs will improve Outcome: Adequate for Discharge   Problem: Clinical Measurements: Goal: Ability to maintain clinical measurements within normal limits will improve Outcome: Adequate for Discharge Goal: Will remain free from infection 03/18/2020 1407 by Llana Aliment, RN Outcome: Adequate for Discharge 03/18/2020 1136 by Llana Aliment, RN Outcome: Progressing Goal: Diagnostic test results will improve Outcome: Adequate for Discharge Goal: Respiratory complications will improve 03/18/2020 1407 by Llana Aliment, RN Outcome: Adequate for Discharge 03/18/2020 1136 by Llana Aliment, RN Outcome: Progressing Goal: Cardiovascular complication will be avoided Outcome: Adequate for Discharge   Problem: Activity: Goal: Risk for activity intolerance will decrease Outcome: Adequate for Discharge   Problem: Nutrition: Goal: Adequate nutrition will be maintained Outcome: Adequate for Discharge   Problem: Coping: Goal: Level of anxiety will decrease Outcome: Adequate for Discharge   Problem: Elimination: Goal: Will not experience complications related to bowel motility Outcome: Adequate for Discharge Goal: Will not experience complications related to urinary retention Outcome: Adequate for Discharge   Problem: Pain Managment: Goal: General experience of comfort will improve Outcome: Adequate for Discharge   Problem: Safety: Goal: Ability to remain free from injury will improve Outcome: Adequate for Discharge   Problem: Skin Integrity: Goal: Risk for impaired skin  integrity will decrease Outcome: Adequate for Discharge

## 2020-03-18 NOTE — Progress Notes (Signed)
COVID test negative.  PTAR called to arrange transport back to [facility].   Attempted report to [facility] but no answer after being forwarded.  Will re-attempt at later time.

## 2020-03-18 NOTE — Progress Notes (Signed)
Manufacturing engineer [facility])  Received request from [facility] for hospice services at [facility] after discharge.  Chart and pt information under review by [facility] physician.  Hospice eligibility pending at this time.  Hospital liaison spoke with pt's sister [name] to initiate education related to hospice philosophy and services and to answer any questions at this time. Clarified for [name] that pt looks not to be eligible for residential hospice at this point, which means his life expectancy is expected to be >2 weeks, but is being considered for hospice services at [facility], due to an expectancy <6 months.  [name] verbalized understanding of information given.    At time o f discussion the plan was to discharge tomorrow via [place].  Discussed with [name] that admission visit would likely not be until Monday due to anticipated winter weather event.  [name] verbalized understanding.  Since that call understand from [facility] and chart review that pt will be discharged today.  Juliann Pulse to update pt and family.  Please provide prescriptions at discharge as needed to ensure ongoing symptom management until patient can be admitted onto hospice services (likely Monday).    [facility] information and contact numbers given to sister [name].  Above information shared with [employer] [employer] Manager.  Please call with any questions or concerns.  Thank you for the opportunity to participate in this pt's care.  Domenic Moras, BSN, RN [employer] [phone] (24h on call)

## 2020-03-18 NOTE — Progress Notes (Addendum)
HEMATOLOGY-ONCOLOGY PROGRESS NOTE  SUBJECTIVE: The patient denies shortness of breath today.  Denies pain today  PHYSICAL EXAMINATION:  Vitals:   03/17/20 2006 03/18/20 0420  BP: 103/75 118/85  Pulse: 83 85  Resp: 20 20  Temp: 97.9 F (36.6 C) 97.6 F (36.4 C)  SpO2: 100% 99%   Filed Weights   03/10/20 1042 03/12/20 1748 03/17/20 1600  Weight: 50 kg 49.8 kg 54.3 kg    Intake/Output from previous day: 01/13 0701 - 01/14 0700 In: 7206.7 [NG/GT:7206.7] Out: 1600 [Urine:1600]  GENERAL: Awake and alert, cachectic, no distress SKIN: skin color, texture, turgor are normal, no rashes or significant lesions LUNGS: Rhonchi and scattered wheezing present, no stridor HEART: regular rate & rhythm and no murmurs and no lower extremity edema ABDOMEN:abdomen soft, non-tender and normal bowel sounds. Left abdomen feeding tube.    LABORATORY DATA:  I have reviewed the data as listed CMP Latest Ref Rng & Units 03/17/2020 03/16/2020 03/15/2020  Glucose 70 - 99 mg/dL 202(H) 189(H) 178(H)  BUN 8 - 23 mg/dL _0 Creatinine 0.61 - 1.24 mg/dL 0.59(L) 0.57(L) 0.61  Sodium 135 - 145 mmol/L 143 142 145  Potassium 3.5 - 5.1 mmol/L 3.8 3.3(L) 3.6  Chloride 98 - 111 mmol/L 103 101 107  CO2 22 - 32 mmol/L _1 Calcium 8.9 - 10.3 mg/dL 8.5(L) 8.2(L) 8.5(L)  Total Protein 6.5 - 8.1 g/dL - - -  Total Bilirubin 0.3 - 1.2 mg/dL - - -  Alkaline Phos 38 - 126 U/L - - -  AST 15 - 41 U/L - - -  ALT 0 - 44 U/L - - -    Lab Results  Component Value Date   WBC 14.1 (H) 03/16/2020   HGB 9.2 (L) 03/16/2020   HCT 29.0 (L) 03/16/2020   MCV 74.6 (L) 03/16/2020   PLT 330 03/16/2020   NEUTROABS 13.4 (H) 03/16/2020    DG Chest 2 View  Result Date: 03/10/2020 CLINICAL DATA:  History of lung carcinoma.  Shortness of breath EXAM: CHEST - 2 VIEW COMPARISON:  Chest CT November 02, 2019; chest radiograph August 20, 2019. PET-CT November 26, 2019 FINDINGS: There is opacity in the posterior left base at the  site of known mass. Mass is ill-defined by current radiographic examination with suspected surrounding ill-defined pneumonitis. Elsewhere no edema or airspace opacity evident. Heart size and pulmonary vascular normal. No adenopathy. No bone lesions. IMPRESSION: Airspace opacity posterior left base, likely due to combination of mass and surrounding pneumonitis. Lungs elsewhere clear. Heart size normal. No adenopathy evident. Electronically Signed   By: Lowella Grip III M.D.   On: 03/10/2020 10:26   CT SOFT TISSUE NECK WO CONTRAST  Result Date: 03/14/2020 CLINICAL DATA:  [place].  Head and neck cancer. EXAM: CT NECK WITHOUT CONTRAST TECHNIQUE: Multidetector CT imaging of the neck was performed following the standard protocol without intravenous contrast. COMPARISON:  CT neck 11/02/2019 Head CT 11/26/2019 FINDINGS: Pharynx and larynx: Lack of intravenous contrast agent and marked motion at the level of the epiglottis and larynx limits study. Within that limitation, there is circumferential mucosal thickening of the supraglottic larynx with narrowing of the transverse dimension of the airway to 3 mm. There is no retropharyngeal collection. The pharynx and oral cavity are unremarkable. Salivary glands: No inflammation, mass, or stone. Thyroid: Normal. Lymph nodes: None enlarged or abnormal density. Vascular: Atherosclerotic calcification of the internal carotid arteries. Limited intracranial: Negative. Visualized orbits: Negative. Mastoids and visualized paranasal sinuses:  Clear. Skeleton: No acute or aggressive process. Upper chest: Please see dedicated report for concomitant chest CT. Other: None. IMPRESSION: 1. Lack of intravenous contrast agent and marked motion at the level of the epiglottis and larynx limits study. 2. Circumferential mucosal thickening of the supraglottic larynx with narrowing of the transverse dimension of the airway to 3 mm. Laryngeal thickening is markedly worsened compared to  11/02/2019. New/recurrent tumor or post radiation change could both account for this appearance. 3. No cervical lymphadenopathy. 4. Please see report for concomitant CT of the chest for discussion of left lung findings. Aortic Atherosclerosis (ICD10-I70.0). Electronically Signed   By: Ulyses Jarred M.D.   On: 03/14/2020 20:37   CT CHEST WO CONTRAST  Result Date: 03/15/2020 CLINICAL DATA:  Inpatient. 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 EXAM: CT CHEST WITHOUT CONTRAST TECHNIQUE: Multidetector CT imaging of the chest was performed following the standard protocol without IV contrast. COMPARISON:  03/10/2020 chest CT angiogram. 11/26/2019 PET-CT. 11/02/2027 chest CT. FINDINGS: Cardiovascular: Normal heart size. No significant pericardial effusion/thickening. Left anterior descending coronary atherosclerosis. Great vessels are normal in course and caliber. Mediastinum/Nodes: No discrete thyroid nodules. Mildly patulous thoracic esophagus containing a small amount of oral contrast. No axillary adenopathy. Mild AP window adenopathy up to 1.1 cm (series 1/image 64), new. No discrete hilar adenopathy on these noncontrast images. Lungs/Pleura: No pneumothorax. Small dependent left pleural effusion. No right pleural effusion. Moderate centrilobular and paraseptal emphysema with mild diffuse bronchial wall thickening. Patchy debris throughout left lung airways. Dense patchy consolidation and volume loss throughout the lingula and left lower lobe with scattered air bronchograms, similar to 03/10/2020 chest CT angiogram study, obscuring the left lower lobe mass seen on 11/02/2019 chest CT. Multiple (at least 5) solid pulmonary nodules scattered throughout the right lung, all new or increased since 11/02/2019 chest CT and unchanged since recent 03/10/2020 chest CT angiogram study. Representative 10 mm right upper lobe  nodule (series 5/image 55), increased from 3 mm on 11/02/2019 CT. Representative anterior right lower lobe 8 mm nodule (series 5/image 107), new. Upper abdomen: No acute abnormality. Musculoskeletal: No aggressive appearing focal osseous lesions. Mild thoracic spondylosis. IMPRESSION: 1. Patchy debris throughout the left lung airways. Dense patchy consolidation and volume loss throughout the lingula and left lower lobe with scattered air bronchograms, similar to recent 03/11/2019 chest CT angiogram study, obscuring the left lower lobe mass seen on 11/02/2019 chest CT. Findings could represent any combination of aspiration, pneumonia, atelectasis, evolving postradiation change and/or residual tumor. 2. Multiple (at least 5) solid pulmonary nodules scattered throughout the right lung, all new or increased since 11/02/2019 chest CT and unchanged since recent 03/11/2019 chest CT. 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 3. New mild AP window adenopathy, nonspecific, cannot exclude nodal metastasis versus reactive etiology. Attention on follow-up chest CT advised. 4. Small dependent left pleural effusion. 5. One vessel coronary atherosclerosis. 6. Aortic Atherosclerosis (ICD10-I70.0) and Emphysema (ICD10-J43.9). Electronically Signed   By: Ilona Sorrel M.D.   On: 03/15/2020 09:19   CT Angio Chest PE W and/or Wo Contrast  Result Date: 03/10/2020 CLINICAL DATA:  Known lung cancer currently undergoing radiation therapy (last yesterday). Sepsis, tachycardia and hypoxia. COVID test was negative. Evaluate for pneumonia versus pulmonary embolus. EXAM: CT ANGIOGRAPHY CHEST WITH CONTRAST TECHNIQUE: Multidetector CT imaging of the chest was performed using the standard protocol during bolus administration of intravenous contrast. Multiplanar CT image reconstructions and MIPs  were obtained to evaluate the vascular anatomy. CONTRAST:  152m OMNIPAQUE IOHEXOL 350 MG/ML SOLN COMPARISON:  Recent PET-CT  11/26/2019; CT biopsy 02/11/2020 FINDINGS: Cardiovascular: Satisfactory opacification of the pulmonary arteries to the segmental level. No evidence of pulmonary embolism. Normal heart size. No pericardial effusion. Coronary artery calcifications. Mediastinum/Nodes: No enlarged mediastinal, hilar, or axillary lymph nodes. Thyroid gland, trachea, and esophagus demonstrate no significant findings. Lungs/Pleura: Enlarging right upper lobe pulmonary nodule measures up to 0.9 cm and is highly concerning for metastatic disease. Enlarging nodule within the superior segment of the right lower lobe now measures 0.5 cm. Enlarging right lower lobe pulmonary nodule now measuring up to 0.7 cm. Interval development of extensive consolidative airspace opacities throughout the left lower lobe with associated volume loss. The left mainstem bronchus is completely obscured and may be stenosed or impacted with debris. The previously identified mass can be vaguely identified as low attenuation in the center of the consolidated left lower lobe. Background of centrilobular pulmonary emphysema. No pleural effusion. Upper Abdomen: No acute abnormality. Percutaneous gastrostomy tube in place. Musculoskeletal: No chest wall abnormality. No acute or significant osseous findings. Review of the MIP images confirms the above findings. IMPRESSION: 1. Negative for acute pulmonary embolus. 2. Interval development of dense consolidation and associated volume loss within the left lower lobe. The left lower lobe bronchus is also completely obscured and is likely impacted with debris. Findings are most concerning for lobar pneumonia. Acute radiation pneumonitis is possible but considered less likely given the timing (radiation therapy is still on going). 3. At least 3 enlarging pulmonary nodules are identified in the right upper and lower lobes. These were barely identifiable as punctate foci on the prior PET-CT. Enlargement over the past 3 months is  highly concerning for metastatic disease. 4. Coronary artery calcifications. 5. Percutaneous gastrostomy tube in place. 6. Centrilobular pulmonary emphysema. Emphysema (ICD10-J43.9). Electronically Signed   By: HJacqulynn CadetM.D.   On: 03/10/2020 17:23   [place]ST (PLEURAL EFFUSION)  Result Date: 03/15/2020 CLINICAL DATA:  65year old male referred for possible thoracentesis EXAM: CHEST ULTRASOUND COMPARISON:  CT 03/14/2020 FINDINGS: Ultrasound performed demonstrating scant pleural fluid on the left, with no pleural fluid on the right. Thoracentesis not performed at this time. IMPRESSION: Scant left-sided pleural fluid with no adequate window for aspiration. Electronically Signed   By: JCorrie MckusickD.O.   On: 03/15/2020 12:26   DG CHEST PORT 1 VIEW  Result Date: 03/14/2020 CLINICAL DATA:  Shortness of breath. EXAM: PORTABLE CHEST 1 VIEW COMPARISON:  CT chest and chest radiograph 03/10/2020. FINDINGS: Leftward shift of the heart mediastinum. Increasing collapse/consolidation in the left upper and left lower lobes. Large left pleural effusion. Apparent cut off of the left mainstem bronchus. Right lung is clear. Retained oral contrast is seen in the esophagus and stomach. IMPRESSION: Worsening collapse/consolidation in the lingula and left lower lobe with leftward shift of the heart and mediastinum, findings indicative of atelectasis/pneumonia due to worsening mucous plugging. Aspiration is not excluded. Electronically Signed   By: MLorin PicketM.D.   On: 03/14/2020 15:39   DG Swallowing Func-Speech Pathology  Result Date: 03/14/2020 Objective Swallowing Evaluation: Type of Study: MBS-Modified Barium Swallow Study  Patient Details Name: RJesper StirewaltMRN: [id number] of Birth: 2May 12, 1957Today's Date: 03/14/2020 Time: SLP Start Time (ACUTE ONLY): 1450 -SLP Stop Time (ACUTE ONLY): 1505 SLP Time Calculation (min) (ACUTE ONLY): 15 min Past Medical History: Past Medical History: Diagnosis [place] . [place]  . [place] (chronic obstructive  pulmonary disease) ([place])  . Medical history non-contributory  Past Surgical History: Past Surgical History: Procedure Laterality Date . BIOPSY  08/20/2019  Procedure: BIOPSY;  Surgeon: Chesley Mires, MD;  Location: [facility];  Service: Endoscopy;; . BRONCHIAL WASHINGS  08/20/2019  Procedure: BRONCHIAL WASHINGS;  Surgeon: Chesley Mires, MD;  Location: [facility];  Service: Endoscopy;;  LLL BAL . DIRECT LARYNGOSCOPY N/A 07/09/2019  Procedure: DIRECT LARYNGOSCOPY WITH BIOPSY;  Surgeon: Jerrell Belfast, MD;  Location: [facility];  Service: ENT;  Laterality: N/A; . IR FLUORO RM 30-60 MIN  07/13/2019 . IR GASTROSTOMY TUBE MOD SED  07/14/2019 . IR REPLACE G-TUBE SIMPLE WO FLUORO  10/09/2019 . NO PAST SURGERIES   . TRACHEOSTOMY TUBE PLACEMENT N/A 07/09/2019  Procedure: TRACHEOSTOMY;  Surgeon: Jerrell Belfast, MD;  Location: [facility];  Service: ENT;  Laterality: N/A; . VIDEO BRONCHOSCOPY Left 08/20/2019  Procedure: VIDEO BRONCHOSCOPY WITH FLUORO;  Surgeon: Chesley Mires, MD;  Location: [facility];  Service: Endoscopy;  Laterality: Left; HPI: Patient is a 65 y.o. male with PMH: metastatic lung cancer (just started round of XRT), COPD currently at [facility], who was found to be dyspneic and hypoxic and placed on 2L [place] and improved. He has had a couple days history of cough, increased sputnum producdtion and fevers so he was brought to ED.  CXR revealed Worsening collapse/consolidation in the lingula and left lower lobewith leftward shift of the heart and mediastinum, findings indicative of atelectasis/pneumonia due to worsening mucous plugging. Aspiration is not excluded, COVID negative. Patient was seen by speech therapy during previous admission in June of 2021 for dysphagia.  Subjective: pleasant, cooperative Assessment / Plan / Recommendation CHL IP CLINICAL IMPRESSIONS 03/14/2020 Clinical Impression Patient presents with a moderate oral and mod-severe pharyngeal dysphagia. As compared to  previous MBS in June of 2020, swallow function has improved. He exhibited moderate vallecular residuals, mild pyriform and posterior pharyngeal wall residuals with puree solids but chin tuck did help clear this. Thin liquids and nectar thick liquids both resulted in diffuse residuals in pharynx, frequent penetration during and after swallow. In addition, esophageal backflow of boluses  were observed with thin/nectar liquids moving back into pharynx and with thin liquids, into laryngeal vestibule. Patient exhibited some belching and this was followed by him regurgitating a significant amount of barium mixed with secretions (this occured twice during study). Although aspiration was not observed, patient exhibited frequent penetration on residuals, which were never fully cleared from pharynx. In addition, esophageal backflow into pharynx and laryngeal vestibule also put patient at high risk of aspiration. SLP Visit Diagnosis -- Attention and concentration deficit following -- Frontal lobe and executive function deficit following -- Impact on safety and function --   CHL IP TREATMENT RECOMMENDATION 03/14/2020 Treatment Recommendations Therapy as outlined in treatment plan below   Prognosis 03/14/2020 Prognosis for Safe Diet Advancement Fair Barriers to Reach Goals Time post onset;Severity of deficits Barriers/Prognosis Comment -- CHL IP DIET RECOMMENDATION 03/14/2020 SLP Diet Recommendations NPO Liquid Administration via -- Medication Administration Via alternative means Compensations -- Postural Changes Seated upright at 90 degrees   CHL IP OTHER RECOMMENDATIONS 03/14/2020 Recommended Consults -- Oral Care Recommendations Oral care QID Other Recommendations --   CHL IP FOLLOW UP RECOMMENDATIONS 03/14/2020 Follow up Recommendations Skilled Nursing facility;24 hour supervision/assistance   CHL IP FREQUENCY AND DURATION 03/14/2020 Speech Therapy Frequency (ACUTE ONLY) min 2x/week Treatment Duration 1 week      CHL IP ORAL PHASE  03/14/2020 Oral Phase Impaired Oral - Pudding Teaspoon --  Oral - Pudding Cup -- Oral - Honey Teaspoon -- Oral - Honey Cup -- Oral - Nectar Teaspoon -- Oral - Nectar Cup -- Oral - Nectar Straw -- Oral - Thin Teaspoon Reduced posterior propulsion;Delayed oral transit;Weak lingual manipulation Oral - Thin Cup Lingual pumping;Delayed oral transit;Reduced posterior propulsion;Weak lingual manipulation Oral - Thin Straw -- Oral - Puree Delayed oral transit;Piecemeal swallowing;Reduced posterior propulsion;Weak lingual manipulation;Lingual pumping Oral - Mech Soft -- Oral - Regular -- Oral - Multi-Consistency -- Oral - Pill -- Oral Phase - Comment --  CHL IP PHARYNGEAL PHASE 03/14/2020 Pharyngeal Phase Impaired Pharyngeal- Pudding Teaspoon -- Pharyngeal -- Pharyngeal- Pudding Cup -- Pharyngeal -- Pharyngeal- Honey Teaspoon -- Pharyngeal -- Pharyngeal- Honey Cup -- Pharyngeal -- Pharyngeal- Nectar Teaspoon -- Pharyngeal -- Pharyngeal- Nectar Cup Delayed swallow initiation-vallecula;Reduced pharyngeal peristalsis;Reduced laryngeal elevation;Pharyngeal residue - valleculae;Pharyngeal residue - pyriform;Pharyngeal residue - posterior pharnyx;Penetration/Aspiration during swallow;Penetration/Apiration after swallow Pharyngeal Material enters airway, remains ABOVE vocal cords and not ejected out Pharyngeal- Nectar Straw -- Pharyngeal -- Pharyngeal- Thin Teaspoon Reduced airway/laryngeal closure;Delayed swallow initiation-vallecula;Reduced laryngeal elevation;Penetration/Apiration after swallow;Penetration/Aspiration during swallow;Pharyngeal residue - valleculae;Pharyngeal residue - pyriform;Pharyngeal residue - posterior pharnyx Pharyngeal Material enters airway, remains ABOVE vocal cords and not ejected out Pharyngeal- Thin Cup Delayed swallow initiation-vallecula;Reduced airway/laryngeal closure;Reduced laryngeal elevation;Penetration/Aspiration during swallow;Penetration/Apiration after swallow;Pharyngeal residue -  valleculae;Pharyngeal residue - pyriform;Pharyngeal residue - posterior pharnyx Pharyngeal Material enters airway, remains ABOVE vocal cords and not ejected out Pharyngeal- Thin Straw -- Pharyngeal -- Pharyngeal- Puree Delayed swallow initiation-vallecula;Pharyngeal residue - valleculae;Pharyngeal residue - pyriform;Pharyngeal residue - posterior pharnyx Pharyngeal -- Pharyngeal- Mechanical Soft -- Pharyngeal -- Pharyngeal- Regular -- Pharyngeal -- Pharyngeal- Multi-consistency -- Pharyngeal -- Pharyngeal- Pill -- Pharyngeal -- Pharyngeal Comment --  CHL IP CERVICAL ESOPHAGEAL PHASE 03/14/2020 Cervical Esophageal Phase Impaired Pudding Teaspoon -- Pudding Cup -- Honey Teaspoon -- Honey Cup -- Nectar Teaspoon -- Nectar Cup Esophageal backflow into the pharynx;Reduced cricopharyngeal relaxation Nectar Straw -- Thin Teaspoon -- Thin Cup Esophageal backflow into the pharynx;Reduced cricopharyngeal relaxation;Esophageal backflow into the larynx Thin Straw -- Puree Reduced cricopharyngeal relaxation Mechanical Soft -- Regular -- Multi-consistency -- Pill -- Cervical Esophageal Comment -- Sonia Baller, MA, CCC-SLP Speech Therapy              ASSESSMENT AND PLAN: 1.Laryngeal cancer -07/08/2019 CT of the neck showed enhancing hypopharyngeal soft tissue suspicious for malignancy -07/09/2019 tracheostomy placement and biopsy, tracheostomy decannulation 08/26/2019 -07/09/2019 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 No cervical lymphadenopathy.  2. Left lower lobe of the lung mass concerning for primary neoplasm versus metastatic  disease -07/07/2019 CT angiogram of the chest 2.7 cm mass in the left lower lobe of the lung -CT 08/17/2019-persistent low-attenuation lesion in the superior segment of left lower lobe with surrounding consolidation/collapse, stable from 07/07/2019, consolidation and groundglass opacity in the left lower lobe have largely resolved -08/20/2019 bronchoscopy-biopsy nondiagnostic -CT chest 11/02/2019-, increased in size concerning for a primary lung malignancy versus a metastasis versus pulmonary abscess, improved left lower lobe aeration, COPD             -PET scan 11/26/2019-3.7 cm left lower lobe pulmonary mass, centrally necrotic and markedly hypermetabolic.  Second tiny 7 mm irregular nodular opacity in the superior segment left lower lobe shows low-level FDG accumulation.  Low-level  uptake in an enlarged AP window lymph node.  Mild hypermetabolism in a normal-appearing left axillary lymph node, felt to be reactive.  Bony uptake identified in the pelvis without underlying lesions evident by CT.           -CT biopsy of left lung mass 02/11/2020-squamous cell carcinoma; PDL1 TPS 0%  -CT angio chest 03/10/20-negative acute PE. Interval development dense consolidation and associated volume loss within the LLL. LLL bronchus completely obscured and likely impacted with debris. At least 3 enlarging pulmonary nodules in the right upper and lower lobes.   -Neck CT 03/14/20-circumferential mucosal thickening supraglottic larynx with narrowing of transverse dimension of airway to 3 mm. No cervical lymphadenopathy.   -CT chest 03/14/20-dense patchy consolidation and volume loss throughout the lingula and LLL with scattered air bronchograms, obscuring LLL mass seen on 11/02/2019 chest CT. Multiple solid pulmonary nodules scattered throughout right lung all new or increased since 11/02/2019. New mild AP window adenopathy.  -SBRT left lung mass 03/02/2020, 03/08/2020, 03/14/2020,  03/15/2020 3. Cachexia 4. History of aspiration pneumonia 5. Mild anemia 6. Atrial fibrillation with RVR 6. COPD 8. Alcohol abuse 9. Tobacco dependence 10. Cocaine abuse 11. Odynophagia secondary to radiation-improved 12.  Hospital admission 03/10/2020-sepsis secondary to UTI, pneumonia  Mr. Gramm appears unchanged.  He has been evaluated by ENT due to respiratory distress and stridor over the past few days.  They do not recommend tracheostomy.  His symptoms have improved with dexamethasone.  The patient has expressed that he would like to proceed with tracheostomy if it becomes needed in the future.  He has evidence of metastatic disease in the lung.  He is not a candidate for systemic chemotherapy due to poor performance status or immunotherapy due to low PD-L1.  We have recommended that he go home with hospice.  However, his sister has indicated that living with his brother is not a good situation.  Therefore, he will be discharged back to SNF.  CPR and ACLS issues were discussed with the patient and he would like to remain a full code for now.  Recommendations: 1.  Continue radiation under the care of Dr. Isidore Moos.  Last planned dose of radiation is scheduled for today. 2. continue Decadron, airway management per ENT and pulmonary medicine 3.  Recommend hospice. 4.  Outpatient follow-up has been arranged for 2 to 3 weeks.   LOS: 8 days   Devon Peterson 03/18/20  Mr. Kossman was interviewed and examined.  He appears comfortable.  He will complete the course of palliative radiation today.  We again discussed comfort care versus a trial of systemic therapy.  He understands no therapy will be curative.  I think it is unlikely he will be a candidate for chemotherapy.  I agree with hospice care.  Outpatient follow-up will be scheduled at the Cancer center for further discussion.

## 2020-03-18 NOTE — TOC Progression Note (Signed)
Transition of Care [facility]) - Progression Note    Patient Details  Name: Devon Peterson MRN: [id number] Date of Birth: Sep 16, 1955  Transition of Care [facility]) CM/SW Contact  Lorilee Cafarella, Juliann Pulse, RN Phone Number: 03/18/2020, 12:01 PM  Clinical Narrative:auth has been waived-[facility] is trying to ensure d/c today back to  [employer] sister aware. Nsg or CM may contact transport @ d/c. Awaiting rm#,tel# for nsg report.Transport Forms to be placed @ nsg station.      Expected Discharge Plan: Skilled Nursing Facility Barriers to Discharge: Insurance Authorization  Expected Discharge Plan and Services Expected Discharge Plan: [place]   Discharge Planning Services: CM Consult   Living arrangements for the past 2 months: [place] Expected Discharge Date: 03/19/20                                     Social Determinants of Health (SDOH) Interventions    Readmission Risk Interventions Readmission Risk Prevention Plan 07/27/2019  Transportation Screening Complete  HRI or Home Care Consult Complete  Social Work Consult for [place] Planning/Counseling Complete  Palliative Care Screening Complete  Medication Review [employer]) Complete  Some recent data might be hidden

## 2020-03-19 ENCOUNTER — Inpatient Hospital Stay (HOSPITAL_COMMUNITY): Payer: Medicaid Other | Admitting: Anesthesiology

## 2020-03-19 ENCOUNTER — Encounter (HOSPITAL_COMMUNITY): Payer: Self-pay | Admitting: Pulmonary Disease

## 2020-03-19 ENCOUNTER — Emergency Department (HOSPITAL_COMMUNITY): Payer: Medicaid Other

## 2020-03-19 ENCOUNTER — Encounter (HOSPITAL_COMMUNITY)
Admission: EM | Disposition: A | Discharge status: Skilled Nursing Facility | Payer: Self-pay | Source: Skilled Nursing Facility | Attending: Internal Medicine

## 2020-03-19 ENCOUNTER — Inpatient Hospital Stay (HOSPITAL_COMMUNITY)
Admission: EM | Admit: 2020-03-19 | Discharge: 2020-04-21 | DRG: 004 | Disposition: A | Discharge status: Skilled Nursing Facility | Payer: Medicaid Other | Source: Skilled Nursing Facility | Attending: Internal Medicine | Admitting: Internal Medicine

## 2020-03-19 DIAGNOSIS — J439 Emphysema, unspecified: Secondary | ICD-10-CM | POA: Diagnosis present

## 2020-03-19 DIAGNOSIS — Z7189 Other specified counseling: Secondary | ICD-10-CM

## 2020-03-19 DIAGNOSIS — Z681 Body mass index (BMI) 19 or less, adult: Secondary | ICD-10-CM | POA: Diagnosis not present

## 2020-03-19 DIAGNOSIS — G893 Neoplasm related pain (acute) (chronic): Secondary | ICD-10-CM | POA: Diagnosis present

## 2020-03-19 DIAGNOSIS — Z7951 Long term (current) use of inhaled steroids: Secondary | ICD-10-CM | POA: Diagnosis not present

## 2020-03-19 DIAGNOSIS — Z4659 Encounter for fitting and adjustment of other gastrointestinal appliance and device: Secondary | ICD-10-CM

## 2020-03-19 DIAGNOSIS — E43 Unspecified severe protein-calorie malnutrition: Secondary | ICD-10-CM | POA: Diagnosis present

## 2020-03-19 DIAGNOSIS — Z20822 Contact with and (suspected) exposure to covid-19: Secondary | ICD-10-CM | POA: Diagnosis present

## 2020-03-19 DIAGNOSIS — C139 Malignant neoplasm of hypopharynx, unspecified: Secondary | ICD-10-CM | POA: Diagnosis present

## 2020-03-19 DIAGNOSIS — Z79899 Other long term (current) drug therapy: Secondary | ICD-10-CM | POA: Diagnosis not present

## 2020-03-19 DIAGNOSIS — Z87891 Personal history of nicotine dependence: Secondary | ICD-10-CM

## 2020-03-19 DIAGNOSIS — D649 Anemia, unspecified: Secondary | ICD-10-CM | POA: Diagnosis present

## 2020-03-19 DIAGNOSIS — Z923 Personal history of irradiation: Secondary | ICD-10-CM | POA: Diagnosis not present

## 2020-03-19 DIAGNOSIS — J9690 Respiratory failure, unspecified, unspecified whether with hypoxia or hypercapnia: Secondary | ICD-10-CM | POA: Diagnosis present

## 2020-03-19 DIAGNOSIS — D630 Anemia in neoplastic disease: Secondary | ICD-10-CM | POA: Diagnosis present

## 2020-03-19 DIAGNOSIS — R0603 Acute respiratory distress: Secondary | ICD-10-CM

## 2020-03-19 DIAGNOSIS — R1312 Dysphagia, oropharyngeal phase: Secondary | ICD-10-CM | POA: Diagnosis not present

## 2020-03-19 DIAGNOSIS — Y842 Radiological procedure and radiotherapy as the cause of abnormal reaction of the patient, or of later complication, without mention of misadventure at the time of the procedure: Secondary | ICD-10-CM | POA: Diagnosis present

## 2020-03-19 DIAGNOSIS — Y732 Prosthetic and other implants, materials and accessory gastroenterology and urology devices associated with adverse incidents: Secondary | ICD-10-CM | POA: Diagnosis not present

## 2020-03-19 DIAGNOSIS — R64 Cachexia: Secondary | ICD-10-CM | POA: Diagnosis present

## 2020-03-19 DIAGNOSIS — I480 Paroxysmal atrial fibrillation: Secondary | ICD-10-CM | POA: Diagnosis present

## 2020-03-19 DIAGNOSIS — Z931 Gastrostomy status: Secondary | ICD-10-CM | POA: Diagnosis not present

## 2020-03-19 DIAGNOSIS — Q315 Congenital laryngomalacia: Secondary | ICD-10-CM | POA: Diagnosis present

## 2020-03-19 DIAGNOSIS — J700 Acute pulmonary manifestations due to radiation: Secondary | ICD-10-CM | POA: Diagnosis present

## 2020-03-19 DIAGNOSIS — I959 Hypotension, unspecified: Secondary | ICD-10-CM | POA: Diagnosis not present

## 2020-03-19 DIAGNOSIS — A419 Sepsis, unspecified organism: Secondary | ICD-10-CM | POA: Diagnosis present

## 2020-03-19 DIAGNOSIS — T85598A Other mechanical complication of other gastrointestinal prosthetic devices, implants and grafts, initial encounter: Secondary | ICD-10-CM

## 2020-03-19 DIAGNOSIS — C329 Malignant neoplasm of larynx, unspecified: Secondary | ICD-10-CM | POA: Diagnosis present

## 2020-03-19 DIAGNOSIS — Z8249 Family history of ischemic heart disease and other diseases of the circulatory system: Secondary | ICD-10-CM | POA: Diagnosis not present

## 2020-03-19 DIAGNOSIS — J9621 Acute and chronic respiratory failure with hypoxia: Secondary | ICD-10-CM

## 2020-03-19 DIAGNOSIS — R54 Age-related physical debility: Secondary | ICD-10-CM | POA: Diagnosis present

## 2020-03-19 DIAGNOSIS — Z888 Allergy status to other drugs, medicaments and biological substances status: Secondary | ICD-10-CM

## 2020-03-19 DIAGNOSIS — R0600 Dyspnea, unspecified: Secondary | ICD-10-CM

## 2020-03-19 DIAGNOSIS — E861 Hypovolemia: Secondary | ICD-10-CM | POA: Diagnosis present

## 2020-03-19 DIAGNOSIS — R061 Stridor: Secondary | ICD-10-CM | POA: Diagnosis present

## 2020-03-19 DIAGNOSIS — J9601 Acute respiratory failure with hypoxia: Secondary | ICD-10-CM | POA: Diagnosis not present

## 2020-03-19 DIAGNOSIS — K9423 Gastrostomy malfunction: Secondary | ICD-10-CM | POA: Diagnosis present

## 2020-03-19 DIAGNOSIS — J690 Pneumonitis due to inhalation of food and vomit: Secondary | ICD-10-CM | POA: Diagnosis present

## 2020-03-19 DIAGNOSIS — R1310 Dysphagia, unspecified: Secondary | ICD-10-CM

## 2020-03-19 DIAGNOSIS — J449 Chronic obstructive pulmonary disease, unspecified: Secondary | ICD-10-CM | POA: Diagnosis present

## 2020-03-19 DIAGNOSIS — C3432 Malignant neoplasm of lower lobe, left bronchus or lung: Secondary | ICD-10-CM | POA: Diagnosis present

## 2020-03-19 DIAGNOSIS — J9811 Atelectasis: Secondary | ICD-10-CM | POA: Diagnosis present

## 2020-03-19 HISTORY — PX: TRACHEOSTOMY TUBE PLACEMENT: SHX814

## 2020-03-19 LAB — CBC WITH DIFFERENTIAL/PLATELET
Abs Immature Granulocytes: 0.17 10*3/uL — ABNORMAL HIGH (ref 0.00–0.07)
Basophils Absolute: 0 10*3/uL (ref 0.0–0.1)
Basophils Relative: 0 %
Eosinophils Absolute: 0 10*3/uL (ref 0.0–0.5)
Eosinophils Relative: 0 %
HCT: 36 % — ABNORMAL LOW (ref 39.0–52.0)
Hemoglobin: 11.3 g/dL — ABNORMAL LOW (ref 13.0–17.0)
Immature Granulocytes: 2 %
Lymphocytes Relative: 4 %
Lymphs Abs: 0.4 10*3/uL — ABNORMAL LOW (ref 0.7–4.0)
MCH: 23.3 pg — ABNORMAL LOW (ref 26.0–34.0)
MCHC: 31.4 g/dL (ref 30.0–36.0)
MCV: 74.4 fL — ABNORMAL LOW (ref 80.0–100.0)
Monocytes Absolute: 0.5 10*3/uL (ref 0.1–1.0)
Monocytes Relative: 6 %
Neutro Abs: 7.9 10*3/uL — ABNORMAL HIGH (ref 1.7–7.7)
Neutrophils Relative %: 88 %
Platelets: 429 10*3/uL — ABNORMAL HIGH (ref 150–400)
RBC: 4.84 MIL/uL (ref 4.22–5.81)
RDW: 16.5 % — ABNORMAL HIGH (ref 11.5–15.5)
WBC: 9 10*3/uL (ref 4.0–10.5)
nRBC: 1.7 % — ABNORMAL HIGH (ref 0.0–0.2)

## 2020-03-19 LAB — RESP PANEL BY RT-PCR (RSV, FLU A&B, COVID)  RVPGX2
Influenza A by PCR: NEGATIVE
Influenza B by PCR: NEGATIVE
Resp Syncytial Virus by PCR: NEGATIVE
SARS Coronavirus 2 by RT PCR: NEGATIVE

## 2020-03-19 LAB — PROTIME-INR
INR: 1 (ref 0.8–1.2)
Prothrombin Time: 12.3 seconds (ref 11.4–15.2)

## 2020-03-19 LAB — TYPE AND SCREEN
ABO/RH(D): O POS
Antibody Screen: NEGATIVE

## 2020-03-19 LAB — COMPREHENSIVE METABOLIC PANEL
ALT: 15 U/L (ref 0–44)
AST: 21 U/L (ref 15–41)
Albumin: 2.6 g/dL — ABNORMAL LOW (ref 3.5–5.0)
Alkaline Phosphatase: 76 U/L (ref 38–126)
Anion gap: 14 (ref 5–15)
BUN: 14 mg/dL (ref 8–23)
CO2: 29 mmol/L (ref 22–32)
Calcium: 8.9 mg/dL (ref 8.9–10.3)
Chloride: 97 mmol/L — ABNORMAL LOW (ref 98–111)
Creatinine, Ser: 0.51 mg/dL — ABNORMAL LOW (ref 0.61–1.24)
GFR, Estimated: >60 mL/min (ref >60)
Glucose, Bld: 110 mg/dL — ABNORMAL HIGH (ref 70–99)
Potassium: 3.5 mmol/L (ref 3.5–5.1)
Sodium: 140 mmol/L (ref 135–145)
Total Bilirubin: 0.2 mg/dL — ABNORMAL LOW (ref 0.3–1.2)
Total Protein: 7.1 g/dL (ref 6.5–8.1)

## 2020-03-19 LAB — LACTIC ACID, PLASMA: Lactic Acid, Venous: 2.3 mmol/L (ref 0.5–1.9)

## 2020-03-19 LAB — TROPONIN I (HIGH SENSITIVITY): Troponin I (High Sensitivity): 6 ng/L (ref <18)

## 2020-03-19 LAB — MAGNESIUM: Magnesium: 1.5 mg/dL — ABNORMAL LOW (ref 1.7–2.4)

## 2020-03-19 LAB — GLUCOSE, CAPILLARY: Glucose-Capillary: 120 mg/dL — ABNORMAL HIGH (ref 70–99)

## 2020-03-19 LAB — BRAIN NATRIURETIC PEPTIDE: B Natriuretic Peptide: 76.2 pg/mL (ref 0.0–100.0)

## 2020-03-19 SURGERY — CREATION, TRACHEOSTOMY
Anesthesia: General | Site: Neck

## 2020-03-19 MED ORDER — DEXMEDETOMIDINE (PRECEDEX) IN NS 20 MCG/5ML (4 MCG/ML) IV SYRINGE
PREFILLED_SYRINGE | INTRAVENOUS | Status: AC
  Filled 2020-03-19: qty 5

## 2020-03-19 MED ORDER — PIPERACILLIN-TAZOBACTAM 3.375 G IVPB
INTRAVENOUS | Status: AC
  Filled 2020-03-19: qty 50

## 2020-03-19 MED ORDER — PROPOFOL 10 MG/ML IV BOLUS
INTRAVENOUS | Status: DC | PRN
  Administered 2020-03-19: 10 mg via INTRAVENOUS
  Administered 2020-03-19 (×3): 20 mg via INTRAVENOUS
  Administered 2020-03-19: 70 mg via INTRAVENOUS
  Administered 2020-03-19: 10 mg via INTRAVENOUS

## 2020-03-19 MED ORDER — ESMOLOL HCL 100 MG/10ML IV SOLN
INTRAVENOUS | Status: AC
  Filled 2020-03-19: qty 10

## 2020-03-19 MED ORDER — LIDOCAINE-EPINEPHRINE 1 %-1:100000 IJ SOLN
INTRAMUSCULAR | Status: AC
  Filled 2020-03-19: qty 1

## 2020-03-19 MED ORDER — ONDANSETRON HCL 4 MG/2ML IJ SOLN: 4.0000 mg | Freq: Once | INTRAMUSCULAR | Status: DC | PRN

## 2020-03-19 MED ORDER — PIPERACILLIN-TAZOBACTAM 3.375 G IVPB
3.3750 g | Freq: Three times a day (TID) | INTRAVENOUS | Status: DC
  Administered 2020-03-19 – 2020-03-23 (×12): 3.375 g via INTRAVENOUS
  Filled 2020-03-19 (×12): qty 50

## 2020-03-19 MED ORDER — PROPOFOL 500 MG/50ML IV EMUL
INTRAVENOUS | Status: DC | PRN
  Administered 2020-03-19: 125 ug/kg/min via INTRAVENOUS

## 2020-03-19 MED ORDER — DEXAMETHASONE SODIUM PHOSPHATE 10 MG/ML IJ SOLN
10.0000 mg | Freq: Once | INTRAMUSCULAR | Status: AC
  Administered 2020-03-19: 10 mg via INTRAVENOUS
  Filled 2020-03-19: qty 1

## 2020-03-19 MED ORDER — LORAZEPAM 2 MG/ML IJ SOLN
1.0000 mg | INTRAMUSCULAR | Status: DC | PRN
  Administered 2020-04-10: 1 mg via INTRAVENOUS
  Filled 2020-03-19: qty 1

## 2020-03-19 MED ORDER — KETAMINE HCL 10 MG/ML IJ SOLN
INTRAMUSCULAR | Status: AC
  Filled 2020-03-19: qty 1

## 2020-03-19 MED ORDER — CHLORHEXIDINE GLUCONATE 0.12 % MT SOLN
15.0000 mL | Freq: Two times a day (BID) | OROMUCOSAL | Status: DC
  Administered 2020-03-19 – 2020-04-21 (×66): 15 mL via OROMUCOSAL
  Filled 2020-03-19 (×62): qty 15

## 2020-03-19 MED ORDER — ONDANSETRON HCL 4 MG/2ML IJ SOLN
INTRAMUSCULAR | Status: DC | PRN
  Administered 2020-03-19: 4 mg via INTRAVENOUS

## 2020-03-19 MED ORDER — 0.9 % SODIUM CHLORIDE (POUR BTL) OPTIME
TOPICAL | Status: DC | PRN
Start: 2020-03-19 — End: 2020-03-19
  Administered 2020-03-19: 1000 mL

## 2020-03-19 MED ORDER — FENTANYL CITRATE (PF) 100 MCG/2ML IJ SOLN
25.0000 ug | INTRAMUSCULAR | Status: DC | PRN
  Administered 2020-03-19 (×4): 25 ug via INTRAVENOUS

## 2020-03-19 MED ORDER — LACTATED RINGERS IV SOLN: INTRAVENOUS | Status: DC | PRN

## 2020-03-19 MED ORDER — LIDOCAINE-EPINEPHRINE 1 %-1:100000 IJ SOLN
INTRAMUSCULAR | Status: DC | PRN
  Administered 2020-03-19: 3 mL

## 2020-03-19 MED ORDER — METOPROLOL TARTRATE 5 MG/5ML IV SOLN
INTRAVENOUS | Status: DC | PRN
  Administered 2020-03-19: 1 mg via INTRAVENOUS

## 2020-03-19 MED ORDER — ALBUMIN HUMAN 5 % IV SOLN
12.5000 g | Freq: Once | INTRAVENOUS | Status: AC
  Administered 2020-03-19: 12.5 g via INTRAVENOUS

## 2020-03-19 MED ORDER — FENTANYL CITRATE (PF) 100 MCG/2ML IJ SOLN
INTRAMUSCULAR | Status: AC
  Filled 2020-03-19: qty 2

## 2020-03-19 MED ORDER — FENTANYL CITRATE (PF) 100 MCG/2ML IJ SOLN
INTRAMUSCULAR | Status: DC | PRN
  Administered 2020-03-19: 25 ug via INTRAVENOUS

## 2020-03-19 MED ORDER — ALBUMIN HUMAN 5 % IV SOLN
INTRAVENOUS | Status: AC
  Filled 2020-03-19: qty 250

## 2020-03-19 MED ORDER — PANTOPRAZOLE SODIUM 40 MG IV SOLR
40.0000 mg | INTRAVENOUS | Status: DC
  Administered 2020-03-20: 40 mg via INTRAVENOUS
  Filled 2020-03-19: qty 40

## 2020-03-19 MED ORDER — LIDOCAINE HCL (PF) 2 % IJ SOLN
INTRAMUSCULAR | Status: AC
  Filled 2020-03-19: qty 5

## 2020-03-19 MED ORDER — PHENYLEPHRINE HCL-NACL 10-0.9 MG/250ML-% IV SOLN
25.0000 ug/min | INTRAVENOUS | Status: DC
  Administered 2020-03-19 – 2020-03-20 (×3): 25 ug/min via INTRAVENOUS
  Filled 2020-03-19 (×3): qty 250

## 2020-03-19 MED ORDER — ESMOLOL HCL 100 MG/10ML IV SOLN
INTRAVENOUS | Status: DC | PRN
  Administered 2020-03-19: 10 mg via INTRAVENOUS
  Administered 2020-03-19: 20 mg via INTRAVENOUS
  Administered 2020-03-19: 10 mg via INTRAVENOUS

## 2020-03-19 MED ORDER — PHENYLEPHRINE 40 MCG/ML (10ML) SYRINGE FOR IV PUSH (FOR BLOOD PRESSURE SUPPORT)
PREFILLED_SYRINGE | INTRAVENOUS | Status: DC | PRN
  Administered 2020-03-19: 120 ug via INTRAVENOUS
  Administered 2020-03-19 (×2): 40 ug via INTRAVENOUS
  Administered 2020-03-19: 120 ug via INTRAVENOUS
  Administered 2020-03-19 (×2): 80 ug via INTRAVENOUS
  Administered 2020-03-19: 120 ug via INTRAVENOUS

## 2020-03-19 MED ORDER — LIDOCAINE 2% (20 MG/ML) 5 ML SYRINGE
INTRAMUSCULAR | Status: DC | PRN
  Administered 2020-03-19: 50 mg via INTRAVENOUS

## 2020-03-19 MED ORDER — LACTATED RINGERS IV SOLN: INTRAVENOUS | Status: DC

## 2020-03-19 MED ORDER — FENTANYL CITRATE (PF) 100 MCG/2ML IJ SOLN
50.0000 ug | INTRAMUSCULAR | Status: DC | PRN
Start: 2020-03-19 — End: 2020-03-31
  Administered 2020-03-19 – 2020-03-31 (×31): 50 ug via INTRAVENOUS
  Filled 2020-03-19 (×31): qty 2

## 2020-03-19 MED ORDER — CHLORHEXIDINE GLUCONATE CLOTH 2 % EX PADS: 6.0000 | MEDICATED_PAD | Freq: Every day | CUTANEOUS | Status: DC

## 2020-03-19 MED ORDER — IPRATROPIUM-ALBUTEROL 20-100 MCG/ACT IN AERS
1.0000 | INHALATION_SPRAY | Freq: Four times a day (QID) | RESPIRATORY_TRACT | Status: DC | PRN
  Filled 2020-03-19: qty 4

## 2020-03-19 MED ORDER — PHENYLEPHRINE HCL-NACL 10-0.9 MG/250ML-% IV SOLN
INTRAVENOUS | Status: AC
  Administered 2020-03-19: 10 mg
  Filled 2020-03-19: qty 250

## 2020-03-19 MED ORDER — KETAMINE HCL 10 MG/ML IJ SOLN
INTRAMUSCULAR | Status: DC | PRN
  Administered 2020-03-19 (×3): 10 mg via INTRAVENOUS

## 2020-03-19 MED ORDER — PHENYLEPHRINE 40 MCG/ML (10ML) SYRINGE FOR IV PUSH (FOR BLOOD PRESSURE SUPPORT)
PREFILLED_SYRINGE | INTRAVENOUS | Status: AC
  Filled 2020-03-19: qty 10

## 2020-03-19 MED ORDER — FENTANYL CITRATE (PF) 250 MCG/5ML IJ SOLN
INTRAMUSCULAR | Status: AC
  Filled 2020-03-19: qty 5

## 2020-03-19 MED ORDER — DEXMEDETOMIDINE (PRECEDEX) IN NS 20 MCG/5ML (4 MCG/ML) IV SYRINGE
PREFILLED_SYRINGE | INTRAVENOUS | Status: DC | PRN
  Administered 2020-03-19: 4 ug via INTRAVENOUS
  Administered 2020-03-19: 8 ug via INTRAVENOUS
  Administered 2020-03-19: 4 ug via INTRAVENOUS

## 2020-03-19 MED ORDER — GLYCOPYRROLATE PF 0.2 MG/ML IJ SOSY
PREFILLED_SYRINGE | INTRAMUSCULAR | Status: DC | PRN
  Administered 2020-03-19: .1 mg via INTRAVENOUS

## 2020-03-19 MED ORDER — SODIUM CHLORIDE 0.9 % IV SOLN
250.0000 mL | INTRAVENOUS | Status: DC
  Administered 2020-04-08 – 2020-04-14 (×2): 250 mL via INTRAVENOUS

## 2020-03-19 MED ORDER — GLYCOPYRROLATE PF 0.2 MG/ML IJ SOSY
PREFILLED_SYRINGE | INTRAMUSCULAR | Status: AC
  Filled 2020-03-19: qty 1

## 2020-03-19 MED ORDER — ORAL CARE MOUTH RINSE
15.0000 mL | Freq: Two times a day (BID) | OROMUCOSAL | Status: DC
  Administered 2020-03-20 – 2020-04-21 (×50): 15 mL via OROMUCOSAL

## 2020-03-19 SURGICAL SUPPLY — 30 items
ATTRACTOMAT 16X20 MAGNETIC DRP (DRAPES) ×2 IMPLANT
BLADE SURG 15 STRL LF DISP TIS (BLADE) ×1 IMPLANT
BLADE SURG 15 STRL SS (BLADE) ×2
BLADE SURG SZ11 CARB STEEL (BLADE) ×2 IMPLANT
CLEANER TIP ELECTROSURG 2X2 (MISCELLANEOUS) ×2 IMPLANT
COVER SURGICAL LIGHT HANDLE (MISCELLANEOUS) ×2 IMPLANT
COVER WAND RF STERILE (DRAPES) IMPLANT
DISSECTOR ROUND CHERRY 3/8 STR (MISCELLANEOUS) IMPLANT
ELECT COATED BLADE 2.86 ST (ELECTRODE) ×2 IMPLANT
ELECT REM PT RETURN 15FT ADLT (MISCELLANEOUS) ×2 IMPLANT
GAUZE 4X4 16PLY RFD (DISPOSABLE) ×2 IMPLANT
GLOVE BIO SURGEON STRL SZ7.5 (GLOVE) ×2 IMPLANT
GOWN STRL REUS W/TWL LRG LVL3 (GOWN DISPOSABLE) ×4 IMPLANT
KIT BASIN OR (CUSTOM PROCEDURE TRAY) ×2 IMPLANT
KIT TURNOVER KIT A (KITS) IMPLANT
PACK EENT SPLIT (PACKS) ×2 IMPLANT
PENCIL SMOKE EVACUATOR (MISCELLANEOUS) IMPLANT
SPONGE DRAIN TRACH 4X4 STRL 2S (GAUZE/BANDAGES/DRESSINGS) ×2 IMPLANT
SUT SILK 2 0 (SUTURE) ×2
SUT SILK 2 0 30  PSL (SUTURE) ×2
SUT SILK 2 0 30 PSL (SUTURE) ×1 IMPLANT
SUT SILK 2 0 REEL (SUTURE) IMPLANT
SUT SILK 2 0 SH (SUTURE) ×2 IMPLANT
SUT SILK 2 0SH CR/8 30 (SUTURE) ×2 IMPLANT
SUT SILK 2-0 18XBRD TIE 12 (SUTURE) ×1 IMPLANT
SYR 10ML LL (SYRINGE) ×2 IMPLANT
SYR 20ML LL LF (SYRINGE) ×4 IMPLANT
TOWEL OR 17X26 10 PK STRL BLUE (TOWEL DISPOSABLE) ×2 IMPLANT
TUBE TRACH FLEX 4.0 CUFF (MISCELLANEOUS) ×2 IMPLANT
TUBE TRACH FLEX 6.0 UNCF (MISCELLANEOUS) ×2 IMPLANT

## 2020-03-19 NOTE — ED Notes (Signed)
Called respiratory during triage due to audible stridor. Placed IV.

## 2020-03-19 NOTE — Anesthesia Preprocedure Evaluation (Signed)
Anesthesia Evaluation  Patient identified by MRN, date of birth, ID band Patient awake    Reviewed: Allergy & Precautions, H&P , NPO status , Patient's Chart, lab work & pertinent test resultsPreop documentation limited or incomplete due to emergent nature of procedure.  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 Presented to ED today with stridor, stable on 6L O2 by facemask  For emergent awake trach and DL/biopsy of glottic mass  40 pack year history   Pulmonary exam normal  + decreased breath sounds + stridorunstable     Cardiovascular negative cardio ROS   Rhythm:Regular Rate:Tachycardia     Neuro/Psych negative neurological ROS  negative psych ROS   GI/Hepatic negative GI ROS, (+)     substance abuse (24 drinks/wk)  alcohol use,   Endo/Other  negative endocrine ROS  Renal/GU negative Renal ROS  negative genitourinary   Musculoskeletal negative musculoskeletal ROS (+)   Abdominal   Peds negative pediatric ROS (+)  Hematology negative hematology ROS (+) hct 36, plt 429   Anesthesia Other Findings   Reproductive/Obstetrics negative OB ROS                            Anesthesia Physical Anesthesia Plan  ASA: IV and emergent  Anesthesia Plan: MAC and General   Post-op Pain Management:    Induction:   PONV Risk Score and Plan: 2 and Ondansetron and Treatment may vary due to age or medical condition  Airway Management Planned: Tracheostomy  Additional Equipment: None  Intra-op Plan:   Post-operative Plan: Post-operative  intubation/ventilation  Informed Consent: I have reviewed the patients History and Physical, chart, labs and discussed the procedure including the risks, benefits and alternatives for the proposed anesthesia with the patient or authorized representative who has indicated his/her understanding and acceptance.     Dental advisory given and Only emergency history available  Plan Discussed with: CRNA  Anesthesia Plan Comments: (Light sedation for awake tracheostomy and then general anesthesia via trach to tolerate DL/biopsy of glottic mass To ICU postop Full code per patient )        Anesthesia Quick Evaluation

## 2020-03-19 NOTE — ED Notes (Signed)
Date and time results received: 03/19/20 0737 (use smartphrase ".now" to insert current time)  Test: Lactic Critical Value: 2.3  Name of Provider Notified: D[place]stra  Orders Received? Or Actions Taken?:.

## 2020-03-19 NOTE — Anesthesia Procedure Notes (Signed)
Date/Time: 03/19/2020 8:38 AM Performed by: Sharlette Dense, CRNA Oxygen Delivery Method: Simple face mask

## 2020-03-19 NOTE — Transfer of Care (Signed)
Immediate Anesthesia Transfer of Care Note  Patient: Odyn Turko  Procedure(s) Performed: awake trach rigid laryngoscopy with biopsy (N/A Neck)  Patient Location: PACU  Anesthesia Type:General  Level of Consciousness: drowsy  Airway & Oxygen Therapy: Patient Spontanous Breathing and Patient connected to T-piece oxygen  Post-op Assessment: Report given to RN and Post -op Vital signs reviewed and stable  Post vital signs: Reviewed and stable  Last Vitals:  Vitals Value Taken Time  BP 69/53 03/19/20 0927  Temp    Pulse 100 03/19/20 0927  Resp 18 03/19/20 0927  SpO2 97 % 03/19/20 0927  Vitals shown include unvalidated device data.  Last Pain:  Vitals:   03/19/20 0611  TempSrc: Oral  PainSc:          Complications: No complications documented.

## 2020-03-19 NOTE — Consult Note (Signed)
Reason for Consult: Respiratory distress Referring Physician: ER  Devon Peterson is an 65 y.o. male.  HPI: 65 year old male with history of laryngeal cancer and previous tracheostomy now with lung cancer recently admitted to the hospital due to shortness of breath and treated for pneumonia.  He declined a tracheostomy and was discharged to skilled nursing yesterday.  He returned to the ER this morning with stridor and respiratory distress and is amenable to tracheostomy.  Past Medical History:  Diagnosis Date  . Cancer ([place])   . COPD (chronic obstructive pulmonary disease) ([place])   . Medical history non-contributory     Past Surgical History:  Procedure Laterality Date  . BIOPSY  08/20/2019   Procedure: BIOPSY;  Surgeon: Chesley Mires, MD;  Location: [facility];  Service: Endoscopy;;  . BRONCHIAL WASHINGS  08/20/2019   Procedure: BRONCHIAL WASHINGS;  Surgeon: Chesley Mires, MD;  Location: [facility];  Service: Endoscopy;;  LLL BAL  . DIRECT LARYNGOSCOPY N/A 07/09/2019   Procedure: DIRECT LARYNGOSCOPY WITH BIOPSY;  Surgeon: Jerrell Belfast, MD;  Location: [facility] ORS;  Service: ENT;  Laterality: N/A;  . IR FLUORO RM 30-60 MIN  07/13/2019  . IR GASTROSTOMY TUBE MOD SED  07/14/2019  . IR REPLACE G-TUBE SIMPLE WO FLUORO  10/09/2019  . NO PAST SURGERIES    . TRACHEOSTOMY TUBE PLACEMENT N/A 07/09/2019   Procedure: TRACHEOSTOMY;  Surgeon: Jerrell Belfast, MD;  Location: [facility];  Service: ENT;  Laterality: N/A;  . VIDEO BRONCHOSCOPY Left 08/20/2019   Procedure: VIDEO BRONCHOSCOPY WITH FLUORO;  Surgeon: Chesley Mires, MD;  Location: [facility];  Service: Endoscopy;  Laterality: Left;    Family History  Problem Relation Age of Onset  . CAD Father     Social History:  reports that he quit smoking about 8 months ago. His smoking use included cigarettes. He has a 39.00 pack-year smoking history. He has never used smokeless tobacco. He reports current alcohol use of about 24.0 standard drinks of alcohol per  week. He reports previous drug use.  Allergies:  Allergies  Allergen Reactions  . Albumin (Human) Itching    May be able to handle at low rates.    Medications: I have reviewed the patient's current medications.  Results for orders placed or performed during the hospital encounter of 03/19/20 (from the past 48 hour(s))  Resp panel by RT-PCR (RSV, Flu A&B, Covid) Nasopharyngeal Swab     Status: None   Collection Time: 03/19/20  6:37 AM   Specimen: Nasopharyngeal Swab; Nasopharyngeal(NP) swabs in vial transport medium  Result Value Ref Range   SARS Coronavirus 2 by RT PCR NEGATIVE NEGATIVE    Comment: (NOTE) SARS-CoV-2 target nucleic acids are NOT DETECTED.  The SARS-CoV-2 RNA is generally detectable in upper respiratory specimens during the acute phase of infection. The lowest concentration of SARS-CoV-2 viral copies this assay can detect is 138 copies/mL. A negative result does not preclude SARS-Cov-2 infection and should not be used as the sole basis for treatment or other patient management decisions. A negative result may occur with  improper specimen collection/handling, submission of specimen other than nasopharyngeal swab, presence of viral mutation(s) within the areas targeted by this assay, and inadequate number of viral copies(<138 copies/mL). A negative result must be combined with clinical observations, patient history, and epidemiological information. The expected result is Negative.  Fact Sheet for Patients:  EntrepreneurPulse.com.au  Fact Sheet for Healthcare Providers:  IncredibleEmployment.be  This test is no t yet approved or cleared by the [place]  States FDA and  has been authorized for detection and/or diagnosis of SARS-CoV-2 by FDA under an Emergency Use Authorization (EUA). This EUA will remain  in effect (meaning this test can be used) for the duration of the COVID-19 declaration under Section 564(b)(1) of the Act,  21 U.S.C.section 360bbb-3(b)(1), unless the authorization is terminated  or revoked sooner.       Influenza A by PCR NEGATIVE NEGATIVE   Influenza B by PCR NEGATIVE NEGATIVE    Comment: (NOTE) The Xpert Xpress SARS-CoV-2/FLU/RSV plus assay is intended as an aid in the diagnosis of influenza from Nasopharyngeal swab specimens and should not be used as a sole basis for treatment. Nasal washings and aspirates are unacceptable for Xpert Xpress SARS-CoV-2/FLU/RSV testing.  Fact Sheet for Patients: EntrepreneurPulse.com.au  Fact Sheet for Healthcare Providers: IncredibleEmployment.be  This test is not yet approved or cleared by the [place] FDA and has been authorized for detection and/or diagnosis of SARS-CoV-2 by FDA under an Emergency Use Authorization (EUA). This EUA will remain in effect (meaning this test can be used) for the duration of the COVID-19 declaration under Section 564(b)(1) of the Act, 21 U.S.C. section 360bbb-3(b)(1), unless the authorization is terminated or revoked.     Resp Syncytial Virus by PCR NEGATIVE NEGATIVE    Comment: (NOTE) Fact Sheet for Patients: EntrepreneurPulse.com.au  Fact Sheet for Healthcare Providers: IncredibleEmployment.be  This test is not yet approved or cleared by the [place] FDA and has been authorized for detection and/or diagnosis of SARS-CoV-2 by FDA under an Emergency Use Authorization (EUA). This EUA will remain in effect (meaning this test can be used) for the duration of the COVID-19 declaration under Section 564(b)(1) of the Act, 21 U.S.C. section 360bbb-3(b)(1), unless the authorization is terminated or revoked.  Performed at [facility], [place] [address], [place], [place] [address]   CBC with Differential     Status: Abnormal   Collection Time: 03/19/20  6:37 AM  Result Value Ref Range   WBC 9.0 4.0 - 10.5 K/uL   RBC  4.84 4.22 - 5.81 MIL/uL   Hemoglobin 11.3 (L) 13.0 - 17.0 g/dL   HCT 36.0 (L) 39.0 - 52.0 %   MCV 74.4 (L) 80.0 - 100.0 fL   MCH 23.3 (L) 26.0 - 34.0 pg   MCHC 31.4 30.0 - 36.0 g/dL   RDW 16.5 (H) 11.5 - 15.5 %   Platelets 429 (H) 150 - 400 K/uL   nRBC 1.7 (H) 0.0 - 0.2 %   Neutrophils Relative % 88 %   Neutro Abs 7.9 (H) 1.7 - 7.7 K/uL   Lymphocytes Relative 4 %   Lymphs Abs 0.4 (L) 0.7 - 4.0 K/uL   Monocytes Relative 6 %   Monocytes Absolute 0.5 0.1 - 1.0 K/uL   Eosinophils Relative 0 %   Eosinophils Absolute 0.0 0.0 - 0.5 K/uL   Basophils Relative 0 %   Basophils Absolute 0.0 0.0 - 0.1 K/uL   Immature Granulocytes 2 %   Abs Immature Granulocytes 0.17 (H) 0.00 - 0.07 K/uL    Comment: Performed at [facility], [place] [address], [place], [place] [address]  Comprehensive metabolic panel     Status: Abnormal   Collection Time: 03/19/20  6:37 AM  Result Value Ref Range   Sodium 140 135 - 145 mmol/L   Potassium 3.5 3.5 - 5.1 mmol/L   Chloride 97 (L) 98 - 111 mmol/L   CO2 29 22 - 32 mmol/L  Glucose, Bld 110 (H) 70 - 99 mg/dL    Comment: Glucose reference range applies only to samples taken after fasting for at least 8 hours.   BUN 14 8 - 23 mg/dL   Creatinine, Ser 0.51 (L) 0.61 - 1.24 mg/dL   Calcium 8.9 8.9 - 10.3 mg/dL   Total Protein 7.1 6.5 - 8.1 g/dL   Albumin 2.6 (L) 3.5 - 5.0 g/dL   AST 21 15 - 41 U/L   ALT 15 0 - 44 U/L   Alkaline Phosphatase 76 38 - 126 U/L   Total Bilirubin 0.2 (L) 0.3 - 1.2 mg/dL   GFR, Estimated >60 >60 mL/min    Comment: (NOTE) Calculated using the CKD-EPI Creatinine Equation (2021)    Anion gap 14 5 - 15    Comment: Performed at [facility], [place] [address], [place], [place] [address]  Lactic acid, plasma     Status: Abnormal   Collection Time: 03/19/20  6:37 AM  Result Value Ref Range   Lactic Acid, Venous 2.3 (HH) 0.5 - 1.9 mmol/L    Comment: CRITICAL RESULT CALLED TO, READ BACK BY AND VERIFIED  WITHAsher Muir RN AT [phone] 03/19/20 MULLINS,T Performed at [facility], [place] [address], [place], [place] [address]   Troponin I (High Sensitivity)     Status: None   Collection Time: 03/19/20  6:37 AM  Result Value Ref Range   Troponin I (High Sensitivity) 6 <18 ng/L    Comment: (NOTE) Elevated high sensitivity troponin I (hsTnI) values and significant  changes across serial measurements may suggest ACS but many other  chronic and acute conditions are known to elevate hsTnI results.  Refer to the Links section for chest pain algorithms and additional  guidance. Performed at [facility], [place] [address], [place], [place] [address]   Magnesium     Status: Abnormal   Collection Time: 03/19/20  6:37 AM  Result Value Ref Range   Magnesium 1.5 (L) 1.7 - 2.4 mg/dL    Comment: Performed at [facility], [place] [address], [place], [place] [address]  Brain natriuretic peptide     Status: None   Collection Time: 03/19/20  6:38 AM  Result Value Ref Range   B Natriuretic Peptide 76.2 0.0 - 100.0 pg/mL    Comment: Performed at [facility], [place] [address] [place], [place] [address]  Protime-INR     Status: None   Collection Time: 03/19/20  7:29 AM  Result Value Ref Range   Prothrombin Time 12.3 11.4 - 15.2 seconds   INR 1.0 0.8 - 1.2    Comment: (NOTE) INR goal varies based on device and disease states. 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 Possible superimposed pneumonia recently. EXAM: PORTABLE CHEST 1 VIEW COMPARISON:  Chest CT and radiographs 03/14/2020 and earlier. FINDINGS: Portable AP semi upright view at continued volume loss and dense opacification of the left lung base. Recent chest ultrasound (03/15/2020) demonstrated only scant pleural  fluid. Stable mild mediastinal shift to the left. No superimposed pneumothorax. Right lung ventilation remains stable. There is increased gaseous distension of the stomach. Stable visualized osseous structures. IMPRESSION: 1. Continued volume loss and dense opacification of the left lung base. No significant pleural fluid on recent ultrasound evaluation. See differential considerations on Chest  CT 03/14/2020. 2. No new cardiopulmonary abnormality. Increased gas distention of the stomach might be related to air swallowing in the setting of shortness of breath. Electronically Signed   By: Genevie Ann M.D.   On: 03/19/2020 07:17    Review of Systems  Unable to perform ROS: Severe respiratory distress   Blood pressure 123/85, pulse (!) 107, temperature 97.8 F (36.6 C), temperature source Oral, resp. rate 19, height _0  (1.803 m), SpO2 94 %. Physical Exam Constitutional:      Comments: Cachectic  HENT:     Head: Normocephalic and atraumatic.     Right Ear: External ear normal.     Left Ear: External ear normal.     Nose: Nose normal.     Mouth/Throat:     Mouth: Mucous membranes are moist.     Pharynx: Oropharynx is clear.  Eyes:     Extraocular Movements: Extraocular movements intact.     Conjunctiva/sclera: Conjunctivae normal.     Pupils: Pupils are equal, round, and reactive to light.  Cardiovascular:     Rate and Rhythm: Normal rate.  Pulmonary:     Comments: Inspiratory stridor, tachypneic Musculoskeletal:     Cervical back: Normal range of motion.  Skin:    General: Skin is warm.  Neurological:     General: No focal deficit present.     Mental Status: He is alert and oriented to person, place, and time.  Psychiatric:        Mood and Affect: Mood normal.        Behavior: Behavior normal.        Thought Content: Thought content normal.        Judgment: Judgment normal.     Assessment/Plan: Respiratory distress, inspiratory stridor  To OR for awake tracheostomy and direct  laryngoscopy.  Melida Quitter 03/19/2020, 8:07 AM

## 2020-03-19 NOTE — ED Notes (Signed)
Respiratory placed pt on 8L [place] while waiting for provider orders

## 2020-03-19 NOTE — Brief Op Note (Signed)
03/19/2020  9:12 AM  PATIENT:  Devon Peterson  65 y.o. male  PRE-OPERATIVE DIAGNOSIS:  Inspiratory stridor, respiratory distress  POST-OPERATIVE DIAGNOSIS:  same  PROCEDURE:  Procedure(s): awake trach rigid laryngoscopy with biopsy (N/A)  SURGEON:  Surgeon(s) and Role:    Melida Quitter, MD - Primary  PHYSICIAN ASSISTANT:   ASSISTANTS: none   ANESTHESIA:   local then general  EBL:  Minimal  BLOOD ADMINISTERED:none  DRAINS: none   LOCAL MEDICATIONS USED:  LIDOCAINE   SPECIMEN:  Source of Specimen:  right supraglottic larynx  DISPOSITION OF SPECIMEN:  PATHOLOGY  COUNTS:  YES  TOURNIQUET:  * No tourniquets in log *  DICTATION: .Note written in EPIC  PLAN OF CARE: Admit to inpatient   PATIENT DISPOSITION:  PACU - hemodynamically stable.   Delay start of Pharmacological VTE agent (>24hrs) due to surgical blood loss or risk of bleeding: no

## 2020-03-19 NOTE — ED Triage Notes (Signed)
Brought by EMS from [facility], discharged yesterday from [facility] with pneumonia. Pt is notably SOB and wheezing, diminished in Left lobe, HX of lung cancer. 5L [place] at 97% Facility found him satting in the 80s and called EMS

## 2020-03-19 NOTE — H&P (Signed)
NAME:  Bertha Earwood, MRN:  [id number], DOB:  [birth year], LOS: 0 ADMISSION DATE:  03/19/2020, CONSULTATION DATE:  03/19/2020 REFERRING MD:  Dr. Roslynn Amble, ER, CHIEF COMPLAINT:  Stridor   Brief History:  65 yo male former smoker with laryngeal cancer (poorly differentiated squamous cell) presented to ER with dyspnea, stridor and hypoxia.  History of Present Illness:  He was found to have laryngeal cancer in May 2021.  He had tracheostomy placed on 07/09/19 and decannulated on 08/26/19.  He had radiation to larynx from 07/23/19 to 08/21/19.  He was found to have 2.7 cm mass in LLL on CT 07/07/19.  Bronchoscopy on 08/20/19 was non diagnostic.  He had CT biopsy of LLL mass on 02/11/20 and showed squamous cell carcinoma more concerning for additional lung primary rather than metastatic disease.  Seen by radiation oncology.  He was admitted from 03/10/20 to 03/18/20 with stridor and dyspnea.  He was seen by ENT and plan for tracheostomy on 03/17/20.  Patient declined tracheostomy, but requested to remain full code.  He presented to ER again with dyspnea, stridor and hypoxia.  ER physician contacting ENT to assess for emergent tracheostomy.  Past Medical History:  A fib, Anemia, Aspiration PNA, COPD, ETOH, Cocaine abuse, Cachexia  Significant Hospital Events:  1/15 Admit, emergent tracheostomy  Consults:  ENT  Procedures:    Significant Diagnostic Tests:  CT neck 03/14/20 >> supraglottic larynx narrowing with 3 mm airway CT chest 03/15/20 >> moderate emphysema, patchy consolidation lingula and LLL, multiple lung nodules  Micro Data:  COVID/Flu 1/15 >> negative  Antimicrobials:  Zosyn 1/15 >>   Interim History / Subjective:    Objective   Blood pressure 123/85, pulse (!) 107, temperature 97.8 F (36.6 C), temperature source Oral, resp. rate 19, height _0  (1.803 m), SpO2 94 %.       No intake or output data in the 24 hours ending 03/19/20 0747 There were no vitals filed for this  visit.  Examination:  General - alert Eyes - pupils reactive ENT - loud stridor with respiratory muscle retraction Cardiac - regular, tachycardic Chest - scattered rhonchi, decreased BS Lt base Abdomen - soft, non tender, + bowel sounds Extremities - decreased muscle bulk Skin - no rashes Neuro - moves extremities, follows commands  Resolved Hospital Problem list     Assessment & Plan:   Acute hypoxic respiratory failure with stridor for supraglottic laryngeal narrowing in setting of Squamous cell laryngeal cancer s/p XRT. - ENT arranging for emergent tracheostomy in OR - will likely need vent support transiently after procedure - f/u CXR  COPD with emphysema. - prn combivent  Aspiration pneumonia. - complete Abx course from recent hospitalization; day 10/14  NSCLC of LLL. - followed by Dr. Benay Spice with Oncology  Cachexia. - advance diet after he has tracheostomy  Chronic cancer pain. - prn fentanyl  Best practice (evaluated daily)  Diet: NPO Pain/Anxiety/Delirium protocol (if indicated): Maybe VAP protocol (if indicated): Maybe DVT prophylaxis: SCDs GI prophylaxis: Protonix Glucose control: Nope Mobility: Bed rest Disposition:ICU  Goals of Care:  Last date of multidisciplinary goals of care discussion:Pending Family and staff present: Pending Summary of discussion: Pending Follow up goals of care discussion due: Pending Code Status: Full cod  Labs   CBC: Recent Labs  Lab 03/16/20 0547 03/19/20 0637  WBC 14.1* 9.0  NEUTROABS 13.4* 7.9*  HGB 9.2* 11.3*  HCT 29.0* 36.0*  MCV 74.6* 74.4*  PLT 330 429*    Basic Metabolic Panel: Recent  Labs  Lab 03/13/20 0558 03/14/20 0535 03/14/20 1538 03/15/20 0543 03/16/20 0547 03/17/20 0605 03/19/20 0637  NA 150* 147*  --  145 142 143 140  K 2.6* 2.8* 3.3* 3.6 3.3* 3.8 3.5  CL 114* 110  --  107 101 103 97*  CO2 25 29  --  _0 GLUCOSE 128* 168*  --  178* 189* 202* 110*  BUN 11 9  --  _1 CREATININE 0.65 0.68  --  0.61 0.57* 0.59* 0.51*  CALCIUM 8.2* 8.0*  --  8.5* 8.2* 8.5* 8.9  MG 1.7 1.5*  --  1.7  --   --  1.5*   GFR: Estimated Creatinine Clearance: 71.6 mL/min (A) (by C-G formula based on SCr of 0.51 mg/dL (L)). Recent Labs  Lab 03/16/20 0547 03/19/20 0637  WBC 14.1* 9.0  LATICACIDVEN  --  2.3*    Liver Function Tests: Recent Labs  Lab 03/19/20 0637  AST 21  ALT 15  ALKPHOS 76  BILITOT 0.2*  PROT 7.1  ALBUMIN 2.6*   No results for input(s): LIPASE, AMYLASE in the last 168 hours. No results for input(s): AMMONIA in the last 168 hours.  ABG    Component Value Date/Time   PHART 7.299 (L) 07/27/2019 1655   PCO2ART 60.2 (H) 07/27/2019 1655   PO2ART 32.8 (LL) 07/27/2019 1655   HCO3 28.6 (H) 07/27/2019 1655   O2SAT 44.3 07/27/2019 1655     Coagulation Profile: No results for input(s): INR, PROTIME in the last 168 hours.  Cardiac Enzymes: No results for input(s): CKTOTAL, CKMB, CKMBINDEX, TROPONINI in the last 168 hours.  HbA1C: Hgb A1c MFr Bld  Date/Time Value Ref Range Status  03/14/2020 03:37 PM 6.3 (H) 4.8 - 5.6 % Final    Comment:    REPEATED TO VERIFY (NOTE) Pre diabetes:          5.7%-6.4%  Diabetes:              >6.4%  Glycemic control for   <7.0% adults with diabetes     CBG: Recent Labs  Lab 03/17/20 2354 03/18/20 0409 03/18/20 0805 03/18/20 1205 03/18/20 1602  GLUCAP 126* 180* 133* 135* 143*    Review of Systems:   Reviewed and negative  Past Medical History:  He,  has a past medical history of Cancer ([place]), COPD (chronic obstructive pulmonary disease) ([place]), and Medical history non-contributory.   Surgical History:   Past Surgical History:  Procedure Laterality Date  . BIOPSY  08/20/2019   Procedure: BIOPSY;  Surgeon: Chesley Mires, MD;  Location: [facility];  Service: Endoscopy;;  . BRONCHIAL WASHINGS  08/20/2019   Procedure: BRONCHIAL WASHINGS;  Surgeon: Chesley Mires, MD;  Location: [facility];   Service: Endoscopy;;  LLL BAL  . DIRECT LARYNGOSCOPY N/A 07/09/2019   Procedure: DIRECT LARYNGOSCOPY WITH BIOPSY;  Surgeon: Jerrell Belfast, MD;  Location: [facility];  Service: ENT;  Laterality: N/A;  . IR FLUORO RM 30-60 MIN  07/13/2019  . IR GASTROSTOMY TUBE MOD SED  07/14/2019  . IR REPLACE G-TUBE SIMPLE WO FLUORO  10/09/2019  . NO PAST SURGERIES    . TRACHEOSTOMY TUBE PLACEMENT N/A 07/09/2019   Procedure: TRACHEOSTOMY;  Surgeon: Jerrell Belfast, MD;  Location: [facility];  Service: ENT;  Laterality: N/A;  . VIDEO BRONCHOSCOPY Left 08/20/2019   Procedure: VIDEO BRONCHOSCOPY WITH FLUORO;  Surgeon: Chesley Mires, MD;  Location: [facility];  Service: Endoscopy;  Laterality: Left;  Social History:   reports that he quit smoking about 8 months ago. His smoking use included cigarettes. He has a 39.00 pack-year smoking history. He has never used smokeless tobacco. He reports current alcohol use of about 24.0 standard drinks of alcohol per week. He reports previous drug use.   Family History:  His family history includes CAD in his father.   Allergies Allergies  Allergen Reactions  . Albumin (Human) Itching    May be able to handle at low rates.     Home Medications  Prior to Admission medications   Medication Sig Start Date End Date Taking? Authorizing Provider  Amino Acids-Protein Hydrolys (FEEDING SUPPLEMENT, PRO-STAT SUGAR FREE 64,) LIQD Place 30 mLs into feeding tube 2 (two) times daily. 08/28/19   Patrecia Pour, MD  amoxicillin-clavulanate (AUGMENTIN) 875-125 MG tablet Take 1 tablet by mouth every 12 (twelve) hours for 1 day. 03/19/20 03/20/20  Shelly Coss, MD  chlorhexidine gluconate, MEDLINE KIT, (PERIDEX) 0.12 % solution 15 mLs by Mouth Rinse route 2 (two) times daily. 08/28/19   Patrecia Pour, MD  dexamethasone (DECADRON) 2 MG tablet Take 1 tablet (2 mg total) by mouth every 12 (twelve) hours for 1 day. 03/19/20 03/20/20  Shelly Coss, MD  dexamethasone (DECADRON) 2 MG tablet Take 1  tablet (2 mg total) by mouth daily for 3 days. 03/20/20 03/23/20  Shelly Coss, MD  ferrous sulfate 325 (65 FE) MG tablet Take 1 tablet (325 mg total) by mouth daily. 08/28/19 08/27/20  Patrecia Pour, MD  Ipratropium-Albuterol (COMBIVENT) 20-100 MCG/ACT AERS respimat Inhale 1 puff into the lungs every 6 (six) hours as needed for wheezing. 03/18/20   Shelly Coss, MD  Nutritional Supplements (ISOSOURCE 1.5 CAL) LIQD Place 237 mLs into feeding tube 5 (five) times daily. Patient taking differently: Place 60 mLs into feeding tube See admin instructions. Give 60 ml per hour in feeding tube for 12 hours a day (1900 - 0700) 08/28/19   Patrecia Pour, MD  nystatin (MYCOSTATIN) 100000 UNIT/ML suspension Take 5 mLs by mouth 4 (four) times daily as needed (thrush).    [provider]  OVER THE COUNTER MEDICATION Take 120 mLs by mouth 2 (two) times daily. 161m resource twice daily with med pass    [provider]  oxycodone (OXY-IR) 5 MG capsule Take 5 mg by mouth every 8 (eight) hours as needed for pain.     [provider]  pantoprazole (PROTONIX) 40 MG tablet Take 1 tablet (40 mg total) by mouth daily. 08/28/19 08/27/20  GPatrecia Pour MD  polyethylene glycol (MIRALAX / GLYCOLAX) 17 g packet Take 17 g by mouth daily. 08/28/19   GPatrecia Pour MD  senna (SENOKOT) 8.6 MG TABS tablet Take 2 tablets (17.2 mg total) by mouth daily. 08/28/19   GPatrecia Pour MD  sertraline (ZOLOFT) 50 MG tablet Take 1 tablet (50 mg total) by mouth daily. 08/28/19 08/27/20  GPatrecia Pour MD  thiamine 100 MG tablet Take 1 tablet (100 mg total) by mouth daily. 08/28/19   GPatrecia Pour MD     Critical care time: 34 minutes  VChesley Mires MD LCambridgePager - [phone]/15/2022, 8:12 AM

## 2020-03-19 NOTE — ED Provider Notes (Addendum)
[place] DEPT Provider Note   CSN: [id number] Arrival date & time: 03/19/20  0559     History Chief Complaint  Patient presents with  . Respiratory Distress    Jadiel Schmieder is a 64 y.o. 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 Benay Spice getting radiation therapy, paroxysmal A. fib, aspiration pneumonia, afib with RVR. Patient is full code.  Not anticoagulated.  HPI Patient presents to emergency department today via EMS with chief complaint of respiratory distress.  Level 5 caveat applies secondary to acuity of condition.  Additional history obtained from staff at [facility]: Right before EMS was called at 5:30 AM they noticed patient was gasping for breath and in respiratory distress.  They checked his O2 saturation and it was 83% on 2 L.  They increased O2 to 5 L without any symptom improvement.  No medications were given prior to arrival.  Patient has a PEG tube for supplements however does still eat by mouth.  Chart review shows patient had recent hospital admission from 03/10/2020-03/18/2020 for sepsis secondary to lobar pneumonia with suspected aspiration pneumonia, acute hypoxic respiratory failure, electrolyte abnormalities, paroxysmal A. fib.  There was discussion about discharging patient with hospice however he does not have stable living condition.  He was discharged to SNF. There was also discussion about other not to perform a tracheostomy.  Patient ultimately declined and it was documented that it could be performed later any anytime.    Past Medical History:  Diagnosis Date  . Cancer ([place])   . COPD (chronic obstructive pulmonary disease) ([place])   . Medical history non-contributory     Patient Active Problem List   Diagnosis Date Noted  . Laryngeal  stridor 03/19/2020  . Hypoxia   . Acute cystitis without hematuria   . Squamous cell carcinoma of lung ([place])   . Weakness   . Malignant neoplasm of lower lobe of left lung ([place]) 02/19/2020  . Acute respiratory insufficiency   . Squamous cell carcinoma of neck   . Palliative care encounter   . Tracheostomy care ([place])   . 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 Lung mass   . Neck mass   . Pressure injury of skin 07/08/2019  . Aspiration pneumonia ([place]) 07/07/2019  . Dysphagia 07/07/2019  . Severe protein-calorie malnutrition ([place]) 07/07/2019  . Alcohol use 07/07/2019  . Sepsis ([place]) 04/17/2019  . COPD exacerbation ([place])   . Lactic acidosis   . Tobacco abuse   . Alcohol abuse     Past Surgical History:  Procedure Laterality Date  . BIOPSY  08/20/2019   Procedure: BIOPSY;  Surgeon: Chesley Mires, MD;  Location: [facility];  Service: Endoscopy;;  . BRONCHIAL WASHINGS  08/20/2019   Procedure: BRONCHIAL WASHINGS;  Surgeon: Chesley Mires, MD;  Location: [facility];  Service: Endoscopy;;  LLL BAL  . DIRECT LARYNGOSCOPY N/A 07/09/2019   Procedure: DIRECT LARYNGOSCOPY WITH BIOPSY;  Surgeon: Jerrell Belfast, MD;  Location: [facility] ORS;  Service: ENT;  Laterality: N/A;  . IR FLUORO RM 30-60 MIN  07/13/2019  . IR GASTROSTOMY TUBE MOD SED  07/14/2019  . IR REPLACE G-TUBE SIMPLE WO FLUORO  10/09/2019  . NO PAST SURGERIES    . TRACHEOSTOMY TUBE PLACEMENT N/A 07/09/2019   Procedure: TRACHEOSTOMY;  Surgeon: Jerrell Belfast, MD;  Location: [facility];  Service: ENT;  Laterality: N/A;  . VIDEO BRONCHOSCOPY Left 08/20/2019   Procedure: VIDEO BRONCHOSCOPY WITH FLUORO;  Surgeon: Chesley Mires, MD;  Location: [facility];  Service: Endoscopy;  Laterality: Left;       Family History  Problem Relation Age of Onset  . CAD Father     Social History   Tobacco Use  . Smoking status: Former Smoker    Packs/day: 1.00    Years: 39.00    Pack years: 39.00    Types: Cigarettes    Quit date: 07/07/2019     Years since quitting: 0.7  . Smokeless tobacco: Never Used  Substance Use Topics  . Alcohol use: Yes    Alcohol/week: 24.0 standard drinks    Types: 24 Cans of beer per week  . Drug use: Not Currently    Home Medications Prior to Admission medications   Medication Sig Start Date End Date Taking? Authorizing Provider  Amino Acids-Protein Hydrolys (FEEDING SUPPLEMENT, PRO-STAT SUGAR FREE 64,) LIQD Place 30 mLs into feeding tube 2 (two) times daily. 08/28/19   Patrecia Pour, MD  chlorhexidine gluconate, MEDLINE KIT, (PERIDEX) 0.12 % solution 15 mLs by Mouth Rinse route 2 (two) times daily. 08/28/19   Patrecia Pour, MD  dexamethasone (DECADRON) 2 MG tablet Take 1 tablet (2 mg total) by mouth every 12 (twelve) hours for 1 day. 03/19/20 03/20/20  Shelly Coss, MD  dexamethasone (DECADRON) 2 MG tablet Take 1 tablet (2 mg total) by mouth daily for 3 days. 03/20/20 03/23/20  Shelly Coss, MD  ferrous sulfate 325 (65 FE) MG tablet Take 1 tablet (325 mg total) by mouth daily. 08/28/19 08/27/20  Patrecia Pour, MD  Ipratropium-Albuterol (COMBIVENT) 20-100 MCG/ACT AERS respimat Inhale 1 puff into the lungs every 6 (six) hours as needed for wheezing. 03/18/20   Shelly Coss, MD  Nutritional Supplements (ISOSOURCE 1.5 CAL) LIQD Place 237 mLs into feeding tube 5 (five) times daily. Patient taking differently: Place 60 mLs into feeding tube See admin instructions. Give 60 ml per hour in feeding tube for 12 hours a day (1900 - 0700) 08/28/19   Patrecia Pour, MD  nystatin (MYCOSTATIN) 100000 UNIT/ML suspension Take 5 mLs by mouth 4 (four) times daily as needed (thrush).    [provider]  OVER THE COUNTER MEDICATION Take 120 mLs by mouth 2 (two) times daily. 111m resource twice daily with med pass    [provider]  oxycodone (OXY-IR) 5 MG capsule Take 5 mg by mouth every 8 (eight) hours as needed for pain.     [provider]  pantoprazole (PROTONIX) 40 MG tablet Take 1 tablet (40 mg  total) by mouth daily. 08/28/19 08/27/20  GPatrecia Pour MD  polyethylene glycol (MIRALAX / GLYCOLAX) 17 g packet Take 17 g by mouth daily. 08/28/19   GPatrecia Pour MD  senna (SENOKOT) 8.6 MG TABS tablet Take 2 tablets (17.2 mg total) by mouth daily. 08/28/19   GPatrecia Pour MD  sertraline (ZOLOFT) 50 MG tablet Take 1 tablet (50 mg total) by mouth daily. 08/28/19 08/27/20  GPatrecia Pour MD  thiamine 100 MG tablet Take 1 tablet (100 mg total) by mouth daily. 08/28/19   GPatrecia Pour MD    Allergies    Albumin (human)  Review of Systems   Review of Systems  Unable to perform ROS: Acuity of condition   Physical Exam Updated Vital Signs BP 139/88 (BP Location: Right Arm)   Pulse (!) 139   Temp  97.8 F (36.6 C) (Oral)   Resp (!) 24   Ht _0  (1.803 m)   SpO2 92%   BMI 16.70 kg/m   Physical Exam Vitals and nursing note reviewed.  Constitutional:      General: He is not in acute distress.    Appearance: He is not ill-appearing.     Comments: Thin, frail appearing male  HENT:     Head: Normocephalic and atraumatic.     Right Ear: Tympanic membrane and external ear normal.     Left Ear: Tympanic membrane and external ear normal.     Nose: Nose normal.     Mouth/Throat:     Mouth: Mucous membranes are dry.     Pharynx: Oropharynx is clear.  Eyes:     General: No scleral icterus.       Right eye: No discharge.        Left eye: No discharge.     Extraocular Movements: Extraocular movements intact.     Conjunctiva/sclera: Conjunctivae normal.     Pupils: Pupils are equal, round, and reactive to light.  Neck:     Vascular: No JVD.  Cardiovascular:     Rate and Rhythm: Regular rhythm. Tachycardia present.     Pulses: Normal pulses.          Radial pulses are 2+ on the right side and 2+ on the left side.     Heart sounds: Normal heart sounds.  Pulmonary:     Comments: Patient is tachypneic.  Only able to answer in one-word phrases.  Stridor heard.  Accessory muscle use.  Not  tripoding.  Symmetric chest rise.   Oxygen saturation is 92% on 8 L [place] Abdominal:     Comments: Peg tube in place. Abdomen is soft, non-distended, and non-tender in all quadrants. No rigidity, no guarding. No peritoneal signs.  Musculoskeletal:        General: Normal range of motion.     Cervical back: Normal range of motion.  Skin:    General: Skin is warm and dry.     Capillary Refill: Capillary refill takes less than 2 seconds.     Comments: Equal tactile temperature in all extremities.  Neurological:     Mental Status: He is oriented to person, place, and time.     GCS: GCS eye subscore is 4. GCS verbal subscore is 5. GCS motor subscore is 6.     Comments: no facial droop. Follows commands  Psychiatric:        Behavior: Behavior normal.     ED Results / Procedures / Treatments   Labs (all labs ordered are listed, but only abnormal results are displayed) Labs Reviewed  CBC WITH DIFFERENTIAL/PLATELET - Abnormal; Notable for the following components:      Result Value   Hemoglobin 11.3 (*)    HCT 36.0 (*)    MCV 74.4 (*)    MCH 23.3 (*)    RDW 16.5 (*)    Platelets 429 (*)    nRBC 1.7 (*)    Neutro Abs 7.9 (*)    Lymphs Abs 0.4 (*)    Abs Immature Granulocytes 0.17 (*)    All other components within normal limits  COMPREHENSIVE METABOLIC PANEL - Abnormal; Notable for the following components:   Chloride 97 (*)    Glucose, Bld 110 (*)    Creatinine, Ser 0.51 (*)    Albumin 2.6 (*)    Total Bilirubin 0.2 (*)    All other  components within normal limits  LACTIC ACID, PLASMA - Abnormal; Notable for the following components:   Lactic Acid, Venous 2.3 (*)    All other components within normal limits  MAGNESIUM - Abnormal; Notable for the following components:   Magnesium 1.5 (*)    All other components within normal limits  RESP PANEL BY RT-PCR (RSV, FLU A&B, COVID)  RVPGX2  CULTURE, BLOOD (ROUTINE X 2)  BRAIN NATRIURETIC PEPTIDE  PROTIME-INR  TYPE AND SCREEN   TROPONIN I (HIGH SENSITIVITY)    EKG None  Radiology DG Chest Portable 1 View  Result Date: 03/19/2020 CLINICAL DATA:  65 year old male with small cell lung cancer. Possible superimposed pneumonia recently. EXAM: PORTABLE CHEST 1 VIEW COMPARISON:  Chest CT and radiographs 03/14/2020 and earlier. FINDINGS: Portable AP semi upright view at continued volume loss and dense opacification of the left lung base. Recent chest ultrasound (03/15/2020) demonstrated only scant pleural fluid. Stable mild mediastinal shift to the left. No superimposed pneumothorax. Right lung ventilation remains stable. There is increased gaseous distension of the stomach. Stable visualized osseous structures. IMPRESSION: 1. Continued volume loss and dense opacification of the left lung base. No significant pleural fluid on recent ultrasound evaluation. See differential considerations on Chest CT 03/14/2020. 2. No new cardiopulmonary abnormality. Increased gas distention of the stomach might be related to air swallowing in the setting of shortness of breath. Electronically Signed   By: Genevie Ann M.D.   On: 03/19/2020 07:17    Procedures  .Critical Care Performed by: Barrie Folk, PA-C Authorized by: Barrie Folk, PA-C   Critical care provider statement:    Critical care time (minutes):  45   Critical care time was exclusive of:  Separately billable procedures and treating other patients and teaching time   Critical care was necessary to treat or prevent imminent or life-threatening deterioration of the following conditions:  Respiratory failure   Critical care was time spent personally by me on the following activities:  Blood draw for specimens, development of treatment plan with patient or surrogate, discussions with consultants, evaluation of patient's response to treatment, examination of patient, ordering and performing treatments and interventions, ordering and review of laboratory studies, ordering  and review of radiographic studies, pulse oximetry, re-evaluation of patient's condition and review of old charts   I assumed direction of critical care for this patient from another provider in my specialty: no     Care discussed with: admitting provider     (including critical care time)  Medications Ordered in ED Medications  fentaNYL (SUBLIMAZE) injection 50 mcg (has no administration in time range)  LORazepam (ATIVAN) injection 1 mg (has no administration in time range)  pantoprazole (PROTONIX) injection 40 mg (has no administration in time range)  Ipratropium-Albuterol (COMBIVENT) respimat 1 puff (has no administration in time range)  lactated ringers infusion (has no administration in time range)  dexamethasone (DECADRON) injection 10 mg (10 mg Intravenous Given 03/19/20 0641)    ED Course  I have reviewed the triage vital signs and the nursing notes.  Pertinent labs & imaging results that were available during my care of the patient were reviewed by me and considered in my medical decision making (see chart for details).    MDM Rules/Calculators/A&P                          History provided by staff at [facility] with additional history obtained from chart review.    6:30  AM 65 yo male presenting in respiratory distress. He is frail and cachectic. On ED arrival he is afebrile, normotensive, tachycardic to 139, tachypneic. He has stridor, accessory muscle use, answering questions with one word only. He was given IV decadron as that was helpful treatment during recent hospital admission.   EKG without obvious ischemia.CBC without leukocytosis, has known anemia and hemoglobin 11.3 which is better than baseline. CMP without severe electrolyte derangement, no renal insufficiency, hypoalbuminemia 2.6, hypomagnesemia 1.5. BNP within normal limits at 76.2. Lactic acidosis 2.3. First troponin Troponin 6. INR in 1.0. Covid and influenza tests are negative. Type and screen in process.  Blood cultures pending. Patient meets SIRS criteria based on tachycardia and respiratory rate, however held off on antibiotics as this was clearly respiratory obstruction given his history. Defer antibiotics and fluid resucuitation at this time, ED attending agrees with plan of care. Low suspicion of infection etiology causing symptoms. Chest xray viewed by me shows continued volume loss and dense opacification of left lung base. No new cardiopulmonary abnormality.  Critical Care attending Dr. Halford Chessman was in the department. Informed him of pending consult and he evaluated patient at the bedside, plan for Critical Care to admit. They are asking for ENT to be consulted for emergent tracheostomy this morning. ED attending consulted case with on call ENT provider Dr. Redmond Baseman he will evaluate patient and likely take to OR for emergent tracheostomy. Reassessed patient and minimal improvement in symptoms.  8:12 AM Patient transported to PACU. Called patient's sister Camran Keady on plan of care. She is POA and asks to be called for any updates.  Shared visit with ED attending Dr. Roslynn Amble. He evaluated patient at the bedside and agrees with plan of care.   Portions of this note were generated with [employer]. Dictation errors may occur despite best attempts at proofreading.  Final Clinical Impression(s) / ED Diagnoses Final diagnoses:  Laryngeal stridor  Respiratory distress    Rx / DC Orders ED Discharge Orders    None       Barrie Folk, PA-C 03/19/20 0825    Barrie Folk, PA-C 03/20/20 0645    Lucrezia Starch, MD 03/20/20 [phone]

## 2020-03-19 NOTE — Progress Notes (Signed)
Pharmacy Antibiotic Note  Devon Peterson is a 65 y.o. male with a h/o COPD, laryngeal cancer, and lung cancer admitted on 03/19/2020 with shortness of breath and aspiration PNA with recent hospital course for same on day 10/14 antibiotics (Augmentin). Pharmacy has been consulted for Zosyn dosing for aspiration PNA. Patient is currently in OR for trach, has h/o previous trach.   Plan: Zosyn 3.375g IV q8h (4 hour infusion).   Will follow remotely   Height: 5\' 11"  (180.3 cm) IBW/kg (Calculated) : 75.3  Temp (24hrs), Avg:98 F (36.7 C), Min:97.8 F (36.6 C), Max:98.1 F (36.7 C)  Recent Labs  Lab 03/14/20 0535 03/15/20 0543 03/16/20 0547 03/17/20 0605 03/19/20 0637  WBC  --   --  14.1*  --  9.0  CREATININE 0.68 0.61 0.57* 0.59* 0.51*  LATICACIDVEN  --   --   --   --  2.3*    Estimated Creatinine Clearance: 71.6 mL/min (A) (by C-G formula based on SCr of 0.51 mg/dL (L)).    Allergies  Allergen Reactions  . Albumin (Human) Itching    May be able to handle at low rates.    Thank you for allowing pharmacy to be a part of this patient's care.  Ulice Dash D 03/19/2020 8:55 AM

## 2020-03-19 NOTE — Anesthesia Postprocedure Evaluation (Addendum)
Anesthesia Post Note  Patient: [name]  Procedure(s) Performed: awake trach rigid laryngoscopy with biopsy (N/A Neck)     Patient location during evaluation: PACU Anesthesia Type: General and MAC Level of consciousness: awake and alert, oriented and patient cooperative Pain management: pain level controlled Vital Signs Assessment: post-procedure vital signs reviewed and stable Respiratory status: spontaneous breathing, nonlabored ventilation, respiratory function stable and patient connected to tracheostomy mask oxygen Cardiovascular status: blood pressure returned to baseline and stable Postop Assessment: no apparent nausea or vomiting Anesthetic complications: no   No complications documented.  Last Vitals:  Vitals:   03/19/20 1030 03/19/20 1045  BP: 107/75 108/74  Pulse: 87 (!) 106  Resp: 17 (!) 24  Temp: (!) 36.2 C 36.4 C  SpO2: 100% 100%    Last Pain:  Vitals:   03/19/20 0925  TempSrc:   PainSc: 0-No pain                 Pervis Hocking

## 2020-03-19 NOTE — Op Note (Signed)
Preop diagnosis: History of laryngeal cancer, inspiratory stridor, respiratory distress Postop diagnosis: same Procedure: Tracheostomy and direct laryngoscopy with biopsy Surgeon: Redmond Baseman Assist: None Anesth: Local with 1% lidocaine with 1:100,000 epinephrine then general Compl: None Findings: Very firm/dense anterior tracheal wall.  Necrotic right hemilarynx down to glottis and involving anterior and medial pyriform sinus and with partial erosion of right edge of epiglottis.  Biopsies taken from right endolaryngeal supraglottis and right edge of epiglottis.  Glottis not well-visualized.  Post-cricoid hypopharynx tight and firm. Description:  After discussing risks, benefits, and alternatives, the patient was brought to the operative suite and placed on the operative table in a slightly beach chair position.  The patient was given mild sedation.  The previous trach site scar was visualized and the area injected with local anesthetic.  The neck was prepped and draped in sterile fashion.  The incision was made in a vertical orientation as low as possible in the sternal notch using a 15 blade scalpel.  Dissection was performed down the anterior tracheal wall using electrocautery.  It was difficult to estimate the position on the trachea but a horizontal incision was made as low as possible.  The wall was very thick and firm.  An 18 gauge needle on a syringe was used to confirm positioning.  The incision was deepened until the trachea was entered.  A lot of white mucus came out.  After suctioning, a trach spreader was inserted and then a #4 cuffed [place] was placed and the cuff inflated.  The Anesthesia circuit was attached and the patient was successfully ventilated.  He was then placed under general anesthesia. The trach flange was secured to the neck skin using 2-0 silk in four quadrants.  The bed was turned 90 degrees from Anesthesia and the eyes were taped closed.  A damp gauze was placed over the upper  teeth and an anterior commissure laryngoscope was used to evaluate the pharynx and larynx.  Findings are noted above.  Some of the necrotic tissue of the right supraglottic larynx was debrided with cup forceps.  Biopsies were then taken from the right endolarynx anteriorly and the right edge of the epiglottis using cup forceps.  After completion, the laryngoscope was removed from the patient's mouth.  The bed was turned back to Anesthesia.  The patient was cleaned off and a trach dressing and trach tie were added.  The patient was then returned to anesthesia and moved to the PACU in stable condition.

## 2020-03-19 NOTE — Progress Notes (Signed)
Manufacturing engineer [facility])  Mr. Bostwick was to be enrolled in hospice services upon his return to [facility].  Once he is ready to d/c, [facility] will enroll him in hospice at [facility].  Thank you, Venia Carbon RN, BSN, [facility] Liaison

## 2020-03-19 NOTE — Anesthesia Procedure Notes (Signed)
Date/Time: 03/19/2020 8:52 AM Performed by: Sharlette Dense, CRNA Induction Type: Tracheostomy

## 2020-03-20 ENCOUNTER — Inpatient Hospital Stay (HOSPITAL_COMMUNITY): Payer: Medicaid Other

## 2020-03-20 DIAGNOSIS — Q315 Congenital laryngomalacia: Secondary | ICD-10-CM | POA: Diagnosis not present

## 2020-03-20 LAB — GLUCOSE, CAPILLARY
Glucose-Capillary: 102 mg/dL — ABNORMAL HIGH (ref 70–99)
Glucose-Capillary: 120 mg/dL — ABNORMAL HIGH (ref 70–99)
Glucose-Capillary: 66 mg/dL — ABNORMAL LOW (ref 70–99)
Glucose-Capillary: 70 mg/dL (ref 70–99)
Glucose-Capillary: 94 mg/dL (ref 70–99)

## 2020-03-20 MED ORDER — OXYCODONE HCL 5 MG PO CAPS: 5.0000 mg | ORAL_CAPSULE | Freq: Four times a day (QID) | ORAL | Status: DC | PRN

## 2020-03-20 MED ORDER — POLYETHYLENE GLYCOL 3350 17 G PO PACK
17.0000 g | PACK | Freq: Every day | ORAL | Status: DC
  Administered 2020-03-20 – 2020-04-20 (×14): 17 g
  Filled 2020-03-20 (×19): qty 1

## 2020-03-20 MED ORDER — SENNA 8.6 MG PO TABS: 2.0000 | ORAL_TABLET | Freq: Every day | ORAL | Status: DC

## 2020-03-20 MED ORDER — POLYETHYLENE GLYCOL 3350 17 G PO PACK: 17.0000 g | PACK | Freq: Every day | ORAL | Status: DC

## 2020-03-20 MED ORDER — THIAMINE HCL 100 MG PO TABS: 100.0000 mg | ORAL_TABLET | Freq: Every day | ORAL | Status: DC

## 2020-03-20 MED ORDER — THIAMINE HCL 100 MG PO TABS
100.0000 mg | ORAL_TABLET | Freq: Every day | ORAL | Status: DC
  Administered 2020-03-20 – 2020-04-21 (×31): 100 mg
  Filled 2020-03-20 (×31): qty 1

## 2020-03-20 MED ORDER — JEVITY 1.5 CAL/FIBER PO LIQD
1000.0000 mL | ORAL | Status: DC
  Administered 2020-03-20 – 2020-04-12 (×22): 1000 mL
  Filled 2020-03-20 (×36): qty 1000

## 2020-03-20 MED ORDER — OSMOLITE 1.2 CAL PO LIQD
1000.0000 mL | ORAL | Status: DC
  Administered 2020-03-20: 1000 mL

## 2020-03-20 MED ORDER — PANTOPRAZOLE SODIUM 40 MG PO PACK
40.0000 mg | PACK | Freq: Every morning | ORAL | Status: DC
  Administered 2020-03-20 – 2020-04-20 (×29): 40 mg
  Filled 2020-03-20 (×34): qty 20

## 2020-03-20 MED ORDER — SERTRALINE HCL 50 MG PO TABS
50.0000 mg | ORAL_TABLET | Freq: Every day | ORAL | Status: DC
  Administered 2020-03-20 – 2020-04-21 (×31): 50 mg
  Filled 2020-03-20 (×31): qty 1

## 2020-03-20 MED ORDER — OXYCODONE HCL 5 MG PO TABS
5.0000 mg | ORAL_TABLET | Freq: Four times a day (QID) | ORAL | Status: DC | PRN
  Administered 2020-03-20 – 2020-03-29 (×13): 5 mg
  Filled 2020-03-20 (×14): qty 1

## 2020-03-20 MED ORDER — PROSOURCE TF PO LIQD
45.0000 mL | Freq: Two times a day (BID) | ORAL | Status: DC
  Administered 2020-03-20 – 2020-04-21 (×60): 45 mL
  Filled 2020-03-20 (×64): qty 45

## 2020-03-20 MED ORDER — SODIUM CHLORIDE 0.9 % IV BOLUS
1000.0000 mL | Freq: Once | INTRAVENOUS | Status: AC
  Administered 2020-03-20: 1000 mL via INTRAVENOUS

## 2020-03-20 MED ORDER — SENNA 8.6 MG PO TABS
2.0000 | ORAL_TABLET | Freq: Every day | ORAL | Status: DC
  Administered 2020-03-20 – 2020-04-20 (×15): 17.2 mg
  Filled 2020-03-20 (×19): qty 2

## 2020-03-20 MED ORDER — SERTRALINE HCL 50 MG PO TABS: 50.0000 mg | ORAL_TABLET | Freq: Every day | ORAL | Status: DC

## 2020-03-20 MED ORDER — DEXTROSE 50 % IV SOLN
25.0000 mL | Freq: Once | INTRAVENOUS | Status: AC
  Administered 2020-03-20: 25 mL via INTRAVENOUS
  Filled 2020-03-20: qty 50

## 2020-03-20 MED ORDER — LACTATED RINGERS IV BOLUS
500.0000 mL | Freq: Once | INTRAVENOUS | Status: AC
  Administered 2020-03-20: 500 mL via INTRAVENOUS

## 2020-03-20 NOTE — Progress Notes (Signed)
NAME:  Devon Peterson, MRN:  [id number], DOB:  12/14/1955, LOS: 1 ADMISSION DATE:  03/19/2020, CONSULTATION DATE:  03/19/2020 REFERRING MD:  Dr. Roslynn Amble, ER, CHIEF COMPLAINT:  Stridor   Brief History:  65 yo male former smoker with laryngeal cancer (poorly differentiated squamous cell) presented to ER with dyspnea, stridor and hypoxia.  History of Present Illness:  He was found to have laryngeal cancer in May 2021.  He had tracheostomy placed on 07/09/19 and decannulated on 08/26/19.  He had radiation to larynx from 07/23/19 to 08/21/19.  He was found to have 2.7 cm mass in LLL on CT 07/07/19.  Bronchoscopy on 08/20/19 was non diagnostic.  He had CT biopsy of LLL mass on 02/11/20 and showed squamous cell carcinoma more concerning for additional lung primary rather than metastatic disease.  Seen by radiation oncology.  He was admitted from 03/10/20 to 03/18/20 with stridor and dyspnea.  He was seen by ENT and plan for tracheostomy on 03/17/20.  Patient declined tracheostomy, but requested to remain full code.  He presented to ER again with dyspnea, stridor and hypoxia.  ER physician contacting ENT to assess for emergent tracheostomy.  Past Medical History:  A fib, Anemia, Aspiration PNA, COPD, ETOH, Cocaine abuse, Cachexia  Significant Hospital Events:  1/15 Admit, emergent tracheostomy 1/16 transfer to floor bed  Consults:  ENT  Procedures:    Significant Diagnostic Tests:  CT neck 03/14/20 >> supraglottic larynx narrowing with 3 mm airway CT chest 03/15/20 >> moderate emphysema, patchy consolidation lingula and LLL, multiple lung nodules  Micro Data:  COVID/Flu 1/15 >> negative  Antimicrobials:  Zosyn 1/15 >>   Interim History / Subjective:  Denies chest pain or abdominal pain.  Breathing better.  Objective   Blood pressure 111/69, pulse 73, temperature 98.1 F (36.7 C), temperature source Axillary, resp. rate 18, height 5\' 11"  (1.803 m), SpO2 99 %.    FiO2 (%):  [28 %-40 %] 28 %    Intake/Output Summary (Last 24 hours) at 03/20/2020 1148 Last data filed at 03/20/2020 0300 Gross per 24 hour  Intake 1514.93 ml  Output 1675 ml  Net -160.07 ml   There were no vitals filed for this visit.  Examination:  General - alert Eyes - pupils reactive ENT - trach site clean Cardiac - regular rate/rhythm, no murmur Chest - equal breath sounds b/l, no wheezing or rales Abdomen - soft, non tender, + bowel sounds, G tube in place Extremities - no cyanosis, clubbing, or edema, decreased muscle bulk Skin - no rashes Neuro - normal strength, moves extremities, follows commands Psych - normal mood and behavior  Resolved Hospital Problem list     Assessment & Plan:   Acute hypoxic respiratory failure with stridor for supraglottic laryngeal narrowing in setting of Squamous cell laryngeal cancer s/p XRT. - s/p trach by ENT 1/15 - trach care - goal SpO2 > 92% - f/u CXR as needed - speech for PM valve  COPD with emphysema. - prn combivent  Aspiration pneumonia. - complete Abx course from recent hospitalization; day 11/14  NSCLC of LLL. - followed by Dr. Benay Spice with Oncology - being assess for hospice at [facility] as outpt  Cachexia. - resume tube feeds  Chronic cancer pain. - prn fentanyl  Best practice (evaluated daily)  Diet: tube feeds DVT prophylaxis: SCDs GI prophylaxis: Protonix Mobility: as tolerated Disposition: med surg Code Status: Full code  Labs    CMP Latest Ref Rng & Units 03/19/2020 03/17/2020 03/16/2020  Glucose  70 - 99 mg/dL 110(H) 202(H) 189(H)  BUN 8 - 23 mg/dL 14 17 16   Creatinine 0.61 - 1.24 mg/dL 0.51(L) 0.59(L) 0.57(L)  Sodium 135 - 145 mmol/L 140 143 142  Potassium 3.5 - 5.1 mmol/L 3.5 3.8 3.3(L)  Chloride 98 - 111 mmol/L 97(L) 103 101  CO2 22 - 32 mmol/L 29 28 28   Calcium 8.9 - 10.3 mg/dL 8.9 8.5(L) 8.2(L)  Total Protein 6.5 - 8.1 g/dL 7.1 - -  Total Bilirubin 0.3 - 1.2 mg/dL 0.2(L) - -  Alkaline Phos 38 - 126 U/L 76 - -   AST 15 - 41 U/L 21 - -  ALT 0 - 44 U/L 15 - -    CBC Latest Ref Rng & Units 03/19/2020 03/16/2020 03/12/2020  WBC 4.0 - 10.5 K/uL 9.0 14.1(H) 7.2  Hemoglobin 13.0 - 17.0 g/dL 11.3(L) 9.2(L) 10.8(L)  Hematocrit 39.0 - 52.0 % 36.0(L) 29.0(L) 36.0(L)  Platelets 150 - 400 K/uL 429(H) 330 270    ABG    Component Value Date/Time   PHART 7.299 (L) 07/27/2019 1655   PCO2ART 60.2 (H) 07/27/2019 1655   PO2ART 32.8 (LL) 07/27/2019 1655   HCO3 28.6 (H) 07/27/2019 1655   O2SAT 44.3 07/27/2019 1655    CBG (last 3)  Recent Labs    03/18/20 1602 03/19/20 1304 03/19/20 1737  GLUCAP 143* 120* 94     Signature:  Chesley Mires, MD [place] Pager - [phone] 03/20/2020, 11:48 AM

## 2020-03-20 NOTE — Progress Notes (Signed)
Hypoglycemic Event  CBG: 66  Treatment: 62mL D50 Symptoms: none  Follow-up CBG: Time:1257 CBG Result:120  Possible Reasons for Event: Patient NPO  Comments/MD notified: Dr. Halford Chessman notified    Devon Peterson

## 2020-03-20 NOTE — Progress Notes (Signed)
eLink Physician-Brief Progress Note Patient Name: Devon Peterson DOB: June 18, 1955 MRN: [id number]   Date of Service  03/20/2020  HPI/Events of Note  Pt with hypotension MAPs 59-61. Responded well to a fluid bolus earlier today.  Discussed with RN. Has LR at 50 ml/hr. Last bolus given at 5.30 PM. Making urine. Repeat SBP 89. On trach- to 02. 5 lit.   eICU Interventions  LR 500 bolus. Follow UOP. Has COPD/asp PNA. S/p trach. AHRF- s/p XRT for laryngeal Ca.      Intervention Category Intermediate Interventions: Hypotension - evaluation and management  Elmer Sow 03/20/2020, 9:28 PM

## 2020-03-20 NOTE — Progress Notes (Signed)
Initial Nutrition Assessment  DOCUMENTATION CODES:   Underweight  INTERVENTION:   Initiate Jevity 1.5 @ 60 ml/hr via g-tube   45 ml Prosource TF BID   Tube feeding regimen provides 2240 kcal (100% of needs), 114 grams of protein, and 1014 ml of H2O.   NUTRITION DIAGNOSIS:   Inadequate oral intake related to inability to eat as evidenced by NPO status.  GOAL:   Patient will meet greater than or equal to 90% of their needs  MONITOR:   Labs,Weight trends,TF tolerance,Skin,I & O's  REASON FOR ASSESSMENT:   Consult Enteral/tube feeding initiation and management  ASSESSMENT:   65 yo male former smoker with laryngeal cancer (poorly differentiated squamous cell) presented to ER with dyspnea, stridor and hypoxia.  Pt admitted with acute hypoxic respiratory failure with stridor for supraglottic laryngeal narrowing in setting of squamous cell laryngral cancer s/p radiation.   1/15- s/p Tracheostomy and direct laryngoscopy with biopsy 1/16- NGT placed and placement verified by x-ray  Reviewed I/O's: +690 ml x 24 hours  UOP: 1.7 L x 24 hours  Pt with g-tube. Per PTA MAR, TF regimen is as follows: Isosource 1.5 @ 60 ml/hr x 12 hours and 30 ml Prostat Sugae Free BID. Complete regimen provides 1280 kcals, 79 grams protein, and 550 ml water daily, meeting 64% of estimated kcal needs and 66% of estimated protein needs.  Reviewed wt hx; pt with wt gain trend over the past 6 months, which is favorable given underweight status and likely malnutrition. Noted pt was identified with severe malnutrition on previous admission; suspect this is ongoing.   Medications reviewed and include senokot, miralax, thiamine, and lactated ringer infusion @ 50 ml/hr.  Lab Results  Component Value Date   HGBA1C 6.3 (H) 03/14/2020   PTA DM medications are none.   Labs reviewed: CBGS: 66-120 (inpatient orders for glycemic control are none).   Diet Order:   Diet Order            Diet NPO time  specified Except for: Ice Chips  Diet effective now                 EDUCATION NEEDS:   Not appropriate for education at this time  Skin:  Skin Assessment: Skin Integrity Issues: Skin Integrity Issues:: Incisions Incisions: closed neck s/p trach  Last BM:  03/18/20  Height:   Ht Readings from Last 1 Encounters:  03/19/20 5\' 11"  (1.803 m)    Weight:   Wt Readings from Last 1 Encounters:  03/17/20 54.3 kg    Ideal Body Weight:  78.2 kg  BMI:  Body mass index is 16.7 kg/m.  Estimated Nutritional Needs:   Kcal:  2000-2200  Protein:  110-125 grams  Fluid:  > 2 L    Loistine Chance, RD, LDN, [place] Registered Dietitian II Certified Diabetes Care and Education Specialist Please refer to [facility] for RD and/or RD on-call/weekend/after hours pager

## 2020-03-21 ENCOUNTER — Ambulatory Visit: Payer: Medicaid Other

## 2020-03-21 ENCOUNTER — Encounter: Payer: Self-pay | Admitting: Radiation Oncology

## 2020-03-21 ENCOUNTER — Other Ambulatory Visit: Payer: Self-pay

## 2020-03-21 DIAGNOSIS — J9601 Acute respiratory failure with hypoxia: Secondary | ICD-10-CM | POA: Diagnosis not present

## 2020-03-21 DIAGNOSIS — Q315 Congenital laryngomalacia: Secondary | ICD-10-CM | POA: Diagnosis not present

## 2020-03-21 LAB — GLUCOSE, CAPILLARY
Glucose-Capillary: 101 mg/dL — ABNORMAL HIGH (ref 70–99)
Glucose-Capillary: 108 mg/dL — ABNORMAL HIGH (ref 70–99)
Glucose-Capillary: 116 mg/dL — ABNORMAL HIGH (ref 70–99)
Glucose-Capillary: 130 mg/dL — ABNORMAL HIGH (ref 70–99)
Glucose-Capillary: 130 mg/dL — ABNORMAL HIGH (ref 70–99)

## 2020-03-21 MED ORDER — ENOXAPARIN SODIUM 40 MG/0.4ML ~~LOC~~ SOLN
40.0000 mg | SUBCUTANEOUS | Status: DC
  Administered 2020-03-21 – 2020-04-20 (×31): 40 mg via SUBCUTANEOUS
  Filled 2020-03-21 (×31): qty 0.4

## 2020-03-21 MED ORDER — IPRATROPIUM-ALBUTEROL 0.5-2.5 (3) MG/3ML IN SOLN: 3.0000 mL | RESPIRATORY_TRACT | Status: DC | PRN

## 2020-03-21 NOTE — Progress Notes (Signed)
NAME:  Devon Peterson, MRN:  [id number], DOB:  1956/01/18, LOS: 2 ADMISSION DATE:  03/19/2020, CONSULTATION DATE:  03/19/2020 REFERRING MD:  Dr. Roslynn Amble, ER, CHIEF COMPLAINT:  Stridor   Brief History:  65 yo male former smoker with laryngeal cancer (poorly differentiated squamous cell) presented to ER with dyspnea, stridor and hypoxia.  History of Present Illness:  He was found to have laryngeal cancer in May 2021.  He had tracheostomy placed on 07/09/19 and decannulated on 08/26/19.  He had radiation to larynx from 07/23/19 to 08/21/19.  He was found to have 2.7 cm mass in LLL on CT 07/07/19.  Bronchoscopy on 08/20/19 was non diagnostic.  He had CT biopsy of LLL mass on 02/11/20 and showed squamous cell carcinoma more concerning for additional lung primary rather than metastatic disease.  Seen by radiation oncology.  He was admitted from 03/10/20 to 03/18/20 with stridor and dyspnea.  He was seen by ENT and plan for tracheostomy on 03/17/20.  Patient declined tracheostomy, but requested to remain full code.  He presented to ER again with dyspnea, stridor and hypoxia.  ER physician contacting ENT to assess for emergent tracheostomy.  Past Medical History:  A fib, Anemia, Aspiration PNA, COPD, ETOH, Cocaine abuse, Cachexia  Significant Hospital Events:  1/15 Admit, emergent tracheostomy 1/16 transfer to floor bed  Consults:  ENT  Procedures:    Significant Diagnostic Tests:  CT neck 03/14/20 >> supraglottic larynx narrowing with 3 mm airway CT chest 03/15/20 >> moderate emphysema, patchy consolidation lingula and LLL, multiple lung nodules  Micro Data:  COVID/Flu 1/15 >> negative  Antimicrobials:  Zosyn 1/15 >>   Interim History / Subjective:  Hypotensive overnight requiring fluid bolus.  Objective   Blood pressure 101/74, pulse 88, temperature 99.3 F (37.4 C), resp. rate 20, height 5\' 11"  (1.803 m), weight 55.4 kg, SpO2 98 %.    FiO2 (%):  [28 %] 28 %   Intake/Output Summary (Last 24  hours) at 03/21/2020 1510 Last data filed at 03/21/2020 0600 Gross per 24 hour  Intake 573.64 ml  Output 2850 ml  Net -2276.36 ml   Filed Weights   03/21/20 0412  Weight: 55.4 kg    Examination:  General - cachectic, chronically ill appearing man laying in bed watching TV Eyes - anicteric ENT - oral mucosa moist Neck: trach site without bleeding, cuffed Shiley #4 with balloon deflated Cardiac - RRR, no murmur Chest - breathing comfortably on TC, CTAB. Abdomen - soft, NT, ND. PEG without erythema or drainage. Extremities - minimal muscle tone, no clubbing or cyanosis, no peripheral edema Skin - no rashes or wounds Neuro - awake and alert, answering questions by nodding Psych - cooperative with exam, normal affect  Resolved Hospital Problem list     Assessment & Plan:   Acute hypoxic respiratory failure with stridor for supraglottic laryngeal narrowing in setting of squamous cell laryngeal cancer s/p XRT; now tracheostomy dependent - s/p trach by ENT 1/15 - trach care per protocol - goal SpO2 > 92%; con't TC - f/u CXR as needed - SLP evaluation for PM valve, swallow evaluation. Was able to swallow with previous tracheostomy.  Hypotension likely due to hypovolemia -TF -ok to con't maintenance fluids  COPD with emphysema - prn combivent -long-acting inhalers will not be a reasonable option for him given upper airway obstruction- all bronchodilators should be nebulized via TC  Aspiration pneumonia - complete Abx course from recent hospitalization; day 12/14  NSCLC of LLL - followed by  Dr. Benay Spice with Oncology - being assess for hospice at [facility] as outpatient  Cachexia - con't tube feeds; ok for PO intake as prescribed based on SLP's assess  We will follow periodically for trach care. Please call with questions.  Best practice (evaluated daily)  Diet: tube feeds DVT prophylaxis: SCDs GI prophylaxis: Protonix Mobility: as tolerated Disposition: med  surg Code Status: Full code  Labs    CMP Latest Ref Rng & Units 03/19/2020 03/17/2020 03/16/2020  Glucose 70 - 99 mg/dL 110(H) 202(H) 189(H)  BUN 8 - 23 mg/dL 14 17 16   Creatinine 0.61 - 1.24 mg/dL 0.51(L) 0.59(L) 0.57(L)  Sodium 135 - 145 mmol/L 140 143 142  Potassium 3.5 - 5.1 mmol/L 3.5 3.8 3.3(L)  Chloride 98 - 111 mmol/L 97(L) 103 101  CO2 22 - 32 mmol/L 29 28 28   Calcium 8.9 - 10.3 mg/dL 8.9 8.5(L) 8.2(L)  Total Protein 6.5 - 8.1 g/dL 7.1 - -  Total Bilirubin 0.3 - 1.2 mg/dL 0.2(L) - -  Alkaline Phos 38 - 126 U/L 76 - -  AST 15 - 41 U/L 21 - -  ALT 0 - 44 U/L 15 - -    CBC Latest Ref Rng & Units 03/19/2020 03/16/2020 03/12/2020  WBC 4.0 - 10.5 K/uL 9.0 14.1(H) 7.2  Hemoglobin 13.0 - 17.0 g/dL 11.3(L) 9.2(L) 10.8(L)  Hematocrit 39.0 - 52.0 % 36.0(L) 29.0(L) 36.0(L)  Platelets 150 - 400 K/uL 429(H) 330 270    ABG    Component Value Date/Time   PHART 7.299 (L) 07/27/2019 1655   PCO2ART 60.2 (H) 07/27/2019 1655   PO2ART 32.8 (LL) 07/27/2019 1655   HCO3 28.6 (H) 07/27/2019 1655   O2SAT 44.3 07/27/2019 1655    CBG (last 3)  Recent Labs    03/21/20 0402 03/21/20 0725 03/21/20 1121  GLUCAP 101* 108* [place] Addaleigh Nicholls, DO 03/21/20 3:46 PM [facility]

## 2020-03-21 NOTE — TOC Initial Note (Signed)
Transition of Care [facility]) - Initial/Assessment Note    Patient Details  Name: Devon Peterson MRN: [id number] Date of Birth: Sep 28, 1955  [facility] CM/SW Contact:    Lennart Pall, LCSW Phone Number: 03/21/2020, 3:36 PM  Clinical Narrative:                 Met with pt to introduce self/ role and he nods agreement with request to speak with his sister Devon Peterson.  Pt with new tracheostomy and simply nod yes / no answers.  Sister reports that pt has been a LTC resident at [facility] since June 2021 and she would like for him to be able to return.  Sister aware that I have spoken with the facility (admissions and the Director of Nursing) and am being told that they cannot admit pt back until the tracheostomy as been at least 40 days old.  Explained that this is a common rule with SNFs.  Am working with [facility] supervisor to determine other dc options for this patient and will keep pt and sister updated as I receive information.  MD aware as well.  Expected Discharge Plan: [place] Barriers to Discharge: Continued Medical Work up,Other (comment) (Pt with new trach (placed 03/19/20)   Patient Goals and CMS Choice Patient states their goals for this hospitalization and ongoing recovery are:: famiy would like for him to return to SNF if at all possible      Expected Discharge Plan and Services Expected Discharge Plan: [place] In-house Referral: Clinical Social Work     Living arrangements for the past 2 months: [place] ([place] since 08/2019) Expected Discharge Date:  (unknown)               DME Arranged: N/A DME Agency: NA                  Prior Living Arrangements/Services Living arrangements for the past 2 months: [place] ([place] since 08/2019) Lives with:: Facility Resident Patient language and need for interpreter reviewed:: Yes Do you feel safe going back to the place where you live?: Yes       Need for Family Participation in Patient Care: No (Comment) Care giver support system in place?: Yes (comment)   Criminal Activity/Legal Involvement Pertinent to Current Situation/Hospitalization: No - Comment as needed  Activities of Daily Living Home Assistive Devices/Equipment: Blood pressure cuff,Grab bars around toilet,Grab bars in shower,Hand-held shower hose,Hospital bed,Wheelchair,Scales,Enteral Feeding Supplies,Other (Comment) ([facility] has necessary equipment for their residents) ADL Screening (condition at time of admission) Patient's cognitive ability adequate to safely complete daily activities?: Yes Is the patient deaf or have difficulty hearing?: No Does the patient have difficulty seeing, even when wearing glasses/contacts?: No Does the patient have difficulty concentrating, remembering, or making decisions?: No Patient able to express need for assistance with ADLs?: Yes Does the patient have difficulty dressing or bathing?: Yes Independently performs ADLs?: No Communication: Independent Dressing (OT): Needs assistance Is this a change from baseline?: Pre-admission baseline Grooming: Needs assistance Is this a change from baseline?: Pre-admission baseline Feeding: Needs assistance Is this a change from baseline?: Pre-admission baseline Bathing: Needs assistance Is this a change from baseline?: Pre-admission baseline Toileting: Needs assistance Is this a change from baseline?: Pre-admission baseline In/Out Bed: Needs assistance Is this a change from baseline?: Pre-admission baseline Walks in Home: Needs assistance Is this a change from baseline?: Pre-admission baseline Does the patient have difficulty walking or climbing stairs?:  Yes (secondary to weakness) Weakness of Legs: Both Weakness of Arms/Hands: Both  Permission Sought/Granted Permission sought to share information with : Facility Contact Representative,Family Supports Permission granted to share  information with : Yes, Verbal Permission Granted  Share Information with NAME: [facility] admissions     Permission granted to share info w Relationship: sister, Devon Peterson @ [phone]     Emotional Assessment Appearance:: Appears older than stated age Attitude/Demeanor/Rapport: Gracious Affect (typically observed): Accepting Orientation: : Oriented to Self,Oriented to Place,Oriented to  Time,Oriented to Situation Alcohol / Substance Use: Not Applicable Psych Involvement: No (comment)  Admission diagnosis:  Respiratory failure (HCC) [J96.90] Respiratory distress [R06.03] Laryngeal stridor [Q31.5] Patient Active Problem List   Diagnosis Date Noted  . Laryngeal stridor 03/19/2020  . Hypoxia   . Acute cystitis without hematuria   . Squamous cell carcinoma of lung ([place])   . Weakness   . Malignant neoplasm of lower lobe of left lung ([place]) 02/19/2020  . Acute respiratory insufficiency   . Squamous cell carcinoma of neck   . Palliative care encounter   . Tracheostomy care ([place])   . 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 Lung mass   . Neck mass   . Pressure injury of skin 07/08/2019  . Aspiration pneumonia ([place]) 07/07/2019  . Dysphagia 07/07/2019  . Severe protein-calorie malnutrition ([place]) 07/07/2019  . Alcohol use 07/07/2019  . Sepsis ([place]) 04/17/2019  . COPD exacerbation ([place])   . Lactic acidosis   . Tobacco abuse   . Alcohol abuse    PCP:  Patient, No Pcp Per Pharmacy:   [id number] - [place], [address] [place] [address] Phone: [phone] Fax: [phone]     Social Determinants of Health (SDOH) Interventions    Readmission Risk Interventions Readmission Risk Prevention Plan 07/27/2019  Transportation Screening Complete  HRI or Home Care Consult Complete  Social Work Consult for [place] Planning/Counseling Complete  Palliative Care Screening Complete  Medication Review Press photographer) Complete  Some  recent data might be hidden

## 2020-03-21 NOTE — Progress Notes (Signed)
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 fib, aspiration pneumonia who was sent from skilled nursing facility for the evaluation of shortness of breath. He was just discharged from here on 03/18/2020 after prolonged hospitalization.  During that hospitalization, he was found to have aspiration pneumonia, progression of his lung cancer and possible relapse of his laryngeal cancer.  ENT was consulted at the time and he was offered tracheostomy but he declined.  As soon as he went to skilled nursing facility, his saturation dropped and they sent it back.  He underwent emergent tracheostomy by ENT on this admission.  Currently he is hemodynamically stable.  Plan is to discharge him back to skilled facility with hospice follow-up.  TOC following.  Assessment & Plan:   Active Problems:   Laryngeal stridor   Acute hypoxic respiratory failure: Desaturated even on supplemental oxygen.  Sent from skilled nursing facility. Underwent emergent tracheostomy by ENT.  He had declined tracheostomy on last hospitalization.  Admitted under PCCM service.  PCCM following for tracheostomy. CT chest on 03/15/19 showed patchy debris throughout the left lung airways,dense patchy consolidation and volume loss throughout the lingula and left lower lobe with scattered air bronchograms.Findings suggesting combination of aspiration, pneumonia, atelectasis, evolving postradiation change and/or residual tumor.Also showed solid pulmonary nodules scattered throughout the right lung.  Currently his respiratory status is stable.  Continue trach care.  He needs to follow-up  with ENT as an outpatient  Sepsis secondary lobar pneumonia: Suspected aspiration pneumonia on last admission.  Currently he is on Zosyn.  We will soon consider changing the antibiotics to oral.  Stridor/History of laryngeal cancer/dysphagia: Status post trach but decannulated on 6/21. Developed dysphagia from radiation therapy. Status post PEG. Currently NPO and on tube feeding.  CT soft tissue neck showed circumferential mucosal thickening of the supraglottic larynx with narrowing of the transverse dimension of the airway to 3 mm,Laryngeal thickening has markedly worsened compared to 11/02/2019. New/recurrent tumor or post radiation change is a possibility. ENT where following.  There was plan forTracheostomy as per [name] but patient declined and he was discharged on 1/14 with plan for outpatient follow-up. Underwent emergent tracheostomy during this admission after finding of stridor. He currently does not have a stridor.Speech following  History of COPD: No wheezing. Continue bronchodilators as needed  Paroxysmal A. fib: Currently rate is controlled. Not on anticoagulation. Currently in normal sinus rhythm.  Severe protein calorie malnutrition:BMI of 17. On tube feeding diet  Recently diagnosed right squamous cell lung cancer: Follows with Dr. Learta Codding. CT imaging concerning for metastatic disease. On radiation therapy.  Oncology recommended hospice approach.  Normocytic anemia: Most likely secondary to malignancy. Hemoglobin currently stable,moniotr as an outpatient  Goals of care: Extensive discussion held with the patient and his sister about goals of care on last admission.  Patient remains full code.  Patient was interested for home with hospice but due to his poor home environment, hospice could not be arranged.  Plan is to discharge him back to the skilled nursing facility with palliative care follow-up  Nutrition Problem: Inadequate oral intake Etiology:  inability to eat      DVT prophylaxis:Lovenox Code Status: Full  Family Communication: none at bedside Status is: Inpatient  Remains inpatient appropriate because:Inpatient level of care appropriate due to severity of illness   Dispo: The patient is from: SNF              Anticipated d/c is to: SNF              Anticipated d/c date is: 2 days              Patient currently is not medically stable to d/c.    Consultants: PCCM  Procedures:Tracheostomy  Antimicrobials:  Anti-infectives (From admission, onward)   Start     Dose/Rate Route Frequency Ordered Stop   03/19/20 1213  piperacillin-tazobactam (ZOSYN) 3.375 GM/50ML IVPB       Note to Pharmacy: Estell Harpin   : cabinet override      03/19/20 1213 03/20/20 0014   03/19/20 1000  piperacillin-tazobactam (ZOSYN) IVPB 3.375 g        3.375 g 12.5 mL/hr over 240 Minutes Intravenous Every 8 hours 03/19/20 0852        Subjective: Patient seen and examined at the bedside this morning. Comfortable.  Blood pressure still soft but stable.  On supplemental oxygen.  Denies any chest pain or shortness of breath.  Objective: Vitals:   03/21/20 0458 03/21/20 0832 03/21/20 1044 03/21/20 1308  BP: 107/72  95/63 101/74  Pulse: 87 78 88 84  Resp: 18 18 20 20   Temp: 97.9 F (36.6 C)  98.5 F (36.9 C) 99.3 F (37.4 C)  TempSrc:   Oral   SpO2: 98% 97% 100% 100%  Weight:      Height:        Intake/Output Summary (Last 24 hours) at 03/21/2020 1316 Last data filed at 03/21/2020 0600 Gross per 24 hour  Intake 573.64 ml  Output 2850 ml  Net -2276.36 ml   Filed Weights   03/21/20 0412  Weight: 55.4 kg    Examination:  General exam: Chronically ill looking, cachectic HEENT: Tracheostomy Respiratory system: Bilateral diminished air sounds but no wheezes or crackles cardiovascular system: S1 & S2 heard, RRR. No JVD, murmurs, rubs, gallops or clicks. Gastrointestinal system: Abdomen is nondistended, soft and nontender. No  organomegaly or masses felt. Normal bowel sounds heard.  PEG Central nervous system: Alert and oriented. No focal neurological deficits. Extremities: No edema, no clubbing ,no cyanosis Skin: No rashes, lesions or ulcers,no icterus ,no pallor  Data Reviewed: I have personally reviewed following labs and imaging studies  CBC: Recent Labs  Lab 03/16/20 0547 03/19/20 0637  WBC 14.1* 9.0  NEUTROABS 13.4* 7.9*  HGB 9.2* 11.3*  HCT 29.0* 36.0*  MCV 74.6* 74.4*  PLT 330 009*   Basic Metabolic Panel: Recent Labs  Lab 03/14/20 1538 03/15/20 0543 03/16/20 0547 03/17/20 0605 03/19/20 0637  NA  --  145 142 143 140  K 3.3* 3.6 3.3* 3.8 3.5  CL  --  107 101 103 97*  CO2  --  28 28 28 29   GLUCOSE  --  178* 189* 202* 110*  BUN  --  11 16 17 14   CREATININE  --  0.61 0.57* 0.59* 0.51*  CALCIUM  --  8.5* 8.2* 8.5* 8.9  MG  --  1.7  --   --  1.5*   GFR: Estimated Creatinine Clearance: 73.1 mL/min (A) (by C-G formula based on SCr of 0.51 mg/dL (L)). Liver Function Tests: Recent Labs  Lab 03/19/20 0637  AST 21  ALT 15  ALKPHOS  76  BILITOT 0.2*  PROT 7.1  ALBUMIN 2.6*   No results for input(s): LIPASE, AMYLASE in the last 168 hours. No results for input(s): AMMONIA in the last 168 hours. Coagulation Profile: Recent Labs  Lab 03/19/20 0729  INR 1.0   Cardiac Enzymes: No results for input(s): CKTOTAL, CKMB, CKMBINDEX, TROPONINI in the last 168 hours. BNP (last 3 results) No results for input(s): PROBNP in the last 8760 hours. HbA1C: No results for input(s): HGBA1C in the last 72 hours. CBG: Recent Labs  Lab 03/20/20 2146 03/20/20 2334 03/21/20 0402 03/21/20 0725 03/21/20 1121  GLUCAP 70 102* 101* 108* 116*   Lipid Profile: No results for input(s): CHOL, HDL, LDLCALC, TRIG, CHOLHDL, LDLDIRECT in the last 72 hours. Thyroid Function Tests: No results for input(s): TSH, T4TOTAL, FREET4, T3FREE, THYROIDAB in the last 72 hours. Anemia Panel: No results for input(s):  VITAMINB12, FOLATE, FERRITIN, TIBC, IRON, RETICCTPCT in the last 72 hours. Sepsis Labs: Recent Labs  Lab 03/19/20 3300  LATICACIDVEN 2.3*    Recent Results (from the past 240 hour(s))  SARS CORONAVIRUS 2 (TAT 6-24 HRS) Nasopharyngeal Nasopharyngeal Swab     Status: None   Collection Time: 03/18/20  9:16 AM   Specimen: Nasopharyngeal Swab  Result Value Ref Range Status   SARS Coronavirus 2 NEGATIVE NEGATIVE Final    Comment: (NOTE) SARS-CoV-2 target nucleic acids are NOT DETECTED.  The SARS-CoV-2 RNA is generally detectable in upper and lower respiratory specimens during the acute phase of infection. Negative results do not preclude SARS-CoV-2 infection, do not rule out co-infections with other pathogens, and should not be used as the sole basis for treatment or other patient management decisions. Negative results must be combined with clinical observations, patient history, and epidemiological information. The expected result is Negative.  Fact Sheet for Patients: SugarRoll.be  Fact Sheet for Healthcare Providers: https://www.woods-mathews.com/  This test is not yet approved or cleared by the [place] FDA and  has been authorized for detection and/or diagnosis of SARS-CoV-2 by FDA under an Emergency Use Authorization (EUA). This EUA will remain  in effect (meaning this test can be used) for the duration of the COVID-19 declaration under Se ction 564(b)(1) of the Act, 21 U.S.C. section 360bbb-3(b)(1), unless the authorization is terminated or revoked sooner.  Performed at [facility], [place] [address] [place], [place] [address]   Resp panel by RT-PCR (RSV, Flu A&B, Covid) Nasopharyngeal Swab     Status: None   Collection Time: 03/19/20  6:37 AM   Specimen: Nasopharyngeal Swab; Nasopharyngeal(NP) swabs in vial transport medium  Result Value Ref Range Status   SARS Coronavirus 2 by RT PCR NEGATIVE NEGATIVE Final     Comment: (NOTE) SARS-CoV-2 target nucleic acids are NOT DETECTED.  The SARS-CoV-2 RNA is generally detectable in upper respiratory specimens during the acute phase of infection. The lowest concentration of SARS-CoV-2 viral copies this assay can detect is 138 copies/mL. A negative result does not preclude SARS-Cov-2 infection and should not be used as the sole basis for treatment or other patient management decisions. A negative result may occur with  improper specimen collection/handling, submission of specimen other than nasopharyngeal swab, presence of viral mutation(s) within the areas targeted by this assay, and inadequate number of viral copies(<138 copies/mL). A negative result must be combined with clinical observations, patient history, and epidemiological information. The expected result is Negative.  Fact Sheet for Patients:  EntrepreneurPulse.com.au  Fact Sheet for Healthcare Providers:  IncredibleEmployment.be  This test is  no t yet approved or cleared by the [place] and  has been authorized for detection and/or diagnosis of SARS-CoV-2 by FDA under an Emergency Use Authorization (EUA). This EUA will remain  in effect (meaning this test can be used) for the duration of the COVID-19 declaration under Section 564(b)(1) of the Act, 21 U.S.C.section 360bbb-3(b)(1), unless the authorization is terminated  or revoked sooner.       Influenza A by PCR NEGATIVE NEGATIVE Final   Influenza B by PCR NEGATIVE NEGATIVE Final    Comment: (NOTE) The Xpert Xpress SARS-CoV-2/FLU/RSV plus assay is intended as an aid in the diagnosis of influenza from Nasopharyngeal swab specimens and should not be used as a sole basis for treatment. Nasal washings and aspirates are unacceptable for Xpert Xpress SARS-CoV-2/FLU/RSV testing.  Fact Sheet for Patients: EntrepreneurPulse.com.au  Fact Sheet for Healthcare  Providers: IncredibleEmployment.be  This test is not yet approved or cleared by the [place] FDA and has been authorized for detection and/or diagnosis of SARS-CoV-2 by FDA under an Emergency Use Authorization (EUA). This EUA will remain in effect (meaning this test can be used) for the duration of the COVID-19 declaration under Section 564(b)(1) of the Act, 21 U.S.C. section 360bbb-3(b)(1), unless the authorization is terminated or revoked.     Resp Syncytial Virus by PCR NEGATIVE NEGATIVE Final    Comment: (NOTE) Fact Sheet for Patients: EntrepreneurPulse.com.au  Fact Sheet for Healthcare Providers: IncredibleEmployment.be  This test is not yet approved or cleared by the [place] FDA and has been authorized for detection and/or diagnosis of SARS-CoV-2 by FDA under an Emergency Use Authorization (EUA). This EUA will remain in effect (meaning this test can be used) for the duration of the COVID-19 declaration under Section 564(b)(1) of the Act, 21 U.S.C. section 360bbb-3(b)(1), unless the authorization is terminated or revoked.  Performed at [facility], [place] [address], [place], [place] [address]   Culture, blood (routine x 2)     Status: None (Preliminary result)   Collection Time: 03/19/20  7:00 AM   Specimen: BLOOD  Result Value Ref Range Status   Specimen Description   Final    BLOOD RIGHT ANTECUBITAL Performed at [place] [address] [place], [place] [address]    Special Requests   Final    BOTTLES DRAWN AEROBIC AND ANAEROBIC Blood Culture results may not be optimal due to an excessive volume of blood received in culture bottles Performed at [place] [address], [place], [place] [address]    Culture   Final    NO GROWTH 2 DAYS Performed at [place] [address], [place], [place] [address]    Report Status PENDING   Incomplete         Radiology Studies: DG CHEST PORT 1 VIEW  Result Date: 03/20/2020 CLINICAL DATA:  Orogastric tube advancement. EXAM: PORTABLE CHEST 1 VIEW COMPARISON:  03/20/2020 at 1:35 p.m. FINDINGS: Nasal/orogastric tube has been advanced. Tip extends into the mid stomach and side hole extends below the GE junction. IMPRESSION: Well-positioned nasal/orogastric tube. Electronically Signed   By: Lajean Manes M.D.   On: 03/20/2020 14:27   DG Chest Port 1 View  Result Date: 03/20/2020 CLINICAL DATA:  Respiratory failure EXAM: PORTABLE CHEST 1 VIEW COMPARISON:  March 10, 2020 FINDINGS: The patient has undergone tracheostomy tube placement. There is a new moderate-sized left-sided pleural effusion. There is a small right-sided pleural effusion. There is vascular congestion. There  is passive atelectasis versus consolidation involving the left lung zone. There are emphysematous changes. The previously demonstrated pulmonary nodules are better visualized on the patient's prior CT. IMPRESSION: 1. Interval tracheostomy tube placement. 2. New moderate left-sided pleural effusion. 3. Small right-sided pleural effusion with adjacent compressive atelectasis. 4. Known pulmonary nodules are better visualized on the patient's prior CT. Electronically Signed   By: Constance Holster M.D.   On: 03/20/2020 05:34   DG Abd Portable 1V  Result Date: 03/20/2020 CLINICAL DATA:  NG feeding tube placement EXAM: PORTABLE ABDOMEN - 1 VIEW COMPARISON:  CT abdomen pelvis 07/08/2019 FINDINGS: A nasogastric tube courses below the diaphragm with side port at the distal esophagus/GE junction. Bowel gas pattern is nonobstructive. A gastrostomy tube projects over the left hemiabdomen. Left pleural effusion noted. No acute finding in the visualized skeleton. IMPRESSION: Nasogastric tube side port projects at the distal esophagus/GE junction. Recommend advancement into the stomach by approximately 6 cm. Electronically Signed   By:  Audie Pinto M.D.   On: 03/20/2020 13:48        Scheduled Meds: . chlorhexidine  15 mL Mouth Rinse BID  . Chlorhexidine Gluconate Cloth  6 each Topical Daily  . feeding supplement (PROSource TF)  45 mL Per Tube BID  . mouth rinse  15 mL Mouth Rinse q12n4p  . pantoprazole sodium  40 mg Per Tube q morning - 10a  . polyethylene glycol  17 g Per Tube Daily  . senna  2 tablet Per Tube Daily  . sertraline  50 mg Per Tube Daily  . thiamine  100 mg Per Tube Daily   Continuous Infusions: . sodium chloride    . feeding supplement (JEVITY 1.5 CAL/FIBER) 40 mL/hr at 03/21/20 1115  . lactated ringers Stopped (03/20/20 1446)  . piperacillin-tazobactam (ZOSYN)  IV 3.375 g (03/21/20 0630)     LOS: 2 days    Time spent:35 mins. More than 50% of that time was spent in counseling and/or coordination of care.      Shelly Coss, MD [facility] P1/17/2022, 1:16 PM

## 2020-03-21 NOTE — Progress Notes (Signed)
Brief Pharmacy note: Lovenox for VTE prophylaxis requested  TBW 55 kg, Cl > 30 ml/min, no previous anti-coagulation noted Hgb low-stable, Plt wnl  Lovenox 40mg  SQ daily  Will sign off protocol  Minda Ditto PharmD 03/21/2020, 1:45 PM

## 2020-03-21 NOTE — Evaluation (Addendum)
Passy-Muir Speaking Valve - Evaluation Patient Details  Name: Devon Peterson MRN: [id number] Date of Birth: [birth year]  Today's Date: 03/21/2020 Time: 1308-6578 SLP Time Calculation (min) (ACUTE ONLY): 15 min  Past Medical History:  Past Medical History:  Diagnosis Date  . Cancer ([place])   . COPD (chronic obstructive pulmonary disease) ([place])   . Medical history non-contributory    Past Surgical History:  Past Surgical History:  Procedure Laterality Date  . BIOPSY  08/20/2019   Procedure: BIOPSY;  Surgeon: Chesley Mires, MD;  Location: [facility];  Service: Endoscopy;;  . BRONCHIAL WASHINGS  08/20/2019   Procedure: BRONCHIAL WASHINGS;  Surgeon: Chesley Mires, MD;  Location: [facility];  Service: Endoscopy;;  LLL BAL  . DIRECT LARYNGOSCOPY N/A 07/09/2019   Procedure: DIRECT LARYNGOSCOPY WITH BIOPSY;  Surgeon: Jerrell Belfast, MD;  Location: [facility];  Service: ENT;  Laterality: N/A;  . IR FLUORO RM 30-60 MIN  07/13/2019  . IR GASTROSTOMY TUBE MOD SED  07/14/2019  . IR REPLACE G-TUBE SIMPLE WO FLUORO  10/09/2019  . NO PAST SURGERIES    . TRACHEOSTOMY TUBE PLACEMENT N/A 07/09/2019   Procedure: TRACHEOSTOMY;  Surgeon: Jerrell Belfast, MD;  Location: [facility];  Service: ENT;  Laterality: N/A;  . VIDEO BRONCHOSCOPY Left 08/20/2019   Procedure: VIDEO BRONCHOSCOPY WITH FLUORO;  Surgeon: Chesley Mires, MD;  Location: [facility];  Service: Endoscopy;  Laterality: Left;   HPI:  65 yo with laryngeal cancer diagnosed May 2021. Per MD noted, tracheostomy placed on 07/09/19 and decannulated on 08/26/19; radiation to larynx from 07/23/19 to 08/21/19. Found to have 2.7 cm mass in LLL on CT with biopsy 02/11/20 showed squamous cell carcinoma more concerning for additional lung primary rather than metastatic disease. Admitted from 03/10/20 to 03/18/20 with stridor and dyspnea and tracheostomy planned 03/17/20  > pt refused then readmitted with dyspnea, stridor and hypoxia underwent emergent tracheostomy. ENT findings: very  firm/dense anterior tracheal wall.  Necrotic right hemilarynx down to glottis and involving anterior and medial pyriform sinus and with partial erosion of right edge of epiglottis. Glottis not well-visualized.  Post-cricoid hypopharynx tight and firm. Seen prior bt ST for PMV and dysphagia.   Assessment / Plan / Recommendation Clinical Impression  Pt seen for PMV with partial cuff inflation on SLP arrival. He indicated he has a valve at home but does not use it. Deflation resulted in copious secretions, expectorating orally and tracheally for up to 5 minutes. Secretions were frothy and clear to whitish. Ater recovering from secretion management valve placed for duration of 8 seconds due to pt's exhlation/cough removed. Air aggregating behind valve when removed indicative of airflow inadequacy for exhalation. Verbalization was one-two words per breath and significantly hoarse and strained with additional force needed. SpO2 98%, Hr 80 and work of breathing increased. Anatomical differences with laryngeal cancer, radiation, necrotic tissue, post-cricoid hypopharynx tight and firm in addition to copious secretions there is insufficient airflow with valve. ST will continue with possible decrease in secretions to facilitate phonation. Reommend use valve with ST only SLP Visit Diagnosis: Aphonia (R49.1)    SLP Assessment  Patient needs continued Speech [place] Pathology Services    Follow Up Recommendations  Skilled Nursing facility;24 hour supervision/assistance    Frequency and Duration min 2x/week  2 weeks    PMSV Trial PMSV was placed for:  (8 sec) Able to redirect subglottic air through upper airway: Yes Able to Attain Phonation: Yes Voice Quality: Low vocal intensity;Hoarse (strained) Able to Expectorate Secretions: Yes Level of  Secretion Expectoration with PMSV: Tracheal;Oral Breath Support for Phonation: Severely decreased Intelligibility: Intelligibility reduced Respirations During  Trial:  (WNL) SpO2 During Trial: 98 % Pulse During Trial: 80 Behavior: Alert;Cooperative   Tracheostomy Tube       Vent Dependency  FiO2 (%): 28 %    Cuff Deflation Trial  GO          Houston Siren 03/21/2020, 4:24 PM Orbie Pyo Colvin Caroli.Ed Risk analyst [phone] Office [phone]

## 2020-03-21 NOTE — Progress Notes (Signed)
Received patient from icu, transferred into bed, trach patent but patient coughing up copious amounts of thick whitish/yellow secretions, suction set up and performed, ng tube to right nare (reinforced with pink hyperfix tape) with jevity running at 30/hr- will verify rate as mar states 60/hr, g-tube in place, sacral foam on, skin dry and intact, iv fluids infusing through left ac, vss,call bell in reach, will continue to monitor.

## 2020-03-21 NOTE — Progress Notes (Signed)
[facility])   This patient has been referred for hospice services.  [facility] will continue to follow for any discharge planning needs and to coordinate admission onto hospice care.    Thank you for the opportunity to participate in this patient's care.     Domenic Moras, BSN, RN [facility] Liaison   [phone] (24h on call)

## 2020-03-22 ENCOUNTER — Inpatient Hospital Stay (HOSPITAL_COMMUNITY): Payer: Medicaid Other

## 2020-03-22 ENCOUNTER — Ambulatory Visit: Payer: Medicaid Other

## 2020-03-22 ENCOUNTER — Encounter (HOSPITAL_COMMUNITY): Payer: Self-pay | Admitting: Otolaryngology

## 2020-03-22 DIAGNOSIS — Q315 Congenital laryngomalacia: Secondary | ICD-10-CM | POA: Diagnosis not present

## 2020-03-22 LAB — CBC WITH DIFFERENTIAL/PLATELET
Abs Immature Granulocytes: 0.07 10*3/uL (ref 0.00–0.07)
Basophils Absolute: 0 10*3/uL (ref 0.0–0.1)
Basophils Relative: 0 %
Eosinophils Absolute: 0 10*3/uL (ref 0.0–0.5)
Eosinophils Relative: 0 %
HCT: 27.3 % — ABNORMAL LOW (ref 39.0–52.0)
Hemoglobin: 8.8 g/dL — ABNORMAL LOW (ref 13.0–17.0)
Immature Granulocytes: 1 %
Lymphocytes Relative: 4 %
Lymphs Abs: 0.2 10*3/uL — ABNORMAL LOW (ref 0.7–4.0)
MCH: 23.8 pg — ABNORMAL LOW (ref 26.0–34.0)
MCHC: 32.2 g/dL (ref 30.0–36.0)
MCV: 73.8 fL — ABNORMAL LOW (ref 80.0–100.0)
Monocytes Absolute: 0.3 10*3/uL (ref 0.1–1.0)
Monocytes Relative: 6 %
Neutro Abs: 4.6 10*3/uL (ref 1.7–7.7)
Neutrophils Relative %: 89 %
Platelets: 390 10*3/uL (ref 150–400)
RBC: 3.7 MIL/uL — ABNORMAL LOW (ref 4.22–5.81)
RDW: 17.5 % — ABNORMAL HIGH (ref 11.5–15.5)
WBC: 5.2 10*3/uL (ref 4.0–10.5)
nRBC: 0 % (ref 0.0–0.2)

## 2020-03-22 LAB — GLUCOSE, CAPILLARY
Glucose-Capillary: 120 mg/dL — ABNORMAL HIGH (ref 70–99)
Glucose-Capillary: 133 mg/dL — ABNORMAL HIGH (ref 70–99)
Glucose-Capillary: 101 mg/dL — ABNORMAL HIGH (ref 70–99)
Glucose-Capillary: 108 mg/dL — ABNORMAL HIGH (ref 70–99)
Glucose-Capillary: 123 mg/dL — ABNORMAL HIGH (ref 70–99)
Glucose-Capillary: 109 mg/dL — ABNORMAL HIGH (ref 70–99)
Glucose-Capillary: 110 mg/dL — ABNORMAL HIGH (ref 70–99)

## 2020-03-22 LAB — SURGICAL PATHOLOGY

## 2020-03-22 NOTE — Plan of Care (Signed)
Plan of care reviewed. 

## 2020-03-22 NOTE — Progress Notes (Signed)
Manufacturing engineer [facility]) Hospital Liaison note.    Mr. Faniel has ben referred for hospice services after discharge.  Visited briefly at bedside, pt resting with NAD.  Liaison team will continue to follow to assist with admission onto hospice services and with discharge panning as needed.  Thank you for the opportunity to participate in this patient's care.  Domenic Moras, BSN, RN [facility] Liaison (listed on [place] under Hospice/[facility])    [phone] (24h on call)

## 2020-03-22 NOTE — Progress Notes (Signed)
Nutrition Follow-up  DOCUMENTATION CODES:   Underweight,Severe malnutrition in context of chronic illness  INTERVENTION:   Via PEG: -Jevity 1.5 @ 60 ml/hr  -45 ml Prosource TF BID   Tube feeding regimen provides 2240 kcal (100% of needs), 114 grams of protein, and 1014 ml of H2O.   NEW NUTRITION DIAGNOSIS:   Severe Malnutrition related to chronic illness,cancer and cancer related treatments as evidenced by severe fat depletion,severe muscle depletion.  Ongoing.  GOAL:   Patient will meet greater than or equal to 90% of their needs  Meeting with TF.  MONITOR:   Labs,Weight trends,I & O's,TF tolerance  REASON FOR ASSESSMENT:   Consult Enteral/tube feeding initiation and management  ASSESSMENT:   65 yo male former smoker with laryngeal cancer (poorly differentiated squamous cell) presented to ER with dyspnea, stridor and hypoxia.  Pt admitted with acute hypoxic respiratory failure with stridor for supraglottic laryngeal narrowing in setting of squamous cell laryngral cancer s/p radiation.   1/15- s/p Tracheostomyand direct laryngoscopy with biopsy 1/16- NGT placed and placement verified by x-ray 1/17- NGT removed  Per MD note, plan is for pt to discharge to SNF with hospice following. SLP following for PMV and swallow function. Pt still NPO at this time.  RD received consult today as pt's NGT was removed yesterday and TF was infusing via that tube instead of PEG. Will continue current TF order via PEG. Pt can resume Isosource at facility.  Admission weight: 122 lbs. Current weight: 123 lbs.  Medications: Miralax, Senokot, Thiamine   Labs reviewed: CBGs: 101-133  NUTRITION - FOCUSED PHYSICAL EXAM:  Flowsheet Row Most Recent Value  Orbital Region Moderate depletion  Upper Arm Region Severe depletion  Thoracic and Lumbar Region Severe depletion  Buccal Region Severe depletion  Temple Region No depletion  Clavicle Bone Region Severe depletion  Clavicle  and Acromion Bone Region Severe depletion  Scapular Bone Region Unable to assess  Dorsal Hand Moderate depletion  Patellar Region Severe depletion  Anterior Thigh Region Severe depletion  Posterior Calf Region Severe depletion  Edema (RD Assessment) None  Hair Reviewed  Eyes Reviewed  Mouth Reviewed  Skin Reviewed  Nails Reviewed       Diet Order:   Diet Order            Diet NPO time specified Except for: Ice Chips  Diet effective now                 EDUCATION NEEDS:   No education needs have been identified at this time  Skin:  Skin Assessment: Skin Integrity Issues: Skin Integrity Issues:: Incisions Incisions: closed neck s/p trach  Last BM:  1/18 -type 6  Height:   Ht Readings from Last 1 Encounters:  03/19/20 5\' 11"  (1.803 m)    Weight:   Wt Readings from Last 1 Encounters:  03/22/20 55.9 kg   BMI:  Body mass index is 17.19 kg/m.  Estimated Nutritional Needs:   Kcal:  2000-2200  Protein:  110-125 grams  Fluid:  > 2 L  Clayton Bibles, MS, RD, LDN Inpatient Clinical Dietitian Contact information available via Amion

## 2020-03-22 NOTE — NC FL2 (Signed)
[place] LEVEL OF CARE SCREENING TOOL     IDENTIFICATION  Patient Name: Devon Peterson Birthdate: [birth year] Sex: male Admission Date (Current Location): 03/19/2020  [place] and [place] Number:  Kathleen Argue [id number] [place] and Address:  [facility],  [place] [address], [place]      Provider Number: [id number]  Attending Physician Name and Address:  Shelly Coss, MD  Relative Name and Phone Number:  Philipe Laswell (sister) [phone]    Current Level of Care: Hospital Recommended Level of Care: [place] Prior Approval Number:    Date Approved/Denied:   PASRR Number: [id number]  Discharge Plan: SNF    Current Diagnoses: Patient Active Problem List   Diagnosis Date Noted  . Laryngeal stridor 03/19/2020  . Hypoxia   . Acute cystitis without hematuria   . Squamous cell carcinoma of lung ([place])   . Weakness   . Malignant neoplasm of lower lobe of left lung ([place]) 02/19/2020  . Acute respiratory insufficiency   . Squamous cell carcinoma of neck   . Palliative care encounter   . Tracheostomy care ([place])   . 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 Lung mass   . Neck mass   . Pressure injury of skin 07/08/2019  . Aspiration pneumonia ([place]) 07/07/2019  . Dysphagia 07/07/2019  . Severe protein-calorie malnutrition ([place]) 07/07/2019  . Alcohol use 07/07/2019  . Sepsis ([place]) 04/17/2019  . COPD exacerbation ([place])   . Lactic acidosis   . Tobacco abuse   . Alcohol abuse     Orientation RESPIRATION BLADDER Height & Weight     Self,Time,Situation,Place  O2,Tracheostomy Incontinent,External catheter Weight: 123 lb 3.8 oz (55.9 kg) Height:  5\' 11"  (180.3 cm)  BEHAVIORAL SYMPTOMS/MOOD NEUROLOGICAL BOWEL NUTRITION STATUS      Incontinent Feeding tube  AMBULATORY STATUS COMMUNICATION OF NEEDS Skin   Limited Assist Non-Verbally Surgical wounds                       Personal Care Assistance Level of Assistance   Bathing,Feeding,Dressing Bathing Assistance: Limited assistance Feeding assistance: Limited assistance Dressing Assistance: Limited assistance     Functional Limitations Info  Sight,Hearing,Speech Sight Info: Adequate Hearing Info: Adequate Speech Info: Impaired    SPECIAL CARE FACTORS FREQUENCY  PT (By licensed PT),OT (By licensed OT),Speech therapy     PT Frequency: 5x/wk OT Frequency: 5x/wk     Speech Therapy Frequency: 3x/wk      Contractures Contractures Info: Not present    Additional Factors Info  Code Status,Allergies,Suctioning Needs Code Status Info: Full Allergies Info: see MAR           Current Medications (03/22/2020):  This is the current hospital active medication list Current Facility-Administered Medications  Medication Dose Route Frequency Provider Last Rate Last Admin  . 0.9 %  sodium chloride infusion  250 mL Intravenous Continuous Chesley Mires, MD      . chlorhexidine (PERIDEX) 0.12 % solution 15 mL  15 mL Mouth Rinse BID Chesley Mires, MD   15 mL at 03/22/20 1033  . enoxaparin (LOVENOX) injection 40 mg  40 mg Subcutaneous Q24H Green, Terri L, RPH   40 mg at 03/21/20 1725  . feeding supplement (JEVITY 1.5 CAL/FIBER) liquid 1,000 mL  1,000 mL Per Tube Continuous Shelly Coss, MD 60 mL/hr at 03/21/20 1800 1,000 mL at 03/21/20 1800  . feeding supplement (PROSource TF) liquid 45 mL  45 mL Per Tube BID Chesley Mires, MD  45 mL at 03/22/20 1033  . fentaNYL (SUBLIMAZE) injection 50 mcg  50 mcg Intravenous Q2H PRN Chesley Mires, MD   50 mcg at 03/22/20 0354  . ipratropium-albuterol (DUONEB) 0.5-2.5 (3) MG/3ML nebulizer solution 3 mL  3 mL Nebulization Q4H PRN Noemi Chapel P, DO      . lactated ringers infusion   Intravenous Continuous Chesley Mires, MD 50 mL/hr at 03/22/20 1000 Infusion Verify at 03/22/20 1000  . LORazepam (ATIVAN) injection 1 mg  1 mg Intravenous Q4H PRN Chesley Mires, MD      . MEDLINE mouth rinse  15 mL Mouth Rinse q12n4p Chesley Mires, MD    15 mL at 03/21/20 1725  . oxyCODONE (Oxy IR/ROXICODONE) immediate release tablet 5 mg  5 mg Per Tube Q6H PRN Chesley Mires, MD   5 mg at 03/22/20 1033  . pantoprazole sodium (PROTONIX) 40 mg/20 mL oral suspension 40 mg  40 mg Per Tube q morning - 10a Chesley Mires, MD   40 mg at 03/22/20 1033  . piperacillin-tazobactam (ZOSYN) IVPB 3.375 g  3.375 g Intravenous Q8H Susa Raring, RPH 12.5 mL/hr at 03/22/20 1000 Infusion Verify at 03/22/20 1000  . polyethylene glycol (MIRALAX / GLYCOLAX) packet 17 g  17 g Per Tube Daily Chesley Mires, MD   17 g at 03/22/20 1033  . senna (SENOKOT) tablet 17.2 mg  2 tablet Per Tube Daily Chesley Mires, MD   17.2 mg at 03/22/20 1033  . sertraline (ZOLOFT) tablet 50 mg  50 mg Per Tube Daily Chesley Mires, MD   50 mg at 03/22/20 1034  . thiamine tablet 100 mg  100 mg Per Tube Daily Chesley Mires, MD   100 mg at 03/22/20 1033     Discharge Medications: Please see discharge summary for a list of discharge medications.  Relevant Imaging Results:  Relevant Lab Results:   Additional Information [phone]  Lennart Pall, LCSW

## 2020-03-22 NOTE — Progress Notes (Signed)
PROGRESS NOTE    Devon Peterson  [id number] DOB: March 08, 1955 DOA: 03/19/2020 PCP: Patient, No Pcp Per   Chief Complain:SoB  Brief Narrative: Patient is a 65 year old male with history of COPD, cocaine abuse, tobacco abuse, laryngeal cancer diagnosed in 2021 status post radiation therapy, status post trach and PEG placement, newly diagnosed left squamous cell lung cancer getting radiation therapy, A. fib, aspiration pneumonia who was sent from skilled nursing facility for the evaluation of shortness of breath. He was just discharged from here on 03/18/2020 after prolonged hospitalization.  During that hospitalization, he was found to have aspiration pneumonia, progression of his lung cancer and possible relapse of his laryngeal cancer.  ENT was consulted at the time and he was offered tracheostomy but he declined.  As soon as he went to skilled nursing facility, his saturation dropped and they sent it back.  He underwent emergent tracheostomy by ENT on this admission.  Currently he is hemodynamically stable.  Plan is to discharge him back to skilled facility with hospice follow-up.  TOC following.  Assessment & Plan:   Active Problems:   Laryngeal stridor   Acute hypoxic respiratory failure: Desaturated even on supplemental oxygen.  Sent from skilled nursing facility. Underwent emergent tracheostomy by ENT.  He had declined tracheostomy on last hospitalization.  Admitted under PCCM service.  PCCM following for tracheostomy. CT chest on 03/15/19 showed patchy debris throughout the left lung airways,dense patchy consolidation and volume loss throughout the lingula and left lower lobe with scattered air bronchograms.Findings suggesting combination of aspiration, pneumonia, atelectasis, evolving postradiation change and/or residual tumor.Also showed solid pulmonary nodules scattered throughout the right lung.  Currently his respiratory status is stable.  Continue trach care.  He needs to follow-up  with ENT as an outpatient  Sepsis secondary lobar pneumonia: Suspected aspiration pneumonia on last admission.  Currently he is on Zosyn,day 13/14  Stridor/History of laryngeal cancer/dysphagia: Status post trach but decannulated on 6/21. Developed dysphagia from radiation therapy. Status post PEG. Currently NPO and on tube feeding.  CT soft tissue neck showed circumferential mucosal thickening of the supraglottic larynx with narrowing of the transverse dimension of the airway to 3 mm,Laryngeal thickening has markedly worsened compared to 11/02/2019. New/recurrent tumor or post radiation change is a possibility. ENT where following.  There was plan forTracheostomy as per [name] but patient declined and he was discharged on 1/14 with plan for outpatient follow-up. Underwent emergent tracheostomy during this admission after finding of stridor. He currently does not have a stridor.Speech following for PMV  History of COPD: No wheezing. Continue bronchodilators as needed  Paroxysmal A. fib: Currently rate is controlled. Not on anticoagulation. Currently in normal sinus rhythm.  Severe protein calorie malnutrition:BMI of 17. On tube feeding diet.We recommend being n.p.o.   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 Learta Codding. CT imaging concerning for metastatic disease. On radiation therapy.  Oncology recommended hospice approach.  Normocytic anemia: Most likely secondary to malignancy. Hemoglobin currently in the range of 8,monitor   Goals of care: Extensive discussion held with the patient and his sister about goals of care on last admission.  Patient remains full code.  Patient was interested for home with hospice but due to his poor home environment, hospice could not be arranged.  Plan is to discharge him back to the skilled nursing facility with palliative care follow-up  Nutrition Problem: Inadequate oral intake Etiology: inability to eat       DVT prophylaxis:Lovenox Code Status: Full Family  Communication: none at bedside Status is: Inpatient  Remains inpatient appropriate because:Inpatient level of care appropriate due to severity of illness   Dispo: The patient is from: SNF              Anticipated d/c is to: SNF              Anticipated d/c date is: As soon as bed is available              Patient currently is medically stable for discharge    Consultants: PCCM  Procedures:Tracheostomy  Antimicrobials:  Anti-infectives (From admission, onward)   Start     Dose/Rate Route Frequency Ordered Stop   03/19/20 1213  piperacillin-tazobactam (ZOSYN) 3.375 GM/50ML IVPB       Note to Pharmacy: Estell Harpin   : cabinet override      03/19/20 1213 03/20/20 0014   03/19/20 1000  piperacillin-tazobactam (ZOSYN) IVPB 3.375 g        3.375 g 12.5 mL/hr over 240 Minutes Intravenous Every 8 hours 03/19/20 0852        Subjective: Patient seen and examined at the bedside this morning.  Hemodynamically stable.  Comfortable.  Denies any complaints  Objective: Vitals:   03/21/20 2149 03/22/20 0500 03/22/20 0531 03/22/20 0618  BP: 108/76   112/80  Pulse: 81  78 83  Resp: 16  18 16   Temp: 98 F (36.7 C)   98.3 F (36.8 C)  TempSrc:    Oral  SpO2: 98%  98% 100%  Weight:  55.9 kg    Height:        Intake/Output Summary (Last 24 hours) at 03/22/2020 7793 Last data filed at 03/22/2020 9030 Gross per 24 hour  Intake 2247.64 ml  Output 2300 ml  Net -52.36 ml   Filed Weights   03/21/20 0412 03/22/20 0500  Weight: 55.4 kg 55.9 kg    Examination:  General exam: Chronically ill looking, currently comfortable HEENT: Trach Respiratory system: Bilateral diminished air sounds  cardiovascular system: S1 & S2 heard, RRR. No JVD, murmurs, rubs, gallops or clicks. Gastrointestinal system: Abdomen is nondistended, soft and nontender. No organomegaly or masses felt. Normal bowel sounds heard. Central nervous system: Alert  and oriented. No focal neurological deficits. Extremities: No edema, no clubbing ,no cyanosis Skin: No rashes, lesions or ulcers,no icterus ,no pallor    Data Reviewed: I have personally reviewed following labs and imaging studies  CBC: Recent Labs  Lab 03/16/20 0547 03/19/20 0637 03/22/20 0315  WBC 14.1* 9.0 5.2  NEUTROABS 13.4* 7.9* 4.6  HGB 9.2* 11.3* 8.8*  HCT 29.0* 36.0* 27.3*  MCV 74.6* 74.4* 73.8*  PLT 330 429* 092   Basic Metabolic Panel: Recent Labs  Lab 03/16/20 0547 03/17/20 0605 03/19/20 0637  NA 142 143 140  K 3.3* 3.8 3.5  CL 101 103 97*  CO2 28 28 29   GLUCOSE 189* 202* 110*  BUN 16 17 14   CREATININE 0.57* 0.59* 0.51*  CALCIUM 8.2* 8.5* 8.9  MG  --   --  1.5*   GFR: Estimated Creatinine Clearance: 73.8 mL/min (A) (by C-G formula based on SCr of 0.51 mg/dL (L)). Liver Function Tests: Recent Labs  Lab 03/19/20 0637  AST 21  ALT 15  ALKPHOS 76  BILITOT 0.2*  PROT 7.1  ALBUMIN 2.6*   No results for input(s): LIPASE, AMYLASE in the last 168 hours. No results for input(s): AMMONIA in the last 168 hours. Coagulation Profile: Recent Labs  Lab 03/19/20 [phone]  INR 1.0   Cardiac Enzymes: No results for input(s): CKTOTAL, CKMB, CKMBINDEX, TROPONINI in the last 168 hours. BNP (last 3 results) No results for input(s): PROBNP in the last 8760 hours. HbA1C: No results for input(s): HGBA1C in the last 72 hours. CBG: Recent Labs  Lab 03/21/20 1609 03/21/20 2146 03/22/20 0157 03/22/20 0622 03/22/20 0719  GLUCAP 130* 130* 120* 133* 101*   Lipid Profile: No results for input(s): CHOL, HDL, LDLCALC, TRIG, CHOLHDL, LDLDIRECT in the last 72 hours. Thyroid Function Tests: No results for input(s): TSH, T4TOTAL, FREET4, T3FREE, THYROIDAB in the last 72 hours. Anemia Panel: No results for input(s): VITAMINB12, FOLATE, FERRITIN, TIBC, IRON, RETICCTPCT in the last 72 hours. Sepsis Labs: Recent Labs  Lab 03/19/20 1027  LATICACIDVEN 2.3*    Recent  Results (from the past 240 hour(s))  SARS CORONAVIRUS 2 (TAT 6-24 HRS) Nasopharyngeal Nasopharyngeal Swab     Status: None   Collection Time: 03/18/20  9:16 AM   Specimen: Nasopharyngeal Swab  Result Value Ref Range Status   SARS Coronavirus 2 NEGATIVE NEGATIVE Final    Comment: (NOTE) SARS-CoV-2 target nucleic acids are NOT DETECTED.  The SARS-CoV-2 RNA is generally detectable in upper and lower respiratory specimens during the acute phase of infection. Negative results do not preclude SARS-CoV-2 infection, do not rule out co-infections with other pathogens, and should not be used as the sole basis for treatment or other patient management decisions. Negative results must be combined with clinical observations, patient history, and epidemiological information. The expected result is Negative.  Fact Sheet for Patients: SugarRoll.be  Fact Sheet for Healthcare Providers: https://www.woods-mathews.com/  This test is not yet approved or cleared by the [place] FDA and  has been authorized for detection and/or diagnosis of SARS-CoV-2 by FDA under an Emergency Use Authorization (EUA). This EUA will remain  in effect (meaning this test can be used) for the duration of the COVID-19 declaration under Se ction 564(b)(1) of the Act, 21 U.S.C. section 360bbb-3(b)(1), unless the authorization is terminated or revoked sooner.  Performed at [facility], [place] [address] [place], [place] [address]   Resp panel by RT-PCR (RSV, Flu A&B, Covid) Nasopharyngeal Swab     Status: None   Collection Time: 03/19/20  6:37 AM   Specimen: Nasopharyngeal Swab; Nasopharyngeal(NP) swabs in vial transport medium  Result Value Ref Range Status   SARS Coronavirus 2 by RT PCR NEGATIVE NEGATIVE Final    Comment: (NOTE) SARS-CoV-2 target nucleic acids are NOT DETECTED.  The SARS-CoV-2 RNA is generally detectable in upper respiratory specimens during the acute  phase of infection. The lowest concentration of SARS-CoV-2 viral copies this assay can detect is 138 copies/mL. A negative result does not preclude SARS-Cov-2 infection and should not be used as the sole basis for treatment or other patient management decisions. A negative result may occur with  improper specimen collection/handling, submission of specimen other than nasopharyngeal swab, presence of viral mutation(s) within the areas targeted by this assay, and inadequate number of viral copies(<138 copies/mL). A negative result must be combined with clinical observations, patient history, and epidemiological information. The expected result is Negative.  Fact Sheet for Patients:  EntrepreneurPulse.com.au  Fact Sheet for Healthcare Providers:  IncredibleEmployment.be  This test is no t yet approved or cleared by the [place] FDA and  has been authorized for detection and/or diagnosis of SARS-CoV-2 by FDA under an Emergency Use Authorization (EUA). This EUA will remain  in effect (meaning this test can be  used) for the duration of the COVID-19 declaration under Section 564(b)(1) of the Act, 21 U.S.C.section 360bbb-3(b)(1), unless the authorization is terminated  or revoked sooner.       Influenza A by PCR NEGATIVE NEGATIVE Final   Influenza B by PCR NEGATIVE NEGATIVE Final    Comment: (NOTE) The Xpert Xpress SARS-CoV-2/FLU/RSV plus assay is intended as an aid in the diagnosis of influenza from Nasopharyngeal swab specimens and should not be used as a sole basis for treatment. Nasal washings and aspirates are unacceptable for Xpert Xpress SARS-CoV-2/FLU/RSV testing.  Fact Sheet for Patients: EntrepreneurPulse.com.au  Fact Sheet for Healthcare Providers: IncredibleEmployment.be  This test is not yet approved or cleared by the [place] FDA and has been authorized for detection and/or diagnosis of  SARS-CoV-2 by FDA under an Emergency Use Authorization (EUA). This EUA will remain in effect (meaning this test can be used) for the duration of the COVID-19 declaration under Section 564(b)(1) of the Act, 21 U.S.C. section 360bbb-3(b)(1), unless the authorization is terminated or revoked.     Resp Syncytial Virus by PCR NEGATIVE NEGATIVE Final    Comment: (NOTE) Fact Sheet for Patients: EntrepreneurPulse.com.au  Fact Sheet for Healthcare Providers: IncredibleEmployment.be  This test is not yet approved or cleared by the [place] FDA and has been authorized for detection and/or diagnosis of SARS-CoV-2 by FDA under an Emergency Use Authorization (EUA). This EUA will remain in effect (meaning this test can be used) for the duration of the COVID-19 declaration under Section 564(b)(1) of the Act, 21 U.S.C. section 360bbb-3(b)(1), unless the authorization is terminated or revoked.  Performed at [facility], [place] [address], [place], [place] [address]   Culture, blood (routine x 2)     Status: None (Preliminary result)   Collection Time: 03/19/20  7:00 AM   Specimen: BLOOD  Result Value Ref Range Status   Specimen Description   Final    BLOOD RIGHT ANTECUBITAL Performed at [facility] [address], [place], [place] [address]    Special Requests   Final    BOTTLES DRAWN AEROBIC AND ANAEROBIC Blood Culture results may not be optimal due to an excessive volume of blood received in culture bottles Performed at [facility] [address], [place], [place] [address]    Culture   Final    NO GROWTH 2 DAYS Performed at [facility] [address] [place], [place] [address]    Report Status PENDING  Incomplete         Radiology Studies: DG CHEST PORT 1 VIEW  Result Date: 03/22/2020 CLINICAL DATA:  Shortness of breath. EXAM: PORTABLE CHEST 1 VIEW COMPARISON:  03/20/2020.   CT 03/14/2020. FINDINGS: Tracheostomy tube in stable position. Heart size normal. Persistent left base infiltrate/mass. Pulmonary nodules best identified by prior CT. Unchanged left pleural effusion. No pneumothorax. No acute bony abnormality. IMPRESSION: 1. Tracheostomy tube in stable position. 2. Persistent left base infiltrate/mass. Pulmonary nodules present best identified by prior CT. Persistent left-sided pleural effusion. Chest is unchanged from prior exam. Electronically Signed   By: Marcello Moores  Register   On: 03/22/2020 06:12   DG CHEST PORT 1 VIEW  Result Date: 03/20/2020 CLINICAL DATA:  Orogastric tube advancement. EXAM: PORTABLE CHEST 1 VIEW COMPARISON:  03/20/2020 at 1:35 p.m. FINDINGS: Nasal/orogastric tube has been advanced. Tip extends into the mid stomach and side hole extends below the GE junction. IMPRESSION: Well-positioned nasal/orogastric tube. Electronically Signed   By: Dedra Skeens.D.  On: 03/20/2020 14:27   DG Abd Portable 1V  Result Date: 03/20/2020 CLINICAL DATA:  NG feeding tube placement EXAM: PORTABLE ABDOMEN - 1 VIEW COMPARISON:  CT abdomen pelvis 07/08/2019 FINDINGS: A nasogastric tube courses below the diaphragm with side port at the distal esophagus/GE junction. Bowel gas pattern is nonobstructive. A gastrostomy tube projects over the left hemiabdomen. Left pleural effusion noted. No acute finding in the visualized skeleton. IMPRESSION: Nasogastric tube side port projects at the distal esophagus/GE junction. Recommend advancement into the stomach by approximately 6 cm. Electronically Signed   By: Audie Pinto M.D.   On: 03/20/2020 13:48        Scheduled Meds: . chlorhexidine  15 mL Mouth Rinse BID  . Chlorhexidine Gluconate Cloth  6 each Topical Daily  . enoxaparin (LOVENOX) injection  40 mg Subcutaneous Q24H  . feeding supplement (PROSource TF)  45 mL Per Tube BID  . mouth rinse  15 mL Mouth Rinse q12n4p  . pantoprazole sodium  40 mg Per Tube q morning -  10a  . polyethylene glycol  17 g Per Tube Daily  . senna  2 tablet Per Tube Daily  . sertraline  50 mg Per Tube Daily  . thiamine  100 mg Per Tube Daily   Continuous Infusions: . sodium chloride    . feeding supplement (JEVITY 1.5 CAL/FIBER) 1,000 mL (03/21/20 1800)  . lactated ringers 50 mL/hr at 03/21/20 1820  . piperacillin-tazobactam (ZOSYN)  IV 3.375 g (03/22/20 0622)     LOS: 3 days    Time spent:35 mins. More than 50% of that time was spent in counseling and/or coordination of care.      Shelly Coss, MD [facility] P1/18/2022, 8:12 AM

## 2020-03-23 ENCOUNTER — Inpatient Hospital Stay (HOSPITAL_COMMUNITY): Payer: Medicaid Other

## 2020-03-23 ENCOUNTER — Ambulatory Visit: Payer: Medicaid Other

## 2020-03-23 DIAGNOSIS — Q315 Congenital laryngomalacia: Secondary | ICD-10-CM | POA: Diagnosis not present

## 2020-03-23 LAB — CBC WITH DIFFERENTIAL/PLATELET
Abs Immature Granulocytes: 0.06 10*3/uL (ref 0.00–0.07)
Basophils Absolute: 0 10*3/uL (ref 0.0–0.1)
Basophils Relative: 0 %
Eosinophils Absolute: 0.1 10*3/uL (ref 0.0–0.5)
Eosinophils Relative: 1 %
HCT: 28.6 % — ABNORMAL LOW (ref 39.0–52.0)
Hemoglobin: 9.1 g/dL — ABNORMAL LOW (ref 13.0–17.0)
Immature Granulocytes: 1 %
Lymphocytes Relative: 4 %
Lymphs Abs: 0.2 10*3/uL — ABNORMAL LOW (ref 0.7–4.0)
MCH: 23.5 pg — ABNORMAL LOW (ref 26.0–34.0)
MCHC: 31.8 g/dL (ref 30.0–36.0)
MCV: 73.7 fL — ABNORMAL LOW (ref 80.0–100.0)
Monocytes Absolute: 0.4 10*3/uL (ref 0.1–1.0)
Monocytes Relative: 7 %
Neutro Abs: 4.5 10*3/uL (ref 1.7–7.7)
Neutrophils Relative %: 87 %
Platelets: 359 10*3/uL (ref 150–400)
RBC: 3.88 MIL/uL — ABNORMAL LOW (ref 4.22–5.81)
RDW: 17.7 % — ABNORMAL HIGH (ref 11.5–15.5)
WBC: 5.2 10*3/uL (ref 4.0–10.5)
nRBC: 0 % (ref 0.0–0.2)

## 2020-03-23 LAB — GLUCOSE, CAPILLARY
Glucose-Capillary: 112 mg/dL — ABNORMAL HIGH (ref 70–99)
Glucose-Capillary: 112 mg/dL — ABNORMAL HIGH (ref 70–99)
Glucose-Capillary: 115 mg/dL — ABNORMAL HIGH (ref 70–99)
Glucose-Capillary: 115 mg/dL — ABNORMAL HIGH (ref 70–99)
Glucose-Capillary: 127 mg/dL — ABNORMAL HIGH (ref 70–99)
Glucose-Capillary: 85 mg/dL (ref 70–99)
Glucose-Capillary: 85 mg/dL (ref 70–99)

## 2020-03-23 NOTE — Progress Notes (Signed)
Peg tube clogged, unable to flush after multiple attempts by this Probation officer as well as another Marine scientist. Notified the dr, order entered for interventional radiology, interventional radiology came and was able to unclog tube, feedings have been resumed.

## 2020-03-23 NOTE — Plan of Care (Signed)
Poc discussed with pt

## 2020-03-23 NOTE — Progress Notes (Addendum)
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 fib, aspiration pneumonia who was sent from skilled nursing facility for the evaluation of shortness of breath. He was just discharged from here on 03/18/2020 after prolonged hospitalization.  During that hospitalization, he was found to have aspiration pneumonia, progression of his lung cancer and possible relapse of his laryngeal cancer.  ENT was consulted at the time and he was offered tracheostomy but he declined.  As soon as he went to skilled nursing facility, his saturation dropped and they sent it back.  He underwent emergent tracheostomy by ENT on this admission.  Currently he is hemodynamically stable.  Plan is to discharge him back to skilled facility with hospice follow-up.  TOC following.  Assessment & Plan:   Active Problems:   Laryngeal stridor   Acute hypoxic respiratory failure: Desaturated even on supplemental oxygen.  Sent from skilled nursing facility. Underwent emergent tracheostomy by ENT.  He had declined tracheostomy on last hospitalization.  Admitted under PCCM service.  PCCM following for tracheostomy. CT chest on 03/15/19 showed patchy debris throughout the left lung airways,dense patchy consolidation and volume loss throughout the lingula and left lower lobe with scattered air bronchograms.Findings suggesting combination of aspiration, pneumonia, atelectasis, evolving postradiation change and/or residual tumor.Also showed solid pulmonary nodules scattered throughout the right lung.  Currently his respiratory status is stable.  Continue trach care,PMV.  He needs to  follow-up with ENT ,Dr Judy Pimple an outpatient  Sepsis secondary lobar pneumonia: Suspected aspiration pneumonia on last admission.  Currently he is on Zosyn,day 14/14antibiotics will be stopped.  Stridor/History of laryngeal cancer/dysphagia: Status post trach but decannulated on 6/21. Developed dysphagia from radiation therapy. Status post PEG. Currently NPO and on tube feeding.  CT soft tissue neck showed circumferential mucosal thickening of the supraglottic larynx with narrowing of the transverse dimension of the airway to 3 mm,Laryngeal thickening , markedly worsened compared to 11/02/2019. ENT where following.  There was plan forTracheostomy as per [name]  on last admission but patient declined and he was discharged on 1/14 with plan for outpatient follow-up. Underwent emergent tracheostomy during this admission after finding of stridor. He currently does not have a stridor.Speech following for PMV.  Laryngeal biopsy demonstrated persistent carcinoma.  He needs to follow-up with Dr. Wilburn Cornelia as an outpatient.  PEG malfunction: Persistent clogging of the PEG tube noticed since last admission.  We have requested IR evaluation and replacement of the PEG if needed  History of COPD: No wheezing. Continue bronchodilators as needed  Paroxysmal A. fib: Currently rate is controlled. Not on anticoagulation. Currently in normal sinus rhythm.  Severe protein calorie malnutrition:BMI of 17. On tube feeding diet.We recommend being n.p.o.   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 Learta Codding. CT imaging concerning for metastatic disease. On radiation therapy.  Oncology recommended hospice approach.  Normocytic anemia: Most likely secondary to malignancy. Hemoglobin currently in the range of 8,monitor .  Goals of care: Extensive discussion held with the patient and his sister about goals of care on last admission.  Patient remains full code.  Patient  was interested for home with hospice but due to his poor home environment,  hospice could not be arranged.  Plan is to discharge him back to the skilled nursing facility with palliative care/hospice  follow-up  Nutrition Problem: Severe Malnutrition Etiology: chronic illness,cancer and cancer related treatments      DVT prophylaxis:Lovenox Code Status: Full Family Communication: Sister on phone on 03/23/20 Status is: Inpatient  Remains inpatient appropriate because:Inpatient level of care appropriate due to severity of illness   Dispo: The patient is from: SNF              Anticipated d/c is to: SNF              Anticipated d/c date is: As soon as bed is available              Patient currently is medically stable for discharge    Consultants: PCCM  Procedures:Tracheostomy  Antimicrobials:  Anti-infectives (From admission, onward)   Start     Dose/Rate Route Frequency Ordered Stop   03/19/20 1213  piperacillin-tazobactam (ZOSYN) 3.375 GM/50ML IVPB       Note to Pharmacy: Estell Harpin   : cabinet override      03/19/20 1213 03/20/20 0014   03/19/20 1000  piperacillin-tazobactam (ZOSYN) IVPB 3.375 g        3.375 g 12.5 mL/hr over 240 Minutes Intravenous Every 8 hours 03/19/20 0852 03/23/20 2359      Subjective: Patient seen and examined at bedside this morning.  Hemodynamically stable.  Comfortable.  On room air.  Denies any shortness of breath or cough.  I was notified that PEG is again clogged.  I have consulted IR  Objective: Vitals:   03/23/20 0007 03/23/20 0415 03/23/20 0500 03/23/20 0603  BP:    108/75  Pulse: 92   82  Resp:    16  Temp:    98.7 F (37.1 C)  TempSrc:    Oral  SpO2: 100% 100%  100%  Weight:   53.7 kg   Height:        Intake/Output Summary (Last 24 hours) at 03/23/2020 0805 Last data filed at 03/23/2020 0600 Gross per 24 hour  Intake 1431.52 ml  Output 2600 ml  Net -1168.48 ml   Filed Weights   03/21/20 0412 03/22/20 0500 03/23/20 0500   Weight: 55.4 kg 55.9 kg 53.7 kg    Examination:   General exam: Chronically ill looking, malnourished, overall comfortable HEENT: Trach  respiratory system:  no wheezes or crackles  Cardiovascular system: S1 & S2 heard, RRR. No JVD, murmurs, rubs, gallops or clicks. Gastrointestinal system: Abdomen is nondistended, soft and nontender. No organomegaly or masses felt. Normal bowel sounds heard.  PEG Central nervous system: Alert and awake Extremities: No edema, no clubbing ,no cyanosis Skin: No rashes, lesions or ulcers,no icterus ,no pallor     Data Reviewed: I have personally reviewed following labs and imaging studies  CBC: Recent Labs  Lab 03/19/20 0637 03/22/20 0315 03/23/20 0325  WBC 9.0 5.2 5.2  NEUTROABS 7.9* 4.6 4.5  HGB 11.3* 8.8* 9.1*  HCT 36.0* 27.3* 28.6*  MCV 74.4* 73.8* 73.7*  PLT 429* 390 326   Basic Metabolic Panel: Recent Labs  Lab 03/17/20 0605 03/19/20 0637  NA 143 140  K 3.8 3.5  CL 103 97*  CO2 28 29  GLUCOSE 202* 110*  BUN 17 14  CREATININE 0.59* 0.51*  CALCIUM 8.5* 8.9  MG  --  1.5*   GFR: Estimated Creatinine Clearance: 70.9 mL/min (A) (by C-G formula based on SCr of 0.51 mg/dL (  L)). Liver Function Tests: Recent Labs  Lab 03/19/20 0637  AST 21  ALT 15  ALKPHOS 76  BILITOT 0.2*  PROT 7.1  ALBUMIN 2.6*   No results for input(s): LIPASE, AMYLASE in the last 168 hours. No results for input(s): AMMONIA in the last 168 hours. Coagulation Profile: Recent Labs  Lab 03/19/20 0729  INR 1.0   Cardiac Enzymes: No results for input(s): CKTOTAL, CKMB, CKMBINDEX, TROPONINI in the last 168 hours. BNP (last 3 results) No results for input(s): PROBNP in the last 8760 hours. HbA1C: No results for input(s): HGBA1C in the last 72 hours. CBG: Recent Labs  Lab 03/22/20 1542 03/22/20 2047 03/23/20 0006 03/23/20 0325 03/23/20 0728  GLUCAP 109* 110* 112* 115* 127*   Lipid Profile: No results for input(s): CHOL, HDL, LDLCALC, TRIG,  CHOLHDL, LDLDIRECT in the last 72 hours. Thyroid Function Tests: No results for input(s): TSH, T4TOTAL, FREET4, T3FREE, THYROIDAB in the last 72 hours. Anemia Panel: No results for input(s): VITAMINB12, FOLATE, FERRITIN, TIBC, IRON, RETICCTPCT in the last 72 hours. Sepsis Labs: Recent Labs  Lab 03/19/20 6269  LATICACIDVEN 2.3*    Recent Results (from the past 240 hour(s))  SARS CORONAVIRUS 2 (TAT 6-24 HRS) Nasopharyngeal Nasopharyngeal Swab     Status: None   Collection Time: 03/18/20  9:16 AM   Specimen: Nasopharyngeal Swab  Result Value Ref Range Status   SARS Coronavirus 2 NEGATIVE NEGATIVE Final    Comment: (NOTE) SARS-CoV-2 target nucleic acids are NOT DETECTED.  The SARS-CoV-2 RNA is generally detectable in upper and lower respiratory specimens during the acute phase of infection. Negative results do not preclude SARS-CoV-2 infection, do not rule out co-infections with other pathogens, and should not be used as the sole basis for treatment or other patient management decisions. Negative results must be combined with clinical observations, patient history, and epidemiological information. The expected result is Negative.  Fact Sheet for Patients: SugarRoll.be  Fact Sheet for Healthcare Providers: https://www.woods-mathews.com/  This test is not yet approved or cleared by the [place] FDA and  has been authorized for detection and/or diagnosis of SARS-CoV-2 by FDA under an Emergency Use Authorization (EUA). This EUA will remain  in effect (meaning this test can be used) for the duration of the COVID-19 declaration under Se ction 564(b)(1) of the Act, 21 U.S.C. section 360bbb-3(b)(1), unless the authorization is terminated or revoked sooner.  Performed at [facility], [place] [address], [place], [place] [address]   Resp panel by RT-PCR (RSV, Flu A&B, Covid) Nasopharyngeal Swab     Status: None   Collection Time:  03/19/20  6:37 AM   Specimen: Nasopharyngeal Swab; Nasopharyngeal(NP) swabs in vial transport medium  Result Value Ref Range Status   SARS Coronavirus 2 by RT PCR NEGATIVE NEGATIVE Final    Comment: (NOTE) SARS-CoV-2 target nucleic acids are NOT DETECTED.  The SARS-CoV-2 RNA is generally detectable in upper respiratory specimens during the acute phase of infection. The lowest concentration of SARS-CoV-2 viral copies this assay can detect is 138 copies/mL. A negative result does not preclude SARS-Cov-2 infection and should not be used as the sole basis for treatment or other patient management decisions. A negative result may occur with  improper specimen collection/handling, submission of specimen other than nasopharyngeal swab, presence of viral mutation(s) within the areas targeted by this assay, and inadequate number of viral copies(<138 copies/mL). A negative result must be combined with clinical observations, patient history, and epidemiological information. The expected result is Negative.  Fact Sheet for Patients:  EntrepreneurPulse.com.au  Fact Sheet for Healthcare Providers:  IncredibleEmployment.be  This test is no t yet approved or cleared by the [place] FDA and  has been authorized for detection and/or diagnosis of SARS-CoV-2 by FDA under an Emergency Use Authorization (EUA). This EUA will remain  in effect (meaning this test can be used) for the duration of the COVID-19 declaration under Section 564(b)(1) of the Act, 21 U.S.C.section 360bbb-3(b)(1), unless the authorization is terminated  or revoked sooner.       Influenza A by PCR NEGATIVE NEGATIVE Final   Influenza B by PCR NEGATIVE NEGATIVE Final    Comment: (NOTE) The Xpert Xpress SARS-CoV-2/FLU/RSV plus assay is intended as an aid in the diagnosis of influenza from Nasopharyngeal swab specimens and should not be used as a sole basis for treatment. Nasal washings  and aspirates are unacceptable for Xpert Xpress SARS-CoV-2/FLU/RSV testing.  Fact Sheet for Patients: EntrepreneurPulse.com.au  Fact Sheet for Healthcare Providers: IncredibleEmployment.be  This test is not yet approved or cleared by the [place] FDA and has been authorized for detection and/or diagnosis of SARS-CoV-2 by FDA under an Emergency Use Authorization (EUA). This EUA will remain in effect (meaning this test can be used) for the duration of the COVID-19 declaration under Section 564(b)(1) of the Act, 21 U.S.C. section 360bbb-3(b)(1), unless the authorization is terminated or revoked.     Resp Syncytial Virus by PCR NEGATIVE NEGATIVE Final    Comment: (NOTE) Fact Sheet for Patients: EntrepreneurPulse.com.au  Fact Sheet for Healthcare Providers: IncredibleEmployment.be  This test is not yet approved or cleared by the [place] FDA and has been authorized for detection and/or diagnosis of SARS-CoV-2 by FDA under an Emergency Use Authorization (EUA). This EUA will remain in effect (meaning this test can be used) for the duration of the COVID-19 declaration under Section 564(b)(1) of the Act, 21 U.S.C. section 360bbb-3(b)(1), unless the authorization is terminated or revoked.  Performed at [facility], [place] [address], [place], [place] [address]   Culture, blood (routine x 2)     Status: None (Preliminary result)   Collection Time: 03/19/20  7:00 AM   Specimen: BLOOD  Result Value Ref Range Status   Specimen Description   Final    BLOOD RIGHT ANTECUBITAL Performed at [place] [address] [place], [place] [address]    Special Requests   Final    BOTTLES DRAWN AEROBIC AND ANAEROBIC Blood Culture results may not be optimal due to an excessive volume of blood received in culture bottles Performed at [facility]  [address] [place], [place] [address]    Culture   Final    NO GROWTH 4 DAYS Performed at [facility], [place] [address] [place], [place] [address]    Report Status PENDING  Incomplete         Radiology Studies: DG CHEST PORT 1 VIEW  Result Date: 03/22/2020 CLINICAL DATA:  Shortness of breath. EXAM: PORTABLE CHEST 1 VIEW COMPARISON:  03/20/2020.  CT 03/14/2020. FINDINGS: Tracheostomy tube in stable position. Heart size normal. Persistent left base infiltrate/mass. Pulmonary nodules best identified by prior CT. Unchanged left pleural effusion. No pneumothorax. No acute bony abnormality. IMPRESSION: 1. Tracheostomy tube in stable position. 2. Persistent left base infiltrate/mass. Pulmonary nodules present best identified by prior CT. Persistent left-sided pleural effusion. Chest is unchanged from prior exam. Electronically Signed   By: Marcello Moores  Register   On: 03/22/2020 06:12  Scheduled Meds: . chlorhexidine  15 mL Mouth Rinse BID  . enoxaparin (LOVENOX) injection  40 mg Subcutaneous Q24H  . feeding supplement (PROSource TF)  45 mL Per Tube BID  . mouth rinse  15 mL Mouth Rinse q12n4p  . pantoprazole sodium  40 mg Per Tube q morning - 10a  . polyethylene glycol  17 g Per Tube Daily  . senna  2 tablet Per Tube Daily  . sertraline  50 mg Per Tube Daily  . thiamine  100 mg Per Tube Daily   Continuous Infusions: . sodium chloride    . feeding supplement (JEVITY 1.5 CAL/FIBER) 1,000 mL (03/23/20 0321)  . lactated ringers 50 mL/hr at 03/22/20 1513  . piperacillin-tazobactam (ZOSYN)  IV 3.375 g (03/23/20 8099)     LOS: 4 days    Time spent:35 mins. More than 50% of that time was spent in counseling and/or coordination of care.      Shelly Coss, MD [facility] P1/19/2022, 8:05 AM

## 2020-03-23 NOTE — Progress Notes (Signed)
[name], male   DOB: 1956-01-04, 65 y.o.   MRN: [id number] Patient's gastrostomy tube was unclogged using declogger device; tube subsequently flushed easily with 30 cc saline.  G-tube intact, insertion site clean and dry, no evidence of leakage.  Nurse notified.

## 2020-03-23 NOTE — Progress Notes (Signed)
Subjective: Doing well with tracheostomy.  Objective: Vital signs in last 24 hours: Temp:  [98.6 F (37 C)-99.6 F (37.6 C)] 98.7 F (37.1 C) (01/19 0603) Pulse Rate:  [78-92] 82 (01/19 0603) Resp:  [14-20] 16 (01/19 0603) BP: (104-114)/(71-75) 108/75 (01/19 0603) SpO2:  [95 %-100 %] 97 % (01/19 0836) FiO2 (%):  [28 %] 28 % (01/19 0836) Weight:  [53.7 kg] 53.7 kg (01/19 0500) Wt Readings from Last 1 Encounters:  03/23/20 53.7 kg    Intake/Output from previous day: 01/18 0701 - 01/19 0700 In: 1431.5 [I.V.:1153.6; IV Piggyback:197.9] Out: 2600 [Urine:2600] Intake/Output this shift: Total I/O In: -  Out: 425 [Urine:425]  General appearance: alert, cooperative and no distress Neck: #4 cuffed Shiley in place with cuff partially inflated  Recent Labs    03/22/20 0315 03/23/20 0325  WBC 5.2 5.2  HGB 8.8* 9.1*  HCT 27.3* 28.6*  PLT 390 359    No results for input(s): NA, K, CL, CO2, GLUCOSE, BUN, CREATININE, CALCIUM in the last 72 hours.  Medications: I have reviewed the patient's current medications.  Assessment/Plan: Laryngeal cancer, respiratory distress s/p tracheostomy  Changed trach tube to #4 cuffless flexible Shiley without difficulty.  Airway stable for discharge when ready.  Passy-Muir valve.  Laryngeal biopsy demonstrated persistent carcinoma.  Will need to follow-up with Dr. Wilburn Cornelia as outpatient.   LOS: 4 days   Devon Peterson 03/23/2020, 11:36 AM

## 2020-03-24 DIAGNOSIS — Q315 Congenital laryngomalacia: Secondary | ICD-10-CM | POA: Diagnosis not present

## 2020-03-24 LAB — CULTURE, BLOOD (ROUTINE X 2): Culture: NO GROWTH

## 2020-03-24 LAB — GLUCOSE, CAPILLARY
Glucose-Capillary: 114 mg/dL — ABNORMAL HIGH (ref 70–99)
Glucose-Capillary: 117 mg/dL — ABNORMAL HIGH (ref 70–99)
Glucose-Capillary: 126 mg/dL — ABNORMAL HIGH (ref 70–99)
Glucose-Capillary: 129 mg/dL — ABNORMAL HIGH (ref 70–99)
Glucose-Capillary: 130 mg/dL — ABNORMAL HIGH (ref 70–99)
Glucose-Capillary: 83 mg/dL (ref 70–99)

## 2020-03-24 LAB — CBC WITH DIFFERENTIAL/PLATELET
Abs Immature Granulocytes: 0.06 10*3/uL (ref 0.00–0.07)
Basophils Absolute: 0 10*3/uL (ref 0.0–0.1)
Basophils Relative: 0 %
Eosinophils Absolute: 0.1 10*3/uL (ref 0.0–0.5)
Eosinophils Relative: 1 %
HCT: 28.5 % — ABNORMAL LOW (ref 39.0–52.0)
Hemoglobin: 9.2 g/dL — ABNORMAL LOW (ref 13.0–17.0)
Immature Granulocytes: 1 %
Lymphocytes Relative: 5 %
Lymphs Abs: 0.2 10*3/uL — ABNORMAL LOW (ref 0.7–4.0)
MCH: 23.7 pg — ABNORMAL LOW (ref 26.0–34.0)
MCHC: 32.3 g/dL (ref 30.0–36.0)
MCV: 73.5 fL — ABNORMAL LOW (ref 80.0–100.0)
Monocytes Absolute: 0.4 10*3/uL (ref 0.1–1.0)
Monocytes Relative: 9 %
Neutro Abs: 3.5 10*3/uL (ref 1.7–7.7)
Neutrophils Relative %: 84 %
Platelets: 375 10*3/uL (ref 150–400)
RBC: 3.88 MIL/uL — ABNORMAL LOW (ref 4.22–5.81)
RDW: 17.8 % — ABNORMAL HIGH (ref 11.5–15.5)
WBC: 4.3 10*3/uL (ref 4.0–10.5)
nRBC: 0 % (ref 0.0–0.2)

## 2020-03-24 LAB — BASIC METABOLIC PANEL
Anion gap: 10 (ref 5–15)
BUN: 10 mg/dL (ref 8–23)
CO2: 25 mmol/L (ref 22–32)
Calcium: 8.4 mg/dL — ABNORMAL LOW (ref 8.9–10.3)
Chloride: 97 mmol/L — ABNORMAL LOW (ref 98–111)
Creatinine, Ser: 0.55 mg/dL — ABNORMAL LOW (ref 0.61–1.24)
GFR, Estimated: >60 mL/min (ref >60)
Glucose, Bld: 133 mg/dL — ABNORMAL HIGH (ref 70–99)
Potassium: 4.2 mmol/L (ref 3.5–5.1)
Sodium: 132 mmol/L — ABNORMAL LOW (ref 135–145)

## 2020-03-24 LAB — MAGNESIUM: Magnesium: 1.4 mg/dL — ABNORMAL LOW (ref 1.7–2.4)

## 2020-03-24 MED ORDER — MAGNESIUM SULFATE 2 GM/50ML IV SOLN
2.0000 g | Freq: Once | INTRAVENOUS | Status: AC
  Administered 2020-03-24: 2 g via INTRAVENOUS
  Filled 2020-03-24: qty 50

## 2020-03-24 NOTE — Progress Notes (Signed)
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 fib, aspiration pneumonia who was sent from skilled nursing facility for the evaluation of shortness of breath. He was just discharged from here on 03/18/2020 after prolonged hospitalization.  During that hospitalization, he was found to have aspiration pneumonia, progression of his lung cancer and possible relapse of his laryngeal cancer.  ENT was consulted at the time and he was offered tracheostomy but he declined.  As soon as he went to skilled nursing facility, his saturation dropped and they sent it back.  He underwent emergent tracheostomy by ENT on this admission.  Currently he is hemodynamically stable.  Plan is to discharge him back to skilled facility with hospice follow-up.  TOC following.  Anticipate prolonged hospital stay due to placement issue.  Assessment & Plan:   Active Problems:   Laryngeal stridor   Acute hypoxic respiratory failure:   Sent from skilled nursing facility after becoming hypoxic. Underwent emergent tracheostomy by ENT.  He had declined tracheostomy on last hospitalization.  Admitted under PCCM service.  PCCM following for tracheostomy. CT chest on 03/15/19 showed patchy debris throughout the left lung airways,dense patchy consolidation and volume loss throughout the lingula and left lower lobe with scattered air bronchograms.Findings suggesting combination of aspiration, pneumonia, atelectasis, evolving postradiation change and/or residual tumor.Also showed solid pulmonary nodules scattered throughout the right lung.  Currently his respiratory status is stable.   Continue trach care,PMV.  He needs to follow-up with ENT ,Dr Judy Pimple an outpatient  Sepsis secondary lobar pneumonia: Suspected aspiration pneumonia on last admission.  He finished 14 days course of antibiotics since last admission..  Stridor/History of laryngeal cancer/dysphagia: Status post trach but decannulated on 6/21. Developed dysphagia from radiation therapy. Status post PEG. Currently NPO and on tube feeding.  CT soft tissue neck done on last admission showed circumferential mucosal thickening of the supraglottic larynx with narrowing of the transverse dimension of the airway to 3 mm,Laryngeal thickening , markedly worsened compared to 11/02/2019. ENT were following.  There was plan forTracheostomy as per [name]  on last admission but patient declined and he was discharged on 1/14 with plan for outpatient follow-up. Underwent emergent tracheostomy during this admission after finding of stridor. He currently does not have a stridor.Speech following for PMV.  Laryngeal biopsy demonstrated persistent carcinoma,not a candidate for cancer treatment.  He needs to follow-up with Dr. Wilburn Cornelia as an outpatient.  PEG malfunction: Persistent clogging of the PEG tube noticed since last admission.   IR fixed it  History of COPD: No wheezing. Continue bronchodilators as needed  Paroxysmal A. fib: Currently rate is controlled. Not on anticoagulation. Currently in normal sinus rhythm.  Severe protein calorie malnutrition:BMI of 17. On tube feeding diet.We recommend being n.p.o.   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 Learta Codding. CT imaging concerning for metastatic disease. On radiation therapy.  Oncology recommended hospice approach.  Normocytic anemia: Most likely secondary to malignancy. Hemoglobin currently in the range of 9,monitor .  Hypomagnesia: Supplemented with Mg.Check tomorrow  Goals of care: Extensive discussion held with the  patient and his sister about goals of care on last admission.  Patient remains full  code.  Patient was interested for home with hospice but due to his poor home environment, hospice could not be arranged.  Plan is to discharge him back to the skilled nursing facility with palliative care/hospice  follow-up     Nutrition Problem: Severe Malnutrition Etiology: chronic illness,cancer and cancer related treatments      DVT prophylaxis:Lovenox Code Status: Full Family Communication: Sister on phone on 03/23/20 Status is: Inpatient  Remains inpatient appropriate because:Inpatient level of care appropriate due to severity of illness   Dispo: The patient is from: SNF              Anticipated d/c is to: SNF              Anticipated d/c date is: As soon as bed is available              Patient currently is medically stable for discharge    Consultants: PCCM  Procedures:Tracheostomy  Antimicrobials:  Anti-infectives (From admission, onward)   Start     Dose/Rate Route Frequency Ordered Stop   03/19/20 1213  piperacillin-tazobactam (ZOSYN) 3.375 GM/50ML IVPB       Note to Pharmacy: Estell Harpin   : cabinet override      03/19/20 1213 03/20/20 0014   03/19/20 1000  piperacillin-tazobactam (ZOSYN) IVPB 3.375 g  Status:  Discontinued        3.375 g 12.5 mL/hr over 240 Minutes Intravenous Every 8 hours 03/19/20 0852 03/23/20 1300      Subjective: Patient seen and examined at bedside this morning.  Hemodynamically stable.  Comfortable.  No respiratory issues.  On tracheostomy collar.  Denies any complaints.  Objective: Vitals:   03/24/20 0000 03/24/20 0319 03/24/20 0442 03/24/20 0459  BP: 101/69  104/72   Pulse: 95 98 89   Resp: 18 17 18    Temp:   98.3 F (36.8 C)   TempSrc:   Oral   SpO2: 100% 100% 96%   Weight:    53.2 kg  Height:        Intake/Output Summary (Last 24 hours) at 03/24/2020 0812 Last data filed at 03/24/2020 7169 Gross per 24 hour  Intake 1491.67 ml   Output 2375 ml  Net -883.33 ml   Filed Weights   03/22/20 0500 03/23/20 0500 03/24/20 0459  Weight: 55.9 kg 53.7 kg 53.2 kg    Examination:   General exam: Chronically ill looking, nourished, overall comfortable, HEENT: Trach Respiratory system: Bilateral diminished air sounds but no wheezes or crackles cardiovascular system: S1 & S2 heard, RRR.  Gastrointestinal system: Abdomen is nondistended, soft and nontender PEG Central nervous system: Alert and oriented. No focal neurological deficits. Extremities: No edema, no clubbing ,no cyanosis Skin: no icterus ,no pallor   Data Reviewed: I have personally reviewed following labs and imaging studies  CBC: Recent Labs  Lab 03/19/20 0637 03/22/20 0315 03/23/20 0325 03/24/20 0337  WBC 9.0 5.2 5.2 4.3  NEUTROABS 7.9* 4.6 4.5 3.5  HGB 11.3* 8.8* 9.1* 9.2*  HCT 36.0* 27.3* 28.6* 28.5*  MCV 74.4* 73.8* 73.7* 73.5*  PLT 429* 390 359 678   Basic Metabolic Panel: Recent Labs  Lab 03/19/20 0637 03/24/20 0337  NA 140 132*  K 3.5 4.2  CL 97* 97*  CO2 29 25  GLUCOSE 110* 133*  BUN 14 10  CREATININE 0.51* 0.55*  CALCIUM 8.9 8.4*  MG 1.5* 1.4*   GFR: Estimated Creatinine Clearance: 70.2 mL/min (A) (by C-G formula based on SCr of 0.55 mg/dL (L)).  Liver Function Tests: Recent Labs  Lab 03/19/20 0637  AST 21  ALT 15  ALKPHOS 76  BILITOT 0.2*  PROT 7.1  ALBUMIN 2.6*   No results for input(s): LIPASE, AMYLASE in the last 168 hours. No results for input(s): AMMONIA in the last 168 hours. Coagulation Profile: Recent Labs  Lab 03/19/20 0729  INR 1.0   Cardiac Enzymes: No results for input(s): CKTOTAL, CKMB, CKMBINDEX, TROPONINI in the last 168 hours. BNP (last 3 results) No results for input(s): PROBNP in the last 8760 hours. HbA1C: No results for input(s): HGBA1C in the last 72 hours. CBG: Recent Labs  Lab 03/23/20 1542 03/23/20 1937 03/23/20 2351 03/24/20 0446 03/24/20 0731  GLUCAP 85 115* 112* 129* 126*    Lipid Profile: No results for input(s): CHOL, HDL, LDLCALC, TRIG, CHOLHDL, LDLDIRECT in the last 72 hours. Thyroid Function Tests: No results for input(s): TSH, T4TOTAL, FREET4, T3FREE, THYROIDAB in the last 72 hours. Anemia Panel: No results for input(s): VITAMINB12, FOLATE, FERRITIN, TIBC, IRON, RETICCTPCT in the last 72 hours. Sepsis Labs: Recent Labs  Lab 03/19/20 4174  LATICACIDVEN 2.3*    Recent Results (from the past 240 hour(s))  SARS CORONAVIRUS 2 (TAT 6-24 HRS) Nasopharyngeal Nasopharyngeal Swab     Status: None   Collection Time: 03/18/20  9:16 AM   Specimen: Nasopharyngeal Swab  Result Value Ref Range Status   SARS Coronavirus 2 NEGATIVE NEGATIVE Final    Comment: (NOTE) SARS-CoV-2 target nucleic acids are NOT DETECTED.  The SARS-CoV-2 RNA is generally detectable in upper and lower respiratory specimens during the acute phase of infection. Negative results do not preclude SARS-CoV-2 infection, do not rule out co-infections with other pathogens, and should not be used as the sole basis for treatment or other patient management decisions. Negative results must be combined with clinical observations, patient history, and epidemiological information. The expected result is Negative.  Fact Sheet for Patients: SugarRoll.be  Fact Sheet for Healthcare Providers: https://www.woods-mathews.com/  This test is not yet approved or cleared by the [place] FDA and  has been authorized for detection and/or diagnosis of SARS-CoV-2 by FDA under an Emergency Use Authorization (EUA). This EUA will remain  in effect (meaning this test can be used) for the duration of the COVID-19 declaration under Se ction 564(b)(1) of the Act, 21 U.S.C. section 360bbb-3(b)(1), unless the authorization is terminated or revoked sooner.  Performed at [facility], [place] [address], [place], [place] [address]   Resp panel by RT-PCR (RSV, Flu  A&B, Covid) Nasopharyngeal Swab     Status: None   Collection Time: 03/19/20  6:37 AM   Specimen: Nasopharyngeal Swab; Nasopharyngeal(NP) swabs in vial transport medium  Result Value Ref Range Status   SARS Coronavirus 2 by RT PCR NEGATIVE NEGATIVE Final    Comment: (NOTE) SARS-CoV-2 target nucleic acids are NOT DETECTED.  The SARS-CoV-2 RNA is generally detectable in upper respiratory specimens during the acute phase of infection. The lowest concentration of SARS-CoV-2 viral copies this assay can detect is 138 copies/mL. A negative result does not preclude SARS-Cov-2 infection and should not be used as the sole basis for treatment or other patient management decisions. A negative result may occur with  improper specimen collection/handling, submission of specimen other than nasopharyngeal swab, presence of viral mutation(s) within the areas targeted by this assay, and inadequate number of viral copies(<138 copies/mL). A negative result must be combined with clinical observations, patient history, and epidemiological information. The expected result is Negative.  Fact  Sheet for Patients:  EntrepreneurPulse.com.au  Fact Sheet for Healthcare Providers:  IncredibleEmployment.be  This test is no t yet approved or cleared by the [place] FDA and  has been authorized for detection and/or diagnosis of SARS-CoV-2 by FDA under an Emergency Use Authorization (EUA). This EUA will remain  in effect (meaning this test can be used) for the duration of the COVID-19 declaration under Section 564(b)(1) of the Act, 21 U.S.C.section 360bbb-3(b)(1), unless the authorization is terminated  or revoked sooner.       Influenza A by PCR NEGATIVE NEGATIVE Final   Influenza B by PCR NEGATIVE NEGATIVE Final    Comment: (NOTE) The Xpert Xpress SARS-CoV-2/FLU/RSV plus assay is intended as an aid in the diagnosis of influenza from Nasopharyngeal swab specimens  and should not be used as a sole basis for treatment. Nasal washings and aspirates are unacceptable for Xpert Xpress SARS-CoV-2/FLU/RSV testing.  Fact Sheet for Patients: EntrepreneurPulse.com.au  Fact Sheet for Healthcare Providers: IncredibleEmployment.be  This test is not yet approved or cleared by the [place] FDA and has been authorized for detection and/or diagnosis of SARS-CoV-2 by FDA under an Emergency Use Authorization (EUA). This EUA will remain in effect (meaning this test can be used) for the duration of the COVID-19 declaration under Section 564(b)(1) of the Act, 21 U.S.C. section 360bbb-3(b)(1), unless the authorization is terminated or revoked.     Resp Syncytial Virus by PCR NEGATIVE NEGATIVE Final    Comment: (NOTE) Fact Sheet for Patients: EntrepreneurPulse.com.au  Fact Sheet for Healthcare Providers: IncredibleEmployment.be  This test is not yet approved or cleared by the [place] FDA and has been authorized for detection and/or diagnosis of SARS-CoV-2 by FDA under an Emergency Use Authorization (EUA). This EUA will remain in effect (meaning this test can be used) for the duration of the COVID-19 declaration under Section 564(b)(1) of the Act, 21 U.S.C. section 360bbb-3(b)(1), unless the authorization is terminated or revoked.  Performed at [facility], [place] [address] [place], [place] [address]   Culture, blood (routine x 2)     Status: None   Collection Time: 03/19/20  7:00 AM   Specimen: BLOOD  Result Value Ref Range Status   Specimen Description   Final    BLOOD RIGHT ANTECUBITAL Performed at [facility] [address], [place], [place] [address]    Special Requests   Final    BOTTLES DRAWN AEROBIC AND ANAEROBIC Blood Culture results may not be optimal due to an excessive volume of blood received in culture bottles Performed  at [facility] [address] [place], [place] [address]    Culture   Final    NO GROWTH 5 DAYS Performed at [facility], [place] [address] [place], [place] [address]    Report Status 03/24/2020 FINAL  Final         Radiology Studies: No results found.   Scheduled Meds: . chlorhexidine  15 mL Mouth Rinse BID  . enoxaparin (LOVENOX) injection  40 mg Subcutaneous Q24H  . feeding supplement (PROSource TF)  45 mL Per Tube BID  . mouth rinse  15 mL Mouth Rinse q12n4p  . pantoprazole sodium  40 mg Per Tube q morning - 10a  . polyethylene glycol  17 g Per Tube Daily  . senna  2 tablet Per Tube Daily  . sertraline  50 mg Per Tube Daily  . thiamine  100 mg Per Tube Daily   Continuous Infusions: .  sodium chloride    . feeding supplement (JEVITY 1.5 CAL/FIBER) 1,000 mL (03/24/20 0156)  . lactated ringers 50 mL/hr at 03/24/20 0608  . magnesium sulfate bolus IVPB       LOS: 5 days    Time spent:35 mins. More than 50% of that time was spent in counseling and/or coordination of care.      Shelly Coss, MD [facility] P1/20/2022, 8:12 AM

## 2020-03-24 NOTE — Progress Notes (Addendum)
Speech Language Pathology Treatment: Speech/Swallow Patient Details Name: Devon Peterson MRN: [id number] DOB: 1955-04-19 Today's Date: 03/24/2020 Time: 7616-0737 SLP Time Calculation (min) (ACUTE ONLY): 18 min  Assessment / Plan / Recommendation Clinical Impression  Education re: importance of oral hygeine including providing him with and observing to assure he adequately brushed his gums/tongue.  Note black/yellowish coating on tongue - ? source.  Encouraged pt to assure he is brushing at least QID and reevaluating tongue coating frequently to inform MD of findings.  P then consumed single ice chip boluses - with delayed strong coughing and expectoration of viscous secretions through trachea.  SlP uncertain if ice chip intake mobilized retained secretions?  Cough was strong fortunately.  During prior admit, this SLP had previously created pt an apparatus for trismus which he now states is lost. He is interested in obtaining another and SLP will plan to fashion another one.  Given pt's ongoing cancer, and decreased tolerance of PMSV, SLP also addressed expressive communication avenues with pt. Provided him with a communication board to which he demonstrated appropriate usage when provided with specific requests.  Encouraged him to use prn.  In addition, demonstrated a free app on cell phone called "Daily Activities" by [employer] which talks for the pt.   When asked if pt knew of anyone/family that may be able to purchase him an iphone, he advised yes.  Left information in writing re: the Free App.  Given uncertainty if pt will tolerate PMSV any time within the near future, and pt's intact cognition, a smart phone with app may help with efficient communication and improve pt's QOL.  SLP reviewed pt's prior MBS study flouro loops and written reports from 08/2019, 09/2019 and now 03/14/2020.  Would highly recommend to repeat an MBS while pt in house to determine if he may be appropriate for any po intake.   Concern for tissue atrophy/worsening fibrosis present without use or exercises.  MD please order MBS if you agree. Thanks.    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 Per MD noted, tracheostomy placed on 07/09/19 and decannulated on 08/26/19; radiation to larynx from 07/23/19 to 08/21/19. Found to have 2.7 cm mass in LLL on CT with biopsy 02/11/20 showed squamous cell carcinoma more concerning for additional lung primary rather than metastatic disease. Admitted from 03/10/20 to 03/18/20 with stridor and dyspnea and tracheostomy planned 03/17/20  > pt refused then readmitted with dyspnea, stridor and hypoxia underwent emergent tracheostomy. ENT findings: very firm/dense anterior tracheal wall.  Necrotic right hemilarynx down to glottis and involving anterior and medial pyriform sinus and with partial erosion of right edge of epiglottis. Glottis not well-visualized.  Post-cricoid hypopharynx tight and firm. Seen prior bt ST for PMV and dysphagia.  Pt underwent MBS that showed severe aspiration and he was made npo x ice.  He has been seen for PMSV trial but was unable to tolerate. Follow up for PMSV indicated.  Follow up for dysphagia also indicated.      SLP Plan  Continue with current plan of care;MBS (MD please order MBS)       Recommendations  Diet recommendations:  (ice) Medication Administration: Via alternative means      Patient may use Passy-Muir Speech Valve: with SLP only PMSV Supervision: Full         Follow up Recommendations: Skilled Nursing facility;24 hour supervision/assistance SLP Visit Diagnosis: Aphonia (R49.1);Dysphagia, oropharyngeal phase (R13.12);Dysphagia, pharyngoesophageal phase (R13.14) Plan: Continue with current plan of care;MBS (MD please order  MBS)       GO               Kathleen Lime, MS [facility] SLP Acute Rehab Services Office [phone] Pager [phone]   [name] 03/24/2020, 9:15 PM

## 2020-03-24 NOTE — Progress Notes (Addendum)
  Speech Language Pathology Treatment: [name] Speaking valve  Patient Details Name: Devon Peterson MRN: [id number] DOB: [birth year] Today's Date: 03/24/2020 Time: 1715-1740 SLP Time Calculation (min) (ACUTE ONLY): 25 min  Assessment / Plan / Recommendation Clinical Impression  Pt's trach was changed to number four Shiley Cuffless yesterday, thus SLP was hopeful he may tolerate PMSV better.  Pt communicates via head nods, etc as he is aphonic.  He did indicated that he has required some tracheal suctioning.  Cues to cough did not elicit expectoration of secretions via trach.    With occlusion of trach, pt able to transit minimal amount of airflow to upper airway evidenced by condensation on mirror.  He actually produces more airflow orally without occlusion of trach causing SLP to suspect the further narrowed airway is negatively impacting airflow to upper airway with occlusion.    SLP placed valve on pt for approximately 10 seconds, no phonation heard and overt air trapping noted with removal of valve. Pt admits to difficulty breathing with PMSV in place.   At this time, will continue effort with PMSV but uncertain if pt will be able to tolerate.  PMSV ONLY WITH SLP.   Provided pt a mirror to use on strengthening oral airflow. Will continue efforts.    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 Per MD noted, tracheostomy placed on 07/09/19 and decannulated on 08/26/19; radiation to larynx from 07/23/19 to 08/21/19. Found to have 2.7 cm mass in LLL on CT with biopsy 02/11/20 showed squamous cell carcinoma more concerning for additional lung primary rather than metastatic disease. Admitted from 03/10/20 to 03/18/20 with stridor and dyspnea and tracheostomy planned 03/17/20  > pt refused then readmitted with dyspnea, stridor and hypoxia underwent emergent tracheostomy. ENT findings: very firm/dense anterior tracheal wall.  Necrotic right hemilarynx down to glottis and involving anterior and  medial pyriform sinus and with partial erosion of right edge of epiglottis. Glottis not well-visualized.  Post-cricoid hypopharynx tight and firm. Seen prior bt ST for PMV and dysphagia.  Pt underwent MBS that showed severe aspiration and he was made npo x ice.  He has been seen for PMSV trial but was unable to tolerate. Follow up for PMSV indicated.      SLP Plan  Continue with current plan of care       Recommendations         Patient may use Passy-Muir Speech Valve: with SLP only PMSV Supervision: Full         Follow up Recommendations: Skilled Nursing facility;24 hour supervision/assistance SLP Visit Diagnosis: Aphonia (R49.1) Plan: Continue with current plan of care       GO               Kathleen Lime, MS [place] Office [phone] Pager [phone]   [name] 03/24/2020, 8:42 PM

## 2020-03-25 DIAGNOSIS — Z931 Gastrostomy status: Secondary | ICD-10-CM

## 2020-03-25 DIAGNOSIS — E43 Unspecified severe protein-calorie malnutrition: Secondary | ICD-10-CM

## 2020-03-25 DIAGNOSIS — K9423 Gastrostomy malfunction: Secondary | ICD-10-CM | POA: Diagnosis present

## 2020-03-25 DIAGNOSIS — I480 Paroxysmal atrial fibrillation: Secondary | ICD-10-CM | POA: Diagnosis present

## 2020-03-25 DIAGNOSIS — J9811 Atelectasis: Secondary | ICD-10-CM | POA: Diagnosis present

## 2020-03-25 DIAGNOSIS — R1312 Dysphagia, oropharyngeal phase: Secondary | ICD-10-CM | POA: Diagnosis not present

## 2020-03-25 DIAGNOSIS — J9621 Acute and chronic respiratory failure with hypoxia: Secondary | ICD-10-CM

## 2020-03-25 DIAGNOSIS — J700 Acute pulmonary manifestations due to radiation: Secondary | ICD-10-CM | POA: Diagnosis present

## 2020-03-25 DIAGNOSIS — D649 Anemia, unspecified: Secondary | ICD-10-CM | POA: Diagnosis present

## 2020-03-25 LAB — BASIC METABOLIC PANEL
Anion gap: 11 (ref 5–15)
BUN: 10 mg/dL (ref 8–23)
CO2: 25 mmol/L (ref 22–32)
Calcium: 8.6 mg/dL — ABNORMAL LOW (ref 8.9–10.3)
Chloride: 95 mmol/L — ABNORMAL LOW (ref 98–111)
Creatinine, Ser: 0.44 mg/dL — ABNORMAL LOW (ref 0.61–1.24)
GFR, Estimated: >60 mL/min (ref >60)
Glucose, Bld: 123 mg/dL — ABNORMAL HIGH (ref 70–99)
Potassium: 4.3 mmol/L (ref 3.5–5.1)
Sodium: 131 mmol/L — ABNORMAL LOW (ref 135–145)

## 2020-03-25 LAB — GLUCOSE, CAPILLARY
Glucose-Capillary: 121 mg/dL — ABNORMAL HIGH (ref 70–99)
Glucose-Capillary: 131 mg/dL — ABNORMAL HIGH (ref 70–99)
Glucose-Capillary: 134 mg/dL — ABNORMAL HIGH (ref 70–99)
Glucose-Capillary: 137 mg/dL — ABNORMAL HIGH (ref 70–99)
Glucose-Capillary: 140 mg/dL — ABNORMAL HIGH (ref 70–99)
Glucose-Capillary: 88 mg/dL (ref 70–99)

## 2020-03-25 LAB — MAGNESIUM: Magnesium: 1.7 mg/dL (ref 1.7–2.4)

## 2020-03-25 NOTE — Progress Notes (Signed)
We had been following weekly. See ENT saw again on 1/19 trach was changed. He looks comfortable  As he is followed  By ENT we will just be available As needed.    Erick Colace ACNP-BC [place] Pager # [phone] OR # [phone] if no answer

## 2020-03-25 NOTE — Progress Notes (Addendum)
Progress Note    Aneesh Faller  [id number] DOB: 08/15/1955  DOA: 03/19/2020 PCP: Patient, No Pcp Per    Brief Narrative:   Chief complaint: Shortness of breath  Medical records reviewed and are as summarized below:  Quinterrius Errington is an 65 y.o. 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 fib, aspiration pneumonia who was sent from skilled nursing facility for the evaluation of shortness of breath. He was just discharged from here on 03/18/2020 after prolonged hospitalization.  During that hospitalization, he was found to have aspiration pneumonia, progression of his lung cancer and possible relapse of his laryngeal cancer.  ENT was consulted at the time and he was offered tracheostomy but he declined.  Upon admission to skilled nursing facility, his saturation dropped and they sent him back to the hospital for evaluation.  He underwent emergent tracheostomy by ENT on this admission.  Currently he is hemodynamically stable.  Plan is to discharge him back to skilled facility with hospice follow-up.  TOC following.  Anticipate prolonged hospital stay due to placement issue.  Assessment/Plan:   Principle problem: Acute hypoxic respiratory failure and stridor in the setting of laryngeal cancer with acute aspiration pneumonia, atelectasis and radiation pneumonitis all contributory Sent from skilled nursing facility after becoming hypoxic. Underwent emergent tracheostomy by ENT.  He had declined tracheostomy on last hospitalization.  Admitted under PCCM service.  PCCM following for tracheostomy. CT chest on 03/15/19 showed patchy debris throughout the left lung airways,dense patchy consolidation and volume loss throughout the lingula and left lower lobe with scattered air bronchograms. Findings  suggested a combination of aspiration, pneumonia, atelectasis, evolving postradiation change and/or residual tumor. Also showed solid pulmonary nodules scattered throughout the right lung. Currently his respiratory status is stable.  Continue trach care, PMV.  He needs to follow-up with ENT, Dr Wilburn Cornelia, as an outpatient.  Active problems: Sepsis secondary lobar pneumonia Suspected aspiration pneumonia on last admission.  He finished a 14 day course of antibiotics.  Stridor/History of laryngeal cancer/dysphagia Status post trach but decannulated on 08/24/19. Developed dysphagia from radiation therapy. Status post PEG. Currently NPO and on tube feeding.  CT soft tissue neck done on last admission showed circumferential mucosal thickening of the supraglottic larynx with narrowing of the transverse dimension of the airway to 3 mm,Laryngeal thickening, markedly worsened compared to 11/02/2019. ENTwasfollowing.There was plan forTracheostomy as per Dr Kirkland Hun last admission but patient declined and he was discharged on 03/18/20 with plan for outpatient follow-up. Underwent emergent tracheostomy during this admission returning to the hospital with stridor and respiratory distress. He currently does not have a stridor.Speech following for PMV.  Laryngeal biopsy demonstrated persistent carcinoma,not a candidate for cancer treatment.  He needs to follow-up with Dr. Wilburn Cornelia as an outpatient.  PEG malfunction Persistent clogging of the PEG tube noticed since last admission.   IR performed repair.  History of COPD No wheezing. Continue bronchodilators as needed.  Paroxysmal A. Fib Currently rate is controlled. Not on anticoagulation. Currently in normal sinus rhythm.  Recently diagnosed right squamous cell lung cancer Follows with Dr. Learta Codding. CT imaging concerning for metastatic disease. On radiation therapy.Oncology recommended hospice care.  Normocytic anemia Most likely  secondary to malignancy. Hemoglobin currently in the range of 9, continue to monitor.  Hypomagnesia Supplemented.  Monitor periodically.  Goals of care:Extensive  discussion held with the patient and his sister about goals of care on last admission.Patient remains full code.Patient was interested for home with hospice but due to his poor home environment, hospice could not be arranged. Plan is to discharge him back to the skilled nursing facility with palliative care/hospice follow-up  Severe protein calorie malnutrition/underweight On tube feeding diet. Has cancer related cachexia and severe fat/muscle loss on physical exam.  Dietician following.  Agree with assessment and recommendations noted below. High risk for skin breakdown. Air mattress overlay ordered. Nutritional status Nutrition Problem: Severe Malnutrition Etiology: chronic illness,cancer and cancer related treatments Signs/Symptoms: severe fat depletion,severe muscle depletion Interventions: Tube feeding,Prostat  Body mass index is 16.36 kg/m.   Family Communication/Anticipated D/C date and plan/Code Status   DVT prophylaxis: enoxaparin (LOVENOX) injection 40 mg Start: 03/21/20 1600 Place and maintain sequential compression device Start: 03/19/20 0810   Current Level of Care:: Level of care: Med-Surg Code Status: Full Code.  Family Communication: Patient updated today, no family present. Disposition Plan: Status is: Inpatient  Remains inpatient appropriate because:No safe d/c plan identified.   Dispo: The patient is from: SNF              Anticipated d/c is to: SNF              Anticipated d/c date is: 1 day              Patient currently is medically stable to d/c.  Medical Consultants:    ENT  PCCM  Palliative care   Anti-Infectives:    None  Subjective:   Denies SOB, cough, nausea, vomiting, pain. RN reports sacral tissue injury. Tolerating TFs with multiple stools.  Objective:     Vitals:   03/24/20 2345 03/25/20 0342 03/25/20 0358 03/25/20 0759  BP:   108/72   Pulse: 88 93 96 (!) 103  Resp: 16 15 16 15   Temp:   98.9 F (37.2 C)   TempSrc:   Oral   SpO2: 99% 98% 100% 100%  Weight:      Height:        Intake/Output Summary (Last 24 hours) at 03/25/2020 0814 Last data filed at 03/25/2020 0600 Gross per 24 hour  Intake 220 ml  Output 2075 ml  Net -1855 ml   Filed Weights   03/22/20 0500 03/23/20 0500 03/24/20 0459  Weight: 55.9 kg 53.7 kg 53.2 kg    Exam: General: No acute distress. Cachectic appearing with bitemporal muscle loss, generalized muscle and fat loss.  Cardiovascular: Heart sounds show a regular rate, and rhythm. No gallops or rubs. No murmurs. No JVD. Lungs: Clear to auscultation bilaterally with good air movement. No rales, rhonchi or wheezes. Trach collar in place. Abdomen: Soft, nontender, nondistended with normal active bowel sounds. No masses. No hepatosplenomegaly. PEG present. Neurological: Alert and oriented 2. Moves all extremities 4 with diminished strength. Cranial nerves II through XII grossly intact. Skin: Warm and dry. Sacral skin beginning to show signs of tissue injury. Extremities: No clubbing or cyanosis. No edema. Pedal pulses 2+. Psychiatric: Mood and affect are normal. Insight and judgment are fair.         Data Reviewed:   I have personally reviewed following labs and imaging studies:  Labs: Labs show the following:   Basic Metabolic Panel: Recent Labs  Lab 03/19/20 0637 03/24/20 0337 03/25/20 0337  NA 140 132* 131*  K 3.5 4.2 4.3  CL 97* 97* 95*  CO2 29 25 25   GLUCOSE  110* 133* 123*  BUN 14 10 10   CREATININE 0.51* 0.55* 0.44*  CALCIUM 8.9 8.4* 8.6*  MG 1.5* 1.4* 1.7   GFR Estimated Creatinine Clearance: 70.2 mL/min (A) (by C-G formula based on SCr of 0.44 mg/dL (L)). Liver Function Tests: Recent Labs  Lab 03/19/20 0637  AST 21  ALT 15  ALKPHOS 76  BILITOT 0.2*  PROT 7.1  ALBUMIN  2.6*   Coagulation profile Recent Labs  Lab 03/19/20 0729  INR 1.0    CBC: Recent Labs  Lab 03/19/20 0637 03/22/20 0315 03/23/20 0325 03/24/20 0337  WBC 9.0 5.2 5.2 4.3  NEUTROABS 7.9* 4.6 4.5 3.5  HGB 11.3* 8.8* 9.1* 9.2*  HCT 36.0* 27.3* 28.6* 28.5*  MCV 74.4* 73.8* 73.7* 73.5*  PLT 429* 390 359 375   CBG: Recent Labs  Lab 03/24/20 1557 03/24/20 2009 03/24/20 2347 03/25/20 0356 03/25/20 0734  GLUCAP 130* 83 114* 140* 137*   Sepsis Labs: Recent Labs  Lab 03/19/20 0637 03/22/20 0315 03/23/20 0325 03/24/20 0337  WBC 9.0 5.2 5.2 4.3  LATICACIDVEN 2.3*  --   --   --     Microbiology Recent Results (from the past 240 hour(s))  SARS CORONAVIRUS 2 (TAT 6-24 HRS) Nasopharyngeal Nasopharyngeal Swab     Status: None   Collection Time: 03/18/20  9:16 AM   Specimen: Nasopharyngeal Swab  Result Value Ref Range Status   SARS Coronavirus 2 NEGATIVE NEGATIVE Final    Comment: (NOTE) SARS-CoV-2 target nucleic acids are NOT DETECTED.  The SARS-CoV-2 RNA is generally detectable in upper and lower respiratory specimens during the acute phase of infection. Negative results do not preclude SARS-CoV-2 infection, do not rule out co-infections with other pathogens, and should not be used as the sole basis for treatment or other patient management decisions. Negative results must be combined with clinical observations, patient history, and epidemiological information. The expected result is Negative.  Fact Sheet for Patients: SugarRoll.be  Fact Sheet for Healthcare Providers: https://www.woods-mathews.com/  This test is not yet approved or cleared by the [place] FDA and  has been authorized for detection and/or diagnosis of SARS-CoV-2 by FDA under an Emergency Use Authorization (EUA). This EUA will remain  in effect (meaning this test can be used) for the duration of the COVID-19 declaration under Se ction 564(b)(1) of the  Act, 21 U.S.C. section 360bbb-3(b)(1), unless the authorization is terminated or revoked sooner.  Performed at [facility], [place] [address], [place], [place] [address]   Resp panel by RT-PCR (RSV, Flu A&B, Covid) Nasopharyngeal Swab     Status: None   Collection Time: 03/19/20  6:37 AM   Specimen: Nasopharyngeal Swab; Nasopharyngeal(NP) swabs in vial transport medium  Result Value Ref Range Status   SARS Coronavirus 2 by RT PCR NEGATIVE NEGATIVE Final    Comment: (NOTE) SARS-CoV-2 target nucleic acids are NOT DETECTED.  The SARS-CoV-2 RNA is generally detectable in upper respiratory specimens during the acute phase of infection. The lowest concentration of SARS-CoV-2 viral copies this assay can detect is 138 copies/mL. A negative result does not preclude SARS-Cov-2 infection and should not be used as the sole basis for treatment or other patient management decisions. A negative result may occur with  improper specimen collection/handling, submission of specimen other than nasopharyngeal swab, presence of viral mutation(s) within the areas targeted by this assay, and inadequate number of viral copies(<138 copies/mL). A negative result must be combined with clinical observations, patient history, and epidemiological information. The expected result  is Negative.  Fact Sheet for Patients:  EntrepreneurPulse.com.au  Fact Sheet for Healthcare Providers:  IncredibleEmployment.be  This test is no t yet approved or cleared by the [place] FDA and  has been authorized for detection and/or diagnosis of SARS-CoV-2 by FDA under an Emergency Use Authorization (EUA). This EUA will remain  in effect (meaning this test can be used) for the duration of the COVID-19 declaration under Section 564(b)(1) of the Act, 21 U.S.C.section 360bbb-3(b)(1), unless the authorization is terminated  or revoked sooner.       Influenza A by PCR NEGATIVE NEGATIVE  Final   Influenza B by PCR NEGATIVE NEGATIVE Final    Comment: (NOTE) The Xpert Xpress SARS-CoV-2/FLU/RSV plus assay is intended as an aid in the diagnosis of influenza from Nasopharyngeal swab specimens and should not be used as a sole basis for treatment. Nasal washings and aspirates are unacceptable for Xpert Xpress SARS-CoV-2/FLU/RSV testing.  Fact Sheet for Patients: EntrepreneurPulse.com.au  Fact Sheet for Healthcare Providers: IncredibleEmployment.be  This test is not yet approved or cleared by the [place] FDA and has been authorized for detection and/or diagnosis of SARS-CoV-2 by FDA under an Emergency Use Authorization (EUA). This EUA will remain in effect (meaning this test can be used) for the duration of the COVID-19 declaration under Section 564(b)(1) of the Act, 21 U.S.C. section 360bbb-3(b)(1), unless the authorization is terminated or revoked.     Resp Syncytial Virus by PCR NEGATIVE NEGATIVE Final    Comment: (NOTE) Fact Sheet for Patients: EntrepreneurPulse.com.au  Fact Sheet for Healthcare Providers: IncredibleEmployment.be  This test is not yet approved or cleared by the [place] FDA and has been authorized for detection and/or diagnosis of SARS-CoV-2 by FDA under an Emergency Use Authorization (EUA). This EUA will remain in effect (meaning this test can be used) for the duration of the COVID-19 declaration under Section 564(b)(1) of the Act, 21 U.S.C. section 360bbb-3(b)(1), unless the authorization is terminated or revoked.  Performed at [facility], [place] [address], [place], [place] [address]   Culture, blood (routine x 2)     Status: None   Collection Time: 03/19/20  7:00 AM   Specimen: BLOOD  Result Value Ref Range Status   Specimen Description   Final    BLOOD RIGHT ANTECUBITAL Performed at [place] [address]  [place], [place] [address]    Special Requests   Final    BOTTLES DRAWN AEROBIC AND ANAEROBIC Blood Culture results may not be optimal due to an excessive volume of blood received in culture bottles Performed at [place] [address] [place], [place] [address]    Culture   Final    NO GROWTH 5 DAYS Performed at [facility], [place] [address], [place],  [address]    Report Status 03/24/2020 FINAL  Final    Procedures and diagnostic studies:  No results found.  Medications:    chlorhexidine  15 mL Mouth Rinse BID   enoxaparin (LOVENOX) injection  40 mg Subcutaneous Q24H   feeding supplement (PROSource TF)  45 mL Per Tube BID   mouth rinse  15 mL Mouth Rinse q12n4p   pantoprazole sodium  40 mg Per Tube q morning - 10a   polyethylene glycol  17 g Per Tube Daily   senna  2 tablet Per Tube Daily   sertraline  50 mg Per Tube Daily   thiamine  100 mg Per Tube Daily   Continuous  Infusions:  sodium chloride     feeding supplement (JEVITY 1.5 CAL/FIBER) 1,000 mL (03/24/20 2138)     LOS: 6 days   Jacquelynn Cree, MD  [facility]   [facility] How to contact the [facility] Attending or Consulting provider [place] or covering provider during after hours [place], for this patient?  1. Check the care team in [facility] and look for a) attending/consulting TR[facility] provider listed and b) the [facility] team listed 2. Log into [email or web address] and use [place]'s universal password to access. If you do not have the password, please contact the hospital operator. 3. Locate the [facility] provider you are looking for under [facility] and page to a number that you can be directly reached. 4. If you still have difficulty reaching the provider, please page the [facility] (Director on Call) for the Hospitalists listed on amion for assistance.  03/25/2020, 8:14 AM

## 2020-03-25 NOTE — Progress Notes (Signed)
[facility])   Devon Peterson has ben referred for hospice services after discharge.   Liaison team will continue to follow to assist with admission onto hospice services and with discharge panning as needed.  Thank you for the opportunity to participate in this patient's care.  Venia Carbon RN, BSN, [facility] Liaison

## 2020-03-26 DIAGNOSIS — J9621 Acute and chronic respiratory failure with hypoxia: Secondary | ICD-10-CM | POA: Diagnosis not present

## 2020-03-26 LAB — GLUCOSE, CAPILLARY
Glucose-Capillary: 125 mg/dL — ABNORMAL HIGH (ref 70–99)
Glucose-Capillary: 120 mg/dL — ABNORMAL HIGH (ref 70–99)
Glucose-Capillary: 127 mg/dL — ABNORMAL HIGH (ref 70–99)
Glucose-Capillary: 116 mg/dL — ABNORMAL HIGH (ref 70–99)
Glucose-Capillary: 134 mg/dL — ABNORMAL HIGH (ref 70–99)

## 2020-03-26 NOTE — Progress Notes (Signed)
[facility] Progress Note  Patient: Devon Peterson    [id number]  DOA: 03/19/2020     Date of Service: the patient was seen and examined on 03/26/2020  Brief hospital course: COPD, cocaine abuse, tobacco abuse, laryngeal cancer SP radiation therapy, SP trach and PEG placement, squamous cell lung cancer on radiation therapy, A. fib, aspiration pneumonia.  Presents with complaints of shortness of breath possible aspiration pneumonia. ENT and PCCM consulted.  Underwent emergent tracheostomy tube placement on admission by ENT. Currently plan is arranging safe discharge plan and continue to engage with the patient regarding goals of care.  Assessment and Plan: 1.  Acute hypoxic respiratory failure. Stridor. Laryngeal cancer Aspiration pneumonia Radiation pneumonitis Presents with hypoxia. Underwent emergent tracheostomy tube placement by ENT. Declined tracheostomy last admission. Initially admitted with PCCM. Outpatient follow-up with ENT for tracheostomy.  Currently PCCM will not follow further for tracheostomy management. CT scan also shows evidence of aspiration pneumonia.  Treated with antibiotics. Speech therapy following for BMP. Outpatient follow-up with Dr. Wilburn Cornelia for ENT.  2.  Sepsis, POA secondary to lobar pneumonia Treated with antibiotics for 14 days.  3.  PEG tube malfunction Persistent clogging. IR was consulted SP repair. Currently functioning well.  Monitor.  4.  Paroxysmal A. fib, RVR Rate intermittently not controlled. Not on any anticoagulation. Continue current medication.  5.  COPD. Currently not active. Continue inhalers.  6.  Severe protein calorie malnutrition Placing the patient at high risk for poor outcome. On tube feeding diet. Monitor.  Body mass index is 16.36 kg/m.  Nutrition Problem: Severe Malnutrition Etiology: chronic illness,cancer and cancer related treatments Interventions: Interventions: Tube feeding,Prostat  7. goals of  care conversation. Extensive discussion with family and patient regarding goals of care. Currently full code. Patient was interested in home hospice but unable to go home due to poor home condition. Plan is to go to SNF, treat what is treatable and follow-up and establish care with palliative care.  Diet: N.p.o. DVT Prophylaxis:   enoxaparin (LOVENOX) injection 40 mg Start: 03/21/20 1600 Place and maintain sequential compression device Start: 03/19/20 0810    Advance goals of care discussion: Full code  Family Communication: no family was present at bedside, at the time of interview.   Disposition:  Status is: Inpatient  Remains inpatient appropriate because:Inpatient level of care appropriate due to severity of illness   Dispo: The patient is from: Home              Anticipated d/c is to: SNF              Anticipated d/c date is: 2 days              Patient currently is medically stable to d/c.   Difficult to place patient No        Subjective: No nausea no vomiting.  Continues to have some cough.  No chest pain.  No shortness of breath.  Tolerating tube feeding.  Physical Exam:  General: Appear in mild distress, no Rash; Oral Mucosa Clear, moist. no Abnormal Neck Mass Or lumps, Conjunctiva normal  Cardiovascular: S1 and S2 Present, no Murmur, Respiratory: increased respiratory effort, Bilateral Air entry present and bilateral faint Crackles, nol wheezes Abdomen: Bowel Sound present, Soft and no tenderness Extremities: no Pedal edema Neurology: alert and oriented to time, place, and person affect appropriate. no new focal deficit Gait not checked due to patient safety concerns  Vitals:   03/25/20 1611 03/25/20 2146 03/26/20 0427 03/26/20 1228  BP:  101/67 104/71 105/71  Pulse: (!) 102 100 98 99  Resp: 20 16 17 18   Temp:  98.6 F (37 C) 98.4 F (36.9 C) 99 F (37.2 C)  TempSrc:    Oral  SpO2: 96% 97% 98% 100%  Weight:      Height:        Intake/Output  Summary (Last 24 hours) at 03/26/2020 1413 Last data filed at 03/26/2020 1411 Gross per 24 hour  Intake 1440 ml  Output 2300 ml  Net -860 ml   Filed Weights   03/22/20 0500 03/23/20 0500 03/24/20 0459  Weight: 55.9 kg 53.7 kg 53.2 kg    Data Reviewed: I have personally reviewed and interpreted daily labs, tele strips, imaging. I reviewed all nursing notes, pharmacy notes, vitals, pertinent old records I have discussed plan of care as described above with RN and patient/family.  CBC: Recent Labs  Lab 03/22/20 0315 03/23/20 0325 03/24/20 0337  WBC 5.2 5.2 4.3  NEUTROABS 4.6 4.5 3.5  HGB 8.8* 9.1* 9.2*  HCT 27.3* 28.6* 28.5*  MCV 73.8* 73.7* 73.5*  PLT 390 359 433   Basic Metabolic Panel: Recent Labs  Lab 03/24/20 0337 03/25/20 0337  NA 132* 131*  K 4.2 4.3  CL 97* 95*  CO2 25 25  GLUCOSE 133* 123*  BUN 10 10  CREATININE 0.55* 0.44*  CALCIUM 8.4* 8.6*  MG 1.4* 1.7    Studies: No results found.  Scheduled Meds: . chlorhexidine  15 mL Mouth Rinse BID  . enoxaparin (LOVENOX) injection  40 mg Subcutaneous Q24H  . feeding supplement (PROSource TF)  45 mL Per Tube BID  . mouth rinse  15 mL Mouth Rinse q12n4p  . pantoprazole sodium  40 mg Per Tube q morning - 10a  . polyethylene glycol  17 g Per Tube Daily  . senna  2 tablet Per Tube Daily  . sertraline  50 mg Per Tube Daily  . thiamine  100 mg Per Tube Daily   Continuous Infusions: . sodium chloride    . feeding supplement (JEVITY 1.5 CAL/FIBER) 1,000 mL (03/26/20 1411)   PRN Meds: fentaNYL (SUBLIMAZE) injection, ipratropium-albuterol, LORazepam, oxyCODONE  Time spent: 35 minutes  Author: Berle Mull, MD [facility] 03/26/2020 2:13 PM  To reach On-call, see care teams to locate the attending and reach out via [email or web address]. Between 7PM-7AM, please contact night-coverage If you still have difficulty reaching the attending provider, please page the [facility] (Director on Call) for [facility] on  amion for assistance.

## 2020-03-26 NOTE — Plan of Care (Signed)
  Problem: Clinical Measurements: Goal: Diagnostic test results will improve Outcome: Progressing   Problem: Clinical Measurements: Goal: Respiratory complications will improve Outcome: Progressing   Problem: Activity: Goal: Risk for activity intolerance will decrease Outcome: Progressing   Problem: Nutrition: Goal: Adequate nutrition will be maintained Outcome: Progressing   Problem: Skin Integrity: Goal: Risk for impaired skin integrity will decrease Outcome: Progressing

## 2020-03-26 NOTE — Progress Notes (Signed)
RT unable to do trach check at 0400. RT called RN to make sure pt was doing okay. RN stated that he is doing fine.

## 2020-03-26 NOTE — Plan of Care (Signed)
Plan of care reviewed. 

## 2020-03-27 DIAGNOSIS — J9621 Acute and chronic respiratory failure with hypoxia: Secondary | ICD-10-CM | POA: Diagnosis not present

## 2020-03-27 LAB — COMPREHENSIVE METABOLIC PANEL
ALT: 14 U/L (ref 0–44)
AST: 15 U/L (ref 15–41)
Albumin: 2.7 g/dL — ABNORMAL LOW (ref 3.5–5.0)
Alkaline Phosphatase: 71 U/L (ref 38–126)
Anion gap: 11 (ref 5–15)
BUN: 13 mg/dL (ref 8–23)
CO2: 28 mmol/L (ref 22–32)
Calcium: 8.8 mg/dL — ABNORMAL LOW (ref 8.9–10.3)
Chloride: 93 mmol/L — ABNORMAL LOW (ref 98–111)
Creatinine, Ser: 0.46 mg/dL — ABNORMAL LOW (ref 0.61–1.24)
GFR, Estimated: >60 mL/min (ref >60)
Glucose, Bld: 119 mg/dL — ABNORMAL HIGH (ref 70–99)
Potassium: 4.5 mmol/L (ref 3.5–5.1)
Sodium: 132 mmol/L — ABNORMAL LOW (ref 135–145)
Total Bilirubin: 0.2 mg/dL — ABNORMAL LOW (ref 0.3–1.2)
Total Protein: 6.9 g/dL (ref 6.5–8.1)

## 2020-03-27 LAB — CBC WITH DIFFERENTIAL/PLATELET
Abs Immature Granulocytes: 0.02 10*3/uL (ref 0.00–0.07)
Basophils Absolute: 0 10*3/uL (ref 0.0–0.1)
Basophils Relative: 0 %
Eosinophils Absolute: 0 10*3/uL (ref 0.0–0.5)
Eosinophils Relative: 1 %
HCT: 28.7 % — ABNORMAL LOW (ref 39.0–52.0)
Hemoglobin: 9.3 g/dL — ABNORMAL LOW (ref 13.0–17.0)
Immature Granulocytes: 1 %
Lymphocytes Relative: 7 %
Lymphs Abs: 0.3 10*3/uL — ABNORMAL LOW (ref 0.7–4.0)
MCH: 24 pg — ABNORMAL LOW (ref 26.0–34.0)
MCHC: 32.4 g/dL (ref 30.0–36.0)
MCV: 74 fL — ABNORMAL LOW (ref 80.0–100.0)
Monocytes Absolute: 0.6 10*3/uL (ref 0.1–1.0)
Monocytes Relative: 15 %
Neutro Abs: 3.2 10*3/uL (ref 1.7–7.7)
Neutrophils Relative %: 76 %
Platelets: 305 10*3/uL (ref 150–400)
RBC: 3.88 MIL/uL — ABNORMAL LOW (ref 4.22–5.81)
RDW: 18.3 % — ABNORMAL HIGH (ref 11.5–15.5)
WBC: 4.2 10*3/uL (ref 4.0–10.5)
nRBC: 0 % (ref 0.0–0.2)

## 2020-03-27 LAB — GLUCOSE, CAPILLARY
Glucose-Capillary: 120 mg/dL — ABNORMAL HIGH (ref 70–99)
Glucose-Capillary: 120 mg/dL — ABNORMAL HIGH (ref 70–99)
Glucose-Capillary: 124 mg/dL — ABNORMAL HIGH (ref 70–99)
Glucose-Capillary: 128 mg/dL — ABNORMAL HIGH (ref 70–99)
Glucose-Capillary: 129 mg/dL — ABNORMAL HIGH (ref 70–99)
Glucose-Capillary: 133 mg/dL — ABNORMAL HIGH (ref 70–99)
Glucose-Capillary: 151 mg/dL — ABNORMAL HIGH (ref 70–99)

## 2020-03-27 NOTE — Plan of Care (Signed)
  Problem: Health Behavior/Discharge Planning: Goal: Ability to manage health-related needs will improve Outcome: Progressing   

## 2020-03-27 NOTE — Progress Notes (Signed)
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 fib, aspiration pneumonia.  Presents with complaints of shortness of breath possible aspiration pneumonia. ENT and PCCM consulted.  Underwent emergent tracheostomy tube placement on admission by ENT. Currently plan is arranging safe discharge plan and continue to engage with the patient regarding goals of care.  Assessment and Plan: 1.  Acute hypoxic respiratory failure. Stridor. Laryngeal cancer Aspiration pneumonia Radiation pneumonitis Presents with hypoxia. Underwent emergent tracheostomy tube placement by ENT. Declined tracheostomy last admission. Initially admitted with PCCM. Outpatient follow-up with ENT for tracheostomy.   PCCM will not follow further for tracheostomy management. CT scan also shows evidence of aspiration pneumonia. Treated with antibiotics. Speech therapy following for Passy-Muir valve. Outpatient follow-up with Dr. Wilburn Cornelia for ENT.  2.  Sepsis, POA secondary to lobar pneumonia Treated with antibiotics for 14 days.  3.  PEG tube malfunction Persistent clogging. IR was consulted SP repair. Currently functioning well.  Monitor.  4.  Paroxysmal A. fib, RVR Rate intermittently not controlled. Not on any anticoagulation. Continue current medication.  5.  COPD. Currently not active. Continue inhalers.  6.  Severe protein calorie malnutrition Placing the patient at high risk for poor outcome. On tube feeding diet. Monitor.  Body mass index is 16.42 kg/m.  Nutrition Problem: Severe Malnutrition Etiology: chronic illness,cancer and cancer related treatments Interventions: Interventions: Tube feeding,Prostat  7.  goals of care conversation. Extensive discussion with family and patient regarding goals of care. Currently full code. Patient was interested in home hospice but unable to go home due to poor home condition. Plan is to go to SNF, treat what is treatable and follow-up and establish care with palliative care.  Diet: N.p.o. DVT Prophylaxis:   enoxaparin (LOVENOX) injection 40 mg Start: 03/21/20 1600 Place and maintain sequential compression device Start: 03/19/20 0810    Advance goals of care discussion: Full code  Family Communication: no family was present at bedside, at the time of interview.   Disposition:  Status is: Inpatient  Remains inpatient appropriate because:Inpatient level of care appropriate due to severity of illness   Dispo: The patient is from: Home              Anticipated d/c is to: SNF              Anticipated d/c date is: 2 days              Patient currently is medically stable to d/c.   Difficult to place patient No  Subjective: No acute complaint.  No nausea no vomiting.  No acute events overnight.  Physical Exam:  General: Appear in mild distress, no Rash; Oral Mucosa Clear, moist. no Abnormal Neck Mass Or lumps, Conjunctiva normal  Cardiovascular: S1 and S2 Present, no Murmur, Respiratory: Normal respiratory effort, Bilateral Air entry present and bilateral upper airway crackles, no wheezes Abdomen: Bowel Sound present, Soft and no tenderness Extremities: trace Pedal edema Neurology: alert and oriented to place, and person affect appropriate. no new focal deficit Gait not checked due to patient safety concerns  Vitals:   03/26/20 2038 03/27/20 0406 03/27/20 0500 03/27/20 1313  BP: 105/79 98/67  100/73  Pulse: (!) 104 97  97  Resp: 18  16  19  Temp: 99.7 F (37.6 C) 98.6 F (37 C)    TempSrc: Oral Oral    SpO2: 100% 99%  100%  Weight:   53.4 kg   Height:        Intake/Output Summary (Last 24 hours) at 03/27/2020 1449 Last data filed at  03/27/2020 1402 Gross per 24 hour  Intake 2040 ml  Output 950 ml  Net 1090 ml   Filed Weights   03/23/20 0500 03/24/20 0459 03/27/20 0500  Weight: 53.7 kg 53.2 kg 53.4 kg    Data Reviewed: I have personally reviewed and interpreted daily labs, tele strips, imaging. I reviewed all nursing notes, pharmacy notes, vitals, pertinent old records I have discussed plan of care as described above with RN and patient/family.  CBC: Recent Labs  Lab 03/22/20 0315 03/23/20 0325 03/24/20 0337 03/27/20 0304  WBC 5.2 5.2 4.3 4.2  NEUTROABS 4.6 4.5 3.5 3.2  HGB 8.8* 9.1* 9.2* 9.3*  HCT 27.3* 28.6* 28.5* 28.7*  MCV 73.8* 73.7* 73.5* 74.0*  PLT 390 359 375 470   Basic Metabolic Panel: Recent Labs  Lab 03/24/20 0337 03/25/20 0337 03/27/20 0304  NA 132* 131* 132*  K 4.2 4.3 4.5  CL 97* 95* 93*  CO2 25 25 28   GLUCOSE 133* 123* 119*  BUN 10 10 13   CREATININE 0.55* 0.44* 0.46*  CALCIUM 8.4* 8.6* 8.8*  MG 1.4* 1.7  --     Studies: No results found.  Scheduled Meds:  chlorhexidine  15 mL Mouth Rinse BID   enoxaparin (LOVENOX) injection  40 mg Subcutaneous Q24H   feeding supplement (PROSource TF)  45 mL Per Tube BID   mouth rinse  15 mL Mouth Rinse q12n4p   pantoprazole sodium  40 mg Per Tube q morning - 10a   polyethylene glycol  17 g Per Tube Daily   senna  2 tablet Per Tube Daily   sertraline  50 mg Per Tube Daily   thiamine  100 mg Per Tube Daily   Continuous Infusions:  sodium chloride     feeding supplement (JEVITY 1.5 CAL/FIBER) 1,000 mL (03/27/20 0642)   PRN Meds: fentaNYL (SUBLIMAZE) injection, ipratropium-albuterol, LORazepam, oxyCODONE  Time spent: 35 minutes  Author: Berle Mull, MD [facility] 03/27/2020 2:49 PM  To reach On-call, see care teams to locate the attending and reach out via [email or web address]. Between 7PM-7AM, please contact night-coverage If you still have difficulty reaching the attending provider, please page the [facility] (Director on  Call) for [facility] on amion for assistance.

## 2020-03-28 DIAGNOSIS — J9621 Acute and chronic respiratory failure with hypoxia: Secondary | ICD-10-CM | POA: Diagnosis not present

## 2020-03-28 LAB — GLUCOSE, CAPILLARY
Glucose-Capillary: 107 mg/dL — ABNORMAL HIGH (ref 70–99)
Glucose-Capillary: 137 mg/dL — ABNORMAL HIGH (ref 70–99)
Glucose-Capillary: 123 mg/dL — ABNORMAL HIGH (ref 70–99)
Glucose-Capillary: 120 mg/dL — ABNORMAL HIGH (ref 70–99)
Glucose-Capillary: 126 mg/dL — ABNORMAL HIGH (ref 70–99)

## 2020-03-28 NOTE — Progress Notes (Signed)
PROGRESS NOTE  Devon Peterson  DOB: 08/01/1955  PCP: Patient, No Pcp Per [facility]:[id number]  DOA: 03/19/2020  LOS: 9 days   Chief Complaint  Patient presents with  . Respiratory Distress   Brief narrative: Artist Bloom is a 65 y.o. 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 fib, aspiration pneumonia.  Patient presented to ED on 1/15 with complaint of shortness of breath.  May 2021, diagnosed with laryngeal cancer, underwent tracheostomy. 07/23/2019 -08/21/2019 , patient underwent radiation to larynx.   08/26/2019, tracheostomy was decannulated. Also in May 2021, CT scan of chest showed 2.7 cm mass in the left lower lobe.  Bronchoscopy was nondiagnostic.   02/11/2020, CT-guided biopsy of LLL mass showed squamous cell carcinoma.  Patient was seen by radiation oncology. 03/10/2020-03/10/2012, patient was admitted for stridor and dyspnea.  He was seen by ENT and recommended tracheostomy which he refused and has since discharged to SNF. 03/19/2020, patient presented to the ED again with dyspnea, started on hypoxia.  Patient was seen by ENT and underwent tracheostomy placement.   Subjective: Patient was seen and examined this morning.  Elderly African-American male.  Lying down in bed.  Not in distress.  Can move his lips to answer questions appropriately.  Not in pain  Assessment/Plan: Acute hypoxic respiratory failure Laryngeal cancer -Timeline of events as above.  Underwent tracheostomy by ENT this admission. -Currently on 4 to 5 L oxygen through trach collar. -Outpatient follow-up with ENT Dr. Wilburn Cornelia for tracheostomy. -Speech therapy following for Passy-Muir valve. -Outpatient follow-up with Dr. Wilburn Cornelia for ENT.  Sepsis, POA  Aspiration pneumonia Radiation pneumonitis -Treated with antibiotics.  Currently not on antibiotics  Paroxysmal A. fib, RVR -Rate intermittently not  controlled. -Not on any anticoagulation. -Continue current medication.  COPD -Currently not active. -Continue inhalers.  Severe protein calorie malnutrition PEG tube status -Body mass index is 15.06 kg/m. -Nutrition consult appreciated.  Continue tube feeding.  Goals of care conversation. -Extensive discussion with family and patient regarding goals of care. -Currently full code. Patient was interested in home hospice but unable to go home due to poor home condition. Plan is to go to SNF, treat what is treatable and follow-up and establish care with palliative care.  But he is a new tracheostomy and is difficult to place  Mobility: Encourage ambulation. Code Status:   Code Status: Full Code  Nutritional status: Body mass index is 15.06 kg/m. Nutrition Problem: Severe Malnutrition Etiology: chronic illness,cancer and cancer related treatments Signs/Symptoms: severe fat depletion,severe muscle depletion Diet Order            Diet NPO time specified Except for: Ice Chips  Diet effective now                 DVT prophylaxis: enoxaparin (LOVENOX) injection 40 mg Start: 03/21/20 1600 Place and maintain sequential compression device Start: 03/19/20 0810   Antimicrobials:  None Fluid: None Consultants: ENT Family Communication:  None at bedside  Status is: Inpatient  Dispo: The patient is from: Home              Anticipated d/c is to: SNF              Anticipated d/c date is: Whenever bed/authorization is available              Patient currently is medically stable to d/c.   Difficult to place patient Yes  Infusions:  . sodium chloride    . feeding supplement (JEVITY 1.5 CAL/FIBER) 1,000 mL (03/28/20 0032)    Scheduled Meds: . chlorhexidine  15 mL Mouth Rinse BID  . enoxaparin (LOVENOX) injection  40 mg Subcutaneous Q24H  . feeding supplement (PROSource TF)  45 mL Per Tube BID  . mouth rinse  15 mL Mouth Rinse q12n4p  . pantoprazole sodium  40 mg Per  Tube q morning - 10a  . polyethylene glycol  17 g Per Tube Daily  . senna  2 tablet Per Tube Daily  . sertraline  50 mg Per Tube Daily  . thiamine  100 mg Per Tube Daily    Antimicrobials: Anti-infectives (From admission, onward)   Start     Dose/Rate Route Frequency Ordered Stop   03/19/20 1213  piperacillin-tazobactam (ZOSYN) 3.375 GM/50ML IVPB       Note to Pharmacy: Estell Harpin   : cabinet override      03/19/20 1213 03/20/20 0014   03/19/20 1000  piperacillin-tazobactam (ZOSYN) IVPB 3.375 g  Status:  Discontinued        3.375 g 12.5 mL/hr over 240 Minutes Intravenous Every 8 hours 03/19/20 0852 03/23/20 1300      PRN meds: fentaNYL (SUBLIMAZE) injection, ipratropium-albuterol, LORazepam, oxyCODONE   Objective: Vitals:   03/28/20 0911 03/28/20 1211  BP:    Pulse: 99 (!) 101  Resp: 18 16  Temp:    SpO2: 98% 99%    Intake/Output Summary (Last 24 hours) at 03/28/2020 1307 Last data filed at 03/28/2020 0900 Gross per 24 hour  Intake 1840 ml  Output 1850 ml  Net -10 ml   Filed Weights   03/24/20 0459 03/27/20 0500 03/28/20 0500  Weight: 53.2 kg 53.4 kg 49 kg   Weight change: -4.412 kg Body mass index is 15.06 kg/m.   Physical Exam: General exam: Pleasant elderly African-American male.  Not in distress Skin: No rashes, lesions or ulcers. HEENT: Tracheostomy tube anteriorly Lungs: Clear to auscultation bilaterally CVS: Regular rate and rhythm, no murmur GI/Abd soft, nontender, nondistended, bowel sound present.  PEG tube site intact CNS: Alert, awake, moves lips to answer questions appropriately.  Oriented x3 Psychiatry: Depressed look Extremities: No pedal edema, no calf tenderness  Data Review: I have personally reviewed the laboratory data and studies available.  Recent Labs  Lab 03/22/20 0315 03/23/20 0325 03/24/20 0337 03/27/20 0304  WBC 5.2 5.2 4.3 4.2  NEUTROABS 4.6 4.5 3.5 3.2  HGB 8.8* 9.1* 9.2* 9.3*  HCT 27.3* 28.6* 28.5* 28.7*  MCV 73.8*  73.7* 73.5* 74.0*  PLT 390 359 375 305   Recent Labs  Lab 03/24/20 0337 03/25/20 0337 03/27/20 0304  NA 132* 131* 132*  K 4.2 4.3 4.5  CL 97* 95* 93*  CO2 25 25 28   GLUCOSE 133* 123* 119*  BUN 10 10 13   CREATININE 0.55* 0.44* 0.46*  CALCIUM 8.4* 8.6* 8.8*  MG 1.4* 1.7  --     F/u labs ordered  Signed, Terrilee Croak, MD [facility] 03/28/2020

## 2020-03-28 NOTE — Progress Notes (Signed)
Requested Unit Sec order trach care kits for PT.

## 2020-03-28 NOTE — Progress Notes (Signed)
PT denies need for trach suctioning at this time.

## 2020-03-28 NOTE — Progress Notes (Signed)
PT completing ADL's with RN Tech- does not appear to be in respiratory distress at this time.

## 2020-03-28 NOTE — Progress Notes (Signed)
Manufacturing engineer [facility])   This patient has been referred for hospice services and has been deemed eligible by [facility] MD.  [facility] will continue to follow for any discharge planning needs and to coordinate admission onto hospice care.    Thank you for the opportunity to participate in this patient's care.     Domenic Moras, BSN, RN [facility] Liaison   [phone] (24h on call)

## 2020-03-29 DIAGNOSIS — J9621 Acute and chronic respiratory failure with hypoxia: Secondary | ICD-10-CM | POA: Diagnosis not present

## 2020-03-29 LAB — CBC WITH DIFFERENTIAL/PLATELET
Abs Immature Granulocytes: 0.02 10*3/uL (ref 0.00–0.07)
Basophils Absolute: 0 10*3/uL (ref 0.0–0.1)
Basophils Relative: 1 %
Eosinophils Absolute: 0 10*3/uL (ref 0.0–0.5)
Eosinophils Relative: 1 %
HCT: 29.1 % — ABNORMAL LOW (ref 39.0–52.0)
Hemoglobin: 9 g/dL — ABNORMAL LOW (ref 13.0–17.0)
Immature Granulocytes: 1 %
Lymphocytes Relative: 11 %
Lymphs Abs: 0.5 10*3/uL — ABNORMAL LOW (ref 0.7–4.0)
MCH: 23.1 pg — ABNORMAL LOW (ref 26.0–34.0)
MCHC: 30.9 g/dL (ref 30.0–36.0)
MCV: 74.8 fL — ABNORMAL LOW (ref 80.0–100.0)
Monocytes Absolute: 0.7 10*3/uL (ref 0.1–1.0)
Monocytes Relative: 17 %
Neutro Abs: 2.9 10*3/uL (ref 1.7–7.7)
Neutrophils Relative %: 69 %
Platelets: 280 10*3/uL (ref 150–400)
RBC: 3.89 MIL/uL — ABNORMAL LOW (ref 4.22–5.81)
RDW: 18.9 % — ABNORMAL HIGH (ref 11.5–15.5)
WBC: 4.1 10*3/uL (ref 4.0–10.5)
nRBC: 0 % (ref 0.0–0.2)

## 2020-03-29 LAB — BASIC METABOLIC PANEL
Anion gap: 13 (ref 5–15)
BUN: 16 mg/dL (ref 8–23)
CO2: 26 mmol/L (ref 22–32)
Calcium: 9.1 mg/dL (ref 8.9–10.3)
Chloride: 95 mmol/L — ABNORMAL LOW (ref 98–111)
Creatinine, Ser: 0.45 mg/dL — ABNORMAL LOW (ref 0.61–1.24)
GFR, Estimated: >60 mL/min (ref >60)
Glucose, Bld: 140 mg/dL — ABNORMAL HIGH (ref 70–99)
Potassium: 4.2 mmol/L (ref 3.5–5.1)
Sodium: 134 mmol/L — ABNORMAL LOW (ref 135–145)

## 2020-03-29 LAB — GLUCOSE, CAPILLARY
Glucose-Capillary: 116 mg/dL — ABNORMAL HIGH (ref 70–99)
Glucose-Capillary: 124 mg/dL — ABNORMAL HIGH (ref 70–99)
Glucose-Capillary: 133 mg/dL — ABNORMAL HIGH (ref 70–99)
Glucose-Capillary: 134 mg/dL — ABNORMAL HIGH (ref 70–99)
Glucose-Capillary: 134 mg/dL — ABNORMAL HIGH (ref 70–99)
Glucose-Capillary: 138 mg/dL — ABNORMAL HIGH (ref 70–99)

## 2020-03-29 LAB — MAGNESIUM: Magnesium: 1.6 mg/dL — ABNORMAL LOW (ref 1.7–2.4)

## 2020-03-29 LAB — PHOSPHORUS: Phosphorus: 2.9 mg/dL (ref 2.5–4.6)

## 2020-03-29 MED ORDER — OXYCODONE HCL 5 MG/5ML PO SOLN
5.0000 mg | Freq: Four times a day (QID) | ORAL | Status: DC | PRN
  Administered 2020-03-30 – 2020-03-31 (×3): 5 mg
  Filled 2020-03-29 (×3): qty 5

## 2020-03-29 MED ORDER — MAGNESIUM SULFATE 4 GM/100ML IV SOLN
4.0000 g | Freq: Once | INTRAVENOUS | Status: AC
  Administered 2020-03-29: 4 g via INTRAVENOUS
  Filled 2020-03-29: qty 100

## 2020-03-29 NOTE — Progress Notes (Signed)
PT denies need for trach suctioning at this time.

## 2020-03-29 NOTE — Progress Notes (Signed)
Nutrition Follow-up  DOCUMENTATION CODES:   Underweight,Severe malnutrition in context of chronic illness  INTERVENTION:   -Jevity 1.5@ 1ml/hr via PEG -28ml Prosource TF BID  Tube feeding regimen provides2240kcal (100% of needs),114grams of protein, and 1030ml of H2O.   NUTRITION DIAGNOSIS:   Severe Malnutrition related to chronic illness,cancer and cancer related treatments as evidenced by severe fat depletion,severe muscle depletion.  Ongoing.  GOAL:   Patient will meet greater than or equal to 90% of their needs  Meeting with TF  MONITOR:   Labs,Weight trends,I & O's,TF tolerance  ASSESSMENT:   64 yo male former smoker with laryngeal cancer (poorly differentiated squamous cell) presented to ER with dyspnea, stridor and hypoxia.  1/15- s/pTracheostomyand direct laryngoscopy with biopsy 1/16- NGT placed and placement verified by x-ray 1/17- NGT removed  Patient continues to receive Jevity 1.5 @ 60 ml/hr via PEG with 45 ml Prosource TF BID. Tolerating.   Admission weight: 122 lbs. Current weight: 123 lbs.  Medications: Senokot, Thiamine  Labs reviewed: CBGs: 116-133 Low Na, Mg  Diet Order:   Diet Order            Diet NPO time specified Except for: Ice Chips  Diet effective now                 EDUCATION NEEDS:   No education needs have been identified at this time  Skin:  Skin Assessment: Skin Integrity Issues: Skin Integrity Issues:: Incisions Incisions: closed neck s/p trach  Last BM:  1/25 -type 7  Height:   Ht Readings from Last 1 Encounters:  03/19/20 5\' 11"  (1.803 m)    Weight:   Wt Readings from Last 1 Encounters:  03/29/20 49 kg    BMI:  Body mass index is 15.06 kg/m.  Estimated Nutritional Needs:   Kcal:  2000-2200  Protein:  110-125 grams  Fluid:  > 2 L   Clayton Bibles, MS, RD, LDN Inpatient Clinical Dietitian Contact information available via Amion

## 2020-03-29 NOTE — Progress Notes (Signed)
PROGRESS NOTE  Devon Peterson  DOB: 09-Aug-1955  PCP: Patient, No Pcp Per [id number]  DOA: 03/19/2020  LOS: 10 days   Chief Complaint  Patient presents with  . Respiratory Distress   Brief narrative: Devon Peterson is a 65 y.o. 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 fib, aspiration pneumonia.  Patient presented to ED on 1/15 with complaint of shortness of breath.  May 2021, diagnosed with laryngeal cancer, underwent tracheostomy. 07/23/2019 -08/21/2019 , patient underwent radiation to larynx.   08/26/2019, tracheostomy was decannulated. Also in May 2021, CT scan of chest showed 2.7 cm mass in the left lower lobe.  Bronchoscopy was nondiagnostic.   02/11/2020, CT-guided biopsy of LLL mass showed squamous cell carcinoma.  Patient was seen by radiation oncology. 03/10/2020-03/10/2012, patient was admitted for stridor and dyspnea.  He was seen by ENT and recommended tracheostomy which he refused and has since discharged to SNF. 03/19/2020, patient presented to the ED again with dyspnea, started on hypoxia.  Patient was seen by ENT and underwent tracheostomy placement.  Subjective: Patient was seen and examined this morning. Lying down in bed.  Not in distress.  Has 5 L oxygen per minute flowing through tracheostomy.  PEG tube feeding ongoing.  No distress.  Assessment/Plan: Acute hypoxic respiratory failure Laryngeal cancer -Timeline of events as above. Underwent tracheostomy by ENT this admission. -Currently on 4 to 5 L oxygen through trach collar. -Outpatient follow-up with ENT Dr. Wilburn Cornelia for tracheostomy. -Speech therapy following for Passy-Muir valve. -Outpatient follow-up with Dr. Wilburn Cornelia for ENT. -Continue intermittent suctioning.  Sepsis, POA  Aspiration pneumonia Radiation pneumonitis -Treated with antibiotics.  Currently not on antibiotics  Paroxysmal A.  fib, RVR -Rate intermittently not controlled. -Not on any anticoagulation. -Continue current medication.  Hypomagnesemia -Magnesium level low at 1.6 this morning.  Replacement ordered.  Recheck. Recent Labs  Lab 03/24/20 0337 03/25/20 0337 03/27/20 0304 03/29/20 0328  K 4.2 4.3 4.5 4.2  MG 1.4* 1.7  --  1.6*  PHOS  --   --   --  2.9   COPD -Currently not active. -Continue inhalers.  Severe protein calorie malnutrition PEG tube status -Body mass index is 15.06 kg/m. -Nutrition consult appreciated.  Continue tube feeding.  Goals of care conversation. -Extensive discussion with family and patient regarding goals of care. -Currently full code. Patient was interested in home hospice but unable to go home due to poor home condition. Plan is to go to SNF, treat what is treatable and follow-up and establish care with palliative care.  But he is a new tracheostomy and is difficult to place  Mobility: Encourage ambulation. Code Status:   Code Status: Full Code  Nutritional status: Body mass index is 15.06 kg/m. Nutrition Problem: Severe Malnutrition Etiology: chronic illness,cancer and cancer related treatments Signs/Symptoms: severe fat depletion,severe muscle depletion Diet Order            Diet NPO time specified Except for: Ice Chips  Diet effective now                 DVT prophylaxis: enoxaparin (LOVENOX) injection 40 mg Start: 03/21/20 1600 Place and maintain sequential compression device Start: 03/19/20 0810   Antimicrobials:  None Fluid: None Consultants: ENT Family Communication:  None at bedside  Status is: Inpatient  Dispo: The patient is from: Home              Anticipated d/c is  to: SNF              Anticipated d/c date is: Whenever bed/authorization is available              Patient currently is medically stable to d/c.   Difficult to place patient Yes   Infusions:  . sodium chloride    . feeding supplement (JEVITY 1.5 CAL/FIBER) 1,000 mL  (03/28/20 1809)    Scheduled Meds: . chlorhexidine  15 mL Mouth Rinse BID  . enoxaparin (LOVENOX) injection  40 mg Subcutaneous Q24H  . feeding supplement (PROSource TF)  45 mL Per Tube BID  . mouth rinse  15 mL Mouth Rinse q12n4p  . pantoprazole sodium  40 mg Per Tube q morning - 10a  . polyethylene glycol  17 g Per Tube Daily  . senna  2 tablet Per Tube Daily  . sertraline  50 mg Per Tube Daily  . thiamine  100 mg Per Tube Daily    Antimicrobials: Anti-infectives (From admission, onward)   Start     Dose/Rate Route Frequency Ordered Stop   03/19/20 1213  piperacillin-tazobactam (ZOSYN) 3.375 GM/50ML IVPB       Note to Pharmacy: Estell Harpin   : cabinet override      03/19/20 1213 03/20/20 0014   03/19/20 1000  piperacillin-tazobactam (ZOSYN) IVPB 3.375 g  Status:  Discontinued        3.375 g 12.5 mL/hr over 240 Minutes Intravenous Every 8 hours 03/19/20 0852 03/23/20 1300      PRN meds: fentaNYL (SUBLIMAZE) injection, ipratropium-albuterol, LORazepam, oxyCODONE   Objective: Vitals:   03/29/20 0921 03/29/20 1223  BP:    Pulse: 93 95  Resp: 17 17  Temp:    SpO2: 100% 98%    Intake/Output Summary (Last 24 hours) at 03/29/2020 1343 Last data filed at 03/29/2020 1100 Gross per 24 hour  Intake 1350 ml  Output 1400 ml  Net -50 ml   Filed Weights   03/27/20 0500 03/28/20 0500 03/29/20 0442  Weight: 53.4 kg 49 kg 49 kg   Weight change: 0 kg Body mass index is 15.06 kg/m.   Physical Exam: General exam: Pleasant elderly African-American male.  Not in distress Skin: No rashes, lesions or ulcers. HEENT: Tracheostomy tube anteriorly Lungs: Clear to auscultation bilaterally. CVS: Regular rate and rhythm, no murmur GI/Abd soft, nontender, nondistended, bowel sound present.  PEG tube site intact CNS: Alert, awake, moves lips to answer questions appropriately.  Oriented x3 Psychiatry: Depressed look Extremities: No pedal edema, no calf tenderness  Data Review: I have  personally reviewed the laboratory data and studies available.  Recent Labs  Lab 03/23/20 0325 03/24/20 0337 03/27/20 0304 03/29/20 0328  WBC 5.2 4.3 4.2 4.1  NEUTROABS 4.5 3.5 3.2 2.9  HGB 9.1* 9.2* 9.3* 9.0*  HCT 28.6* 28.5* 28.7* 29.1*  MCV 73.7* 73.5* 74.0* 74.8*  PLT 359 375 305 280   Recent Labs  Lab 03/24/20 0337 03/25/20 0337 03/27/20 0304 03/29/20 0328  NA 132* 131* 132* 134*  K 4.2 4.3 4.5 4.2  CL 97* 95* 93* 95*  CO2 25 25 28 26   GLUCOSE 133* 123* 119* 140*  BUN 10 10 13 16   CREATININE 0.55* 0.44* 0.46* 0.45*  CALCIUM 8.4* 8.6* 8.8* 9.1  MG 1.4* 1.7  --  1.6*  PHOS  --   --   --  2.9    F/u labs ordered  Signed, Terrilee Croak, MD [facility] 03/29/2020

## 2020-03-29 NOTE — Evaluation (Signed)
Physical Therapy Evaluation Patient Details Name: Devon Peterson MRN: [id number] DOB: [birth year] Today's Date: 03/29/2020   History of Present Illness  65 y.o. 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 fib, aspiration pneumonia.  Patient presented to ED on 1/15 with complaint of shortness of breath. Dx of respiratory failure.  Recent admission 1/6-1/14/22 for sepsis 2* PNA.  Clinical Impression  Pt admitted with above diagnosis. Min guard assist for supine to sit, pt sat at edge of bed for ~5 minutes and performed BUE/LE exercises. Min guard assist for sit to stand, pt stood for 60 seconds + 20 seconds, HR 129 max, SaO2 85% on 5L TC while standing.  Pain medication requested as pt nodded yes and pointed to his trach when asked if in pain. Pt declined transfer to recliner, activity tolerance limited by fatigue and 3/4 dyspnea. Pt currently with functional limitations due to the deficits listed below (see PT Problem List). Pt will benefit from skilled PT to increase their independence and safety with mobility to allow discharge to the venue listed below.       Follow Up Recommendations SNF    Equipment Recommendations  None recommended by PT    Recommendations for Other Services       Precautions / Restrictions Precautions Precautions: Fall Precaution Comments: currently on 5L O2 Trach collar, PEG Restrictions Weight Bearing Restrictions: No      Mobility  Bed Mobility Overal bed mobility: Needs Assistance Bed Mobility: Supine to Sit     Supine to sit: HOB elevated;Min guard     General bed mobility comments: used rail, assist to manage multiple lines; pt sat EOB x 5 minutes and performed BUE/LE exercises    Transfers Overall transfer level: Needs assistance Equipment used: 1 person hand held assist Transfers: Sit to/from Stand Sit to Stand: Min guard          General transfer comment: pt stood at edge of bed for 2 trials of 60 seconds + 20 seconds with min/guard assist. Pt kept R knee flexed, and avoided weightbearing onto RLE, he denied pain in RLE but nodded yes when asked if RLE was weak. HR 129 max with standing, SaO2 85% on 5L O2 TC with standing. Pt declined SPT to recliner.  Ambulation/Gait                Stairs            Wheelchair Mobility    Modified Rankin (Stroke Patients Only)       Balance Overall balance assessment: Needs assistance         Standing balance support: Bilateral upper extremity supported Standing balance-Leahy Scale: Poor Standing balance comment: reliant on UE and external support                             Pertinent Vitals/Pain Pain Assessment: Faces Faces Pain Scale: Hurts even more Pain Location: tracheostomy location Pain Descriptors / Indicators: Grimacing;Guarding Pain Intervention(s): Limited activity within patient's tolerance;Monitored during session;Patient requesting pain meds-RN notified    Home Living Family/patient expects to be discharged to:: Skilled nursing facility                 Additional Comments: plans SNF    Prior Function Level of Independence: Needs assistance   Gait / Transfers Assistance Needed: pt mouthed "a little" when asked if he  was walking at SNF prior to admission           Hand Dominance        Extremity/Trunk Assessment   Upper Extremity Assessment Upper Extremity Assessment: Generalized weakness    Lower Extremity Assessment Lower Extremity Assessment: Generalized weakness (B knee ext 4/5)    Cervical / Trunk Assessment Cervical / Trunk Assessment: Kyphotic  Communication   Communication: Tracheostomy;Expressive difficulties (pt nods head for yes/no, mouths words when asked questions)  Cognition Arousal/Alertness: Awake/alert Behavior During Therapy: Flat affect Overall Cognitive Status: Within Functional  Limits for tasks assessed                                        General Comments      Exercises General Exercises - Upper Extremity Shoulder Flexion: AROM;Both;10 reps;Seated General Exercises - Lower Extremity Ankle Circles/Pumps: AROM;Both;15 reps;Seated Long Arc Quad: AROM;Both;10 reps;Seated Hip Flexion/Marching: AROM;Both;10 reps;Seated   Assessment/Plan    PT Assessment Patient needs continued PT services  PT Problem List Decreased strength;Decreased activity tolerance;Decreased knowledge of use of DME;Decreased mobility;Decreased balance;Cardiopulmonary status limiting activity       PT Treatment Interventions Gait training;DME instruction;Therapeutic exercise;Functional mobility training;Therapeutic activities;Patient/family education;Balance training    PT Goals (Current goals can be found in the Care Plan section)  Acute Rehab PT Goals Patient Stated Goal: none stated PT Goal Formulation: Patient unable to participate in goal setting Time For Goal Achievement: 04/12/20 Potential to Achieve Goals: Fair    Frequency Min 2X/week   Barriers to discharge        Co-evaluation               AM-PAC PT "6 Clicks" Mobility  Outcome Measure Help needed turning from your back to your side while in a flat bed without using bedrails?: A Little Help needed moving from lying on your back to sitting on the side of a flat bed without using bedrails?: A Little Help needed moving to and from a bed to a chair (including a wheelchair)?: A Lot Help needed standing up from a chair using your arms (e.g., wheelchair or bedside chair)?: A Lot Help needed to walk in hospital room?: A Lot Help needed climbing 3-5 steps with a railing? : Total 6 Click Score: 13    End of Session Equipment Utilized During Treatment: Oxygen Activity Tolerance: Patient limited by fatigue Patient left: in chair;with call bell/phone within reach Nurse Communication: Mobility status  (Nurse tech aware pt in recliner, no chair alarm, pt aware to use call bell for safety) PT Visit Diagnosis: Other abnormalities of gait and mobility (R26.89)    Time: 4696-2952 PT Time Calculation (min) (ACUTE ONLY): 18 min   Charges:   PT Evaluation $PT Eval Low Complexity: 1 Low        Philomena Doheny PT 03/29/2020  Acute Rehabilitation Services Pager [phone] Office [phone]

## 2020-03-29 NOTE — Progress Notes (Signed)
Manufacturing engineer [facility])   This patient has been referred for hospice services and has been deemed eligible by [facility] MD. [facility] will continue to follow for any discharge planning needs and to coordinate admission onto hospice care.  Thank you for the opportunity to participate in this patient's care.  Clementeen Hoof, BSN, RN [facility] [phone] on call)

## 2020-03-29 NOTE — TOC Progression Note (Signed)
Transition of Care [facility]) - Progression Note    Patient Details  Name: Devon Peterson MRN: [id number] Date of Birth: 1955/11/15  Transition of Care [facility]) CM/SW Contact  Lennart Pall, LCSW Phone Number: 03/29/2020, 3:53 PM  Clinical Narrative:    Continue to work on SNF placement. Faxed pt's information to facilities that were recommended by administration as possibly being willing to consider new trach patient.   [place] in [facility] None are able to accept pt due to his trach being < 30 days.   Expected Discharge Plan: [place] Barriers to Discharge: Continued Medical Work up,Other (comment) (Pt with new trach (placed 03/19/20)  Expected Discharge Plan and Services Expected Discharge Plan: [place] In-house Referral: Clinical Social Work     Living arrangements for the past 2 months: [place] ([place] since 08/2019) Expected Discharge Date:  (unknown)               DME Arranged: N/A DME Agency: NA                   Social Determinants of Health ([employer]) Interventions    Readmission Risk Interventions Readmission Risk Prevention Plan 07/27/2019  Transportation Screening Complete  HRI or [place] Complete  Social Work Consult for [place] Planning/Counseling Complete  Palliative Care Screening Complete  Medication Review Press photographer) Complete  Some recent data might be hidden

## 2020-03-30 LAB — GLUCOSE, CAPILLARY
Glucose-Capillary: 113 mg/dL — ABNORMAL HIGH (ref 70–99)
Glucose-Capillary: 118 mg/dL — ABNORMAL HIGH (ref 70–99)
Glucose-Capillary: 122 mg/dL — ABNORMAL HIGH (ref 70–99)
Glucose-Capillary: 130 mg/dL — ABNORMAL HIGH (ref 70–99)
Glucose-Capillary: 130 mg/dL — ABNORMAL HIGH (ref 70–99)
Glucose-Capillary: 131 mg/dL — ABNORMAL HIGH (ref 70–99)
Glucose-Capillary: 132 mg/dL — ABNORMAL HIGH (ref 70–99)

## 2020-03-30 NOTE — Progress Notes (Signed)
PROGRESS NOTE  Devon Peterson  DOB: Aug 08, 1955  PCP: Patient, No Pcp Per HCW:[id number]  DOA: 03/19/2020  LOS: 11 days   Chief Complaint  Patient presents with  . Respiratory Distress   Brief narrative: Devon Peterson is a 66 y.o. 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 fib, aspiration pneumonia.  Patient presented to ED on 1/15 with complaint of shortness of breath.  May 2021, diagnosed with laryngeal cancer, underwent tracheostomy. 07/23/2019 -08/21/2019 , patient underwent radiation to larynx.   08/26/2019, tracheostomy was decannulated. Also in May 2021, CT scan of chest showed 2.7 cm mass in the left lower lobe.  Bronchoscopy was nondiagnostic.   02/11/2020, CT-guided biopsy of LLL mass showed squamous cell carcinoma.  Patient was seen by radiation oncology. 03/10/2020-03/10/2012, patient was admitted for stridor and dyspnea.  He was seen by ENT and recommended tracheostomy which he refused and has since discharged to SNF. 03/19/2020, patient presented to the ED again with dyspnea, started on hypoxia.  Patient was seen by ENT and underwent tracheostomy placement.  Subjective: Patient was seen and examined this morning. Lying down in bed.  Not in distress.  Oxygen through tracheostomy tube.  PEG tube feeding ongoing. Clinically stable.  Assessment/Plan: Acute hypoxic respiratory failure Laryngeal cancer -Timeline of events as above. Underwent tracheostomy by ENT this admission. -Currently on 4 to 5 L oxygen through trach collar. -Outpatient follow-up with ENT Dr. Wilburn Cornelia for tracheostomy. -Speech therapy following for Passy-Muir valve. -Outpatient follow-up with Dr. Wilburn Cornelia for ENT. -Continue intermittent suctioning.  Sepsis, POA  Aspiration pneumonia Radiation pneumonitis -Treated with antibiotics.  Currently not on antibiotics  Paroxysmal A. fib, RVR -Rate  intermittently not controlled. -Not on any anticoagulation. -Continue current medication.  Hypomagnesemia -Magnesium level was low at 1.6 yesterday.  Was replaced.  We will recheck labs intermittently in next few days.. Recent Labs  Lab 03/24/20 0337 03/25/20 0337 03/27/20 0304 03/29/20 0328  K 4.2 4.3 4.5 4.2  MG 1.4* 1.7  --  1.6*  PHOS  --   --   --  2.9   COPD -Currently not active. -Continue inhalers.  Severe protein calorie malnutrition PEG tube status -Body mass index is 7.53 kg/m. -Nutrition consult appreciated.  Continue tube feeding.  Goals of care conversation. -Extensive discussion with family and patient regarding goals of care. -Currently full code. Patient was interested in home hospice but unable to go home due to poor home condition. Plan is to go to SNF, treat what is treatable and follow-up and establish care with palliative care.  But he is a new tracheostomy and is difficult to place  Mobility: Encourage ambulation. Code Status:   Code Status: Full Code  Nutritional status: Body mass index is 7.53 kg/m. Nutrition Problem: Severe Malnutrition Etiology: chronic illness,cancer and cancer related treatments Signs/Symptoms: severe fat depletion,severe muscle depletion Diet Order            Diet NPO time specified Except for: Ice Chips  Diet effective now                 DVT prophylaxis: enoxaparin (LOVENOX) injection 40 mg Start: 03/21/20 1600 Place and maintain sequential compression device Start: 03/19/20 0810   Antimicrobials:  None Fluid: None Consultants: ENT Family Communication:  None at bedside  Status is: Inpatient  Dispo: The patient is from: Home              Anticipated  d/c is to: SNF              Anticipated d/c date is: Whenever bed/authorization is available              Patient currently is medically stable to d/c.   Difficult to place patient Yes   Infusions:  . sodium chloride    . feeding supplement (JEVITY 1.5  CAL/FIBER) 1,000 mL (03/30/20 4917)    Scheduled Meds: . chlorhexidine  15 mL Mouth Rinse BID  . enoxaparin (LOVENOX) injection  40 mg Subcutaneous Q24H  . feeding supplement (PROSource TF)  45 mL Per Tube BID  . mouth rinse  15 mL Mouth Rinse q12n4p  . pantoprazole sodium  40 mg Per Tube q morning - 10a  . polyethylene glycol  17 g Per Tube Daily  . senna  2 tablet Per Tube Daily  . sertraline  50 mg Per Tube Daily  . thiamine  100 mg Per Tube Daily    Antimicrobials: Anti-infectives (From admission, onward)   Start     Dose/Rate Route Frequency Ordered Stop   03/19/20 1213  piperacillin-tazobactam (ZOSYN) 3.375 GM/50ML IVPB       Note to Pharmacy: Estell Harpin   : cabinet override      03/19/20 1213 03/20/20 0014   03/19/20 1000  piperacillin-tazobactam (ZOSYN) IVPB 3.375 g  Status:  Discontinued        3.375 g 12.5 mL/hr over 240 Minutes Intravenous Every 8 hours 03/19/20 0852 03/23/20 1300      PRN meds: fentaNYL (SUBLIMAZE) injection, ipratropium-albuterol, LORazepam, oxyCODONE   Objective: Vitals:   03/29/20 2106 03/30/20 0639  BP: 104/72 107/77  Pulse: 100 89  Resp:  16  Temp: 98.7 F (37.1 C) 99 F (37.2 C)  SpO2: 100% 100%    Intake/Output Summary (Last 24 hours) at 03/30/2020 1337 Last data filed at 03/30/2020 1300 Gross per 24 hour  Intake 700 ml  Output 2400 ml  Net -1700 ml   Filed Weights   03/28/20 0500 03/29/20 0442 03/30/20 0414  Weight: 49 kg 49 kg 24.5 kg   Weight change: -24.5 kg Body mass index is 7.53 kg/m.   Physical Exam: General exam: Pleasant elderly African-American male.  Not in distress Skin: No rashes, lesions or ulcers. HEENT: Tracheostomy tube anteriorly. Lungs: Clear to auscultation bilaterally CVS: Regular rate and rhythm, no murmur GI/Abd soft, nontender, nondistended, bowel sound present.  PEG tube site intact, getting tube feeding. CNS: Alert, awake, moves lips to answer questions appropriately.  Oriented  x3 Psychiatry: Depressed look Extremities: No pedal edema, no calf tenderness  Data Review: I have personally reviewed the laboratory data and studies available.  Recent Labs  Lab 03/24/20 0337 03/27/20 0304 03/29/20 0328  WBC 4.3 4.2 4.1  NEUTROABS 3.5 3.2 2.9  HGB 9.2* 9.3* 9.0*  HCT 28.5* 28.7* 29.1*  MCV 73.5* 74.0* 74.8*  PLT 375 305 280   Recent Labs  Lab 03/24/20 0337 03/25/20 0337 03/27/20 0304 03/29/20 0328  NA 132* 131* 132* 134*  K 4.2 4.3 4.5 4.2  CL 97* 95* 93* 95*  CO2 25 25 28 26   GLUCOSE 133* 123* 119* 140*  BUN 10 10 13 16   CREATININE 0.55* 0.44* 0.46* 0.45*  CALCIUM 8.4* 8.6* 8.8* 9.1  MG 1.4* 1.7  --  1.6*  PHOS  --   --   --  2.9    F/u labs ordered  Signed, Terrilee Croak, MD [facility] 03/30/2020

## 2020-03-31 LAB — GLUCOSE, CAPILLARY
Glucose-Capillary: 138 mg/dL — ABNORMAL HIGH (ref 70–99)
Glucose-Capillary: 121 mg/dL — ABNORMAL HIGH (ref 70–99)
Glucose-Capillary: 100 mg/dL — ABNORMAL HIGH (ref 70–99)
Glucose-Capillary: 121 mg/dL — ABNORMAL HIGH (ref 70–99)
Glucose-Capillary: 95 mg/dL (ref 70–99)

## 2020-03-31 MED ORDER — OXYCODONE HCL 5 MG/5ML PO SOLN
5.0000 mg | ORAL | Status: DC | PRN
  Administered 2020-03-31 – 2020-04-21 (×70): 5 mg
  Filled 2020-03-31 (×72): qty 5

## 2020-03-31 MED ORDER — HYDROMORPHONE HCL 1 MG/ML IJ SOLN
1.0000 mg | INTRAMUSCULAR | Status: DC | PRN
  Administered 2020-03-31 – 2020-04-03 (×5): 1 mg via INTRAVENOUS
  Filled 2020-03-31 (×5): qty 1

## 2020-03-31 MED ORDER — FENTANYL 12 MCG/HR TD PT72
1.0000 | MEDICATED_PATCH | TRANSDERMAL | Status: DC
  Administered 2020-03-31 – 2020-04-21 (×9): 1 via TRANSDERMAL
  Filled 2020-03-31 (×10): qty 1

## 2020-03-31 NOTE — Progress Notes (Signed)
PROGRESS NOTE  Devon Peterson  DOB: 1955-03-27  PCP: Patient, No Pcp Per HCW:[id number]  DOA: 03/19/2020  LOS: 12 days   Chief Complaint  Patient presents with  . Respiratory Distress   Brief narrative: Devon Peterson is a 65 y.o. 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 fib, aspiration pneumonia.  Patient presented to ED on 1/15 with complaint of shortness of breath.  May 2021, diagnosed with laryngeal cancer, underwent tracheostomy. 07/23/2019 -08/21/2019 , patient underwent radiation to larynx.   08/26/2019, tracheostomy was decannulated. Also in May 2021, CT scan of chest showed 2.7 cm mass in the left lower lobe.  Bronchoscopy was nondiagnostic.   02/11/2020, CT-guided biopsy of LLL mass showed squamous cell carcinoma.  Patient was seen by radiation oncology. 03/10/2020-03/10/2012, patient was admitted for stridor and dyspnea.  He was seen by ENT and recommended tracheostomy which he refused and has since discharged to SNF. 03/19/2020, patient presented to the ED again with dyspnea, started on hypoxia.  Patient was seen by ENT and underwent tracheostomy placement.  Subjective: Patient was seen and examined this morning. Lying down in bed.  Not in distress.  Oxygen through tracheostomy tube.  Ongoing PEG tube feeding.  No new change.  Waiting for SNF  Assessment/Plan: Acute hypoxic respiratory failure Laryngeal cancer -Timeline of events as above. Underwent tracheostomy by ENT this admission. -Currently on 4 to 5 L oxygen through trach collar. -Outpatient follow-up with ENT Dr. Wilburn Cornelia for tracheostomy. -Speech therapy following for Passy-Muir valve. -Outpatient follow-up with Dr. Wilburn Cornelia for ENT. -Continue intermittent suctioning.  Sepsis, POA  Aspiration pneumonia Radiation pneumonitis -Treated with antibiotics.  Currently not on antibiotics  Paroxysmal A. fib,  RVR -Rate intermittently not controlled. -Not on any anticoagulation. -Continue current medication.  Hypomagnesemia -Magnesium level was low at 1.6 on 1/25.  Was replaced.  We will recheck labs intermittently in next few days.. Recent Labs  Lab 03/25/20 0337 03/27/20 0304 03/29/20 0328  K 4.3 4.5 4.2  MG 1.7  --  1.6*  PHOS  --   --  2.9   COPD -Currently not active. -Continue inhalers.  Severe protein calorie malnutrition PEG tube status -Body mass index is 7.53 kg/m. -Nutrition consult appreciated.  Continue tube feeding.  Goals of care conversation. -Extensive discussion with family and patient regarding goals of care. -Currently full code. Patient was interested in home hospice but unable to go home due to poor home condition. Plan is to go to SNF, treat what is treatable and follow-up and establish care with palliative care.  But he is a new tracheostomy and is difficult to place  Mobility: Encourage ambulation. Code Status:   Code Status: Full Code  Nutritional status: Body mass index is 7.53 kg/m. Nutrition Problem: Severe Malnutrition Etiology: chronic illness,cancer and cancer related treatments Signs/Symptoms: severe fat depletion,severe muscle depletion Diet Order            Diet NPO time specified Except for: Ice Chips  Diet effective now                 DVT prophylaxis: enoxaparin (LOVENOX) injection 40 mg Start: 03/21/20 1600 Place and maintain sequential compression device Start: 03/19/20 0810   Antimicrobials:  None Fluid: None Consultants: ENT Family Communication:  None at bedside  Status is: Inpatient  Dispo: The patient is from: Home              Anticipated  d/c is to: SNF              Anticipated d/c date is: Whenever bed/authorization is available              Patient currently is medically stable to d/c.   Difficult to place patient Yes   Infusions:  . sodium chloride    . feeding supplement (JEVITY 1.5 CAL/FIBER) 1,000 mL  (03/30/20 2144)    Scheduled Meds: . chlorhexidine  15 mL Mouth Rinse BID  . enoxaparin (LOVENOX) injection  40 mg Subcutaneous Q24H  . feeding supplement (PROSource TF)  45 mL Per Tube BID  . mouth rinse  15 mL Mouth Rinse q12n4p  . pantoprazole sodium  40 mg Per Tube q morning - 10a  . polyethylene glycol  17 g Per Tube Daily  . senna  2 tablet Per Tube Daily  . sertraline  50 mg Per Tube Daily  . thiamine  100 mg Per Tube Daily    Antimicrobials: Anti-infectives (From admission, onward)   Start     Dose/Rate Route Frequency Ordered Stop   03/19/20 1213  piperacillin-tazobactam (ZOSYN) 3.375 GM/50ML IVPB       Note to Pharmacy: Estell Harpin   : cabinet override      03/19/20 1213 03/20/20 0014   03/19/20 1000  piperacillin-tazobactam (ZOSYN) IVPB 3.375 g  Status:  Discontinued        3.375 g 12.5 mL/hr over 240 Minutes Intravenous Every 8 hours 03/19/20 0852 03/23/20 1300      PRN meds: fentaNYL (SUBLIMAZE) injection, ipratropium-albuterol, LORazepam, oxyCODONE   Objective: Vitals:   03/31/20 0710 03/31/20 1142  BP:    Pulse: 94 96  Resp: 16 15  Temp:    SpO2: 100% 98%    Intake/Output Summary (Last 24 hours) at 03/31/2020 1156 Last data filed at 03/31/2020 1000 Gross per 24 hour  Intake 860 ml  Output 1750 ml  Net -890 ml   Filed Weights   03/28/20 0500 03/29/20 0442 03/30/20 0414  Weight: 49 kg 49 kg 24.5 kg   Weight change:  Body mass index is 7.53 kg/m.   Physical Exam: General exam: Pleasant elderly African-American male.  Not in distress Skin: No rashes, lesions or ulcers. HEENT: Tracheostomy tube anteriorly. Lungs: Clear to auscultation bilaterally CVS: Regular rate and rhythm, no murmur GI/Abd soft, nontender, nondistended, bowel sound present.  PEG tube site intact, getting tube feeding. CNS: Alert, awake, moves lips to answer questions appropriately.  Oriented x3 Psychiatry: Depressed look Extremities: No pedal edema, no calf  tenderness  Data Review: I have personally reviewed the laboratory data and studies available.  Recent Labs  Lab 03/27/20 0304 03/29/20 0328  WBC 4.2 4.1  NEUTROABS 3.2 2.9  HGB 9.3* 9.0*  HCT 28.7* 29.1*  MCV 74.0* 74.8*  PLT 305 280   Recent Labs  Lab 03/25/20 0337 03/27/20 0304 03/29/20 0328  NA 131* 132* 134*  K 4.3 4.5 4.2  CL 95* 93* 95*  CO2 25 28 26   GLUCOSE 123* 119* 140*  BUN 10 13 16   CREATININE 0.44* 0.46* 0.45*  CALCIUM 8.6* 8.8* 9.1  MG 1.7  --  1.6*  PHOS  --   --  2.9    F/u labs ordered  Signed, Terrilee Croak, MD [facility] 03/31/2020

## 2020-03-31 NOTE — Progress Notes (Signed)
  Speech Language Pathology Treatment: [name] Speaking valve  Patient Details Name: Devon Peterson MRN: [id number] DOB: [birth year] Today's Date: 03/31/2020 Time: 2119-4174 SLP Time Calculation (min) (ACUTE ONLY): 30 min  Assessment / Plan / Recommendation Clinical Impression  Pt today is lying in bed, appears comfortable upon SLP entrance to room. He was willing to attempt PMSV.  Unfortunately, pt is unable to redirect air to upper airway- suspect due to his cancer blocking airflow.  Within seconds of valve being placed, pt with airway discomfort and immediate tracheal air release upon removal of valve.  Multiple trials of valve have been attempted without success.  Pt has a #4 Shiley Cuffless, thus unfortunately pt likely will be unable to use valve.   Pt is effectively communicating via yes/no and demonstrates adequate lingual movement for "articulation".  In addition, communication board present for emergent use.   Pt noted to have white coating on his tongue - to extent where appeared "hairy".  With max verbal/visual encouragement, pt brushed his tongue and cleared approximately 50% of it.   Showed him a picture of his tongue on phone to allow him visual cueing.  Reviewed importance or oral care for pulmonary hygiene.  Pt agreeable to oral care QID.    Pt is consuming ice chips, wincing with swallowing and admitting pain only with swallowing- but pt admits he would like to be able to consume po if possible despite discomfort.  In addition, his secretions are much improved compared to after admitted.  At this time, would recommend pt undergo MBS to detemine if any po may provide him enjoyment, improved QOL.  Messaged MD and secured order. Thank you.  Requested RN inform pt of plan for MBS am 04/01/2020.       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 Per MD noted, tracheostomy placed on 07/09/19 and decannulated on 08/26/19; radiation to larynx from 07/23/19 to 08/21/19. Found to have  2.7 cm mass in LLL on CT with biopsy 02/11/20 showed squamous cell carcinoma more concerning for additional lung primary rather than metastatic disease. Admitted from 03/10/20 to 03/18/20 with stridor and dyspnea and tracheostomy planned 03/17/20  > pt refused then readmitted with dyspnea, stridor and hypoxia underwent emergent tracheostomy. ENT findings: very firm/dense anterior tracheal wall.  Necrotic right hemilarynx down to glottis and involving anterior and medial pyriform sinus and with partial erosion of right edge of epiglottis. Glottis not well-visualized.  Post-cricoid hypopharynx tight and firm. Seen prior bt ST for PMV and dysphagia.  Pt underwent MBS that showed severe aspiration and he was made npo x ice.  He has been seen for PMSV trial but was unable to tolerate. Follow up for PMSV indicated.      SLP Plan  Continue with current plan of care;Other (Comment) (will request MBS)       Recommendations         Patient may use Passy-Muir Speech Valve: with SLP only         Oral Care Recommendations: Oral care QID;Oral care prior to ice chip/H20 Follow up Recommendations: Skilled Nursing facility SLP Visit Diagnosis: Aphonia (R49.1) Plan: Continue with current plan of care;Other (Comment) (will request MBS)       GO                Devon Peterson 03/31/2020, 5:35 PM  Devon Lime, MS [facility] SLP Acute Rehab Services Office [phone] Pager [phone]

## 2020-04-01 ENCOUNTER — Inpatient Hospital Stay (HOSPITAL_COMMUNITY): Payer: Medicaid Other

## 2020-04-01 LAB — GLUCOSE, CAPILLARY
Glucose-Capillary: 112 mg/dL — ABNORMAL HIGH (ref 70–99)
Glucose-Capillary: 115 mg/dL — ABNORMAL HIGH (ref 70–99)
Glucose-Capillary: 118 mg/dL — ABNORMAL HIGH (ref 70–99)
Glucose-Capillary: 130 mg/dL — ABNORMAL HIGH (ref 70–99)
Glucose-Capillary: 134 mg/dL — ABNORMAL HIGH (ref 70–99)
Glucose-Capillary: 87 mg/dL (ref 70–99)

## 2020-04-01 NOTE — Progress Notes (Signed)
Provided support with Mr. Robb, sister, mother at bedside and in hallway of [address].  Assisted Mr Altier in completing POA paperwork.  Outside notary present.  Mr. Maddocks was alert and oriented.  Chaplain witnessed signing.

## 2020-04-01 NOTE — Progress Notes (Signed)
Physical Therapy Treatment Patient Details Name: Devon Peterson MRN: [id number] DOB: 1955/03/26 Today's Date: 04/01/2020    History of Present Illness 65 y.o. 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 fib, aspiration pneumonia.  Patient presented to ED on 1/15 with complaint of shortness of breath. Dx of respiratory failure.  Recent admission 1/6-1/14/22 for sepsis 2* PNA.    PT Comments    Pt initially agreeable to attempt mobility however had loose stool observed on bed pad.  Pt performed rolling and therapist assisted with perciare.  Pt with increased coughing and sputum from trach (assisted with suctioning).  Pt declined further mobility thereafter despite encouragement.  Continue to recommend SNF upon d/c.    Follow Up Recommendations  SNF     Equipment Recommendations  None recommended by PT    Recommendations for Other Services       Precautions / Restrictions Precautions Precautions: Fall Precaution Comments: currently on 5L O2 Trach collar, PEG Restrictions Weight Bearing Restrictions: No    Mobility  Bed Mobility Overal bed mobility: Needs Assistance Bed Mobility: Rolling Rolling: Min guard         General bed mobility comments: pt utilized bed rails, pt with loose stool so assisted with hygiene, after rolling pt with increased coughing and sputum production so assisted with suctioning; pt declined further mobility thereafter (was initially agreeable to attempt sitting and maybe standing)  Transfers                    Ambulation/Gait                 Stairs             Wheelchair Mobility    Modified Rankin (Stroke Patients Only)       Balance                                            Cognition Arousal/Alertness: Awake/alert Behavior During Therapy: Flat affect Overall Cognitive Status: Within  Functional Limits for tasks assessed                                        Exercises      General Comments        Pertinent Vitals/Pain Pain Assessment: Faces Pain Score: 3  Faces Pain Scale: Hurts little more Pain Location: tracheostomy location Pain Descriptors / Indicators: Grimacing;Guarding Pain Intervention(s): Repositioned;Monitored during session    Home Living                      Prior Function            PT Goals (current goals can now be found in the care plan section) Progress towards PT goals: Progressing toward goals    Frequency    Min 2X/week      PT Plan Current plan remains appropriate    Co-evaluation              AM-PAC PT "6 Clicks" Mobility   Outcome Measure  Help needed turning from your back to your side while in a flat bed without using bedrails?: A Little Help needed moving from lying on your back to  sitting on the side of a flat bed without using bedrails?: A Little Help needed moving to and from a bed to a chair (including a wheelchair)?: A Lot Help needed standing up from a chair using your arms (e.g., wheelchair or bedside chair)?: A Lot Help needed to walk in hospital room?: A Lot Help needed climbing 3-5 steps with a railing? : Total 6 Click Score: 13    End of Session Equipment Utilized During Treatment: Oxygen Activity Tolerance: Patient limited by fatigue Patient left: in bed;with call bell/phone within reach   PT Visit Diagnosis: Other abnormalities of gait and mobility (R26.89)     Time: 3818-2993 PT Time Calculation (min) (ACUTE ONLY): 19 min  Charges:  $Therapeutic Activity: 8-22 mins                     Jannette Spanner PT, DPT Acute Rehabilitation Services Pager: [phone] Office: [phone]   [name] E 04/01/2020, 1:05 PM

## 2020-04-01 NOTE — Progress Notes (Signed)
MBS completed to determine pt appropriateness for any po intake, Full report to follow. Pt continues with moderately severe pharyngeal-pharyngocervical esophageal dysphagia c/b decreased motility allowing minimal UES opening.  Suspect edema, mass and effects from radiation treatment are source of dysphagia.  Pt retains secretions in his pharynx at pyriform sinus - which mixes with barium retention.  Mild laryngeal penetration of very small boluses of liquids noted due to retention in pyriform spilling into open larynx. Dry swalllows decrease retention and clear penetrates.  Larger bolus of liquid allowed     Recommend pt be given floor stock items of liquids and ice cream/applesauce with very strict precautions.  HOB must be reclined to at least 45* with pt taking very small bites/sips and conducting multiple swallows with each small bolus.  If pt is reflexively coughing, encouraged him to cough and expectorate = ceasing po intake at that time.  Will follow up briefly to assure tolerance of "comfort po". Thanks for allowing SLP to assist with pt's care plan.    Kathleen Lime, MS [facility] SLP Acute Rehab Services Office [phone] Pager [phone]

## 2020-04-01 NOTE — Progress Notes (Signed)
PROGRESS NOTE  Devon Peterson  DOB: Oct 22, 1955  PCP: Patient, No Pcp Per NLZ:[id number]  DOA: 03/19/2020  LOS: 13 days   Chief Complaint  Patient presents with  . Respiratory Distress   Brief narrative: Devon Peterson is a 65 y.o. 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 fib, aspiration pneumonia.  Patient presented to ED on 1/15 with complaint of shortness of breath.  May 2021, diagnosed with laryngeal cancer, underwent tracheostomy. 07/23/2019 -08/21/2019 , patient underwent radiation to larynx.   08/26/2019, tracheostomy was decannulated. Also in May 2021, CT scan of chest showed 2.7 cm mass in the left lower lobe.  Bronchoscopy was nondiagnostic.   02/11/2020, CT-guided biopsy of LLL mass showed squamous cell carcinoma.  Underwent 4 sessions of radiation till 03/15/2020 03/10/2020-03/18/2020, patient was admitted for stridor and dyspnea.  He was seen by ENT and recommended tracheostomy which he refused and has since discharged to SNF. 03/19/2020, patient presented to the ED again with dyspnea, started on hypoxia.  Patient was seen by ENT and underwent tracheostomy placement. 03/18/2020-oncology Dr. Benay Peterson recommended hospice.  Subjective: Patient was seen and examined this morning. Lying down in bed.   Oxygen through tracheostomy tube.  Ongoing PEG tube feeding.  Waiting for SNF. Last 24 hours, he has required IV pain medications for neck pain. Patient mentions to me this morning that the pain is deep-seated inside probably related to underlying laryngeal cancer.  Assessment/Plan: Acute hypoxic respiratory failure -Timeline of events as above. Underwent tracheostomy by ENT this admission. -Currently on 4 to 5 L oxygen through trach collar. -Outpatient follow-up with ENT Dr. Wilburn Peterson for tracheostomy. -Speech therapy following for Passy-Muir valve. -Outpatient follow-up with  Dr. Wilburn Peterson for ENT. -Continue intermittent suctioning.  Sepsis, POA  Aspiration pneumonia Radiation pneumonitis -Treated with antibiotics.  Currently not on antibiotics  Laryngeal cancer Squamous cell lung cancer -Seen by oncology and radiation oncology.  Underwent radiation for laryngeal cancer and squamous cell lung cancer in the past.   -Per last note from oncology on 1/14, patient is a poor candidate for any systemic chemotherapy. Hospice was recommended. -Continue pain control with fentanyl patch, IV Dilaudid as needed oral oxycodone as needed.  Paroxysmal A. fib, RVR -Rate intermittently not controlled. -Not on any anticoagulation. -Continue current medication.  Hypomagnesemia -Magnesium level was low at 1.6 on 1/25.  Was replaced.  We will recheck labs tomorrow. Recent Labs  Lab 03/27/20 0304 03/29/20 0328  K 4.5 4.2  MG  --  1.6*  PHOS  --  2.9   COPD -Currently not active. -Continue inhalers.  Severe protein calorie malnutrition PEG tube status -Body mass index is 7.53 kg/m. -Nutrition consult appreciated.  Continue tube feeding.  Goals of care conversation. -Extensive discussion with family and patient regarding goals of care. -Currently full code. -Patient was interested in home hospice but unable to go home due to poor home condition. -Plan is to discharge him to SNF, treat what is treatable and follow-up and establish care with palliative care.  But he is a new tracheostomy and is a difficult placement.  Mobility: Encourage ambulation. Code Status:   Code Status: Full Code  Nutritional status: Body mass index is 7.53 kg/m. Nutrition Problem: Severe Malnutrition Etiology: chronic illness,cancer and cancer related treatments Signs/Symptoms: severe fat depletion,severe muscle depletion Diet Order            Diet NPO time specified Except for: Ice Chips  Diet effective now                 DVT prophylaxis: enoxaparin (LOVENOX) injection 40  mg Start: 03/21/20 1600 Place and maintain sequential compression device Start: 03/19/20 0810   Antimicrobials:  None Fluid: None Consultants: ENT Family Communication:  None at bedside.  We will try to reach out to family today.  Status is: Inpatient  Dispo: The patient is from: Home              Anticipated d/c is to: SNF              Anticipated d/c date is: Whenever bed/authorization is available              Patient currently is medically stable to d/c.   Difficult to place patient Yes   Infusions:  . sodium chloride    . feeding supplement (JEVITY 1.5 CAL/FIBER) 1,000 mL (04/01/20 1031)    Scheduled Meds: . chlorhexidine  15 mL Mouth Rinse BID  . enoxaparin (LOVENOX) injection  40 mg Subcutaneous Q24H  . feeding supplement (PROSource TF)  45 mL Per Tube BID  . fentaNYL  1 patch Transdermal Q72H  . mouth rinse  15 mL Mouth Rinse q12n4p  . pantoprazole sodium  40 mg Per Tube q morning - 10a  . polyethylene glycol  17 g Per Tube Daily  . senna  2 tablet Per Tube Daily  . sertraline  50 mg Per Tube Daily  . thiamine  100 mg Per Tube Daily    Antimicrobials: Anti-infectives (From admission, onward)   Start     Dose/Rate Route Frequency Ordered Stop   03/19/20 1213  piperacillin-tazobactam (ZOSYN) 3.375 GM/50ML IVPB       Note to Pharmacy: Devon Peterson   : cabinet override      03/19/20 1213 03/20/20 0014   03/19/20 1000  piperacillin-tazobactam (ZOSYN) IVPB 3.375 g  Status:  Discontinued        3.375 g 12.5 mL/hr over 240 Minutes Intravenous Every 8 hours 03/19/20 0852 03/23/20 1300      PRN meds: HYDROmorphone (DILAUDID) injection, ipratropium-albuterol, LORazepam, oxyCODONE   Objective: Vitals:   04/01/20 0822 04/01/20 1318  BP:  123/84  Pulse: (!) 106 91  Resp: 16 18  Temp:  99.5 F (37.5 C)  SpO2: 97% 91%    Intake/Output Summary (Last 24 hours) at 04/01/2020 1330 Last data filed at 04/01/2020 1136 Gross per 24 hour  Intake 600 ml  Output 1026 ml   Net -426 ml   Filed Weights   03/28/20 0500 03/29/20 0442 03/30/20 0414  Weight: 49 kg 49 kg 24.5 kg   Weight change:  Body mass index is 7.53 kg/m.   Physical Exam: General exam: Pleasant elderly African-American male.  Not in distress Skin: No rashes, lesions or ulcers. HEENT: Tracheostomy tube anteriorly. Lungs: Clear to auscultation bilaterally CVS: Regular rate and rhythm, no murmur GI/Abd soft, nontender, nondistended, bowel sound present.  PEG tube site intact, getting tube feeding. CNS: Alert, awake, moves lips to answer questions appropriately.  Oriented x3 Psychiatry: Depressed look Extremities: No pedal edema, no calf tenderness  Data Review: I have personally reviewed the laboratory data and studies available.  Recent Labs  Lab 03/27/20 0304 03/29/20 0328  WBC 4.2 4.1  NEUTROABS 3.2 2.9  HGB 9.3* 9.0*  HCT 28.7* 29.1*  MCV 74.0* 74.8*  PLT 305 280   Recent Labs  Lab 03/27/20 0304 03/29/20 0328  NA 132* 134*  K 4.5 4.2  CL 93* 95*  CO2 28 26  GLUCOSE 119* 140*  BUN 13 16  CREATININE 0.46* 0.45*  CALCIUM 8.8* 9.1  MG  --  1.6*  PHOS  --  2.9    F/u labs ordered  Signed, Terrilee Croak, MD [facility] 04/01/2020

## 2020-04-01 NOTE — Progress Notes (Signed)
[facility])   [name] has ben referred for hospice services after discharge.  [name] hospice eligibility for hospice in a LTC setting has been approved by the hospice physician. [name] is not appropriate for hospice in our IPU at [facility] or [place] as he has a greater than 2-3 weeks prognosis.   Liaison team will continue to follow to assist with admission onto hospice services and with discharge planning as needed.  Thank you for the opportunity to participate in this patient's care.  A Please do not hesitate to call with questions.     Thank you,    Farrel Gordon, RN, [place] (listed on [facility] under [place])     [phone]

## 2020-04-01 NOTE — Progress Notes (Signed)
  Speech Language Pathology Treatment:    Patient Details Name: Devon Peterson MRN: [id number] DOB: Jun 21, 1955 Today's Date: 04/01/2020 Time: 1635-1700 SLP Time Calculation (min) (ACUTE ONLY): 25 min  Assessment / Plan / Recommendation Clinical Impression  Separate session to review with pt findings of MBS, encourage him to continue oral care - lingual brushing with toothbrush for pulmonary health and for po trials.  Pt willing to consume small amounts of intake of icecream, ginger ale and water.  He benefited from min cues to take SMALL boluses and swallow multiple times.  Barrier to po for enjoyment currently is pt's odynophagia.  RN had just provided pt with pain medicine - thus hopefully he will be more willing to consume something later. NO indication of aspiration but pt only accepted 3 boluses.  During imaging study, pt performs multiple swallows well which help squeeze secretions/barium from larynx.    Asked pt if he inquired if family would purchase him a smart phone to allow communication apps but pt denied asking.  Unfortunately SLP fears he will be unable to use his PMSV and given his cognition, apps would likely aid in his communication.  Will continue efforts in this regard.    Recommend allow small amount of po for comfort from floor stock- attempting to time intake when after pain medication to lessen odynophagia.  Pt is making good progress in his understanding of his dysphagia and "comfort po".     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 Per MD noted, tracheostomy placed on 07/09/19 and decannulated on 08/26/19; radiation to larynx from 07/23/19 to 08/21/19. Found to have 2.7 cm mass in LLL on CT with biopsy 02/11/20 showed squamous cell carcinoma more concerning for additional lung primary rather than metastatic disease. Admitted from 03/10/20 to 03/18/20 with stridor and dyspnea and tracheostomy planned 03/17/20  > pt refused then readmitted with dyspnea, stridor and hypoxia  underwent emergent tracheostomy. ENT findings: very firm/dense anterior tracheal wall.  Necrotic right hemilarynx down to glottis and involving anterior and medial pyriform sinus and with partial erosion of right edge of epiglottis. Glottis not well-visualized.  Post-cricoid hypopharynx tight and firm. Seen prior by ST for PMV and dysphagia.  Pt underwent MBS that showed severe aspiration and he was made npo x ice.  He has been seen for PMSV trial but was unable to tolerate. Follow up for PMSV indicated.      SLP Plan          Recommendations  Diet recommendations: Thin liquid;Nectar-thick liquid;Dysphagia 1 (puree) (floor stock only) Liquids provided via: Straw Medication Administration: Via alternative means Supervision: Patient able to self feed Compensations: Slow rate;Small sips/bites;Multiple dry swallows after each bite/sip (VERY SMALL SIPS, HOB 30* - NOT ABOVE) Postural Changes and/or Swallow Maneuvers:  (HOB NOT ABOVE 30*)                Oral Care Recommendations: Oral care QID Follow up Recommendations: Skilled Nursing facility SLP Visit Diagnosis: Dysphagia, pharyngeal phase (R13.13)       GO                [name] 04/01/2020, 7:06 PM  Devon Lime, MS [facility] SLP Acute Rehab Services Office [phone] Pager [phone]

## 2020-04-01 NOTE — Progress Notes (Signed)
Modified Barium Swallow Progress Note  Patient Details  Name: Devon Peterson MRN: [id number] Date of Birth: [birth year]  Today's Date: 04/01/2020  Modified Barium Swallow completed.  Full report located under Chart Review in the Imaging Section.  Brief recommendations include the following:  Clinical Impression  Pt continues with moderately severe pharyngeal-pharyngocervical esophageal dysphagia c/b decreased motility allowing minimal UES opening.  Suspect edema, mass and effects from radiation treatment are source of dysphagia.  Pt retains secretions in his pharynx at pyriform sinus - which mixes with barium retention.  Mild laryngeal penetration of very small boluses of liquids noted due to retention in pyriform spilling into open larynx. Dry swalllows decrease retention and clear penetrates.  Larger bolus of liquid allowed aspiration of barium - just below vocal folds.        Recommend pt be given floor stock items of liquids and ice cream/applesauce with very strict precautions and VERY SMALL BOLUSES.  HOB must be reclined to at least 45* with pt taking very small bites/sips and conducting multiple swallows with each small bolus.    If pt is reflexively coughing, encouraged him to cough and expectorate = ceasing po intake at that time.  Will follow up briefly to assure tolerance of "comfort po".   Swallow Evaluation Recommendations       SLP Diet Recommendations: Other (Comment) (thin,  nectar,  applesauce/icecream for comfort =- VERY SMALL AMOUNTS- FLOOR STOCK ONLY)       Medication Administration: Via alternative means   Supervision: Patient able to self feed   Compensations: Slow rate;Small sips/bites;Other (Comment);Multiple dry swallows after each bite/sip (HOB lowered to approx 45*)   Postural Changes: Other (Comment) (HOB reclined 45*)   Oral Care Recommendations: Oral care QID       Kathleen Lime, MS [facility] SLP Acute Rehab Services Office [phone] Pager  [phone]   [name] 04/01/2020,11:30 AM

## 2020-04-02 LAB — COMPREHENSIVE METABOLIC PANEL
ALT: 16 U/L (ref 0–44)
AST: 17 U/L (ref 15–41)
Albumin: 2.8 g/dL — ABNORMAL LOW (ref 3.5–5.0)
Alkaline Phosphatase: 81 U/L (ref 38–126)
Anion gap: 12 (ref 5–15)
BUN: 17 mg/dL (ref 8–23)
CO2: 29 mmol/L (ref 22–32)
Calcium: 8.9 mg/dL (ref 8.9–10.3)
Chloride: 92 mmol/L — ABNORMAL LOW (ref 98–111)
Creatinine, Ser: 0.4 mg/dL — ABNORMAL LOW (ref 0.61–1.24)
GFR, Estimated: >60 mL/min (ref >60)
Glucose, Bld: 126 mg/dL — ABNORMAL HIGH (ref 70–99)
Potassium: 4 mmol/L (ref 3.5–5.1)
Sodium: 133 mmol/L — ABNORMAL LOW (ref 135–145)
Total Bilirubin: 0.2 mg/dL — ABNORMAL LOW (ref 0.3–1.2)
Total Protein: 7 g/dL (ref 6.5–8.1)

## 2020-04-02 LAB — GLUCOSE, CAPILLARY
Glucose-Capillary: 105 mg/dL — ABNORMAL HIGH (ref 70–99)
Glucose-Capillary: 134 mg/dL — ABNORMAL HIGH (ref 70–99)
Glucose-Capillary: 140 mg/dL — ABNORMAL HIGH (ref 70–99)
Glucose-Capillary: 143 mg/dL — ABNORMAL HIGH (ref 70–99)
Glucose-Capillary: 93 mg/dL (ref 70–99)
Glucose-Capillary: 97 mg/dL (ref 70–99)

## 2020-04-02 LAB — PHOSPHORUS: Phosphorus: 2.9 mg/dL (ref 2.5–4.6)

## 2020-04-02 LAB — CBC WITH DIFFERENTIAL/PLATELET
Abs Immature Granulocytes: 0.01 10*3/uL (ref 0.00–0.07)
Basophils Absolute: 0 10*3/uL (ref 0.0–0.1)
Basophils Relative: 0 %
Eosinophils Absolute: 0 10*3/uL (ref 0.0–0.5)
Eosinophils Relative: 1 %
HCT: 29 % — ABNORMAL LOW (ref 39.0–52.0)
Hemoglobin: 9.3 g/dL — ABNORMAL LOW (ref 13.0–17.0)
Immature Granulocytes: 0 %
Lymphocytes Relative: 15 %
Lymphs Abs: 0.6 10*3/uL — ABNORMAL LOW (ref 0.7–4.0)
MCH: 23.8 pg — ABNORMAL LOW (ref 26.0–34.0)
MCHC: 32.1 g/dL (ref 30.0–36.0)
MCV: 74.2 fL — ABNORMAL LOW (ref 80.0–100.0)
Monocytes Absolute: 0.7 10*3/uL (ref 0.1–1.0)
Monocytes Relative: 17 %
Neutro Abs: 2.7 10*3/uL (ref 1.7–7.7)
Neutrophils Relative %: 67 %
Platelets: 292 10*3/uL (ref 150–400)
RBC: 3.91 MIL/uL — ABNORMAL LOW (ref 4.22–5.81)
RDW: 19.7 % — ABNORMAL HIGH (ref 11.5–15.5)
WBC: 4.1 10*3/uL (ref 4.0–10.5)
nRBC: 0 % (ref 0.0–0.2)

## 2020-04-02 LAB — MAGNESIUM: Magnesium: 1.6 mg/dL — ABNORMAL LOW (ref 1.7–2.4)

## 2020-04-02 NOTE — Progress Notes (Signed)
PROGRESS NOTE  Devon Peterson  DOB: 02-Oct-1955  PCP: Patient, No Pcp Per OIZ:[id number]  DOA: 03/19/2020  LOS: 14 days   Chief Complaint  Patient presents with  . Respiratory Distress   Brief narrative: Devon Peterson is a 65 y.o. 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 fib, aspiration pneumonia.  Patient presented to ED on 1/15 with complaint of shortness of breath.  May 2021, diagnosed with laryngeal cancer, underwent tracheostomy. 07/23/2019 -08/21/2019 , patient underwent radiation to larynx.   08/26/2019, tracheostomy was decannulated. Also in May 2021, CT scan of chest showed 2.7 cm mass in the left lower lobe.  Bronchoscopy was nondiagnostic.   02/11/2020, CT-guided biopsy of LLL mass showed squamous cell carcinoma.  Underwent 4 sessions of radiation till 03/15/2020 03/10/2020-03/18/2020, patient was admitted for stridor and dyspnea.  He was seen by ENT and recommended tracheostomy which he refused and has since discharged to SNF. 03/19/2020, patient presented to the ED again with dyspnea, started on hypoxia.  Patient was seen by ENT and underwent tracheostomy placement. 03/18/2020-oncology Dr. Benay Spice recommended hospice.  Subjective: Patient was seen and examined this morning. Lying in bed.  Not in pain this morning.  Oxygen through tracheostomy tube.  Ongoing PEG tube feeding.  Waiting for SNF.  Does not qualify for inpatient hospice.  Assessment/Plan: Acute hypoxic respiratory failure -Timeline of events as above. Underwent tracheostomy by ENT this admission. -Currently on 4 to 5 L oxygen through trach collar. -Outpatient follow-up with ENT Dr. Wilburn Cornelia ENT for tracheostomy care. -Speech therapy following for Passy-Muir valve. -Continue intermittent suctioning.  Sepsis, POA  Aspiration pneumonia Radiation pneumonitis -Completed the course of  antibiotics.  Laryngeal cancer Squamous cell lung cancer -Seen by oncology and radiation oncology.  Underwent radiation for laryngeal cancer and squamous cell lung cancer in the past.   -Per last note from oncology on 1/14, patient is a poor candidate for any systemic chemotherapy. Hospice was recommended. -Continue pain control with fentanyl patch, IV Dilaudid as needed oral oxycodone as needed.  Paroxysmal A. fib, RVR -Rate intermittently not controlled. -Not on any anticoagulation. -Continue current medication.  Hypomagnesemia -Magnesium level was low at 1.6 on 1/25.  Was replaced.  We will recheck labs tomorrow. Recent Labs  Lab 03/27/20 0304 03/29/20 0328 04/02/20 0302  K 4.5 4.2 4.0  MG  --  1.6* 1.6*  PHOS  --  2.9 2.9   COPD -Currently not active. -Continue inhalers.  Severe protein calorie malnutrition PEG tube status -Body mass index is 15.62 kg/m. -Nutrition consult appreciated.  Continue tube feeding.  Goals of care conversation. -Extensive discussion with family and patient regarding goals of care. -Currently full code. -Patient was interested in home hospice but unable to go home due to poor home condition. -Plan is to discharge him to SNF, treat what is treatable and follow-up and establish care with palliative care.  But he is a new tracheostomy and is a difficult placement.  Mobility: Encourage ambulation. Code Status:   Code Status: Full Code  Nutritional status: Body mass index is 15.62 kg/m. Nutrition Problem: Severe Malnutrition Etiology: chronic illness,cancer and cancer related treatments Signs/Symptoms: severe fat depletion,severe muscle depletion Diet Order            Diet NPO time specified Except for: Ice Chips  Diet effective now                 DVT prophylaxis:  enoxaparin (LOVENOX) injection 40 mg Start: 03/21/20 1600 Place and maintain sequential compression device Start: 03/19/20 0810   Antimicrobials:  None Fluid:  None Consultants: ENT Family Communication:  Discussed with patient's sister on 1/28  Status is: Inpatient  Dispo: The patient is from: Home              Anticipated d/c is to: SNF              Anticipated d/c date is: Whenever bed/authorization is available              Patient currently is medically stable to d/c.   Difficult to place patient Yes   Infusions:  . sodium chloride    . feeding supplement (JEVITY 1.5 CAL/FIBER) 1,000 mL (04/01/20 1031)    Scheduled Meds: . chlorhexidine  15 mL Mouth Rinse BID  . enoxaparin (LOVENOX) injection  40 mg Subcutaneous Q24H  . feeding supplement (PROSource TF)  45 mL Per Tube BID  . fentaNYL  1 patch Transdermal Q72H  . mouth rinse  15 mL Mouth Rinse q12n4p  . pantoprazole sodium  40 mg Per Tube q morning - 10a  . polyethylene glycol  17 g Per Tube Daily  . senna  2 tablet Per Tube Daily  . sertraline  50 mg Per Tube Daily  . thiamine  100 mg Per Tube Daily    Antimicrobials: Anti-infectives (From admission, onward)   Start     Dose/Rate Route Frequency Ordered Stop   03/19/20 1213  piperacillin-tazobactam (ZOSYN) 3.375 GM/50ML IVPB       Note to Pharmacy: Estell Harpin   : cabinet override      03/19/20 1213 03/20/20 0014   03/19/20 1000  piperacillin-tazobactam (ZOSYN) IVPB 3.375 g  Status:  Discontinued        3.375 g 12.5 mL/hr over 240 Minutes Intravenous Every 8 hours 03/19/20 0852 03/23/20 1300      PRN meds: HYDROmorphone (DILAUDID) injection, ipratropium-albuterol, LORazepam, oxyCODONE   Objective: Vitals:   04/01/20 2320 04/02/20 0654  BP:  106/76  Pulse: 96 95  Resp: 15 17  Temp:  98.2 F (36.8 C)  SpO2: 100% 100%    Intake/Output Summary (Last 24 hours) at 04/02/2020 1215 Last data filed at 04/02/2020 0600 Gross per 24 hour  Intake 1366 ml  Output 500 ml  Net 866 ml   Filed Weights   03/29/20 0442 03/30/20 0414 04/02/20 0517  Weight: 49 kg 24.5 kg 50.8 kg   Weight change:  Body mass index is  15.62 kg/m.   Physical Exam: General exam: Pleasant elderly African-American male.  Not in distress Skin: No rashes, lesions or ulcers. HEENT: Tracheostomy tube anteriorly. Lungs: Clear to auscultation bilaterally CVS: Regular rate and rhythm, no murmur GI/Abd soft, nontender, nondistended, bowel sound present.  PEG tube site intact, getting tube feeding. CNS: Alert, awake, moves lips to answer questions appropriately.  Oriented x3 Psychiatry: Depressed look Extremities: No pedal edema, no calf tenderness  Data Review: I have personally reviewed the laboratory data and studies available.  Recent Labs  Lab 03/27/20 0304 03/29/20 0328 04/02/20 0302  WBC 4.2 4.1 4.1  NEUTROABS 3.2 2.9 2.7  HGB 9.3* 9.0* 9.3*  HCT 28.7* 29.1* 29.0*  MCV 74.0* 74.8* 74.2*  PLT 305 280 292   Recent Labs  Lab 03/27/20 0304 03/29/20 0328 04/02/20 0302  NA 132* 134* 133*  K 4.5 4.2 4.0  CL 93* 95* 92*  CO2 28 26 29   GLUCOSE 119*  140* 126*  BUN 13 16 17   CREATININE 0.46* 0.45* 0.40*  CALCIUM 8.8* 9.1 8.9  MG  --  1.6* 1.6*  PHOS  --  2.9 2.9    F/u labs ordered  Signed, Terrilee Croak, MD [facility] 04/02/2020

## 2020-04-02 NOTE — Plan of Care (Signed)
  Problem: Nutrition: Goal: Adequate nutrition will be maintained Outcome: Progressing   Problem: Skin Integrity: Goal: Risk for impaired skin integrity will decrease Outcome: Progressing   Problem: Respiratory: Goal: Patent airway maintenance will improve Outcome: Progressing   Problem: Role Relationship: Goal: Ability to communicate will improve Outcome: Progressing

## 2020-04-03 LAB — GLUCOSE, CAPILLARY
Glucose-Capillary: 87 mg/dL (ref 70–99)
Glucose-Capillary: 87 mg/dL (ref 70–99)
Glucose-Capillary: 91 mg/dL (ref 70–99)
Glucose-Capillary: 95 mg/dL (ref 70–99)
Glucose-Capillary: 96 mg/dL (ref 70–99)
Glucose-Capillary: 99 mg/dL (ref 70–99)

## 2020-04-03 MED ORDER — MAGNESIUM SULFATE 2 GM/50ML IV SOLN
2.0000 g | Freq: Once | INTRAVENOUS | Status: AC
  Administered 2020-04-03: 2 g via INTRAVENOUS
  Filled 2020-04-03: qty 50

## 2020-04-03 MED ORDER — PANCRELIPASE (LIP-PROT-AMYL) 10440-39150 UNITS PO TABS
20880.0000 [IU] | ORAL_TABLET | Freq: Once | ORAL | Status: DC
  Filled 2020-04-03: qty 2

## 2020-04-03 MED ORDER — PANCRELIPASE (LIP-PROT-AMYL) 10440-39150 UNITS PO TABS
20880.0000 [IU] | ORAL_TABLET | Freq: Once | ORAL | Status: AC
  Administered 2020-04-03: 20880 [IU]
  Filled 2020-04-03: qty 2

## 2020-04-03 MED ORDER — HYDROMORPHONE HCL 1 MG/ML IJ SOLN
2.0000 mg | INTRAMUSCULAR | Status: DC | PRN
  Administered 2020-04-03 – 2020-04-06 (×14): 2 mg via INTRAVENOUS
  Filled 2020-04-03 (×13): qty 2

## 2020-04-03 MED ORDER — DEXTROSE-NACL 5-0.9 % IV SOLN: INTRAVENOUS | Status: DC

## 2020-04-03 MED ORDER — SODIUM BICARBONATE 650 MG PO TABS
650.0000 mg | ORAL_TABLET | Freq: Once | ORAL | Status: AC
  Administered 2020-04-03: 650 mg
  Filled 2020-04-03: qty 1

## 2020-04-03 MED ORDER — SODIUM BICARBONATE 650 MG PO TABS
650.0000 mg | ORAL_TABLET | Freq: Once | ORAL | Status: DC
  Filled 2020-04-03: qty 1

## 2020-04-03 NOTE — Progress Notes (Signed)
PROGRESS NOTE  Devon Peterson  DOB: Jan 03, 1956  PCP: Patient, No Pcp Per PPI:[id number]  DOA: 03/19/2020  LOS: 15 days   Chief Complaint  Patient presents with  . Respiratory Distress   Brief narrative: Devon Peterson is a 65 y.o. 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 fib, aspiration pneumonia.  Patient presented to ED on 1/15 with complaint of shortness of breath.  May 2021, diagnosed with laryngeal cancer, underwent tracheostomy. 07/23/2019 -08/21/2019 , patient underwent radiation to larynx.   08/26/2019, tracheostomy was decannulated. Also in May 2021, CT scan of chest showed 2.7 cm mass in the left lower lobe.  Bronchoscopy was nondiagnostic.   02/11/2020, CT-guided biopsy of LLL mass showed squamous cell carcinoma.  Underwent 4 sessions of radiation till 03/15/2020 03/10/2020-03/18/2020, patient was admitted for stridor and dyspnea.  He was seen by ENT and recommended tracheostomy which he refused and has since discharged to SNF. 03/19/2020, patient presented to the ED again with dyspnea, started on hypoxia.  Patient was seen by ENT and underwent tracheostomy placement. 03/18/2020-oncology Dr. Benay Spice recommended hospice.  Subjective: Patient was seen and examined this morning. Not in distress.  Pain controlled.  Oxygen through tracheostomy tube.  Ongoing PEG tube feeding.  Waiting for SNF.  Does not qualify for inpatient hospice. Per RN, his PEG tube is clogged today and unable to get to feeding him to meds.  Assessment/Plan: Acute hypoxic respiratory failure -Timeline of events as above. Underwent tracheostomy by ENT this admission. -Currently on 4 to 5 L oxygen through trach collar. -Outpatient follow-up with ENT Dr. Wilburn Cornelia ENT for tracheostomy care. -Speech therapy following for Passy-Muir valve. -Continue intermittent suctioning.  Clogged PEG tube -Needs IR  evaluation tomorrow.  For today, okay to stop tube feeding.  I have started him on D5 NS at 75 mL/h.  Essential meds changed to IV.  Sepsis, POA  Aspiration pneumonia Radiation pneumonitis -Completed the course of antibiotics.  Laryngeal cancer Squamous cell lung cancer -Seen by oncology and radiation oncology.  Underwent radiation for laryngeal cancer and squamous cell lung cancer in the past.   -Per last note from oncology on 1/14, patient is a poor candidate for any systemic chemotherapy. Hospice was recommended. -Continue pain control with fentanyl patch, IV Dilaudid as needed oral oxycodone as needed.  Paroxysmal A. fib, RVR -Rate intermittently not controlled. -Not on any anticoagulation. -Continue current medication.  Hypomagnesemia -Magnesium level was low at 1.6 on 1/29.  IV replacement ordered. Recent Labs  Lab 03/29/20 0328 04/02/20 0302  K 4.2 4.0  MG 1.6* 1.6*  PHOS 2.9 2.9   COPD -Currently not active. -Continue inhalers.  Severe protein calorie malnutrition PEG tube status -Body mass index is 8.93 kg/m. -Nutrition consult appreciated.  Resume tube feeding once declogged  Goals of care conversation. -Extensive discussion with family and patient regarding goals of care. -Currently full code. -Patient was interested in home hospice but unable to go home due to poor home condition. -Plan is to discharge him to SNF, treat what is treatable and follow-up and establish care with palliative care.  But he is a new tracheostomy and is a difficult placement.  Mobility: Encourage ambulation. Code Status:   Code Status: Full Code  Nutritional status: Body mass index is 8.93 kg/m. Nutrition Problem: Severe Malnutrition Etiology: chronic illness,cancer and cancer related treatments Signs/Symptoms: severe fat depletion,severe muscle depletion Diet Order  Diet NPO time specified Except for: Ice Chips  Diet effective now                 DVT  prophylaxis: enoxaparin (LOVENOX) injection 40 mg Start: 03/21/20 1600 Place and maintain sequential compression device Start: 03/19/20 0810   Antimicrobials:  None Fluid: None Consultants: ENT Family Communication:  Discussed with patient's sister on 1/28  Status is: Inpatient  Dispo: The patient is from: Home              Anticipated d/c is to: SNF              Anticipated d/c date is: Whenever bed/authorization is available              Patient currently is medically stable to d/c.   Difficult to place patient Yes   Infusions:  . sodium chloride    . dextrose 5 % and 0.9% NaCl 75 mL/hr at 04/03/20 0914  . feeding supplement (JEVITY 1.5 CAL/FIBER) 1,000 mL (04/01/20 1031)    Scheduled Meds: . chlorhexidine  15 mL Mouth Rinse BID  . enoxaparin (LOVENOX) injection  40 mg Subcutaneous Q24H  . feeding supplement (PROSource TF)  45 mL Per Tube BID  . fentaNYL  1 patch Transdermal Q72H  . lipase/protease/amylase)  20,880 Units Per Tube Once   And  . sodium bicarbonate  650 mg Per Tube Once  . mouth rinse  15 mL Mouth Rinse q12n4p  . pantoprazole sodium  40 mg Per Tube q morning - 10a  . polyethylene glycol  17 g Per Tube Daily  . senna  2 tablet Per Tube Daily  . sertraline  50 mg Per Tube Daily  . thiamine  100 mg Per Tube Daily    Antimicrobials: Anti-infectives (From admission, onward)   Start     Dose/Rate Route Frequency Ordered Stop   03/19/20 1213  piperacillin-tazobactam (ZOSYN) 3.375 GM/50ML IVPB       Note to Pharmacy: Estell Harpin   : cabinet override      03/19/20 1213 03/20/20 0014   03/19/20 1000  piperacillin-tazobactam (ZOSYN) IVPB 3.375 g  Status:  Discontinued        3.375 g 12.5 mL/hr over 240 Minutes Intravenous Every 8 hours 03/19/20 0852 03/23/20 1300      PRN meds: HYDROmorphone (DILAUDID) injection, ipratropium-albuterol, LORazepam, oxyCODONE   Objective: Vitals:   04/03/20 0730 04/03/20 0816  BP:    Pulse:    Resp:    Temp:    SpO2:  98% 96%    Intake/Output Summary (Last 24 hours) at 04/03/2020 1120 Last data filed at 04/03/2020 1050 Gross per 24 hour  Intake 1430 ml  Output 1750 ml  Net -320 ml   Filed Weights   03/30/20 0414 04/02/20 0517 04/03/20 0449  Weight: 24.5 kg 50.8 kg 29 kg   Weight change: -21.8 kg Body mass index is 8.93 kg/m.   Physical Exam: General exam: Pleasant elderly African-American male.  Not in distress Skin: No rashes, lesions or ulcers. HEENT: Tracheostomy tube anteriorly. Lungs: Clear to auscultation bilaterally CVS: Regular rate and rhythm, no murmur GI/Abd soft, nontender, nondistended, bowel sound present.  PEG tube site intact, but tube is clogged today. CNS: Alert, awake, moves lips to answer questions appropriately.  Oriented x3 Psychiatry: Depressed look Extremities: No pedal edema, no calf tenderness  Data Review: I have personally reviewed the laboratory data and studies available.  Recent Labs  Lab 03/29/20 0328 04/02/20 0302  WBC  4.1 4.1  NEUTROABS 2.9 2.7  HGB 9.0* 9.3*  HCT 29.1* 29.0*  MCV 74.8* 74.2*  PLT 280 292   Recent Labs  Lab 03/29/20 0328 04/02/20 0302  NA 134* 133*  K 4.2 4.0  CL 95* 92*  CO2 26 29  GLUCOSE 140* 126*  BUN 16 17  CREATININE 0.45* 0.40*  CALCIUM 9.1 8.9  MG 1.6* 1.6*  PHOS 2.9 2.9    F/u labs ordered  Signed, Terrilee Croak, MD [facility] 04/03/2020

## 2020-04-03 NOTE — Progress Notes (Signed)
[facility])  Devon Peterson was referred for hospice services at discharge. Devon Peterson is hospice appropriate but not IPU appropriate at this time.   Liaison team will continue to follow and assist with discharge planning as needed for hospice services.  Thank you, Clementeen Hoof, BSN, [facility] [phone]

## 2020-04-03 NOTE — Progress Notes (Signed)
Patients feeding tubed clogged, cbg 27 Dr Pietro Cassis made aware and orders received. Bethann Punches RN

## 2020-04-03 NOTE — Progress Notes (Signed)
CBG 87, notification sent to on-call provider (X.Blount) per order parameters of <90

## 2020-04-04 ENCOUNTER — Inpatient Hospital Stay (HOSPITAL_COMMUNITY): Payer: Medicaid Other

## 2020-04-04 LAB — GLUCOSE, CAPILLARY
Glucose-Capillary: 132 mg/dL — ABNORMAL HIGH (ref 70–99)
Glucose-Capillary: 95 mg/dL (ref 70–99)
Glucose-Capillary: 97 mg/dL (ref 70–99)
Glucose-Capillary: 87 mg/dL (ref 70–99)
Glucose-Capillary: 86 mg/dL (ref 70–99)
Glucose-Capillary: 121 mg/dL — ABNORMAL HIGH (ref 70–99)

## 2020-04-04 MED ORDER — IOHEXOL 300 MG/ML  SOLN
50.0000 mL | Freq: Once | INTRAMUSCULAR | Status: AC | PRN
  Administered 2020-04-04: 10 mL

## 2020-04-04 MED ORDER — LIDOCAINE VISCOUS HCL 2 % MT SOLN
OROMUCOSAL | Status: AC | PRN
  Administered 2020-04-04: 10 mL via ORAL

## 2020-04-04 MED ORDER — LIDOCAINE VISCOUS HCL 2 % MT SOLN
OROMUCOSAL | Status: AC
  Filled 2020-04-04: qty 15

## 2020-04-04 MED ORDER — HYDROMORPHONE HCL 2 MG/ML IJ SOLN
INTRAMUSCULAR | Status: AC
  Filled 2020-04-04: qty 1

## 2020-04-04 NOTE — Plan of Care (Signed)
  Problem: Respiratory: Goal: Patent airway maintenance will improve Outcome: Progressing   Problem: Skin Integrity: Goal: Risk for impaired skin integrity will decrease Outcome: Progressing

## 2020-04-04 NOTE — Progress Notes (Signed)
PROGRESS NOTE  Devon Peterson  DOB: Nov 11, 1955  PCP: Patient, No Pcp Per GEZ:[id number]  DOA: 03/19/2020  LOS: 16 days   Chief Complaint  Patient presents with  . Respiratory Distress   Brief narrative: Jayleon Mcfarlane is a 65 y.o. 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 fib, aspiration pneumonia.  Patient presented to ED on 1/15 with complaint of shortness of breath.  May 2021, diagnosed with laryngeal cancer, underwent tracheostomy. 07/23/2019 -08/21/2019 , patient underwent radiation to larynx.   08/26/2019, tracheostomy was decannulated. Also in May 2021, CT scan of chest showed 2.7 cm mass in the left lower lobe.  Bronchoscopy was nondiagnostic.   02/11/2020, CT-guided biopsy of LLL mass showed squamous cell carcinoma.  Underwent 4 sessions of radiation till 03/15/2020 03/10/2020-03/18/2020, patient was admitted for stridor and dyspnea.  He was seen by ENT and recommended tracheostomy which he refused and has since discharged to SNF. 03/19/2020, patient presented to the ED again with dyspnea, started on hypoxia.  Patient was seen by ENT and underwent tracheostomy placement. 03/18/2020-oncology Dr. Benay Spice recommended hospice. 1/30, PEG tube clogged.  Tube feeding held.  Subjective: Patient was seen and examined this morning. Not in distress.  Remains on IV fluid.  Tube feeding on hold because PEG tube is clogged. Pain controlled on IV pain meds Oxygenation through tracheostomy tube 5 L/min.  Assessment/Plan: Acute hypoxic respiratory failure -Timeline of events as above. Underwent tracheostomy by ENT this admission. -Currently on 4 to 5 L oxygen through trach collar. -Outpatient follow-up with ENT Dr. Wilburn Cornelia ENT for tracheostomy care. -Speech therapy following for Passy-Muir valve. -Continue intermittent suctioning.  Clogged PEG tube -1/30, noted to have a clogged tube.   IR evaluation for declogging requested.  Tube feeding on hold.  In the interval, patient is getting D5 NS at 75 mL/h.  Essential meds changed to IV.  Sepsis, POA  Aspiration pneumonia Radiation pneumonitis -Completed the course of antibiotics.  Laryngeal cancer Squamous cell lung cancer -Seen by oncology and radiation oncology. Underwent radiation for laryngeal cancer and squamous cell lung cancer in the past.   -Per last note from oncology on 1/14, patient is a poor candidate for any systemic chemotherapy. Hospice was recommended. -Continue pain control with fentanyl patch, IV Dilaudid as needed oral oxycodone as needed.  Paroxysmal A. fib, RVR -Rate intermittently not controlled. -Not on any anticoagulation. -Continue current medication.  Hypomagnesemia -Magnesium level was low at 1.6 on 1/29.  IV replacement ordered.  Recheck labs tomorrow. Recent Labs  Lab 03/29/20 0328 04/02/20 0302  K 4.2 4.0  MG 1.6* 1.6*  PHOS 2.9 2.9   COPD -Currently not active. -Continue inhalers.  Severe protein calorie malnutrition PEG tube status -Body mass index is 8.93 kg/m. -Nutrition consult appreciated.  Resume tube feeding once declogged  Goals of care conversation. -Extensive discussion with family and patient regarding goals of care. -Currently full code. -Patient was interested in home hospice but unable to go home due to poor home condition. -Plan is to discharge him to SNF, treat what is treatable and follow-up and establish care with palliative care.  But he is a new tracheostomy and is a difficult placement.  Mobility: Encourage ambulation. Code Status:   Code Status: Full Code  Nutritional status: Body mass index is 8.93 kg/m. Nutrition Problem: Severe Malnutrition Etiology: chronic illness,cancer and cancer related treatments Signs/Symptoms: severe fat depletion,severe muscle depletion Diet Order  Diet NPO time specified Except for: Ice Chips  Diet  effective now                 DVT prophylaxis: enoxaparin (LOVENOX) injection 40 mg Start: 03/21/20 1600 Place and maintain sequential compression device Start: 03/19/20 0810   Antimicrobials:  None Fluid: None Consultants: ENT Family Communication:  Discussed with patient's sister on 1/28  Status is: Inpatient  Dispo: The patient is from: Home              Anticipated d/c is to: SNF              Anticipated d/c date is: Whenever bed/authorization is available              Patient currently is medically stable to d/c.   Difficult to place patient Yes   Infusions:  . sodium chloride    . dextrose 5 % and 0.9% NaCl 75 mL/hr at 04/04/20 0836  . feeding supplement (JEVITY 1.5 CAL/FIBER) 1,000 mL (04/01/20 1031)    Scheduled Meds: . chlorhexidine  15 mL Mouth Rinse BID  . enoxaparin (LOVENOX) injection  40 mg Subcutaneous Q24H  . feeding supplement (PROSource TF)  45 mL Per Tube BID  . fentaNYL  1 patch Transdermal Q72H  . lipase/protease/amylase)  20,880 Units Per Tube Once   And  . sodium bicarbonate  650 mg Per Tube Once  . mouth rinse  15 mL Mouth Rinse q12n4p  . pantoprazole sodium  40 mg Per Tube q morning - 10a  . polyethylene glycol  17 g Per Tube Daily  . senna  2 tablet Per Tube Daily  . sertraline  50 mg Per Tube Daily  . thiamine  100 mg Per Tube Daily    Antimicrobials: Anti-infectives (From admission, onward)   Start     Dose/Rate Route Frequency Ordered Stop   03/19/20 1213  piperacillin-tazobactam (ZOSYN) 3.375 GM/50ML IVPB       Note to Pharmacy: Estell Harpin   : cabinet override      03/19/20 1213 03/20/20 0014   03/19/20 1000  piperacillin-tazobactam (ZOSYN) IVPB 3.375 g  Status:  Discontinued        3.375 g 12.5 mL/hr over 240 Minutes Intravenous Every 8 hours 03/19/20 0852 03/23/20 1300      PRN meds: HYDROmorphone (DILAUDID) injection, ipratropium-albuterol, LORazepam, oxyCODONE   Objective: Vitals:   04/04/20 0747 04/04/20 0800  BP:     Pulse: 84   Resp: 16   Temp:    SpO2: 97% 97%    Intake/Output Summary (Last 24 hours) at 04/04/2020 1125 Last data filed at 04/04/2020 0600 Gross per 24 hour  Intake 1214.67 ml  Output 750 ml  Net 464.67 ml   Filed Weights   03/30/20 0414 04/02/20 0517 04/03/20 0449  Weight: 24.5 kg 50.8 kg 29 kg   Weight change:  Body mass index is 8.93 kg/m.   Physical Exam: General exam: Pleasant elderly African-American male.  Not in distress Skin: No rashes, lesions or ulcers. HEENT: Getting oxygen through tracheostomy tube anteriorly. Lungs: Clear to auscultation bilaterally CVS: Regular rate and rhythm, no murmur GI/Abd soft, nontender, nondistended, bowel sound present.  PEG tube site intact, but tube is clogged. CNS: Alert, awake, moves lips to answer questions appropriately.  Oriented x3 Psychiatry: Depressed look Extremities: No pedal edema, no calf tenderness  Data Review: I have personally reviewed the laboratory data and studies available.  Recent Labs  Lab 03/29/20 0328 04/02/20 0302  WBC 4.1 4.1  NEUTROABS 2.9 2.7  HGB 9.0* 9.3*  HCT 29.1* 29.0*  MCV 74.8* 74.2*  PLT 280 292   Recent Labs  Lab 03/29/20 0328 04/02/20 0302  NA 134* 133*  K 4.2 4.0  CL 95* 92*  CO2 26 29  GLUCOSE 140* 126*  BUN 16 17  CREATININE 0.45* 0.40*  CALCIUM 9.1 8.9  MG 1.6* 1.6*  PHOS 2.9 2.9    F/u labs ordered  Signed, Terrilee Croak, MD [facility] 04/04/2020

## 2020-04-05 ENCOUNTER — Inpatient Hospital Stay: Payer: Medicaid Other | Admitting: Nurse Practitioner

## 2020-04-05 LAB — BASIC METABOLIC PANEL
Anion gap: 11 (ref 5–15)
BUN: 11 mg/dL (ref 8–23)
CO2: 26 mmol/L (ref 22–32)
Calcium: 8.7 mg/dL — ABNORMAL LOW (ref 8.9–10.3)
Chloride: 102 mmol/L (ref 98–111)
Creatinine, Ser: 0.49 mg/dL — ABNORMAL LOW (ref 0.61–1.24)
GFR, Estimated: >60 mL/min (ref >60)
Glucose, Bld: 134 mg/dL — ABNORMAL HIGH (ref 70–99)
Potassium: 3.7 mmol/L (ref 3.5–5.1)
Sodium: 139 mmol/L (ref 135–145)

## 2020-04-05 LAB — CBC WITH DIFFERENTIAL/PLATELET
Abs Immature Granulocytes: 0.01 10*3/uL (ref 0.00–0.07)
Basophils Absolute: 0 10*3/uL (ref 0.0–0.1)
Basophils Relative: 0 %
Eosinophils Absolute: 0 10*3/uL (ref 0.0–0.5)
Eosinophils Relative: 0 %
HCT: 28.3 % — ABNORMAL LOW (ref 39.0–52.0)
Hemoglobin: 8.8 g/dL — ABNORMAL LOW (ref 13.0–17.0)
Immature Granulocytes: 0 %
Lymphocytes Relative: 12 %
Lymphs Abs: 0.5 10*3/uL — ABNORMAL LOW (ref 0.7–4.0)
MCH: 23.6 pg — ABNORMAL LOW (ref 26.0–34.0)
MCHC: 31.1 g/dL (ref 30.0–36.0)
MCV: 75.9 fL — ABNORMAL LOW (ref 80.0–100.0)
Monocytes Absolute: 0.7 10*3/uL (ref 0.1–1.0)
Monocytes Relative: 17 %
Neutro Abs: 2.9 10*3/uL (ref 1.7–7.7)
Neutrophils Relative %: 71 %
Platelets: 297 10*3/uL (ref 150–400)
RBC: 3.73 MIL/uL — ABNORMAL LOW (ref 4.22–5.81)
RDW: 19.9 % — ABNORMAL HIGH (ref 11.5–15.5)
WBC: 4.1 10*3/uL (ref 4.0–10.5)
nRBC: 0 % (ref 0.0–0.2)

## 2020-04-05 LAB — FERRITIN: Ferritin: 436 ng/mL — ABNORMAL HIGH (ref 24–336)

## 2020-04-05 LAB — MAGNESIUM: Magnesium: 1.5 mg/dL — ABNORMAL LOW (ref 1.7–2.4)

## 2020-04-05 LAB — PHOSPHORUS: Phosphorus: 2.7 mg/dL (ref 2.5–4.6)

## 2020-04-05 LAB — GLUCOSE, CAPILLARY
Glucose-Capillary: 103 mg/dL — ABNORMAL HIGH (ref 70–99)
Glucose-Capillary: 111 mg/dL — ABNORMAL HIGH (ref 70–99)
Glucose-Capillary: 115 mg/dL — ABNORMAL HIGH (ref 70–99)
Glucose-Capillary: 128 mg/dL — ABNORMAL HIGH (ref 70–99)
Glucose-Capillary: 149 mg/dL — ABNORMAL HIGH (ref 70–99)
Glucose-Capillary: 151 mg/dL — ABNORMAL HIGH (ref 70–99)
Glucose-Capillary: 155 mg/dL — ABNORMAL HIGH (ref 70–99)

## 2020-04-05 MED ORDER — FREE WATER
100.0000 mL | Freq: Four times a day (QID) | Status: DC
  Administered 2020-04-05 – 2020-04-09 (×16): 100 mL

## 2020-04-05 MED ORDER — MAGNESIUM SULFATE 4 GM/100ML IV SOLN
4.0000 g | Freq: Once | INTRAVENOUS | Status: AC
  Administered 2020-04-05: 4 g via INTRAVENOUS
  Filled 2020-04-05: qty 100

## 2020-04-05 NOTE — Progress Notes (Signed)
Physical Therapy Treatment Patient Details Name: Devon Peterson MRN: [id number] DOB: 22-Nov-1955 Today's Date: 04/05/2020    History of Present Illness 65 y.o. 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 fib, aspiration pneumonia.  Patient presented to ED on 1/15 with complaint of shortness of breath. Dx of respiratory failure.  Recent admission 1/6-1/14/22 for sepsis 2* PNA.    PT Comments    Pt in bed on 5 lts 28% TRACH at 94%.  General Comments: AxO x 3 following all directions just non verbal due to [facility] but does use a white board to communicate.  Assisted OOB to attempt amb.   General bed mobility comments: pt self able with increased time and use of bed rail.  General transfer comment: assisted OOB with walker with elevated bed and noticably weak Quads but once pt was able to "lock" his knees he presented with increased support.General Gait Details: very limited activity tolerance with poor posture most mosticed was pt was walking on B "tip toes" and had excessive WBing thru B UE's on walker.  Attempted to amb from bed to bathroom however was only able to advance to 3 feet.  Chair pulled from behind . Pt remained on 5 lts 28% TRACH sats decreased to 85% with small amount of coughing.  Assisted with suction. Positioned in recliner to comfort.    Follow Up Recommendations  SNF     Equipment Recommendations  None recommended by PT    Recommendations for Other Services       Precautions / Restrictions Precautions Precautions: Fall Precaution Comments: currently on 5L O2 Trach collar, PEG NPO X ICE chips    Mobility  Bed Mobility   Bed Mobility: Rolling;Supine to Sit Rolling: Min guard   Supine to sit: Min assist     General bed mobility comments: pt self able with increased time and use of bed rail  Transfers Overall transfer level: Needs assistance Equipment used:  Rolling walker (2 wheeled) Transfers: Sit to/from Stand Sit to Stand: Min assist;+2 physical assistance;+2 safety/equipment;Mod assist         General transfer comment: assisted OOB with walker with elevated bed and noticably weak Quads but once pt was able to "lock" his knees he presented with increased support.  Ambulation/Gait   Gait Distance (Feet): 3 Feet Assistive device: Rolling walker (2 wheeled) Gait Pattern/deviations: Step-to pattern;Decreased step length - right;Decreased step length - left;Trunk flexed;Narrow base of support Gait velocity: decreased   General Gait Details: very limited activity tolerance with poor posture most mosticed was pt was walking on B "tip toes" and had excessive WBing thru B UE's on walker.  Attempted to amb from bed to bathroom however was only able to advance to 3 feet.  Chair pulled from behind . Pt remained on 5 lts 28% TRACH sats decreased to 85% with small amount of coughing.  Assisted with suction.   Stairs             Wheelchair Mobility    Modified Rankin (Stroke Patients Only)       Balance                                            Cognition Arousal/Alertness: Awake/alert Behavior During Therapy: WFL for tasks assessed/performed Overall Cognitive Status: Within Functional Limits  for tasks assessed                                 General Comments: AxO x 3 following all directions just non verbal due to [facility] but does use a white board to communicate      Exercises      General Comments        Pertinent Vitals/Pain Pain Assessment: No/denies pain    Home Living                      Prior Function            PT Goals (current goals can now be found in the care plan section) Progress towards PT goals: Progressing toward goals    Frequency    Min 2X/week      PT Plan Current plan remains appropriate    Co-evaluation              AM-PAC PT "6  Clicks" Mobility   Outcome Measure  Help needed turning from your back to your side while in a flat bed without using bedrails?: A Little Help needed moving from lying on your back to sitting on the side of a flat bed without using bedrails?: A Little Help needed moving to and from a bed to a chair (including a wheelchair)?: A Lot Help needed standing up from a chair using your arms (e.g., wheelchair or bedside chair)?: A Lot Help needed to walk in hospital room?: A Lot Help needed climbing 3-5 steps with a railing? : Total 6 Click Score: 13    End of Session Equipment Utilized During Treatment: Oxygen Activity Tolerance: Patient limited by fatigue Patient left: in chair;with call bell/phone within reach Nurse Communication: Mobility status PT Visit Diagnosis: Other abnormalities of gait and mobility (R26.89)     Time: 1517-1600 PT Time Calculation (min) (ACUTE ONLY): 43 min  Charges:  $Gait Training: 8-22 mins $Therapeutic Activity: 23-37 mins                     Rica Koyanagi  PTA Acute  Rehabilitation Services Pager      [phone] Office      [phone]

## 2020-04-05 NOTE — Progress Notes (Addendum)
PROGRESS NOTE  Devon Peterson  DOB: April 09, 1955  PCP: Patient, No Pcp Per NOI:[id number]  DOA: 03/19/2020  LOS: 17 days   Chief Complaint  Patient presents with  . Respiratory Distress   Brief narrative: Devon Peterson is a 65 y.o. 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 fib, aspiration pneumonia.  Patient presented to ED on 1/15 with complaint of shortness of breath.  May 2021, diagnosed with laryngeal cancer, underwent tracheostomy. 07/23/2019 -08/21/2019 , patient underwent radiation to larynx.   08/26/2019, tracheostomy was decannulated. Also in May 2021, CT scan of chest showed 2.7 cm mass in the left lower lobe.  Bronchoscopy was nondiagnostic.   02/11/2020, CT-guided biopsy of LLL mass showed squamous cell carcinoma.  Underwent 4 sessions of radiation till 03/15/2020 03/10/2020-03/18/2020, patient was admitted for stridor and dyspnea.  He was seen by ENT and recommended tracheostomy which he refused and has since discharged to SNF. 03/19/2020, patient presented to the ED again with dyspnea, started on hypoxia.  Patient was seen by ENT and underwent tracheostomy placement. 03/18/2020-oncology Dr. Benay Spice recommended hospice. 1/30, PEG tube clogged.  Tube feeding held. 1/31, PEG tube replaced by IR.  Tube feeding resumed.    Currently pending placement.  Difficult disposition because of new tracheostomy status.  Subjective: Patient was seen and examined this morning. Lying on bed.  Not in distress.  No new symptoms.  PEG tube was replaced yesterday.  Feeding resumed.  Assessment/Plan: Acute hypoxic respiratory failure -Timeline of events as above. Underwent tracheostomy by ENT this admission. -Currently on 4 to 5 L oxygen through trach collar. -Outpatient follow-up with ENT Dr. Wilburn Cornelia ENT for tracheostomy care. -Speech therapy following for Passy-Muir valve. -Continue  intermittent suctioning.  Clogged PEG tube -1/30, noted to have a clogged tube.  It was replaced by IR the next day 1/31.  Tube feeding resumed.  Sepsis, POA  Aspiration pneumonia Radiation pneumonitis -Completed the course of antibiotics.  Laryngeal cancer Squamous cell lung cancer -Seen by oncology and radiation oncology. Underwent radiation for laryngeal cancer and squamous cell lung cancer in the past.   -Per last note from oncology on 1/14, patient is a poor candidate for any systemic chemotherapy. Hospice was recommended. -Continue pain control with fentanyl patch, IV Dilaudid as needed oral oxycodone as needed.  Paroxysmal A. fib, RVR -Rate intermittently not controlled. -Not on any anticoagulation. -Continue current medication.  Chronic anemia -No active bleeding but gradually downtrending hemoglobin level.  Hemoglobin 8.8 today.  Obtain ferritin level today. Recent Labs    07/09/19 0438 07/10/19 0237 02/01/20 1307 02/11/20 0834 03/24/20 0337 03/27/20 0304 03/29/20 0328 04/02/20 0302 04/05/20 0247 04/05/20 0924  HGB 11.1*   < >  --    < > 9.2* 9.3* 9.0* 9.3* 8.8*  --   MCV 88.8   < >  --    < > 73.5* 74.0* 74.8* 74.2* 75.9*  --   VITAMINB12 281  --   --   --   --   --   --   --   --   --   FOLATE 11.5  --   --   --   --   --   --   --   --   --   FERRITIN 421*  --  158  --   --   --   --   --   --  436*  TIBC  160*  --   --   --   --   --   --   --   --   --   IRON 30*  --   --   --   --   --   --   --   --   --   RETICCTPCT 0.9  --   --   --   --   --   --   --   --   --    < > = values in this interval not displayed.   Hypomagnesemia -Magnesium level is low at 1.5 this morning.  IV replacement given.  Recent Labs  Lab 04/02/20 0302 04/05/20 0247  K 4.0 3.7  MG 1.6* 1.5*  PHOS 2.9 2.7   COPD -Currently not active. -Continue inhalers.  Severe protein calorie malnutrition PEG tube status -Body mass index is 11.44 kg/m. -Nutrition consult  appreciated.  Resume tube feeding once declogged  Goals of care conversation. -Extensive discussion with family and patient regarding goals of care. -Currently full code. -Patient was interested in home hospice but unable to go home due to poor home condition. -Plan is to discharge him to SNF, treat what is treatable and follow-up and establish care with palliative care.  But he is a new tracheostomy and is a difficult placement.  Mobility: Encourage ambulation. Code Status:   Code Status: Full Code  Nutritional status: Body mass index is 11.44 kg/m. Nutrition Problem: Severe Malnutrition Etiology: chronic illness,cancer and cancer related treatments Signs/Symptoms: severe fat depletion,severe muscle depletion Diet Order            Diet NPO time specified Except for: Ice Chips  Diet effective now                 DVT prophylaxis: enoxaparin (LOVENOX) injection 40 mg Start: 03/21/20 1600 Place and maintain sequential compression device Start: 03/19/20 0810   Antimicrobials:  None Fluid: None Consultants: ENT Family Communication:  Discussed with patient's sister on 1/28  Status is: Inpatient  Dispo: The patient is from: Home              Anticipated d/c is to: SNF              Anticipated d/c date is: Whenever bed/authorization is available.                Patient currently is medically stable to d/c.   Difficult to place patient Yes because of new tracheostomy status   Infusions:  . sodium chloride    . dextrose 5 % and 0.9% NaCl 75 mL/hr at 04/05/20 0857  . feeding supplement (JEVITY 1.5 CAL/FIBER) 1,000 mL (04/04/20 1659)    Scheduled Meds: . chlorhexidine  15 mL Mouth Rinse BID  . enoxaparin (LOVENOX) injection  40 mg Subcutaneous Q24H  . feeding supplement (PROSource TF)  45 mL Per Tube BID  . fentaNYL  1 patch Transdermal Q72H  . free water  100 mL Per Tube Q6H  . lipase/protease/amylase)  20,880 Units Per Tube Once   And  . sodium bicarbonate  650 mg Per  Tube Once  . mouth rinse  15 mL Mouth Rinse q12n4p  . pantoprazole sodium  40 mg Per Tube q morning - 10a  . polyethylene glycol  17 g Per Tube Daily  . senna  2 tablet Per Tube Daily  . sertraline  50 mg Per Tube Daily  . thiamine  100 mg Per Tube  Daily    Antimicrobials: Anti-infectives (From admission, onward)   Start     Dose/Rate Route Frequency Ordered Stop   03/19/20 1213  piperacillin-tazobactam (ZOSYN) 3.375 GM/50ML IVPB       Note to Pharmacy: Estell Harpin   : cabinet override      03/19/20 1213 03/20/20 0014   03/19/20 1000  piperacillin-tazobactam (ZOSYN) IVPB 3.375 g  Status:  Discontinued        3.375 g 12.5 mL/hr over 240 Minutes Intravenous Every 8 hours 03/19/20 0852 03/23/20 1300      PRN meds: HYDROmorphone (DILAUDID) injection, ipratropium-albuterol, LORazepam, oxyCODONE   Objective: Vitals:   04/05/20 0507 04/05/20 0911  BP: 101/69   Pulse: 98 93  Resp: 17 16  Temp: 98.7 F (37.1 C)   SpO2: 96% 100%    Intake/Output Summary (Last 24 hours) at 04/05/2020 1318 Last data filed at 04/05/2020 0900 Gross per 24 hour  Intake 1648.73 ml  Output 800 ml  Net 848.73 ml   Filed Weights   04/02/20 0517 04/03/20 0449 04/05/20 0556  Weight: 50.8 kg 29 kg 37.2 kg   Weight change:  Body mass index is 11.44 kg/m.   Physical Exam: General exam: Pleasant elderly African-American male.  Not in distress Skin: No rashes, lesions or ulcers. HEENT: Getting oxygen through tracheostomy tube anteriorly. Lungs: Clear to auscultation bilaterally CVS: Regular rate and rhythm, no murmur GI/Abd soft, nontender, nondistended, bowel sound present.  PEG tube site intact, but tube is clogged. CNS: Alert, awake, moves lips to answer questions appropriately.  Oriented x3 Psychiatry: Depressed look Extremities: No pedal edema, no calf tenderness  Data Review: I have personally reviewed the laboratory data and studies available.  Recent Labs  Lab 04/02/20 0302  04/05/20 0247  WBC 4.1 4.1  NEUTROABS 2.7 2.9  HGB 9.3* 8.8*  HCT 29.0* 28.3*  MCV 74.2* 75.9*  PLT 292 297   Recent Labs  Lab 04/02/20 0302 04/05/20 0247  NA 133* 139  K 4.0 3.7  CL 92* 102  CO2 29 26  GLUCOSE 126* 134*  BUN 17 11  CREATININE 0.40* 0.49*  CALCIUM 8.9 8.7*  MG 1.6* 1.5*  PHOS 2.9 2.7    F/u labs ordered  Signed, Terrilee Croak, MD [facility] 04/05/2020

## 2020-04-05 NOTE — Progress Notes (Signed)
Nutrition Follow-up  DOCUMENTATION CODES:   Underweight,Severe malnutrition in context of chronic illness  INTERVENTION:  - once PEG is de-clogged, resume Jevity 1.5 @ 60 with 45 ml Prosource TF BID. - will order 100 ml free water QID to add 400 ml free water/day.   NUTRITION DIAGNOSIS:   Severe Malnutrition related to chronic illness,cancer and cancer related treatments as evidenced by severe fat depletion,severe muscle depletion. -ongoing  GOAL:   Patient will meet greater than or equal to 90% of their needs -met with TF regimen  MONITOR:   Labs,Weight trends,I & O's,TF tolerance  ASSESSMENT:   65 yo male former smoker with laryngeal cancer (poorly differentiated squamous cell) presented to ER with dyspnea, stridor and hypoxia.  Weight has been fluctuating frequently throughout admission (admit: 1/17); question if all recorded weights are accurate given how vastly different many of them are. No information documented in the edema section of flow sheet.   Patient remains NPO. PEG in place but MD note yesterday indicates that PEG had been clogged since 1/30 and that IR was consulted for de-clogging. Due to this, TF was on hold.   Orders in place for Jevity 1.5 @ 60 ml/hr with 45 ml Prosource TF BID. This regimen provides 2240 kcal, 114 grams protein, and 1093 ml free water.   Per notes: - trach collar--difficult placement d/t this - aspiration PNA and radiation pneumonitis now s/p abx course - laryngeal cancer, squamous cell lung cancer--Onc stating patient is a poor candidate for systemic chemo - severe PCM - remains Full Code    Labs reviewed; CBGs: 149, 115, 155, 103 mg/dl, creatinine: 0.49 mg/dl, Ca: 8.7 mg/dl, Mg: 1.5 mg/dl.  Medications reviewed; 4 g IV Mg sulfate x1 run 2/1, 40 mg protonix/day, 17 g miralax/day, 2 tablets senokot/day, 100 mg thiamine/day.  IVF; D5-NS @ 75 ml/hr (306 kcal/24 hours).    Diet Order:   Diet Order            Diet NPO time specified  Except for: Ice Chips  Diet effective now                 EDUCATION NEEDS:   No education needs have been identified at this time  Skin:  Skin Assessment: Skin Integrity Issues: Skin Integrity Issues:: Incisions Incisions: closed neck s/p trach  Last BM:  1/30  Height:   Ht Readings from Last 1 Encounters:  03/19/20 $RemoveB'5\' 11"'kwhcWItN$  (1.803 m)    Weight:   Wt Readings from Last 1 Encounters:  04/05/20 37.2 kg     Estimated Nutritional Needs:  Kcal:  2000-2200 Protein:  110-125 grams Fluid:  > 2 L     Jarome Matin, MS, RD, LDN, CNSC Inpatient Clinical Dietitian RD pager # available in [place]  After hours/weekend pager # available in [facility]

## 2020-04-06 DIAGNOSIS — J9621 Acute and chronic respiratory failure with hypoxia: Secondary | ICD-10-CM | POA: Diagnosis not present

## 2020-04-06 LAB — GLUCOSE, CAPILLARY
Glucose-Capillary: 111 mg/dL — ABNORMAL HIGH (ref 70–99)
Glucose-Capillary: 141 mg/dL — ABNORMAL HIGH (ref 70–99)
Glucose-Capillary: 134 mg/dL — ABNORMAL HIGH (ref 70–99)
Glucose-Capillary: 93 mg/dL (ref 70–99)
Glucose-Capillary: 125 mg/dL — ABNORMAL HIGH (ref 70–99)

## 2020-04-06 NOTE — Plan of Care (Signed)
  Problem: Health Behavior/Discharge Planning: Goal: Ability to manage health-related needs will improve Outcome: Progressing   Problem: Clinical Measurements: Goal: Ability to maintain clinical measurements within normal limits will improve Outcome: Progressing Goal: Will remain free from infection Outcome: Progressing Goal: Diagnostic test results will improve Outcome: Progressing   Problem: Activity: Goal: Risk for activity intolerance will decrease Outcome: Progressing   Problem: Nutrition: Goal: Adequate nutrition will be maintained Outcome: Progressing   Problem: Skin Integrity: Goal: Risk for impaired skin integrity will decrease Outcome: Progressing   Problem: Education: Goal: Knowledge about tracheostomy care/management will improve Outcome: Progressing   Problem: Activity: Goal: Ability to tolerate increased activity will improve Outcome: Progressing   Problem: Health Behavior/Discharge Planning: Goal: Ability to manage tracheostomy will improve Outcome: Progressing   Problem: Respiratory: Goal: Patent airway maintenance will improve Outcome: Progressing   Problem: Role Relationship: Goal: Ability to communicate will improve Outcome: Progressing

## 2020-04-06 NOTE — Progress Notes (Signed)
[facility] PROGRESS NOTE    Devon Peterson  [id number] DOB: 01-Mar-1956 DOA: 03/19/2020 PCP: Patient, No Pcp Per      Brief Narrative:  Devon Peterson is a 65 y.o. M with COPD, laryngeal cancer status post radiation therapy, squamous cell cancer of the lung also status post radiation therapy and atrial fibrillation who presented with shortness of breath.  Please see longer summary by Dr. Pietro Cassis from 2/1 but in short, patient with off and on worsening stridor and dyspnea, ultimately readmitted 1/15 and had new tracheostomy placement.       Assessment & Plan:  Acute hypoxic respiratory failure in the setting of laryngeal cancer status post radiation to the neck, now with new tracheostomy -Speech therapy -Respiratory therapy -Outpatient follow-up with ENT Dr. Nechama Guard.    Dysphagia PEG dependent.  PEG recently replaced 1/31. -Continue tube feeds  Laryngeal cancer Squamous cell cancer of the lung Patient is not a candidate for systemic chemotherapy. -Hospice referral -Continue fentanyl patch, oxycodone as needed -Continue sertraline  Paroxysmal atrial fibrillation Not candidate for anticoagulation  COPD No active disease  Severe protein calorie malnutrition As evidenced by chronic illness, BMI 11 inability to take oral intake and PEG dependence  Anemia of chronic disease  Hypomagnesemia Has been repleted -Repeat magnesium again tomorrow       Disposition: Status is: Inpatient  Remains inpatient appropriate because:Unsafe d/c plan   Dispo: The patient is from: Home              Anticipated d/c is to: SNF              Anticipated d/c date is: > 3 days              Patient currently is medically stable to d/c.   Difficult to place patient Yes       Level of care: Med-Surg       MDM: The below labs and imaging reports were reviewed and summarized above.  Medication management as above.    DVT prophylaxis: enoxaparin (LOVENOX)  injection 40 mg Start: 03/21/20 1600 Place and maintain sequential compression device Start: 03/19/20 0810  Code Status: FULL Family Communication:            Subjective: Patient's tracheostomy is uncomfortable, he has generalized discomfort in the neck, he has frequent secretions in his trach.  No headache, fever, confusion, chest pain, dyspnea, abdominal pain, vomiting.  Objective: Vitals:   04/06/20 0459 04/06/20 0801 04/06/20 1349 04/06/20 2000  BP: 93/73  94/66   Pulse: 86  92 90  Resp: 15  20 16   Temp: 98.8 F (37.1 C)  98.2 F (36.8 C)   TempSrc: Oral  Oral   SpO2: 100% 99% 100% 99%  Weight:      Height:        Intake/Output Summary (Last 24 hours) at 04/06/2020 2122 Last data filed at 04/06/2020 1615 Gross per 24 hour  Intake 3179 ml  Output 850 ml  Net 2329 ml   Filed Weights   04/02/20 0517 04/03/20 0449 04/05/20 0556  Weight: 50.8 kg 29 kg 37.2 kg    Examination: General appearance: Very thin, cachectic, chronically ill-appearing adult male, alert and in no obvious distress.  Appears listless and tired. HEENT: Anicteric, conjunctiva pink, lids and lashes normal. No nasal deformity, discharge, epistaxis.  Lips moist edentulous, oropharynx tacky dry, no oral lesions, scant frothy white secretions in his tracheostomy.   Skin: Warm and dry.  No jaundice.  No suspicious rashes or lesions. Cardiac: RRR, nl S1-S2, no murmurs appreciated.  Capillary refill is brisk.  JVP not visible.  No LE edema.  Radial pulses 2+ and symmetric. Respiratory: Normal respiratory rate and rhythm.  CTAB without rales or wheezes. Abdomen: Abdomen soft.  No TTP or guarding PEG tube is in place. No ascites, distension, hepatosplenomegaly.   MSK: No deformities or effusions. Neuro: Awake and alert.  EOMI, moves all extremities with severe generalized weakness. Speech fluent.    Psych: Sensorium intact and responding to questions, attention normal. Affect flat.  Judgment and insight appear  normal.    Data Reviewed: I have personally reviewed following labs and imaging studies:  CBC: Recent Labs  Lab 04/02/20 0302 04/05/20 0247  WBC 4.1 4.1  NEUTROABS 2.7 2.9  HGB 9.3* 8.8*  HCT 29.0* 28.3*  MCV 74.2* 75.9*  PLT 292 341   Basic Metabolic Panel: Recent Labs  Lab 04/02/20 0302 04/05/20 0247  NA 133* 139  K 4.0 3.7  CL 92* 102  CO2 29 26  GLUCOSE 126* 134*  BUN 17 11  CREATININE 0.40* 0.49*  CALCIUM 8.9 8.7*  MG 1.6* 1.5*  PHOS 2.9 2.7   GFR: Estimated Creatinine Clearance: 49.1 mL/min (A) (by C-G formula based on SCr of 0.49 mg/dL (L)). Liver Function Tests: Recent Labs  Lab 04/02/20 0302  AST 17  ALT 16  ALKPHOS 81  BILITOT 0.2*  PROT 7.0  ALBUMIN 2.8*   No results for input(s): LIPASE, AMYLASE in the last 168 hours. No results for input(s): AMMONIA in the last 168 hours. Coagulation Profile: No results for input(s): INR, PROTIME in the last 168 hours. Cardiac Enzymes: No results for input(s): CKTOTAL, CKMB, CKMBINDEX, TROPONINI in the last 168 hours. BNP (last 3 results) No results for input(s): PROBNP in the last 8760 hours. HbA1C: No results for input(s): HGBA1C in the last 72 hours. CBG: Recent Labs  Lab 04/06/20 0408 04/06/20 0810 04/06/20 1208 04/06/20 1641 04/06/20 2002  GLUCAP 111* 141* 134* 93 125*   Lipid Profile: No results for input(s): CHOL, HDL, LDLCALC, TRIG, CHOLHDL, LDLDIRECT in the last 72 hours. Thyroid Function Tests: No results for input(s): TSH, T4TOTAL, FREET4, T3FREE, THYROIDAB in the last 72 hours. Anemia Panel: Recent Labs    04/05/20 0924  FERRITIN 436*   Urine analysis:    Component Value Date/Time   COLORURINE YELLOW 03/10/2020 1400   APPEARANCEUR HAZY (A) 03/10/2020 1400   LABSPEC 1.018 03/10/2020 1400   PHURINE 6.0 03/10/2020 1400   GLUCOSEU NEGATIVE 03/10/2020 1400   HGBUR NEGATIVE 03/10/2020 1400   BILIRUBINUR NEGATIVE 03/10/2020 1400   KETONESUR NEGATIVE 03/10/2020 1400   PROTEINUR  NEGATIVE 03/10/2020 1400   UROBILINOGEN 1.0 10/28/2008 0458   NITRITE POSITIVE (A) 03/10/2020 1400   LEUKOCYTESUR TRACE (A) 03/10/2020 1400   Sepsis Labs: @LABRCNTIP (procalcitonin:4,lacticacidven:4)  )No results found for this or any previous visit (from the past 240 hour(s)).       Radiology Studies: No results found.      Scheduled Meds: . chlorhexidine  15 mL Mouth Rinse BID  . enoxaparin (LOVENOX) injection  40 mg Subcutaneous Q24H  . feeding supplement (PROSource TF)  45 mL Per Tube BID  . fentaNYL  1 patch Transdermal Q72H  . free water  100 mL Per Tube Q6H  . mouth rinse  15 mL Mouth Rinse q12n4p  . pantoprazole sodium  40 mg Per Tube q morning - 10a  . polyethylene glycol  17 g Per Tube  Daily  . senna  2 tablet Per Tube Daily  . sertraline  50 mg Per Tube Daily  . thiamine  100 mg Per Tube Daily   Continuous Infusions: . sodium chloride    . feeding supplement (JEVITY 1.5 CAL/FIBER) 1,000 mL (04/04/20 1659)     LOS: 18 days    Time spent: 25 minutes    Edwin Dada, MD [facility] 04/06/2020, 9:22 PM     Please page though [place] or Epic secure chat:  For [employer], Adult nurse

## 2020-04-07 DIAGNOSIS — Z7189 Other specified counseling: Secondary | ICD-10-CM

## 2020-04-07 DIAGNOSIS — J9621 Acute and chronic respiratory failure with hypoxia: Secondary | ICD-10-CM | POA: Diagnosis not present

## 2020-04-07 LAB — CBC
HCT: 26.4 % — ABNORMAL LOW (ref 39.0–52.0)
Hemoglobin: 8 g/dL — ABNORMAL LOW (ref 13.0–17.0)
MCH: 23.3 pg — ABNORMAL LOW (ref 26.0–34.0)
MCHC: 30.3 g/dL (ref 30.0–36.0)
MCV: 77 fL — ABNORMAL LOW (ref 80.0–100.0)
Platelets: 304 10*3/uL (ref 150–400)
RBC: 3.43 MIL/uL — ABNORMAL LOW (ref 4.22–5.81)
RDW: 19.8 % — ABNORMAL HIGH (ref 11.5–15.5)
WBC: 4.6 10*3/uL (ref 4.0–10.5)
nRBC: 0 % (ref 0.0–0.2)

## 2020-04-07 LAB — GLUCOSE, CAPILLARY
Glucose-Capillary: 108 mg/dL — ABNORMAL HIGH (ref 70–99)
Glucose-Capillary: 117 mg/dL — ABNORMAL HIGH (ref 70–99)
Glucose-Capillary: 117 mg/dL — ABNORMAL HIGH (ref 70–99)
Glucose-Capillary: 119 mg/dL — ABNORMAL HIGH (ref 70–99)
Glucose-Capillary: 127 mg/dL — ABNORMAL HIGH (ref 70–99)
Glucose-Capillary: 99 mg/dL (ref 70–99)

## 2020-04-07 LAB — MAGNESIUM: Magnesium: 1.3 mg/dL — ABNORMAL LOW (ref 1.7–2.4)

## 2020-04-07 MED ORDER — MAGNESIUM SULFATE 2 GM/50ML IV SOLN
2.0000 g | Freq: Once | INTRAVENOUS | Status: AC
  Administered 2020-04-07: 2 g via INTRAVENOUS
  Filled 2020-04-07: qty 50

## 2020-04-07 MED ORDER — MAGNESIUM OXIDE 400 (241.3 MG) MG PO TABS
800.0000 mg | ORAL_TABLET | Freq: Every day | ORAL | Status: DC
  Administered 2020-04-07 – 2020-04-21 (×15): 800 mg
  Filled 2020-04-07 (×15): qty 2

## 2020-04-07 NOTE — Assessment & Plan Note (Signed)
-   s/p trach placement

## 2020-04-07 NOTE — Assessment & Plan Note (Signed)
-   s/p abx course

## 2020-04-07 NOTE — Assessment & Plan Note (Signed)
-   continue inhalers

## 2020-04-07 NOTE — Assessment & Plan Note (Signed)
1/30, noted to have a clogged tube.  It was replaced by IR the next day 1/31.  Tube feeding resumed

## 2020-04-07 NOTE — Assessment & Plan Note (Signed)
-   abx course completed

## 2020-04-07 NOTE — Assessment & Plan Note (Signed)
Timeline of events as above. Underwent tracheostomy by ENT this admission. -Currently on 4 to 5 L oxygen through trach collar. -Outpatient follow-up with ENT Dr. Wilburn Cornelia ENT for tracheostomy care. -Speech therapy following forPassy-Muir valve. -Continue intermittent suctioning.

## 2020-04-07 NOTE — Progress Notes (Signed)
[facility])  This patient has been referred hospices services following discharge.  [facility] will continue to follow for any discharge planning needs and to coordinate admission onto hospice care.    Thank you for the opportunity to participate in this patient's care.     Domenic Moras, BSN, RN [facility] Liaison   [phone] (24h on call)

## 2020-04-07 NOTE — Hospital Course (Signed)
Devon Viernes is a 65 y.o. 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 fib, aspiration pneumonia.  Patient presented to ED on 1/15 with complaint of shortness of breath.   May 2021, diagnosed with laryngeal cancer, underwent tracheostomy. 07/23/2019 -08/21/2019 , patient underwent radiation to larynx.   08/26/2019, tracheostomy was decannulated. Also in May 2021, CT scan of chest showed 2.7 cm mass in the left lower lobe.  Bronchoscopy was nondiagnostic.   02/11/2020, CT-guided biopsy of LLL mass showed squamous cell carcinoma.  Underwent 4 sessions of radiation till 03/15/2020 03/10/2020-03/18/2020, patient was admitted for stridor and dyspnea.  He was seen by ENT and recommended tracheostomy which he refused and has since discharged to SNF. 03/19/2020, patient presented to the ED again with dyspnea, started on hypoxia.  Patient was seen by ENT and underwent tracheostomy placement. 03/18/2020-oncology Dr. Benay Spice recommended hospice. 1/30, PEG tube clogged.  Tube feeding held. 1/31, PEG tube replaced by IR.  Tube feeding resumed.     Currently pending placement.  Difficult disposition because of new tracheostomy status.

## 2020-04-07 NOTE — Assessment & Plan Note (Signed)
Extensive discussion with family and patient regarding goals of care. -Currently full code. -Patient was interested in home hospice but unable to go home due to poor home condition. -Plan is to discharge him to SNF, treat what is treatable and follow-up and establish care with palliative care.  But he is a new tracheostomy and is a difficult placement.

## 2020-04-07 NOTE — Assessment & Plan Note (Signed)
-  Seen by oncology and radiation oncology. Underwent radiation for laryngeal cancer and squamous cell lung cancer in the past.   -Per last note from oncology on 1/14, patient is a poor candidate for any systemic chemotherapy. Hospice was recommended. -Continue pain control with fentanyl patch, IV Dilaudid as needed oral oxycodone as needed.

## 2020-04-07 NOTE — Assessment & Plan Note (Signed)
-  Rate intermittently not controlled. -Not on any anticoagulation. -Continue current medication.

## 2020-04-07 NOTE — Assessment & Plan Note (Signed)
-  No active bleeding but gradually downtrending hemoglobin level.  - intermittent monitoring

## 2020-04-07 NOTE — Assessment & Plan Note (Signed)
-   due to aspiration pna on admission - now s/p abx course

## 2020-04-07 NOTE — Progress Notes (Signed)
PROGRESS NOTE    Devon Peterson   [id number]  DOB: [birth year]  DOA: 03/19/2020     19  PCP: Patient, No Pcp Per  CC: SOB  Hospital Course: Devon Peterson is a 65 y.o. 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 fib, aspiration pneumonia.  Patient presented to ED on 1/15 with complaint of shortness of breath.   May 2021, diagnosed with laryngeal cancer, underwent tracheostomy. 07/23/2019 -08/21/2019 , patient underwent radiation to larynx.   08/26/2019, tracheostomy was decannulated. Also in May 2021, CT scan of chest showed 2.7 cm mass in the left lower lobe.  Bronchoscopy was nondiagnostic.   02/11/2020, CT-guided biopsy of LLL mass showed squamous cell carcinoma.  Underwent 4 sessions of radiation till 03/15/2020 03/10/2020-03/18/2020, patient was admitted for stridor and dyspnea.  He was seen by ENT and recommended tracheostomy which he refused and has since discharged to SNF. 03/19/2020, patient presented to the ED again with dyspnea, started on hypoxia.  Patient was seen by ENT and underwent tracheostomy placement. 03/18/2020-oncology Dr. Benay Spice recommended hospice. 1/30, PEG tube clogged.  Tube feeding held. 1/31, PEG tube replaced by IR.  Tube feeding resumed.     Currently pending placement.  Difficult disposition because of new tracheostomy status.   Interval History:  No events overnight.  Hospital day 19 currently.  Met patient for the first time today; he was resting in bed nonverbal with a dry erase board next to him for communication.  He nodded his head no when asked if he was in any pain or having trouble breathing.  He was otherwise comfortable and had no further questions when prompted. He seems to understand the plan of waiting to go back to his nursing facility after completing 30 days with his trach in place.  He is from [facility]  Old records reviewed in  assessment of this patient  ROS: Constitutional: negative for chills and fevers, Respiratory: negative for cough, Cardiovascular: negative for chest pain and Gastrointestinal: negative for abdominal pain  Assessment & Plan: * Acute on chronic respiratory failure with hypoxia (HCC) Timeline of events as above. Underwent tracheostomy by ENT this admission. -Currently on 4 to 5 L oxygen through trach collar. -Outpatient follow-up with ENT Dr. Wilburn Cornelia ENT for tracheostomy care. -Speech therapy following forPassy-Muir valve. -Continue intermittent suctioning.  Goals of care, counseling/discussion Extensive discussion with family and patient regarding goals of care. -Currently full code. -Patient was interested in home hospice but unable to go home due to poor home condition. -Plan is to discharge him to SNF, treat what is treatable and follow-up and establish care with palliative care.  But he is a new tracheostomy and is a difficult placement.  Malignant neoplasm of lower lobe of left lung ([place]) -Seen by oncology and radiation oncology. Underwent radiation for laryngeal cancer and squamous cell lung cancer in the past.   -Per last note from oncology on 1/14, patient is a poor candidate for any systemic chemotherapy. Hospice was recommended. -Continue pain control with fentanyl patch, IV Dilaudid as needed oral oxycodone as needed.  Sepsis ([place]) - due to aspiration pna on admission - now s/p abx course  Severe protein-calorie malnutrition ([place]) - Patient's BMI is Body mass index is 9.34 kg/m.. - Patient has the following signs/symptoms consistent with PCM: (fat loss, muscle loss, muscle wasting, cachexia). - seen by RD, appreciate assistance. Continue plan per RD to include ongoing TF  and we will continue electrolyte replacement as needed  Aspiration pneumonia (HCC)-resolved as of 04/07/2020 - abx course completed  Normocytic anemia -No active bleeding but gradually downtrending  hemoglobin level.  - intermittent monitoring   PAF (paroxysmal atrial fibrillation) (HCC) -Rate intermittently not controlled. -Not on any anticoagulation. -Continue current medication.  PEG tube malfunction (HCC) 1/30, noted to have a clogged tube.  It was replaced by IR the next day 1/31.  Tube feeding resumed  COPD (chronic obstructive pulmonary disease) (HCC) - continue inhalers  Radiation pneumonitis (HCC)-resolved as of 04/07/2020 - s/p abx course  Laryngeal stridor-resolved as of 04/07/2020 - s/p trach placement    Antimicrobials:   DVT prophylaxis: enoxaparin (LOVENOX) injection 40 mg Start: 03/21/20 1600 Place and maintain sequential compression device Start: 03/19/20 0810   Code Status:   Code Status: Full Code Family Communication: none present  Disposition Plan: Status is: Inpatient  Remains inpatient appropriate because:Unsafe d/c plan and Inpatient level of care appropriate due to severity of illness   Dispo: The patient is from: [facility]              Anticipated d/c is to: [place]              Anticipated d/c date is: > 3 days              Patient currently is not medically stable to d/c.   Difficult to place patient Yes  Risk of unplanned readmission score: Unplanned Admission- Pilot do not use: 36.81   Objective: Blood pressure 103/67, pulse 91, temperature 98.6 F (37 C), temperature source Oral, resp. rate 16, height 5\' 11"  (1.803 m), weight 30.4 kg, SpO2 97 %.  Examination: General appearance: alert and no distress Head: Normocephalic, without obvious abnormality, atraumatic Eyes: EOMI Lungs: clear to auscultation bilaterally Heart: regular rate and rhythm and S1, S2 normal Abdomen: PEG in place. NT, ND, BS present Extremities: no edema, thin Skin: diffuse xerosis Neurologic: follows commands, moves all 4 extremities  Consultants:     Procedures:     Data Reviewed: I have personally reviewed following labs and imaging  studies Results for orders placed or performed during the hospital encounter of 03/19/20 (from the past 24 hour(s))  Glucose, capillary     Status: None   Collection Time: 04/06/20  4:41 PM  Result Value Ref Range   Glucose-Capillary 93 70 - 99 mg/dL  Glucose, capillary     Status: Abnormal   Collection Time: 04/06/20  8:02 PM  Result Value Ref Range   Glucose-Capillary 125 (H) 70 - 99 mg/dL  Glucose, capillary     Status: Abnormal   Collection Time: 04/07/20 12:35 AM  Result Value Ref Range   Glucose-Capillary 117 (H) 70 - 99 mg/dL  CBC     Status: Abnormal   Collection Time: 04/07/20  3:11 AM  Result Value Ref Range   WBC 4.6 4.0 - 10.5 K/uL   RBC 3.43 (L) 4.22 - 5.81 MIL/uL   Hemoglobin 8.0 (L) 13.0 - 17.0 g/dL   HCT 06/05/20 (L) 97.2 - 81.8 %   MCV 77.0 (L) 80.0 - 100.0 fL   MCH 23.3 (L) 26.0 - 34.0 pg   MCHC 30.3 30.0 - 36.0 g/dL   RDW 79.7 (H) 66.6 - 20.4 %   Platelets 304 150 - 400 K/uL   nRBC 0.0 0.0 - 0.2 %  Magnesium     Status: Abnormal   Collection Time: 04/07/20  3:11 AM  Result  Value Ref Range   Magnesium 1.3 (L) 1.7 - 2.4 mg/dL  Glucose, capillary     Status: None   Collection Time: 04/07/20  4:25 AM  Result Value Ref Range   Glucose-Capillary 99 70 - 99 mg/dL  Glucose, capillary     Status: Abnormal   Collection Time: 04/07/20  7:57 AM  Result Value Ref Range   Glucose-Capillary 119 (H) 70 - 99 mg/dL  Glucose, capillary     Status: Abnormal   Collection Time: 04/07/20 12:14 PM  Result Value Ref Range   Glucose-Capillary 108 (H) 70 - 99 mg/dL    No results found for this or any previous visit (from the past 240 hour(s)).   Radiology Studies: No results found. IR REPLACE G-TUBE COMPLEX WO FLUORO (TRACT REV)  Final Result    DG Swallowing Func-Speech Pathology  Final Result    DG CHEST PORT 1 VIEW  Final Result    DG CHEST PORT 1 VIEW  Final Result    DG Abd Portable 1V  Final Result    DG Chest Port 1 View  Final Result    DG Chest Portable  1 View  Final Result    IR Radiologist Eval & Mgmt    (Results Pending)    Scheduled Meds: . chlorhexidine  15 mL Mouth Rinse BID  . enoxaparin (LOVENOX) injection  40 mg Subcutaneous Q24H  . feeding supplement (PROSource TF)  45 mL Per Tube BID  . fentaNYL  1 patch Transdermal Q72H  . free water  100 mL Per Tube Q6H  . magnesium oxide  800 mg Per Tube Daily  . mouth rinse  15 mL Mouth Rinse q12n4p  . pantoprazole sodium  40 mg Per Tube q morning - 10a  . polyethylene glycol  17 g Per Tube Daily  . senna  2 tablet Per Tube Daily  . sertraline  50 mg Per Tube Daily  . thiamine  100 mg Per Tube Daily   PRN Meds: ipratropium-albuterol, LORazepam, oxyCODONE Continuous Infusions: . sodium chloride    . feeding supplement (JEVITY 1.5 CAL/FIBER) 1,000 mL (04/04/20 1659)     LOS: 19 days  Time spent: Greater than 50% of the 35 minute visit was spent in counseling/coordination of care for the patient as laid out in the A&P.   Dwyane Dee, MD [facility] 04/07/2020, 1:26 PM

## 2020-04-07 NOTE — TOC Progression Note (Signed)
Transition of Care [facility]) - Progression Note    Patient Details  Name: Jakhari Space MRN: [id number] Date of Birth: [birth year]  Transition of Care [facility] CM/SW Contact  Lennart Pall, LCSW Phone Number: 04/07/2020, 3:14 PM  Clinical Narrative:    TOC continues to follow.  Anticipate pt will reach 30d post trach placement on 04/19/20 at which time [facility] is agreed for his return to their SNF with Palliative to follow.  Please reach out if any questions about this.  Expected Discharge Plan: Skilled Nursing Facility Barriers to Discharge: Other (comment) (SNF pending trach >= 30 days post placement)  Expected Discharge Plan and Services Expected Discharge Plan: [place] In-house Referral: Clinical Social Work     Living arrangements for the past 2 months: [place] ([place] since 08/2019) Expected Discharge Date:  (unknown)               DME Arranged: N/A DME Agency: NA                   Social Determinants of Health ([place]) Interventions    Readmission Risk Interventions Readmission Risk Prevention Plan 07/27/2019  Transportation Screening Complete  HRI or [place] Complete  Social Work Consult for [place] Planning/Counseling Complete  Palliative Care Screening Complete  Medication Review [employer]) Complete  Some recent data might be hidden

## 2020-04-07 NOTE — Assessment & Plan Note (Signed)
-   Patient's BMI is Body mass index is 9.34 kg/m.. - Patient has the following signs/symptoms consistent with PCM: (fat loss, muscle loss, muscle wasting, cachexia). - seen by RD, appreciate assistance. Continue plan per RD to include ongoing TF and we will continue electrolyte replacement as needed

## 2020-04-08 DIAGNOSIS — J9621 Acute and chronic respiratory failure with hypoxia: Secondary | ICD-10-CM | POA: Diagnosis not present

## 2020-04-08 LAB — BASIC METABOLIC PANEL
Anion gap: 11 (ref 5–15)
BUN: 9 mg/dL (ref 8–23)
CO2: 28 mmol/L (ref 22–32)
Calcium: 8.4 mg/dL — ABNORMAL LOW (ref 8.9–10.3)
Chloride: 98 mmol/L (ref 98–111)
Creatinine, Ser: 0.43 mg/dL — ABNORMAL LOW (ref 0.61–1.24)
GFR, Estimated: >60 mL/min (ref >60)
Glucose, Bld: 135 mg/dL — ABNORMAL HIGH (ref 70–99)
Potassium: 3.4 mmol/L — ABNORMAL LOW (ref 3.5–5.1)
Sodium: 137 mmol/L (ref 135–145)

## 2020-04-08 LAB — CBC WITH DIFFERENTIAL/PLATELET
Abs Immature Granulocytes: 0.02 10*3/uL (ref 0.00–0.07)
Basophils Absolute: 0 10*3/uL (ref 0.0–0.1)
Basophils Relative: 0 %
Eosinophils Absolute: 0 10*3/uL (ref 0.0–0.5)
Eosinophils Relative: 1 %
HCT: 24.7 % — ABNORMAL LOW (ref 39.0–52.0)
Hemoglobin: 7.6 g/dL — ABNORMAL LOW (ref 13.0–17.0)
Immature Granulocytes: 1 %
Lymphocytes Relative: 18 %
Lymphs Abs: 0.8 10*3/uL (ref 0.7–4.0)
MCH: 23.5 pg — ABNORMAL LOW (ref 26.0–34.0)
MCHC: 30.8 g/dL (ref 30.0–36.0)
MCV: 76.5 fL — ABNORMAL LOW (ref 80.0–100.0)
Monocytes Absolute: 0.6 10*3/uL (ref 0.1–1.0)
Monocytes Relative: 14 %
Neutro Abs: 2.7 10*3/uL (ref 1.7–7.7)
Neutrophils Relative %: 66 %
Platelets: 347 10*3/uL (ref 150–400)
RBC: 3.23 MIL/uL — ABNORMAL LOW (ref 4.22–5.81)
RDW: 19.7 % — ABNORMAL HIGH (ref 11.5–15.5)
WBC: 4.1 10*3/uL (ref 4.0–10.5)
nRBC: 0 % (ref 0.0–0.2)

## 2020-04-08 LAB — MAGNESIUM: Magnesium: 1.3 mg/dL — ABNORMAL LOW (ref 1.7–2.4)

## 2020-04-08 LAB — GLUCOSE, CAPILLARY
Glucose-Capillary: 156 mg/dL — ABNORMAL HIGH (ref 70–99)
Glucose-Capillary: 118 mg/dL — ABNORMAL HIGH (ref 70–99)
Glucose-Capillary: 122 mg/dL — ABNORMAL HIGH (ref 70–99)

## 2020-04-08 MED ORDER — POTASSIUM CHLORIDE 20 MEQ PO PACK
40.0000 meq | PACK | Freq: Once | ORAL | Status: AC
  Administered 2020-04-08: 40 meq
  Filled 2020-04-08: qty 2

## 2020-04-08 MED ORDER — MAGNESIUM SULFATE 2 GM/50ML IV SOLN
2.0000 g | Freq: Once | INTRAVENOUS | Status: AC
  Administered 2020-04-08: 2 g via INTRAVENOUS
  Filled 2020-04-08: qty 50

## 2020-04-08 NOTE — Progress Notes (Signed)
Physical Therapy Treatment Patient Details Name: Devon Peterson MRN: [id number] DOB: 1955-05-24 Today's Date: 04/08/2020    History of Present Illness 65 y.o. 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 fib, aspiration pneumonia.  Patient presented to ED on 1/15 with complaint of shortness of breath. Dx of respiratory failure.  Recent admission 1/6-1/14/22 for sepsis 2* PNA.    PT Comments    Pt declines EOB/OOB or any activity other than bed level exercises.    Follow Up Recommendations  SNF (from SNF)     Equipment Recommendations  None recommended by PT    Recommendations for Other Services       Precautions / Restrictions Precautions Precautions: Fall Precaution Comments: currently on 5L O2 Trach collar, PEG NPO X ICE chips Restrictions Weight Bearing Restrictions: No    Mobility  Bed Mobility               General bed mobility comments: refused  Transfers                 General transfer comment: refused OOB  Ambulation/Gait                 Stairs             Wheelchair Mobility    Modified Rankin (Stroke Patients Only)       Balance                                            Cognition Arousal/Alertness: Awake/alert Behavior During Therapy: WFL for tasks assessed/performed Overall Cognitive Status: Within Functional Limits for tasks assessed                                 General Comments: AxO x 3 following all directions just non verbal due to [facility] but does use a white board to communicate      Exercises Total Joint Exercises Bridges: AROM;Both;5 reps General Exercises - Lower Extremity Ankle Circles/Pumps: AROM;Both;20 reps Short Arc Quad: AROM;Both;15 reps Heel Slides: AROM;Both;15 reps    General Comments        Pertinent Vitals/Pain Pain Assessment: Faces Faces  Pain Scale: Hurts little more Pain Location: throat, trach area with coughing Pain Descriptors / Indicators: Grimacing Pain Intervention(s): Monitored during session    Home Living                      Prior Function            PT Goals (current goals can now be found in the care plan section) Acute Rehab PT Goals Patient Stated Goal: none stated PT Goal Formulation: Patient unable to participate in goal setting Time For Goal Achievement: 04/12/20 Potential to Achieve Goals: Fair Progress towards PT goals: Not progressing toward goals - comment (limited participation with PT)    Frequency    Min 2X/week      PT Plan Current plan remains appropriate    Co-evaluation              AM-PAC PT "6 Clicks" Mobility   Outcome Measure  Help needed turning from your back to your side while in a flat bed without using bedrails?: A Little Help needed moving  from lying on your back to sitting on the side of a flat bed without using bedrails?: A Little Help needed moving to and from a bed to a chair (including a wheelchair)?: A Lot Help needed standing up from a chair using your arms (e.g., wheelchair or bedside chair)?: A Lot Help needed to walk in hospital room?: Total Help needed climbing 3-5 steps with a railing? : Total 6 Click Score: 12    End of Session   Activity Tolerance: Other (comment) (self limiting) Patient left: in bed;with call bell/phone within reach;with bed alarm set   PT Visit Diagnosis: Other abnormalities of gait and mobility (R26.89)     Time: 5525-8948 PT Time Calculation (min) (ACUTE ONLY): 19 min  Charges:  $Therapeutic Exercise: 8-22 mins                     Baxter Flattery, PT  Acute Rehab Dept ([place]) [phone] Pager [phone]  04/08/2020    [facility] 04/08/2020, 4:39 PM

## 2020-04-08 NOTE — Progress Notes (Signed)
Pt has been resting in bed during this shift, no distress noted. Required tracheal suctioning one time by this nurse., tolerated well. Pt has been medicated with pain med 3 times this shift due to complaints of neck pain.

## 2020-04-08 NOTE — Progress Notes (Signed)
PROGRESS NOTE    Devon Peterson   [id number]  DOB: 1955-07-20  DOA: 03/19/2020     20  PCP: Patient, No Pcp Per  CC: SOB  Hospital Course: Devon Peterson is a 65 y.o. 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 fib, aspiration pneumonia.  Patient presented to ED on 1/15 with complaint of shortness of breath.   May 2021, diagnosed with laryngeal cancer, underwent tracheostomy. 07/23/2019 -08/21/2019 , patient underwent radiation to larynx.   08/26/2019, tracheostomy was decannulated. Also in May 2021, CT scan of chest showed 2.7 cm mass in the left lower lobe.  Bronchoscopy was nondiagnostic.   02/11/2020, CT-guided biopsy of LLL mass showed squamous cell carcinoma.  Underwent 4 sessions of radiation till 03/15/2020 03/10/2020-03/18/2020, patient was admitted for stridor and dyspnea.  He was seen by ENT and recommended tracheostomy which he refused and has since discharged to SNF. 03/19/2020, patient presented to the ED again with dyspnea, started on hypoxia.  Patient was seen by ENT and underwent tracheostomy placement. 03/18/2020-oncology Dr. Benay Spice recommended hospice. 1/30, PEG tube clogged.  Tube feeding held. 1/31, PEG tube replaced by IR.  Tube feeding resumed.     Currently pending placement.  Difficult disposition because of new tracheostomy status.   Interval History:  No events overnight.  Hospital day 20 currently.  Comfortably resting in bed.  Asked him if he would like to write anything on his dry erase board again, he nodded his head no.  Asked if he was in any discomfort or pain, again he nodded no.  Asked if he understands the plan is to continue hospitalization until at least the 30-day mark before being able to go back to [facility] and he nodded his head yes.  Old records reviewed in assessment of this patient  ROS: Constitutional: negative for chills and  fevers, Respiratory: negative for cough, Cardiovascular: negative for chest pain and Gastrointestinal: negative for abdominal pain  Assessment & Plan: * Acute on chronic respiratory failure with hypoxia (HCC) Timeline of events as above. Underwent tracheostomy by ENT this admission. -Currently on 4 to 5 L oxygen through trach collar. -Outpatient follow-up with ENT Dr. Wilburn Cornelia ENT for tracheostomy care. -Speech therapy following forPassy-Muir valve. -Continue intermittent suctioning.  Goals of care, counseling/discussion Extensive discussion with family and patient regarding goals of care. -Currently full code. -Patient was interested in home hospice but unable to go home due to poor home condition. -Plan is to discharge him to SNF, treat what is treatable and follow-up and establish care with palliative care.  But he is a new tracheostomy and is a difficult placement.  Malignant neoplasm of lower lobe of left lung ([place]) -Seen by oncology and radiation oncology. Underwent radiation for laryngeal cancer and squamous cell lung cancer in the past.   -Per last note from oncology on 1/14, patient is a poor candidate for any systemic chemotherapy. Hospice was recommended. -Continue pain control with fentanyl patch, IV Dilaudid as needed oral oxycodone as needed.  Sepsis ([place]) - due to aspiration pna on admission - now s/p abx course  Severe protein-calorie malnutrition ([place]) - Patient's BMI is Body mass index is 9.34 kg/m.. - Patient has the following signs/symptoms consistent with PCM: (fat loss, muscle loss, muscle wasting, cachexia). - seen by RD, appreciate assistance. Continue plan per RD to include ongoing TF and we will continue electrolyte replacement as needed  Aspiration pneumonia (HCC)-resolved  as of 04/07/2020 - abx course completed  Normocytic anemia -No active bleeding but gradually downtrending hemoglobin level.  - intermittent monitoring   PAF (paroxysmal atrial  fibrillation) (HCC) -Rate intermittently not controlled. -Not on any anticoagulation. -Continue current medication.  PEG tube malfunction ([place]) 1/30, noted to have a clogged tube.  It was replaced by IR the next day 1/31.  Tube feeding resumed  COPD (chronic obstructive pulmonary disease) (HCC) - continue inhalers  Radiation pneumonitis (HCC)-resolved as of 04/07/2020 - s/p abx course  Laryngeal stridor-resolved as of 04/07/2020 - s/p trach placement   Antimicrobials:   DVT prophylaxis: enoxaparin (LOVENOX) injection 40 mg Start: 03/21/20 1600 Place and maintain sequential compression device Start: 03/19/20 0810   Code Status:   Code Status: Full Code Family Communication: none present  Disposition Plan: Status is: Inpatient  Remains inpatient appropriate because:Unsafe d/c plan and Inpatient level of care appropriate due to severity of illness   Dispo: The patient is from: [facility]              Anticipated d/c is to: [place]              Anticipated d/c date is: > 3 days              Patient currently is not medically stable to d/c.   Difficult to place patient Yes  Risk of unplanned readmission score: Unplanned Admission- Pilot do not use: 35.23   Objective: Blood pressure 102/67, pulse 98, temperature 98.9 F (37.2 C), temperature source Oral, resp. rate 16, height 5\' 11"  (1.803 m), weight 30.4 kg, SpO2 99 %.  Examination: General appearance: alert and no distress Head: Normocephalic, without obvious abnormality, atraumatic Eyes: EOMI Lungs: clear to auscultation bilaterally Heart: regular rate and rhythm and S1, S2 normal Abdomen: PEG in place. NT, ND, BS present Extremities: no edema, thin Skin: diffuse xerosis Neurologic: follows commands, moves all 4 extremities  Consultants:     Procedures:     Data Reviewed: I have personally reviewed following labs and imaging studies Results for orders placed or performed during the hospital encounter of  03/19/20 (from the past 24 hour(s))  Glucose, capillary     Status: Abnormal   Collection Time: 04/07/20 12:14 PM  Result Value Ref Range   Glucose-Capillary 108 (H) 70 - 99 mg/dL  Glucose, capillary     Status: Abnormal   Collection Time: 04/07/20  6:01 PM  Result Value Ref Range   Glucose-Capillary 117 (H) 70 - 99 mg/dL  Glucose, capillary     Status: Abnormal   Collection Time: 04/07/20 11:55 PM  Result Value Ref Range   Glucose-Capillary 127 (H) 70 - 99 mg/dL  Basic metabolic panel     Status: Abnormal   Collection Time: 04/08/20  3:11 AM  Result Value Ref Range   Sodium 137 135 - 145 mmol/L   Potassium 3.4 (L) 3.5 - 5.1 mmol/L   Chloride 98 98 - 111 mmol/L   CO2 28 22 - 32 mmol/L   Glucose, Bld 135 (H) 70 - 99 mg/dL   BUN 9 8 - 23 mg/dL   Creatinine, Ser 0.43 (L) 0.61 - 1.24 mg/dL   Calcium 8.4 (L) 8.9 - 10.3 mg/dL   GFR, Estimated >60 >60 mL/min   Anion gap 11 5 - 15  CBC with Differential/Platelet     Status: Abnormal   Collection Time: 04/08/20  3:11 AM  Result Value Ref Range   WBC 4.1 4.0 - 10.5  K/uL   RBC 3.23 (L) 4.22 - 5.81 MIL/uL   Hemoglobin 7.6 (L) 13.0 - 17.0 g/dL   HCT 24.7 (L) 39.0 - 52.0 %   MCV 76.5 (L) 80.0 - 100.0 fL   MCH 23.5 (L) 26.0 - 34.0 pg   MCHC 30.8 30.0 - 36.0 g/dL   RDW 19.7 (H) 11.5 - 15.5 %   Platelets 347 150 - 400 K/uL   nRBC 0.0 0.0 - 0.2 %   Neutrophils Relative % 66 %   Neutro Abs 2.7 1.7 - 7.7 K/uL   Lymphocytes Relative 18 %   Lymphs Abs 0.8 0.7 - 4.0 K/uL   Monocytes Relative 14 %   Monocytes Absolute 0.6 0.1 - 1.0 K/uL   Eosinophils Relative 1 %   Eosinophils Absolute 0.0 0.0 - 0.5 K/uL   Basophils Relative 0 %   Basophils Absolute 0.0 0.0 - 0.1 K/uL   Immature Granulocytes 1 %   Abs Immature Granulocytes 0.02 0.00 - 0.07 K/uL  Magnesium     Status: Abnormal   Collection Time: 04/08/20  3:11 AM  Result Value Ref Range   Magnesium 1.3 (L) 1.7 - 2.4 mg/dL  Glucose, capillary     Status: Abnormal   Collection Time:  04/08/20  6:18 AM  Result Value Ref Range   Glucose-Capillary 156 (H) 70 - 99 mg/dL  Glucose, capillary     Status: Abnormal   Collection Time: 04/08/20 11:41 AM  Result Value Ref Range   Glucose-Capillary 118 (H) 70 - 99 mg/dL    No results found for this or any previous visit (from the past 240 hour(s)).   Radiology Studies: No results found. IR REPLACE G-TUBE COMPLEX WO FLUORO (TRACT REV)  Final Result    DG Swallowing Func-Speech Pathology  Final Result    DG CHEST PORT 1 VIEW  Final Result    DG CHEST PORT 1 VIEW  Final Result    DG Abd Portable 1V  Final Result    DG Chest Port 1 View  Final Result    DG Chest Portable 1 View  Final Result    IR Radiologist Eval & Mgmt    (Results Pending)    Scheduled Meds: . chlorhexidine  15 mL Mouth Rinse BID  . enoxaparin (LOVENOX) injection  40 mg Subcutaneous Q24H  . feeding supplement (PROSource TF)  45 mL Per Tube BID  . fentaNYL  1 patch Transdermal Q72H  . free water  100 mL Per Tube Q6H  . magnesium oxide  800 mg Per Tube Daily  . mouth rinse  15 mL Mouth Rinse q12n4p  . pantoprazole sodium  40 mg Per Tube q morning - 10a  . polyethylene glycol  17 g Per Tube Daily  . senna  2 tablet Per Tube Daily  . sertraline  50 mg Per Tube Daily  . thiamine  100 mg Per Tube Daily   PRN Meds: ipratropium-albuterol, LORazepam, oxyCODONE Continuous Infusions: . sodium chloride    . feeding supplement (JEVITY 1.5 CAL/FIBER) 1,000 mL (04/07/20 1843)     LOS: 20 days  Time spent: Greater than 50% of the 35 minute visit was spent in counseling/coordination of care for the patient as laid out in the A&P.   Dwyane Dee, MD [facility] 04/08/2020, 11:45 AM

## 2020-04-08 NOTE — Plan of Care (Signed)
  Problem: Nutrition: Goal: Adequate nutrition will be maintained Outcome: Progressing   

## 2020-04-09 DIAGNOSIS — J9621 Acute and chronic respiratory failure with hypoxia: Secondary | ICD-10-CM | POA: Diagnosis not present

## 2020-04-09 LAB — CBC WITH DIFFERENTIAL/PLATELET
Abs Immature Granulocytes: 0.04 10*3/uL (ref 0.00–0.07)
Basophils Absolute: 0 10*3/uL (ref 0.0–0.1)
Basophils Relative: 0 %
Eosinophils Absolute: 0 10*3/uL (ref 0.0–0.5)
Eosinophils Relative: 1 %
HCT: 23.6 % — ABNORMAL LOW (ref 39.0–52.0)
Hemoglobin: 7.4 g/dL — ABNORMAL LOW (ref 13.0–17.0)
Immature Granulocytes: 1 %
Lymphocytes Relative: 16 %
Lymphs Abs: 0.8 10*3/uL (ref 0.7–4.0)
MCH: 23.4 pg — ABNORMAL LOW (ref 26.0–34.0)
MCHC: 31.4 g/dL (ref 30.0–36.0)
MCV: 74.7 fL — ABNORMAL LOW (ref 80.0–100.0)
Monocytes Absolute: 0.7 10*3/uL (ref 0.1–1.0)
Monocytes Relative: 14 %
Neutro Abs: 3.6 10*3/uL (ref 1.7–7.7)
Neutrophils Relative %: 68 %
Platelets: 359 10*3/uL (ref 150–400)
RBC: 3.16 MIL/uL — ABNORMAL LOW (ref 4.22–5.81)
RDW: 19.6 % — ABNORMAL HIGH (ref 11.5–15.5)
WBC: 5.3 10*3/uL (ref 4.0–10.5)
nRBC: 0 % (ref 0.0–0.2)

## 2020-04-09 LAB — GLUCOSE, CAPILLARY
Glucose-Capillary: 105 mg/dL — ABNORMAL HIGH (ref 70–99)
Glucose-Capillary: 129 mg/dL — ABNORMAL HIGH (ref 70–99)
Glucose-Capillary: 129 mg/dL — ABNORMAL HIGH (ref 70–99)
Glucose-Capillary: 139 mg/dL — ABNORMAL HIGH (ref 70–99)
Glucose-Capillary: 99 mg/dL (ref 70–99)

## 2020-04-09 LAB — BASIC METABOLIC PANEL
Anion gap: 7 (ref 5–15)
BUN: 10 mg/dL (ref 8–23)
CO2: 27 mmol/L (ref 22–32)
Calcium: 8.3 mg/dL — ABNORMAL LOW (ref 8.9–10.3)
Chloride: 96 mmol/L — ABNORMAL LOW (ref 98–111)
Creatinine, Ser: 0.39 mg/dL — ABNORMAL LOW (ref 0.61–1.24)
GFR, Estimated: >60 mL/min (ref >60)
Glucose, Bld: 140 mg/dL — ABNORMAL HIGH (ref 70–99)
Potassium: 3.9 mmol/L (ref 3.5–5.1)
Sodium: 130 mmol/L — ABNORMAL LOW (ref 135–145)

## 2020-04-09 LAB — MAGNESIUM: Magnesium: 1.4 mg/dL — ABNORMAL LOW (ref 1.7–2.4)

## 2020-04-09 MED ORDER — MAGNESIUM SULFATE 2 GM/50ML IV SOLN
2.0000 g | Freq: Once | INTRAVENOUS | Status: AC
  Administered 2020-04-09: 2 g via INTRAVENOUS
  Filled 2020-04-09: qty 50

## 2020-04-09 MED ORDER — FREE WATER
100.0000 mL | Freq: Three times a day (TID) | Status: DC
  Administered 2020-04-09 – 2020-04-12 (×9): 100 mL

## 2020-04-09 NOTE — Plan of Care (Signed)
  Problem: Education: Goal: Knowledge about tracheostomy care/management will improve Outcome: Progressing   Problem: Health Behavior/Discharge Planning: Goal: Ability to manage tracheostomy will improve Outcome: Progressing

## 2020-04-09 NOTE — Plan of Care (Signed)
  Problem: Activity: Goal: Risk for activity intolerance will decrease Outcome: Progressing   Problem: Nutrition: Goal: Adequate nutrition will be maintained Outcome: Progressing   Problem: Skin Integrity: Goal: Risk for impaired skin integrity will decrease Outcome: Progressing   Problem: Education: Goal: Knowledge about tracheostomy care/management will improve Outcome: Progressing   Problem: Respiratory: Goal: Patent airway maintenance will improve Outcome: Progressing

## 2020-04-09 NOTE — Progress Notes (Signed)
PROGRESS NOTE    Devon Peterson   [id number]  DOB: [birth year]  DOA: 03/19/2020     21  PCP: Patient, No Pcp Per  CC: SOB  Hospital Course: Devon Peterson is a 65 y.o. 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 fib, aspiration pneumonia.  Patient presented to ED on 1/15 with complaint of shortness of breath.   May 2021, diagnosed with laryngeal cancer, underwent tracheostomy. 07/23/2019 -08/21/2019 , patient underwent radiation to larynx.   08/26/2019, tracheostomy was decannulated. Also in May 2021, CT scan of chest showed 2.7 cm mass in the left lower lobe.  Bronchoscopy was nondiagnostic.   02/11/2020, CT-guided biopsy of LLL mass showed squamous cell carcinoma.  Underwent 4 sessions of radiation till 03/15/2020 03/10/2020-03/18/2020, patient was admitted for stridor and dyspnea.  He was seen by ENT and recommended tracheostomy which he refused and has since discharged to SNF. 03/19/2020, patient presented to the ED again with dyspnea, started on hypoxia.  Patient was seen by ENT and underwent tracheostomy placement. 03/18/2020-oncology Dr. Benay Spice recommended hospice. 1/30, PEG tube clogged.  Tube feeding held. 1/31, PEG tube replaced by IR.  Tube feeding resumed.     Currently pending placement.  Difficult disposition because of new tracheostomy status.   Interval History:  No events overnight.  Hospital day 21 currently.  Had some pain this morning but states that pain medication did help by nodding his head yes when asked.  He otherwise appeared comfortable and shook his head no when asked if he had any other questions or concerns for me.  Old records reviewed in assessment of this patient  ROS: Constitutional: negative for chills and fevers, Respiratory: negative for cough, Cardiovascular: negative for chest pain and Gastrointestinal: negative for abdominal  pain  Assessment & Plan: * Acute on chronic respiratory failure with hypoxia (HCC) Timeline of events as above. Underwent tracheostomy by ENT this admission. -Currently on 4 to 5 L oxygen through trach collar. -Outpatient follow-up with ENT Dr. Wilburn Cornelia ENT for tracheostomy care. -Speech therapy following forPassy-Muir valve. -Continue intermittent suctioning.  Goals of care, counseling/discussion Extensive discussion with family and patient regarding goals of care. -Currently full code. -Patient was interested in home hospice but unable to go home due to poor home condition. -Plan is to discharge him to SNF, treat what is treatable and follow-up and establish care with palliative care.  But he is a new tracheostomy and is a difficult placement.  Malignant neoplasm of lower lobe of left lung ([place]) -Seen by oncology and radiation oncology. Underwent radiation for laryngeal cancer and squamous cell lung cancer in the past.   -Per last note from oncology on 1/14, patient is a poor candidate for any systemic chemotherapy. Hospice was recommended. -Continue pain control with fentanyl patch, IV Dilaudid as needed oral oxycodone as needed.  Sepsis ([place]) - due to aspiration pna on admission - now s/p abx course  Severe protein-calorie malnutrition ([place]) - Patient's BMI is Body mass index is 9.34 kg/m.. - Patient has the following signs/symptoms consistent with PCM: (fat loss, muscle loss, muscle wasting, cachexia). - seen by RD, appreciate assistance. Continue plan per RD to include ongoing TF and we will continue electrolyte replacement as needed  Aspiration pneumonia (HCC)-resolved as of 04/07/2020 - abx course completed  Normocytic anemia -No active bleeding but gradually downtrending hemoglobin level.  - intermittent monitoring   PAF (paroxysmal atrial fibrillation) (HCC) -Rate  intermittently not controlled. -Not on any anticoagulation. -Continue current medication.  PEG tube  malfunction ([place]) 1/30, noted to have a clogged tube.  It was replaced by IR the next day 1/31.  Tube feeding resumed  COPD (chronic obstructive pulmonary disease) (HCC) - continue inhalers  Radiation pneumonitis (HCC)-resolved as of 04/07/2020 - s/p abx course  Laryngeal stridor-resolved as of 04/07/2020 - s/p trach placement   Antimicrobials:   DVT prophylaxis: enoxaparin (LOVENOX) injection 40 mg Start: 03/21/20 1600 Place and maintain sequential compression device Start: 03/19/20 0810   Code Status:   Code Status: Full Code Family Communication: none present  Disposition Plan: Status is: Inpatient  Remains inpatient appropriate because:Unsafe d/c plan, Inpatient level of care appropriate due to severity of illness and Waiting until 30 days of trach prior to being able to be discharged   Dispo: The patient is from: [facility]              Anticipated d/c is to: [place]              Anticipated d/c date is: > 3 days              Patient currently is not medically stable to d/c.   Difficult to place patient Yes  Risk of unplanned readmission score: Unplanned Admission- Pilot do not use: 35.12   Objective: Blood pressure 105/72, pulse 83, temperature 99.6 F (37.6 C), temperature source Oral, resp. rate 17, height 5\' 11"  (1.803 m), weight 30.4 kg, SpO2 97 %.  Examination: General appearance: alert and no distress Head: Normocephalic, without obvious abnormality, atraumatic Eyes: EOMI Lungs: clear to auscultation bilaterally Heart: regular rate and rhythm and S1, S2 normal Abdomen: PEG in place. NT, ND, BS present Extremities: no edema, thin Skin: diffuse xerosis Neurologic: follows commands, moves all 4 extremities  Consultants:     Procedures:     Data Reviewed: I have personally reviewed following labs and imaging studies Results for orders placed or performed during the hospital encounter of 03/19/20 (from the past 24 hour(s))  Glucose, capillary      Status: Abnormal   Collection Time: 04/08/20  6:29 PM  Result Value Ref Range   Glucose-Capillary 122 (H) 70 - 99 mg/dL  Glucose, capillary     Status: Abnormal   Collection Time: 04/09/20 12:00 AM  Result Value Ref Range   Glucose-Capillary 105 (H) 70 - 99 mg/dL  Basic metabolic panel     Status: Abnormal   Collection Time: 04/09/20  3:14 AM  Result Value Ref Range   Sodium 130 (L) 135 - 145 mmol/L   Potassium 3.9 3.5 - 5.1 mmol/L   Chloride 96 (L) 98 - 111 mmol/L   CO2 27 22 - 32 mmol/L   Glucose, Bld 140 (H) 70 - 99 mg/dL   BUN 10 8 - 23 mg/dL   Creatinine, Ser 0.39 (L) 0.61 - 1.24 mg/dL   Calcium 8.3 (L) 8.9 - 10.3 mg/dL   GFR, Estimated >60 >60 mL/min   Anion gap 7 5 - 15  CBC with Differential/Platelet     Status: Abnormal   Collection Time: 04/09/20  3:14 AM  Result Value Ref Range   WBC 5.3 4.0 - 10.5 K/uL   RBC 3.16 (L) 4.22 - 5.81 MIL/uL   Hemoglobin 7.4 (L) 13.0 - 17.0 g/dL   HCT 23.6 (L) 39.0 - 52.0 %   MCV 74.7 (L) 80.0 - 100.0 fL   MCH 23.4 (L) 26.0 - 34.0 pg  MCHC 31.4 30.0 - 36.0 g/dL   RDW 19.6 (H) 11.5 - 15.5 %   Platelets 359 150 - 400 K/uL   nRBC 0.0 0.0 - 0.2 %   Neutrophils Relative % 68 %   Neutro Abs 3.6 1.7 - 7.7 K/uL   Lymphocytes Relative 16 %   Lymphs Abs 0.8 0.7 - 4.0 K/uL   Monocytes Relative 14 %   Monocytes Absolute 0.7 0.1 - 1.0 K/uL   Eosinophils Relative 1 %   Eosinophils Absolute 0.0 0.0 - 0.5 K/uL   Basophils Relative 0 %   Basophils Absolute 0.0 0.0 - 0.1 K/uL   Immature Granulocytes 1 %   Abs Immature Granulocytes 0.04 0.00 - 0.07 K/uL  Magnesium     Status: Abnormal   Collection Time: 04/09/20  3:14 AM  Result Value Ref Range   Magnesium 1.4 (L) 1.7 - 2.4 mg/dL  Glucose, capillary     Status: Abnormal   Collection Time: 04/09/20  6:10 AM  Result Value Ref Range   Glucose-Capillary 129 (H) 70 - 99 mg/dL  Glucose, capillary     Status: Abnormal   Collection Time: 04/09/20 11:47 AM  Result Value Ref Range    Glucose-Capillary 129 (H) 70 - 99 mg/dL    No results found for this or any previous visit (from the past 240 hour(s)).   Radiology Studies: No results found. IR REPLACE G-TUBE COMPLEX WO FLUORO (TRACT REV)  Final Result    DG Swallowing Func-Speech Pathology  Final Result    DG CHEST PORT 1 VIEW  Final Result    DG CHEST PORT 1 VIEW  Final Result    DG Abd Portable 1V  Final Result    DG Chest Port 1 View  Final Result    DG Chest Portable 1 View  Final Result      Scheduled Meds: . chlorhexidine  15 mL Mouth Rinse BID  . enoxaparin (LOVENOX) injection  40 mg Subcutaneous Q24H  . feeding supplement (PROSource TF)  45 mL Per Tube BID  . fentaNYL  1 patch Transdermal Q72H  . free water  100 mL Per Tube Q8H  . magnesium oxide  800 mg Per Tube Daily  . mouth rinse  15 mL Mouth Rinse q12n4p  . pantoprazole sodium  40 mg Per Tube q morning - 10a  . polyethylene glycol  17 g Per Tube Daily  . senna  2 tablet Per Tube Daily  . sertraline  50 mg Per Tube Daily  . thiamine  100 mg Per Tube Daily   PRN Meds: ipratropium-albuterol, LORazepam, oxyCODONE Continuous Infusions: . sodium chloride Stopped (04/09/20 0918)  . feeding supplement (JEVITY 1.5 CAL/FIBER) 1,000 mL (04/09/20 0434)     LOS: 21 days  Time spent: Greater than 50% of the 35 minute visit was spent in counseling/coordination of care for the patient as laid out in the A&P.   Dwyane Dee, MD [facility] 04/09/2020, 12:21 PM

## 2020-04-10 DIAGNOSIS — J9621 Acute and chronic respiratory failure with hypoxia: Secondary | ICD-10-CM | POA: Diagnosis not present

## 2020-04-10 LAB — CBC WITH DIFFERENTIAL/PLATELET
Abs Immature Granulocytes: 0.06 10*3/uL (ref 0.00–0.07)
Basophils Absolute: 0 10*3/uL (ref 0.0–0.1)
Basophils Relative: 0 %
Eosinophils Absolute: 0.1 10*3/uL (ref 0.0–0.5)
Eosinophils Relative: 1 %
HCT: 25.6 % — ABNORMAL LOW (ref 39.0–52.0)
Hemoglobin: 8 g/dL — ABNORMAL LOW (ref 13.0–17.0)
Immature Granulocytes: 1 %
Lymphocytes Relative: 17 %
Lymphs Abs: 1.1 10*3/uL (ref 0.7–4.0)
MCH: 23.1 pg — ABNORMAL LOW (ref 26.0–34.0)
MCHC: 31.3 g/dL (ref 30.0–36.0)
MCV: 73.8 fL — ABNORMAL LOW (ref 80.0–100.0)
Monocytes Absolute: 0.9 10*3/uL (ref 0.1–1.0)
Monocytes Relative: 15 %
Neutro Abs: 4.2 10*3/uL (ref 1.7–7.7)
Neutrophils Relative %: 66 %
Platelets: 418 10*3/uL — ABNORMAL HIGH (ref 150–400)
RBC: 3.47 MIL/uL — ABNORMAL LOW (ref 4.22–5.81)
RDW: 19.8 % — ABNORMAL HIGH (ref 11.5–15.5)
WBC: 6.4 10*3/uL (ref 4.0–10.5)
nRBC: 0.3 % — ABNORMAL HIGH (ref 0.0–0.2)

## 2020-04-10 LAB — BASIC METABOLIC PANEL
Anion gap: 12 (ref 5–15)
BUN: 10 mg/dL (ref 8–23)
CO2: 28 mmol/L (ref 22–32)
Calcium: 8.9 mg/dL (ref 8.9–10.3)
Chloride: 94 mmol/L — ABNORMAL LOW (ref 98–111)
Creatinine, Ser: 0.37 mg/dL — ABNORMAL LOW (ref 0.61–1.24)
GFR, Estimated: >60 mL/min (ref >60)
Glucose, Bld: 136 mg/dL — ABNORMAL HIGH (ref 70–99)
Potassium: 4.3 mmol/L (ref 3.5–5.1)
Sodium: 134 mmol/L — ABNORMAL LOW (ref 135–145)

## 2020-04-10 LAB — GLUCOSE, CAPILLARY
Glucose-Capillary: 105 mg/dL — ABNORMAL HIGH (ref 70–99)
Glucose-Capillary: 130 mg/dL — ABNORMAL HIGH (ref 70–99)
Glucose-Capillary: 132 mg/dL — ABNORMAL HIGH (ref 70–99)
Glucose-Capillary: 144 mg/dL — ABNORMAL HIGH (ref 70–99)

## 2020-04-10 LAB — MAGNESIUM: Magnesium: 1.4 mg/dL — ABNORMAL LOW (ref 1.7–2.4)

## 2020-04-10 LAB — PHOSPHORUS: Phosphorus: 3 mg/dL (ref 2.5–4.6)

## 2020-04-10 NOTE — Progress Notes (Signed)
PT denies need for trach suctioning at this time.

## 2020-04-10 NOTE — Progress Notes (Signed)
Placed PT on new atc- uneventful.

## 2020-04-10 NOTE — Progress Notes (Signed)
[facility])  Mr. Kataoka was referred to [facility] originally for hospice at discharge. Per [facility] plans are now to discharge to [facility] with [facility] palliative services. Patient is not wanting comfort measures at this time. [facility] team notified and we will continue to follow patient until discharge.   Palliative team will follow up at discharge.  Thank you, Clementeen Hoof, BSN, RN [phone]

## 2020-04-10 NOTE — Plan of Care (Signed)
  Problem: Respiratory: Goal: Patent airway maintenance will improve Outcome: Progressing

## 2020-04-10 NOTE — Progress Notes (Signed)
   04/10/20 1534  Assess: MEWS Score  Pulse Rate (!) 112  Resp 19  SpO2 96 %  O2 Device Tracheostomy Collar  O2 Flow Rate (L/min) 5 L/min  FiO2 (%) 28 %  Assess: MEWS Score  MEWS Temp 0  MEWS Systolic 0  MEWS Pulse 2  MEWS RR 0  MEWS LOC 0  MEWS Score 2  MEWS Score Color Yellow  Assess: if the MEWS score is Yellow or Red  Were vital signs taken at a resting state? No (increased while working with resp therapy/trach care)  Focused Assessment No change from prior assessment  Early Detection of Sepsis Score *See Row Information* Low  Treat  MEWS Interventions Other (Comment) (rechecking when respiratory therapy is done with pt /trach care.)  Take Vital Signs  Increase Vital Sign Frequency  Yellow: Q 2hr X 2 then Q 4hr X 2, if remains yellow, continue Q 4hrs (will implement if HR still increased after respiratory done)  Escalate  MEWS: Escalate Yellow: discuss with charge nurse/RN and consider discussing with provider and RRT (notified charge of mews and plan to recheck/dc if lowered after respiratory/trach care)  Notify: Charge Nurse/RN  Name of Charge Nurse/RN Notified Amy Adams  Date Charge Nurse/RN Notified 04/10/20  Time Charge Nurse/RN Notified 2080  Notify: Provider  Provider Name/Title [facility]  Date Provider Notified 04/10/20  Time Provider Notified 1540  Notification Type Page  Notification Reason Other (Comment) (Increase in pt HR)  Response Other (Comment) (Will notify if continues to be increased after treatment)

## 2020-04-10 NOTE — Progress Notes (Signed)
PROGRESS NOTE    Devon Peterson   [id number]  DOB: 09/08/1955  DOA: 03/19/2020     22  PCP: Patient, No Pcp Per  CC: SOB  Hospital Course: Devon Peterson is a 65 y.o. 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 fib, aspiration pneumonia.  Patient presented to ED on 1/15 with complaint of shortness of breath.   May 2021, diagnosed with laryngeal cancer, underwent tracheostomy. 07/23/2019 -08/21/2019 , patient underwent radiation to larynx.   08/26/2019, tracheostomy was decannulated. Also in May 2021, CT scan of chest showed 2.7 cm mass in the left lower lobe.  Bronchoscopy was nondiagnostic.   02/11/2020, CT-guided biopsy of LLL mass showed squamous cell carcinoma.  Underwent 4 sessions of radiation till 03/15/2020 03/10/2020-03/18/2020, patient was admitted for stridor and dyspnea.  He was seen by ENT and recommended tracheostomy which he refused and has since discharged to SNF. 03/19/2020, patient presented to the ED again with dyspnea, started on hypoxia.  Patient was seen by ENT and underwent tracheostomy placement. 03/18/2020-oncology Dr. Benay Spice recommended hospice. 1/30, PEG tube clogged.  Tube feeding held. 1/31, PEG tube replaced by IR.  Tube feeding resumed.     Currently pending placement.  Difficult disposition because of new tracheostomy status.   Interval History:  No events overnight.  Hospital day 22 currently.  Resting comfortably in usual state. Denied pain this morning and was comfortable.   Old records reviewed in assessment of this patient  ROS: Constitutional: negative for chills and fevers, Respiratory: negative for cough, Cardiovascular: negative for chest pain and Gastrointestinal: negative for abdominal pain  Assessment & Plan: * Acute on chronic respiratory failure with hypoxia (HCC) Timeline of events as above. Underwent tracheostomy by ENT this  admission. -Currently on 4 to 5 L oxygen through trach collar. -Outpatient follow-up with ENT Dr. Wilburn Cornelia ENT for tracheostomy care. -Speech therapy following forPassy-Muir valve. -Continue intermittent suctioning.  Goals of care, counseling/discussion Extensive discussion with family and patient regarding goals of care. -Currently full code. -Patient was interested in home hospice but unable to go home due to poor home condition. -Plan is to discharge him to SNF, treat what is treatable and follow-up and establish care with palliative care.  But he is a new tracheostomy and is a difficult placement.  Malignant neoplasm of lower lobe of left lung ([place]) -Seen by oncology and radiation oncology. Underwent radiation for laryngeal cancer and squamous cell lung cancer in the past.   -Per last note from oncology on 1/14, patient is a poor candidate for any systemic chemotherapy. Hospice was recommended. -Continue pain control with fentanyl patch, IV Dilaudid as needed oral oxycodone as needed.  Sepsis ([place]) - due to aspiration pna on admission - now s/p abx course  Severe protein-calorie malnutrition ([place]) - Patient's BMI is Body mass index is 9.34 kg/m.. - Patient has the following signs/symptoms consistent with PCM: (fat loss, muscle loss, muscle wasting, cachexia). - seen by RD, appreciate assistance. Continue plan per RD to include ongoing TF and we will continue electrolyte replacement as needed  Aspiration pneumonia (HCC)-resolved as of 04/07/2020 - abx course completed  Normocytic anemia -No active bleeding but gradually downtrending hemoglobin level.  - intermittent monitoring   PAF (paroxysmal atrial fibrillation) (HCC) -Rate intermittently not controlled. -Not on any anticoagulation. -Continue current medication.  PEG tube malfunction ([place]) 1/30, noted to have a clogged tube.  It was replaced by IR  the next day 1/31.  Tube feeding resumed  COPD (chronic obstructive  pulmonary disease) (HCC) - continue inhalers  Radiation pneumonitis (HCC)-resolved as of 04/07/2020 - s/p abx course  Laryngeal stridor-resolved as of 04/07/2020 - s/p trach placement   Antimicrobials:   DVT prophylaxis: enoxaparin (LOVENOX) injection 40 mg Start: 03/21/20 1600 Place and maintain sequential compression device Start: 03/19/20 0810   Code Status:   Code Status: Full Code Family Communication: none present  Disposition Plan: Status is: Inpatient  Remains inpatient appropriate because:Unsafe d/c plan, Inpatient level of care appropriate due to severity of illness and Waiting until 30 days of trach prior to being able to be discharged   Dispo: The patient is from: [facility]              Anticipated d/c is to: [place]              Anticipated d/c date is: > 3 days              Patient currently is not medically stable to d/c.   Difficult to place patient Yes  Risk of unplanned readmission score: Unplanned Admission- Pilot do not use: 34.21   Objective: Blood pressure 109/74, pulse (!) 104, temperature 97.9 F (36.6 C), resp. rate 18, height 5\' 11"  (1.803 m), weight 30.4 kg, SpO2 95 %.  Examination: General appearance: alert and no distress Head: Normocephalic, without obvious abnormality, atraumatic Eyes: EOMI Lungs: clear to auscultation bilaterally Heart: regular rate and rhythm and S1, S2 normal Abdomen: PEG in place. NT, ND, BS present Extremities: no edema, thin Skin: diffuse xerosis Neurologic: follows commands, moves all 4 extremities  Consultants:     Procedures:     Data Reviewed: I have personally reviewed following labs and imaging studies Results for orders placed or performed during the hospital encounter of 03/19/20 (from the past 24 hour(s))  Glucose, capillary     Status: Abnormal   Collection Time: 04/09/20  5:36 PM  Result Value Ref Range   Glucose-Capillary 139 (H) 70 - 99 mg/dL  Glucose, capillary     Status: None    Collection Time: 04/09/20 11:57 PM  Result Value Ref Range   Glucose-Capillary 99 70 - 99 mg/dL  Basic metabolic panel     Status: Abnormal   Collection Time: 04/10/20  3:12 AM  Result Value Ref Range   Sodium 134 (L) 135 - 145 mmol/L   Potassium 4.3 3.5 - 5.1 mmol/L   Chloride 94 (L) 98 - 111 mmol/L   CO2 28 22 - 32 mmol/L   Glucose, Bld 136 (H) 70 - 99 mg/dL   BUN 10 8 - 23 mg/dL   Creatinine, Ser 0.37 (L) 0.61 - 1.24 mg/dL   Calcium 8.9 8.9 - 10.3 mg/dL   GFR, Estimated >60 >60 mL/min   Anion gap 12 5 - 15  CBC with Differential/Platelet     Status: Abnormal   Collection Time: 04/10/20  3:12 AM  Result Value Ref Range   WBC 6.4 4.0 - 10.5 K/uL   RBC 3.47 (L) 4.22 - 5.81 MIL/uL   Hemoglobin 8.0 (L) 13.0 - 17.0 g/dL   HCT 25.6 (L) 39.0 - 52.0 %   MCV 73.8 (L) 80.0 - 100.0 fL   MCH 23.1 (L) 26.0 - 34.0 pg   MCHC 31.3 30.0 - 36.0 g/dL   RDW 19.8 (H) 11.5 - 15.5 %   Platelets 418 (H) 150 - 400 K/uL   nRBC 0.3 (H) 0.0 -  0.2 %   Neutrophils Relative % 66 %   Neutro Abs 4.2 1.7 - 7.7 K/uL   Lymphocytes Relative 17 %   Lymphs Abs 1.1 0.7 - 4.0 K/uL   Monocytes Relative 15 %   Monocytes Absolute 0.9 0.1 - 1.0 K/uL   Eosinophils Relative 1 %   Eosinophils Absolute 0.1 0.0 - 0.5 K/uL   Basophils Relative 0 %   Basophils Absolute 0.0 0.0 - 0.1 K/uL   Immature Granulocytes 1 %   Abs Immature Granulocytes 0.06 0.00 - 0.07 K/uL  Magnesium     Status: Abnormal   Collection Time: 04/10/20  3:12 AM  Result Value Ref Range   Magnesium 1.4 (L) 1.7 - 2.4 mg/dL  Phosphorus     Status: None   Collection Time: 04/10/20  3:12 AM  Result Value Ref Range   Phosphorus 3.0 2.5 - 4.6 mg/dL  Glucose, capillary     Status: Abnormal   Collection Time: 04/10/20  5:29 AM  Result Value Ref Range   Glucose-Capillary 144 (H) 70 - 99 mg/dL  Glucose, capillary     Status: Abnormal   Collection Time: 04/10/20 11:49 AM  Result Value Ref Range   Glucose-Capillary 130 (H) 70 - 99 mg/dL    No results  found for this or any previous visit (from the past 240 hour(s)).   Radiology Studies: No results found. IR REPLACE G-TUBE COMPLEX WO FLUORO (TRACT REV)  Final Result    DG Swallowing Func-Speech Pathology  Final Result    DG CHEST PORT 1 VIEW  Final Result    DG CHEST PORT 1 VIEW  Final Result    DG Abd Portable 1V  Final Result    DG Chest Port 1 View  Final Result    DG Chest Portable 1 View  Final Result      Scheduled Meds: . chlorhexidine  15 mL Mouth Rinse BID  . enoxaparin (LOVENOX) injection  40 mg Subcutaneous Q24H  . feeding supplement (PROSource TF)  45 mL Per Tube BID  . fentaNYL  1 patch Transdermal Q72H  . free water  100 mL Per Tube Q8H  . magnesium oxide  800 mg Per Tube Daily  . mouth rinse  15 mL Mouth Rinse q12n4p  . pantoprazole sodium  40 mg Per Tube q morning - 10a  . polyethylene glycol  17 g Per Tube Daily  . senna  2 tablet Per Tube Daily  . sertraline  50 mg Per Tube Daily  . thiamine  100 mg Per Tube Daily   PRN Meds: ipratropium-albuterol, LORazepam, oxyCODONE Continuous Infusions: . sodium chloride 10 mL/hr at 04/09/20 1745  . feeding supplement (JEVITY 1.5 CAL/FIBER) 1,000 mL (04/09/20 1743)     LOS: 22 days  Time spent: Greater than 50% of the 35 minute visit was spent in counseling/coordination of care for the patient as laid out in the A&P.   Dwyane Dee, MD [facility] 04/10/2020, 1:05 PM

## 2020-04-10 NOTE — Plan of Care (Signed)
  Problem: Clinical Measurements: Goal: Will remain free from infection Outcome: Progressing   Problem: Respiratory: Goal: Patent airway maintenance will improve Outcome: Progressing   Problem: Health Behavior/Discharge Planning: Goal: Ability to manage tracheostomy will improve Outcome: Progressing

## 2020-04-11 DIAGNOSIS — J9621 Acute and chronic respiratory failure with hypoxia: Secondary | ICD-10-CM | POA: Diagnosis not present

## 2020-04-11 LAB — GLUCOSE, CAPILLARY
Glucose-Capillary: 121 mg/dL — ABNORMAL HIGH (ref 70–99)
Glucose-Capillary: 98 mg/dL (ref 70–99)
Glucose-Capillary: 118 mg/dL — ABNORMAL HIGH (ref 70–99)

## 2020-04-11 LAB — CBC WITH DIFFERENTIAL/PLATELET
Abs Immature Granulocytes: 0.07 10*3/uL (ref 0.00–0.07)
Basophils Absolute: 0 10*3/uL (ref 0.0–0.1)
Basophils Relative: 1 %
Eosinophils Absolute: 0 10*3/uL (ref 0.0–0.5)
Eosinophils Relative: 1 %
HCT: 26.7 % — ABNORMAL LOW (ref 39.0–52.0)
Hemoglobin: 8.5 g/dL — ABNORMAL LOW (ref 13.0–17.0)
Immature Granulocytes: 1 %
Lymphocytes Relative: 14 %
Lymphs Abs: 0.8 10*3/uL (ref 0.7–4.0)
MCH: 23.4 pg — ABNORMAL LOW (ref 26.0–34.0)
MCHC: 31.8 g/dL (ref 30.0–36.0)
MCV: 73.4 fL — ABNORMAL LOW (ref 80.0–100.0)
Monocytes Absolute: 0.9 10*3/uL (ref 0.1–1.0)
Monocytes Relative: 15 %
Neutro Abs: 3.8 10*3/uL (ref 1.7–7.7)
Neutrophils Relative %: 68 %
Platelets: 438 10*3/uL — ABNORMAL HIGH (ref 150–400)
RBC: 3.64 MIL/uL — ABNORMAL LOW (ref 4.22–5.81)
RDW: 19.4 % — ABNORMAL HIGH (ref 11.5–15.5)
WBC: 5.6 10*3/uL (ref 4.0–10.5)
nRBC: 0 % (ref 0.0–0.2)

## 2020-04-11 LAB — BASIC METABOLIC PANEL
Anion gap: 12 (ref 5–15)
BUN: 12 mg/dL (ref 8–23)
CO2: 29 mmol/L (ref 22–32)
Calcium: 8.8 mg/dL — ABNORMAL LOW (ref 8.9–10.3)
Chloride: 91 mmol/L — ABNORMAL LOW (ref 98–111)
Creatinine, Ser: 0.47 mg/dL — ABNORMAL LOW (ref 0.61–1.24)
GFR, Estimated: >60 mL/min (ref >60)
Glucose, Bld: 142 mg/dL — ABNORMAL HIGH (ref 70–99)
Potassium: 4.2 mmol/L (ref 3.5–5.1)
Sodium: 132 mmol/L — ABNORMAL LOW (ref 135–145)

## 2020-04-11 LAB — PHOSPHORUS: Phosphorus: 3.8 mg/dL (ref 2.5–4.6)

## 2020-04-11 LAB — MAGNESIUM: Magnesium: 1.3 mg/dL — ABNORMAL LOW (ref 1.7–2.4)

## 2020-04-11 MED ORDER — MAGNESIUM SULFATE 4 GM/100ML IV SOLN
4.0000 g | Freq: Once | INTRAVENOUS | Status: AC
  Administered 2020-04-11: 4 g via INTRAVENOUS
  Filled 2020-04-11: qty 100

## 2020-04-11 NOTE — Progress Notes (Signed)
PROGRESS NOTE    Devon Peterson   [id number]  DOB: 1955/08/11  DOA: 03/19/2020     23  PCP: Patient, No Pcp Per  CC: SOB  Hospital Course: Devon Peterson is a 65 y.o. 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 fib, aspiration pneumonia.  Patient presented to ED on 1/15 with complaint of shortness of breath.   May 2021, diagnosed with laryngeal cancer, underwent tracheostomy. 07/23/2019 -08/21/2019 , patient underwent radiation to larynx.   08/26/2019, tracheostomy was decannulated. Also in May 2021, CT scan of chest showed 2.7 cm mass in the left lower lobe.  Bronchoscopy was nondiagnostic.   02/11/2020, CT-guided biopsy of LLL mass showed squamous cell carcinoma.  Underwent 4 sessions of radiation till 03/15/2020 03/10/2020-03/18/2020, patient was admitted for stridor and dyspnea.  He was seen by ENT and recommended tracheostomy which he refused and has since discharged to SNF. 03/19/2020, patient presented to the ED again with dyspnea, started on hypoxia.  Patient was seen by ENT and underwent tracheostomy placement. 03/18/2020-oncology Dr. Benay Spice recommended hospice. 1/30, PEG tube clogged.  Tube feeding held. 1/31, PEG tube replaced by IR.  Tube feeding resumed.     Currently pending placement.  Difficult disposition because of new tracheostomy status.   Interval History:  No events overnight.  Hospital day 23 currently.  Resting comfortably in usual state. No pain or SOB. He declined needing to write on notepad to talk to me.   Old records reviewed in assessment of this patient  ROS: Constitutional: negative for chills and fevers, Respiratory: negative for cough, Cardiovascular: negative for chest pain and Gastrointestinal: negative for abdominal pain  Assessment & Plan: * Acute on chronic respiratory failure with hypoxia (HCC) Timeline of events as above. Underwent  tracheostomy by ENT this admission. -Currently on 4 to 5 L oxygen through trach collar. -Outpatient follow-up with ENT Dr. Wilburn Cornelia ENT for tracheostomy care. -Speech therapy following forPassy-Muir valve. -Continue intermittent suctioning.  Goals of care, counseling/discussion Extensive discussion with family and patient regarding goals of care. -Currently full code. -Patient was interested in home hospice but unable to go home due to poor home condition. -Plan is to discharge him to SNF, treat what is treatable and follow-up and establish care with palliative care.  But he is a new tracheostomy and is a difficult placement.  Malignant neoplasm of lower lobe of left lung ([place]) -Seen by oncology and radiation oncology. Underwent radiation for laryngeal cancer and squamous cell lung cancer in the past.   -Per last note from oncology on 1/14, patient is a poor candidate for any systemic chemotherapy. Hospice was recommended. -Continue pain control with fentanyl patch, IV Dilaudid as needed oral oxycodone as needed.  Sepsis ([place]) - due to aspiration pna on admission - now s/p abx course  Severe protein-calorie malnutrition ([place]) - Patient's BMI is Body mass index is 9.34 kg/m.. - Patient has the following signs/symptoms consistent with PCM: (fat loss, muscle loss, muscle wasting, cachexia). - seen by RD, appreciate assistance. Continue plan per RD to include ongoing TF and we will continue electrolyte replacement as needed  Aspiration pneumonia (HCC)-resolved as of 04/07/2020 - abx course completed  Normocytic anemia -No active bleeding but gradually downtrending hemoglobin level.  - intermittent monitoring   PAF (paroxysmal atrial fibrillation) (HCC) -Rate intermittently not controlled. -Not on any anticoagulation. -Continue current medication.  PEG tube malfunction ([place]) 1/30, noted to have a  clogged tube.  It was replaced by IR the next day 1/31.  Tube feeding resumed  COPD  (chronic obstructive pulmonary disease) (HCC) - continue inhalers  Radiation pneumonitis (HCC)-resolved as of 04/07/2020 - s/p abx course  Laryngeal stridor-resolved as of 04/07/2020 - s/p trach placement   Antimicrobials:   DVT prophylaxis: enoxaparin (LOVENOX) injection 40 mg Start: 03/21/20 1600 Place and maintain sequential compression device Start: 03/19/20 0810   Code Status:   Code Status: Full Code Family Communication: none present  Disposition Plan: Status is: Inpatient  Remains inpatient appropriate because:Unsafe d/c plan, Inpatient level of care appropriate due to severity of illness and Waiting until 30 days of trach prior to being able to be discharged   Dispo: The patient is from: [facility]              Anticipated d/c is to: [place]              Anticipated d/c date is: > 3 days              Patient currently is not medically stable to d/c.   Difficult to place patient Yes  Risk of unplanned readmission score: Unplanned Admission- Pilot do not use: 38.45   Objective: Blood pressure 108/80, pulse (!) 108, temperature 98.6 F (37 C), resp. rate 16, height 5\' 11"  (1.803 m), weight 30.4 kg, SpO2 99 %.  Examination: General appearance: alert and no distress Head: Normocephalic, without obvious abnormality, atraumatic  Neck: trach in place Eyes: EOMI Lungs: clear to auscultation bilaterally Heart: regular rate and rhythm and S1, S2 normal Abdomen: PEG in place. NT, ND, BS present Extremities: no edema, thin Skin: diffuse xerosis Neurologic: follows commands, moves all 4 extremities  Consultants:     Procedures:     Data Reviewed: I have personally reviewed following labs and imaging studies Results for orders placed or performed during the hospital encounter of 03/19/20 (from the past 24 hour(s))  Glucose, capillary     Status: Abnormal   Collection Time: 04/10/20  5:48 PM  Result Value Ref Range   Glucose-Capillary 105 (H) 70 - 99 mg/dL   Glucose, capillary     Status: Abnormal   Collection Time: 04/10/20 11:41 PM  Result Value Ref Range   Glucose-Capillary 132 (H) 70 - 99 mg/dL  Basic metabolic panel     Status: Abnormal   Collection Time: 04/11/20  2:56 AM  Result Value Ref Range   Sodium 132 (L) 135 - 145 mmol/L   Potassium 4.2 3.5 - 5.1 mmol/L   Chloride 91 (L) 98 - 111 mmol/L   CO2 29 22 - 32 mmol/L   Glucose, Bld 142 (H) 70 - 99 mg/dL   BUN 12 8 - 23 mg/dL   Creatinine, Ser 0.47 (L) 0.61 - 1.24 mg/dL   Calcium 8.8 (L) 8.9 - 10.3 mg/dL   GFR, Estimated >60 >60 mL/min   Anion gap 12 5 - 15  CBC with Differential/Platelet     Status: Abnormal   Collection Time: 04/11/20  2:56 AM  Result Value Ref Range   WBC 5.6 4.0 - 10.5 K/uL   RBC 3.64 (L) 4.22 - 5.81 MIL/uL   Hemoglobin 8.5 (L) 13.0 - 17.0 g/dL   HCT 26.7 (L) 39.0 - 52.0 %   MCV 73.4 (L) 80.0 - 100.0 fL   MCH 23.4 (L) 26.0 - 34.0 pg   MCHC 31.8 30.0 - 36.0 g/dL   RDW 19.4 (H) 11.5 - 15.5 %  Platelets 438 (H) 150 - 400 K/uL   nRBC 0.0 0.0 - 0.2 %   Neutrophils Relative % 68 %   Neutro Abs 3.8 1.7 - 7.7 K/uL   Lymphocytes Relative 14 %   Lymphs Abs 0.8 0.7 - 4.0 K/uL   Monocytes Relative 15 %   Monocytes Absolute 0.9 0.1 - 1.0 K/uL   Eosinophils Relative 1 %   Eosinophils Absolute 0.0 0.0 - 0.5 K/uL   Basophils Relative 1 %   Basophils Absolute 0.0 0.0 - 0.1 K/uL   Immature Granulocytes 1 %   Abs Immature Granulocytes 0.07 0.00 - 0.07 K/uL  Magnesium     Status: Abnormal   Collection Time: 04/11/20  2:56 AM  Result Value Ref Range   Magnesium 1.3 (L) 1.7 - 2.4 mg/dL  Phosphorus     Status: None   Collection Time: 04/11/20  2:56 AM  Result Value Ref Range   Phosphorus 3.8 2.5 - 4.6 mg/dL  Glucose, capillary     Status: Abnormal   Collection Time: 04/11/20  5:46 AM  Result Value Ref Range   Glucose-Capillary 121 (H) 70 - 99 mg/dL  Glucose, capillary     Status: None   Collection Time: 04/11/20 11:12 AM  Result Value Ref Range    Glucose-Capillary 98 70 - 99 mg/dL    No results found for this or any previous visit (from the past 240 hour(s)).   Radiology Studies: No results found. IR REPLACE G-TUBE COMPLEX WO FLUORO (TRACT REV)  Final Result    DG Swallowing Func-Speech Pathology  Final Result    DG CHEST PORT 1 VIEW  Final Result    DG CHEST PORT 1 VIEW  Final Result    DG Abd Portable 1V  Final Result    DG Chest Port 1 View  Final Result    DG Chest Portable 1 View  Final Result      Scheduled Meds: . chlorhexidine  15 mL Mouth Rinse BID  . enoxaparin (LOVENOX) injection  40 mg Subcutaneous Q24H  . feeding supplement (PROSource TF)  45 mL Per Tube BID  . fentaNYL  1 patch Transdermal Q72H  . free water  100 mL Per Tube Q8H  . magnesium oxide  800 mg Per Tube Daily  . mouth rinse  15 mL Mouth Rinse q12n4p  . pantoprazole sodium  40 mg Per Tube q morning - 10a  . polyethylene glycol  17 g Per Tube Daily  . senna  2 tablet Per Tube Daily  . sertraline  50 mg Per Tube Daily  . thiamine  100 mg Per Tube Daily   PRN Meds: ipratropium-albuterol, LORazepam, oxyCODONE Continuous Infusions: . sodium chloride 10 mL/hr at 04/09/20 1745  . feeding supplement (JEVITY 1.5 CAL/FIBER) 1,000 mL (04/11/20 1018)     LOS: 23 days  Time spent: Greater than 50% of the 35 minute visit was spent in counseling/coordination of care for the patient as laid out in the A&P.   Dwyane Dee, MD [facility] 04/11/2020, 1:04 PM

## 2020-04-12 DIAGNOSIS — J9621 Acute and chronic respiratory failure with hypoxia: Secondary | ICD-10-CM | POA: Diagnosis not present

## 2020-04-12 LAB — BASIC METABOLIC PANEL
Anion gap: 11 (ref 5–15)
BUN: 15 mg/dL (ref 8–23)
CO2: 30 mmol/L (ref 22–32)
Calcium: 9.1 mg/dL (ref 8.9–10.3)
Chloride: 93 mmol/L — ABNORMAL LOW (ref 98–111)
Creatinine, Ser: 0.59 mg/dL — ABNORMAL LOW (ref 0.61–1.24)
GFR, Estimated: >60 mL/min (ref >60)
Glucose, Bld: 140 mg/dL — ABNORMAL HIGH (ref 70–99)
Potassium: 4.3 mmol/L (ref 3.5–5.1)
Sodium: 134 mmol/L — ABNORMAL LOW (ref 135–145)

## 2020-04-12 LAB — CBC WITH DIFFERENTIAL/PLATELET
Abs Immature Granulocytes: 0.04 10*3/uL (ref 0.00–0.07)
Basophils Absolute: 0 10*3/uL (ref 0.0–0.1)
Basophils Relative: 0 %
Eosinophils Absolute: 0 10*3/uL (ref 0.0–0.5)
Eosinophils Relative: 0 %
HCT: 27 % — ABNORMAL LOW (ref 39.0–52.0)
Hemoglobin: 8.4 g/dL — ABNORMAL LOW (ref 13.0–17.0)
Immature Granulocytes: 1 %
Lymphocytes Relative: 17 %
Lymphs Abs: 1 10*3/uL (ref 0.7–4.0)
MCH: 23.3 pg — ABNORMAL LOW (ref 26.0–34.0)
MCHC: 31.1 g/dL (ref 30.0–36.0)
MCV: 74.8 fL — ABNORMAL LOW (ref 80.0–100.0)
Monocytes Absolute: 1 10*3/uL (ref 0.1–1.0)
Monocytes Relative: 18 %
Neutro Abs: 3.8 10*3/uL (ref 1.7–7.7)
Neutrophils Relative %: 64 %
Platelets: 475 10*3/uL — ABNORMAL HIGH (ref 150–400)
RBC: 3.61 MIL/uL — ABNORMAL LOW (ref 4.22–5.81)
RDW: 19.6 % — ABNORMAL HIGH (ref 11.5–15.5)
WBC: 5.9 10*3/uL (ref 4.0–10.5)
nRBC: 0.3 % — ABNORMAL HIGH (ref 0.0–0.2)

## 2020-04-12 LAB — GLUCOSE, CAPILLARY
Glucose-Capillary: 120 mg/dL — ABNORMAL HIGH (ref 70–99)
Glucose-Capillary: 120 mg/dL — ABNORMAL HIGH (ref 70–99)
Glucose-Capillary: 86 mg/dL (ref 70–99)
Glucose-Capillary: 91 mg/dL (ref 70–99)

## 2020-04-12 LAB — MAGNESIUM: Magnesium: 1.8 mg/dL (ref 1.7–2.4)

## 2020-04-12 LAB — PHOSPHORUS: Phosphorus: 3 mg/dL (ref 2.5–4.6)

## 2020-04-12 MED ORDER — JEVITY 1.5 CAL/FIBER PO LIQD
1000.0000 mL | ORAL | Status: DC
  Administered 2020-04-13 – 2020-04-20 (×8): 1000 mL
  Filled 2020-04-12 (×16): qty 1000

## 2020-04-12 MED ORDER — FREE WATER
100.0000 mL | Freq: Four times a day (QID) | Status: DC
  Administered 2020-04-12 – 2020-04-21 (×37): 100 mL

## 2020-04-12 NOTE — Plan of Care (Signed)
  Problem: Clinical Measurements: Goal: Diagnostic test results will improve Outcome: Progressing   Problem: Activity: Goal: Risk for activity intolerance will decrease Outcome: Progressing   Problem: Nutrition: Goal: Adequate nutrition will be maintained Outcome: Progressing   Problem: Activity: Goal: Ability to tolerate increased activity will improve Outcome: Progressing   Problem: Health Behavior/Discharge Planning: Goal: Ability to manage tracheostomy will improve Outcome: Progressing   Problem: Role Relationship: Goal: Ability to communicate will improve Outcome: Progressing

## 2020-04-12 NOTE — Progress Notes (Signed)
Nutrition Follow-up  DOCUMENTATION CODES:   Underweight,Severe malnutrition in context of chronic illness  INTERVENTION:  - will adjust TF regimen: Jevity 1.5 @ 65 ml/hr with 45 ml Prosource TF (or equivalent) BID and 100 ml free water QID.  - this regimen will provide 2420 kcal, 121 grams protein, and 1586 ml free water.    NUTRITION DIAGNOSIS:   Severe Malnutrition related to chronic illness,cancer and cancer related treatments as evidenced by severe fat depletion,severe muscle depletion. -ongoing  GOAL:   Patient will meet greater than or equal to 90% of their needs -met with TF regimen  MONITOR:   Labs,Weight trends,I & O's,TF tolerance  ASSESSMENT:   65 yo male former smoker with laryngeal cancer (poorly differentiated squamous cell) presented to ER with dyspnea, stridor and hypoxia.  Patient sleeping at the time of visit with no family or visitors present. Able to talk with RN who reports patient has ongoing soft/loose stools but is otherwise tolerating TF regimen without any issue.   Patient on trach collar and has PEG in place. Via PEG he is receiving Jevity 1.5 @ 60 ml/hr with 45 ml Prosource TF BID and 100 ml free water TID. This regimen is providing 2240 kcal, 114 grams protein, and 1394 ml free water.   Weight has been fluctuating throughout hospitalization. No information available in the edema section of flow sheet.   MD note from this AM states that plan at the time of d/c is for SNF once a bed is available.     Labs reviewed; CBGs: 120 x2 mg/dl, Na: 134 mmol/l, Cl: 93 mmol/l, creatinine: 0.59 mg/dl. Medications reviewed; 800 mg mag-ox/day, 4 g IV Mg sulfate x1 run 2/7, 40 mg protonix per PEG/day, 17 g miralax/day, 2 tablets senokot/day, 100 mg thiamine per PEG/day.    Diet Order:   Diet Order            Diet NPO time specified Except for: Ice Chips  Diet effective now                 EDUCATION NEEDS:   No education needs have been identified at this  time  Skin:  Skin Assessment: Skin Integrity Issues: Skin Integrity Issues:: Incisions Incisions: closed neck s/p trach  Last BM:  2/8 (type 6 x1)  Height:   Ht Readings from Last 1 Encounters:  03/19/20 $RemoveB'5\' 11"'[id number]$  (1.803 m)    Weight:   Wt Readings from Last 1 Encounters:  04/12/20 51.3 kg     Estimated Nutritional Needs:  Kcal:  2000-2200 Protein:  110-125 grams Fluid:  > 2 L     Jarome Matin, MS, RD, LDN, CNSC Inpatient Clinical Dietitian RD pager # available in [place]  After hours/weekend pager # available in [facility]

## 2020-04-12 NOTE — Progress Notes (Signed)
PROGRESS NOTE    Devon Peterson  [id number] DOB: [birth year] DOA: 03/19/2020 PCP: Patient, No Pcp Per   Chief Complaint  Patient presents with  . Respiratory Distress  Brief Narrative: 65 year old male with history of COPD, cocaine abuse, tobacco abuse, laryngeal cancer discharged with radiation therapy, squamous cell lung cancer, A. fib, aspiration pneumonia presented to the ED on 1/15 with shortness of breath.  As per the report May 2021 diagnosed with laryngeal lung cancer underwent tracheostomy. 07/23/2019 -08/21/2019 , patient underwent radiation to larynx.  08/26/2019, tracheostomy was decannulated. Also in May 2021, CT scan of chest showed 2.7 cm mass in the left lower lobe. Bronchoscopy was nondiagnostic.  02/11/2020, CT-guided biopsy of LLL mass showed squamous cell carcinoma. Underwent 4 sessions of radiation till 03/15/2020 03/10/2020-03/18/2020, patient was admitted for stridor and dyspnea. He was seen by ENT and recommended tracheostomy which he refused and has since discharged to SNF. 03/19/2020, patient presented to the ED again with dyspnea, started on hypoxia. Patient was seen by ENT and underwent tracheostomy placement. 03/18/2020-oncology Dr. Benay Spice recommended hospice. 1/30, PEG tube clogged. Tube feeding held. 1/31,PEG tube replaced by IR. Tube feeding resumed.  At this time patient is awaiting on placement due to new tracheostomy status and difficult placement.  Subjective: Seen and examined this morning. On trach collar with 5 L he is alert awake follows commands appropriately.   Assessment & Plan:  Acute on chronic respiratory failure with hypoxia, status post tracheostomy by ENT followed by Dr. Wilburn Cornelia for carotid extremity care and will need outpatient follow-up.  Speech following, continue to suctioning, nasal cannula oxygen to maintain saturation at least 94%  Goals of care/counseling/discussion: Previously had an extensive discussion with patient and  family and patient remains full code, patient was interested in home hospice but unable to go home due to poor home condition and plan is for discharge to skilled nursing facility due to what is treatable and follow-up and establish care with palliative care as outpatient.  Squamous cell lung cancer, underwent radiation, as per the his oncology note patient is a poor candidate for any systemic chemotherapy, hospice was recommended  Laryngeal cancer underwent radiation, now with tracheostomy  Sepsis/aspiration pneumonia POA resolved,completed antibiotics  Normocytic anemia monitor hemoglobin intermittently and transfuse if less than 7 g Recent Labs  Lab 04/08/20 0311 04/09/20 0314 04/10/20 0312 04/11/20 0256 04/12/20 0318  HGB 7.6* 7.4* 8.0* 8.5* 8.4*  HCT 24.7* 23.6* 25.6* 26.7* 27.0*   PAF HX, EKG 1/15 showed sinus tachycardia, not on anticoagulation, not needing rate controlling agents  S/P PE 1/30-had clogged tube replaced by IR on 1/31, continue to feeding as tolerated  COPD not in exacerbation-continue inhalers  Laryngeal stridor:resolved on 2/3  Radiation pneumonitis resolved.  Complete antibiotics  Severe protein calorie malnutrition followed by dietitian continue to feeding, see plan below BMI severely low at 15  Nutrition: Diet Order            Diet NPO time specified Except for: Ice Chips  Diet effective now                 Nutrition Problem: Severe Malnutrition Etiology: chronic illness,cancer and cancer related treatments Signs/Symptoms: severe fat depletion,severe muscle depletion Interventions: Tube feeding,Prostat Pt's Body mass index is 15.76 kg/m.  DVT prophylaxis: enoxaparin (LOVENOX) injection 40 mg Start: 03/21/20 1600 Place and maintain sequential compression device Start: 03/19/20 0810 Code Status:   Code Status: Full Code  Family Communication: plan of care discussed with patient at bedside. Discussed  with the nursing staff. Status is:  Inpatient Remains inpatient appropriate because:Unsafe d/c plan and Inpatient level of care appropriate due to severity of illness  Dispo: The patient is from: Home              Anticipated d/c is to: SNF              Anticipated d/c date is: once bed avialble              Patient currently is medically stable to d/c.   Difficult to place patient Yes  Consultants:see note  Procedures:see note  Culture/Microbiology    Component Value Date/Time   SDES  03/19/2020 0700    BLOOD RIGHT ANTECUBITAL Performed at [facility], [place] [address] [place], [place] [address]    [place]  03/19/2020 0700    BOTTLES DRAWN AEROBIC AND ANAEROBIC Blood Culture results may not be optimal due to an excessive volume of blood received in culture bottles Performed at [facility], [place] [address], [place], [place] [address]    CULT  03/19/2020 0700    NO GROWTH 5 DAYS Performed at [facility] [address], [place], [place] [address]    REPTSTATUS 03/24/2020 FINAL 03/19/2020 0700    Other culture-see note  Medications: Scheduled Meds: . chlorhexidine  15 mL Mouth Rinse BID  . enoxaparin (LOVENOX) injection  40 mg Subcutaneous Q24H  . feeding supplement (PROSource TF)  45 mL Per Tube BID  . fentaNYL  1 patch Transdermal Q72H  . free water  100 mL Per Tube Q8H  . magnesium oxide  800 mg Per Tube Daily  . mouth rinse  15 mL Mouth Rinse q12n4p  . pantoprazole sodium  40 mg Per Tube q morning  . polyethylene glycol  17 g Per Tube Daily  . senna  2 tablet Per Tube Daily  . sertraline  50 mg Per Tube Daily  . thiamine  100 mg Per Tube Daily   Continuous Infusions: . sodium chloride 10 mL/hr at 04/11/20 2200  . feeding supplement (JEVITY 1.5 CAL/FIBER) 1,000 mL (04/11/20 1018)    Antimicrobials: Anti-infectives (From admission, onward)   Start     Dose/Rate Route Frequency Ordered Stop   03/19/20 1213  piperacillin-tazobactam (ZOSYN) 3.375  GM/50ML IVPB       Note to Pharmacy: Estell Harpin   : cabinet override      03/19/20 1213 03/20/20 0014   03/19/20 1000  piperacillin-tazobactam (ZOSYN) IVPB 3.375 g  Status:  Discontinued        3.375 g 12.5 mL/hr over 240 Minutes Intravenous Every 8 hours 03/19/20 0852 03/23/20 1300     Objective: Vitals: Today's Vitals   04/11/20 2117 04/12/20 0500 04/12/20 0522 04/12/20 0708  BP:   100/78   Pulse:   100   Resp:   18   Temp:   98.2 F (36.8 C)   TempSrc:      SpO2:   100%   Weight:  51.3 kg    Height:      PainSc: 0-No pain   0-No pain    Intake/Output Summary (Last 24 hours) at 04/12/2020 0740 Last data filed at 04/12/2020 0603 Gross per 24 hour  Intake 1470.01 ml  Output 1950 ml  Net -479.99 ml   Filed Weights   04/07/20 0432 04/08/20 0500 04/12/20 0500  Weight: 30.4 kg 30.4 kg 51.3 kg   Weight change:   Intake/Output from previous day: 02/07  0701 - 02/08 0700 In: 6644 [P.O.:90; I.V.:260; NG/GT:1020; IV Piggyback:100] Out: 1950 [IHKVQ:2595] Intake/Output this shift: No intake/output data recorded. Filed Weights   04/07/20 0432 04/08/20 0500 04/12/20 0500  Weight: 30.4 kg 30.4 kg 51.3 kg    Examination: General exam:AA,follows commands, thin frail cachectic. HEENT:Oral mucosa moist,Ear/Nose WNL grossly,dentition normal. Respiratory system: bilaterally clear,, trach collar in place. Cardiovascular system: S1 & S2 +, regular, No JVD. Gastrointestinal system: Abdomen soft, PEG+, bs+ Nervous System:Alert, awake, moving extremities , following commands. Extremities: No edema, distal peripheral pulses palpable.  Skin: No rashes,no icterus. MSK: Normal muscle bulk,tone, power  Data Reviewed: I have personally reviewed following labs and imaging studies CBC: Recent Labs  Lab 04/08/20 0311 04/09/20 0314 04/10/20 0312 04/11/20 0256 04/12/20 0318  WBC 4.1 5.3 6.4 5.6 5.9  NEUTROABS 2.7 3.6 4.2 3.8 3.8  HGB 7.6* 7.4* 8.0* 8.5* 8.4*  HCT 24.7* 23.6* 25.6*  26.7* 27.0*  MCV 76.5* 74.7* 73.8* 73.4* 74.8*  PLT 347 359 418* 438* 638*   Basic Metabolic Panel: Recent Labs  Lab 04/08/20 0311 04/09/20 0314 04/10/20 0312 04/11/20 0256 04/12/20 0318  NA 137 130* 134* 132* 134*  K 3.4* 3.9 4.3 4.2 4.3  CL 98 96* 94* 91* 93*  CO2 28 27 28 29 30   GLUCOSE 135* 140* 136* 142* 140*  BUN 9 10 10 12 15   CREATININE 0.43* 0.39* 0.37* 0.47* 0.59*  CALCIUM 8.4* 8.3* 8.9 8.8* 9.1  MG 1.3* 1.4* 1.4* 1.3* 1.8  PHOS  --   --  3.0 3.8 3.0   GFR: Estimated Creatinine Clearance: 67.7 mL/min (A) (by C-G formula based on SCr of 0.59 mg/dL (L)). Liver Function Tests: No results for input(s): AST, ALT, ALKPHOS, BILITOT, PROT, ALBUMIN in the last 168 hours. No results for input(s): LIPASE, AMYLASE in the last 168 hours. No results for input(s): AMMONIA in the last 168 hours. Coagulation Profile: No results for input(s): INR, PROTIME in the last 168 hours. Cardiac Enzymes: No results for input(s): CKTOTAL, CKMB, CKMBINDEX, TROPONINI in the last 168 hours. BNP (last 3 results) No results for input(s): PROBNP in the last 8760 hours. HbA1C: No results for input(s): HGBA1C in the last 72 hours. CBG: Recent Labs  Lab 04/11/20 0546 04/11/20 1112 04/11/20 1712 04/12/20 0006 04/12/20 0600  GLUCAP 121* 98 118* 120* 120*   Lipid Profile: No results for input(s): CHOL, HDL, LDLCALC, TRIG, CHOLHDL, LDLDIRECT in the last 72 hours. Thyroid Function Tests: No results for input(s): TSH, T4TOTAL, FREET4, T3FREE, THYROIDAB in the last 72 hours. Anemia Panel: No results for input(s): VITAMINB12, FOLATE, FERRITIN, TIBC, IRON, RETICCTPCT in the last 72 hours. Sepsis Labs: No results for input(s): PROCALCITON, LATICACIDVEN in the last 168 hours.  No results found for this or any previous visit (from the past 240 hour(s)).   Radiology Studies: No results found.   LOS: 24 days   Antonieta Pert, MD [facility]  04/12/2020, 7:40 AM

## 2020-04-13 DIAGNOSIS — J9621 Acute and chronic respiratory failure with hypoxia: Secondary | ICD-10-CM | POA: Diagnosis not present

## 2020-04-13 LAB — GLUCOSE, CAPILLARY
Glucose-Capillary: 113 mg/dL — ABNORMAL HIGH (ref 70–99)
Glucose-Capillary: 120 mg/dL — ABNORMAL HIGH (ref 70–99)
Glucose-Capillary: 122 mg/dL — ABNORMAL HIGH (ref 70–99)
Glucose-Capillary: 131 mg/dL — ABNORMAL HIGH (ref 70–99)

## 2020-04-13 LAB — PHOSPHORUS: Phosphorus: 3.6 mg/dL (ref 2.5–4.6)

## 2020-04-13 NOTE — TOC Progression Note (Signed)
Transition of Care [facility]) - Progression Note    Patient Details  Name: Devon Peterson MRN: [id number] Date of Birth: 1955/06/13  Transition of Care [facility]) CM/SW Contact  Lennart Pall, LCSW Phone Number: 04/13/2020, 4:46 PM  Clinical Narrative:    In anticipation that pt's trach will be 30 days post placement next week, have re-sent information out to area SNFs to begin bed search.     Expected Discharge Plan: Skilled Nursing Facility Barriers to Discharge: Other (comment) (SNF pending trach >= 30 days post placement)  Expected Discharge Plan and Services Expected Discharge Plan: [place] In-house Referral: Clinical Social Work     Living arrangements for the past 2 months: [place] ([place] since 08/2019) Expected Discharge Date:  (unknown)               DME Arranged: N/A DME Agency: NA                   Social Determinants of Health ([place]) Interventions    Readmission Risk Interventions Readmission Risk Prevention Plan 07/27/2019  Transportation Screening Complete  HRI or [place] Complete  Social Work Consult for [place] Planning/Counseling Complete  Palliative Care Screening Complete  Medication Review [employer]) Complete  Some recent data might be hidden

## 2020-04-13 NOTE — Progress Notes (Signed)
PROGRESS NOTE    Devon Peterson  [id number] DOB: November 25, 1955 DOA: 03/19/2020 PCP: Patient, No Pcp Per   Chief Complaint  Patient presents with  . Respiratory Distress  Brief Narrative: 65 year old male with history of COPD, cocaine abuse, tobacco abuse, laryngeal cancer discharged with radiation therapy, squamous cell lung cancer, A. fib, aspiration pneumonia presented to the ED on 1/15 with shortness of breath.  As per the report May 2021 diagnosed with laryngeal lung cancer underwent tracheostomy. 07/23/2019 -08/21/2019 , patient underwent radiation to larynx.  08/26/2019, tracheostomy was decannulated. Also in May 2021, CT scan of chest showed 2.7 cm mass in the left lower lobe. Bronchoscopy was nondiagnostic.  02/11/2020, CT-guided biopsy of LLL mass showed squamous cell carcinoma. Underwent 4 sessions of radiation till 03/15/2020 03/10/2020-03/18/2020, patient was admitted for stridor and dyspnea. He was seen by ENT and recommended tracheostomy which he refused and has since discharged to SNF. 03/19/2020, patient presented to the ED again with dyspnea, started on hypoxia. Patient was seen by ENT and underwent tracheostomy placement. 03/18/2020-oncology Dr. Benay Spice recommended hospice. 1/30, PEG tube clogged. Tube feeding held. 1/31,PEG tube replaced by IR. Tube feeding resumed.  At this time patient is awaiting on placement due to new tracheostomy status and difficult placement.  Subjective: Seen and examined this morning.  Patient is alert awake follows commands appropriately, able to move all his extremities.  No new complaints. On trach collar with 5 L he is alert awake follows commands appropriately.   Assessment & Plan:  Acute on chronic respiratory failure with hypoxia, s/p emergent tracheostomy by ENT followed by Dr. Starr Sinclair need outpatient follow-up.  Speech following, continue to suctioning, nasal cannula oxygen to maintain saturation at least 94%. Cont  ptot  Goals of care/counseling/discussion: Previously had an extensive discussion with patient and family and patient remains full code, patient was interested in home hospice but unable to go home due to poor home condition and plan is for discharge to skilled nursing facility and treat what is treatable and follow-up and establish care with palliative care as outpatient.  Squamous cell lung cancer, underwent radiation, as per the his oncology note patient is a poor candidate for any systemic chemotherapy, hospice was recommended.  Outpatient follow-up with his oncology team  Laryngeal cancer underwent radiation, now with tracheostomy.  Continue trach care  Sepsis/aspiration pneumonia POA resolved,completed antibiotics.  Patient is afebrile.  Normocytic anemia monitor hemoglobin intermittently and transfuse if less than 7 g Recent Labs  Lab 04/08/20 0311 04/09/20 0314 04/10/20 0312 04/11/20 0256 04/12/20 0318  HGB 7.6* 7.4* 8.0* 8.5* 8.4*  HCT 24.7* 23.6* 25.6* 26.7* 27.0*   PAF HX, EKG 1/15 showed sinus tachycardia, not on anticoagulation, not needing rate controlling agents  S/P PE 1/30-had clogged tube replaced by IR on 1/31, continue to feeding as tolerated  COPD not in exacerbation-continue inhalers  Laryngeal stridor:resolved on 2/3  Radiation pneumonitis resolved.  Complete antibiotics  Severe protein calorie malnutrition w/ bmi 16, appreciate dietitian input, continue to augment diet with tube feeding, pro start supplement   Nutrition: Diet Order            Diet NPO time specified Except for: Ice Chips  Diet effective now                 Nutrition Problem: Severe Malnutrition Etiology: chronic illness,cancer and cancer related treatments Signs/Symptoms: severe fat depletion,severe muscle depletion Interventions: Tube feeding,Prostat Pt's Body mass index is 16.6 kg/m.  DVT prophylaxis: enoxaparin (LOVENOX) injection 40 mg  Start: 03/21/20 1600 Place and  maintain sequential compression device Start: 03/19/20 0810 Code Status:   Code Status: Full Code  Family Communication: plan of care discussed with patient at bedside. Discussed with the nursing staff. Status is: Inpatient Remains inpatient appropriate because:Unsafe d/c plan and Inpatient level of care appropriate due to severity of illness  Dispo: The patient is from: Home              Anticipated d/c is to: SNF              Anticipated d/c date is: once bed avialble              Patient currently is medically stable to d/c.   Difficult to place patient Yes  Consultants:see note  Procedures:see note  Culture/Microbiology    Component Value Date/Time   SDES  03/19/2020 0700    BLOOD RIGHT ANTECUBITAL Performed at [facility], [place] [address], [place], [place] [address]    [place]  03/19/2020 0700    BOTTLES DRAWN AEROBIC AND ANAEROBIC Blood Culture results may not be optimal due to an excessive volume of blood received in culture bottles Performed at [facility], [place] [address], [place], [place] [address]    CULT  03/19/2020 0700    NO GROWTH 5 DAYS Performed at [place] [address], [place], [place] [address]    REPTSTATUS 03/24/2020 FINAL 03/19/2020 0700    Other culture-see note  Medications: Scheduled Meds: . chlorhexidine  15 mL Mouth Rinse BID  . enoxaparin (LOVENOX) injection  40 mg Subcutaneous Q24H  . feeding supplement (PROSource TF)  45 mL Per Tube BID  . fentaNYL  1 patch Transdermal Q72H  . free water  100 mL Per Tube Q6H  . magnesium oxide  800 mg Per Tube Daily  . mouth rinse  15 mL Mouth Rinse q12n4p  . pantoprazole sodium  40 mg Per Tube q morning  . polyethylene glycol  17 g Per Tube Daily  . senna  2 tablet Per Tube Daily  . sertraline  50 mg Per Tube Daily  . thiamine  100 mg Per Tube Daily   Continuous Infusions: . sodium chloride 10 mL/hr at 04/13/20 0224  . feeding supplement (JEVITY  1.5 CAL/FIBER) 1,000 mL (04/13/20 0444)    Antimicrobials: Anti-infectives (From admission, onward)   Start     Dose/Rate Route Frequency Ordered Stop   03/19/20 1213  piperacillin-tazobactam (ZOSYN) 3.375 GM/50ML IVPB       Note to Pharmacy: Estell Harpin   : cabinet override      03/19/20 1213 03/20/20 0014   03/19/20 1000  piperacillin-tazobactam (ZOSYN) IVPB 3.375 g  Status:  Discontinued        3.375 g 12.5 mL/hr over 240 Minutes Intravenous Every 8 hours 03/19/20 0852 03/23/20 1300     Objective: Vitals: Today's Vitals   04/13/20 0538 04/13/20 0613 04/13/20 0646 04/13/20 0800  BP:   104/75   Pulse:   94 95  Resp:   18 16  Temp:   99.1 F (37.3 C)   TempSrc:   Oral   SpO2:   100% 100%  Weight:      Height:      PainSc: 6  3       Intake/Output Summary (Last 24 hours) at 04/13/2020 1030 Last data filed at 04/13/2020 0648 Gross per 24 hour  Intake 1278.08 ml  Output 2000 ml  Net -721.92 ml  Filed Weights   04/08/20 0500 04/12/20 0500 04/13/20 0446  Weight: 30.4 kg 51.3 kg 54 kg   Weight change: 2.722 kg  Intake/Output from previous day: 02/08 0701 - 02/09 0700 In: 1293.1 [P.O.:60; I.V.:253.1; NG/GT:200] Out: 2400 [Urine:2400] Intake/Output this shift: No intake/output data recorded. Filed Weights   04/08/20 0500 04/12/20 0500 04/13/20 0446  Weight: 30.4 kg 51.3 kg 54 kg    Examination: General exam: AAO, follows commands appropriately, old for age, thin and cachectic HEENT:Oral mucosa moist, Ear/Nose WNL grossly, dentition normal. Respiratory system: bilaterally diminished. Trach collar+ Cardiovascular system: S1 & S2 +, No JVD,. Gastrointestinal system: Abdomen soft, NT,ND, BS+.peg+ Nervous System:Alert, awake, moving extremities and grossly nonfocal Extremities: No edema, distal peripheral pulses palpable.  Skin: No rashes,no icterus. MSK: Thin muscle mass ,weak   Data Reviewed: I have personally reviewed following labs and imaging  studies CBC: Recent Labs  Lab 04/08/20 0311 04/09/20 0314 04/10/20 0312 04/11/20 0256 04/12/20 0318  WBC 4.1 5.3 6.4 5.6 5.9  NEUTROABS 2.7 3.6 4.2 3.8 3.8  HGB 7.6* 7.4* 8.0* 8.5* 8.4*  HCT 24.7* 23.6* 25.6* 26.7* 27.0*  MCV 76.5* 74.7* 73.8* 73.4* 74.8*  PLT 347 359 418* 438* 250*   Basic Metabolic Panel: Recent Labs  Lab 04/08/20 0311 04/09/20 0314 04/10/20 0312 04/11/20 0256 04/12/20 0318 04/13/20 0332  NA 137 130* 134* 132* 134*  --   K 3.4* 3.9 4.3 4.2 4.3  --   CL 98 96* 94* 91* 93*  --   CO2 28 27 28 29 30   --   GLUCOSE 135* 140* 136* 142* 140*  --   BUN 9 10 10 12 15   --   CREATININE 0.43* 0.39* 0.37* 0.47* 0.59*  --   CALCIUM 8.4* 8.3* 8.9 8.8* 9.1  --   MG 1.3* 1.4* 1.4* 1.3* 1.8  --   PHOS  --   --  3.0 3.8 3.0 3.6   GFR: Estimated Creatinine Clearance: 71.3 mL/min (A) (by C-G formula based on SCr of 0.59 mg/dL (L)). Liver Function Tests: No results for input(s): AST, ALT, ALKPHOS, BILITOT, PROT, ALBUMIN in the last 168 hours. No results for input(s): LIPASE, AMYLASE in the last 168 hours. No results for input(s): AMMONIA in the last 168 hours. Coagulation Profile: No results for input(s): INR, PROTIME in the last 168 hours. Cardiac Enzymes: No results for input(s): CKTOTAL, CKMB, CKMBINDEX, TROPONINI in the last 168 hours. BNP (last 3 results) No results for input(s): PROBNP in the last 8760 hours. HbA1C: No results for input(s): HGBA1C in the last 72 hours. CBG: Recent Labs  Lab 04/12/20 0600 04/12/20 1159 04/12/20 1657 04/13/20 0000 04/13/20 0537  GLUCAP 120* 86 91 131* 120*   Lipid Profile: No results for input(s): CHOL, HDL, LDLCALC, TRIG, CHOLHDL, LDLDIRECT in the last 72 hours. Thyroid Function Tests: No results for input(s): TSH, T4TOTAL, FREET4, T3FREE, THYROIDAB in the last 72 hours. Anemia Panel: No results for input(s): VITAMINB12, FOLATE, FERRITIN, TIBC, IRON, RETICCTPCT in the last 72 hours. Sepsis Labs: No results for  input(s): PROCALCITON, LATICACIDVEN in the last 168 hours.  No results found for this or any previous visit (from the past 240 hour(s)).   Radiology Studies: No results found.   LOS: 25 days   Antonieta Pert, MD [facility]  04/13/2020, 10:30 AM

## 2020-04-13 NOTE — Progress Notes (Signed)
Physical Therapy Treatment Patient Details Name: Devon Peterson MRN: [id number] DOB: 1955-10-30 Today's Date: 04/13/2020    History of Present Illness 65 y.o. 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 fib, aspiration pneumonia.  Patient presented to ED on 1/15 with complaint of shortness of breath. Dx of respiratory failure.  Recent admission 1/6-1/14/22 for sepsis 2* PNA.    PT Comments    Pt in bed AxO x 3 required MAX encouragement but was willing to get OOB.  Pt is non verbal due to [facility] but can communicate on his white board.  Assisted OOB to recliner.   General bed mobility comments: with increased tike and use of rails a little extra assist to complete scooting using bed pad. General transfer comment: with max encouragement and assist to rise pt was able to stand from elevated bed an complete 1/4 pivot turn to recliner.  Uncontrolled decend due to fatigue. Positioned upright with multiple pillows to comfort.   Pt awaiting 30 day TRACH placement then D/C to SNF  Follow Up Recommendations  SNF     Equipment Recommendations  None recommended by PT    Recommendations for Other Services       Precautions / Restrictions Precautions Precautions: Fall Precaution Comments: currently on 5L O2 28% Trach collar, PEG NPO X ICE chips    Mobility  Bed Mobility Overal bed mobility: Needs Assistance Bed Mobility: Rolling;Supine to Sit           General bed mobility comments: with increased tike and use of rails a little extra assist to complete scooting using bed pad  Transfers Overall transfer level: Needs assistance Equipment used: Rolling walker (2 wheeled) Transfers: Sit to/from [facility] Sit to Stand: Min assist Stand pivot transfers: Min assist       General transfer comment: with max encouragement and assist to rise pt was able to stand from  elevated bed an complete 1/4 pivot turn to recliner.  Uncontrolled decend due to fatigue.  Ambulation/Gait             General Gait Details: transfers only due to weakness and brief ability to stand upright long enough to complete transfer   Stairs             Wheelchair Mobility    Modified Rankin (Stroke Patients Only)       Balance                                            Cognition Arousal/Alertness: Awake/alert Behavior During Therapy: WFL for tasks assessed/performed Overall Cognitive Status: Within Functional Limits for tasks assessed                                 General Comments: AxO x 3 following all directions just non verbal due to [facility] but does use a white board to communicate      Exercises      General Comments        Pertinent Vitals/Pain Pain Assessment: Faces Faces Pain Scale: Hurts a little bit Pain Location: general Pain Descriptors / Indicators: Grimacing Pain Intervention(s): Monitored during session;Repositioned    Home Living  Prior Function            PT Goals (current goals can now be found in the care plan section) Progress towards PT goals: Progressing toward goals    Frequency    Min 2X/week      PT Plan Current plan remains appropriate    Co-evaluation              AM-PAC PT "6 Clicks" Mobility   Outcome Measure  Help needed turning from your back to your side while in a flat bed without using bedrails?: A Little Help needed moving from lying on your back to sitting on the side of a flat bed without using bedrails?: A Little Help needed moving to and from a bed to a chair (including a wheelchair)?: A Lot Help needed standing up from a chair using your arms (e.g., wheelchair or bedside chair)?: A Lot Help needed to walk in hospital room?: Total Help needed climbing 3-5 steps with a railing? : Total 6 Click Score: 12    End of  Session Equipment Utilized During Treatment: Oxygen Activity Tolerance: Patient tolerated treatment well Patient left: in chair;with call bell/phone within reach;with chair alarm set Nurse Communication: Mobility status PT Visit Diagnosis: Other abnormalities of gait and mobility (R26.89)     Time: 5009-3818 PT Time Calculation (min) (ACUTE ONLY): 20 min  Charges:  $Therapeutic Activity: 8-22 mins                     Rica Koyanagi  PTA Acute  Rehabilitation Services Pager      [phone] Office      [phone]

## 2020-04-14 DIAGNOSIS — J9621 Acute and chronic respiratory failure with hypoxia: Secondary | ICD-10-CM | POA: Diagnosis not present

## 2020-04-14 LAB — BASIC METABOLIC PANEL
Anion gap: 15 (ref 5–15)
BUN: 15 mg/dL (ref 8–23)
CO2: 28 mmol/L (ref 22–32)
Calcium: 9 mg/dL (ref 8.9–10.3)
Chloride: 92 mmol/L — ABNORMAL LOW (ref 98–111)
Creatinine, Ser: 0.41 mg/dL — ABNORMAL LOW (ref 0.61–1.24)
GFR, Estimated: >60 mL/min (ref >60)
Glucose, Bld: 149 mg/dL — ABNORMAL HIGH (ref 70–99)
Potassium: 4.1 mmol/L (ref 3.5–5.1)
Sodium: 135 mmol/L (ref 135–145)

## 2020-04-14 LAB — GLUCOSE, CAPILLARY
Glucose-Capillary: 125 mg/dL — ABNORMAL HIGH (ref 70–99)
Glucose-Capillary: 132 mg/dL — ABNORMAL HIGH (ref 70–99)
Glucose-Capillary: 132 mg/dL — ABNORMAL HIGH (ref 70–99)
Glucose-Capillary: 137 mg/dL — ABNORMAL HIGH (ref 70–99)

## 2020-04-14 LAB — PHOSPHORUS: Phosphorus: 2.9 mg/dL (ref 2.5–4.6)

## 2020-04-14 NOTE — Progress Notes (Signed)
PROGRESS NOTE    Devon Peterson  [id number] DOB: 10/24/1955 DOA: 03/19/2020 PCP: Patient, No Pcp Per   Chief Complaint  Patient presents with  . Respiratory Distress  Brief Narrative: 65 year old male with history of COPD, cocaine abuse, tobacco abuse, laryngeal cancer discharged with radiation therapy, squamous cell lung cancer, A. fib, aspiration pneumonia presented to the ED on 1/15 with shortness of breath.  As per the report May 2021 diagnosed with laryngeal lung cancer underwent tracheostomy. 07/23/2019 -08/21/2019 , patient underwent radiation to larynx.  08/26/2019, tracheostomy was decannulated. Also in May 2021, CT scan of chest showed 2.7 cm mass in the left lower lobe. Bronchoscopy was nondiagnostic.  02/11/2020, CT-guided biopsy of LLL mass showed squamous cell carcinoma. Underwent 4 sessions of radiation till 03/15/2020 03/10/2020-03/18/2020, patient was admitted for stridor and dyspnea. He was seen by ENT and recommended tracheostomy which he refused and has since discharged to SNF. 03/19/2020, patient presented to the ED again with dyspnea, started on hypoxia. Patient was seen by ENT and underwent tracheostomy placement. 03/18/2020-oncology Dr. Benay Spice recommended hospice. 1/30, PEG tube clogged. Tube feeding held. 1/31,PEG tube replaced by IR. Tube feeding resumed.  At this time patient is awaiting on placement due to new tracheostomy status and difficult placement.  Subjective: Seen and examined. Alert awake follows commands, on 5 L/28% FiO2 trach collar   Assessment & Plan:  Acute on chronic respiratory failure with hypoxia, s/p emergent tracheostomy by ENT followed by Dr. Starr Sinclair need outpatient follow-up.  Speech following.  Continuous trach collar, cont resp support  Goals of care/counseling/discussion: Previously had an extensive discussion with patient and family and patient remains full code, patient was interested in home hospice but unable to go home  due to poor home condition and plan is for discharge to skilled nursing facility and treat what is treatable and follow-up and establish care with palliative care as outpatient.  SCC cancer, underwent radiation, as per the his oncology note patient is a poor candidate for any systemic chemotherapy, hospice was recommended.  Outpatient follow-up with his oncology team/hospice team.  Laryngeal cancer underwent radiation, now with tracheostomy.  Continue trach care  Sepsis/aspiration pneumonia POA resolved,completed antibiotics.  Patient is afebrile.  Normocytic anemia monitor hemoglobin intermittently and transfuse if less than 7 g. Recent Labs  Lab 04/08/20 0311 04/09/20 0314 04/10/20 0312 04/11/20 0256 04/12/20 0318  HGB 7.6* 7.4* 8.0* 8.5* 8.4*  HCT 24.7* 23.6* 25.6* 26.7* 27.0*   PAF HX, EKG 1/15 showed sinus tachycardia, not on anticoagulation, not needing rate controlling agents  S/P PE 1/30-had clogged tube replaced by IR on 1/31, continue to feeding as tolerated  COPD not in exacerbation-continue inhalers  Laryngeal stridor:resolved on 2/3  Radiation pneumonitis resolved.  Complete antibiotics  Severe protein calorie malnutrition w/ bmi 16, appreciate dietitian input.  We will continue to increase and provide nutritional supplement to increase calorie intake   Nutrition: Diet Order            Diet NPO time specified Except for: Ice Chips  Diet effective now                 Nutrition Problem: Severe Malnutrition Etiology: chronic illness,cancer and cancer related treatments Signs/Symptoms: severe fat depletion,severe muscle depletion Interventions: Tube feeding,Prostat Pt's Body mass index is 16.6 kg/m.  DVT prophylaxis: enoxaparin (LOVENOX) injection 40 mg Start: 03/21/20 1600 Place and maintain sequential compression device Start: 03/19/20 0810 Code Status:   Code Status: Full Code  Family Communication: plan of care discussed  with patient at  bedside. Discussed with the nursing staff. Status is: Inpatient Remains inpatient appropriate because:Unsafe d/c plan and Inpatient level of care appropriate due to severity of illness  Dispo: The patient is from: Home              Anticipated d/c is to: SNF              Anticipated d/c date is: once bed available. >30 days post trach.              Patient currently is medically stable to d/c.   Difficult to place patient Yes  Consultants:see note  Procedures:see note  Culture/Microbiology    Component Value Date/Time   SDES  03/19/2020 0700    BLOOD RIGHT ANTECUBITAL Performed at [facility], [place] [address] [place], [place] [address]    [place]  03/19/2020 0700    BOTTLES DRAWN AEROBIC AND ANAEROBIC Blood Culture results may not be optimal due to an excessive volume of blood received in culture bottles Performed at [facility], [place] [address], [place], [place] [address]    CULT  03/19/2020 0700    NO GROWTH 5 DAYS Performed at [place] [address], [place], [place] [address]    REPTSTATUS 03/24/2020 FINAL 03/19/2020 0700    Other culture-see note  Medications: Scheduled Meds: . chlorhexidine  15 mL Mouth Rinse BID  . enoxaparin (LOVENOX) injection  40 mg Subcutaneous Q24H  . feeding supplement (PROSource TF)  45 mL Per Tube BID  . fentaNYL  1 patch Transdermal Q72H  . free water  100 mL Per Tube Q6H  . magnesium oxide  800 mg Per Tube Daily  . mouth rinse  15 mL Mouth Rinse q12n4p  . pantoprazole sodium  40 mg Per Tube q morning  . polyethylene glycol  17 g Per Tube Daily  . senna  2 tablet Per Tube Daily  . sertraline  50 mg Per Tube Daily  . thiamine  100 mg Per Tube Daily   Continuous Infusions: . sodium chloride 10 mL/hr at 04/13/20 0224  . feeding supplement (JEVITY 1.5 CAL/FIBER) 1,000 mL (04/13/20 0444)    Antimicrobials: Anti-infectives (From admission, onward)   Start     Dose/Rate Route  Frequency Ordered Stop   03/19/20 1213  piperacillin-tazobactam (ZOSYN) 3.375 GM/50ML IVPB       Note to Pharmacy: Estell Harpin   : cabinet override      03/19/20 1213 03/20/20 0014   03/19/20 1000  piperacillin-tazobactam (ZOSYN) IVPB 3.375 g  Status:  Discontinued        3.375 g 12.5 mL/hr over 240 Minutes Intravenous Every 8 hours 03/19/20 0852 03/23/20 1300     Objective: Vitals: Today's Vitals   04/14/20 0104 04/14/20 0149 04/14/20 0612 04/14/20 0625  BP:    107/73  Pulse:    94  Resp:    15  Temp:    98.5 F (36.9 C)  TempSrc:    Oral  SpO2:    100%  Weight:      Height:      PainSc: 6  Asleep 6      Intake/Output Summary (Last 24 hours) at 04/14/2020 0730 Last data filed at 04/14/2020 9509 Gross per 24 hour  Intake 45 ml  Output 1450 ml  Net -1405 ml   Filed Weights   04/08/20 0500 04/12/20 0500 04/13/20 0446  Weight: 30.4 kg 51.3 kg 54 kg   Weight  change:   Intake/Output from previous day: 02/09 0701 - 02/10 0700 In: 45 [P.O.:45] Out: 1450 [Urine:1450] Intake/Output this shift: No intake/output data recorded. Filed Weights   04/08/20 0500 04/12/20 0500 04/13/20 0446  Weight: 30.4 kg 51.3 kg 54 kg    Examination: General exam: AAO, follows commands appropriately moving extremities. HEENT:Oral mucosa moist, Ear/Nose WNL grossly, dentition normal. Respiratory system: bilaterally clear, trach collar in place saturating well, Cardiovascular system: S1 & S2 +, No JVD,. Gastrointestinal system: Abdomen soft, NT,ND, BS+. PEG+-no leaking drainage or redness or tenderness around PEG tube Nervous System:Alert, awake, moving extremities and grossly nonfocal Extremities: No edema, distal peripheral pulses palpable.  Skin: No rashes,no icterus. MSK: Thin muscle bulk no tone   Data Reviewed: I have personally reviewed following labs and imaging studies CBC: Recent Labs  Lab 04/08/20 0311 04/09/20 0314 04/10/20 0312 04/11/20 0256 04/12/20 0318  WBC 4.1 5.3  6.4 5.6 5.9  NEUTROABS 2.7 3.6 4.2 3.8 3.8  HGB 7.6* 7.4* 8.0* 8.5* 8.4*  HCT 24.7* 23.6* 25.6* 26.7* 27.0*  MCV 76.5* 74.7* 73.8* 73.4* 74.8*  PLT 347 359 418* 438* 237*   Basic Metabolic Panel: Recent Labs  Lab 04/08/20 0311 04/09/20 0314 04/10/20 0312 04/11/20 0256 04/12/20 0318 04/13/20 0332 04/14/20 0317  NA 137 130* 134* 132* 134*  --   --   K 3.4* 3.9 4.3 4.2 4.3  --   --   CL 98 96* 94* 91* 93*  --   --   CO2 28 27 28 29 30   --   --   GLUCOSE 135* 140* 136* 142* 140*  --   --   BUN 9 10 10 12 15   --   --   CREATININE 0.43* 0.39* 0.37* 0.47* 0.59*  --   --   CALCIUM 8.4* 8.3* 8.9 8.8* 9.1  --   --   MG 1.3* 1.4* 1.4* 1.3* 1.8  --   --   PHOS  --   --  3.0 3.8 3.0 3.6 2.9   GFR: Estimated Creatinine Clearance: 71.3 mL/min (A) (by C-G formula based on SCr of 0.59 mg/dL (L)). Liver Function Tests: No results for input(s): AST, ALT, ALKPHOS, BILITOT, PROT, ALBUMIN in the last 168 hours. No results for input(s): LIPASE, AMYLASE in the last 168 hours. No results for input(s): AMMONIA in the last 168 hours. Coagulation Profile: No results for input(s): INR, PROTIME in the last 168 hours. Cardiac Enzymes: No results for input(s): CKTOTAL, CKMB, CKMBINDEX, TROPONINI in the last 168 hours. BNP (last 3 results) No results for input(s): PROBNP in the last 8760 hours. HbA1C: No results for input(s): HGBA1C in the last 72 hours. CBG: Recent Labs  Lab 04/13/20 0537 04/13/20 1129 04/13/20 1805 04/14/20 0004 04/14/20 0617  GLUCAP 120* 113* 122* 132* 137*   Lipid Profile: No results for input(s): CHOL, HDL, LDLCALC, TRIG, CHOLHDL, LDLDIRECT in the last 72 hours. Thyroid Function Tests: No results for input(s): TSH, T4TOTAL, FREET4, T3FREE, THYROIDAB in the last 72 hours. Anemia Panel: No results for input(s): VITAMINB12, FOLATE, FERRITIN, TIBC, IRON, RETICCTPCT in the last 72 hours. Sepsis Labs: No results for input(s): PROCALCITON, LATICACIDVEN in the last 168  hours.  No results found for this or any previous visit (from the past 240 hour(s)).   Radiology Studies: No results found.   LOS: 26 days   Antonieta Pert, MD [facility]  04/14/2020, 7:30 AM

## 2020-04-15 DIAGNOSIS — J9621 Acute and chronic respiratory failure with hypoxia: Secondary | ICD-10-CM | POA: Diagnosis not present

## 2020-04-15 LAB — GLUCOSE, CAPILLARY
Glucose-Capillary: 103 mg/dL — ABNORMAL HIGH (ref 70–99)
Glucose-Capillary: 129 mg/dL — ABNORMAL HIGH (ref 70–99)
Glucose-Capillary: 131 mg/dL — ABNORMAL HIGH (ref 70–99)
Glucose-Capillary: 132 mg/dL — ABNORMAL HIGH (ref 70–99)
Glucose-Capillary: 99 mg/dL (ref 70–99)

## 2020-04-15 NOTE — Progress Notes (Signed)
PROGRESS NOTE    Devon Peterson  [id number] DOB: Dec 21, 1955 DOA: 03/19/2020 PCP: Patient, No Pcp Per   Chief Complaint  Patient presents with  . Respiratory Distress  Brief Narrative: 65 year old male with history of COPD, cocaine abuse, tobacco abuse, laryngeal cancer discharged with radiation therapy, squamous cell lung cancer, A. fib, aspiration pneumonia presented to the ED on 1/15 with shortness of breath.  As per the report May 2021 diagnosed with laryngeal lung cancer underwent tracheostomy. 07/23/2019 -08/21/2019 , patient underwent radiation to larynx.  08/26/2019, tracheostomy was decannulated. Also in May 2021, CT scan of chest showed 2.7 cm mass in the left lower lobe. Bronchoscopy was nondiagnostic.  02/11/2020, CT-guided biopsy of LLL mass showed squamous cell carcinoma. Underwent 4 sessions of radiation till 03/15/2020 03/10/2020-03/18/2020, patient was admitted for stridor and dyspnea. He was seen by ENT and recommended tracheostomy which he refused and has since discharged to SNF. 03/19/2020, patient presented to the ED again with dyspnea, started on hypoxia. Patient was seen by ENT and underwent tracheostomy placement. 03/18/2020-oncology Dr. Benay Spice recommended hospice. 1/30, PEG tube clogged. Tube feeding held. 1/31,PEG tube replaced by IR. Tube feeding resumed.  At this time patient is awaiting on placement due to new tracheostomy status and difficult placement.  Subjective:  Seen and examined this morning.  Resting comfortably alert awake follows commands try to verbalize. Did not verbalize Overnight no fever.   Remains on trach collar 28% FIO2/ 5 L  Assessment & Plan:  Acute on chronic respiratory failure with hypoxia, s/p emergent tracheostomy by ENT followed by Dr. Starr Sinclair need outpatient follow-up.  Continue routine trach collar, continue supplemental oxygen.  Goals of care/counseling/discussion: Previously had an extensive discussion with patient  and family and patient remains full code, patient was interested in home hospice but unable to go home due to poor home condition and plan is for discharge to skilled nursing facility and treat what is treatable and follow-up and establish care with palliative care as outpatient.  SCC cancer, underwent radiation, as per the his oncology note patient is a poor candidate for any systemic chemotherapy, hospice was recommended.  Laryngeal cancer underwent radiation, now with tracheostomy.  Continue trach care as #1  Sepsis/aspiration pneumonia POA resolved and patient finished antibiotics.    Normocytic anemia monitor hemoglobin intermittently and transfuse if less than 7 g. Recent Labs  Lab 04/09/20 0314 04/10/20 0312 04/11/20 0256 04/12/20 0318  HGB 7.4* 8.0* 8.5* 8.4*  HCT 23.6* 25.6* 26.7* 27.0*   PAF HX, EKG 1/15 showed sinus tachycardia, not on anticoagulation, not needing rate controlling agents  S/P Peg Clogged 1/30- replaced by IR on 1/31, continue to feeding as tolerated  COPD not in exacerbation-continue inhalers  Laryngeal stridor:resolved on 2/3  Radiation pneumonitis resolved.  Complete antibiotics  Severe protein calorie malnutrition w/ bmi 16, appreciate dietitian input.  We will continue to increase and provide nutritional supplement to increase calorie intake   Nutrition: Diet Order            Diet NPO time specified Except for: Ice Chips  Diet effective now                 Nutrition Problem: Severe Malnutrition Etiology: chronic illness,cancer and cancer related treatments Signs/Symptoms: severe fat depletion,severe muscle depletion Interventions: Tube feeding,Prostat Pt's Body mass index is 16.6 kg/m.  DVT prophylaxis: enoxaparin (LOVENOX) injection 40 mg Start: 03/21/20 1600 Place and maintain sequential compression device Start: 03/19/20 0810 Code Status:   Code Status: Full Code  Family Communication: plan of care discussed with patient at  bedside. Discussed with the nursing staff. Status is: Inpatient Remains inpatient appropriate because:Unsafe d/c plan and Inpatient level of care appropriate due to severity of illness  Dispo: The patient is from: Home              Anticipated d/c is to: SNF              Anticipated d/c date is: once bed available. >30 days post trach.              Patient currently is medically stable to d/c.   Difficult to place patient Yes  Consultants:see note  Procedures:see note  Culture/Microbiology    Component Value Date/Time   SDES  03/19/2020 0700    BLOOD RIGHT ANTECUBITAL Performed at [facility], [place] [address] [place], [place] [address]    [place]  03/19/2020 0700    BOTTLES DRAWN AEROBIC AND ANAEROBIC Blood Culture results may not be optimal due to an excessive volume of blood received in culture bottles Performed at [facility] [place] [address], [place], [place] [address]    CULT  03/19/2020 0700    NO GROWTH 5 DAYS Performed at [place] [address] [place], [place] [address]    REPTSTATUS 03/24/2020 FINAL 03/19/2020 0700    Other culture-see note  Medications: Scheduled Meds: . chlorhexidine  15 mL Mouth Rinse BID  . enoxaparin (LOVENOX) injection  40 mg Subcutaneous Q24H  . feeding supplement (PROSource TF)  45 mL Per Tube BID  . fentaNYL  1 patch Transdermal Q72H  . free water  100 mL Per Tube Q6H  . magnesium oxide  800 mg Per Tube Daily  . mouth rinse  15 mL Mouth Rinse q12n4p  . pantoprazole sodium  40 mg Per Tube q morning  . polyethylene glycol  17 g Per Tube Daily  . senna  2 tablet Per Tube Daily  . sertraline  50 mg Per Tube Daily  . thiamine  100 mg Per Tube Daily   Continuous Infusions: . sodium chloride 10 mL/hr at 04/15/20 0200  . feeding supplement (JEVITY 1.5 CAL/FIBER) 1,000 mL (04/14/20 1628)    Antimicrobials: Anti-infectives (From admission, onward)   Start     Dose/Rate Route  Frequency Ordered Stop   03/19/20 1213  piperacillin-tazobactam (ZOSYN) 3.375 GM/50ML IVPB       Note to Pharmacy: Estell Harpin   : cabinet override      03/19/20 1213 03/20/20 0014   03/19/20 1000  piperacillin-tazobactam (ZOSYN) IVPB 3.375 g  Status:  Discontinued        3.375 g 12.5 mL/hr over 240 Minutes Intravenous Every 8 hours 03/19/20 0852 03/23/20 1300     Objective: Vitals: Today's Vitals   04/15/20 0128 04/15/20 0228 04/15/20 0348 04/15/20 0614  BP:    103/74  Pulse:   (!) 107 94  Resp:   15 16  Temp:    99.6 F (37.6 C)  TempSrc:    Oral  SpO2:   99% 100%  Weight:      Height:      PainSc: 6  Asleep      Intake/Output Summary (Last 24 hours) at 04/15/2020 0734 Last data filed at 04/15/2020 0200 Gross per 24 hour  Intake 485.02 ml  Output 800 ml  Net -314.98 ml   Filed Weights   04/08/20 0500 04/12/20 0500 04/13/20 0446  Weight: 30.4 kg 51.3  kg 54 kg   Weight change:   Intake/Output from previous day: 02/10 0701 - 02/11 0700 In: 485 [I.V.:80; NG/GT:360] Out: 800 [Urine:800] Intake/Output this shift: No intake/output data recorded. Filed Weights   04/08/20 0500 04/12/20 0500 04/13/20 0446  Weight: 30.4 kg 51.3 kg 54 kg    Examination: General exam: AAO-does not verbalize but not in distress.  On trach collar. HEENT:Oral mucosa moist, Ear/Nose WNL grossly, dentition normal. Respiratory system: bilaterally clear breath sounds trach collar in place. No use of accessory muscle Cardiovascular system: S1 & S2 +, No JVD,. Gastrointestinal system: Abdomen soft, NT,ND, BS+ Nervous System:Alert, awake, moving extremities and grossly nonfocal Extremities: No edema, distal peripheral pulses palpable.  Skin: No rashes,no icterus. MSK: Normal muscle bulk,tone, power  Data Reviewed: I have personally reviewed following labs and imaging studies CBC: Recent Labs  Lab 04/09/20 0314 04/10/20 0312 04/11/20 0256 04/12/20 0318  WBC 5.3 6.4 5.6 5.9  NEUTROABS  3.6 4.2 3.8 3.8  HGB 7.4* 8.0* 8.5* 8.4*  HCT 23.6* 25.6* 26.7* 27.0*  MCV 74.7* 73.8* 73.4* 74.8*  PLT 359 418* 438* 106*   Basic Metabolic Panel: Recent Labs  Lab 04/09/20 0314 04/10/20 0312 04/11/20 0256 04/12/20 0318 04/13/20 0332 04/14/20 0317  NA 130* 134* 132* 134*  --  135  K 3.9 4.3 4.2 4.3  --  4.1  CL 96* 94* 91* 93*  --  92*  CO2 27 28 29 30   --  28  GLUCOSE 140* 136* 142* 140*  --  149*  BUN 10 10 12 15   --  15  CREATININE 0.39* 0.37* 0.47* 0.59*  --  0.41*  CALCIUM 8.3* 8.9 8.8* 9.1  --  9.0  MG 1.4* 1.4* 1.3* 1.8  --   --   PHOS  --  3.0 3.8 3.0 3.6 2.9   GFR: Estimated Creatinine Clearance: 71.3 mL/min (A) (by C-G formula based on SCr of 0.41 mg/dL (L)). Liver Function Tests: No results for input(s): AST, ALT, ALKPHOS, BILITOT, PROT, ALBUMIN in the last 168 hours. No results for input(s): LIPASE, AMYLASE in the last 168 hours. No results for input(s): AMMONIA in the last 168 hours. Coagulation Profile: No results for input(s): INR, PROTIME in the last 168 hours. Cardiac Enzymes: No results for input(s): CKTOTAL, CKMB, CKMBINDEX, TROPONINI in the last 168 hours. BNP (last 3 results) No results for input(s): PROBNP in the last 8760 hours. HbA1C: No results for input(s): HGBA1C in the last 72 hours. CBG: Recent Labs  Lab 04/14/20 0617 04/14/20 1113 04/14/20 1613 04/15/20 0003 04/15/20 0608  GLUCAP 137* 125* 132* 129* 131*   Lipid Profile: No results for input(s): CHOL, HDL, LDLCALC, TRIG, CHOLHDL, LDLDIRECT in the last 72 hours. Thyroid Function Tests: No results for input(s): TSH, T4TOTAL, FREET4, T3FREE, THYROIDAB in the last 72 hours. Anemia Panel: No results for input(s): VITAMINB12, FOLATE, FERRITIN, TIBC, IRON, RETICCTPCT in the last 72 hours. Sepsis Labs: No results for input(s): PROCALCITON, LATICACIDVEN in the last 168 hours.  No results found for this or any previous visit (from the past 240 hour(s)).   Radiology Studies: No results  found.   LOS: 27 days   Antonieta Pert, MD [facility]  04/15/2020, 7:34 AM

## 2020-04-15 NOTE — TOC Progression Note (Signed)
Transition of Care [facility]) - Progression Note    Patient Details  Name: Devon Peterson MRN: [id number] Date of Birth: 08/15/1955  Transition of Care [facility]) CM/SW [place], LCSW Phone Number: 04/15/2020, 6:23 PM  Clinical Narrative:    CSW received a call from patient sister Devon Peterson. She reports the patient lost his wallet while in the hospital. She is in the process of replacing his ss card, medicaid care and other documents that were lost. She was informed by the social security office she will need to provide a letter (with hospital letter head)  from the physician stating the patient cannot communicate with the social security office due to his current medical condition and will need for his sister Devon Peterson to act on his behalf. She reports the social security office will not accept HCPOA. TOC staff will follow up on Monday.  Expected Discharge Plan: Skilled Nursing Facility Barriers to Discharge: Other (comment) (SNF pending trach >= 30 days post placement)  Expected Discharge Plan and Services Expected Discharge Plan: [place] In-house Referral: Clinical Social Work     Living arrangements for the past 2 months: [place] ([place] since 08/2019) Expected Discharge Date:  (unknown)               DME Arranged: N/A DME Agency: NA                   Social Determinants of Health ([place]) Interventions    Readmission Risk Interventions Readmission Risk Prevention Plan 07/27/2019  Transportation Screening Complete  HRI or [place] Complete  Social Work Consult for [place] Planning/Counseling Complete  Palliative Care Screening Complete  Medication Review Press photographer) Complete  Some recent data might be hidden

## 2020-04-15 NOTE — Plan of Care (Signed)
  Problem: Health Behavior/Discharge Planning: Goal: Ability to manage health-related needs will improve Outcome: Progressing   

## 2020-04-16 DIAGNOSIS — J9621 Acute and chronic respiratory failure with hypoxia: Secondary | ICD-10-CM | POA: Diagnosis not present

## 2020-04-16 LAB — CBC
HCT: 27.2 % — ABNORMAL LOW (ref 39.0–52.0)
Hemoglobin: 8.5 g/dL — ABNORMAL LOW (ref 13.0–17.0)
MCH: 23.2 pg — ABNORMAL LOW (ref 26.0–34.0)
MCHC: 31.3 g/dL (ref 30.0–36.0)
MCV: 74.1 fL — ABNORMAL LOW (ref 80.0–100.0)
Platelets: 513 10*3/uL — ABNORMAL HIGH (ref 150–400)
RBC: 3.67 MIL/uL — ABNORMAL LOW (ref 4.22–5.81)
RDW: 19.3 % — ABNORMAL HIGH (ref 11.5–15.5)
WBC: 7.9 10*3/uL (ref 4.0–10.5)
nRBC: 0 % (ref 0.0–0.2)

## 2020-04-16 LAB — BASIC METABOLIC PANEL
Anion gap: 12 (ref 5–15)
BUN: 15 mg/dL (ref 8–23)
CO2: 30 mmol/L (ref 22–32)
Calcium: 9.1 mg/dL (ref 8.9–10.3)
Chloride: 92 mmol/L — ABNORMAL LOW (ref 98–111)
Creatinine, Ser: 0.48 mg/dL — ABNORMAL LOW (ref 0.61–1.24)
GFR, Estimated: >60 mL/min (ref >60)
Glucose, Bld: 132 mg/dL — ABNORMAL HIGH (ref 70–99)
Potassium: 4.1 mmol/L (ref 3.5–5.1)
Sodium: 134 mmol/L — ABNORMAL LOW (ref 135–145)

## 2020-04-16 LAB — GLUCOSE, CAPILLARY
Glucose-Capillary: 101 mg/dL — ABNORMAL HIGH (ref 70–99)
Glucose-Capillary: 121 mg/dL — ABNORMAL HIGH (ref 70–99)
Glucose-Capillary: 132 mg/dL — ABNORMAL HIGH (ref 70–99)
Glucose-Capillary: 144 mg/dL — ABNORMAL HIGH (ref 70–99)

## 2020-04-16 NOTE — Progress Notes (Signed)
PROGRESS NOTE    Devon Peterson  [id number] DOB: 1955-09-04 DOA: 03/19/2020 PCP: Patient, No Pcp Per   Chief Complaint  Patient presents with  . Respiratory Distress  Brief Narrative: 65 year old male with history of COPD, cocaine abuse, tobacco abuse, history of laryngeal cancer and previous tracheostomy discharged with radiation therapy, squamous cell lung cancer, A. fib, aspiration pneumonia presented to the ED on 1/15 with shortness of breath.  As per the report May 2021 diagnosed with laryngeal lung cancer underwent tracheostomy. 07/23/2019 -08/21/2019 , patient underwent radiation to larynx.  08/26/2019, tracheostomy was decannulated. Also in May 2021, CT scan of chest showed 2.7 cm mass in the left lower lobe. Bronchoscopy was nondiagnostic. 02/11/2020, CT-guided biopsy of LLL mass showed squamous cell carcinoma. Underwent 4 sessions of radiation till 03/15/2020 03/10/2020-03/18/2020, patient was admitted for stridor and dyspnea. He was seen by ENT and recommended tracheostomy which he refused and has since discharged to SNF. 03/19/2020, patient presented to the ED again with dyspnea, started on hypoxia. Patient was seen by ENT and underwent tracheostomy placement. 03/18/2020-oncology Dr. Benay Spice recommended hospice. 1/30, PEG tube clogged. Tube feeding held. 1/31,PEG tube replaced by IR. Tube feeding resumed. Tolerating tubefeed At this time patient is awaiting on placement due to new tracheostomy status   Subjective: Seen and examined.  Unable to verbalize but he is following commands appropriately able to move all his 4 extremities. No acute events overnight.  Patient remains on trach collar.  Assessment & Plan:  Acute on chronic respiratory failure with hypoxia, s/p emergent tracheostomy by ENT followed by Dr. Starr Sinclair need outpatient follow-up eventually continue on current trach collar, respiratory support.  Goals of care/counseling/discussion: Previously had an  extensive discussion with patient and family and patient remains full code, patient was interested in home hospice but unable to go home due to poor home condition and plan is for discharge to skilled nursing facility and treat what is treatable and follow-up and establish care with palliative care as outpatient.  SCC lung, underwent radiation, as per the his oncology note patient is a poor candidate for any systemic chemotherapy, hospice was recommended.  History of laryngeal cancer and previous tracheostomy/XRT,Continue trach care as #1  Sepsis/aspiration pneumonia POA resolved and patient finished antibiotics.    Normocytic anemia hemoglobin on repeat check today appears stable.   Recent Labs  Lab 04/10/20 0312 04/11/20 0256 04/12/20 0318 04/16/20 0318  HGB 8.0* 8.5* 8.4* 8.5*  HCT 25.6* 26.7* 27.0* 27.2*   PAF HX, EKG 1/15 showed sinus tachycardia, is not on anticoagulation and currently not needing rate controlling agents.   S/P Peg, replaced by IR on 1/31, tolerating tube feeds, cont same.  COPD not in exacerbation-continue inhalers  Laryngeal stridor:resolved on 2/3  Radiation pneumonitis resolved.  Complete antibiotics  Severe protein calorie malnutrition w/ bmi 16, appreciate dietitian input.  We will continue to increase and provide nutritional supplement to increase calorie intake   Nutrition: Diet Order            Diet NPO time specified Except for: Ice Chips  Diet effective now                 Nutrition Problem: Severe Malnutrition Etiology: chronic illness,cancer and cancer related treatments Signs/Symptoms: severe fat depletion,severe muscle depletion Interventions: Tube feeding,Prostat Pt's Body mass index is 16.6 kg/m.  DVT prophylaxis: enoxaparin (LOVENOX) injection 40 mg Start: 03/21/20 1600 Place and maintain sequential compression device Start: 03/19/20 0810 Code Status:   Code Status: Full Code  Family Communication: plan of care discussed with  patient at bedside. Discussed with the nursing staff. Status is: Inpatient Remains inpatient appropriate because:Unsafe d/c plan and Inpatient level of care appropriate due to severity of illness  Dispo: The patient is from: [facility]              Anticipated d/c is to: SNF              Anticipated d/c date is: once bed available. >30 days post trach.              Patient currently is medically stable to d/c.   Difficult to place patient Yes  Consultants:see note  Procedures:see note  Culture/Microbiology    Component Value Date/Time   SDES  03/19/2020 0700    BLOOD RIGHT ANTECUBITAL Performed at [facility], [place] [address], [place], [place] [address]    [place]  03/19/2020 0700    BOTTLES DRAWN AEROBIC AND ANAEROBIC Blood Culture results may not be optimal due to an excessive volume of blood received in culture bottles Performed at [facility], [place] [address], [place], [place] [address]    CULT  03/19/2020 0700    NO GROWTH 5 DAYS Performed at [place] [address], [place],  [address]    REPTSTATUS 03/24/2020 FINAL 03/19/2020 0700    Other culture-see note  Medications: Scheduled Meds: . chlorhexidine  15 mL Mouth Rinse BID  . enoxaparin (LOVENOX) injection  40 mg Subcutaneous Q24H  . feeding supplement (PROSource TF)  45 mL Per Tube BID  . fentaNYL  1 patch Transdermal Q72H  . free water  100 mL Per Tube Q6H  . magnesium oxide  800 mg Per Tube Daily  . mouth rinse  15 mL Mouth Rinse q12n4p  . pantoprazole sodium  40 mg Per Tube q morning  . polyethylene glycol  17 g Per Tube Daily  . senna  2 tablet Per Tube Daily  . sertraline  50 mg Per Tube Daily  . thiamine  100 mg Per Tube Daily   Continuous Infusions: . sodium chloride Stopped (04/15/20 1405)  . feeding supplement (JEVITY 1.5 CAL/FIBER) 1,000 mL (04/16/20 0151)    Antimicrobials: Anti-infectives (From admission, onward)   Start      Dose/Rate Route Frequency Ordered Stop   03/19/20 1213  piperacillin-tazobactam (ZOSYN) 3.375 GM/50ML IVPB       Note to Pharmacy: Estell Harpin   : cabinet override      03/19/20 1213 03/20/20 0014   03/19/20 1000  piperacillin-tazobactam (ZOSYN) IVPB 3.375 g  Status:  Discontinued        3.375 g 12.5 mL/hr over 240 Minutes Intravenous Every 8 hours 03/19/20 0852 03/23/20 1300     Objective: Vitals: Today's Vitals   04/16/20 0338 04/16/20 0350 04/16/20 0435 04/16/20 0602  BP:    122/72  Pulse: 97   98  Resp: 14   16  Temp:    98.9 F (37.2 C)  TempSrc:      SpO2: 98%   98%  Weight:      Height:      PainSc:  8  Asleep     Intake/Output Summary (Last 24 hours) at 04/16/2020 0722 Last data filed at 04/16/2020 0602 Gross per 24 hour  Intake 664.19 ml  Output 1500 ml  Net -835.81 ml   Filed Weights   04/08/20 0500 04/12/20 0500 04/13/20 0446  Weight: 30.4 kg 51.3 kg 54  kg   Weight change:   Intake/Output from previous day: 02/11 0701 - 02/12 0700 In: 664.2 [I.V.:44.2; NG/GT:620] Out: 1500 [Urine:1500] Intake/Output this shift: No intake/output data recorded. Filed Weights   04/08/20 0500 04/12/20 0500 04/13/20 0446  Weight: 30.4 kg 51.3 kg 54 kg    Examination: General exam: AAO, did not verbalize but follows commands.  HEENT:Oral mucosa moist, Ear/Nose WNL grossly, dentition normal.  Trach collar in place with 5 L O2 Respiratory system: bilaterally diminished at base but no wheezing or crackles,no use of accessory muscle Cardiovascular system: S1 & S2 +, No JVD,. Gastrointestinal system: Abdomen soft, NT,ND, BS+ Nervous System:Alert, awake, moving extremities and grossly nonfocal Extremities: No edema, distal peripheral pulses palpable.  Skin: No rashes,no icterus. MSK:  THIN MUSCLE MASS AND BULK  Data Reviewed: I have personally reviewed following labs and imaging studies CBC: Recent Labs  Lab 04/10/20 0312 04/11/20 0256 04/12/20 0318 04/16/20 0318   WBC 6.4 5.6 5.9 7.9  NEUTROABS 4.2 3.8 3.8  --   HGB 8.0* 8.5* 8.4* 8.5*  HCT 25.6* 26.7* 27.0* 27.2*  MCV 73.8* 73.4* 74.8* 74.1*  PLT 418* 438* 475* 384*   Basic Metabolic Panel: Recent Labs  Lab 04/10/20 0312 04/11/20 0256 04/12/20 0318 04/13/20 0332 04/14/20 0317 04/16/20 0318  NA 134* 132* 134*  --  135 134*  K 4.3 4.2 4.3  --  4.1 4.1  CL 94* 91* 93*  --  92* 92*  CO2 28 29 30   --  28 30  GLUCOSE 136* 142* 140*  --  149* 132*  BUN 10 12 15   --  15 15  CREATININE 0.37* 0.47* 0.59*  --  0.41* 0.48*  CALCIUM 8.9 8.8* 9.1  --  9.0 9.1  MG 1.4* 1.3* 1.8  --   --   --   PHOS 3.0 3.8 3.0 3.6 2.9  --    GFR: Estimated Creatinine Clearance: 71.3 mL/min (A) (by C-G formula based on SCr of 0.48 mg/dL (L)). Liver Function Tests: No results for input(s): AST, ALT, ALKPHOS, BILITOT, PROT, ALBUMIN in the last 168 hours. No results for input(s): LIPASE, AMYLASE in the last 168 hours. No results for input(s): AMMONIA in the last 168 hours. Coagulation Profile: No results for input(s): INR, PROTIME in the last 168 hours. Cardiac Enzymes: No results for input(s): CKTOTAL, CKMB, CKMBINDEX, TROPONINI in the last 168 hours. BNP (last 3 results) No results for input(s): PROBNP in the last 8760 hours. HbA1C: No results for input(s): HGBA1C in the last 72 hours. CBG: Recent Labs  Lab 04/15/20 0608 04/15/20 1141 04/15/20 1743 04/15/20 2349 04/16/20 0601  GLUCAP 131* 99 132* 103* 132*   Lipid Profile: No results for input(s): CHOL, HDL, LDLCALC, TRIG, CHOLHDL, LDLDIRECT in the last 72 hours. Thyroid Function Tests: No results for input(s): TSH, T4TOTAL, FREET4, T3FREE, THYROIDAB in the last 72 hours. Anemia Panel: No results for input(s): VITAMINB12, FOLATE, FERRITIN, TIBC, IRON, RETICCTPCT in the last 72 hours. Sepsis Labs: No results for input(s): PROCALCITON, LATICACIDVEN in the last 168 hours.  No results found for this or any previous visit (from the past 240 hour(s)).    Radiology Studies: No results found.   LOS: 28 days   Antonieta Pert, MD [facility]  04/16/2020, 7:22 AM

## 2020-04-17 DIAGNOSIS — J9621 Acute and chronic respiratory failure with hypoxia: Secondary | ICD-10-CM | POA: Diagnosis not present

## 2020-04-17 LAB — GLUCOSE, CAPILLARY
Glucose-Capillary: 145 mg/dL — ABNORMAL HIGH (ref 70–99)
Glucose-Capillary: 115 mg/dL — ABNORMAL HIGH (ref 70–99)
Glucose-Capillary: 144 mg/dL — ABNORMAL HIGH (ref 70–99)
Glucose-Capillary: 154 mg/dL — ABNORMAL HIGH (ref 70–99)

## 2020-04-17 NOTE — Progress Notes (Signed)
PROGRESS NOTE    Devon Peterson  [id number] DOB: Jun 23, 1955 DOA: 03/19/2020 PCP: Patient, No Pcp Per   Chief Complaint  Patient presents with  . Respiratory Distress  Brief Narrative: 65 year old male with history of COPD, cocaine abuse, tobacco abuse, history of laryngeal cancer and previous tracheostomy discharged with radiation therapy, squamous cell lung cancer, A. fib, aspiration pneumonia presented to the ED on 1/15 with shortness of breath.  As per the report May 2021 diagnosed with laryngeal lung cancer underwent tracheostomy. 07/23/2019 -08/21/2019 , patient underwent radiation to larynx.  08/26/2019, tracheostomy was decannulated. Also in May 2021, CT scan of chest showed 2.7 cm mass in the left lower lobe. Bronchoscopy was nondiagnostic. 02/11/2020, CT-guided biopsy of LLL mass showed squamous cell carcinoma. Underwent 4 sessions of radiation till 03/15/2020 03/10/2020-03/18/2020, patient was admitted for stridor and dyspnea. He was seen by ENT and recommended tracheostomy which he refused and has since discharged to SNF. 03/19/2020, patient presented to the ED again with dyspnea, started on hypoxia. Patient was seen by ENT and underwent tracheostomy placement. 03/18/2020-oncology Dr. Benay Spice recommended hospice. 1/30, PEG tube clogged. Tube feeding held. 1/31,PEG tube replaced by IR. Tube feeding resumed. Tolerating tubefeed At this time patient is awaiting on placement due to new tracheostomy status   Subjective:  No acute events overnight.  Patient remains on trach collar Alert awake, does not verbalize but follows commands  Appropriate  Assessment & Plan:  Acute on chronic respiratory failure with hypoxia, s/p emergent tracheostomy by ENT.  Respiratory status is stable tolerating trach collar at 5 L continue current respiratory support follow-up with ENT as outpatient w/ Dr Wilburn Cornelia.  Goals of care/counseling/discussion: Previously had an extensive discussion with  patient and family and patient remains full code, patient was interested in home hospice but unable to go home due to poor home condition and plan is for discharge to skilled nursing facility and treat what is treatable and follow-up and establish care with palliative care as outpatient.  SCC lung, underwent radiation, as per the his oncology note patient is a poor candidate for any systemic chemotherapy, hospice was recommended.  History of laryngeal cancer and previous tracheostomy/XRT,Continue trach care as #1  Sepsis/aspiration pneumonia POA resolved and patient finished antibiotics.    Normocytic anemia hemoglobin on repeat check today appears stable.   Recent Labs  Lab 04/11/20 0256 04/12/20 0318 04/16/20 0318  HGB 8.5* 8.4* 8.5*  HCT 26.7* 27.0* 27.2*   PAF HX, EKG 1/15 showed sinus tachycardia, is not on anticoagulation and currently not needing rate controlling agents.   S/P Peg, replaced by IR on 1/31, tolerating tube feeds, cont same.  COPD not in exacerbation-continue inhalers  Laryngeal stridor:resolved on 2/3  Radiation pneumonitis resolved.  Complete antibiotics  Severe protein calorie malnutrition w/ bmi 16, appreciate dietitian input.  We will continue to increase and provide nutritional supplement to increase calorie intake   Nutrition: Diet Order            Diet NPO time specified Except for: Ice Chips  Diet effective now                 Nutrition Problem: Severe Malnutrition Etiology: chronic illness,cancer and cancer related treatments Signs/Symptoms: severe fat depletion,severe muscle depletion Interventions: Tube feeding,Prostat Pt's Body mass index is 15.06 kg/m.  DVT prophylaxis: enoxaparin (LOVENOX) injection 40 mg Start: 03/21/20 1600 Place and maintain sequential compression device Start: 03/19/20 0810 Code Status:   Code Status: Full Code  Family Communication: plan of care  discussed with patient at bedside. Discussed with the nursing  staff. Status is: Inpatient Remains inpatient appropriate because:Unsafe d/c plan and Inpatient level of care appropriate due to severity of illness  Dispo: The patient is from: [facility]              Anticipated d/c is to: SNF              Anticipated d/c date is: once bed available. >30 days post trach.              Patient currently is medically stable to d/c.   Difficult to place patient Yes  Consultants:see note  Procedures:see note  Culture/Microbiology    Component Value Date/Time   SDES  03/19/2020 0700    BLOOD RIGHT ANTECUBITAL Performed at [facility], [place] [address] [place], [place] [address]    [place]  03/19/2020 0700    BOTTLES DRAWN AEROBIC AND ANAEROBIC Blood Culture results may not be optimal due to an excessive volume of blood received in culture bottles Performed at [facility], [place] [address] [place], [place] [address]    CULT  03/19/2020 0700    NO GROWTH 5 DAYS Performed at [facility] [address] [place],  [address]    REPTSTATUS 03/24/2020 FINAL 03/19/2020 0700    Other culture-see note  Medications: Scheduled Meds: . chlorhexidine  15 mL Mouth Rinse BID  . enoxaparin (LOVENOX) injection  40 mg Subcutaneous Q24H  . feeding supplement (PROSource TF)  45 mL Per Tube BID  . fentaNYL  1 patch Transdermal Q72H  . free water  100 mL Per Tube Q6H  . magnesium oxide  800 mg Per Tube Daily  . mouth rinse  15 mL Mouth Rinse q12n4p  . pantoprazole sodium  40 mg Per Tube q morning  . polyethylene glycol  17 g Per Tube Daily  . senna  2 tablet Per Tube Daily  . sertraline  50 mg Per Tube Daily  . thiamine  100 mg Per Tube Daily   Continuous Infusions: . sodium chloride Stopped (04/15/20 1405)  . feeding supplement (JEVITY 1.5 CAL/FIBER) 1,000 mL (04/16/20 1929)    Antimicrobials: Anti-infectives (From admission, onward)   Start     Dose/Rate Route Frequency Ordered Stop   03/19/20  1213  piperacillin-tazobactam (ZOSYN) 3.375 GM/50ML IVPB       Note to Pharmacy: Estell Harpin   : cabinet override      03/19/20 1213 03/20/20 0014   03/19/20 1000  piperacillin-tazobactam (ZOSYN) IVPB 3.375 g  Status:  Discontinued        3.375 g 12.5 mL/hr over 240 Minutes Intravenous Every 8 hours 03/19/20 0852 03/23/20 1300     Objective: Vitals: Today's Vitals   04/17/20 0600 04/17/20 0642 04/17/20 0800 04/17/20 0920  BP:      Pulse:      Resp:      Temp:      TempSrc:      SpO2:    96%  Weight:      Height:      PainSc: 4  Asleep 0-No pain     Intake/Output Summary (Last 24 hours) at 04/17/2020 1103 Last data filed at 04/17/2020 0815 Gross per 24 hour  Intake 550 ml  Output 1975 ml  Net -1425 ml   Filed Weights   04/12/20 0500 04/13/20 0446 04/17/20 0500  Weight: 51.3 kg 54 kg 49 kg   Weight change:  Intake/Output from previous day: 02/12 0701 - 02/13 0700 In: 550 [P.O.:30; NG/GT:520] Out: 1975 [Urine:1975] Intake/Output this shift: No intake/output data recorded. Filed Weights   04/12/20 0500 04/13/20 0446 04/17/20 0500  Weight: 51.3 kg 54 kg 49 kg    Examination: General exam: AAO does not verbalize clean and frail, NAD, weak appearing. HEENT:Oral mucosa moist, Ear/Nose WNL grossly, dentition normal. Respiratory system: bilaterally conducted air sound present on trach collar at 5 L.no use of accessory muscle Cardiovascular system: S1 & S2 +, No JVD,. Gastrointestinal system: Abdomen soft, NT,ND, BS+ PEG tube present Nervous System:Alert, awake, moving extremities and grossly nonfocal Extremities: No edema, distal peripheral pulses palpable.  Skin: No rashes,no icterus. MSK: Small muscle bulk, low tone and decreased strength  Data Reviewed: I have personally reviewed following labs and imaging studies CBC: Recent Labs  Lab 04/11/20 0256 04/12/20 0318 04/16/20 0318  WBC 5.6 5.9 7.9  NEUTROABS 3.8 3.8  --   HGB 8.5* 8.4* 8.5*  HCT 26.7* 27.0*  27.2*  MCV 73.4* 74.8* 74.1*  PLT 438* 475* 998*   Basic Metabolic Panel: Recent Labs  Lab 04/11/20 0256 04/12/20 0318 04/13/20 0332 04/14/20 0317 04/16/20 0318  NA 132* 134*  --  135 134*  K 4.2 4.3  --  4.1 4.1  CL 91* 93*  --  92* 92*  CO2 29 30  --  28 30  GLUCOSE 142* 140*  --  149* 132*  BUN 12 15  --  15 15  CREATININE 0.47* 0.59*  --  0.41* 0.48*  CALCIUM 8.8* 9.1  --  9.0 9.1  MG 1.3* 1.8  --   --   --   PHOS 3.8 3.0 3.6 2.9  --    GFR: Estimated Creatinine Clearance: 64.7 mL/min (A) (by C-G formula based on SCr of 0.48 mg/dL (L)). Liver Function Tests: No results for input(s): AST, ALT, ALKPHOS, BILITOT, PROT, ALBUMIN in the last 168 hours. No results for input(s): LIPASE, AMYLASE in the last 168 hours. No results for input(s): AMMONIA in the last 168 hours. Coagulation Profile: No results for input(s): INR, PROTIME in the last 168 hours. Cardiac Enzymes: No results for input(s): CKTOTAL, CKMB, CKMBINDEX, TROPONINI in the last 168 hours. BNP (last 3 results) No results for input(s): PROBNP in the last 8760 hours. HbA1C: No results for input(s): HGBA1C in the last 72 hours. CBG: Recent Labs  Lab 04/16/20 0601 04/16/20 1153 04/16/20 1741 04/16/20 2337 04/17/20 0540  GLUCAP 132* 101* 121* 144* 145*   Lipid Profile: No results for input(s): CHOL, HDL, LDLCALC, TRIG, CHOLHDL, LDLDIRECT in the last 72 hours. Thyroid Function Tests: No results for input(s): TSH, T4TOTAL, FREET4, T3FREE, THYROIDAB in the last 72 hours. Anemia Panel: No results for input(s): VITAMINB12, FOLATE, FERRITIN, TIBC, IRON, RETICCTPCT in the last 72 hours. Sepsis Labs: No results for input(s): PROCALCITON, LATICACIDVEN in the last 168 hours.  No results found for this or any previous visit (from the past 240 hour(s)).   Radiology Studies: No results found.   LOS: 29 days   Antonieta Pert, MD [facility]  04/17/2020, 11:03 AM

## 2020-04-18 DIAGNOSIS — J9621 Acute and chronic respiratory failure with hypoxia: Secondary | ICD-10-CM | POA: Diagnosis not present

## 2020-04-18 LAB — GLUCOSE, CAPILLARY
Glucose-Capillary: 140 mg/dL — ABNORMAL HIGH (ref 70–99)
Glucose-Capillary: 140 mg/dL — ABNORMAL HIGH (ref 70–99)
Glucose-Capillary: 142 mg/dL — ABNORMAL HIGH (ref 70–99)
Glucose-Capillary: 156 mg/dL — ABNORMAL HIGH (ref 70–99)

## 2020-04-18 NOTE — TOC Progression Note (Addendum)
Transition of Care [facility]) - Progression Note    Patient Details  Name: Devon Peterson MRN: [id number] Date of Birth: 25-Aug-1955  Transition of Care [facility]) CM/SW [place], [place] Phone Number: 04/18/2020, 3:27 PM  Clinical Narrative:    Physician completed the requested letter for social security purposes and faxed to the patient sister Devon Peterson. CSW reached out to [facility] to coordinate the patient return. [facility] Admission Coordinator informed CSW they cannot accept the patient back at this this time. She reports the patient is considered to be "a new patient" by SNF standards if the patient is away from the SNF for more than 30 days. [facility] are not accepting new patient's t until the state of [place] clears them to do so. The [facility] is not sure when this will happen. She encouraged CSW to find another SNF for the patient.  CSW updated sister Devon Peterson.   Expected Discharge Plan: [place] Barriers to Discharge: No SNF bed  Expected Discharge Plan and Services Expected Discharge Plan: [place] In-house Referral: Clinical Social Work     Living arrangements for the past 2 months: [place] ([place] since 08/2019) Expected Discharge Date:  (unknown)               DME Arranged: N/A DME Agency: NA                   Social Determinants of Health ([place]) Interventions    Readmission Risk Interventions Readmission Risk Prevention Plan 07/27/2019  Transportation Screening Complete  HRI or [place] Complete  Social Work Consult for [place] Planning/Counseling Complete  Palliative Care Screening Complete  Medication Review Press photographer) Complete  Some recent data might be hidden

## 2020-04-18 NOTE — Progress Notes (Signed)
Manufacturing engineer [facility]) Community Based Palliative Care       This patient has been referred to our palliative care services in the community.  [facility] will continue to follow for any discharge planning needs and to coordinate admission onto palliative care.    Thank you for the opportunity to participate in this patient's care.     Domenic Moras, BSN, RN [facility] Liaison   [phone] (24h on call)

## 2020-04-18 NOTE — Progress Notes (Signed)
PROGRESS NOTE    Devon Peterson  [id number] DOB: 02/11/1956 DOA: 03/19/2020 PCP: Patient, No Pcp Per   Chief Complaint  Patient presents with  . Respiratory Distress  Brief Narrative: 65 year old male with history of COPD, cocaine abuse, tobacco abuse, history of laryngeal cancer and previous tracheostomy discharged with radiation therapy, squamous cell lung cancer, A. fib, aspiration pneumonia presented to the ED on 1/15 with shortness of breath.  As per the report May 2021 diagnosed with laryngeal lung cancer underwent tracheostomy. 07/23/2019 -08/21/2019 , patient underwent radiation to larynx.  08/26/2019, tracheostomy was decannulated. Also in May 2021, CT scan of chest showed 2.7 cm mass in the left lower lobe. Bronchoscopy was nondiagnostic. 02/11/2020, CT-guided biopsy of LLL mass showed squamous cell carcinoma. Underwent 4 sessions of radiation till 03/15/2020 03/10/2020-03/18/2020, patient was admitted for stridor and dyspnea. He was seen by ENT and recommended tracheostomy which he refused and has since discharged to SNF. 03/19/2020, patient presented to the ED again with dyspnea, started on hypoxia. Patient was seen by ENT and underwent tracheostomy placement. 03/18/2020-oncology Dr. Benay Spice recommended hospice. 1/30, PEG tube clogged. Tube feeding held. 1/31,PEG tube replaced by IR. Tube feeding resumed. Tolerating tubefeed At this time patient is awaiting on placement due to new tracheostomy status   Subjective: Seen and examined this morning.  Patient is alert awake follows commands appropriately.  Does not verbalize. On trach collar at 5 L.  Assessment & Plan:  Acute on chronic respiratory failure with hypoxia, s/p emergent tracheostomy by ENT.  Respiratory status is stable tolerating trach collar at 5 L continue current respiratory support follow-up with ENT as outpatient w/ Dr Wilburn Cornelia.  Respiratory status remains stable.  Goals of care/counseling/discussion:  Previously had an extensive discussion with patient and family and patient remains full code, patient was interested in home hospice but unable to go home due to poor home condition and plan is for discharge to skilled nursing facility and treat what is treatable and follow-up and establish care with palliative care as outpatient.  SCC lung, underwent radiation, as per the his oncology note patient is a poor candidate for any systemic chemotherapy, hospice was recommended.  History of laryngeal cancer and previous tracheostomy/XRT,Continue trach care as #1  Sepsis/aspiration pneumonia POA resolved and patient finished antibiotics.    Normocytic anemia hemoglobin on repeat check today appears stable.   Recent Labs  Lab 04/12/20 0318 04/16/20 0318  HGB 8.4* 8.5*  HCT 27.0* 27.2*   PAF HX, EKG 1/15 showed sinus tachycardia, is not on anticoagulation and currently not needing rate controlling agents.   S/P Peg, replaced by IR on 1/31, tolerating tube feeds, cont same.  COPD not in exacerbation-continue inhalers  Laryngeal stridor:resolved on 2/3  Radiation pneumonitis resolved.  Complete antibiotics  Severe protein calorie malnutrition w/ bmi 16, appreciate dietitian input.  We will continue to increase and provide nutritional supplement to increase calorie intake   Nutrition: Diet Order            Diet NPO time specified Except for: Ice Chips  Diet effective now                 Nutrition Problem: Severe Malnutrition Etiology: chronic illness,cancer and cancer related treatments Signs/Symptoms: severe fat depletion,severe muscle depletion Interventions: Tube feeding,Prostat Pt's Body mass index is 15.2 kg/m.  DVT prophylaxis: enoxaparin (LOVENOX) injection 40 mg Start: 03/21/20 1600 Place and maintain sequential compression device Start: 03/19/20 0810 Code Status:   Code Status: Full Code  Family Communication:  plan of care discussed with patient at bedside. Discussed with  the nursing staff. Status is: Inpatient Remains inpatient appropriate because:Unsafe d/c plan and Inpatient level of care appropriate due to severity of illness  Dispo: The patient is from: [facility]              Anticipated d/c is to: SNF hopefully soon once accepted,  RN to check if he needs Covid swab              Anticipated d/c date is: once bed available. >30 days post trach.              Patient currently is medically stable to d/c.   Difficult to place patient Yes  Consultants:see note  Procedures:see note  Culture/Microbiology    Component Value Date/Time   SDES  03/19/2020 0700    BLOOD RIGHT ANTECUBITAL Performed at [facility], [place] [address] [place], [place] [address]    [place]  03/19/2020 0700    BOTTLES DRAWN AEROBIC AND ANAEROBIC Blood Culture results may not be optimal due to an excessive volume of blood received in culture bottles Performed at [facility], [place] [address], [place], [place] [address]    CULT  03/19/2020 0700    NO GROWTH 5 DAYS Performed at [place] [address] [place], [place] [address]    REPTSTATUS 03/24/2020 FINAL 03/19/2020 0700    Other culture-see note  Medications: Scheduled Meds: . chlorhexidine  15 mL Mouth Rinse BID  . enoxaparin (LOVENOX) injection  40 mg Subcutaneous Q24H  . feeding supplement (PROSource TF)  45 mL Per Tube BID  . fentaNYL  1 patch Transdermal Q72H  . free water  100 mL Per Tube Q6H  . magnesium oxide  800 mg Per Tube Daily  . mouth rinse  15 mL Mouth Rinse q12n4p  . pantoprazole sodium  40 mg Per Tube q morning  . polyethylene glycol  17 g Per Tube Daily  . senna  2 tablet Per Tube Daily  . sertraline  50 mg Per Tube Daily  . thiamine  100 mg Per Tube Daily   Continuous Infusions: . sodium chloride Stopped (04/15/20 1405)  . feeding supplement (JEVITY 1.5 CAL/FIBER) 1,000 mL (04/16/20 1929)    Antimicrobials: Anti-infectives (From  admission, onward)   Start     Dose/Rate Route Frequency Ordered Stop   03/19/20 1213  piperacillin-tazobactam (ZOSYN) 3.375 GM/50ML IVPB       Note to Pharmacy: Estell Harpin   : cabinet override      03/19/20 1213 03/20/20 0014   03/19/20 1000  piperacillin-tazobactam (ZOSYN) IVPB 3.375 g  Status:  Discontinued        3.375 g 12.5 mL/hr over 240 Minutes Intravenous Every 8 hours 03/19/20 0852 03/23/20 1300     Objective: Vitals: Today's Vitals   04/18/20 0457 04/18/20 0500 04/18/20 0507 04/18/20 0541  BP:   108/73   Pulse: 97  (!) 101   Resp: 18  18   Temp:   98.8 F (37.1 C)   TempSrc:   Oral   SpO2: 98%  100%   Weight:  49.4 kg    Height:      PainSc:    Asleep    Intake/Output Summary (Last 24 hours) at 04/18/2020 0737 Last data filed at 04/18/2020 0600 Gross per 24 hour  Intake 780 ml  Output 1100 ml  Net -320 ml   Filed Weights   04/13/20  5361 04/17/20 0500 04/18/20 0500  Weight: 54 kg 49 kg 49.4 kg   Weight change: 0.454 kg  Intake/Output from previous day: 02/13 0701 - 02/14 0700 In: 780 [NG/GT:780] Out: 1100 [Urine:1100] Intake/Output this shift: No intake/output data recorded. Filed Weights   04/13/20 0446 04/17/20 0500 04/18/20 0500  Weight: 54 kg 49 kg 49.4 kg    Examination: General exam: AAO, and baseline nonverbalizing, on 5 L trach collar, cachectic temporal. HEENT:Oral mucosa moist, Ear/Nose WNL grossly, dentition normal. Respiratory system: bilaterally diminished, trach collar+ Cardiovascular system: S1 & S2 +, No JVD,. Gastrointestinal system: Abdomen soft, NT,ND, PEG+ BS+ Nervous System:Alert, awake, moving extremities and grossly nonfocal Extremities: No edema, distal peripheral pulses palpable.  Skin: No rashes,no icterus. MSK: Normal muscle bulk,tone, power  Data Reviewed: I have personally reviewed following labs and imaging studies CBC: Recent Labs  Lab 04/12/20 0318 04/16/20 0318  WBC 5.9 7.9  NEUTROABS 3.8  --   HGB 8.4*  8.5*  HCT 27.0* 27.2*  MCV 74.8* 74.1*  PLT 475* 443*   Basic Metabolic Panel: Recent Labs  Lab 04/12/20 0318 04/13/20 0332 04/14/20 0317 04/16/20 0318  NA 134*  --  135 134*  K 4.3  --  4.1 4.1  CL 93*  --  92* 92*  CO2 30  --  28 30  GLUCOSE 140*  --  149* 132*  BUN 15  --  15 15  CREATININE 0.59*  --  0.41* 0.48*  CALCIUM 9.1  --  9.0 9.1  MG 1.8  --   --   --   PHOS 3.0 3.6 2.9  --    GFR: Estimated Creatinine Clearance: 65.2 mL/min (A) (by C-G formula based on SCr of 0.48 mg/dL (L)). Liver Function Tests: No results for input(s): AST, ALT, ALKPHOS, BILITOT, PROT, ALBUMIN in the last 168 hours. No results for input(s): LIPASE, AMYLASE in the last 168 hours. No results for input(s): AMMONIA in the last 168 hours. Coagulation Profile: No results for input(s): INR, PROTIME in the last 168 hours. Cardiac Enzymes: No results for input(s): CKTOTAL, CKMB, CKMBINDEX, TROPONINI in the last 168 hours. BNP (last 3 results) No results for input(s): PROBNP in the last 8760 hours. HbA1C: No results for input(s): HGBA1C in the last 72 hours. CBG: Recent Labs  Lab 04/17/20 0540 04/17/20 1209 04/17/20 1746 04/17/20 2326 04/18/20 0514  GLUCAP 145* 115* 144* 154* 156*   Lipid Profile: No results for input(s): CHOL, HDL, LDLCALC, TRIG, CHOLHDL, LDLDIRECT in the last 72 hours. Thyroid Function Tests: No results for input(s): TSH, T4TOTAL, FREET4, T3FREE, THYROIDAB in the last 72 hours. Anemia Panel: No results for input(s): VITAMINB12, FOLATE, FERRITIN, TIBC, IRON, RETICCTPCT in the last 72 hours. Sepsis Labs: No results for input(s): PROCALCITON, LATICACIDVEN in the last 168 hours.  No results found for this or any previous visit (from the past 240 hour(s)).   Radiology Studies: No results found.   LOS: 30 days   Antonieta Pert, MD [facility]  04/18/2020, 7:37 AM

## 2020-04-19 LAB — GLUCOSE, CAPILLARY
Glucose-Capillary: 138 mg/dL — ABNORMAL HIGH (ref 70–99)
Glucose-Capillary: 119 mg/dL — ABNORMAL HIGH (ref 70–99)
Glucose-Capillary: 146 mg/dL — ABNORMAL HIGH (ref 70–99)

## 2020-04-19 NOTE — Progress Notes (Signed)
  Speech Language Pathology Treatment: Dysphagia;Passy Muir Speaking valve  Patient Details Name: Devon Peterson MRN: [id number] DOB: 20-Oct-1955 Today's Date: 04/19/2020 Time: 8588-5027 SLP Time Calculation (min) (ACUTE ONLY): 14 min  Assessment / Plan / Recommendation Clinical Impression  Attempts to phonate with trach occluded were not successful - pt with immediate retention and minimal airflow through proximal airway.  Pt advised to continue efforts to phonate with clean gloved hand over trach.  Suspect the potential mucosal area vs radiation area narrows airway to 3 mm.  Pt will not tolerate PMSV in this SLPs opinion unless pt's airway can be patent.  SLP reachout out to ENT Dr Wilburn Cornelia to seek guidance in pt's care plan.  Pt noted to have ice ini a cup and advises he is consuming it.  Lingual surface is solid white- took picture and showed pt.  Concern for oral candidiasis - SLP encouraged to brush his tongue/gums, etc and had him brush with SLP in room. Pt is communication via white board and yes/no or "Mouthing" words.  SLP also measured pt for trismus apparatus to maintain mandibular ROM as best able for speech/swallowing.  Pt agreeable to use with question.  Difficult measurement given depth of gum line from lips and decreased facial ROM.  Will establish at 44 mm.     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 Per MD noted, tracheostomy placed on 07/09/19 and decannulated on 08/26/19; radiation to larynx from 07/23/19 to 08/21/19. Found to have 2.7 cm mass in LLL on CT with biopsy 02/11/20 showed squamous cell carcinoma more concerning for additional lung primary rather than metastatic disease. Admitted from 03/10/20 to 03/18/20 with stridor and dyspnea and tracheostomy planned 03/17/20  > pt refused then readmitted with dyspnea, stridor and hypoxia underwent emergent tracheostomy. ENT findings: very firm/dense anterior tracheal wall.  Necrotic right hemilarynx down to glottis and  involving anterior and medial pyriform sinus and with partial erosion of right edge of epiglottis. Glottis not well-visualized.  Post-cricoid hypopharynx tight and firm. Seen prior by ST for PMV and dysphagia.  Pt underwent MBS that showed severe aspiration and he was made npo x ice.  He has been seen for PMSV trial but was unable to tolerate.      SLP Plan    Follow      Recommendations  Diet recommendations: Thin liquid (recommend pt be allows clears with precautions as listed in room.)                        GO              Devon Lime, MS [place] Office [phone] Pager [phone]   Devon Peterson 04/19/2020, 2:23 PM

## 2020-04-19 NOTE — Progress Notes (Signed)
Physical Therapy Treatment Patient Details Name: Devon Peterson MRN: [id number] DOB: September 07, 1955 Today's Date: 04/19/2020    History of Present Illness 65 y.o. 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 fib, aspiration pneumonia.  Patient presented to ED on 1/15 with complaint of shortness of breath. Dx of respiratory failure.  Recent admission 1/6-1/14/22 for sepsis 2* PNA.    PT Comments    Pt agreeable to OOB and assisted to recliner.  Pt requiring min assist for stand pivot at this time.  Pt encouraged to cough and clear secretions throughout session.  Continue to recommend SNF upon d/c.   Follow Up Recommendations  SNF     Equipment Recommendations  None recommended by PT    Recommendations for Other Services       Precautions / Restrictions Precautions Precautions: Fall Precaution Comments: currently on 5L O2 28% Trach collar, PEG    Mobility  Bed Mobility Overal bed mobility: Needs Assistance Bed Mobility: Rolling;Sidelying to Sit Rolling: Supervision   Supine to sit: Supervision     General bed mobility comments: increased time, use of rail to self assist    Transfers Overall transfer level: Needs assistance Equipment used: Rolling walker (2 wheeled) Transfers: Sit to/from [facility] Sit to Stand: Min assist Stand pivot transfers: Min assist       General transfer comment: assist to rise and steady, nurse tech assisting and provided pericare upon standing, pt able to assist with controlling descent to recliner  Ambulation/Gait                 Stairs             Wheelchair Mobility    Modified Rankin (Stroke Patients Only)       Balance                                            Cognition Arousal/Alertness: Awake/alert Behavior During Therapy: WFL for tasks assessed/performed Overall  Cognitive Status: Within Functional Limits for tasks assessed                                 General Comments: AxO x 3 following all directions just non verbal due to trach but does use a white board to communicate      Exercises      General Comments        Pertinent Vitals/Pain Pain Assessment: Faces Faces Pain Scale: No hurt Pain Intervention(s): Repositioned;Monitored during session    Home Living                      Prior Function            PT Goals (current goals can now be found in the care plan section) Progress towards PT goals: Progressing toward goals    Frequency    Min 2X/week      PT Plan Current plan remains appropriate    Co-evaluation              AM-PAC PT "6 Clicks" Mobility   Outcome Measure  Help needed turning from your back to your side while in a flat bed without using bedrails?: A Little Help needed moving from lying on  your back to sitting on the side of a flat bed without using bedrails?: A Little Help needed moving to and from a bed to a chair (including a wheelchair)?: A Lot Help needed standing up from a chair using your arms (e.g., wheelchair or bedside chair)?: A Lot Help needed to walk in hospital room?: A Lot Help needed climbing 3-5 steps with a railing? : Total 6 Click Score: 13    End of Session Equipment Utilized During Treatment: Gait belt;Oxygen Activity Tolerance: Patient tolerated treatment well Patient left: in chair;with call bell/phone within reach;with chair alarm set Nurse Communication: Mobility status PT Visit Diagnosis: Other abnormalities of gait and mobility (R26.89)     Time: 4562-5638 PT Time Calculation (min) (ACUTE ONLY): 22 min  Charges:  $Therapeutic Activity: 8-22 mins                     Devon Peterson PT, DPT Acute Rehabilitation Services Pager: [phone] Office: [phone]  [name] E 04/19/2020, 12:52 PM

## 2020-04-19 NOTE — Progress Notes (Signed)
Nutrition Follow-up  DOCUMENTATION CODES:   Underweight,Severe malnutrition in context of chronic illness  INTERVENTION:  - continue Jevity 1.5 @ 65 ml/hr with 45 ml Prosource TF (or equivalent) BID and 100 ml free water QID.  NUTRITION DIAGNOSIS:   Severe Malnutrition related to chronic illness,cancer and cancer related treatments as evidenced by severe fat depletion,severe muscle depletion. -ongoing  GOAL:   Patient will meet greater than or equal to 90% of their needs -met with TF regimen  MONITOR:   Labs,Weight trends,I & O's,TF tolerance   ASSESSMENT:   65 yo male former smoker with laryngeal cancer (poorly differentiated squamous cell) presented to ER with dyspnea, stridor and hypoxia.  Patient remains NPO on trach collar and with PEG in place. He is sleeping at the time of visit with no family or visitors present.   He is receiving Jevity 1.5 @ 65 ml/hr with 45 ml Prosource TF BID and 100 ml free water QID. This regimen is providing 2420 kcal, 121 grams protein, and 1586 ml free water.   Weight has been stable for the past 2 days but has been fluctuating extensively throughout hospitalization.    MD note from today states patient is medically stable and is pending a bed at SNF once available.    Labs reviewed; CBGs: 138 and 119 mg/dl Medications reviewed; 800 mg mag-ox/day, 40 mg protonix per PEG/day, 17 g miralax/day, 2 tablets senokot/day, 100 mg thiamine/day.    Diet Order:   Diet Order            Diet NPO time specified Except for: Ice Chips  Diet effective now                 EDUCATION NEEDS:   No education needs have been identified at this time  Skin:  Skin Assessment: Skin Integrity Issues: Skin Integrity Issues:: Incisions Incisions: closed neck s/p trach  Last BM:  2/13 (type 5)  Height:   Ht Readings from Last 1 Encounters:  03/19/20 $RemoveB'5\' 11"'hJLpOTCj$  (1.803 m)    Weight:   Wt Readings from Last 1 Encounters:  04/19/20 49.4 kg    Estimated  Nutritional Needs:  Kcal:  2000-2200 Protein:  110-125 grams Fluid:  > 2 L     Jarome Matin, MS, RD, LDN, CNSC Inpatient Clinical Dietitian RD pager # available in [place]  After hours/weekend pager # available in [facility]

## 2020-04-19 NOTE — Progress Notes (Signed)
PROGRESS NOTE    Devon Peterson  [id number] DOB: Dec 14, 1955 DOA: 03/19/2020 PCP: Patient, No Pcp Per   Chief Complaint  Patient presents with  . Respiratory Distress  Brief Narrative: 65 year old male with history of COPD, cocaine abuse, tobacco abuse, history of laryngeal cancer and previous tracheostomy discharged with radiation therapy, squamous cell lung cancer, A. fib, aspiration pneumonia presented to the ED on 1/15 with shortness of breath.  As per the report May 2021 diagnosed with laryngeal lung cancer underwent tracheostomy. 07/23/2019 -08/21/2019 , patient underwent radiation to larynx.  08/26/2019, tracheostomy was decannulated. Also in May 2021, CT scan of chest showed 2.7 cm mass in the left lower lobe. Bronchoscopy was nondiagnostic. 02/11/2020, CT-guided biopsy of LLL mass showed squamous cell carcinoma. Underwent 4 sessions of radiation till 03/15/2020 03/10/2020-03/18/2020, patient was admitted for stridor and dyspnea. He was seen by ENT and recommended tracheostomy which he refused and has since discharged to SNF. 03/19/2020, patient presented to the ED again with dyspnea, started on hypoxia. Patient was seen by ENT and underwent tracheostomy placement. 03/18/2020-oncology Dr. Benay Spice recommended hospice. 1/30, PEG tube clogged. Tube feeding held. 1/31,PEG tube replaced by IR. Tube feeding resumed. Tolerating tubefeed At this time patient is awaiting on placement due to new tracheostomy status   Subjective: Seen and examined no new complaints.   Tolerating tube feeding and on trach collar   Assessment & Plan:  Acute on chronic respiratory failure with hypoxia, s/p emergent tracheostomy by ENT.  Continue on current respiratory support. He needs  follow-up with ENT as outpatient w/ Dr Wilburn Cornelia.  Respiratory status remains stable on trach collar at 5 L.  Tolerating tube feeding.  Goals of care/counseling/discussion: Previously had an extensive discussion with patient  and family and patient remains full code, patient was interested in home hospice but unable to go home due to poor home condition and plan is for discharge to skilled nursing facility and treat what is treatable and follow-up and establish care with palliative care as outpatient.  SCC lung, underwent radiation, as per the his oncology note patient is a poor candidate for any systemic chemotherapy, hospice was recommended.  History of laryngeal cancer and previous tracheostomy/XRT,Continue trach care as #1  Sepsis/aspiration pneumonia POA resolved and patient finished antibiotics.    Normocytic anemia hemoglobin on repeat check today appears stable.   Recent Labs  Lab 04/16/20 0318  HGB 8.5*  HCT 27.2*   PAF HX, EKG 1/15 showed sinus tachycardia, is not on anticoagulation and currently not needing rate controlling agents.   S/P Peg, replaced by IR on 1/31, tolerating tube feeds, cont same.  COPD not in exacerbation-continue inhalers  Laryngeal stridor:resolved on 2/3  Radiation pneumonitis resolved.  Complete antibiotics  Severe protein calorie malnutrition w/ bmi 16, appreciate dietitian input.  We will continue to increase and provide nutritional supplement to increase calorie intake   Nutrition: Diet Order            Diet NPO time specified Except for: Ice Chips  Diet effective now                 Nutrition Problem: Severe Malnutrition Etiology: chronic illness,cancer and cancer related treatments Signs/Symptoms: severe fat depletion,severe muscle depletion Interventions: Tube feeding,Prostat Pt's Body mass index is 15.2 kg/m.  DVT prophylaxis: enoxaparin (LOVENOX) injection 40 mg Start: 03/21/20 1600 Place and maintain sequential compression device Start: 03/19/20 0810 Code Status:   Code Status: Full Code  Family Communication: plan of care discussed with patient at  bedside. Discussed with the nursing staff. Status is: Inpatient Remains inpatient appropriate  because:Unsafe d/c plan and Inpatient level of care appropriate due to severity of illness  Dispo: The patient is from: [facility]              Anticipated d/c is to: SNF once bed available              Anticipated d/c date is: once bed available. >30 days post trach.              Patient currently is medically stable to d/c.   Difficult to place patient Yes  Consultants:see note  Procedures:see note  Culture/Microbiology    Component Value Date/Time   SDES  03/19/2020 0700    BLOOD RIGHT ANTECUBITAL Performed at [facility], [place] [address], [place], [place] [address]    [place]  03/19/2020 0700    BOTTLES DRAWN AEROBIC AND ANAEROBIC Blood Culture results may not be optimal due to an excessive volume of blood received in culture bottles Performed at [facility], [place] [address] [place], [place] [address]    CULT  03/19/2020 0700    NO GROWTH 5 DAYS Performed at [place] [address] [place], [place] [address]    REPTSTATUS 03/24/2020 FINAL 03/19/2020 0700    Other culture-see note  Medications: Scheduled Meds: . chlorhexidine  15 mL Mouth Rinse BID  . enoxaparin (LOVENOX) injection  40 mg Subcutaneous Q24H  . feeding supplement (PROSource TF)  45 mL Per Tube BID  . fentaNYL  1 patch Transdermal Q72H  . free water  100 mL Per Tube Q6H  . magnesium oxide  800 mg Per Tube Daily  . mouth rinse  15 mL Mouth Rinse q12n4p  . pantoprazole sodium  40 mg Per Tube q morning  . polyethylene glycol  17 g Per Tube Daily  . senna  2 tablet Per Tube Daily  . sertraline  50 mg Per Tube Daily  . thiamine  100 mg Per Tube Daily   Continuous Infusions: . sodium chloride Stopped (04/15/20 1405)  . feeding supplement (JEVITY 1.5 CAL/FIBER) 1,000 mL (04/16/20 1929)    Antimicrobials: Anti-infectives (From admission, onward)   Start     Dose/Rate Route Frequency Ordered Stop   03/19/20 1213  piperacillin-tazobactam (ZOSYN) 3.375  GM/50ML IVPB       Note to Pharmacy: Estell Harpin   : cabinet override      03/19/20 1213 03/20/20 0014   03/19/20 1000  piperacillin-tazobactam (ZOSYN) IVPB 3.375 g  Status:  Discontinued        3.375 g 12.5 mL/hr over 240 Minutes Intravenous Every 8 hours 03/19/20 0852 03/23/20 1300     Objective: Vitals: Today's Vitals   04/19/20 0358 04/19/20 0500 04/19/20 0532 04/19/20 0752  BP:   108/71   Pulse: 98  94   Resp: 16  17   Temp:   99.4 F (37.4 C)   TempSrc:   Oral   SpO2: 98%  100% 98%  Weight:  49.4 kg    Height:      PainSc:        Intake/Output Summary (Last 24 hours) at 04/19/2020 1158 Last data filed at 04/19/2020 8182 Gross per 24 hour  Intake 900 ml  Output 1000 ml  Net -100 ml   Filed Weights   04/17/20 0500 04/18/20 0500 04/19/20 0500  Weight: 49 kg 49.4 kg 49.4 kg   Weight change: 0  kg  Intake/Output from previous day: 02/14 0701 - 02/15 0700 In: 900 [NG/GT:900] Out: 1000 [Urine:1000] Intake/Output this shift: No intake/output data recorded. Filed Weights   04/17/20 0500 04/18/20 0500 04/19/20 0500  Weight: 49 kg 49.4 kg 49.4 kg    Examination: General exam: AAO, at baseline does not verbalize but follows commands appropriately, old for his age, on trach collar. HEENT:Oral mucosa moist, Ear/Nose WNL grossly, dentition normal. Respiratory system: bilaterally air entry present, trach collar present Cardiovascular system: S1 & S2 +, No JVD,. Gastrointestinal system: Abdomen soft, PEG present NT,ND, BS+ Nervous System:Alert, awake, moving extremities and grossly nonfocal Extremities: No edema, distal peripheral pulses palpable.  Skin: No rashes,no icterus. MSK: Normal muscle bulk,tone, power    Data Reviewed: I have personally reviewed following labs and imaging studies CBC: Recent Labs  Lab 04/16/20 0318  WBC 7.9  HGB 8.5*  HCT 27.2*  MCV 74.1*  PLT 528*   Basic Metabolic Panel: Recent Labs  Lab 04/13/20 0332 04/14/20 0317  04/16/20 0318  NA  --  135 134*  K  --  4.1 4.1  CL  --  92* 92*  CO2  --  28 30  GLUCOSE  --  149* 132*  BUN  --  15 15  CREATININE  --  0.41* 0.48*  CALCIUM  --  9.0 9.1  PHOS 3.6 2.9  --    GFR: Estimated Creatinine Clearance: 65.2 mL/min (A) (by C-G formula based on SCr of 0.48 mg/dL (L)). Liver Function Tests: No results for input(s): AST, ALT, ALKPHOS, BILITOT, PROT, ALBUMIN in the last 168 hours. No results for input(s): LIPASE, AMYLASE in the last 168 hours. No results for input(s): AMMONIA in the last 168 hours. Coagulation Profile: No results for input(s): INR, PROTIME in the last 168 hours. Cardiac Enzymes: No results for input(s): CKTOTAL, CKMB, CKMBINDEX, TROPONINI in the last 168 hours. BNP (last 3 results) No results for input(s): PROBNP in the last 8760 hours. HbA1C: No results for input(s): HGBA1C in the last 72 hours. CBG: Recent Labs  Lab 04/18/20 0514 04/18/20 1105 04/18/20 1804 04/18/20 2345 04/19/20 0527  GLUCAP 156* 140* 140* 142* 138*   Lipid Profile: No results for input(s): CHOL, HDL, LDLCALC, TRIG, CHOLHDL, LDLDIRECT in the last 72 hours. Thyroid Function Tests: No results for input(s): TSH, T4TOTAL, FREET4, T3FREE, THYROIDAB in the last 72 hours. Anemia Panel: No results for input(s): VITAMINB12, FOLATE, FERRITIN, TIBC, IRON, RETICCTPCT in the last 72 hours. Sepsis Labs: No results for input(s): PROCALCITON, LATICACIDVEN in the last 168 hours.  No results found for this or any previous visit (from the past 240 hour(s)).   Radiology Studies: No results found.   LOS: 31 days   Antonieta Pert, MD [facility]  04/19/2020, 11:58 AM

## 2020-04-20 LAB — GLUCOSE, CAPILLARY
Glucose-Capillary: 128 mg/dL — ABNORMAL HIGH (ref 70–99)
Glucose-Capillary: 132 mg/dL — ABNORMAL HIGH (ref 70–99)
Glucose-Capillary: 135 mg/dL — ABNORMAL HIGH (ref 70–99)
Glucose-Capillary: 146 mg/dL — ABNORMAL HIGH (ref 70–99)

## 2020-04-20 NOTE — Discharge Summary (Signed)
Physician Discharge Summary  Devon Peterson [id number] DOB: January 12, 1956 DOA: 03/19/2020  PCP: Patient, No Pcp Per  Admit date: 03/19/2020 Discharge date: 04/21/2020  Admitted From:[facility] Disposition:  snf Recommendations for Outpatient Follow-up:  1. Follow up with PCP/ent Dr Jonette Eva gso ent  Home Health:no  Equipment/Devices: none  Discharge Condition: Stable Code Status:   Code Status: Full Code Diet recommendation:  Diet Order            Diet NPO time specified Except for: Ice Chips  Diet effective now                  Brief/Interim Summary:  65 year old male with history of COPD, cocaine abuse, tobacco abuse, history of laryngeal cancer and previous tracheostomy discharged with radiation therapy, squamous cell lung cancer, A. fib, aspiration pneumonia presented to the ED on 1/15 with shortness of breath.  As per the report May 2021 diagnosed with laryngeal lung cancer underwent tracheostomy. 07/23/2019 -08/21/2019 , patient underwent radiation to larynx.  08/26/2019, tracheostomy was decannulated. Also in May 2021, CT scan of chest showed 2.7 cm mass in the left lower lobe. Bronchoscopy was nondiagnostic. 02/11/2020, CT-guided biopsy of LLL mass showed squamous cell carcinoma. Underwent 4 sessions of radiation till 03/15/2020 03/10/2020-03/18/2020, patient was admitted for stridor and dyspnea. He was seen by ENT and recommended tracheostomy which he refused and was since discharged to SNF. Patient presented to the ED 1/15 with dyspnea,hypoxia and underwent tracheostomy placement. 03/18/2020-oncology Dr. Benay Spice recommended hospice. 1/30, PEG tube clogged. Tube feeding held. 1/31,PEG tube replaced by IR. Tube feeding resumed. Tolerating tubefeed At this time patient is awaiting on placement due to new tracheostomy status  On 2/16 patient had difficulty with tracheostomy socks on, Dr. Avon Gully change the tracheostomy.  At this time no issues okay to discharge to  skilled nursing facility  Discharge Diagnoses:   Acute on chronic respiratory failure with hypoxia, s/p emergent tracheostomy by ENT.  Tolerating trach collar continue the same continue outpatient follow-up with ENTw/ Dr. Redmond Baseman   Goals of care/counseling/discussion: Previously had an extensive discussion with patient and family and patient remains full code, patient was interested in home hospice but unable to go home due to poor home condition and plan is for discharge to skilled nursing facility and treat what is treatable and follow-up and establish care with palliative care as outpatient.  Of note oncology has recommended hospice/comfort measures but patient remains full code as per his request, sisters aware  SCC lung, underwent radiation, as per the his oncology note patient is a poor candidate for any systemic chemotherapy, hospice was recommended by his oncologist.  laryngeal cancer and previous tracheostomy/XRT,Continue trach care as #1  Sepsis/aspiration pneumonia POA resolved and patient finished antibiotics.    Normocytic anemia hemoglobin chronically low but stable.  No evidence of acute bleeding.    PAF HX, EKG 1/15 showed sinus tachycardia, is not on anticoagulation and currently not needing rate controlling agents.   S/P Peg, replaced by IR on 1/31, tolerating tube feeds, cont same.  COPD not in exacerbation-continue inhalers  Laryngeal stridor:resolved on 2/3  Radiation pneumonitis resolved.  Complete antibiotics  Severe protein calorie malnutrition w/ bmi 16, appreciate dietitian input.  We will continue to increase and provide nutritional supplement to increase calorie intake   Consults:  ENT  Oncology  Palliative care  Subjective: Alert awake, not in acute distress, tolerating 5 L trach collar.    Discharge Exam: Vitals:   04/21/20 0322 04/21/20 0612  BP:  106/71  Pulse: (!) 109 95  Resp: 15 16  Temp:  98.5 F (36.9 C)  SpO2: 100% 100%    General: Pt is alert, awake, not in acute distress Cardiovascular: RRR, S1/S2 +, no rubs, no gallops Respiratory: CTA bilaterally, no wheezing, no rhonchi Abdominal: Soft, NT, ND, bowel sounds + PEG+ Extremities: no edema, no cyanosis  Discharge Instructions  Discharge Instructions    Discharge instructions   Complete by: As directed    NPO FOLLOW UP WITH PALLIATIVE CARE/HOSPICE TEAM  Please call call MD or return to ER for similar or worsening recurring problem that brought you to hospital or if any fever,nausea/vomiting,abdominal pain, uncontrolled pain, chest pain,  shortness of breath or any other alarming symptoms.  Please follow-up your doctor as instructed in a week time and call the office for appointment.  Please avoid alcohol, smoking, or any other illicit substance and maintain healthy habits including taking your regular medications as prescribed.  You were cared for by a hospitalist during your hospital stay. If you have any questions about your discharge medications or the care you received while you were in the hospital after you are discharged, you can call the unit and ask to speak with the hospitalist on call if the hospitalist that took care of you is not available.  Once you are discharged, your primary care physician will handle any further medical issues. Please note that NO REFILLS for any discharge medications will be authorized once you are discharged, as it is imperative that you return to your primary care physician (or establish a relationship with a primary care physician if you do not have one) for your aftercare needs so that they can reassess your need for medications and monitor your lab values   Increase activity slowly   Complete by: As directed    No dressing needed   Complete by: As directed      Allergies as of 04/21/2020      Reactions   Albumin (human) Itching   May be able to handle at low rates.      Medication List    STOP taking  these medications   dexamethasone 2 MG tablet Commonly known as: DECADRON   feeding supplement (PRO-STAT SUGAR FREE 64) Liqd   ferrous sulfate 325 (65 FE) MG tablet   nystatin 100000 UNIT/ML suspension Commonly known as: MYCOSTATIN   OVER THE COUNTER MEDICATION   oxycodone 5 MG capsule Commonly known as: OXY-IR Replaced by: oxyCODONE 5 MG/5ML solution   pantoprazole 40 MG tablet Commonly known as: Protonix Replaced by: pantoprazole sodium 40 mg/20 mL Pack     TAKE these medications   chlorhexidine gluconate (MEDLINE KIT) 0.12 % solution Commonly known as: PERIDEX 15 mLs by Mouth Rinse route 2 (two) times daily.   feeding supplement (PROSource TF) liquid Place 45 mLs into feeding tube 2 (two) times daily. What changed:   how much to take  when to take this   feeding supplement (JEVITY 1.5 CAL/FIBER) Liqd Place 1,000 mLs into feeding tube continuous. What changed: You were already taking a medication with the same name, and this prescription was added. Make sure you understand how and when to take each.   free water Soln Place 100 mLs into feeding tube every 6 (six) hours.   Ipratropium-Albuterol 20-100 MCG/ACT Aers respimat Commonly known as: COMBIVENT Inhale 1 puff into the lungs every 6 (six) hours as needed for wheezing.   magnesium oxide 400 (241.3 Mg) MG tablet Commonly known  as: MAG-OX Place 2 tablets (800 mg total) into feeding tube daily.   oxyCODONE 5 MG/5ML solution Commonly known as: ROXICODONE Place 5 mLs (5 mg total) into feeding tube every 6 (six) hours as needed for up to 3 doses for severe pain. Replaces: oxycodone 5 MG capsule   pantoprazole sodium 40 mg/20 mL Pack Commonly known as: PROTONIX Place 20 mLs (40 mg total) into feeding tube every morning. Replaces: pantoprazole 40 MG tablet   polyethylene glycol 17 g packet Commonly known as: MIRALAX / GLYCOLAX Place 17 g into feeding tube daily. What changed: how to take this   senna 8.6  MG Tabs tablet Commonly known as: SENOKOT Place 2 tablets (17.2 mg total) into feeding tube daily. What changed: how to take this   sertraline 50 MG tablet Commonly known as: Zoloft Place 1 tablet (50 mg total) into feeding tube daily. What changed: how to take this   thiamine 100 MG tablet Place 1 tablet (100 mg total) into feeding tube daily. What changed: how to take this            Discharge Care Instructions  (From admission, onward)         Start     Ordered   04/21/20 0000  No dressing needed        04/21/20 1031          Contact information for after-discharge care    Destination    [facility] .   Service: Skilled Nursing Contact information: [place] [address] [phone]                 Allergies  Allergen Reactions  . Albumin (Human) Itching    May be able to handle at low rates.    The results of significant diagnostics from this hospitalization (including imaging, microbiology, ancillary and laboratory) are listed below for reference.    Microbiology: Recent Results (from the past 240 hour(s))  SARS CORONAVIRUS 2 (TAT 6-24 HRS) Nasopharyngeal Nasopharyngeal Swab     Status: None   Collection Time: 04/20/20  5:39 PM   Specimen: Nasopharyngeal Swab  Result Value Ref Range Status   SARS Coronavirus 2 NEGATIVE NEGATIVE Final    Comment: (NOTE) SARS-CoV-2 target nucleic acids are NOT DETECTED.  The SARS-CoV-2 RNA is generally detectable in upper and lower respiratory specimens during the acute phase of infection. Negative results do not preclude SARS-CoV-2 infection, do not rule out co-infections with other pathogens, and should not be used as the sole basis for treatment or other patient management decisions. Negative results must be combined with clinical observations, patient history, and epidemiological information. The expected result is Negative.  Fact Sheet for  Patients: SugarRoll.be  Fact Sheet for Healthcare Providers: https://www.woods-mathews.com/  This test is not yet approved or cleared by the [place] FDA and  has been authorized for detection and/or diagnosis of SARS-CoV-2 by FDA under an Emergency Use Authorization (EUA). This EUA will remain  in effect (meaning this test can be used) for the duration of the COVID-19 declaration under Se ction 564(b)(1) of the Act, 21 U.S.C. section 360bbb-3(b)(1), unless the authorization is terminated or revoked sooner.  Performed at [facility], [place] [address] [place], [place] [address]     Procedures/Studies: DG Swallowing Func-Speech Pathology  Result Date: 04/01/2020 Objective Swallowing Evaluation: Type of Study: MBS-Modified Barium Swallow Study  Patient Details Name: Devon Peterson MRN: [id number] Date of Birth: 1955-10-20 Today's Date:  04/01/2020 Time: SLP Start Time (ACUTE ONLY): 0948 -SLP Stop Time (ACUTE ONLY): 1025 SLP Time Calculation (min) (ACUTE ONLY): 37 min Past Medical History: Past Medical History: Diagnosis Date . Cancer ([place])  . COPD (chronic obstructive pulmonary disease) ([place])  . Medical history non-contributory  Past Surgical History: Past Surgical History: Procedure Laterality Date . BIOPSY  08/20/2019  Procedure: BIOPSY;  Surgeon: Chesley Mires, MD;  Location: [facility];  Service: Endoscopy;; . BRONCHIAL WASHINGS  08/20/2019  Procedure: BRONCHIAL WASHINGS;  Surgeon: Chesley Mires, MD;  Location: [facility];  Service: Endoscopy;;  LLL BAL . DIRECT LARYNGOSCOPY N/A 07/09/2019  Procedure: DIRECT LARYNGOSCOPY WITH BIOPSY;  Surgeon: Jerrell Belfast, MD;  Location: [facility];  Service: ENT;  Laterality: N/A; . IR FLUORO RM 30-60 MIN  07/13/2019 . IR GASTROSTOMY TUBE MOD SED  07/14/2019 . IR REPLACE G-TUBE SIMPLE WO FLUORO  10/09/2019 . NO PAST SURGERIES   . TRACHEOSTOMY TUBE PLACEMENT N/A 07/09/2019  Procedure: TRACHEOSTOMY;  Surgeon: Jerrell Belfast, MD;  Location: [facility];  Service: ENT;  Laterality: N/A; . TRACHEOSTOMY TUBE PLACEMENT N/A 03/19/2020  Procedure: awake trach rigid laryngoscopy with biopsy;  Surgeon: Melida Quitter, MD;  Location: [facility];  Service: ENT;  Laterality: N/A; . VIDEO BRONCHOSCOPY Left 08/20/2019  Procedure: VIDEO BRONCHOSCOPY WITH FLUORO;  Surgeon: Chesley Mires, MD;  Location: [facility];  Service: Endoscopy;  Laterality: Left; HPI: 65 yo with laryngeal cancer diagnosed May 2021. Per MD noted, tracheostomy placed on 07/09/19 and decannulated on 08/26/19; radiation to larynx from 07/23/19 to 08/21/19. Found to have 2.7 cm mass in LLL on CT with biopsy 02/11/20 showed squamous cell carcinoma more concerning for additional lung primary rather than metastatic disease. Admitted from 03/10/20 to 03/18/20 with stridor and dyspnea and tracheostomy planned 03/17/20  > pt refused then readmitted with dyspnea, stridor and hypoxia underwent emergent tracheostomy. ENT findings: very firm/dense anterior tracheal wall.  Necrotic right hemilarynx down to glottis and involving anterior and medial pyriform sinus and with partial erosion of right edge of epiglottis. Glottis not well-visualized.  Post-cricoid hypopharynx tight and firm. Seen prior bt ST for PMV and dysphagia.  Pt underwent MBS that showed severe aspiration and he was made npo x ice.  He has been seen for PMSV trial but was unable to tolerate. Follow up for PMSV indicated.  Subjective: pleasant, cooperative Assessment / Plan / Recommendation CHL IP CLINICAL IMPRESSIONS 04/01/2020 Clinical Impression Pt continues with moderately severe pharyngeal-pharyngocervical esophageal dysphagia c/b decreased motility allowing minimal UES opening.  Suspect edema, mass and effects from radiation treatment are source of dysphagia.  Pt retains secretions in his pharynx at pyriform sinus - which mixes with barium retention.  Mild laryngeal penetration of very small boluses of liquids noted due to retention in  pyriform spilling into open larynx. Dry swalllows decrease retention and clear penetrates.  Larger bolus of liquid allowed aspiration of barium - just below vocal folds.       Recommend pt be given floor stock items of liquids and ice cream/applesauce with very strict precautions and VERY SMALL BOLUSES.  HOB must be reclined to at least 45* with pt taking very small bites/sips and conducting multiple swallows with each small bolus.   If pt is reflexively coughing, encouraged him to cough and expectorate = ceasing po intake at that time.  Will follow up briefly to assure tolerance of "comfort po".  Swallow Evaluation Recommendations SLP Visit Diagnosis Dysphagia, pharyngoesophageal phase (R13.14) Attention and concentration deficit following -- Frontal lobe and executive  function deficit following -- Impact on safety and function Moderate aspiration risk;Risk for inadequate nutrition/hydration   CHL IP TREATMENT RECOMMENDATION 04/01/2020 Treatment Recommendations Therapy as outlined in treatment plan below   Prognosis 04/01/2020 Prognosis for Safe Diet Advancement Good Barriers to Reach Goals Time post onset;Severity of deficits Barriers/Prognosis Comment -- CHL IP DIET RECOMMENDATION 04/01/2020 SLP Diet Recommendations Other (Comment) Liquid Administration via -- Medication Administration Via alternative means Compensations Slow rate;Small sips/bites;Other (Comment);Multiple dry swallows after each bite/sip Postural Changes Other (Comment)   CHL IP OTHER RECOMMENDATIONS 04/01/2020 Recommended Consults -- Oral Care Recommendations Oral care QID Other Recommendations --   CHL IP FOLLOW UP RECOMMENDATIONS 04/01/2020 Follow up Recommendations Skilled Nursing facility   [facility] IP FREQUENCY AND DURATION 04/01/2020 Speech Therapy Frequency (ACUTE ONLY) min 2x/week Treatment Duration 1 week      CHL IP ORAL PHASE 04/01/2020 Oral Phase Impaired Oral - Pudding Teaspoon -- Oral - Pudding Cup -- Oral - Honey Teaspoon -- Oral - Honey  Cup -- Oral - Nectar Teaspoon WFL Oral - Nectar Cup -- Oral - Nectar Straw WFL Oral - Thin Teaspoon WFL Oral - Thin Cup NT Oral - Thin Straw WFL Oral - Puree WFL Oral - Mech Soft WFL Oral - Regular -- Oral - Multi-Consistency -- Oral - Pill -- Oral Phase - Comment --  CHL IP PHARYNGEAL PHASE 04/01/2020 Pharyngeal Phase -- Pharyngeal- Pudding Teaspoon -- Pharyngeal -- Pharyngeal- Pudding Cup -- Pharyngeal -- Pharyngeal- Honey Teaspoon -- Pharyngeal -- Pharyngeal- Honey Cup -- Pharyngeal -- Pharyngeal- Nectar Teaspoon Pharyngeal residue - pyriform;Pharyngeal residue - valleculae;Lateral channel residue;Pharyngeal residue - cp segment;Inter-arytenoid space residue Pharyngeal Material enters airway, remains ABOVE vocal cords and not ejected out Pharyngeal- Nectar Cup NT Pharyngeal -- Pharyngeal- Nectar Straw Reduced tongue base retraction;Penetration/Aspiration during swallow;Pharyngeal residue - valleculae;Pharyngeal residue - pyriform;Pharyngeal residue - cp segment;Inter-arytenoid space residue;Lateral channel residue Pharyngeal Material enters airway, remains ABOVE vocal cords and not ejected out Pharyngeal- Thin Teaspoon Pharyngeal residue - valleculae;Pharyngeal residue - pyriform;Inter-arytenoid space residue;Lateral channel residue;Pharyngeal residue - cp segment;Pharyngeal residue - posterior pharnyx Pharyngeal -- Pharyngeal- Thin Cup NT Pharyngeal -- Pharyngeal- Thin Straw Penetration/Aspiration during swallow;Penetration/Apiration after swallow;Trace aspiration;Pharyngeal residue - pyriform;Pharyngeal residue - posterior pharnyx;Pharyngeal residue - cp segment;Lateral channel residue;Inter-arytenoid space residue;Pharyngeal residue - valleculae Pharyngeal Material enters airway, CONTACTS cords and not ejected out Pharyngeal- Puree Pharyngeal residue - cp segment;Pharyngeal residue - posterior pharnyx;Pharyngeal residue - pyriform;Pharyngeal residue - valleculae Pharyngeal Material does not enter airway  Pharyngeal- Mechanical Soft Pharyngeal residue - valleculae;Pharyngeal residue - pyriform;Pharyngeal residue - posterior pharnyx Pharyngeal -- Pharyngeal- Regular -- Pharyngeal -- Pharyngeal- Multi-consistency -- Pharyngeal -- Pharyngeal- Pill -- Pharyngeal -- Pharyngeal Comment Pt with laryngeal penetration of retention in pyriform sinus/UES region, Dry swallows (cued) push secretions/layrngeal penetration out of larynx and aid UES clearance, HOB reclined helpful to allow retention to stay in posterior pharynx, Pt with trace aspiration of thin mixed with secretions noted without pt sensation;  Pt does not sense retention consisently and needs verbal cues to swallow again.  His swallow is not functional for nutrition but is appropriate for very small sips/amounts for enjoyment.  Using live video, educated pt to recommendations and precautions.  CHL IP CERVICAL ESOPHAGEAL PHASE 04/01/2020 Cervical Esophageal Phase Impaired Pudding Teaspoon -- Pudding Cup -- Honey Teaspoon -- Honey Cup -- Nectar Teaspoon -- Nectar Cup -- Nectar Straw -- Thin Teaspoon -- Thin Cup -- Thin Straw -- Puree -- Mechanical Soft -- Regular -- Multi-consistency -- Pill -- Cervical Esophageal Comment Poor  UES opening allows severe retention in pyriform sinus, CP region Kathleen Lime, MS [facility] SLP Acute Rehab Services Office [phone] Pager [phone] [name] 04/01/2020, 11:32 AM              IR REPLACE G-TUBE COMPLEX WO FLUORO (TRACT REV)  Result Date: 04/04/2020 INDICATION: 65 year old gentleman with occluded gastrostomy tube returns today measure radiology for exchange. EXAM: Fluoroscopy guided exchange of gastrostomy tube MEDICATIONS: None ANESTHESIA/SEDATION: None CONTRAST:  10 mL of Omnipaque 300-administered into the gastric lumen. FLUOROSCOPY TIME:  Fluoroscopy Time: 2 minutes 24 seconds (14 mGy). COMPLICATIONS: None immediate. PROCEDURE: Informed written consent was obtained from the patient after a thorough discussion of the  procedural risks, benefits and alternatives. All questions were addressed. Maximal Sterile Barrier Technique was utilized including caps, mask, sterile gowns, sterile gloves, sterile drape, hand hygiene and skin antiseptic. A timeout was performed prior to the initiation of the procedure. The external segment of the occluded gastrostomy tube and surrounding skin prepped and draped in the usual fashion. The drain was clogged and could not be flushed. [place] the catheter was advanced through the occluded lumen and pulse spray technique was utilized to remove the precipitated food material. Guidewire was eventually advanced through the gastrostomy tube into the gastric lumen. The balloon was deflated and the G-tube was removed over the wire. New 18 French balloon retention gastrostomy tube was inserted over the guidewire. The balloon was inflated with 8 mL dilute contrast and the tube was retracted to the anterior gastric wall. Contrast administered through the G-tube opacifies the gastric lumen. Patient tolerated the procedure well without complication. IMPRESSION: Successful exchange of occluded gastrostomy tube. New 18 French balloon retention gastrostomy tube in place. Electronically Signed   By: Miachel Roux M.D.   On: 04/04/2020 17:11    Labs: BNP (last 3 results) Recent Labs    03/19/20 0638  BNP 24.4   Basic Metabolic Panel: Recent Labs  Lab 04/16/20 0318  NA 134*  K 4.1  CL 92*  CO2 30  GLUCOSE 132*  BUN 15  CREATININE 0.48*  CALCIUM 9.1   Liver Function Tests: No results for input(s): AST, ALT, ALKPHOS, BILITOT, PROT, ALBUMIN in the last 168 hours. No results for input(s): LIPASE, AMYLASE in the last 168 hours. No results for input(s): AMMONIA in the last 168 hours. CBC: Recent Labs  Lab 04/16/20 0318  WBC 7.9  HGB 8.5*  HCT 27.2*  MCV 74.1*  PLT 513*   Cardiac Enzymes: No results for input(s): CKTOTAL, CKMB, CKMBINDEX, TROPONINI in the last 168 hours. BNP: Invalid  input(s): POCBNP CBG: Recent Labs  Lab 04/20/20 0529 04/20/20 1229 04/20/20 1755 04/21/20 0000 04/21/20 0612  GLUCAP 146* 135* 132* 105* 145*   D-Dimer No results for input(s): DDIMER in the last 72 hours. Hgb A1c No results for input(s): HGBA1C in the last 72 hours. Lipid Profile No results for input(s): CHOL, HDL, LDLCALC, TRIG, CHOLHDL, LDLDIRECT in the last 72 hours. Thyroid function studies No results for input(s): TSH, T4TOTAL, T3FREE, THYROIDAB in the last 72 hours.  Invalid input(s): FREET3 Anemia work up No results for input(s): VITAMINB12, FOLATE, FERRITIN, TIBC, IRON, RETICCTPCT in the last 72 hours. Urinalysis    Component Value Date/Time   COLORURINE YELLOW 03/10/2020 1400   APPEARANCEUR HAZY (A) 03/10/2020 1400   LABSPEC 1.018 03/10/2020 1400   PHURINE 6.0 03/10/2020 1400   GLUCOSEU NEGATIVE 03/10/2020 1400   HGBUR NEGATIVE 03/10/2020 [place] 03/10/2020 1400   KETONESUR  NEGATIVE 03/10/2020 1400   PROTEINUR NEGATIVE 03/10/2020 1400   UROBILINOGEN 1.0 10/28/2008 0458   NITRITE POSITIVE (A) 03/10/2020 1400   LEUKOCYTESUR TRACE (A) 03/10/2020 1400   Sepsis Labs Invalid input(s): PROCALCITONIN,  WBC,  LACTICIDVEN Microbiology Recent Results (from the past 240 hour(s))  SARS CORONAVIRUS 2 (TAT 6-24 HRS) Nasopharyngeal Nasopharyngeal Swab     Status: None   Collection Time: 04/20/20  5:39 PM   Specimen: Nasopharyngeal Swab  Result Value Ref Range Status   SARS Coronavirus 2 NEGATIVE NEGATIVE Final    Comment: (NOTE) SARS-CoV-2 target nucleic acids are NOT DETECTED.  The SARS-CoV-2 RNA is generally detectable in upper and lower respiratory specimens during the acute phase of infection. Negative results do not preclude SARS-CoV-2 infection, do not rule out co-infections with other pathogens, and should not be used as the sole basis for treatment or other patient management decisions. Negative results must be combined with clinical  observations, patient history, and epidemiological information. The expected result is Negative.  Fact Sheet for Patients: SugarRoll.be  Fact Sheet for Healthcare Providers: https://www.woods-mathews.com/  This test is not yet approved or cleared by the [place] FDA and  has been authorized for detection and/or diagnosis of SARS-CoV-2 by FDA under an Emergency Use Authorization (EUA). This EUA will remain  in effect (meaning this test can be used) for the duration of the COVID-19 declaration under Se ction 564(b)(1) of the Act, 21 U.S.C. section 360bbb-3(b)(1), unless the authorization is terminated or revoked sooner.  Performed at [facility], [place] [address], [place], [place] [address]      Time coordinating discharge: 35 minutes  SIGNED: Antonieta Pert, MD  [facility] 04/21/2020, 10:49 AM  If 7PM-7AM, please contact night-coverage [email or web address]

## 2020-04-20 NOTE — Progress Notes (Signed)
   ENT Progress Note: s/p Procedure(s): awake trach rigid laryngoscopy with biopsy   Subjective: Patient stable  Objective: Vital signs in last 24 hours: Temp:  [98.5 F (36.9 C)-99 F (37.2 C)] 99 F (37.2 C) (02/16 1348) Pulse Rate:  [95-105] 97 (02/16 1348) Resp:  [16-18] 18 (02/16 1348) BP: (107-117)/(73) 108/73 (02/16 1348) SpO2:  [98 %-100 %] 99 % (02/16 1536) FiO2 (%):  [28 %] 28 % (02/16 1536) Weight:  [49.4 kg] 49.4 kg (02/16 0345) Weight change: 0 kg Last BM Date: 04/17/20  Intake/Output from previous day: 02/15 0701 - 02/16 0700 In: 1080 [NG/GT:1080] Out: 800 [Urine:800] Intake/Output this shift: Total I/O In: -  Out: 400 [Urine:400]  Labs: No results for input(s): WBC, HGB, HCT, PLT in the last 72 hours. No results for input(s): NA, K, CL, CO2, GLUCOSE, BUN, CALCIUM in the last 72 hours.  Invalid input(s): CREATININR  Studies/Results: No results found.   PHYSICAL EXAM: Moderate crusting around tracheostomy tube, flexible transtracheoscopy bronchoscopy performed.  Distal airway appears normal.  Patient has crusting in the lumen of his tracheostomy tube without an inner cannula.  #4 Shiley tracheostomy tube changed without difficulty at the bedside.  Patient's airway stable, no further crusting or concerns.   Assessment/Plan: Patient stable after tracheostomy tube change.  Airway intact, no active issues.  Plan discharge to nursing care facility as scheduled tomorrow.  The patient has significant health issues at this time: Recurrent laryngeal carcinoma with obstructing tumor, second primary involving the lung, severe cachexia.  He is not a candidate for further radiation or chemotherapy to either the lung or the larynx.  Given his severe malnourishment/cachexia I do not think he would heal from a total laryngectomy.  I would not recommend any surgical intervention, continue current airway management and plan hospice/comfort care.    Jerrell Belfast 04/20/2020, 5:52 PM

## 2020-04-20 NOTE — Progress Notes (Signed)
RT received phone call from Dr. Maren Beach. MD getting in touch with ENT regarding trach.

## 2020-04-20 NOTE — Progress Notes (Signed)
RT note- RT reached out to RN regarding pt trach. RN verified ENT came and switched out pt trach.

## 2020-04-20 NOTE — Progress Notes (Signed)
PROGRESS NOTE    Devon Peterson  [id number] DOB: [birth year] DOA: 03/19/2020 PCP: Patient, No Pcp Per   Chief Complaint  Patient presents with  . Respiratory Distress  Brief Narrative: 65 year old male with history of COPD, cocaine abuse, tobacco abuse, history of laryngeal cancer and previous tracheostomy discharged with radiation therapy, squamous cell lung cancer, A. fib, aspiration pneumonia presented to the ED on 1/15 with shortness of breath.  As per the report May 2021 diagnosed with laryngeal lung cancer underwent tracheostomy. 07/23/2019 -08/21/2019 , patient underwent radiation to larynx.  08/26/2019, tracheostomy was decannulated. Also in May 2021, CT scan of chest showed 2.7 cm mass in the left lower lobe. Bronchoscopy was nondiagnostic. 02/11/2020, CT-guided biopsy of LLL mass showed squamous cell carcinoma. Underwent 4 sessions of radiation till 03/15/2020 03/10/2020-03/18/2020, patient was admitted for stridor and dyspnea. He was seen by ENT and recommended tracheostomy which he refused and was since discharged to SNF. Patient presented to the ED 1/15 with dyspnea,hypoxia and underwent tracheostomy placement. 03/18/2020-oncology Dr. Benay Spice recommended hospice. 1/30, PEG tube clogged. Tube feeding held. 1/31,PEG tube replaced by IR. Tube feeding resumed. Tolerating tubefeed At this time patient is awaiting on placement due to new tracheostomy status   Subjective: Seen/examined.  No new complaints.  Is resting comfortably, he is on trach collar he is tolerating tube feed via PEG tube.  Assessment & Plan:  Acute on chronic respiratory failure with hypoxia, s/p emergent tracheostomy by ENT.  Tolerating trach collar continue the same continue outpatient follow-up with ENTw/ Dr Wilburn Cornelia.   Goals of care/counseling/discussion: Previously had an extensive discussion with patient and family and patient remains full code, patient was interested in home hospice but unable to go  home due to poor home condition and plan is for discharge to skilled nursing facility and treat what is treatable and follow-up and establish care with palliative care as outpatient.  SCC lung, underwent radiation, as per the his oncology note patient is a poor candidate for any systemic chemotherapy, hospice was recommended by his oncologist.  History of laryngeal cancer and previous tracheostomy/XRT,Continue trach care as #1  Sepsis/aspiration pneumonia POA resolved and patient finished antibiotics.    Normocytic anemia hemoglobin chronically low but stable.  No evidence of acute bleeding.    PAF HX, EKG 1/15 showed sinus tachycardia, is not on anticoagulation and currently not needing rate controlling agents.   S/P Peg, replaced by IR on 1/31, tolerating tube feeds, cont same.  COPD not in exacerbation-continue inhalers  Laryngeal stridor:resolved on 2/3  Radiation pneumonitis resolved.  Complete antibiotics  Severe protein calorie malnutrition w/ bmi 16, appreciate dietitian input.  We will continue to increase and provide nutritional supplement to increase calorie intake   Nutrition: Diet Order            Diet NPO time specified Except for: Ice Chips  Diet effective now                 Nutrition Problem: Severe Malnutrition Etiology: chronic illness,cancer and cancer related treatments Signs/Symptoms: severe fat depletion,severe muscle depletion Interventions: Tube feeding,Prostat Pt's Body mass index is 15.2 kg/m.  DVT prophylaxis: enoxaparin (LOVENOX) injection 40 mg Start: 03/21/20 1600 Place and maintain sequential compression device Start: 03/19/20 0810 Code Status:   Code Status: Full Code  Family Communication: plan of care discussed with patient at bedside. Discussed with the nursing staff. Status is: Inpatient Remains inpatient appropriate because:Unsafe d/c plan and Inpatient level of care appropriate due to severity of illness  Dispo: The patient is from:  [facility]              Anticipated d/c is to: SNF once bed available              Anticipated d/c date is: once bed available. >30 days post trach.  TOC aware and working on it.              Patient currently is medically stable to d/c.   Difficult to place patient Yes  Consultants:see note  Procedures:see note  Culture/Microbiology    Component Value Date/Time   SDES  03/19/2020 0700    BLOOD RIGHT ANTECUBITAL Performed at [facility], [place] [address], [place], [place] [address]    [place]  03/19/2020 0700    BOTTLES DRAWN AEROBIC AND ANAEROBIC Blood Culture results may not be optimal due to an excessive volume of blood received in culture bottles Performed at [facility], [place] [address] [place], [place] [address]    CULT  03/19/2020 0700    NO GROWTH 5 DAYS Performed at [place] [address] [place], [place] [address]    REPTSTATUS 03/24/2020 FINAL 03/19/2020 0700    Other culture-see note  Medications: Scheduled Meds: . chlorhexidine  15 mL Mouth Rinse BID  . enoxaparin (LOVENOX) injection  40 mg Subcutaneous Q24H  . feeding supplement (PROSource TF)  45 mL Per Tube BID  . fentaNYL  1 patch Transdermal Q72H  . free water  100 mL Per Tube Q6H  . magnesium oxide  800 mg Per Tube Daily  . mouth rinse  15 mL Mouth Rinse q12n4p  . pantoprazole sodium  40 mg Per Tube q morning  . polyethylene glycol  17 g Per Tube Daily  . senna  2 tablet Per Tube Daily  . sertraline  50 mg Per Tube Daily  . thiamine  100 mg Per Tube Daily   Continuous Infusions: . sodium chloride Stopped (04/15/20 1405)  . feeding supplement (JEVITY 1.5 CAL/FIBER) 1,000 mL (04/20/20 0813)    Antimicrobials: Anti-infectives (From admission, onward)   Start     Dose/Rate Route Frequency Ordered Stop   03/19/20 1213  piperacillin-tazobactam (ZOSYN) 3.375 GM/50ML IVPB       Note to Pharmacy: Estell Harpin   : cabinet override      03/19/20  1213 03/20/20 0014   03/19/20 1000  piperacillin-tazobactam (ZOSYN) IVPB 3.375 g  Status:  Discontinued        3.375 g 12.5 mL/hr over 240 Minutes Intravenous Every 8 hours 03/19/20 0852 03/23/20 1300     Objective: Vitals: Today's Vitals   04/20/20 0345 04/20/20 0427 04/20/20 0532 04/20/20 0812  BP:   117/73   Pulse:   (!) 105   Resp:   16   Temp:   98.5 F (36.9 C)   TempSrc:   Oral   SpO2:   100% 98%  Weight: 49.4 kg     Height:      PainSc:  Asleep      Intake/Output Summary (Last 24 hours) at 04/20/2020 1018 Last data filed at 04/20/2020 0600 Gross per 24 hour  Intake 1080 ml  Output 800 ml  Net 280 ml   Filed Weights   04/18/20 0500 04/19/20 0500 04/20/20 0345  Weight: 49.4 kg 49.4 kg 49.4 kg   Weight change: 0 kg  Intake/Output from previous day: 02/15 0701 - 02/16 0700 In: 1080 [NG/GT:1080] Out: 800 [Urine:800] Intake/Output this  shift: No intake/output data recorded. Filed Weights   04/18/20 0500 04/19/20 0500 04/20/20 0345  Weight: 49.4 kg 49.4 kg 49.4 kg    Examination:  General exam: AAO, not verbalizing but following commands appropriately. HEENT:Oral mucosa moist, Ear/Nose WNL grossly, dentition normal. Respiratory system: bilaterally clear, trach collar present at 5 L,,no use of accessory muscle Cardiovascular system: S1 & S2 +, No JVD. Gastrointestinal system: Abdomen soft, NT, PEG tube present with an early case drainage, ND, BS+ Nervous System:Alert, awake, moving extremities and grossly nonfocal Extremities: No edema, distal peripheral pulses palpable.  Skin: No rashes,no icterus. MSK: Thin muscle bulk and low tone  Data Reviewed: I have personally reviewed following labs and imaging studies CBC: Recent Labs  Lab 04/16/20 0318  WBC 7.9  HGB 8.5*  HCT 27.2*  MCV 74.1*  PLT 542*   Basic Metabolic Panel: Recent Labs  Lab 04/14/20 0317 04/16/20 0318  NA 135 134*  K 4.1 4.1  CL 92* 92*  CO2 28 30  GLUCOSE 149* 132*  BUN 15 15   CREATININE 0.41* 0.48*  CALCIUM 9.0 9.1  PHOS 2.9  --    GFR: Estimated Creatinine Clearance: 65.2 mL/min (A) (by C-G formula based on SCr of 0.48 mg/dL (L)). Liver Function Tests: No results for input(s): AST, ALT, ALKPHOS, BILITOT, PROT, ALBUMIN in the last 168 hours. No results for input(s): LIPASE, AMYLASE in the last 168 hours. No results for input(s): AMMONIA in the last 168 hours. Coagulation Profile: No results for input(s): INR, PROTIME in the last 168 hours. Cardiac Enzymes: No results for input(s): CKTOTAL, CKMB, CKMBINDEX, TROPONINI in the last 168 hours. BNP (last 3 results) No results for input(s): PROBNP in the last 8760 hours. HbA1C: No results for input(s): HGBA1C in the last 72 hours. CBG: Recent Labs  Lab 04/19/20 0527 04/19/20 1229 04/19/20 1814 04/20/20 0027 04/20/20 0529  GLUCAP 138* 119* 146* 128* 146*   Lipid Profile: No results for input(s): CHOL, HDL, LDLCALC, TRIG, CHOLHDL, LDLDIRECT in the last 72 hours. Thyroid Function Tests: No results for input(s): TSH, T4TOTAL, FREET4, T3FREE, THYROIDAB in the last 72 hours. Anemia Panel: No results for input(s): VITAMINB12, FOLATE, FERRITIN, TIBC, IRON, RETICCTPCT in the last 72 hours. Sepsis Labs: No results for input(s): PROCALCITON, LATICACIDVEN in the last 168 hours.  No results found for this or any previous visit (from the past 240 hour(s)).   Radiology Studies: No results found.   LOS: 32 days   Antonieta Pert, MD [facility]  04/20/2020, 10:18 AM

## 2020-04-20 NOTE — TOC Progression Note (Signed)
Transition of Care [facility]) - Progression Note    Patient Details  Name: Devon Peterson MRN: [id number] Date of Birth: 08/31/1955  Transition [facility]) CM/SW Contact  Lennart Pall, LCSW Phone Number: 04/20/2020, 4:35 PM  Clinical Narrative:    Have received SNF bed offer from [facility] in [facility] who can admit patient tomorrow.  Pt and sister aware and accepting bed offer.  Have alerted MD to place order for COVID test.     Expected Discharge Plan: [place] Barriers to Discharge: No SNF bed  Expected Discharge Plan and Services Expected Discharge Plan: [place] In-house Referral: Clinical Social Work     Living arrangements for the past 2 months: [place] ([place] since 08/2019) Expected Discharge Date:  (unknown)               DME Arranged: N/A DME Agency: NA                   Social Determinants of Health ([place]) Interventions    Readmission Risk Interventions Readmission Risk Prevention Plan 07/27/2019  Transportation Screening Complete  HRI or [place] Complete  Social Work Consult for [place] Planning/Counseling Complete  Palliative Care Screening Complete  Medication Review Press photographer) Complete  Some recent data might be hidden

## 2020-04-20 NOTE — Progress Notes (Signed)
Informed by respiratory that,RT could not pass a suction catheter or get an inner cannula and trach, patient complaining of feeling something in his airway, some wheezing present but he is not in any acute distress saturating well on 5L a 99-100%.I checked him couple of times this evening.I called Dr. Wilburn Cornelia and discussed and he is coming over from the office to address  Trach.Dr. Redmond Baseman had changed his trach out 03/23/2020 #4 Shiley.In the meantime we will be monitoring very closely in case he needs to move to ICU/step down. I have left my cell phone to Dr. Wilburn Cornelia as well.  Low threshold for transfer to stepdown. Charge nurse has been made aware of.   I have spoken to his sister, about these changes- not sure if it is tumor or the issues with trach or mucus.She tells me that she is aware about recommendation for hospice by the oncologist and she is aware that there is nothing that can be done about his tumor at this time and she states that the patient has expressed wishes to stay full code.  She would like update from ENT after evaluation this evening.

## 2020-04-20 NOTE — Progress Notes (Signed)
RT can not pass suction catheter or put inner cannula in tach. Pt stating he feels like something is in his airway. Pt is in no distress and vitals are normal. RT notified RN and MD. Per chart ENT changed his trach out on 03/23/20.

## 2020-04-21 LAB — SARS CORONAVIRUS 2 (TAT 6-24 HRS): SARS Coronavirus 2: NEGATIVE

## 2020-04-21 LAB — GLUCOSE, CAPILLARY
Glucose-Capillary: 105 mg/dL — ABNORMAL HIGH (ref 70–99)
Glucose-Capillary: 117 mg/dL — ABNORMAL HIGH (ref 70–99)
Glucose-Capillary: 145 mg/dL — ABNORMAL HIGH (ref 70–99)

## 2020-04-21 MED ORDER — PROSOURCE TF PO LIQD: 45.0000 mL | Freq: Two times a day (BID) | ORAL | Status: AC

## 2020-04-21 MED ORDER — SERTRALINE HCL 50 MG PO TABS: 50.0000 mg | ORAL_TABLET | Freq: Every day | ORAL | Status: AC

## 2020-04-21 MED ORDER — SENNA 8.6 MG PO TABS: 2.0000 | ORAL_TABLET | Freq: Every day | ORAL | 0 refills | Status: AC

## 2020-04-21 MED ORDER — POLYETHYLENE GLYCOL 3350 17 G PO PACK: 17.0000 g | PACK | Freq: Every day | ORAL | 0 refills | Status: AC

## 2020-04-21 MED ORDER — THIAMINE HCL 100 MG PO TABS
100.0000 mg | ORAL_TABLET | Freq: Every day | ORAL | Status: AC
Start: 2020-04-21 — End: ?

## 2020-04-21 MED ORDER — PANTOPRAZOLE SODIUM 40 MG PO PACK: 40.0000 mg | PACK | Freq: Every morning | ORAL | Status: AC

## 2020-04-21 MED ORDER — MAGNESIUM OXIDE 400 (241.3 MG) MG PO TABS: 800.0000 mg | ORAL_TABLET | Freq: Every day | ORAL | Status: AC

## 2020-04-21 MED ORDER — OXYCODONE HCL 5 MG/5ML PO SOLN: 5.0000 mg | Freq: Four times a day (QID) | ORAL | 0 refills | Status: AC | PRN

## 2020-04-21 MED ORDER — JEVITY 1.5 CAL/FIBER PO LIQD: 1000.0000 mL | ORAL | Status: AC

## 2020-04-21 MED ORDER — FREE WATER: 100.0000 mL | Freq: Four times a day (QID) | Status: AC

## 2020-04-21 NOTE — TOC Transition Note (Signed)
Transition of Care [facility]) - CM/SW Discharge Note   Patient Details  Name: Devon Peterson MRN: [id number] Date of Birth: [birth year]  Transition of Care [facility]) CM/SW Contact:  Lennart Pall, LCSW Phone Number: 04/21/2020, 11:30 AM   Clinical Narrative:    Pt medically cleared for SNF transfer today to [facility] in [place].  [facility] has been called.  RN to call report to [phone].  Pt and family aware/ agreed.  No further TOC needs.   Final next level of care: Skilled Nursing Facility Barriers to Discharge: Barriers Resolved   Patient Goals and CMS Choice Patient states their goals for this hospitalization and ongoing recovery are:: famiy would like for him to return to SNF if at all possible      Discharge Placement              Patient chooses bed at:  ([facility]) Patient to be transferred to facility by: [place] Name of family member notified: sister, [name] @ [phone] Patient and family notified of of transfer: 04/21/20  Discharge Plan and Services In-house Referral: Clinical Social Work              DME Arranged: N/A DME Agency: NA                  Social Determinants of Health ([place]) Interventions     Readmission Risk Interventions Readmission Risk Prevention Plan 07/27/2019  Transportation Screening Complete  HRI or Home Care Consult Complete  Social Work Consult for [place] Planning/Counseling Complete  Palliative Care Screening Complete  Medication Review Press photographer) Complete  Some recent data might be hidden

## 2020-04-21 NOTE — Plan of Care (Signed)
Plan of care reviewed.  Continue current plan.

## 2020-04-21 NOTE — Progress Notes (Signed)
Report called to receiving SNF: [facility] at 1500. Spoke with Ladona Mow, LPN.   All questions answered.   Patient transported via [facility] to SNF.    [place], Therapist, sports

## 2020-04-21 NOTE — Progress Notes (Signed)
[employer]ufact[employer] [facility]) Community Based Palliative Care       This patient has been referred to our palliative care services in the community.  [facility] will continue to follow for any discharge planning needs and to coordinate admission onto palliative care.    Thank you for the opportunity to participate in this patient's care.     Domenic Moras, BSN, RN [facility] Psychiatric Liaison   [phone] (24h on call)

## 2020-07-03 DEATH — deceased

## 2020-09-28 NOTE — Progress Notes (Signed)
  Radiation Oncology         [phone] ________________________________  Name: Devon Peterson MRN: [id number]  Date: 03/21/2020  DOB: Sep 30, 1955  End of Treatment Note  Diagnosis:   Very large left lower lung mass; biopsied and showed squamous cell carcinoma - is unclear if this is metastatic or primary  Indication for treatment:  palliative       Radiation treatment dates:  03/02/20 to 03-18-20  Site/dose:   Left Lower lung / received 10 Gy in 2 fractions followed by 20 Gy in 5 fractions.  Technique:   IMRT  Narrative: The patient demonstrated a decline in his status, prompting RT to stop prematurely.  He remains inpatient and hospice is anticipated.  Plan: The patient has completed radiation treatment. The patient will return to radiation oncology clinic for routine followup PRN, pending hospice disposition.  -----------------------------------  Eppie Gibson, MD

## 2021-09-13 IMAGING — CT CT ABD-PELV W/ CM
2 of 5 series · 15 of 46 positions shown, 17 images · IV contrast (omnipaque)
Comparison: CT chest 07/07/2019

CLINICAL DATA: Lymphoma staging.

EXAM:
CT ABDOMEN AND PELVIS WITH CONTRAST
TECHNIQUE: Multidetector CT imaging of the abdomen and pelvis was performed
using the standard protocol following bolus administration of
intravenous contrast.
CONTRAST:  80mL OMNIPAQUE IOHEXOL 300 MG/ML  SOLN

[Series 2: axial st · axial · 0.72mm/px · z∈[+816,+1186]mm · 12 of 88 slices shown, 14 images]
[im 7/88  soft-tissue]
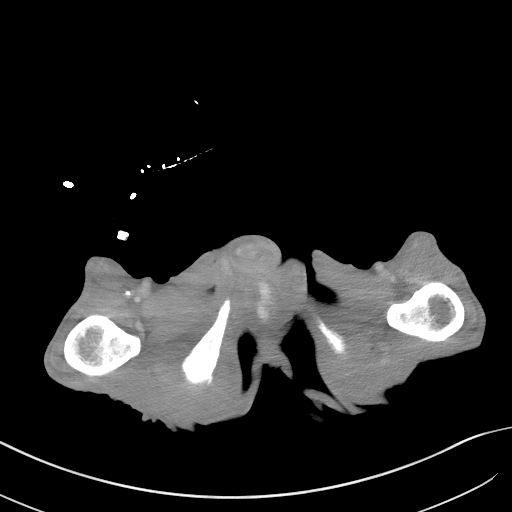
[im 7/88  bone]
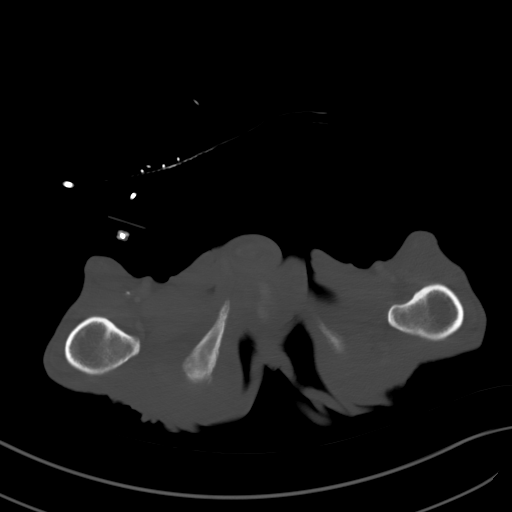
[im 14/88  soft-tissue]
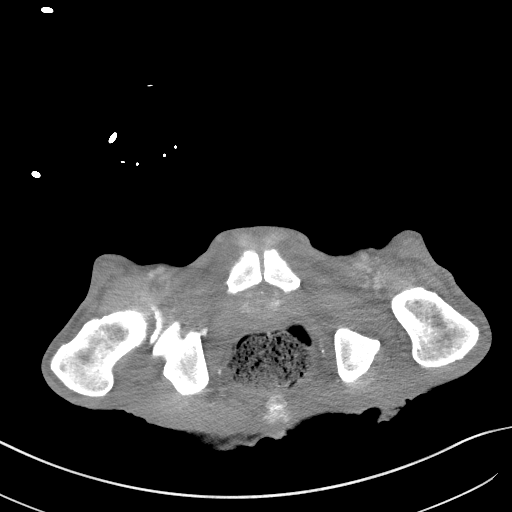
[im 21/88  soft-tissue]
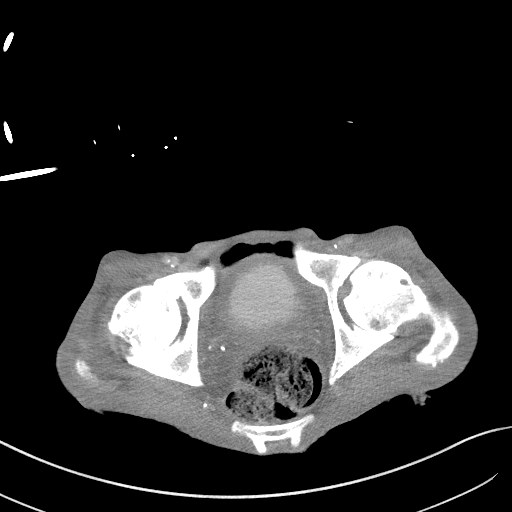
[im 27/88  soft-tissue]
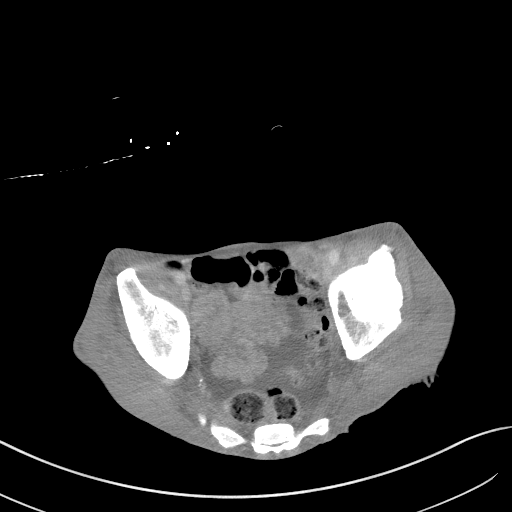
[im 34/88  soft-tissue]
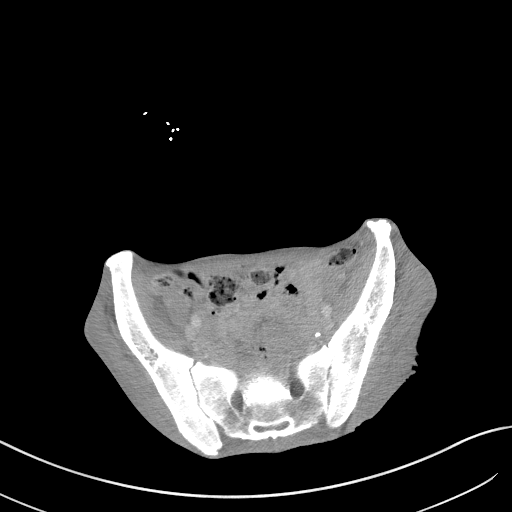
[im 41/88  soft-tissue]
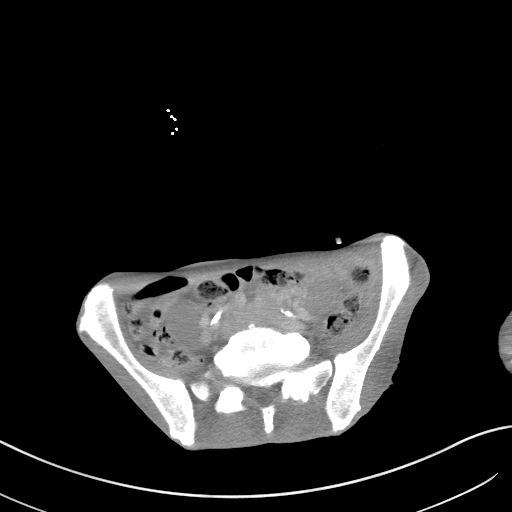
[im 47/88  soft-tissue]
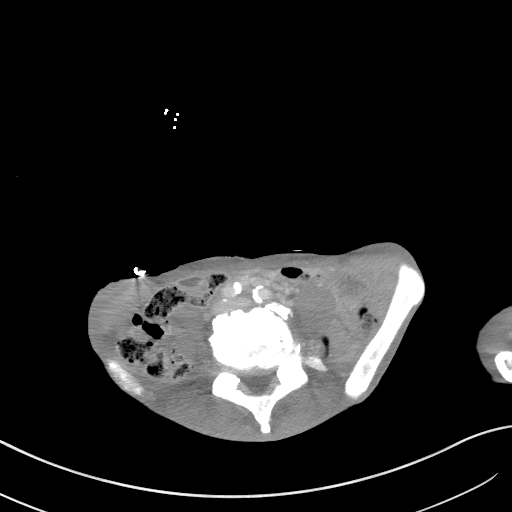
[im 54/88  soft-tissue]
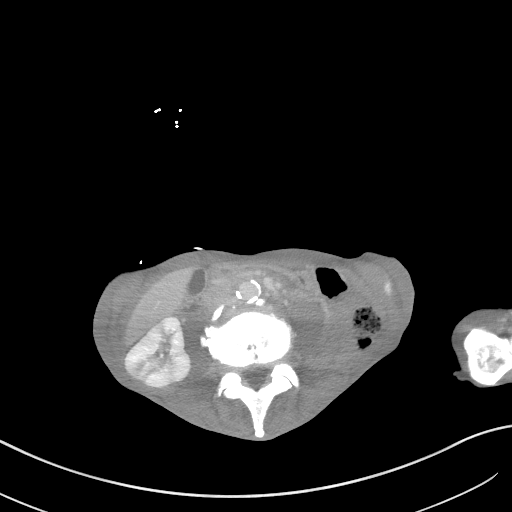
[im 61/88  soft-tissue]
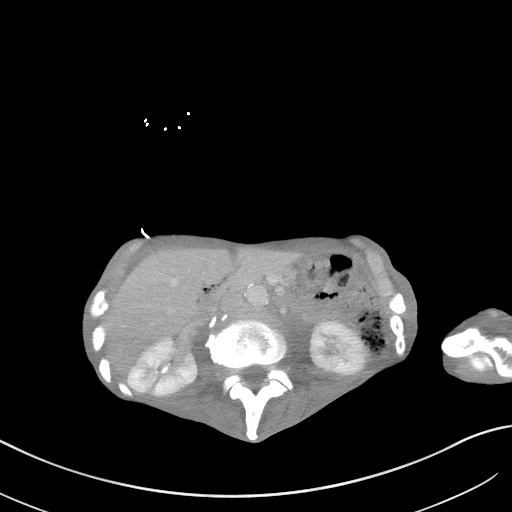
[im 61/88  bone]
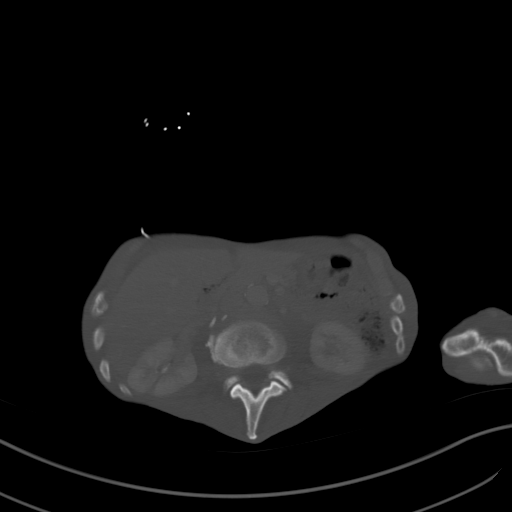
[im 67/88  soft-tissue]
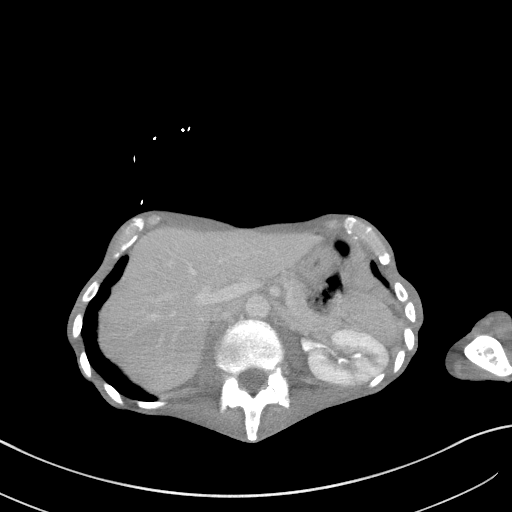
[im 74/88  soft-tissue]
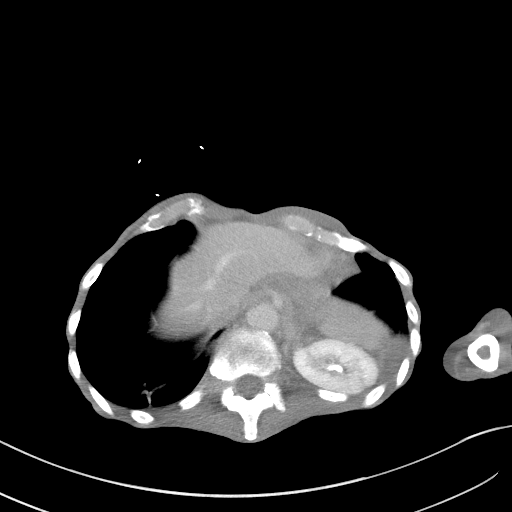
[im 81/88  soft-tissue]
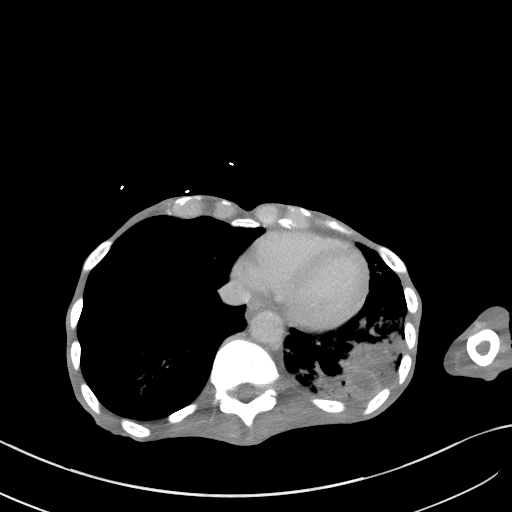

[Series 6: coronal st · coronal · 0.58mm/px · 3 of 63 slices shown]
[im 21/63  soft-tissue]
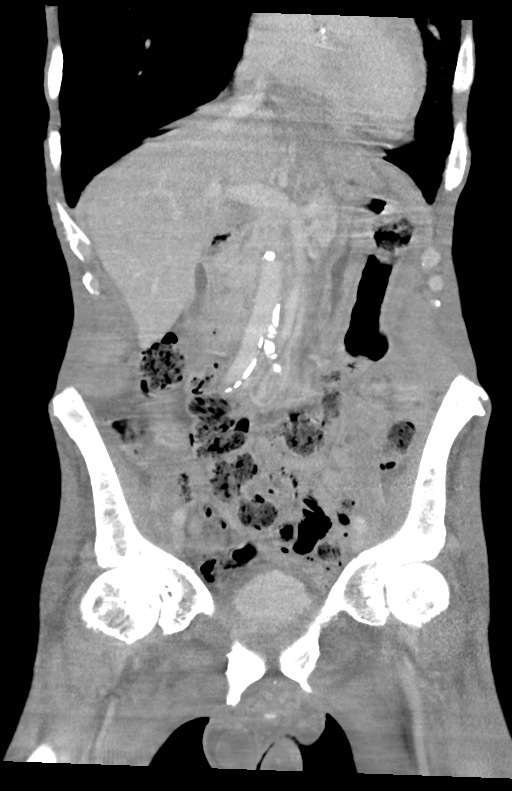
[im 28/63  soft-tissue]
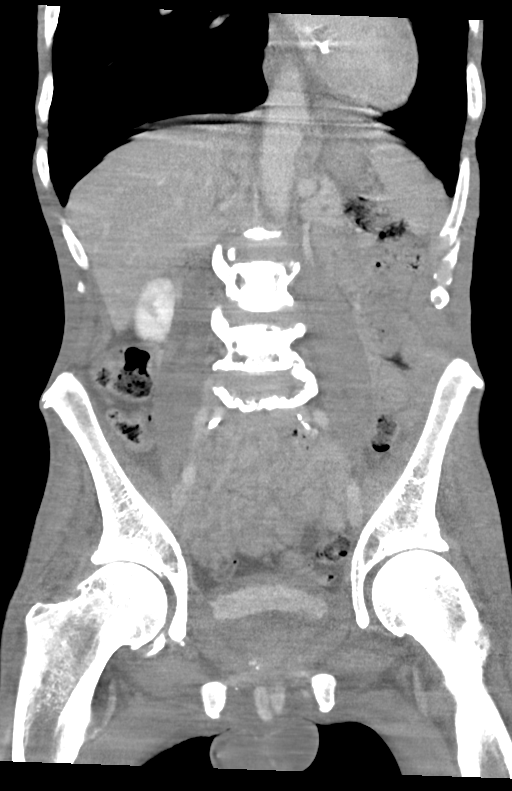
[im 35/63  soft-tissue]
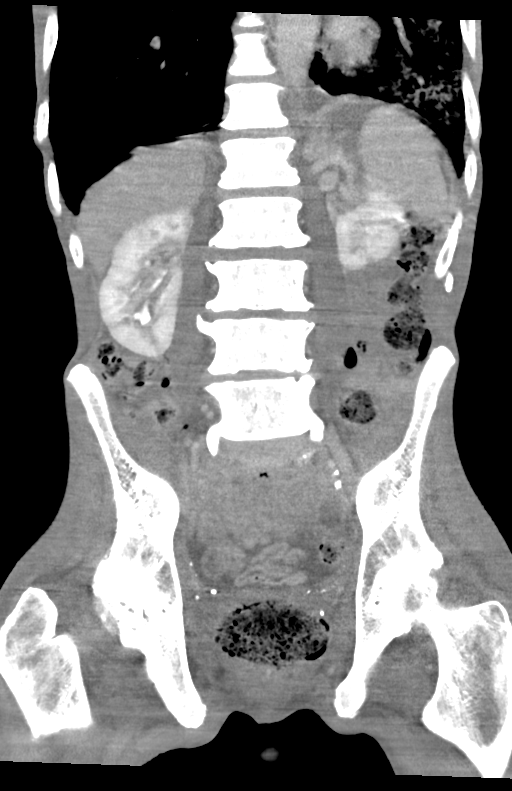

[15 of 46 positions shown; findings below may reference images not displayed]

FINDINGS: Lower chest: Lung bases demonstrate interval worsening bibasilar
consolidation left much worse than right. Rim enhancing low-density
structure within the dense consolidation in the left base without
significant change which could represent underlying malignancy
versus necrotic infection or pulmonary abscess. Calcification of the
mitral valve annulus.

Hepatobiliary: Liver, gallbladder and biliary tree are normal.

Pancreas: Normal.

Spleen: Normal.

Adrenals/Urinary Tract: Adrenal glands are unremarkable. Kidneys are
normal in size and demonstrate bilateral streaky cortical
low-attenuation which can be seen in pyelonephritis. No evidence of
hydronephrosis. No definite Peri nephric inflammation or fluid.
Ureters are not well visualized. Contrast is present within the
bladder from patient's recent chest CT.

Stomach/Bowel: Stomach and small bowel are unremarkable. Appendix is
not well visualized. Colon is unremarkable. Moderate fecal retention
over the rectum.

Vascular/Lymphatic: Mild calcified plaque over the abdominal aorta
which is normal caliber. No definite adenopathy.

Reproductive: Normal.

Other: Evidence of cachexia with significant decrease mesenteric fat
and subcutaneous fat present.

Musculoskeletal: Degenerative change of the spine and hips.
IMPRESSION: 1. Interval worsening of bibasilar airspace consolidation left base
worse than right likely due to pneumonia. Stable rim enhancing
low-density focus over the left base consolidation which may be due
to necrosis, pulmonary abscess or underlying malignancy.

2.  No evidence of metastatic disease within the abdomen.

3. Bilateral streaky cortical low-attenuation over the kidneys which
can be seen in pyelonephritis. Recommend clinical correlation.

4.  Aortic Atherosclerosis ([id number]-[id number].W).

5. Findings compatible patient's clinical cachexia with significant
decreased subcutaneous and mesenteric fat.
